# Patient Record
Sex: Female | Born: 1945 | Race: White | Hispanic: No | Marital: Married | Smoking: Never smoker
Health system: Southern US, Community
[De-identification: ages and names within clinical notes are randomized; demographics above are authoritative.]

## PROBLEM LIST (undated history)

## (undated) DIAGNOSIS — F32A Depression, unspecified: Secondary | ICD-10-CM

## (undated) DIAGNOSIS — Z5189 Encounter for other specified aftercare: Secondary | ICD-10-CM

## (undated) DIAGNOSIS — G4733 Obstructive sleep apnea (adult) (pediatric): Secondary | ICD-10-CM

## (undated) DIAGNOSIS — T7840XA Allergy, unspecified, initial encounter: Secondary | ICD-10-CM

## (undated) DIAGNOSIS — K219 Gastro-esophageal reflux disease without esophagitis: Secondary | ICD-10-CM

## (undated) DIAGNOSIS — L910 Hypertrophic scar: Secondary | ICD-10-CM

## (undated) DIAGNOSIS — I480 Paroxysmal atrial fibrillation: Secondary | ICD-10-CM

## (undated) DIAGNOSIS — E669 Obesity, unspecified: Secondary | ICD-10-CM

## (undated) DIAGNOSIS — I2510 Atherosclerotic heart disease of native coronary artery without angina pectoris: Secondary | ICD-10-CM

## (undated) DIAGNOSIS — I10 Essential (primary) hypertension: Secondary | ICD-10-CM

## (undated) DIAGNOSIS — G4730 Sleep apnea, unspecified: Secondary | ICD-10-CM

## (undated) DIAGNOSIS — E785 Hyperlipidemia, unspecified: Secondary | ICD-10-CM

## (undated) DIAGNOSIS — K5010 Crohn's disease of large intestine without complications: Secondary | ICD-10-CM

## (undated) DIAGNOSIS — K5190 Ulcerative colitis, unspecified, without complications: Secondary | ICD-10-CM

## (undated) DIAGNOSIS — D3000 Benign neoplasm of unspecified kidney: Secondary | ICD-10-CM

## (undated) DIAGNOSIS — K859 Acute pancreatitis without necrosis or infection, unspecified: Secondary | ICD-10-CM

## (undated) DIAGNOSIS — F419 Anxiety disorder, unspecified: Secondary | ICD-10-CM

## (undated) DIAGNOSIS — M1990 Unspecified osteoarthritis, unspecified site: Secondary | ICD-10-CM

## (undated) DIAGNOSIS — K5790 Diverticulosis of intestine, part unspecified, without perforation or abscess without bleeding: Secondary | ICD-10-CM

## (undated) DIAGNOSIS — F329 Major depressive disorder, single episode, unspecified: Secondary | ICD-10-CM

## (undated) DIAGNOSIS — K589 Irritable bowel syndrome without diarrhea: Secondary | ICD-10-CM

## (undated) DIAGNOSIS — I724 Aneurysm of artery of lower extremity: Secondary | ICD-10-CM

## (undated) DIAGNOSIS — M069 Rheumatoid arthritis, unspecified: Secondary | ICD-10-CM

## (undated) HISTORY — DX: Crohn's disease of large intestine without complications: K50.10

## (undated) HISTORY — PX: CORONARY ANGIOPLASTY: SHX604

## (undated) HISTORY — DX: Essential (primary) hypertension: I10

## (undated) HISTORY — DX: Ulcerative colitis, unspecified, without complications: K51.90

## (undated) HISTORY — PX: ESOPHAGOGASTRODUODENOSCOPY: SHX1529

## (undated) HISTORY — DX: Irritable bowel syndrome, unspecified: K58.9

## (undated) HISTORY — DX: Diverticulosis of intestine, part unspecified, without perforation or abscess without bleeding: K57.90

## (undated) HISTORY — DX: Obesity, unspecified: E66.9

## (undated) HISTORY — DX: Hypertrophic scar: L91.0

## (undated) HISTORY — DX: Depression, unspecified: F32.A

## (undated) HISTORY — DX: Rheumatoid arthritis, unspecified: M06.9

## (undated) HISTORY — DX: Paroxysmal atrial fibrillation: I48.0

## (undated) HISTORY — DX: Benign neoplasm of unspecified kidney: D30.00

## (undated) HISTORY — DX: Gastro-esophageal reflux disease without esophagitis: K21.9

## (undated) HISTORY — PX: ANGIOPLASTY: SHX39

## (undated) HISTORY — DX: Unspecified osteoarthritis, unspecified site: M19.90

## (undated) HISTORY — DX: Hyperlipidemia, unspecified: E78.5

## (undated) HISTORY — PX: TUBAL LIGATION: SHX77

## (undated) HISTORY — DX: Allergy, unspecified, initial encounter: T78.40XA

## (undated) HISTORY — DX: Acute pancreatitis without necrosis or infection, unspecified: K85.90

## (undated) HISTORY — DX: Sleep apnea, unspecified: G47.30

## (undated) HISTORY — DX: Atherosclerotic heart disease of native coronary artery without angina pectoris: I25.10

## (undated) HISTORY — DX: Encounter for other specified aftercare: Z51.89

## (undated) HISTORY — PX: COLONOSCOPY W/ BIOPSIES: SHX1374

## (undated) HISTORY — DX: Anxiety disorder, unspecified: F41.9

## (undated) HISTORY — DX: Obstructive sleep apnea (adult) (pediatric): G47.33

## (undated) HISTORY — DX: Major depressive disorder, single episode, unspecified: F32.9

## (undated) HISTORY — DX: Aneurysm of artery of lower extremity: I72.4

---

## 1974-10-24 HISTORY — PX: OTHER SURGICAL HISTORY: SHX169

## 1994-10-24 HISTORY — PX: BREAST BIOPSY: SHX20

## 2006-10-24 DIAGNOSIS — I4891 Unspecified atrial fibrillation: Secondary | ICD-10-CM

## 2006-10-24 HISTORY — DX: Unspecified atrial fibrillation: I48.91

## 2006-10-24 HISTORY — PX: NEPHRECTOMY: SHX65

## 2006-10-24 HISTORY — PX: CHOLECYSTECTOMY: SHX55

## 2007-01-04 ENCOUNTER — Encounter: Payer: Self-pay | Admitting: Pulmonary Disease

## 2007-01-29 ENCOUNTER — Inpatient Hospital Stay (HOSPITAL_COMMUNITY): Admission: EM | Admit: 2007-01-29 | Discharge: 2007-01-31 | Payer: Self-pay | Admitting: Emergency Medicine

## 2007-01-29 ENCOUNTER — Ambulatory Visit: Payer: Self-pay | Admitting: Cardiology

## 2007-02-06 ENCOUNTER — Ambulatory Visit: Payer: Self-pay | Admitting: Cardiology

## 2007-02-06 ENCOUNTER — Observation Stay (HOSPITAL_COMMUNITY): Admission: EM | Admit: 2007-02-06 | Discharge: 2007-02-07 | Payer: Self-pay | Admitting: Emergency Medicine

## 2007-02-07 ENCOUNTER — Encounter: Payer: Self-pay | Admitting: Internal Medicine

## 2007-02-21 ENCOUNTER — Ambulatory Visit: Payer: Self-pay | Admitting: Cardiovascular Disease

## 2007-02-22 HISTORY — PX: OTHER SURGICAL HISTORY: SHX169

## 2007-02-27 ENCOUNTER — Ambulatory Visit: Payer: Self-pay | Admitting: Internal Medicine

## 2007-03-01 ENCOUNTER — Ambulatory Visit: Payer: Self-pay | Admitting: Internal Medicine

## 2007-03-06 ENCOUNTER — Encounter: Payer: Self-pay | Admitting: Cardiology

## 2007-03-06 ENCOUNTER — Ambulatory Visit: Payer: Self-pay | Admitting: Cardiology

## 2007-03-06 ENCOUNTER — Inpatient Hospital Stay (HOSPITAL_COMMUNITY): Admission: EM | Admit: 2007-03-06 | Discharge: 2007-03-26 | Payer: Self-pay | Admitting: Emergency Medicine

## 2007-03-10 ENCOUNTER — Encounter: Payer: Self-pay | Admitting: Cardiology

## 2007-03-12 ENCOUNTER — Ambulatory Visit: Payer: Self-pay | Admitting: Vascular Surgery

## 2007-03-13 ENCOUNTER — Encounter: Payer: Self-pay | Admitting: Vascular Surgery

## 2007-03-30 ENCOUNTER — Ambulatory Visit: Payer: Self-pay | Admitting: Vascular Surgery

## 2007-04-05 ENCOUNTER — Ambulatory Visit: Payer: Self-pay | Admitting: Cardiovascular Disease

## 2007-04-05 ENCOUNTER — Ambulatory Visit: Payer: Self-pay | Admitting: Internal Medicine

## 2007-04-06 ENCOUNTER — Ambulatory Visit: Payer: Self-pay | Admitting: Vascular Surgery

## 2007-04-13 ENCOUNTER — Ambulatory Visit: Payer: Self-pay | Admitting: Vascular Surgery

## 2007-04-20 ENCOUNTER — Ambulatory Visit: Payer: Self-pay | Admitting: Vascular Surgery

## 2007-05-04 ENCOUNTER — Ambulatory Visit: Payer: Self-pay | Admitting: Vascular Surgery

## 2007-06-06 ENCOUNTER — Ambulatory Visit: Payer: Self-pay | Admitting: Vascular Surgery

## 2007-06-07 ENCOUNTER — Ambulatory Visit: Payer: Self-pay | Admitting: Internal Medicine

## 2007-06-07 LAB — CONVERTED CEMR LAB
Anti Nuclear Antibody(ANA): NEGATIVE
Rhuematoid fact SerPl-aCnc: <20 intl units/mL — ABNORMAL LOW (ref 0.0–20.0)
Uric Acid, Serum: 6.1 mg/dL (ref 2.4–7.0)

## 2007-06-21 ENCOUNTER — Ambulatory Visit: Payer: Self-pay | Admitting: Cardiovascular Disease

## 2007-06-28 ENCOUNTER — Ambulatory Visit: Payer: Self-pay | Admitting: Internal Medicine

## 2007-06-28 LAB — CONVERTED CEMR LAB
Basophils Relative: 0.2 % (ref 0.0–1.0)
Monocytes Relative: 11.6 % — ABNORMAL HIGH (ref 3.0–11.0)
Platelets: 272 10*3/uL (ref 150–400)
RDW: 12.8 % (ref 11.5–14.6)
WBC: 7 10*3/uL (ref 4.5–10.5)

## 2007-07-02 ENCOUNTER — Ambulatory Visit: Payer: Self-pay | Admitting: Cardiovascular Disease

## 2007-07-02 LAB — CONVERTED CEMR LAB
ALT: 15 units/L (ref 0–35)
Bilirubin, Direct: 0.1 mg/dL (ref 0.0–0.3)
Cholesterol: 153 mg/dL (ref 0–200)
HDL: 33.9 mg/dL — ABNORMAL LOW (ref >39.0)
LDL Cholesterol: 105 mg/dL — ABNORMAL HIGH (ref 0–99)
Total CHOL/HDL Ratio: 4.5
Total Protein: 6.8 g/dL (ref 6.0–8.3)
Triglycerides: 70 mg/dL (ref 0–149)

## 2007-07-04 ENCOUNTER — Encounter: Payer: Self-pay | Admitting: Internal Medicine

## 2007-07-04 ENCOUNTER — Ambulatory Visit (HOSPITAL_COMMUNITY): Admission: RE | Admit: 2007-07-04 | Discharge: 2007-07-04 | Payer: Self-pay | Admitting: Internal Medicine

## 2007-07-04 DIAGNOSIS — Z860100 Personal history of colon polyps, unspecified: Secondary | ICD-10-CM | POA: Insufficient documentation

## 2007-07-09 ENCOUNTER — Ambulatory Visit: Payer: Self-pay | Admitting: Internal Medicine

## 2007-08-01 ENCOUNTER — Ambulatory Visit: Payer: Self-pay | Admitting: Internal Medicine

## 2007-09-03 ENCOUNTER — Telehealth: Payer: Self-pay | Admitting: Internal Medicine

## 2007-09-05 ENCOUNTER — Telehealth (INDEPENDENT_AMBULATORY_CARE_PROVIDER_SITE_OTHER): Payer: Self-pay | Admitting: *Deleted

## 2007-09-06 ENCOUNTER — Ambulatory Visit: Payer: Self-pay | Admitting: Internal Medicine

## 2007-09-06 DIAGNOSIS — K219 Gastro-esophageal reflux disease without esophagitis: Secondary | ICD-10-CM

## 2007-09-06 DIAGNOSIS — I2510 Atherosclerotic heart disease of native coronary artery without angina pectoris: Secondary | ICD-10-CM | POA: Insufficient documentation

## 2007-09-06 DIAGNOSIS — F329 Major depressive disorder, single episode, unspecified: Secondary | ICD-10-CM

## 2007-09-06 DIAGNOSIS — J309 Allergic rhinitis, unspecified: Secondary | ICD-10-CM | POA: Insufficient documentation

## 2007-09-06 DIAGNOSIS — F341 Dysthymic disorder: Secondary | ICD-10-CM | POA: Insufficient documentation

## 2007-09-06 DIAGNOSIS — I252 Old myocardial infarction: Secondary | ICD-10-CM

## 2007-09-06 DIAGNOSIS — E785 Hyperlipidemia, unspecified: Secondary | ICD-10-CM | POA: Insufficient documentation

## 2007-09-06 DIAGNOSIS — I10 Essential (primary) hypertension: Secondary | ICD-10-CM | POA: Insufficient documentation

## 2007-09-06 DIAGNOSIS — H9190 Unspecified hearing loss, unspecified ear: Secondary | ICD-10-CM

## 2007-09-06 DIAGNOSIS — J45909 Unspecified asthma, uncomplicated: Secondary | ICD-10-CM | POA: Insufficient documentation

## 2007-09-24 ENCOUNTER — Ambulatory Visit: Payer: Self-pay | Admitting: Internal Medicine

## 2007-09-27 ENCOUNTER — Encounter: Payer: Self-pay | Admitting: Internal Medicine

## 2007-10-08 ENCOUNTER — Encounter: Payer: Self-pay | Admitting: Internal Medicine

## 2007-11-05 ENCOUNTER — Ambulatory Visit: Payer: Self-pay | Admitting: Internal Medicine

## 2007-11-05 DIAGNOSIS — M810 Age-related osteoporosis without current pathological fracture: Secondary | ICD-10-CM | POA: Insufficient documentation

## 2007-12-04 ENCOUNTER — Encounter: Payer: Self-pay | Admitting: Internal Medicine

## 2008-01-03 DIAGNOSIS — G4733 Obstructive sleep apnea (adult) (pediatric): Secondary | ICD-10-CM

## 2008-01-03 DIAGNOSIS — E669 Obesity, unspecified: Secondary | ICD-10-CM

## 2008-01-03 DIAGNOSIS — E66813 Obesity, class 3: Secondary | ICD-10-CM | POA: Insufficient documentation

## 2008-02-11 ENCOUNTER — Ambulatory Visit: Payer: Self-pay | Admitting: Internal Medicine

## 2008-02-27 ENCOUNTER — Ambulatory Visit: Payer: Self-pay | Admitting: Internal Medicine

## 2008-02-27 LAB — CONVERTED CEMR LAB
CO2: 31 meq/L (ref 19–32)
Calcium: 9.4 mg/dL (ref 8.4–10.5)
Cholesterol: 177 mg/dL (ref 0–200)
Glucose, Bld: 102 mg/dL — ABNORMAL HIGH (ref 70–99)
HDL: 33.8 mg/dL — ABNORMAL LOW (ref >39.0)
Sodium: 143 meq/L (ref 135–145)
Triglycerides: 72 mg/dL (ref 0–149)
Vit D, 1,25-Dihydroxy: 23 — ABNORMAL LOW (ref 30–89)

## 2008-03-03 ENCOUNTER — Ambulatory Visit: Payer: Self-pay | Admitting: Internal Medicine

## 2008-07-16 ENCOUNTER — Encounter: Payer: Self-pay | Admitting: Internal Medicine

## 2008-11-06 ENCOUNTER — Ambulatory Visit: Payer: Self-pay | Admitting: Internal Medicine

## 2008-11-06 LAB — CONVERTED CEMR LAB
AST: 20 units/L (ref 0–37)
HDL: 35.2 mg/dL — ABNORMAL LOW (ref >39.0)
VLDL: 19 mg/dL (ref 0–40)
Vit D, 1,25-Dihydroxy: 35 (ref 30–89)

## 2008-11-12 ENCOUNTER — Ambulatory Visit: Payer: Self-pay | Admitting: Internal Medicine

## 2008-11-12 DIAGNOSIS — R635 Abnormal weight gain: Secondary | ICD-10-CM | POA: Insufficient documentation

## 2008-12-12 ENCOUNTER — Ambulatory Visit: Payer: Self-pay | Admitting: *Deleted

## 2008-12-12 DIAGNOSIS — IMO0002 Reserved for concepts with insufficient information to code with codable children: Secondary | ICD-10-CM

## 2008-12-15 ENCOUNTER — Telehealth: Payer: Self-pay | Admitting: Internal Medicine

## 2008-12-16 ENCOUNTER — Ambulatory Visit: Payer: Self-pay | Admitting: Internal Medicine

## 2008-12-16 LAB — CONVERTED CEMR LAB
Basophils Absolute: 0 10*3/uL (ref 0.0–0.1)
Basophils Relative: 0.3 % (ref 0.0–3.0)
Calcium: 9.7 mg/dL (ref 8.4–10.5)
Creatinine, Ser: 1.2 mg/dL (ref 0.4–1.2)
Eosinophils Absolute: 0 10*3/uL (ref 0.0–0.7)
GFR calc Af Amer: 58 mL/min
GFR calc non Af Amer: 48 mL/min
Hemoglobin: 13.7 g/dL (ref 12.0–15.0)
MCHC: 33.7 g/dL (ref 30.0–36.0)
MCV: 94.1 fL (ref 78.0–100.0)
Monocytes Absolute: 0.2 10*3/uL (ref 0.1–1.0)
Neutro Abs: 9.5 10*3/uL — ABNORMAL HIGH (ref 1.4–7.7)
Neutrophils Relative %: 85.4 % — ABNORMAL HIGH (ref 43.0–77.0)
RBC: 4.31 M/uL (ref 3.87–5.11)
Sed Rate: 53 mm/hr — ABNORMAL HIGH (ref 0–22)

## 2008-12-17 ENCOUNTER — Encounter: Payer: Self-pay | Admitting: Physician Assistant

## 2008-12-23 ENCOUNTER — Telehealth: Payer: Self-pay | Admitting: Internal Medicine

## 2009-01-07 ENCOUNTER — Ambulatory Visit: Payer: Self-pay | Admitting: Internal Medicine

## 2009-01-07 DIAGNOSIS — M545 Low back pain, unspecified: Secondary | ICD-10-CM | POA: Insufficient documentation

## 2009-01-07 DIAGNOSIS — H209 Unspecified iridocyclitis: Secondary | ICD-10-CM

## 2009-01-07 LAB — CONVERTED CEMR LAB
Bilirubin, Direct: 0.2 mg/dL (ref 0.0–0.3)
Calcium: 8.9 mg/dL (ref 8.4–10.5)
Cholesterol: 183 mg/dL (ref 0–200)
Creatinine, Ser: 1 mg/dL (ref 0.4–1.2)
HDL: 39.1 mg/dL (ref >39.00)
LDL Cholesterol: 121 mg/dL — ABNORMAL HIGH (ref 0–99)
Total Bilirubin: 1 mg/dL (ref 0.3–1.2)
Total CHOL/HDL Ratio: 5
Triglycerides: 113 mg/dL (ref 0.0–149.0)

## 2009-01-12 ENCOUNTER — Telehealth: Payer: Self-pay | Admitting: Internal Medicine

## 2009-01-15 ENCOUNTER — Observation Stay (HOSPITAL_COMMUNITY): Admission: AD | Admit: 2009-01-15 | Discharge: 2009-01-16 | Payer: Self-pay | Admitting: Internal Medicine

## 2009-01-15 ENCOUNTER — Ambulatory Visit: Payer: Self-pay | Admitting: Internal Medicine

## 2009-01-15 ENCOUNTER — Ambulatory Visit: Payer: Self-pay | Admitting: Cardiology

## 2009-01-15 DIAGNOSIS — I4891 Unspecified atrial fibrillation: Secondary | ICD-10-CM | POA: Insufficient documentation

## 2009-01-19 ENCOUNTER — Ambulatory Visit: Payer: Self-pay | Admitting: Cardiology

## 2009-01-19 ENCOUNTER — Ambulatory Visit: Payer: Self-pay

## 2009-01-19 ENCOUNTER — Encounter: Payer: Self-pay | Admitting: Cardiology

## 2009-01-28 ENCOUNTER — Encounter: Payer: Self-pay | Admitting: Cardiology

## 2009-01-28 ENCOUNTER — Ambulatory Visit: Payer: Self-pay | Admitting: Cardiology

## 2009-02-02 ENCOUNTER — Telehealth: Payer: Self-pay | Admitting: Cardiology

## 2009-02-06 ENCOUNTER — Encounter: Payer: Self-pay | Admitting: Cardiology

## 2009-02-09 ENCOUNTER — Ambulatory Visit: Payer: Self-pay | Admitting: Internal Medicine

## 2009-02-09 DIAGNOSIS — R740 Nonspecific elevation of levels of transaminase and lactic acid dehydrogenase [LDH]: Secondary | ICD-10-CM

## 2009-02-10 ENCOUNTER — Telehealth: Payer: Self-pay | Admitting: Internal Medicine

## 2009-02-13 ENCOUNTER — Ambulatory Visit: Payer: Self-pay | Admitting: Internal Medicine

## 2009-02-13 LAB — CONVERTED CEMR LAB: Prothrombin Time: 86.6 s (ref 10.9–13.3)

## 2009-02-14 ENCOUNTER — Telehealth: Payer: Self-pay | Admitting: Family Medicine

## 2009-02-14 ENCOUNTER — Encounter: Payer: Self-pay | Admitting: Internal Medicine

## 2009-02-14 ENCOUNTER — Ambulatory Visit: Payer: Self-pay | Admitting: Family Medicine

## 2009-02-14 LAB — CONVERTED CEMR LAB
INR: 5.5 (ref 0.0–1.5)
Prothrombin Time: 55.4 s — ABNORMAL HIGH (ref 11.6–15.2)

## 2009-02-16 ENCOUNTER — Telehealth: Payer: Self-pay | Admitting: Internal Medicine

## 2009-02-16 ENCOUNTER — Ambulatory Visit: Payer: Self-pay | Admitting: Internal Medicine

## 2009-02-16 LAB — CONVERTED CEMR LAB: Prothrombin Time: 46.5 s — ABNORMAL HIGH (ref 10.9–13.3)

## 2009-02-20 ENCOUNTER — Telehealth: Payer: Self-pay | Admitting: Internal Medicine

## 2009-02-20 ENCOUNTER — Ambulatory Visit: Payer: Self-pay | Admitting: Internal Medicine

## 2009-02-20 ENCOUNTER — Ambulatory Visit: Payer: Self-pay | Admitting: Cardiology

## 2009-02-20 LAB — CONVERTED CEMR LAB: INR: 1.7 — ABNORMAL HIGH (ref 0.8–1.0)

## 2009-02-23 ENCOUNTER — Ambulatory Visit: Payer: Self-pay | Admitting: Internal Medicine

## 2009-02-23 ENCOUNTER — Telehealth: Payer: Self-pay | Admitting: Internal Medicine

## 2009-02-23 LAB — CONVERTED CEMR LAB
Basophils Relative: 0.9 % (ref 0.0–3.0)
Eosinophils Absolute: 0.1 10*3/uL (ref 0.0–0.7)
Eosinophils Relative: 0.6 % (ref 0.0–5.0)
HCT: 42.7 % (ref 36.0–46.0)
Lymphs Abs: 2 10*3/uL (ref 0.7–4.0)
MCHC: 33.2 g/dL (ref 30.0–36.0)
MCV: 95.8 fL (ref 78.0–100.0)
Monocytes Absolute: 0.6 10*3/uL (ref 0.1–1.0)
Neutro Abs: 5.8 10*3/uL (ref 1.4–7.7)
Neutrophils Relative %: 68.1 % (ref 43.0–77.0)
RBC: 4.46 M/uL (ref 3.87–5.11)

## 2009-02-24 ENCOUNTER — Ambulatory Visit: Payer: Self-pay | Admitting: Diagnostic Radiology

## 2009-02-24 ENCOUNTER — Ambulatory Visit (HOSPITAL_BASED_OUTPATIENT_CLINIC_OR_DEPARTMENT_OTHER): Admission: RE | Admit: 2009-02-24 | Discharge: 2009-02-24 | Payer: Self-pay | Admitting: Internal Medicine

## 2009-02-24 ENCOUNTER — Telehealth: Payer: Self-pay | Admitting: Internal Medicine

## 2009-02-24 DIAGNOSIS — R51 Headache: Secondary | ICD-10-CM | POA: Insufficient documentation

## 2009-02-24 DIAGNOSIS — R519 Headache, unspecified: Secondary | ICD-10-CM | POA: Insufficient documentation

## 2009-02-25 ENCOUNTER — Ambulatory Visit: Payer: Self-pay | Admitting: Cardiovascular Disease

## 2009-02-26 ENCOUNTER — Ambulatory Visit: Payer: Self-pay | Admitting: Cardiology

## 2009-02-26 ENCOUNTER — Inpatient Hospital Stay (HOSPITAL_COMMUNITY): Admission: EM | Admit: 2009-02-26 | Discharge: 2009-02-28 | Payer: Self-pay | Admitting: Emergency Medicine

## 2009-02-26 ENCOUNTER — Ambulatory Visit: Payer: Self-pay | Admitting: Internal Medicine

## 2009-02-27 ENCOUNTER — Encounter: Payer: Self-pay | Admitting: Internal Medicine

## 2009-03-04 ENCOUNTER — Encounter: Payer: Self-pay | Admitting: Cardiology

## 2009-03-04 ENCOUNTER — Ambulatory Visit: Payer: Self-pay | Admitting: Cardiology

## 2009-03-11 ENCOUNTER — Ambulatory Visit: Payer: Self-pay | Admitting: Internal Medicine

## 2009-03-11 ENCOUNTER — Ambulatory Visit: Payer: Self-pay | Admitting: Cardiology

## 2009-03-18 ENCOUNTER — Ambulatory Visit: Payer: Self-pay | Admitting: Internal Medicine

## 2009-03-25 ENCOUNTER — Encounter: Payer: Self-pay | Admitting: *Deleted

## 2009-03-26 ENCOUNTER — Ambulatory Visit: Payer: Self-pay | Admitting: Cardiology

## 2009-03-26 LAB — CONVERTED CEMR LAB: POC INR: 2.3

## 2009-03-30 ENCOUNTER — Telehealth (INDEPENDENT_AMBULATORY_CARE_PROVIDER_SITE_OTHER): Payer: Self-pay | Admitting: *Deleted

## 2009-04-13 ENCOUNTER — Ambulatory Visit: Payer: Self-pay | Admitting: Internal Medicine

## 2009-04-14 LAB — CONVERTED CEMR LAB
ALT: 18 units/L (ref 0–35)
AST: 22 units/L (ref 0–37)
Albumin: 3.2 g/dL — ABNORMAL LOW (ref 3.5–5.2)
Alkaline Phosphatase: 85 units/L (ref 39–117)
Total Protein: 6.9 g/dL (ref 6.0–8.3)

## 2009-04-16 ENCOUNTER — Ambulatory Visit: Payer: Self-pay | Admitting: Cardiology

## 2009-04-16 ENCOUNTER — Encounter: Payer: Self-pay | Admitting: *Deleted

## 2009-04-23 ENCOUNTER — Ambulatory Visit: Payer: Self-pay | Admitting: Internal Medicine

## 2009-04-23 DIAGNOSIS — J019 Acute sinusitis, unspecified: Secondary | ICD-10-CM

## 2009-04-24 ENCOUNTER — Telehealth: Payer: Self-pay | Admitting: Internal Medicine

## 2009-04-28 ENCOUNTER — Telehealth: Payer: Self-pay | Admitting: Internal Medicine

## 2009-04-28 DIAGNOSIS — T17308A Unspecified foreign body in larynx causing other injury, initial encounter: Secondary | ICD-10-CM | POA: Insufficient documentation

## 2009-04-29 ENCOUNTER — Emergency Department (HOSPITAL_COMMUNITY): Admission: EM | Admit: 2009-04-29 | Discharge: 2009-04-29 | Payer: Self-pay | Admitting: Emergency Medicine

## 2009-04-29 ENCOUNTER — Encounter: Payer: Self-pay | Admitting: *Deleted

## 2009-05-01 ENCOUNTER — Ambulatory Visit: Payer: Self-pay | Admitting: Internal Medicine

## 2009-05-06 ENCOUNTER — Telehealth (INDEPENDENT_AMBULATORY_CARE_PROVIDER_SITE_OTHER): Payer: Self-pay | Admitting: *Deleted

## 2009-05-20 ENCOUNTER — Ambulatory Visit: Payer: Self-pay | Admitting: Pulmonary Disease

## 2009-05-20 DIAGNOSIS — R05 Cough: Secondary | ICD-10-CM

## 2009-05-25 ENCOUNTER — Telehealth: Payer: Self-pay | Admitting: Cardiology

## 2009-06-01 ENCOUNTER — Encounter (INDEPENDENT_AMBULATORY_CARE_PROVIDER_SITE_OTHER): Payer: Self-pay | Admitting: *Deleted

## 2009-06-03 ENCOUNTER — Encounter (INDEPENDENT_AMBULATORY_CARE_PROVIDER_SITE_OTHER): Payer: Self-pay | Admitting: *Deleted

## 2009-06-03 ENCOUNTER — Ambulatory Visit: Payer: Self-pay | Admitting: Pulmonary Disease

## 2009-06-08 ENCOUNTER — Encounter (INDEPENDENT_AMBULATORY_CARE_PROVIDER_SITE_OTHER): Payer: Self-pay | Admitting: *Deleted

## 2009-06-17 ENCOUNTER — Encounter: Payer: Self-pay | Admitting: Internal Medicine

## 2009-07-01 ENCOUNTER — Encounter: Payer: Self-pay | Admitting: *Deleted

## 2009-07-02 ENCOUNTER — Ambulatory Visit: Payer: Self-pay | Admitting: Internal Medicine

## 2009-07-02 DIAGNOSIS — R739 Hyperglycemia, unspecified: Secondary | ICD-10-CM | POA: Insufficient documentation

## 2009-07-02 LAB — CONVERTED CEMR LAB
Basophils Absolute: 0 10*3/uL (ref 0.0–0.1)
Basophils Relative: 0 % (ref 0–1)
Calcium: 9.6 mg/dL (ref 8.4–10.5)
Glucose, Bld: 94 mg/dL (ref 70–99)
Hgb A1c MFr Bld: 5.9 % (ref 4.6–6.1)
Lymphocytes Relative: 29 % (ref 12–46)
MCHC: 33.1 g/dL (ref 30.0–36.0)
Monocytes Absolute: 0.7 10*3/uL (ref 0.1–1.0)
Neutro Abs: 5 10*3/uL (ref 1.7–7.7)
Neutrophils Relative %: 58 % (ref 43–77)
Platelets: 223 10*3/uL (ref 150–400)
Potassium: 4.3 meq/L (ref 3.5–5.3)
RDW: 13.3 % (ref 11.5–15.5)
Sodium: 143 meq/L (ref 135–145)

## 2009-07-03 ENCOUNTER — Encounter: Payer: Self-pay | Admitting: Internal Medicine

## 2009-07-13 ENCOUNTER — Telehealth: Payer: Self-pay | Admitting: Pulmonary Disease

## 2009-07-13 ENCOUNTER — Telehealth: Payer: Self-pay | Admitting: Internal Medicine

## 2009-07-15 ENCOUNTER — Ambulatory Visit: Payer: Self-pay | Admitting: Internal Medicine

## 2009-08-24 ENCOUNTER — Ambulatory Visit: Payer: Self-pay | Admitting: Internal Medicine

## 2009-08-28 ENCOUNTER — Ambulatory Visit: Payer: Self-pay | Admitting: Cardiology

## 2009-09-29 ENCOUNTER — Ambulatory Visit: Payer: Self-pay | Admitting: Internal Medicine

## 2009-09-29 LAB — CONVERTED CEMR LAB
CO2: 24 meq/L (ref 19–32)
Calcium: 9.1 mg/dL (ref 8.4–10.5)
Chloride: 104 meq/L (ref 96–112)
Creatinine, Ser: 1.19 mg/dL (ref 0.40–1.20)
Sodium: 141 meq/L (ref 135–145)

## 2009-10-05 ENCOUNTER — Ambulatory Visit: Payer: Self-pay | Admitting: Internal Medicine

## 2009-10-05 ENCOUNTER — Encounter: Payer: Self-pay | Admitting: Pulmonary Disease

## 2009-10-06 ENCOUNTER — Telehealth: Payer: Self-pay | Admitting: Internal Medicine

## 2009-10-07 ENCOUNTER — Telehealth: Payer: Self-pay | Admitting: Cardiology

## 2009-11-04 ENCOUNTER — Encounter (INDEPENDENT_AMBULATORY_CARE_PROVIDER_SITE_OTHER): Payer: Self-pay | Admitting: *Deleted

## 2009-11-12 ENCOUNTER — Encounter (INDEPENDENT_AMBULATORY_CARE_PROVIDER_SITE_OTHER): Payer: Self-pay | Admitting: *Deleted

## 2009-11-16 ENCOUNTER — Ambulatory Visit: Payer: Self-pay | Admitting: Internal Medicine

## 2009-11-30 ENCOUNTER — Ambulatory Visit: Payer: Self-pay | Admitting: Internal Medicine

## 2009-11-30 LAB — HM COLONOSCOPY

## 2009-12-01 ENCOUNTER — Encounter: Payer: Self-pay | Admitting: Internal Medicine

## 2009-12-09 ENCOUNTER — Telehealth: Payer: Self-pay | Admitting: Cardiology

## 2009-12-09 ENCOUNTER — Telehealth: Payer: Self-pay | Admitting: Internal Medicine

## 2009-12-10 ENCOUNTER — Ambulatory Visit: Payer: Self-pay | Admitting: Cardiology

## 2009-12-10 DIAGNOSIS — R0989 Other specified symptoms and signs involving the circulatory and respiratory systems: Secondary | ICD-10-CM | POA: Insufficient documentation

## 2009-12-14 ENCOUNTER — Ambulatory Visit: Payer: Self-pay | Admitting: Internal Medicine

## 2009-12-14 DIAGNOSIS — M79609 Pain in unspecified limb: Secondary | ICD-10-CM

## 2009-12-17 ENCOUNTER — Ambulatory Visit: Payer: Self-pay

## 2009-12-17 ENCOUNTER — Encounter: Payer: Self-pay | Admitting: Cardiology

## 2009-12-21 ENCOUNTER — Ambulatory Visit: Payer: Self-pay | Admitting: Pulmonary Disease

## 2010-01-04 ENCOUNTER — Telehealth: Payer: Self-pay | Admitting: Internal Medicine

## 2010-01-20 ENCOUNTER — Encounter: Payer: Self-pay | Admitting: Internal Medicine

## 2010-01-28 ENCOUNTER — Encounter (INDEPENDENT_AMBULATORY_CARE_PROVIDER_SITE_OTHER): Payer: Self-pay | Admitting: *Deleted

## 2010-02-02 ENCOUNTER — Ambulatory Visit: Payer: Self-pay | Admitting: Cardiology

## 2010-02-02 LAB — CONVERTED CEMR LAB
BUN: 20 mg/dL (ref 6–23)
Chloride: 105 meq/L (ref 96–112)
GFR calc non Af Amer: 40.18 mL/min (ref >60)
Glucose, Bld: 94 mg/dL (ref 70–99)
Potassium: 4.4 meq/L (ref 3.5–5.1)
Sodium: 144 meq/L (ref 135–145)

## 2010-03-08 ENCOUNTER — Ambulatory Visit: Payer: Self-pay | Admitting: Internal Medicine

## 2010-03-08 DIAGNOSIS — N259 Disorder resulting from impaired renal tubular function, unspecified: Secondary | ICD-10-CM

## 2010-03-08 LAB — CONVERTED CEMR LAB
CO2: 26 meq/L (ref 19–32)
Chloride: 102 meq/L (ref 96–112)
Creatinine, Ser: 1.36 mg/dL — ABNORMAL HIGH (ref 0.40–1.20)
Sodium: 138 meq/L (ref 135–145)

## 2010-03-09 ENCOUNTER — Telehealth: Payer: Self-pay | Admitting: Internal Medicine

## 2010-04-14 ENCOUNTER — Encounter: Payer: Self-pay | Admitting: Internal Medicine

## 2010-04-15 ENCOUNTER — Encounter: Payer: Self-pay | Admitting: Internal Medicine

## 2010-05-14 ENCOUNTER — Telehealth: Payer: Self-pay | Admitting: Internal Medicine

## 2010-06-04 ENCOUNTER — Ambulatory Visit: Payer: Self-pay | Admitting: Cardiology

## 2010-06-07 ENCOUNTER — Encounter: Payer: Self-pay | Admitting: Internal Medicine

## 2010-07-06 ENCOUNTER — Ambulatory Visit: Payer: Self-pay | Admitting: Internal Medicine

## 2010-07-06 DIAGNOSIS — H9209 Otalgia, unspecified ear: Secondary | ICD-10-CM | POA: Insufficient documentation

## 2010-08-27 ENCOUNTER — Telehealth: Payer: Self-pay | Admitting: Cardiology

## 2010-08-27 ENCOUNTER — Telehealth: Payer: Self-pay | Admitting: Internal Medicine

## 2010-09-06 ENCOUNTER — Telehealth: Payer: Self-pay | Admitting: Cardiology

## 2010-09-06 ENCOUNTER — Encounter: Payer: Self-pay | Admitting: Internal Medicine

## 2010-09-06 LAB — CONVERTED CEMR LAB
BUN: 21 mg/dL (ref 6–23)
CO2: 27 meq/L (ref 19–32)
Chloride: 103 meq/L (ref 96–112)
Potassium: 4.6 meq/L (ref 3.5–5.3)

## 2010-09-09 ENCOUNTER — Ambulatory Visit: Payer: Self-pay | Admitting: Cardiology

## 2010-09-09 ENCOUNTER — Encounter: Payer: Self-pay | Admitting: Cardiology

## 2010-09-13 ENCOUNTER — Ambulatory Visit: Payer: Self-pay | Admitting: Internal Medicine

## 2010-10-12 ENCOUNTER — Telehealth: Payer: Self-pay | Admitting: Cardiology

## 2010-11-08 ENCOUNTER — Ambulatory Visit
Admission: RE | Admit: 2010-11-08 | Discharge: 2010-11-08 | Payer: Self-pay | Source: Home / Self Care | Attending: Internal Medicine | Admitting: Internal Medicine

## 2010-11-23 NOTE — Progress Notes (Signed)
Summary: lab results  Phone Note Outgoing Call   Summary of Call: call pt - kidney function is stable.  I advise pt take 1/2 of hctz daily Initial call taken by: D. Thomos Lemons DO,  Mar 09, 2010 7:08 AM  Follow-up for Phone Call        Left message on machine to return my call.  Mervin Kung CMA  Mar 09, 2010 8:23 AM   Advised pt. of Dr. Olegario Messier instructions.  Pt states she is not sure if she will be able to cut HCTZ in half. Advised pt. if she is unable to cut  the pills to call us back as we may need to call in a lower dose.  Mervin Kung CMA  Mar 10, 2010 9:33 AM     New/Updated Medications: HYDROCHLOROTHIAZIDE 12.5 MG TABS (HYDROCHLOROTHIAZIDE) Take one half  tablet by mouth daily.

## 2010-11-23 NOTE — Letter (Signed)
Summary: Previsit letter  [facility]  [address] [place], [place] [address]   Phone: [phone]  Fax: [phone]       11/04/2009 MRN: [id number]  [facility] [address] [place], [place]  [address]  Dear Ms. [facility],  Welcome to the Gastroenterology Division at [facility].    You are scheduled to see a nurse for your pre-procedure visit on 11/16/2009 at 11:00am on the 3rd floor at [facility], [address]  We ask that you try to arrive at our office 15 minutes prior to your appointment time to allow for check-in.  Your nurse visit will consist of discussing your medical and surgical history, your immediate family medical history, and your medications.    Please bring a complete list of all your medications or, if you prefer, bring the medication bottles and we will list them.  We will need to be aware of both prescribed and over the counter drugs.  We will need to know exact dosage information as well.  If you are on blood thinners (Coumadin, Plavix, Aggrenox, Ticlid, etc.) please call our office today/prior to your appointment, as we need to consult with your physician about holding your medication.   Please be prepared to read and sign documents such as consent forms, a financial agreement, and acknowledgement forms.  If necessary, and with your consent, a friend or relative is welcome to sit-in on the nurse visit with you.  Please bring your insurance card so that we may make a copy of it.  If your insurance requires a referral to see a specialist, please bring your referral form from your primary care physician.  No co-pay is required for this nurse visit.     If you cannot keep your appointment, please call [phone] to cancel or reschedule prior to your appointment date.  This allows [place] the opportunity to schedule an appointment for another patient in need of care.    Thank you for choosing [facility] for your medical  needs.  We appreciate the opportunity to care for you.  Please visit [place] at our website  to learn more about our practice.                     Sincerely.                                                                                                                   The Gastroenterology Division

## 2010-11-23 NOTE — Assessment & Plan Note (Signed)
Summary: 3 month follow up/mhf   Vital Signs:  Patient profile:   65 year old female Height:      64 inches Weight:      212 pounds BMI:     36.52 O2 Sat:      99 % on Room air Temp:     98.4 degrees F oral Pulse rate:   46 / minute Pulse rhythm:   regular Resp:     16 per minute BP sitting:   110 / 70  (right arm) Cuff size:   large  Vitals Entered By: Glendell Docker CMA (Mar 08, 2010 9:24 AM)  O2 Flow:  Room air CC: Rm @- 3 Month Follow up disease management Is Patient Diabetic? No Comments no concerns   Primary Care Provider:  DThomos Lemons DO  CC:  Rm @- 3 Month Follow up disease management.  History of Present Illness:  Hypertension Follow-Up      This is a 65 year old woman who presents for Hypertension follow-up.  The patient denies lightheadedness, headaches, and fatigue.  The patient denies the following associated symptoms: chest pain.  Compliance with medications (by patient report) has been near 100%.  The patient reports that dietary compliance has been fair.  The patient reports exercising 3-4X per week.  she feels more energy.  she has been working in her garden and taking walks in the afternoon.  Preventive Screening-Counseling & Management  Alcohol-Tobacco     Smoking Status: never  Allergies: 1)  ! Codeine 2)  ! Mucinex Maximum Strength (Guaifenesin) 3)  ! * Latex 4)  ! 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) Breast Biopsy;benign  lesion (1996)  Cholecystectomy (10/2006)  Tubal ligation       Rt Renal Artery Aneurysm Repair (1976)    Rt Femoral Pseudoaneurysm & Rt groin hematoma evacuation (02/2007)     Family History: Father deceased - history of colitis Mother - hypertension, CAD, and CVA Brother - history of CVA Family History of Colon Cancer:Paternal great aunt     allergies: mother asthma: daughter, maternal uncle  heart disease: mother father  clotting disorders: mother (stroke), brother (stroke), sister (stroke)            Social History: Married with 3 grown children  Never Smoked  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   Neck:  supple and no masses.  no carotid bruits.   Lungs:  normal respiratory effort, normal breath sounds, no crackles, and no wheezes.   Heart:  normal rate, regular rhythm, and no gallop.   Extremities:  trace left pedal edema and trace right pedal edema.     Impression & Recommendations:  Problem # 1:  HYPERTENSION (ICD-401.9) Assessment Improved well controlled.  Maintain current medication regimen.  Her updated medication list for  this problem includes:    Metoprolol Tartrate 50 Mg Tabs (Metoprolol tartrate) .Marland Kitchen... Take one tablet by mouth twice a day    Diltiazem Hcl Er Beads 120 Mg Xr24h-cap (Diltiazem hcl er beads) .Marland Kitchen... Take 1 tablet by mouth two times a day    Losartan Potassium 50 Mg Tabs (Losartan potassium) ..... One by mouth two times a day    Hydrochlorothiazide 12.5 Mg Tabs (Hydrochlorothiazide) .Marland Kitchen... Take one half  tablet by mouth daily.  BP today: 110/70 Prior BP: 144/74 (12/21/2009)  Labs Reviewed: K+: 4.4 (02/02/2010) Creat: : 1.4 (02/02/2010)   Chol: 183 (01/07/2009)   HDL: 39.10 (01/07/2009)   LDL: 121 (01/07/2009)   TG: 113.0 (01/07/2009)  Future Orders: T-Basic Metabolic Panel [id number]) ... 09/06/2010 T- Hemoglobin A1C ([id number]) ... 09/06/2010  Problem #  2:  RENAL INSUFFICIENCY (ICD-588.9) mild increase in Cr.  pt cautioned against use of NSAIDs.  Orders: T-Basic Metabolic Panel [phone])  Complete Medication List: 1)  Lexapro 10 Mg Tabs (Escitalopram oxalate) .... One by mouth once daily 2)  Nexium 40 Mg Cpdr (Esomeprazole magnesium) .... Take 1 tablet by mouth two times a day 3)  Phillips Colon Health Caps (Probiotic product) .... Take 1 capsule by mouth once a day 4)  Metoprolol Tartrate 50 Mg Tabs (Metoprolol tartrate) .... Take one tablet by mouth twice a day 5)  Glucosamine Chondroitin Complx Caps (Glucosamine-chondroit-biofl-mn) .... Take 1 tablet by mouth once a day 6)  Multivitamins Tabs (Multiple vitamin) .Marland Kitchen.. 1 tab by mouth once daily 7)  Diltiazem Hcl Er Beads 120 Mg Xr24h-cap (Diltiazem hcl er beads) .... Take 1 tablet by mouth two times a day 8)  Imodium A-d 2 Mg Tabs (Loperamide hcl) .... As needed 9)  Aspirin Ec Low Dose 81 Mg Tbec (Aspirin) .... Take 1 tablet by mouth once a day 10)  Losartan Potassium 50 Mg Tabs (Losartan potassium) .... One by mouth two times a day 11)  Eq Chlortabs 4 Mg Tabs (Chlorpheniramine maleate) .Marland Kitchen.. 1 by mouth daily as needed 12)  Hydrochlorothiazide 12.5 Mg Tabs (Hydrochlorothiazide) .... Take one half  tablet by mouth daily.  Patient Instructions: 1)  Please schedule a follow-up appointment in 6 months. 2)  Avoid over the counter NSAIDs 3)  Avoid dehydration 4)  BMP prior to visit, ICD-9: 401.9  5)  HbgA1C prior to visit, ICD-9: 790.29 6)  Please return for lab work one (1) week before your next appointment.   Current Allergies (reviewed today): ! CODEINE ! MUCINEX MAXIMUM STRENGTH (GUAIFENESIN) ! * LATEX ! * SULFASALZINE ASACOL (MESALAMINE)

## 2010-11-23 NOTE — Miscellaneous (Signed)
Summary: [facility] PREVISIT/PREP  Clinical Lists Changes  Medications: Added new medication of MOVIPREP 100 GM  SOLR (PEG-KCL-NACL-NASULF-NA ASC-C) As per prep instructions. - Signed Rx of MOVIPREP 100 GM  SOLR (PEG-KCL-NACL-NASULF-NA ASC-C) As per prep instructions.;  #1 x 0;  Signed;  Entered by: Wyona Almas RN;  Authorized by: Iva Boop MD, [facility];  Method used: Electronically to [facility] [address], [place], [place], [place]  [address], Ph: [phone], Fax: [phone] Observations: Added new observation of ALLERGY REV: Done (11/16/2009 10:29)    Prescriptions: MOVIPREP 100 GM  SOLR (PEG-KCL-NACL-NASULF-NA ASC-C) As per prep instructions.  #1 x 0   Entered by:   Wyona Almas RN   Authorized by:   Iva Boop MD, [facility]   Signed by:   Wyona Almas RN on 11/16/2009   Method used:   Electronically to        [facility] (retail)       [address]       [place], [place]  [address]       Ph: [phone]       Fax: [phone]   RxID:   [id number]

## 2010-11-23 NOTE — Assessment & Plan Note (Signed)
Summary: sinus headaches drainage/dt   Vital Signs:  Patient profile:   65 year old female Height:      64 inches Weight:      219 pounds BMI:     37.73 O2 Sat:      95 % on Room air Temp:     98.1 degrees F oral Pulse rate:   55 / minute Pulse rhythm:   regular Resp:     16 per minute BP sitting:   122 / 80  (left arm) Cuff size:   large  Vitals Entered By: Glendell Docker CMA (July 06, 2010 3:17 PM)  O2 Flow:  Room air CC: left ear sore Is Patient Diabetic? No Pain Assessment Patient in pain? no        Primary Care Provider:  [name]  CC:  left ear sore.  History of Present Illness: c/o left ear soreness for the past month, she states she was seen by ENT in August and was evaluated, then evaluated by audiology and was informed that her ear was okay.  Patient states she has a lot of issues with left side of head, from severe scalp  irritation, drainage into her throat  left ear ing hole - weeping fluid  Preventive Screening-Counseling & Management  Alcohol-Tobacco     Smoking Status: never  Allergies: 1)  ! Codeine 2)  ! Mucinex Maximum Strength (Guaifenesin) 3)  ! * Latex 4)  ! 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) Breast Biopsy;benign lesion (1996)  Cholecystectomy (10/2006)  Tubal ligation        Rt Renal Artery Aneurysm Repair (1976)    Rt Femoral Pseudoaneurysm & Rt groin hematoma evacuation (02/2007)     Family  History: Father deceased - history of colitis Mother - hypertension, CAD, and CVA Brother - history of CVA Family History of Colon Cancer:Paternal great aunt     allergies: mother asthma: daughter, maternal uncle  heart disease: mother father  clotting disorders: mother (stroke), brother (stroke), sister (stroke)             Social History: Married with 3 grown children  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   Ears:  R ear normal.  L TM retracted Lungs:  normal respiratory effort, normal breath sounds, no crackles, and no wheezes.   Heart:  normal rate, regular rhythm, and no gallop.     Impression & Recommendations:  Problem # 1:  EAR PAIN, LEFT (ICD-388.70) left ear pain likely secondary to serous otitis media.  tx with ceftin and flonase. Patient advised to call office if symptoms persist or worsen.  Her updated medication list for this problem includes:    Cefuroxime Axetil 500 Mg Tabs (Cefuroxime axetil) ..... One by mouth two times a  day  Complete Medication List: 1)  Lexapro 10 Mg Tabs (Escitalopram oxalate) .... One by mouth once daily 2)  Nexium 40 Mg Cpdr (Esomeprazole magnesium) .... Take 1 tablet by mouth two times a day 3)  Phillips Colon Health Caps (Probiotic product) .... Take 1 capsule by mouth once a day 4)  Metoprolol Tartrate 25 Mg Tabs (Metoprolol tartrate) .... 1/2 tablet by mouth two times a day 5)  Glucosamine Chondroitin Complx Caps (Glucosamine-chondroit-biofl-mn) .... Take 1 tablet by mouth once a day 6)  Multivitamins Tabs (Multiple vitamin) .Marland Kitchen.. 1 tab by mouth once daily 7)  Diltiazem Hcl Er Beads 120 Mg Xr24h-cap (Diltiazem hcl er beads) .... Take 1 tablet by mouth two times a day 8)  Imodium A-d 2 Mg Tabs (Loperamide hcl) .... As needed 9)  Aspirin Ec Low Dose 81 Mg Tbec (Aspirin) .... Take 1 tablet by mouth once a day 10)  Losartan Potassium 50 Mg  Tabs (Losartan potassium) .... One by mouth two times a day 11)  Eq Chlortabs 4 Mg Tabs (Chlorpheniramine maleate) .Marland Kitchen.. 1 by mouth daily as needed 12)  Hydrochlorothiazide 12.5 Mg Tabs (Hydrochlorothiazide) .... Take one half  tablet by mouth daily. 13)  Fluticasone Propionate 50 Mcg/act Susp (Fluticasone propionate) .... 2 sprays each nostril once daily 14)  Cefuroxime Axetil 500 Mg Tabs (Cefuroxime axetil) .... One by mouth two times a day  Patient Instructions: 1)  Please schedule a follow-up appointment in 3 months.  Prescriptions: CEFUROXIME AXETIL 500 MG TABS (CEFUROXIME AXETIL) one by mouth two times a day  #20 x 0   Entered and Authorized by:   D. Thomos Lemons DO   Signed by:   D. Thomos Lemons DO on 07/06/2010   Method used:   Print then Give to Patient   RxID:   [id number] FLUTICASONE PROPIONATE 50 MCG/ACT SUSP (FLUTICASONE PROPIONATE) 2 sprays each nostril once daily  #1 x 2   Entered and Authorized by:   D. Thomos Lemons DO   Signed by:   D. Thomos Lemons DO on 07/06/2010   Method used:   Electronically to        [facility] (retail)       [address]       [place], [place]  [address]       Ph: [phone]       Fax: [phone]   RxID:   [id number]   Current Allergies (reviewed today): ! CODEINE ! MUCINEX MAXIMUM STRENGTH (GUAIFENESIN) ! * LATEX ! * SULFASALZINE ASACOL (MESALAMINE)

## 2010-11-23 NOTE — Progress Notes (Signed)
Summary: refills   Phone Note Refill Request Message from:  Patient on August 27, 2010 3:32 PM  Refills Requested: Medication #1:  DILTIAZEM HCL ER BEADS 120 MG XR24H-CAP Take 1 tablet by mouth two times a day  Medication #2:  HYDROCHLOROTHIAZIDE 12.5 MG TABS Take one half  tablet by mouth daily.  Medication #3:  METOPROLOL TARTRATE 25 MG TABS 1/2 tablet by mouth two times a day [facility] [phone] pt want 90 day supply  Initial call taken by: Judie Grieve,  August 27, 2010 3:38 PM    Prescriptions: HYDROCHLOROTHIAZIDE 12.5 MG TABS (HYDROCHLOROTHIAZIDE) Take one half  tablet by mouth daily.  #90 x 3   Entered by:   Kem Parkinson   Authorized by:   Ferman Hamming, MD, [facility]   Signed by:   Kem Parkinson on 08/27/2010   Method used:   Electronically to        [facility] (retail)       [address]       [place], [place]  [address]       Ph: [phone]       Fax: [phone]   RxID:   [id number] DILTIAZEM HCL ER BEADS 120 MG XR24H-CAP (DILTIAZEM HCL ER BEADS) Take 1 tablet by mouth two times a day  #180 x 3   Entered by:   Kem Parkinson   Authorized by:   Ferman Hamming, MD, [facility]   Signed by:   Kem Parkinson on 08/27/2010   Method used:   Electronically to        [facility].* (retail)       [address]       [place], [place]  [address]       Ph: [phone]       Fax: [phone]   RxID:   [id number] METOPROLOL TARTRATE 25 MG TABS (METOPROLOL TARTRATE) 1/2 tablet by mouth two times a day  #90 x 3   Entered by:   Kem Parkinson   Authorized by:   Ferman Hamming, MD, [facility]   Signed by:   Kem Parkinson on 08/27/2010   Method used:   Electronically to        [facility].* (retail)       [address]       [place], [place]  [address]       Ph: [phone]       Fax: [phone]   RxID:    [id number]

## 2010-11-23 NOTE — Progress Notes (Signed)
Summary: refills--lexapro, nexium  Phone Note Refill Request Message from:  Fax from [facility] on May 14, 2010 4:46 PM  Refills Requested: Medication #1:  LEXAPRO 10 MG  TABS one by mouth once daily   Dosage confirmed as above?Dosage Confirmed   Supply Requested: 3 months  Medication #2:  NEXIUM 40 MG  CPDR Take 1 tablet by mouth two times a day   Dosage confirmed as above?Dosage Confirmed   Supply Requested: 3 months Next Appointment Scheduled: 09/13/10--Dr Artist Pais Initial call taken by: Mervin Kung CMA Duncan Dull),  May 14, 2010 4:47 PM    Prescriptions: NEXIUM 40 MG  CPDR (ESOMEPRAZOLE MAGNESIUM) Take 1 tablet by mouth two times a day  #180 x 1   Entered by:   Mervin Kung CMA (AAMA)   Authorized by:   D. Thomos Lemons DO   Signed by:   Mervin Kung CMA (AAMA) on 05/14/2010   Method used:   Electronically to        [facility]* (retail)             ,          Ph: [phone]       Fax: [phone]   RxID:   [id number] LEXAPRO 10 MG  TABS (ESCITALOPRAM OXALATE) one by mouth once daily  #90 x 1   Entered by:   Mervin Kung CMA (AAMA)   Authorized by:   D. Thomos Lemons DO   Signed by:   Mervin Kung CMA (AAMA) on 05/14/2010   Method used:   Electronically to        [facility]* (retail)             ,          Ph: [phone]       Fax: [phone]   RxID:   [id number]

## 2010-11-23 NOTE — Miscellaneous (Signed)
Summary: mammogram update  Clinical Lists Changes  Observations: Added new observation of MAMMOGRAM: normal (04/14/2010 14:01)      Preventive Care Screening  Mammogram:    Date:  04/14/2010    Results:  normal

## 2010-11-23 NOTE — Procedures (Signed)
Summary: Colonoscopy  Patient: [name] Note: All result statuses are Final unless otherwise noted.  Tests: (1) Colonoscopy (COL)   COL Colonoscopy           DONE     [facility]     [address]     [place], [place]  [address]           COLONOSCOPY PROCEDURE REPORT           PATIENT:  [name], [name]  MR#:  [id number]     BIRTHDATE:  23-Feb-1946, 63 yrs. old  GENDER:  female           ENDOSCOPIST:  Iva Boop, MD, [facility]           PROCEDURE DATE:  11/30/2009     PROCEDURE:  Colonoscopy with biopsy     ASA CLASS:  Class III     INDICATIONS:  surveillance, Crohn's disease - colitis. No symptoms     now on Lialda 2.4 g daily.Last colonoscopy 9/08 with patchy     inflammatory disease and 2 diminutive  tubular adenomas,     diverticulosis and hemorrhoids.           MEDICATIONS:   Fentanyl 75 mcg IV, Versed 7 mg IV           DESCRIPTION OF PROCEDURE:   After the risks benefits and     alternatives of the procedure were thoroughly explained, informed     consent was obtained.  Digital rectal exam was performed and     revealed no abnormalities.   The LB [id number] endoscope     was introduced through the anus and advanced to the terminal ileum     which was intubated for a short distance, without limitations.     The quality of the prep was excellent, using MoviPrep.  The     instrument was then slowly withdrawn as the colon was fully     examined. Insertion: 2:00 minutes Withdrawal: 17:12 minutes     <<PROCEDUREIMAGES>>           FINDINGS:  Nodular mucosa was found terminal ileum Multiple     biopsies were obtained and sent to pathology.  Severe     diverticulosis was found in the left colon. Mild in descending but     severe in sigmoid.  This was otherwise a normal examination of the     colon. The colonic mucosa appeared normal throughout and there     were no inflammatory changes seen. Surveillance biopsies taken.     Multiple biopsies were obtained  and sent to pathology.     Retroflexed views in the rectum revealed no abnormalities.    The     scope was then withdrawn from the patient and the procedure     completed.           COMPLICATIONS:  None           ENDOSCOPIC IMPRESSION:     1) Nodular mucosa at the terminal ileum (biopsied)     2) Severe diverticulosis in the left colon     3) Otherwise normal examination, no endoscopic mucosal changes     to suggest active inflammatory bowel disase (Crohn's).     Surveillance biopsies taken.     RECOMMENDATIONS:     1) Await biopsy results           REPEAT EXAM:  In for Colonoscopy, pending biopsy results.  Iva Boop, MD, [facility]           CC:  The Patient           n.     eSIGNED:   Iva Boop at 11/30/2009 01:02 PM           Haynes Dage, [id number]  Note: An exclamation mark (!) indicates a result that was not dispersed into the flowsheet. Document Creation Date: 11/30/2009 1:03 PM _______________________________________________________________________  (1) Order result status: Final Collection or observation date-time: 11/30/2009 12:50 Requested date-time:  Receipt date-time:  Reported date-time:  Referring Physician:   Ordering Physician: Stan Head [phone]) Specimen Source:  Source: Launa Grill Order Number: [id number] Lab site:   Appended Document: Colonoscopy     Procedures Next Due Date:    Colonoscopy: 11/2010

## 2010-11-23 NOTE — Letter (Signed)
Summary: [facility] Instructions  [facility]  [address] [place], [place] [address]   Phone: [phone]  Fax: [phone]       [name]    12-Apr-1946    MRN: [id number]        Procedure Day /Date: 11/30/09  Monday     Arrival Time:  10:30am     Procedure Time: 11:30am     Location of Procedure:                    _x _   [facility] (4th Floor)                        PREPARATION FOR COLONOSCOPY WITH MOVIPREP   Starting 5 days prior to your procedure _ 2/2/11_ do not eat nuts, seeds, popcorn, corn, beans, peas,  salads, or any raw vegetables.  Do not take any fiber supplements (e.g. Metamucil, Citrucel, and Benefiber).  THE DAY BEFORE YOUR PROCEDURE         DATE:  11/29/09  DAY:  Sunday  1.  Drink clear liquids the entire day-NO SOLID FOOD  2.  Do not drink anything colored red or purple.  Avoid juices with pulp.  No orange juice.  3.  Drink at least 64 oz. (8 glasses) of fluid/clear liquids during the day to prevent dehydration and help the prep work efficiently.  CLEAR LIQUIDS INCLUDE: Water Jello Ice Popsicles Tea (sugar ok, no milk/cream) Powdered fruit flavored drinks Coffee (sugar ok, no milk/cream) Gatorade Juice: apple, white grape, white cranberry  Lemonade Clear bullion, consomm, broth Carbonated beverages (any kind) Strained chicken noodle soup Hard Candy                             4.  In the morning, mix first dose of MoviPrep solution:    Empty 1 Pouch A and 1 Pouch B into the disposable container    Add lukewarm drinking water to the top line of the container. Mix to dissolve    Refrigerate (mixed solution should be used within 24 hrs)  5.  Begin drinking the prep at 5:00 p.m. The MoviPrep container is divided by 4 marks.   Every 15 minutes drink the solution down to the next mark (approximately 8 oz) until the full liter is complete.   6.  Follow completed prep with 16 oz of clear liquid of your choice  (Nothing red or purple).  Continue to drink clear liquids until bedtime.  7.  Before going to bed, mix second dose of MoviPrep solution:    Empty 1 Pouch A and 1 Pouch B into the disposable container    Add lukewarm drinking water to the top line of the container. Mix to dissolve    Refrigerate  THE DAY OF YOUR PROCEDURE      DATE:  11/30/09  DAY:   Monday  Beginning at 6:30 a.m. (5 hours before procedure):         1. Every 15 minutes, drink the solution down to the next mark (approx 8 oz) until the full liter is complete.  2. Follow completed prep with 16 oz. of clear liquid of your choice.    3. You may drink clear liquids until 9:30am   (2 HOURS BEFORE PROCEDURE).   MEDICATION INSTRUCTIONS  Unless otherwise instructed, you should take regular prescription medications with a small sip of water  as early as possible the morning of your procedure.         OTHER INSTRUCTIONS  You will need a responsible adult at least 66 years of age to accompany you and drive you home.   This person must remain in the waiting room during your procedure.  Wear loose fitting clothing that is easily removed.  Leave jewelry and other valuables at home.  However, you may wish to bring a book to read or  an iPod/MP3 player to listen to music as you wait for your procedure to start.  Remove all body piercing jewelry and leave at home.  Total time from sign-in until discharge is approximately 2-3 hours.  You should go home directly after your procedure and rest.  You can resume normal activities the  day after your procedure.  The day of your procedure you should not:   Drive   Make legal decisions   Operate machinery   Drink alcohol   Return to work  You will receive specific instructions about eating, activities and medications before you leave.    The above instructions have been reviewed and explained to me by   Wyona Almas RN  November 16, 2009 11:32 AM     I fully  understand and can verbalize these instructions _____________________________ Date _________

## 2010-11-23 NOTE — Progress Notes (Signed)
Summary: Lexapro Refill  Phone Note Call from Patient   Caller: Patient Details for Reason: needs rx Summary of Call: Pt get her medication from [facility]  , but she is out of her Lexapro  and want 30 days supply called  to [facility]  in [place] their phone # is    [phone]  Initial call taken by: Darral Dash,  January 04, 2010 3:20 PM  Follow-up for Phone Call        ok to call in lexapro Follow-up by: D. Thomos Lemons DO,  January 04, 2010 5:07 PM  Additional Follow-up for Phone Call Additional follow up Details #1::        Rx sent to pharmacy  Additional Follow-up by: Glendell Docker CMA,  January 04, 2010 5:21 PM    Prescriptions: LEXAPRO 10 MG  TABS (ESCITALOPRAM OXALATE) one by mouth once daily  #30 x 0   Entered by:   Glendell Docker CMA   Authorized by:   D. Thomos Lemons DO   Signed by:   Glendell Docker CMA on 01/04/2010   Method used:   Electronically to        [facility] (retail)       [address]       [place], [place]  [address]       Ph: [phone]       Fax: [phone]   RxID:   [id number]

## 2010-11-23 NOTE — Progress Notes (Signed)
Summary: Heart Problems  Phone Note Call from Patient Call back at Home Phone [phone]   Caller: Patient Summary of Call: patient called stating her heart was doing the same thing this morning that  it did last year , and she is concerned. She states she called Dr Jens Som office, but did not hear anything back form them. She was informed there were 2 attemptes to contact her without success. She states she will call there office back and have them address her concerns Initial call taken by: Glendell Docker CMA,  December 09, 2009 11:42 AM

## 2010-11-23 NOTE — Assessment & Plan Note (Signed)
Summary: back in atrial fib/jr  Medications Added MULTIVITAMINS   TABS (MULTIPLE VITAMIN) 1 tab by mouth once daily HYDROCHLOROTHIAZIDE 12.5 MG TABS (HYDROCHLOROTHIAZIDE) Take one tablet by mouth daily.        Referring Provider:  Self Referral Primary Provider:  [name]  CC:  pt complains of rythm changes.  History of Present Illness: Pleasant female with past medical history of coronary artery disease and atrial fibrillation returns for followup. Last cardiac catheterization on Mar 06, 2007 showed an ejection fraction of 55%. There is nonobstructive plaque in the LAD, and circumflex and the right coronary artery was normal. 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 Echocardiogram in March of 2010 showed normal LV function, moderate biatrial enlargement and moderate tricuspid regurgitation. She has had a previous TEE guided cardioversion on Feb 27, 2009. Also note the patient is extremely sensitive to Coumadin.  Previous TSH is normal. Given that her atria have been enlarged, it has been felt that she will be high risk for recurrent atrial fibrillation. I last saw her in November of 2010. Since then she had an episode yesterday of her heart skipping. This began at approximately 7 AM and lasted till 10 AM. It was similar to her previous episodes of atrial fibrillation. This is the first episode since May of 2010. She otherwise has not had dyspnea on exertion, orthopnea, PND, pedal edema or syncope. She does not have exertional chest pain.  Current Medications (verified): 1)  Lexapro 10 Mg  Tabs (Escitalopram Oxalate) .... One By Mouth Once Daily 2)  Nexium 40 Mg  Cpdr (Esomeprazole Magnesium) .... Take 1 Tablet By Mouth Two Times A Day 3)  Align   Caps (Misc Intestinal Flora Regulat) .... One By Mouth Once Daily 4)  Metoprolol Tartrate 50 Mg Tabs (Metoprolol Tartrate) .... Take One Tablet By Mouth Twice A Day 5)  Glucosamine  Chondroitin Complx   Caps (Glucosamine-Chondroit-Biofl-Mn) .... Take 1 Tablet By Mouth Once A Day 6)  Multivitamins   Tabs (Multiple Vitamin) .Marland Kitchen.. 1 Tab By Mouth Once Daily 7)  Lialda 1.2 Gm  Tbec (Mesalamine) .... Take 2 Tablet Every Morning 8)  Diltiazem Hcl Er Beads 120 Mg Xr24h-Cap (Diltiazem Hcl Er Beads) .... Take 1 Tablet By Mouth Two Times A Day 9)  Imodium A-D 2 Mg Tabs (Loperamide Hcl) .... As Needed 10)  Aspirin Ec Low Dose 81 Mg Tbec (Aspirin) .... Take 1 Tablet By Mouth Once A Day 11)  Losartan Potassium 50 Mg Tabs (Losartan Potassium) .... One By Mouth Two Times A Day 12)  Lamisil At 1 % Crea (Terbinafine Hcl) .... Two Times A Day X 2 Weeks 13)  Eq Chlortabs 4 Mg Tabs (Chlorpheniramine Maleate) .Marland Kitchen.. 1 By Mouth Daily As Needed 14)  Moviprep 100 Gm  Solr (Peg-Kcl-Nacl-Nasulf-Na Asc-C) .... As Per Prep Instructions.  Allergies: 1)  ! Codeine 2)  ! Mucinex Maximum Strength (Guaifenesin) 3)  ! * Latex 4)  ! * Sulfasalzine 5)  Asacol (Mesalamine)  Past History:  Past Medical History: Reviewed history from 10/05/2009 and no changes required. 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  Balloon angioplasty was attempted; however, the        team was unable to cross with the wire.)  Depression GERD    Hyperlipidemia   Hypertension Crohn's Colitis  Right femoral pseudoaneurysm  OSA  Paroxysmal Afib Pancreatitis 2007 (biliary) Hx of chronic sinusitis   Past Surgical History: Reviewed history from 10/05/2009 and no changes required. 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 Remaining systems are negative.   Vital Signs:  Patient profile:   65 year old female Height:      64 inches Weight:      210 pounds BMI:     36.18 Pulse rate:   52 / minute Pulse rhythm:   regular Resp:     14 per minute BP sitting:   151 / 76  (left arm)  Vitals Entered By: Kem Parkinson (December 10, 2009 9:01 AM)  Physical Exam  General:  Well-developed well-nourished in no acute distress.  Skin is warm and dry.  HEENT is normal.  Neck is supple. No thyromegaly.  Chest is clear to auscultation with normal expansion.  Cardiovascular exam is regular rate and rhythm.  Abdominal exam nontender or distended. No masses palpated. Extremities show no edema. neuro grossly intact    EKG  Procedure date:  12/10/2009  Findings:      Sinus bradycardia at a rate of 52. Axis normal.  nonspecific ST changes.  Impression & Recommendations:  Problem # 1:  Hx of ATRIAL FIBRILLATION (ICD-427.31) The patient is in sinus rhythm today. She had an episode of atrial fibrillation yesterday morning but this is the first episode in quite some time and was short lived. We will continue with beta blockade and calcium blockers for rate control as well as aspirin. She is not on Coumadin given her history of lower GI bleeding. If she has more frequent episodes in the future then we will consider an antiarrhythmic. Her updated medication list for this problem includes:    Metoprolol Tartrate 50 Mg Tabs (Metoprolol tartrate) .Marland Kitchen... Take one tablet by mouth twice a day    Aspirin Ec Low Dose 81 Mg Tbec (Aspirin) .Marland Kitchen... Take 1 tablet by mouth once a day  Problem # 2:  CAROTID BRUIT (ICD-785.9) Check carotid Dopplers. Orders: Carotid Duplex (Carotid  Duplex)  Problem # 3:  CAD, UNSPECIFIED SITE (ICD-414.00) CAD continue aspirin and beta blocker. Intolerant to statins. Her updated medication list for this problem includes:    Metoprolol Tartrate 50 Mg Tabs (Metoprolol tartrate) .Marland Kitchen... Take one tablet by mouth twice a day    Diltiazem Hcl Er Beads 120 Mg Xr24h-cap (Diltiazem hcl er beads) .Marland Kitchen... Take 1 tablet by mouth two times a day    Aspirin Ec Low Dose 81 Mg Tbec (Aspirin) .Marland Kitchen... Take 1 tablet by mouth once a day  Problem # 4:  CROHN'S DISEASE-LARGE INTESTINE (VS. ULCERATIVE COLITIS) (ICD-555.1)  Problem # 5:  HYPERLIPIDEMIA (ICD-272.4) Intolerant to statins. Continue diet.  Problem # 6:  HYPERTENSION (ICD-401.9) Blood pressure elevated. Add HCTZ 12.5 mg p.o. daily. Check bmet in one week. Her updated medication list for this problem includes:    Metoprolol Tartrate 50 Mg Tabs (  Metoprolol tartrate) .Marland Kitchen... Take one tablet by mouth twice a day    Diltiazem Hcl Er Beads 120 Mg Xr24h-cap (Diltiazem hcl er beads) .Marland Kitchen... Take 1 tablet by mouth two times a day    Aspirin Ec Low Dose 81 Mg Tbec (Aspirin) .Marland Kitchen... Take 1 tablet by mouth once a day    Losartan Potassium 50 Mg Tabs (Losartan potassium) ..... One by mouth two times a day    Hydrochlorothiazide 12.5 Mg Tabs (Hydrochlorothiazide) .Marland Kitchen... Take one tablet by mouth daily.  Problem # 7:  GERD (ICD-530.81)  Her updated medication list for this problem includes:    Nexium 40 Mg Cpdr (Esomeprazole magnesium) .Marland Kitchen... Take 1 tablet by mouth two times a day  Problem # 8:  ASTHMA (ICD-493.90)  Patient Instructions: 1)  Your physician recommends that you schedule a follow-up appointment in: 6 MONTHS 2)  Your physician has recommended you make the following change in your medication: START HYDROCHLORITHIAZIDE 12.5MG  ONE TABLET ONCE DAILY 3)  Your physician has requested that you have a carotid duplex. This test is an ultrasound of the carotid arteries in your neck. It looks at blood flow through  these arteries that supply the brain with blood. Allow one hour for this exam. There are no restrictions or special instructions. 4)  Your physician recommends that you return for lab work in:ONE WEEK Prescriptions: HYDROCHLOROTHIAZIDE 12.5 MG TABS (HYDROCHLOROTHIAZIDE) Take one tablet by mouth daily.  #30 x 12   Entered by:   [name], RN   Authorized by:   Ferman Hamming, MD, [facility]   Signed by:   [name], RN on 12/10/2009   Method used:   Electronically to        [facility].* (retail)       [address]       [place], [place]  [address]       Ph: [phone]       Fax: [phone]   RxID:   [id number]

## 2010-11-23 NOTE — Letter (Signed)
Summary: Lab Test Reminder  [facility], Main Office  [address]   [place], [place] [address]   Phone: [phone]  Fax: [phone]     January 28, 2010 MRN: [id number]   [facility] [address] [place], [place]  [address]   Dear Ms. [facility],  During your last appointment, Dr.  Jens Som  requested you have some follow-up blood work.  Our records indicate you have not had this done.  Please have this blood work done as soon as possible.  It is important that patients and their doctor work together in the management/treatment of their health care.   If you have already had your blood work drawn, please disregard this letter.  Thanks,  [facility]

## 2010-11-23 NOTE — Assessment & Plan Note (Signed)
Summary: rov/f/u atrial fib/dm  Medications Added METOPROLOL TARTRATE 50 MG TABS (METOPROLOL TARTRATE) 1/2 tab by mouth two times a day        Referring Provider:  Self Referral Primary Provider:  [name]   History of Present Illness: Pleasant female with past medical history of coronary artery disease and atrial fibrillation returns for followup. Last cardiac catheterization on Mar 06, 2007 showed an ejection fraction of 55%. There is nonobstructive plaque in the LAD, and circumflex and the right coronary artery was normal. 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 Echocardiogram in March of 2010 showed normal LV function, moderate biatrial enlargement and moderate tricuspid regurgitation. She has had a previous TEE guided cardioversion on Feb 27, 2009. Also note the patient is extremely sensitive to Coumadin.  Previous TSH is normal. Given that her atria have been enlarged, it has been felt that she will be high risk for recurrent atrial fibrillation. Carotid Dopplers in February of 2011 showed 0-39% stenosis bilaterally. I last saw her in August of 2011. Since then earlier this week she had a bout of atrial fibrillation. She feels her heart skipping and fatigue but there is no dyspnea or chest pain. She converted after 24 hours. There is been no dizziness. She otherwise has not had dyspnea, chest pain.  Current Medications (verified): 1)  Lexapro 10 Mg  Tabs (Escitalopram Oxalate) .... One By Mouth Once Daily 2)  Nexium 40 Mg  Cpdr (Esomeprazole Magnesium) .... Take 1 Tablet By Mouth Two Times A Day 3)  Phillips Colon Health  Caps (Probiotic Product) .... Take 1 Capsule By Mouth Once A Day 4)  Metoprolol Tartrate 50 Mg Tabs (Metoprolol Tartrate) .... 1/2 Tab By Mouth Two Times A Day 5)  Glucosamine Chondroitin Complx   Caps (Glucosamine-Chondroit-Biofl-Mn) .... Take 1 Tablet By Mouth Once A Day 6)  Multivitamins   Tabs (Multiple  Vitamin) .Marland Kitchen.. 1 Tab By Mouth Once Daily 7)  Diltiazem Hcl Er Beads 120 Mg Xr24h-Cap (Diltiazem Hcl Er Beads) .... Take 1 Tablet By Mouth Two Times A Day 8)  Imodium A-D 2 Mg Tabs (Loperamide Hcl) .... As Needed 9)  Aspirin Ec Low Dose 81 Mg Tbec (Aspirin) .... Take 1 Tablet By Mouth Once A Day 10)  Cozaar 25 Mg Tabs (Losartan Potassium) .... Take 1 Tablet By Mouth Two Times A Day 11)  Eq Chlortabs 4 Mg Tabs (Chlorpheniramine Maleate) .Marland Kitchen.. 1 By Mouth Daily As Needed 12)  Hydrochlorothiazide 12.5 Mg Tabs (Hydrochlorothiazide) .... Take One Half  Tablet By Mouth Daily. 13)  Fluticasone Propionate 50 Mcg/act Susp (Fluticasone Propionate) .... 2 Sprays Each Nostril Once Daily  Allergies: 1)  ! Codeine 2)  ! Mucinex Maximum Strength (Guaifenesin) 3)  ! * Latex 4)  ! * Sulfasalzine 5)  Asacol (Mesalamine)  Past History:  Past Medical History: Reviewed history from 07/06/2010 and no changes required. 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  Balloon angioplasty was attempted; however, the        team was unable to cross with the wire.)  Depression GERD      Hyperlipidemia    Hypertension Crohn's Colitis  Right femoral pseudoaneurysm   OSA  Paroxysmal Afib Pancreatitis 2007 (biliary)  Hx of chronic sinusitis  Solitary left kidney  Past Surgical History: Reviewed history from 07/06/2010 and no changes required. 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 Remaining systems are negative.   Vital Signs:  Patient profile:   65 year old female Height:      64 inches Weight:      222 pounds BMI:     38.24 Pulse rate:   55 / minute Resp:     16 per minute BP sitting:   130 / 80  (left arm)  Vitals Entered By: Kem Parkinson (September 09, 2010 8:52 AM)  Physical Exam  General:  Well-developed well-nourished in no acute distress.  Skin is warm and dry.  HEENT is normal.  Neck is supple. No thyromegaly.  Chest is clear to auscultation with normal expansion.  Cardiovascular exam is regular rate and rhythm.  Abdominal exam nontender or distended. No masses palpated. Extremities show no edema. neuro grossly intact    EKG  Procedure date:  09/09/2010  Findings:      Sinus bradycardia with no ST changes.  Impression & Recommendations:  Problem # 1:  Hx of ATRIAL FIBRILLATION (ICD-427.31) Patient had another episode of atrial fibrillation. She is now in sinus rhythm. We had a discussion today about initiating antiarrhythmic therapy to decrease the frequency of events. She would most likely need either amiodarone or tikosyn. She does not wish to pursue that at this point. She does have embolic risk factors of female sex, age 17, hypertension and coronary disease. I recommended Coumadin today. She does have a history of GI bleed but I think she may tolerate this. She will consider this and let us know if she is agreeable. She understands the risk of CVA. Her updated medication list for this problem includes:    Metoprolol Tartrate 50 Mg Tabs (Metoprolol tartrate) .Marland Kitchen... 1/2 tab by mouth two times a day    Aspirin Ec Low Dose 81 Mg Tbec (Aspirin) .Marland Kitchen... Take 1 tablet by mouth once a day  Problem # 2:  CAD, UNSPECIFIED SITE (ICD-414.00) Continue aspirin and beta blocker. Intolerant to statins. Her updated medication list  for this problem includes:    Metoprolol Tartrate 50 Mg Tabs (Metoprolol tartrate) .Marland Kitchen... 1/2 tab by mouth two times a day    Diltiazem Hcl Er Beads 120 Mg Xr24h-cap (Diltiazem hcl er beads) .Marland Kitchen... Take 1 tablet by mouth two times a day    Aspirin Ec Low Dose 81 Mg Tbec (Aspirin) .Marland Kitchen... Take 1 tablet by mouth once a day  Problem # 3:  SLEEP APNEA, OBSTRUCTIVE (ICD-327.23)  Problem # 4:  HYPERTENSION (ICD-401.9) Blood pressure controlled on present medications. Will continue. Her updated medication list for this problem includes:    Metoprolol Tartrate 50 Mg Tabs (Metoprolol tartrate) .Marland Kitchen... 1/2 tab by mouth two times a day    Diltiazem Hcl Er Beads 120 Mg Xr24h-cap (Diltiazem hcl er beads) .Marland Kitchen... Take 1 tablet by mouth two times a day    Aspirin Ec Low Dose  81 Mg Tbec (Aspirin) .Marland Kitchen... Take 1 tablet by mouth once a day    Cozaar 25 Mg Tabs (Losartan potassium) .Marland Kitchen... Take 1 tablet by mouth two times a day    Hydrochlorothiazide 12.5 Mg Tabs (Hydrochlorothiazide) .Marland Kitchen... Take one half  tablet by mouth daily.  Problem # 5:  HYPERLIPIDEMIA (ICD-272.4) Continue diet. Intolerant to statins.  Problem # 6:  CROHN'S DISEASE-LARGE INTESTINE (VS. ULCERATIVE COLITIS) (ICD-555.1)  Patient Instructions: 1)  Your physician recommends that you schedule a follow-up appointment in: 3 MONTHS

## 2010-11-23 NOTE — Progress Notes (Signed)
Summary: Lab order   & Medication refill  Phone Note Call from Patient   Caller: Patient  [phone]  Details for Reason: Order for Labs   Need meds  Summary of Call: Pt called  needs all medication sent to [facility] she has changed insurance and no long uses   [facility]    she also said  she is schedule for lab on  November  14th   Initial call taken by: Darral Dash,  August 27, 2010 10:50 AM  Follow-up for Phone Call        call returned to patient [phone], she was informed the names of the medications that she needed refilled have to be provided. She state she need refills on all of her medications. Patient was informed medications that have been prescribed by Dr Artist Pais are the medication that would be able to be filled. Patient provded Cozaar, Lexapro and Nexium. She requested a 90 day supply to [facility] and states she is no longer able to use mail order due her medicare status and turning 64 Follow-up by: Glendell Docker CMA,  August 27, 2010 3:37 PM    New/Updated Medications: COZAAR 25 MG TABS (LOSARTAN POTASSIUM) Take 1 tablet by mouth two times a day Prescriptions: COZAAR 25 MG TABS (LOSARTAN POTASSIUM) Take 1 tablet by mouth two times a day  #180 x 1   Entered by:   Glendell Docker CMA   Authorized by:   D. Thomos Lemons DO   Signed by:   Glendell Docker CMA on 08/27/2010   Method used:   Electronically to        [facility] (retail)       [address]       [place], [place]  [address]       Ph: [phone]       Fax: [phone]   RxID:   [id number] NEXIUM 40 MG  CPDR (ESOMEPRAZOLE MAGNESIUM) Take 1 tablet by mouth two times a day  #180 x 1   Entered by:   Glendell Docker CMA   Authorized by:   D. Thomos Lemons DO   Signed by:   Glendell Docker CMA on 08/27/2010   Method used:   Electronically to        [facility].* (retail)       [address]       [place], [place]  [address]       Ph:  [phone]       Fax: [phone]   RxID:   [id number] LEXAPRO 10 MG  TABS (ESCITALOPRAM OXALATE) one by mouth once daily  #90 x 1   Entered by:   Glendell Docker CMA   Authorized by:   D. Thomos Lemons DO   Signed by:   Glendell Docker CMA on 08/27/2010   Method used:   Electronically to        [facility].* (retail)       [address]       [place], [place]  [address]       Ph: [phone]       Fax: [phone]   RxID:   [id number]

## 2010-11-23 NOTE — Assessment & Plan Note (Signed)
Summary: rov for osa   Primary Provider/Referring Provider:  [name]   History of Present Illness: The pt comes in today for f/u of her osa.  She has recently had a recurrence of her afib, and also a few more am headaches, and the question has been raised whether her sleep apnea is adequately controlled.  The pt has known severe osa, but has been compliant with cpap optimized to a pressure of 12cm.  She reports no machine or mask issues, and feels that she has been sleeping well with adequate daytime alertness.  She has had no major weight shifts.  Medications Prior to Update: 1)  Lexapro 10 Mg  Tabs (Escitalopram Oxalate) .... One By Mouth Once Daily 2)  Nexium 40 Mg  Cpdr (Esomeprazole Magnesium) .... Take 1 Tablet By Mouth Two Times A Day 3)  Align   Caps (Misc Intestinal Flora Regulat) .... One By Mouth Once Daily 4)  Metoprolol Tartrate 50 Mg Tabs (Metoprolol Tartrate) .... Take One Tablet By Mouth Twice A Day 5)  Glucosamine Chondroitin Complx   Caps (Glucosamine-Chondroit-Biofl-Mn) .... Take 1 Tablet By Mouth Once A Day 6)  Multivitamins   Tabs (Multiple Vitamin) .Marland Kitchen.. 1 Tab By Mouth Once Daily 7)  Lialda 1.2 Gm  Tbec (Mesalamine) .... Take 2 Tablet Every Morning 8)  Diltiazem Hcl Er Beads 120 Mg Xr24h-Cap (Diltiazem Hcl Er Beads) .... Take 1 Tablet By Mouth Two Times A Day 9)  Imodium A-D 2 Mg Tabs (Loperamide Hcl) .... As Needed 10)  Aspirin Ec Low Dose 81 Mg Tbec (Aspirin) .... Take 1 Tablet By Mouth Once A Day 11)  Losartan Potassium 50 Mg Tabs (Losartan Potassium) .... One By Mouth Two Times A Day 12)  Eq Chlortabs 4 Mg Tabs (Chlorpheniramine Maleate) .Marland Kitchen.. 1 By Mouth Daily As Needed 13)  Hydrochlorothiazide 12.5 Mg Tabs (Hydrochlorothiazide) .... Take One Tablet By Mouth Daily.  Allergies (verified): 1)  ! Codeine 2)  ! Mucinex Maximum Strength (Guaifenesin) 3)  ! * Latex 4)  ! * Sulfasalzine 5)  Asacol (Mesalamine)  Past History:  Past Medical History: Reviewed  history from 12/14/2009 and no changes required. 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  Balloon angioplasty was attempted; however, the        team was unable to cross with the wire.)  Depression GERD    Hyperlipidemia    Hypertension Crohn's Colitis  Right femoral pseudoaneurysm   OSA  Paroxysmal Afib Pancreatitis 2007 (biliary)  Hx of chronic sinusitis   Past Surgical History: Reviewed history from 12/14/2009 and no changes required. 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) Breast Biopsy;benign lesion (1996)  Cholecystectomy (10/2006) Tubal ligation       Rt Renal Artery Aneurysm Repair (1976)    Rt Femoral Pseudoaneurysm & Rt groin hematoma evacuation (02/2007)     Family History: Father deceased - history of colitis Mother - hypertension, CAD, and CVA Brother - history of CVA Family History of Colon Cancer:Paternal great aunt     allergies: mother asthma: daughter, maternal uncle  heart disease: mother father  clotting disorders: mother (stroke), brother (stroke), sister (stroke)          Review of Systems       The patient complains of irregular heartbeats, acid heartburn, tooth/dental problems, nasal congestion/difficulty breathing through nose, ear ache, and change in color of mucus.  The  patient denies shortness of breath with activity, shortness of breath at rest, productive cough, non-productive cough, coughing up blood, chest pain, indigestion, loss of appetite, weight change, abdominal pain, difficulty swallowing, sore throat, headaches, sneezing, itching, anxiety, depression, hand/feet swelling, joint stiffness or pain, rash, and fever.    Vital Signs:  Patient profile:   65 year old female Height:      64 inches Weight:      214.25 pounds BMI:     36.91 O2 Sat:      95 % on Room air Temp:     97.7 degrees F oral Pulse rate:   53 / minute BP sitting:   144 / 74  (left  arm) Cuff size:   large  Vitals Entered By: Arman Filter LPN (December 21, 2009 2:21 PM)  O2 Flow:  Room air Comments Medications reviewed with patient Arman Filter LPN  December 21, 2009 2:28 PM    Physical Exam  General:  ow female in nad Nose:  no skin breakdown or pressure necrosis from cpap mask Extremities:  minimal edema, no cyanosis Neurologic:  alert, not sleepy, moves all 4.   Impression & Recommendations:  Problem # 1:  SLEEP APNEA, OBSTRUCTIVE (ICD-327.23) the pt has been doing well with cpap, and feels that she sleeps well with adequate daytime alertness.  She reports no leak issues, and has been compliant with the device.  I suspect that her recent episode of afib was not triggered by osa, but it is nearly impossible to eliminate 100% of obstructive events in any sleep apnea patient due to so many night to night variables.  We can easily recheck her pressure with an autodevice at home, and will go ahead and schedule to make sure her pressure needs have not gone up or having increased mask leaks.  Other Orders: Est. Patient Level III ([address]) DME Referral (DME)  Patient Instructions: 1)  will recheck pressure again with autotitration device for 2 weeks with download 2)  continue to work on weight loss 3)  cancel apptm with me for the summer, and reschedule for one year followup.   4)  please call if having issues with cpap.  Appended Document: rov for osa megan, please let pt know that her pressure is more than adequate, and is not the cause of afib episodes by her download.  thanks.  Appended Document: rov for osa called and spoke with pt.  pt aware of KC's response.  pt verbalized understanding and denied any questions.

## 2010-11-23 NOTE — Letter (Signed)
Summary: [facility]  [facility]   Imported By: Lanelle Bal 06/16/2010 09:59:38  _____________________________________________________________________  External Attachment:    Type:   Image     Comment:   External Document

## 2010-11-23 NOTE — Progress Notes (Signed)
Summary: pt had a-fib about 1p today what to do?   Phone Note Call from Patient   Caller: [phone] Reason for Call: Talk to Nurse Summary of Call: pt in a-fib today about 1p, what to do? Initial call taken by: Glynda Jaeger,  September 06, 2010 2:24 PM  Follow-up for Phone Call        spoke with pt, she went into atrial fib around 1pm today. she cont in fib at present. she is unable to check her pulse but states it is going fast. she will take an extra 1/2 of metoprolol now to see if that will help with her heart rate. she is aware not to take another dose of metoprolol tonight before checking her pulse. if her pulse is below 60 she is not going to take amother metoprolol today. she denies chest pain, SOB or dizziness at present. she will call back later today if she cont with atrial fib. will foward for dr Jens Som review [name], RN  September 06, 2010 2:45 PM   Additional Follow-up for Phone Call Additional follow up Details #1::        If afib persists, can be seen in er or schedule f/u ov Ferman Hamming, MD, [facility]  September 07, 2010 9:17 AM  pt took extra pill for a-fib, continues to be in a-fib today-pls advise [phone]  Glynda Jaeger  September 07, 2010 9:56 AM    Additional Follow-up for Phone Call Additional follow up Details #2::    spoke with pt, she cont in atrial fib this am. her rate is in the 60-70's. she feels okay with no chest pain,SOB, or dizziness. follow up appt made. she will go to the er if her symptoms change [name], RN  September 07, 2010 10:05 AM

## 2010-11-23 NOTE — Assessment & Plan Note (Signed)
Summary: 2 MONTH FOLLOW UP/MHF   Vital Signs:  Patient profile:   65 year old female Weight:      208 pounds BMI:     35.83 Temp:     98.0 degrees F oral Pulse rate:   56 / minute Pulse rhythm:   regular Resp:     24 per minute BP sitting:   130 / 70  (right arm) Cuff size:   large  Vitals Entered By: Pearletha Furl CMA (December 14, 2009 8:58 AM) CC: room 3 2 month follow up Is Patient Diabetic? No  Does patient need assistance? Functional Status Social activities Comments Sore place under left great toe for 1 month. Pt restarted HCTZ by Dr. Jens Som and continues to have nightmares from medication.  Needs refill on Lexapro.   Primary Care Provider:  DThomos Lemons DO  CC:  room 3 2 month follow up.  History of Present Illness: 65 y/o white female for follow up Pt c/o sweling underneath left great toe.  no redness.  area is uncomfortable  Int hx: Had another bout of afib.  Consultant notes reviewed.  anti arrhythmic will be considered if she has recurrence  OSA- she reports using CPAP.    depression - stable on lexapro.  vivid dreams when she takes hctz  Preventive Screening-Counseling & Management  Alcohol-Tobacco     Smoking Status: never  Allergies: 1)  ! Codeine 2)  ! Mucinex Maximum Strength (Guaifenesin) 3)  ! * Latex 4)  ! 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) Breast Biopsy;benign lesion (1996)  Cholecystectomy (10/2006) Tubal ligation       Rt Renal  Artery Aneurysm Repair (1976)    Rt Femoral Pseudoaneurysm & Rt groin hematoma evacuation (02/2007)     Family History: Father deceased - history of colitis Mother - hypertension, CAD, and CVA Brother - history of CVA Family History of Colon Cancer:Paternal great aunt     allergies: mother asthma: daughter, maternal uncle  heart disease: mother clotting disorders: mother (stroke), brother (stroke), sister (stroke)          Social History: Married with 3 grown children  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  she is not sure if they are related to sinuses.  no purulent drainage  Physical Exam  General:  alert, well-developed, and well-nourished.   Neck:  supple and no masses.   Lungs:  normal respiratory effort, normal breath sounds, no crackles, and no wheezes.   Heart:  normal rate, regular rhythm, and no gallop.   Extremities:  trace left pedal edema and trace right pedal edema.   Neurologic:  cranial nerves II-XII intact and gait normal.   Psych:  normally interactive, good eye contact, not anxious appearing, and not depressed appearing.     Impression & Recommendations:  Problem # 1:  PAIN IN SOFT TISSUES OF LIMB (ICD-729.5)  Pt with swelling bottom of left great toe.  possible cyst.  I doubt abscess.   refer to podiatrist for further eval and tx  Orders: Podiatry Referral (Podiatry)  Problem # 2:  SLEEP APNEA, OBSTRUCTIVE (ICD-327.23) Assessment: Deteriorated  Pt getting more headaches.  she has recurrence of afib.  undertreated OSA may be trigger.  I suggest f/u with Dr. Shelle Iron  Orders: Sleep Disorder Referral (Sleep Disorder)  Problem # 3:  HYPERTENSION (ICD-401.9) Assessment: Improved BP improved with addition of hctz.  Maintain current medication regimen.  Her updated medication list for this problem includes:    Metoprolol Tartrate 50 Mg Tabs (Metoprolol tartrate) .Marland Kitchen... Take one  tablet by mouth twice a day    Diltiazem Hcl Er Beads 120 Mg Xr24h-cap (Diltiazem hcl er beads) .Marland Kitchen... Take 1 tablet by mouth two times a day    Losartan Potassium 50 Mg Tabs (Losartan potassium) ..... One by mouth two times a day    Hydrochlorothiazide 12.5 Mg Tabs (Hydrochlorothiazide) .Marland Kitchen... Take one tablet by mouth daily.  BP today: 130/70 Prior BP: 151/76 (12/10/2009)  Labs Reviewed: K+: 4.8 (09/29/2009) Creat: : 1.19 (09/29/2009)   Chol: 183 (01/07/2009)   HDL: 39.10 (01/07/2009)   LDL: 121 (01/07/2009)   TG: 113.0 (01/07/2009)  Complete Medication List: 1)  Lexapro 10 Mg Tabs (Escitalopram oxalate) .... One by mouth once daily 2)  Nexium 40 Mg Cpdr (Esomeprazole magnesium) .... Take 1 tablet by mouth two times a day 3)  Align Caps (Misc intestinal flora regulat) .... One by mouth once daily 4)  Metoprolol Tartrate 50 Mg Tabs (Metoprolol tartrate) .... Take one tablet by mouth twice a day 5)  Glucosamine Chondroitin Complx Caps (Glucosamine-chondroit-biofl-mn) .... Take 1 tablet by mouth once a day 6)  Multivitamins Tabs (Multiple vitamin) .Marland Kitchen.. 1 tab by mouth once daily 7)  Lialda 1.2 Gm Tbec (Mesalamine) .... Take 2 tablet every morning 8)  Diltiazem Hcl Er Beads 120 Mg Xr24h-cap (Diltiazem hcl er beads) .... Take 1 tablet by mouth two times a day 9)  Imodium A-d 2 Mg Tabs (Loperamide hcl) .... As needed 10)  Aspirin Ec Low Dose 81 Mg Tbec (Aspirin) .... Take 1 tablet by mouth once a day 11)  Losartan Potassium 50 Mg Tabs (Losartan potassium) .... One by mouth two times a day 12)  Eq Chlortabs 4 Mg Tabs (Chlorpheniramine maleate) .Marland Kitchen.. 1 by mouth daily as needed 13)  Hydrochlorothiazide 12.5 Mg Tabs (Hydrochlorothiazide) .... Take one tablet by mouth daily.  Patient Instructions: 1)  Please schedule a follow-up appointment in 3 months.  Current Allergies (reviewed today): ! CODEINE ! MUCINEX MAXIMUM STRENGTH (GUAIFENESIN) ! * LATEX ! * SULFASALZINE ASACOL (MESALAMINE)

## 2010-11-23 NOTE — Assessment & Plan Note (Signed)
Summary: per check out/sf  Medications Added METOPROLOL TARTRATE 25 MG TABS (METOPROLOL TARTRATE) Take one tablet by mouth twice a day        Referring Jermisha Hoffart:  Self Referral Primary Elaijah Munoz:  [name]   History of Present Illness: Pleasant female with past medical history of coronary artery disease and atrial fibrillation returns for followup. Last cardiac catheterization on Mar 06, 2007 showed an ejection fraction of 55%. There is nonobstructive plaque in the LAD, and circumflex and the right coronary artery was normal. 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 Echocardiogram in March of 2010 showed normal LV function, moderate biatrial enlargement and moderate tricuspid regurgitation. She has had a previous TEE guided cardioversion on Feb 27, 2009. Also note the patient is extremely sensitive to Coumadin.  Previous TSH is normal. Given that her atria have been enlarged, it has been felt that she will be high risk for recurrent atrial fibrillation. Carotid Dopplers in February of 2011 showed 0-39% stenosis bilaterally. I last saw her in Feb 2011. Since then she denies dyspnea or chest pain. She has had 2 episodes of atrial fibrillation. One lasted 3 hours and another approximately one day. Note when she converts she notices transient presyncope/near syncope. These symptoms resolve quickly.  Current Medications (verified): 1)  Lexapro 10 Mg  Tabs (Escitalopram Oxalate) .... One By Mouth Once Daily 2)  Nexium 40 Mg  Cpdr (Esomeprazole Magnesium) .... Take 1 Tablet By Mouth Two Times A Day 3)  Phillips Colon Health  Caps (Probiotic Product) .... Take 1 Capsule By Mouth Once A Day 4)  Metoprolol Tartrate 50 Mg Tabs (Metoprolol Tartrate) .... Take One Tablet By Mouth Twice A Day 5)  Glucosamine Chondroitin Complx   Caps (Glucosamine-Chondroit-Biofl-Mn) .... Take 1 Tablet By Mouth Once A Day 6)  Multivitamins   Tabs (Multiple Vitamin)  .Marland Kitchen.. 1 Tab By Mouth Once Daily 7)  Diltiazem Hcl Er Beads 120 Mg Xr24h-Cap (Diltiazem Hcl Er Beads) .... Take 1 Tablet By Mouth Two Times A Day 8)  Imodium A-D 2 Mg Tabs (Loperamide Hcl) .... As Needed 9)  Aspirin Ec Low Dose 81 Mg Tbec (Aspirin) .... Take 1 Tablet By Mouth Once A Day 10)  Losartan Potassium 50 Mg Tabs (Losartan Potassium) .... One By Mouth Two Times A Day 11)  Eq Chlortabs 4 Mg Tabs (Chlorpheniramine Maleate) .Marland Kitchen.. 1 By Mouth Daily As Needed 12)  Hydrochlorothiazide 12.5 Mg Tabs (Hydrochlorothiazide) .... Take One Half  Tablet By Mouth Daily.  Allergies: 1)  ! Codeine 2)  ! Mucinex Maximum Strength (Guaifenesin) 3)  ! * Latex 4)  ! * Sulfasalzine 5)  Asacol (Mesalamine)  Past History:  Past Medical History: Reviewed history from 03/08/2010 and no changes required. 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  Balloon angioplasty was attempted; however, the        team was unable to cross with the wire.)  Depression GERD     Hyperlipidemia    Hypertension Crohn's Colitis  Right femoral pseudoaneurysm   OSA  Paroxysmal Afib Pancreatitis 2007 (biliary)  Hx of chronic sinusitis  Solitary left kidney  Past Surgical History: Reviewed history from 03/08/2010 and no changes required. 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 Remaining systems are negative.   Vital Signs:  Patient profile:   65 year old female Height:      64 inches Weight:      212 pounds BMI:     36.52 Pulse rate:   46 / minute Resp:     14 per minute BP sitting:   140 / 90  (left arm)  Vitals Entered By: Kem Parkinson (June 04, 2010 8:32 AM)  Physical Exam  General:  Well-developed well-nourished in no acute distress.  Skin is warm and dry.  HEENT is normal.  Neck is supple. No thyromegaly.  Chest is clear to auscultation with normal expansion.  Cardiovascular exam is regular rate and rhythm.  Abdominal exam nontender or distended. No masses palpated. Extremities show no edema. neuro grossly intact    EKG  Procedure date:  06/04/2010  Findings:      Marked sinus bradycardia at a rate of 46. Axis normal. No ST changes.  Impression & Recommendations:  Problem # 1:  Hx of ATRIAL FIBRILLATION (ICD-427.31) Patient has had 2 separate episodes of atrial fibrillation. If these increase in frequency in the future we will consider an antiarrhythmic. Continue aspirin. Not on Coumadin given history of recurrent GI bleeding/ulcerative colitis. She is having episodes of dizziness when she breaks from atrial fibrillation. She is most likely having posttermination pauses. Her heart rate is 46 today. Decrease Toprol to 25 mg p.o. b.i.d. She may require a monitor and pacemaker in the future. Her updated medication list for this problem includes:    Metoprolol Tartrate 25 Mg Tabs (Metoprolol tartrate) .Marland Kitchen... Take one tablet by mouth twice a day    Aspirin Ec Low Dose 81 Mg Tbec (Aspirin) .Marland Kitchen... Take 1 tablet by mouth once a day  Problem # 2:  CAD, UNSPECIFIED SITE (ICD-414.00) Continue aspirin. Intolerant to statins. Her updated medication list for this problem includes:    Metoprolol Tartrate 25 Mg Tabs (Metoprolol tartrate) .Marland Kitchen... Take one tablet by mouth twice a day    Diltiazem Hcl Er Beads 120 Mg Xr24h-cap  (Diltiazem hcl er beads) .Marland Kitchen... Take 1 tablet by mouth two times a day    Aspirin Ec Low Dose 81 Mg Tbec (Aspirin) .Marland Kitchen... Take 1 tablet by mouth once a day  Problem # 3:  CROHN'S DISEASE-LARGE INTESTINE (VS. ULCERATIVE COLITIS) (ICD-555.1)  Problem # 4:  SLEEP APNEA, OBSTRUCTIVE (ICD-327.23)  Problem # 5:  HYPERLIPIDEMIA (ICD-272.4) Intolerant to statins.  Problem # 6:  HYPERTENSION (ICD-401.9) Blood pressure is mildly elevated but she states it typically runs 128/78. She will follow this and we will increase medications as needed. Her updated medication list for this problem includes:    Metoprolol Tartrate 25 Mg Tabs (Metoprolol tartrate) .Marland Kitchen... Take one tablet by mouth twice a day    Diltiazem Hcl Er Beads 120 Mg Xr24h-cap (Diltiazem hcl er beads) .Marland Kitchen... Take 1 tablet by mouth two times a day    Aspirin Ec Low Dose 81 Mg Tbec (Aspirin) .Marland KitchenMarland KitchenMarland KitchenMarland Kitchen  Take 1 tablet by mouth once a day    Losartan Potassium 50 Mg Tabs (Losartan potassium) ..... One by mouth two times a day    Hydrochlorothiazide 12.5 Mg Tabs (Hydrochlorothiazide) .Marland Kitchen... Take one half  tablet by mouth daily.  Patient Instructions: 1)  Your physician recommends that you schedule a follow-up appointment in: 6 MONTHS 2)  Your physician has recommended you make the following change in your medication: DECREASE METOPROLOL TO 25MG  ONE TABLET TWICE DAILY Prescriptions: METOPROLOL TARTRATE 25 MG TABS (METOPROLOL TARTRATE) Take one tablet by mouth twice a day  #60 x 12   Entered by:   [name], RN   Authorized by:   Ferman Hamming, MD, [facility]   Signed by:   [name], RN on 06/04/2010   Method used:   Electronically to        [facility].* (retail)       [address]       [place], [place]  [address]       Ph: [phone]       Fax: [phone]   RxID:   [id number]

## 2010-11-23 NOTE — Assessment & Plan Note (Signed)
Summary: consult for chronic cough   Copy to:  Dr. Annalee Genta Primary Provider/Referring Provider:  [name]  CC:  Pulmonary Consult.  History of Present Illness: the patient is a 65 year old female who I've been asked to see for persistent cough. The patient states that she has had a cough since June of this year, and it initially started as a sore throat. This led to a dry hacking cough that has been persistent in nature, and has not improved with rounds of antibiotics. She has had a chest x-ray 3 weeks ago which is unremarkable. She has seen otolaryngology, with no structural abnormality of the upper airway, but felt to have possible reflux disease that may be aggravating her cough. The patient has noted the cough worsens with prolonged conversation or laughter, and has intentionally limited her voice use and intensity of her conversation. She feels her cough is now down to just a few episodes a day, and clearly has improved. They start as a tickle in her throat and then lead to a cyclical cough mechanism to the point of dyspnea and almost vomiting. The patient does have some postnasal drip, and has a history of reflux disease. She was on Nexium once a day, and Dr. Annalee Genta has increased to b.i.d. dosing. She admits to having a globus sensation with throat clearing, as well as a sore throat with hoarseness.  Current Medications (verified): 1)  Lexapro 10 Mg  Tabs (Escitalopram Oxalate) .... One By Mouth Once Daily 2)  Nexium 40 Mg  Cpdr (Esomeprazole Magnesium) .... Take 1 Tablet By Mouth Two Times A Day 3)  Align   Caps (Misc Intestinal Flora Regulat) .... One By Mouth Once Daily 4)  Metoprolol Tartrate 50 Mg Tabs (Metoprolol Tartrate) .... Take One Tablet By Mouth Twice A Day 5)  Glucosamine Chondroitin Complx   Caps (Glucosamine-Chondroit-Biofl-Mn) .... Take 1 Tablet By Mouth Once A Day 6)  Multivitamins   Tabs (Multiple Vitamin) 7)  Lialda 1.2 Gm  Tbec (Mesalamine) .... Take 2 Tablet  Every Morning 8)  Diltiazem Hcl Er Beads 120 Mg Xr24h-Cap (Diltiazem Hcl Er Beads) .... Take 1 Tablet By Mouth Two Times A Day 9)  Imodium A-D 2 Mg Tabs (Loperamide Hcl) .... As Needed 10)  Aspirin Ec 325 Mg Tbec (Aspirin) .... Take One Tablet By Mouth Daily  Allergies (verified): 1)  ! Codeine 2)  ! Mucinex Maximum Strength (Guaifenesin) 3)  ! * Latex 4)  ! * Sulfasalzine 5)  Asacol (Mesalamine)  Past History:  Past Medical History: Reviewed history from 05/01/2009 and no changes required. 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  Balloon angioplasty was attempted; however, the        team was unable to cross with the wire.)  Depression GERD  Hyperlipidemia   Hypertension Crohn's Colitis  Right femoral pseudoaneurysm  OSA - can't tolerate CPAP mask Paroxysmal Afib Pancreatitis 2007 (biliary) Hx of chronic sinusitis  Past Surgical History: Reviewed history from 04/23/2009 and no changes required. 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 Father deceased - history of colitis Mother - hypertension, CAD, and CVA Brother - history of CVA Family History  of Colon Cancer:Paternal great aunt     allergies: mother asthma: daughter, maternal uncle heart disease: mother Strokes: paternal grandfather, maternal grandmother      Social History: Reviewed history from 05/01/2009 and no changes required. Married with 3 grown children Never Smoked retired. Alcohol use-no    Occupation: retired      Review of Systems       The patient complains of shortness of breath with activity, non-productive cough, irregular heartbeats, indigestion, sore  throat, nasal congestion/difficulty breathing through nose, and sneezing.  The patient denies shortness of breath at rest, productive cough, coughing up blood, chest pain, acid heartburn, loss of appetite, weight change, abdominal pain, difficulty swallowing, tooth/dental problems, headaches, itching, ear ache, anxiety, depression, hand/feet swelling, joint stiffness or pain, rash, change in color of mucus, and fever.    Vital Signs:  Patient profile:   65 year old female Height:      64.4 inches Weight:      210 pounds O2 Sat:      97 % on Room air Temp:     98.0 degrees F oral Pulse rate:   46 / minute BP sitting:   128 / 70  (left arm) Cuff size:   large  Vitals Entered By: Arman Filter LPN (May 20, 2009 1:53 PM)  O2 Flow:  Room air CC: Pulmonary Consult Comments Medications reviewed with patient Arman Filter LPN  May 20, 2009 1:53 PM    Physical Exam  General:  obese female in no acute distress Eyes:  PERRLA and EOMI.   Nose:  patent without discharge Mouth:  elongation of the soft palate and uvula with sidewall narrowing Neck:  no JVD, thyromegaly, or lymphadenopathy. Lungs:  clear to auscultation Heart:  regular rate and rhythm, 2/6 systolic murmur Abdomen:  soft and nontender, bowel sounds present Extremities:  mild right lower extremity edema, pulses intact distally Neurologic:  alert and oriented, moves all 4 extremities.   Pulmonary Function Test Date: 05/20/2009 Gender: Female  Pre-Spirometry FVC    Value: 2.30 L/min   Pred: 3.00 L/min     % Pred: 76 % FEV1    Value: 1.75 L     Pred: 2.19 L     % Pred: 79 % FEV1/FVC  Value: 76 %     Evaluation: normal  Impression & Recommendations:  Problem # 1:  COUGH, CHRONIC (ICD-786.2)  the patient's cough is clearly upper airway in origin, and definitely has a cyclical mechanism to it. It has improved significantly with behavioral changes the patient has learned on her own, and there may be a component of her  cough that is attributable to postnasal drip and reflux. I agree with continuing b.i.d. Nexium for at least another 4 weeks, but can be decreased back to her once daily dose when her cough resolves. I would also like her to try chlorpheniramine at bedtime. I discussed with her the possibility of trying the cyclical cough protocol, but she feels her cough is improving enough to continue with conservative treatment.  Medications Added to Medication List This Visit: 1)  Diltiazem Hcl Er Beads 120 Mg Xr24h-cap (Diltiazem hcl er beads) .... Take 1 tablet by mouth two times a day  Other Orders: Consultation Level IV ([address]) Spirometry w/Graph ([address])  Patient Instructions: 1)  continue with voice rest as much as possible. No singing, yelling, or extended conversations. 2)  Hard candy to bathe the back of your throat during the day.  Do not use peppermint  or mentholated drops 3)  chlorpheniramine 8mg  at bedtime for next few weeks. 4)  stay on nexium am and pm for next 4 weeks.  If cough resolves, can decrease back to once a day 5)  If cough doesn't resolve, let me know, and we can treat with cyclical cough protocol.

## 2010-11-23 NOTE — Progress Notes (Signed)
Summary: HEART OUT RHYTHM / NA at 10:45 am/JR   Phone Note Call from Patient Call back at Home Phone [phone]   Caller: Patient Summary of Call: PT SAID HER HEART IS OUT OF RHYTHM Initial call taken by: Judie Grieve,  December 09, 2009 10:00 AM  Follow-up for Phone Call        Attempted to call patient 2 times without success...no answer and no answering machine. Will try again later. Follow-up by: Suzan Garibaldi RN  Additional Follow-up for Phone Call Additional follow up Details #1::        Patient called back...she states that she has gone back into atrial fib. Heart rate is controlled at 80 with a BP of 153/92 per patient. Scheduled her to see Dr.Crenshaw tomorrow at 9 am. She also is complaining of urination times every 15 minutes without burning. Advised her to call her PCP with this problem. Additional Follow-up by: Suzan Garibaldi RN

## 2010-11-23 NOTE — Letter (Signed)
Summary: Patient Notice- Colon Biospy Results  [facility]  [address] [place], [place] [address]   Phone: [phone]  Fax: [phone]        December 01, 2009 MRN: [id number]    [facility] [address] [place], [place]  [address]    Dear Ms. Lanphier,  I am pleased to inform you that the biopsies taken during your recent colonoscopy did not show any evidence of active colitis upon pathologic examination. Your disease is currently in remission.  Continue with the treatment plan as outlined on the day of your      exam. stay on Lialda at current dose of 2.4 grams daily.  You should have a repeat colonoscopy examination for this problem           in 2 years.  Follow-up with me in the office in 1 year.  Please call us if you are having persistent problems or have questions about your condition that have not been fully answered at this time.  Sincerely,  Iva Boop MD, [facility]   This letter has been electronically signed by your physician.  Appended Document: Patient Notice- Colon Biospy Results letter mailed 2.11.11

## 2010-11-23 NOTE — Assessment & Plan Note (Signed)
Summary: 6 MONTH FU/DT   Vital Signs:  Patient profile:   65 year old female Height:      64 inches Weight:      221 pounds BMI:     38.07 O2 Sat:      96 % on Room air Temp:     98.4 degrees F oral Pulse rate:   51 / minute BP sitting:   124 / 70  (left arm) Cuff size:   large  Vitals Entered By: Glendell Docker CMA (September 13, 2010 9:15 AM)  O2 Flow:  Room air CC: 6 Month Follow up Is Patient Diabetic? No Pain Assessment Patient in pain? no        Primary Care Provider:  [name]  CC:  6 Month Follow up.  History of Present Illness:  Hypertension Follow-Up      This is a 65 year old woman who presents for Hypertension follow-up.  The patient denies lightheadedness.  mild lower ext edema.  The patient denies the following associated symptoms: chest pain.  Compliance with medications (by patient report) has been near 100%.  The patient reports that dietary compliance has been fair.    A fib - pt considering starting coumadin  Preventive Screening-Counseling & Management  Alcohol-Tobacco     Smoking Status: never  Allergies: 1)  ! Codeine 2)  ! Mucinex Maximum Strength (Guaifenesin) 3)  ! * Latex 4)  ! 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   Lungs:  normal respiratory effort, normal breath sounds, no crackles, and no wheezes.   Heart:  normal rate, regular rhythm, and no gallop.   Extremities:  trace left pedal edema and trace right pedal edema.   Neurologic:  cranial nerves II-XII intact and gait normal.   Psych:  normally interactive, good eye contact, not anxious appearing, and not depressed appearing.     Impression & Recommendations:  Problem # 1:  Hx of ATRIAL FIBRILLATION (ICD-427.31) I agree pt has multiple risk factor for embolism.  pt advised to fu with Dr. Jens Som for start of coumadin therapy pt was very sensitive to coumadin the past.  I suggest pt start at .5 mg dose  Her updated medication list for this problem includes:    Metoprolol Tartrate 50 Mg Tabs (Metoprolol tartrate) .Marland Kitchen... 1/2 tab by mouth two times a day    Diltiazem Hcl Er Beads 120 Mg Xr24h-cap (Diltiazem hcl er beads) .Marland Kitchen... Take 1 tablet by mouth two times a day    Aspirin Ec Low Dose 81 Mg Tbec (Aspirin) .Marland Kitchen... Take 1 tablet by mouth once a  day  Reviewed the following: PT: 35.9 (02/23/2009)   INR: 3.6 ratio (02/23/2009) Coumadin Dose (weekly): 14 mg (07/01/2009) Prior Coumadin Dose (weekly): 14 mg (07/01/2009) Next Protime: 04/23/2009 (dated on 03/26/2009)  Problem # 2:  HYPERTENSION (ICD-401.9) Assessment: Unchanged  Her updated medication list for this problem includes:    Metoprolol Tartrate 50 Mg Tabs (Metoprolol tartrate) .Marland Kitchen... 1/2 tab by mouth two times a day    Diltiazem Hcl Er Beads 120 Mg Xr24h-cap (Diltiazem hcl er beads) .Marland Kitchen... Take 1 tablet by mouth two times a day    Cozaar 25 Mg Tabs (Losartan potassium) .Marland Kitchen... Take 1 tablet by mouth two times a day    Hydrochlorothiazide 12.5 Mg Tabs (Hydrochlorothiazide) .Marland Kitchen... Take one half  tablet by mouth daily.  BP today: 124/70 Prior BP: 130/80 (09/09/2010)  Labs Reviewed: K+: 4.6 (09/06/2010) Creat: : 1.17  (09/06/2010)   Chol: 183 (01/07/2009)   HDL: 39.10 (01/07/2009)   LDL: 121 (01/07/2009)   TG: 113.0 (01/07/2009)  Complete Medication List: 1)  Lexapro 10 Mg Tabs (Escitalopram oxalate) .... One by mouth once daily 2)  Nexium 40 Mg Cpdr (Esomeprazole magnesium) .... Take 1 tablet by mouth two times a day 3)  Phillips Colon Health Caps (Probiotic product) .... Take 1 capsule by mouth once a day 4)  Metoprolol Tartrate 50 Mg Tabs (Metoprolol tartrate) .... 1/2 tab by mouth two times a day 5)  Glucosamine Chondroitin Complx Caps (Glucosamine-chondroit-biofl-mn) .... Take 1 tablet by mouth once a day 6)  Multivitamins Tabs (Multiple vitamin) .Marland Kitchen.. 1 tab by mouth once daily 7)  Diltiazem Hcl Er Beads 120 Mg Xr24h-cap (Diltiazem hcl er beads) .... Take 1 tablet by mouth two times a day 8)  Imodium A-d 2 Mg Tabs (Loperamide hcl) .... As needed 9)  Aspirin Ec Low Dose 81 Mg Tbec (Aspirin) .... Take 1 tablet by mouth once a day 10)  Cozaar 25 Mg Tabs (Losartan potassium) .... Take 1 tablet by mouth two times a day 11)  Eq Chlortabs 4 Mg Tabs (Chlorpheniramine maleate) .Marland Kitchen.. 1 by mouth daily as needed 12)  Hydrochlorothiazide 12.5 Mg Tabs (Hydrochlorothiazide) .... Take one half  tablet by mouth daily. 13)  Fluticasone Propionate 50 Mcg/act Susp (Fluticasone propionate) .... 2 sprays each nostril once daily  Other Orders: Pneumococcal Vaccine ([address]) Admin 1st Vaccine ([address])  Patient Instructions: 1)  Please schedule a follow-up appointment in 6 months.   Orders Added: 1)  Pneumococcal Vaccine [90732] 2)  Admin 1st Vaccine [90471] 3)  Est. Patient Level III [[address]]   Immunization History:  Influenza Immunization History:    Influenza:  historical (08/03/2010)  Immunizations Administered:  Pneumonia Vaccine:    Vaccine Type: Pneumovax (Medicare)    Site: left deltoid    Mfr: Merck    Dose: 0.5 ml    Route: IM    Given by: Glendell Docker CMA    Exp. Date: 02/18/2012    Lot #:  [id number]    VIS given: 09/28/09 version given September 13, 2010.   Immunization History:  Influenza Immunization History:    Influenza:  Historical (08/03/2010)  Immunizations Administered:  Pneumonia Vaccine:    Vaccine Type: Pneumovax (Medicare)    Site: left deltoid    Mfr: Merck    Dose: 0.5 ml    Route: IM    Given by: Glendell Docker CMA    Exp. Date: 02/18/2012    Lot #: [id number]    VIS given: 09/28/09 version given September 13, 2010.   Current  Allergies (reviewed today): ! CODEINE ! MUCINEX MAXIMUM STRENGTH (GUAIFENESIN) ! * LATEX ! * SULFASALZINE ASACOL (MESALAMINE)

## 2010-11-25 NOTE — Progress Notes (Signed)
Summary: change of meds and re fill  Medications Added METOPROLOL TARTRATE 25 MG TABS (METOPROLOL TARTRATE) two times a day       Phone Note Refill Request Message from:  Patient on October 12, 2010 3:22 PM  Refills Requested: Medication #1:  METOPROLOL TARTRATE 50 MG TABS 1/2 tab by mouth two times a day pt wants to know if the dr can call in 25mg  instead of 50mg  so she does not have to break them in half.  Pharmacy # [phone]   Method Requested: Telephone to Pharmacy Initial call taken by: Roe Coombs,  October 12, 2010 3:22 PM  Follow-up for Phone Call        adv pt that prescription sent for 25mg  tabs. Follow-up by: Claris Gladden RN,  October 12, 2010 4:01 PM    New/Updated Medications: METOPROLOL TARTRATE 25 MG TABS (METOPROLOL TARTRATE) two times a day Prescriptions: METOPROLOL TARTRATE 25 MG TABS (METOPROLOL TARTRATE) two times a day  #180 x 3   Entered by:   Claris Gladden RN   Authorized by:   Ferman Hamming, MD, [facility]   Signed by:   Claris Gladden RN on 10/12/2010   Method used:   Electronically to        [facility] (retail)       [address]       [place], [place]  [address]       Ph: [phone]       Fax: [phone]   RxID:   [id number]

## 2010-11-29 ENCOUNTER — Ambulatory Visit (INDEPENDENT_AMBULATORY_CARE_PROVIDER_SITE_OTHER): Payer: Medicare Other | Admitting: Cardiology

## 2010-11-29 ENCOUNTER — Encounter: Payer: Self-pay | Admitting: Cardiology

## 2010-11-29 DIAGNOSIS — I4891 Unspecified atrial fibrillation: Secondary | ICD-10-CM

## 2010-11-29 DIAGNOSIS — I2510 Atherosclerotic heart disease of native coronary artery without angina pectoris: Secondary | ICD-10-CM

## 2010-11-30 ENCOUNTER — Telehealth: Payer: Self-pay | Admitting: Internal Medicine

## 2010-12-01 NOTE — Assessment & Plan Note (Signed)
Summary: SINUS INFECTION/HEA   Vital Signs:  Patient profile:   65 year old female Height:      64 inches Weight:      224.75 pounds BMI:     38.72 O2 Sat:      96 % on Room air Temp:     98.5 degrees F oral Pulse rate:   59 / minute Resp:     18 per minute BP sitting:   130 / 80  (left arm) Cuff size:   large  Vitals Entered By: Glendell Docker CMA (November 08, 2010 11:34 AM)  O2 Flow:  Room air CC: Cough   Primary Care Provider:  [name]  CC:  Cough.  History of Present Illness: seen at urgent care 10/24/2010 for URI tx ed with cefdinir 300 two times a day.  she took abx x 7 days  yest symptoms came back started with nasal drainage throat felt raw yellow nasal discharge  no sob no fever    Allergies: 1)  ! Codeine 2)  ! Mucinex Maximum Strength (Guaifenesin) 3)  ! * Latex 4)  ! 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) Breast Biopsy;benign lesion (1996)  Cholecystectomy (10/2006)  Tubal ligation         Rt Renal Artery Aneurysm Repair (1976)     Rt Femoral Pseudoaneurysm & Rt groin hematoma evacuation (02/2007)     Family History: Father deceased - history of colitis Mother - hypertension, CAD, and CVA Brother - history of CVA Family History of Colon Cancer:Paternal great aunt     allergies: mother asthma: daughter, maternal uncle  heart disease: mother father  clotting disorders: mother (stroke), brother (stroke), sister  (stroke)              Social History: Married with 3 grown children  Never Smoked  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   Lungs:  normal respiratory effort and normal breath sounds.   Heart:  normal rate, regular rhythm, and no gallop.     Impression & Recommendations:  Problem # 1:  ACUTE SINUSITIS, UNSPECIFIED (ICD-461.9) Assessment Deteriorated Pt with symptoms of chronic sinusitis.   extend course of abx Patient advised to call office if symptoms persist or worsen.  Her updated medication list for this problem includes:    Fluticasone Propionate 50 Mcg/act Susp (Fluticasone propionate) .Marland Kitchen... 2 sprays each nostril once daily    Cefuroxime Axetil 500 Mg Tabs (Cefuroxime axetil) ..... One by mouth two times a day  Complete Medication List: 1)  Lexapro 10 Mg Tabs (Escitalopram oxalate) .... One by mouth once daily 2)  Nexium 40 Mg Cpdr (Esomeprazole magnesium) .... Take 1 tablet by mouth two times a day 3)  Phillips Colon Health Caps (Probiotic product) .... Take 1  capsule by mouth once a day 4)  Metoprolol Tartrate 25 Mg Tabs (Metoprolol tartrate) .... Two times a day 5)  Glucosamine Chondroitin Complx Caps (Glucosamine-chondroit-biofl-mn) .... Take 1 tablet by mouth once a day 6)  Multivitamins Tabs (Multiple vitamin) .Marland Kitchen.. 1 tab by mouth once daily 7)  Diltiazem Hcl Er Beads 120 Mg Xr24h-cap (Diltiazem hcl er beads) .... Take 1 tablet by mouth two times a day 8)  Imodium A-d 2 Mg Tabs (Loperamide hcl) .... As needed 9)  Aspirin Ec Low Dose 81 Mg Tbec (Aspirin) .... Take 1 tablet by mouth once a day 10)  Cozaar 25 Mg Tabs (Losartan potassium) .... Take 1 tablet by mouth two times a day 11)  Eq Chlortabs 4 Mg Tabs (Chlorpheniramine maleate) .Marland Kitchen.. 1 by mouth daily as needed 12)  Hydrochlorothiazide 12.5 Mg Tabs (Hydrochlorothiazide) .... Take one half  tablet by mouth daily. 13)  Fluticasone  Propionate 50 Mcg/act Susp (Fluticasone propionate) .... 2 sprays each nostril once daily 14)  Cefuroxime Axetil 500 Mg Tabs (Cefuroxime axetil) .... One by mouth two times a day  Patient Instructions: 1)  Call our office if your symptoms do not  improve or gets worse. Prescriptions: CEFUROXIME AXETIL 500 MG TABS (CEFUROXIME AXETIL) one by mouth two times a day  #28 x 0   Entered and Authorized by:   D. Thomos Lemons DO   Signed by:   D. Thomos Lemons DO on 11/08/2010   Method used:   Print then Give to Patient   RxID:   [id number]    Orders Added: 1)  Est. Patient Level III [86578]    Current Allergies (reviewed today): ! CODEINE ! MUCINEX MAXIMUM STRENGTH (GUAIFENESIN) ! * LATEX ! * SULFASALZINE ASACOL (MESALAMINE)

## 2010-12-06 ENCOUNTER — Encounter: Payer: Self-pay | Admitting: Internal Medicine

## 2010-12-09 NOTE — Progress Notes (Signed)
Summary: RX transfer: nexium, lexapro  Phone Note Call from Patient Call back at Home Phone [phone] P PH     Caller: Patient Call For: D. Thomos Lemons DO Summary of Call: Received voice message from pt requesting Rxs for Nexium and Lexapro be transferred to [facility] in [place].  Initial call taken by: Mervin Kung CMA Duncan Dull),  November 30, 2010 4:51 PM    Prescriptions: NEXIUM 40 MG  CPDR (ESOMEPRAZOLE MAGNESIUM) Take 1 tablet by mouth two times a day  #180 x 1   Entered by:   Mervin Kung CMA (AAMA)   Authorized by:   D. Thomos Lemons DO   Signed by:   Mervin Kung CMA (AAMA) on 11/30/2010   Method used:   Electronically to        [facility] [phone]* (retail)       [address]       [place], [place]  [address]       Ph: [phone] or [phone]       Fax: [phone]   RxID:   [id number] LEXAPRO 10 MG  TABS (ESCITALOPRAM OXALATE) one by mouth once daily  #90 x 1   Entered by:   Mervin Kung CMA (AAMA)   Authorized by:   D. Thomos Lemons DO   Signed by:   Mervin Kung CMA (AAMA) on 11/30/2010   Method used:   Electronically to        [facility]. [phone]* (retail)       [address]       [place], [place]  [address]       Ph: [phone] or [phone]       Fax: [phone]   RxID:   [id number]

## 2010-12-09 NOTE — Assessment & Plan Note (Signed)
Summary: f13m/dm      Allergies Added:   Visit Type:  Follow-up Referring Provider:  Self Referral Primary Provider:  [name]  CC:  no complaints.  History of Present Illness: Pleasant female with past medical history of coronary artery disease and atrial fibrillation returns for followup. Last cardiac catheterization on Mar 06, 2007 showed an ejection fraction of 55%. There is nonobstructive plaque in the LAD, and circumflex and the right coronary artery was normal. 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 Echocardiogram in March of 2010 showed normal LV function, moderate biatrial enlargement and moderate tricuspid regurgitation. She has had a previous TEE guided cardioversion on Feb 27, 2009. Also note the patient is extremely sensitive to Coumadin.  Previous TSH is normal. Given that her atria have been enlarged, it has been felt that she will be high risk for recurrent atrial fibrillation. Carotid Dopplers in February of 2011 showed 0-39% stenosis bilaterally. I last saw her in November of 2011. Since then she had one brief episode of atrial fibrillation several weeks ago. She had palpitations but no chest pain or shortness of breath. Her episode lasted approximately 8 hours and resolve spontaneously. She otherwise denies dyspnea on exertion, chest pain or syncope.  Current Medications (verified): 1)  Lexapro 10 Mg  Tabs (Escitalopram Oxalate) .... One By Mouth Once Daily 2)  Nexium 40 Mg  Cpdr (Esomeprazole Magnesium) .... Take 1 Tablet By Mouth Two Times A Day 3)  Phillips Colon Health  Caps (Probiotic Product) .... Take 1 Capsule By Mouth Once A Day 4)  Metoprolol Tartrate 25 Mg Tabs (Metoprolol Tartrate) .... Two Times A Day 5)  Glucosamine Chondroitin Complx   Caps (Glucosamine-Chondroit-Biofl-Mn) .... Take 1 Tablet By Mouth Once A Day 6)  Multivitamins   Tabs (Multiple Vitamin) .Marland Kitchen.. 1 Tab By Mouth Once Daily 7)  Diltiazem  Hcl Er Beads 120 Mg Xr24h-Cap (Diltiazem Hcl Er Beads) .... Take 1 Tablet By Mouth Two Times A Day 8)  Imodium A-D 2 Mg Tabs (Loperamide Hcl) .... As Needed 9)  Aspirin Ec Low Dose 81 Mg Tbec (Aspirin) .... Take 1 Tablet By Mouth Once A Day 10)  Cozaar 25 Mg Tabs (Losartan Potassium) .... Take 1 Tablet By Mouth Two Times A Day 11)  Eq Chlortabs 4 Mg Tabs (Chlorpheniramine Maleate) .Marland Kitchen.. 1 By Mouth Daily As Needed 12)  Hydrochlorothiazide 12.5 Mg Tabs (Hydrochlorothiazide) .... Take One Half  Tablet By Mouth Daily. 13)  Fluticasone Propionate 50 Mcg/act Susp (Fluticasone Propionate) .... 2 Sprays Each Nostril Once Daily  Allergies (verified): 1)  ! Codeine 2)  ! Mucinex Maximum Strength (Guaifenesin) 3)  ! * Latex 4)  ! * Sulfasalzine 5)  Asacol (Mesalamine)  Past History:  Past Medical History: Reviewed history from 11/08/2010 and no changes required. 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  Balloon angioplasty was attempted; however, the        team was unable to cross with the wire.)  Depression GERD      Hyperlipidemia     Hypertension  Crohn's Colitis  Right femoral pseudoaneurysm   OSA  Paroxysmal Afib Pancreatitis 2007 (biliary)  Hx of chronic sinusitis  Solitary left kidney  Past Surgical History: Reviewed history from 11/08/2010 and no changes required. 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 Remaining systems are negative.   Vital Signs:  Patient profile:   65 year old female Height:      64 inches Weight:      226.50 pounds BMI:     39.02 Pulse rate:   58 / minute BP sitting:   142 / 86  (left arm) Cuff size:   large  Vitals Entered By: Caralee Ates CMA (November 29, 2010 9:05 AM)  Physical Exam  General:  Well-developed well-nourished in no acute distress.  Skin is warm and dry.  HEENT is normal.  Neck is supple. No thyromegaly.  Chest is clear to auscultation with normal expansion.  Cardiovascular exam is regular rate and rhythm.  Abdominal exam nontender or distended. No masses palpated. Extremities show no edema. neuro grossly intact    Impression & Recommendations:  Problem # 1:  Hx of ATRIAL FIBRILLATION (ICD-427.31) Patient had one brief episode of atrial fibrillation since been seen previously. We will continue with medical therapy. I will add an antiarrhythmic such as amiodarone or tikosyn in the future if her episodes become more frequent. Continue beta blocker and calcium blocker. Continue aspirin. She declines Coumadin and would also be at higher risk given her history of colitis. She understands the risk of CVA. Her updated medication list for this problem includes:    Metoprolol Tartrate 25 Mg Tabs (Metoprolol tartrate) .Marland Kitchen..Marland Kitchen Two times a day    Aspirin Ec Low Dose 81 Mg Tbec (Aspirin) .Marland Kitchen... Take 1 tablet by mouth once a day  Problem # 2:  CAD, UNSPECIFIED SITE (ICD-414.00) Continue aspirin and beta blocker. Intolerant to statins. Her updated medication list for this problem includes:    Metoprolol Tartrate 25 Mg Tabs (Metoprolol tartrate) .Marland Kitchen..Marland Kitchen Two times a day    Diltiazem Hcl Er Beads 120 Mg Xr24h-cap (Diltiazem hcl er beads) .Marland Kitchen... Take 1 tablet by mouth two times a day    Aspirin Ec Low Dose 81 Mg Tbec (Aspirin) .Marland Kitchen... Take 1 tablet by mouth once a  day  Problem # 3:  CROHN'S DISEASE-LARGE INTESTINE (VS. ULCERATIVE COLITIS) (ICD-555.1)  Problem # 4:  HYPERLIPIDEMIA (ICD-272.4) Plan diet. Intolerant to statins.  Problem # 5:  HYPERTENSION (ICD-401.9) Blood pressure controlled. Continue present medications. Her updated medication list for this problem includes:    Metoprolol Tartrate 25 Mg Tabs (Metoprolol tartrate) .Marland Kitchen..Marland Kitchen Two times a day    Diltiazem Hcl Er Beads 120 Mg Xr24h-cap (Diltiazem hcl er beads) .Marland Kitchen... Take 1 tablet by mouth two times a day    Aspirin Ec Low Dose 81 Mg Tbec (Aspirin) .Marland Kitchen... Take 1 tablet by mouth once a day    Cozaar 25 Mg Tabs (Losartan potassium) .Marland Kitchen... Take 1 tablet by mouth two times a day    Hydrochlorothiazide 12.5 Mg Tabs (Hydrochlorothiazide) .Marland Kitchen... Take one half  tablet by mouth daily.  Problem # 6:  SLEEP APNEA, OBSTRUCTIVE (ICD-327.23)  Patient Instructions: 1)  Your physician wants you to follow-up in: 6 MONTHS  You will receive a reminder letter in the mail two months in advance. If you don't receive a letter, please call our office to schedule the follow-up appointment.

## 2010-12-15 NOTE — Letter (Signed)
Summary: Colonoscopy-Changed to Office Visit Letter  [facility]  [address]   [place], [place] [address]   Phone: [phone]  Fax: [phone]      December 06, 2010 MRN: [id number]   [facility] [address] [place], [place]  [address]   Dear Ms. [facility],   According to our records, it is time for you to schedule a Colonoscopy. However, after reviewing your medical record, I feel that an office visit would be most appropriate to more completely evaluate you and determine your need for a repeat procedure.  Please call [phone] (option #2) at your convenience to schedule an office visit. If you have any questions, concerns, or feel that this letter is in error, we would appreciate your call.   Sincerely,   Stan Head, M.D.  [facility] [phone]

## 2010-12-16 ENCOUNTER — Telehealth: Payer: Self-pay | Admitting: Cardiology

## 2010-12-21 ENCOUNTER — Encounter: Payer: Self-pay | Admitting: Pulmonary Disease

## 2010-12-21 ENCOUNTER — Ambulatory Visit (INDEPENDENT_AMBULATORY_CARE_PROVIDER_SITE_OTHER): Payer: Medicare Other | Admitting: Pulmonary Disease

## 2010-12-21 DIAGNOSIS — G4733 Obstructive sleep apnea (adult) (pediatric): Secondary | ICD-10-CM

## 2010-12-21 NOTE — Progress Notes (Signed)
Summary: pt had chest pain today  Medications Added DILTIAZEM HCL ER BEADS 120 MG XR24H-CAP (DILTIAZEM HCL ER BEADS) take 1 capsule twice a day NITROSTAT 0.4 MG SUBL (NITROGLYCERIN) take 1 tab undre tongue  every 5 minutes for CP--not to exceed 3 tabs in 15 minutes       Phone Note Call from Patient   Caller: Patient Reason for Call: Talk to Nurse, Talk to Doctor Complaint: Breathing Problems Summary of Call: pt recently has changed Pharm and they are using a diffrent diltiazem and since she has been using it she has experienced chest pain, not severe and it is relieved by taking ASA.  Initial call taken by: Omer Jack,  December 16, 2010 4:25 PM  Follow-up for Phone Call        12/16/10--1700pm--pt calling stating since starting Diltiazem from [facility](she used to use [facility]) she has been constipated and having episodes of CP--symptoms are CP at a level 3 out of 10,pain in back of neck, is relieved by short acting ASA and a tylenol--on and off during day--spoke with dera m. and decided to start NTG and order new rx for diltiazem from different pharmacy with only i month supply--pt agrees--rx sent via computer Follow-up by: Ledon Snare, RN,  December 16, 2010 5:08 PM    New/Updated Medications: DILTIAZEM HCL ER BEADS 120 MG XR24H-CAP (DILTIAZEM HCL ER BEADS) take 1 capsule twice a day NITROSTAT 0.4 MG SUBL (NITROGLYCERIN) take 1 tab undre tongue  every 5 minutes for CP--not to exceed 3 tabs in 15 minutes Prescriptions: NITROSTAT 0.4 MG SUBL (NITROGLYCERIN) take 1 tab undre tongue  every 5 minutes for CP--not to exceed 3 tabs in 15 minutes  #25 x 2   Entered by:   Ledon Snare, RN   Authorized by:   Ferman Hamming, MD, [facility]   Signed by:   Ledon Snare, RN on 12/16/2010   Method used:   Electronically to        [facility]  [address] [phone]* (retail)       [address]       [place], [place]  [address]       Ph: [phone] or [phone]       Fax:  [phone]   RxID:   [id number] DILTIAZEM HCL ER BEADS 120 MG XR24H-CAP (DILTIAZEM HCL ER BEADS) take 1 capsule twice a day  #60 x 0   Entered by:   Ledon Snare, RN   Authorized by:   Ferman Hamming, MD, [facility]   Signed by:   Ledon Snare, RN on 12/16/2010   Method used:   Electronically to        [facility]  [address] [phone]* (retail)       [address]       [place], [place]  [address]       Ph: [phone] or [phone]       Fax: [phone]   RxID:   [id number]

## 2011-01-04 NOTE — Assessment & Plan Note (Signed)
Summary: rov for osa    Copy to:  Self Referral Primary Provider/Referring Provider:  [name]  CC:  Pt here for sleep follow-up.  Pt is wearing cpap 9 hours every night. Pt denies any problems with mask or machine.  Pt doe smentionthat she has been having some chest pain that comes and goes. She has addressed with cardiology. Marland Kitchen  History of Present Illness: The pt comes in today for f/u of her known osa.  She is wearing cpap compliantly, and reports no issues with her cpap machine or mask fit.  She feels she is sleeping well, and reports satisfactory daytime alertness.    Current Medications (verified): 1)  Lexapro 10 Mg  Tabs (Escitalopram Oxalate) .... One By Mouth Once Daily 2)  Nexium 40 Mg  Cpdr (Esomeprazole Magnesium) .... Take 1 Tablet By Mouth Two Times A Day 3)  Phillips Colon Health  Caps (Probiotic Product) .... Take 1 Capsule By Mouth Once A Day 4)  Metoprolol Tartrate 25 Mg Tabs (Metoprolol Tartrate) .... Two Times A Day 5)  Glucosamine Chondroitin Complx   Caps (Glucosamine-Chondroit-Biofl-Mn) .... Take 1 Tablet By Mouth Once A Day 6)  Multivitamins   Tabs (Multiple Vitamin) .Marland Kitchen.. 1 Tab By Mouth Once Daily 7)  Diltiazem Hcl Er Beads 120 Mg Xr24h-Cap (Diltiazem Hcl Er Beads) .... Take 1 Tablet By Mouth Two Times A Day 8)  Imodium A-D 2 Mg Tabs (Loperamide Hcl) .... As Needed 9)  Aspirin Ec Low Dose 81 Mg Tbec (Aspirin) .... Take 1 Tablet By Mouth Once A Day 10)  Cozaar 25 Mg Tabs (Losartan Potassium) .... Take 1 Tablet By Mouth Two Times A Day 11)  Eq Chlortabs 4 Mg Tabs (Chlorpheniramine Maleate) .Marland Kitchen.. 1 By Mouth Daily As Needed 12)  Hydrochlorothiazide 12.5 Mg Tabs (Hydrochlorothiazide) .... Take One Half  Tablet By Mouth Daily. 13)  Nitrostat 0.4 Mg Subl (Nitroglycerin) .... Take 1 Tab Undre Tongue  Every 5 Minutes For Cp--Not To Exceed 3 Tabs in 15 Minutes  Allergies (verified): 1)  ! Codeine 2)  ! Mucinex Maximum Strength (Guaifenesin) 3)  ! * Latex 4)  ! *  Sulfasalzine 5)  Asacol (Mesalamine)  Review of Systems       The patient complains of chest pain.  The patient denies shortness of breath with activity, shortness of breath at rest, productive cough, non-productive cough, coughing up blood, irregular heartbeats, acid heartburn, indigestion, loss of appetite, weight change, abdominal pain, difficulty swallowing, sore throat, tooth/dental problems, headaches, nasal congestion/difficulty breathing through nose, sneezing, itching, ear ache, anxiety, depression, hand/feet swelling, joint stiffness or pain, rash, change in color of mucus, and fever.    Vital Signs:  Patient profile:   65 year old female Height:      64 inches Weight:      228.25 pounds O2 Sat:      97 % on Room air Temp:     98.0 degrees F oral Pulse rate:   58 / minute BP sitting:   134 / 80  (right arm) Cuff size:   large  Vitals Entered By: Arman Filter LPN (December 21, 2010 11:11 AM)  O2 Flow:  Room air CC: Pt here for sleep follow-up.  Pt is wearing cpap 9 hours every night. Pt denies any problems with mask or machine.  Pt doe smentionthat she has been having some chest pain that comes and goes. She has addressed with cardiology.  Comments Medications reviewed with patient  Carron Curie, CMA  December 21, 2010 11:15 AM     Physical Exam  General:  obese female in nad Nose:  no skin breakdown or pressure necrosis from cpap mask Extremities:  minimal edema, no cyanosis  Neurologic:  alert, does not appear sleepy, moves all 4    Impression & Recommendations:  Problem # 1:  SLEEP APNEA, OBSTRUCTIVE (ICD-327.23) the pt is doing well with cpap, and feels it is controlling her symptoms.  I have asked her to keep up with mask changes, supplies, and maintenance on her machine.  I have also encouraged her to work aggressively on weight loss.    Other Orders: Est. Patient Level III (81191)  Patient Instructions: 1)  continue with cpap 2)  work on weight  loss 3)  followup with me in 12mos.

## 2011-01-11 ENCOUNTER — Ambulatory Visit (INDEPENDENT_AMBULATORY_CARE_PROVIDER_SITE_OTHER): Payer: Medicare Other | Admitting: Internal Medicine

## 2011-01-11 ENCOUNTER — Encounter: Payer: Self-pay | Admitting: Internal Medicine

## 2011-01-11 DIAGNOSIS — Z9119 Patient's noncompliance with other medical treatment and regimen: Secondary | ICD-10-CM

## 2011-01-11 DIAGNOSIS — Z91148 Patient's other noncompliance with medication regimen for other reason: Secondary | ICD-10-CM | POA: Insufficient documentation

## 2011-01-11 DIAGNOSIS — K219 Gastro-esophageal reflux disease without esophagitis: Secondary | ICD-10-CM

## 2011-01-11 DIAGNOSIS — Z9114 Patient's other noncompliance with medication regimen: Secondary | ICD-10-CM

## 2011-01-11 DIAGNOSIS — K5010 Crohn's disease of large intestine without complications: Secondary | ICD-10-CM | POA: Insufficient documentation

## 2011-01-11 MED ORDER — MESALAMINE 1.2 G PO TBEC: 1200.0000 mg | DELAYED_RELEASE_TABLET | Freq: Two times a day (BID) | ORAL | Status: DC

## 2011-01-11 NOTE — Progress Notes (Signed)
Subjective:    Patient ID: [name], female    DOB: 07-21-1946, 65 y.o.   MRN: [id number]  HPI Comments: Yo woman with Crohn's colitis. At colonoscopy 1 year ago she had no evidence of disease. Since then she stopped taking Lialda due to an occurrence of diarrhea that she attributed to Lialda. Has not taken in several months. She does not have abdominal cramps or pain except with bending way over or straighteing up quickly. Daily bowel movements, no constipation or diarhea now and no bleeding.  No heartburn on Nexium 40 mg bid. Occasional cough helped by chlorpheniramine. Overall has helped cough greatly.    Past Medical History  Diagnosis Date  . CAD (coronary artery disease)   . Depression   . GERD (gastroesophageal reflux disease)   . Hyperlipidemia   . Hypertension   . Crohn's colitis     she reports ulcerative colitis beginning in her 37's, biopsies 2008 suggest Crohn's not UC  . Pseudoaneurysm of right femoral artery   . Pancreatitis   . Chronic sinusitis   . Renal oncocytoma   . Myocardial infarction   . Uveitis   . Anxiety   . Obesity   . Chronic cough    Past Surgical History  Procedure Date  . Nephrectomy     right  . Breast biopsy   . Cholecystectomy   . Tubal ligation     reports that she has never smoked. She has never used smokeless tobacco. She reports that she does not drink alcohol or use illicit drugs. family history includes Asthma in her daughter; Clotting disorder in her brother, mother, and sister; Colitis in her father; Colon cancer in her paternal aunt; Coronary artery disease in her mother; Heart disease in her father and mother; Hypertension in her mother; and Stroke in her brother and mother. Allergies  Allergen Reactions  . Codeine   . Latex          Review of Systems Itchy bumps left neck area, in region of pior shingles Chest pain with constipation when she ran out of diltiazem x 2-3 days gone when back on diltiazem      Objective:   Physical Exam  Constitutional: She appears well-developed and well-nourished.  Eyes: No scleral icterus.  Cardiovascular: Normal rate, regular rhythm and normal heart sounds.   Pulmonary/Chest: Breath sounds normal.  Abdominal: Soft. She exhibits no mass. There is no tenderness.  Skin:       Scaly pigmented lesions in neck and shoulder area - chronic sun-related lesions notihg that looks suspicious- recommended she see dermatology          Assessment & Plan:   Problem List as of 01/11/2011          Gastroenterology Problems   Crohn's colitis   Last Assessment & Plan Note   01/11/2011 Office Visit Signed 01/11/2011 11:42 AM by Iva Boop, MD     She is currently asymptomatic. Space Note that she was reportedly diagnosed with ulcerative colitis in her 30s. Space She had many years of intermittent problems. Space I met her around 2007. Space colonoscopy in 2008 suggested that she actually had Crohn's colitis.  Lialda  Was started and she was doing well.  She stopped this after transient diarrhea.  Denies that cost is an issue.  Noncompliance has been an issue off and on over time.    I explained the need for chronic therapy 2 reduce disease flares. Space She acknowledged this and said  she would take her medication.  Lialda restarted/refilled for one year at 2.4 grams daily.  Plan for repeat visit in one year. I removed allergy to mesalamine - has hx of nausea/vomiting with Asacol in past but has not had problems with Lialda like that.     GERD   Last Assessment & Plan Note   01/11/2011 Office Visit Signed 01/11/2011 11:42 AM by Iva Boop, MD    Doing well on bid Nexium 40 mg. She will continue - this helps her cough, especially.      Other   HYPERLIPIDEMIA   OBESITY   DEPRESSION/ANXIETY   SLEEP APNEA, OBSTRUCTIVE   UVEITIS   HEARING LOSS   HYPERTENSION   MYOCARDIAL INFARCTION, HX OF   Cor Athrscl-Uns Vessel   ATRIAL FIBRILLATION   ACUTE SINUSITIS,  UNSPECIFIED   ALLERGIC RHINITIS   ASTHMA   RENAL INSUFFICIENCY   LOW BACK PAIN, MILD   OSTEOPOROSIS   HEADACHE   CAROTID BRUIT   COUGH, CHRONIC   HYPERGLYCEMIA   ALANINE AMINOTRANSFERASE, SERUM, ELEVATED   MUSCLE STRAIN, ABDOMINAL WALL   COLONIC POLYPS, ADENOMATOUS, HX OF   Non compliance w medication regimen   Last Assessment & Plan Note   01/11/2011 Office Visit Addendum 01/11/2011 11:44 AM by Iva Boop, MD    She stopped Lialda due to transient diarrhea after long period of doing well. She was instructed to contact me if she had questions about her medications and possible side effects.

## 2011-01-11 NOTE — Assessment & Plan Note (Signed)
She is currently asymptomatic. Space Note that she was reportedly diagnosed with ulcerative colitis in her 30s. Space She had many years of intermittent problems. Space I met her around 2007. Space colonoscopy in 2008 suggested that she actually had Crohn's colitis.  Lialda  Was started and she was doing well.  She stopped this after transient diarrhea.  Denies that cost is an issue.  Noncompliance has been an issue off and on over time.    I explained the need for chronic therapy 2 reduce disease flares. Space She acknowledged this and said she would take her medication.  Lialda restarted/refilled for one year at 2.4 grams daily.  Plan for repeat visit in one year. I removed allergy to mesalamine - has hx of nausea/vomiting with Asacol in past but has not had problems with Lialda like that.

## 2011-01-11 NOTE — Assessment & Plan Note (Addendum)
She stopped Lialda due to transient diarrhea after long period of doing well. She was instructed to contact me if she had questions about her medications and possible side effects.

## 2011-01-11 NOTE — Patient Instructions (Signed)
Please contact the office for a follow up appointment in 1 year. We have given you a prescription to take to your pharmacy.

## 2011-01-11 NOTE — Assessment & Plan Note (Signed)
Doing well on bid Nexium 40 mg. She will continue - this helps her cough, especially.

## 2011-01-13 ENCOUNTER — Other Ambulatory Visit: Payer: Self-pay | Admitting: Cardiology

## 2011-01-13 DIAGNOSIS — I10 Essential (primary) hypertension: Secondary | ICD-10-CM

## 2011-01-13 NOTE — Telephone Encounter (Signed)
Pt is calling for DILTIAZEM [facility] into [phone]

## 2011-02-01 LAB — LIPID PANEL
HDL: 25 mg/dL — ABNORMAL LOW (ref >39)
LDL Cholesterol: 136 mg/dL — ABNORMAL HIGH (ref 0–99)
Total CHOL/HDL Ratio: 7.5 RATIO
Triglycerides: 135 mg/dL (ref <150)
VLDL: 27 mg/dL (ref 0–40)

## 2011-02-01 LAB — BASIC METABOLIC PANEL
BUN: 15 mg/dL (ref 6–23)
BUN: 15 mg/dL (ref 6–23)
CO2: 19 mEq/L (ref 19–32)
CO2: 19 mEq/L (ref 19–32)
Calcium: 8.8 mg/dL (ref 8.4–10.5)
Chloride: 112 mEq/L (ref 96–112)
Creatinine, Ser: 1.12 mg/dL (ref 0.4–1.2)
GFR calc non Af Amer: 45 mL/min — ABNORMAL LOW (ref >60)
Glucose, Bld: 106 mg/dL — ABNORMAL HIGH (ref 70–99)
Glucose, Bld: 108 mg/dL — ABNORMAL HIGH (ref 70–99)

## 2011-02-01 LAB — CBC
HCT: 43 % (ref 36.0–46.0)
Hemoglobin: 14.4 g/dL (ref 12.0–15.0)
Platelets: 313 10*3/uL (ref 150–400)
WBC: 10.6 10*3/uL — ABNORMAL HIGH (ref 4.0–10.5)

## 2011-02-01 LAB — TSH: TSH: 2.044 u[IU]/mL (ref 0.350–4.500)

## 2011-02-01 LAB — DIFFERENTIAL
Eosinophils Relative: 1 % (ref 0–5)
Lymphocytes Relative: 28 % (ref 12–46)
Lymphs Abs: 3 10*3/uL (ref 0.7–4.0)
Neutro Abs: 7 10*3/uL (ref 1.7–7.7)

## 2011-02-01 LAB — APTT: aPTT: 34 seconds (ref 24–37)

## 2011-02-01 LAB — PROTIME-INR
Prothrombin Time: 24.6 seconds — ABNORMAL HIGH (ref 11.6–15.2)
Prothrombin Time: 26.5 seconds — ABNORMAL HIGH (ref 11.6–15.2)

## 2011-02-01 LAB — POCT CARDIAC MARKERS: Myoglobin, poc: 74.6 ng/mL (ref 12–200)

## 2011-02-03 LAB — DIFFERENTIAL
Lymphocytes Relative: 23 % (ref 12–46)
Lymphs Abs: 1.5 10*3/uL (ref 0.7–4.0)
Monocytes Relative: 7 % (ref 3–12)
Neutro Abs: 4.4 10*3/uL (ref 1.7–7.7)
Neutrophils Relative %: 69 % (ref 43–77)

## 2011-02-03 LAB — TROPONIN I
Troponin I: 0.01 ng/mL (ref 0.00–0.06)
Troponin I: <0.01 ng/mL (ref 0.00–0.06)

## 2011-02-03 LAB — CK TOTAL AND CKMB (NOT AT ARMC)
CK, MB: 2.3 ng/mL (ref 0.3–4.0)
CK, MB: 2.4 ng/mL (ref 0.3–4.0)
CK, MB: 2.7 ng/mL (ref 0.3–4.0)
Relative Index: INVALID (ref 0.0–2.5)
Total CK: 18 U/L (ref 7–177)

## 2011-02-03 LAB — BASIC METABOLIC PANEL
BUN: 19 mg/dL (ref 6–23)
CO2: 28 mEq/L (ref 19–32)
Calcium: 8.6 mg/dL (ref 8.4–10.5)
Chloride: 100 mEq/L (ref 96–112)
Creatinine, Ser: 0.96 mg/dL (ref 0.4–1.2)
Creatinine, Ser: 1.16 mg/dL (ref 0.4–1.2)
GFR calc Af Amer: 57 mL/min — ABNORMAL LOW (ref >60)
GFR calc non Af Amer: 59 mL/min — ABNORMAL LOW (ref >60)
Glucose, Bld: 92 mg/dL (ref 70–99)
Potassium: 4.6 mEq/L (ref 3.5–5.1)
Sodium: 139 mEq/L (ref 135–145)

## 2011-02-03 LAB — HEPATIC FUNCTION PANEL
ALT: 49 U/L — ABNORMAL HIGH (ref 0–35)
Bilirubin, Direct: 0.1 mg/dL (ref 0.0–0.3)
Indirect Bilirubin: 0.9 mg/dL (ref 0.3–0.9)
Total Protein: 5.3 g/dL — ABNORMAL LOW (ref 6.0–8.3)

## 2011-02-03 LAB — URINALYSIS, ROUTINE W REFLEX MICROSCOPIC
Glucose, UA: NEGATIVE mg/dL
Hgb urine dipstick: NEGATIVE
Protein, ur: NEGATIVE mg/dL
pH: 7 (ref 5.0–8.0)

## 2011-02-03 LAB — PROTIME-INR: INR: 1.1 (ref 0.00–1.49)

## 2011-02-03 LAB — CBC
HCT: 39.8 % (ref 36.0–46.0)
Hemoglobin: 13.4 g/dL (ref 12.0–15.0)
MCV: 96.6 fL (ref 78.0–100.0)
Platelets: 201 10*3/uL (ref 150–400)
RDW: 16.4 % — ABNORMAL HIGH (ref 11.5–15.5)
WBC: 6.4 10*3/uL (ref 4.0–10.5)
WBC: 6.8 10*3/uL (ref 4.0–10.5)

## 2011-02-03 LAB — TSH: TSH: 3.12 u[IU]/mL (ref 0.350–4.500)

## 2011-02-03 LAB — BRAIN NATRIURETIC PEPTIDE: Pro B Natriuretic peptide (BNP): 511 pg/mL — ABNORMAL HIGH (ref 0.0–100.0)

## 2011-02-03 LAB — MAGNESIUM: Magnesium: 2.1 mg/dL (ref 1.5–2.5)

## 2011-02-13 ENCOUNTER — Other Ambulatory Visit: Payer: Self-pay | Admitting: Cardiology

## 2011-02-24 ENCOUNTER — Encounter: Payer: Self-pay | Admitting: Internal Medicine

## 2011-02-28 ENCOUNTER — Encounter: Payer: Self-pay | Admitting: Internal Medicine

## 2011-02-28 ENCOUNTER — Ambulatory Visit (INDEPENDENT_AMBULATORY_CARE_PROVIDER_SITE_OTHER): Payer: Medicare Other | Admitting: Internal Medicine

## 2011-02-28 DIAGNOSIS — R0989 Other specified symptoms and signs involving the circulatory and respiratory systems: Secondary | ICD-10-CM

## 2011-02-28 DIAGNOSIS — I10 Essential (primary) hypertension: Secondary | ICD-10-CM

## 2011-02-28 DIAGNOSIS — Z789 Other specified health status: Secondary | ICD-10-CM | POA: Insufficient documentation

## 2011-02-28 DIAGNOSIS — R7309 Other abnormal glucose: Secondary | ICD-10-CM

## 2011-02-28 DIAGNOSIS — N259 Disorder resulting from impaired renal tubular function, unspecified: Secondary | ICD-10-CM

## 2011-02-28 LAB — BASIC METABOLIC PANEL WITH GFR
BUN: 20 mg/dL (ref 6–23)
CO2: 25 mEq/L (ref 19–32)
Calcium: 9.2 mg/dL (ref 8.4–10.5)
Chloride: 105 mEq/L (ref 96–112)
Creat: 1.09 mg/dL (ref 0.40–1.20)

## 2011-02-28 MED ORDER — HYDROCHLOROTHIAZIDE 12.5 MG PO TABS: ORAL_TABLET | ORAL | Status: DC

## 2011-02-28 MED ORDER — ESCITALOPRAM OXALATE 10 MG PO TABS: 10.0000 mg | ORAL_TABLET | Freq: Every day | ORAL | Status: DC

## 2011-02-28 MED ORDER — METOPROLOL TARTRATE 25 MG PO TABS: 25.0000 mg | ORAL_TABLET | Freq: Two times a day (BID) | ORAL | Status: DC

## 2011-02-28 MED ORDER — LOSARTAN POTASSIUM 25 MG PO TABS: 25.0000 mg | ORAL_TABLET | Freq: Every day | ORAL | Status: DC

## 2011-02-28 NOTE — Assessment & Plan Note (Signed)
I still hear right carotid bruit.   However, carotid doppler completed 11/2009 was negative for carotid artery stenosis. Consider repeat doppler

## 2011-02-28 NOTE — Progress Notes (Signed)
Subjective:    Patient ID: [name], female    DOB: Oct 07, 1946, 65 y.o.   MRN: [id number]  HPI    Review of Systems     Past Medical History  Diagnosis Date  . CAD (coronary artery disease)   . Depression   . GERD (gastroesophageal reflux disease)   . Hyperlipidemia   . Hypertension   . Crohn's colitis     she reports ulcerative colitis beginning in her 1's, biopsies 2008 suggest Crohn's not UC  . Pseudoaneurysm of right femoral artery   . Pancreatitis   . Chronic sinusitis   . Renal oncocytoma   . Myocardial infarction   . Uveitis   . Anxiety   . Obesity   . Chronic cough     History   Social History  . Marital Status: Married    Spouse Name: N/A    Number of Children: 3  . Years of Education: N/A   Occupational History  .      retired   Social History Main Topics  . Smoking status: Never Smoker   . Smokeless tobacco: Never Used  . Alcohol Use: No  . Drug Use: No  . Sexually Active: Not Currently   Other Topics Concern  . 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 Nephrectomy 10/2006    right secondary to oncocytoma   . Breast biopsy 1996    benign lesion   . Cholecystectomy 10/2006  . Tubal ligation   . Right renal artery repair 1976  . Right femoral  pseudoaneurysm & right groin hematoma evacuation 02/2007    Family History  Problem Relation Age of Onset  . Colitis Father   . Hypertension Mother   . Coronary artery disease Mother   . Stroke Mother   . Stroke Brother   . Colon cancer Paternal Aunt   . Clotting disorder Mother   . Clotting disorder Brother   . Clotting disorder Sister   . Heart disease Mother   . Heart disease Father   . Asthma Daughter     Allergies  Allergen Reactions  . Codeine   . Latex     Current Outpatient Prescriptions on File Prior  to Visit  Medication Sig Dispense Refill  . aspirin 81 MG EC tablet Take 81 mg by mouth daily. 1 per day       . chlorpheniramine (CHLOR-TRIMETON) 4 MG tablet Take 4 mg by mouth 2 (two) times daily as needed. 1 daily as needed        . diltiazem (CARDIZEM CD) 120 MG 24 hr capsule TAKE ONE CAPSULE BY MOUTH TWICE A DAY  60 capsule  0  . escitalopram (LEXAPRO) 10 MG tablet Take 10 mg by mouth daily. 1 daily       . esomeprazole (NEXIUM) 40 MG capsule Take 40 mg by mouth daily before breakfast. 1 two times daily        . Glucosamine-Chondroitin (GLUCOSAMINE CHONDR COMPLEX PO) Take by mouth. 1 daily        . hydrochlorothiazide (,MICROZIDE/HYDRODIURIL,) 12.5 MG capsule Take 12.5 mg by mouth daily. 1/2 tab daily       . loperamide (IMODIUM A-D) 2 MG tablet Take 2 mg by mouth 4 (four) times daily as needed. Prn       . losartan (  COZAAR) 25 MG tablet Take 25 mg by mouth daily. 1 by mouth 2 times daily        . mesalamine (LIALDA) 1.2 G EC tablet Take 1 tablet (1.2 g total) by mouth 2 (two) times daily.  60 tablet  30  . metoprolol tartrate (LOPRESSOR) 25 MG tablet Take 25 mg by mouth 2 (two) times daily.       . Multiple Vitamin (MULTIVITAMIN) capsule Take 1 capsule by mouth daily. 1 daily        . nitroGLYCERIN (NITROSTAT) 0.4 MG SL tablet Place 0.4 mg under the tongue every 5 (five) minutes as needed. 1 tab under tongue every 5 min for chest pain not to exceed 3 tabs in 15 min       . Probiotic Product (PHILLIPS COLON HEALTH) CAPS Take by mouth. 1 per day       . diltiazem (TIAZAC) 120 MG 24 hr capsule Take 120 mg by mouth daily. 1 by mouth 2 times daily         BP 100/66  Pulse 53  Temp(Src) 98.3 F (36.8 C) (Oral)  Resp 20  Ht 5\' 4"  (1.626 m)  Wt 228 lb (103.42 kg)  BMI 39.14 kg/m2  SpO2 95%    Objective:   Physical Exam  Constitutional: She appears well-developed and well-nourished.  Neck:       Right carotid bruit  Cardiovascular: Normal rate and normal heart sounds.  Exam  reveals no gallop.   Pulmonary/Chest: Effort normal and breath sounds normal. She has no wheezes. She has no rales.  Skin: Skin is warm and dry.  Psychiatric: She has a normal mood and affect. Her behavior is normal.          Assessment & Plan:

## 2011-02-28 NOTE — Patient Instructions (Signed)
Try to taper off lexapro as directed Follow 1200-1300 cal diet You can use www.my-calorie-counter.com to help track calorie intake

## 2011-03-08 NOTE — Assessment & Plan Note (Signed)
[facility]                         GASTROENTEROLOGY OFFICE NOTE   NAME:[name], [name]                     MRN:          [id number]  DATE:08/01/2007                            DOB:          Aug 17, 1946    PROBLEM:  See previous problem list.   ACTIVE PROBLEMS:  Ulcerative colitis.  Recent colonoscopy showed patchy  mucosal changes of erythema and hypervascularity in the sigmoid,  diverticulosis and a 2-mm tubular adenoma as well as small internal  hemorrhoids.  The ileum looked normal.  An EGD showed a small hiatal  hernia, otherwise negative.  She had colon biopsies which demonstrated  focal active colitis and mild chronic changes.   The pathologist thought that the features suggested possible Crohn's  colitis.  Over the years, she has had some type of colitis problem.  I  have given her a diagnosis of ulcerative colitis, that seems reasonable  thought his could be indeterminate.  At this time she is not having much  diarrhea.  She is taking Librarian, academic.  Her other medications are listed.  She  occasionally uses a Lomotil or an Imodium.  She has had wild swings of  severe exacerbations and problems throughout the years.  I explained to  her the nature of inflammatory bowel disease and the chronicity of it,  and the need for chronic medication.  She says she has had vomiting  problems when she was on Asacol in the past, and I think she has been on  sulfasalazine and a whole host of other medications such as that.   PHYSICAL EXAMINATION:  Weight 176 pounds, pulse 52, blood pressure  118/68.   ASSESSMENT:  1. Panulcerative colitis (could be Crohn's colitis, very mild).  This      is currently relatively asymptomatic.  2. Adenomatous colon polyps, diminutive.  3. Diverticulosis, hemorrhoids.  4. Small hiatal hernia.   At the current time she is doing well.   PLAN:  Initiate Lialda 1.2 g b.i.d.  Possible side effects discussed.  She is to call if she  feels any different on this medication.  She  should continue Align as much as possible as I think that has been  somewhat beneficial for her.  She should return to see me in 6 to 12  months.  At some point, if she has not had bone densitometry study, we  should consider that.  Dr. Artist Pais can do so.  We will address that with her  when she returns.    Iva Boop, MD,FACG  Electronically Signed   CEG/MedQ  DD: 08/01/2007  DT: 08/01/2007  Job #: [id number]   cc:   Barbette Hair. Artist Pais, DO

## 2011-03-08 NOTE — Op Note (Signed)
NAMETAKITA, RIECKE              ACCOUNT NO.:  [id number]   MEDICAL RECORD NO.:  [id number]          PATIENT TYPE:  INP   LOCATION:  2605                         FACILITY:  [facility]   PHYSICIAN:  Bevelyn Buckles. Bensimhon, MDDATE OF BIRTH:  Mar 22, 1946   DATE OF PROCEDURE:  02/27/2009  DATE OF DISCHARGE:                               OPERATIVE REPORT   CARDIOVERSION   PATIENT IDENTIFICATION:  [name] is 65 year old woman with a  history of coronary artery disease and hypertension.  She developed  atrial fibrillation with rapid ventricular response which was fairly  refractory to rate control.  She was scheduled for a TEE-guided  cardioversion.  INR on the morning of the procedure was 2.1.   She underwent TEE which showed an EF of 60% with left ventricular  hypertrophy.  The left atrium was markedly dilated with a significant  amount of spontaneous echo contrast.  However, the left atrial appendage  was free of any thrombus.  It was quite small.  Once this was confirmed  she then underwent electrical cardioversion with the assistance of  anesthesia for sedation.  She received a single synchronized 200 joule  biphasic shock with prompt conversion to sinus rhythm.  There were no  apparent complications.   Of note, I suspect Ms. Townsel will be at high risk for recurrent  atrial fibrillation given the size of her left atrium.  If this does  recur she may be a candidate for antiarrhythmic therapy.      Bevelyn Buckles. Bensimhon, MD  Electronically Signed     DRB/MEDQ  D:  02/27/2009  T:  02/27/2009  Job:  [id number]

## 2011-03-08 NOTE — Cardiovascular Report (Signed)
NAMEBEAUTIFUL, PENSYL              ACCOUNT NO.:  [id number]   MEDICAL RECORD NO.:  [id number]          PATIENT TYPE:  INP   LOCATION:  2807                         FACILITY:  [facility]   PHYSICIAN:  Veverly Fells. Excell Seltzer, MD  DATE OF BIRTH:  1946-10-07   DATE OF PROCEDURE:  03/06/2007  DATE OF DISCHARGE:                            CARDIAC CATHETERIZATION   PROCEDURE:  Left heart catheterization, selective coronary angiography  and left ventricular angiography.   INDICATIONS:  [name] is a 65 year old woman who presented with  substernal chest pain radiating to her arms.  She had a recent lateral  wall myocardial infarction, presumably secondary to an occluded diagonal  branch.  She had done well after her MI, until yesterday when she  developed severe chest pain.  Her enzymes have been negative, but she  had dynamic EKG changes with a normal EKG when she arrived, and this  morning's EKG demonstrated marked T-wave inversions across all the  precordial leads.  She was subsequently referred for cardiac  catheterization.   Risks and indications of procedure were explained to the patient in  detail.  Informed consent was obtained.  The right groin was prepped,  draped and anesthetized with 1% lidocaine.  Using the modified Seldinger  technique, a 6-French sheath was placed in the right femoral artery,  without difficulty.  Selective coronary angiography was performed using  standard pre-formed Judkins' catheters.  Following selective coronary  angiography, an angled pigtail catheter was inserted into the left  ventricle, and a 30-degree RAO left ventriculogram was performed.  All  catheter exchanges were performed over a guidewire.  There were no  immediate complications.   FINDINGS:  Aortic pressure is 183/83 with a mean of 123.  Left  ventricular pressure is 184/7, with an end-diastolic pressure of 17.   The left mainstem is angiographically normal.  It bifurcates into the  LAD and  left circumflex.   The LAD is a large-caliber vessel that courses down and wraps around the  LV apex.  It has minor luminal irregularities throughout its proximal  portion.  There is an outpouching in the mid-LAD, where I suspect there  was a diagonal branch present before, that is stable from her last  catheterization.  There are no hypodensities or areas suspicious for  plaque rupture in the LAD.  There is TIMI-III flow with non-obstructive  plaque present.   The left circumflex is a large-caliber vessel that courses down and  provides two obtuse marginal branches and two posterolateral branches.  There is non-obstructive plaque in the proximal portion of circumflex,  no worse than 20% stenosis.  Otherwise, there is no significant  angiographic disease present.  The right coronary artery is dominant.  It gives off a large PDA branch, as well as posterior AV segment that  supplies a right posterolateral branch.  There is no significant  angiographic disease seen throughout the right coronary artery system.   Left ventriculography performed in the 30-degree RAO projection  demonstrates a normal left ventricular systolic function with an  estimated LVEF of 55%.   ASSESSMENT:  1. Non-obstructive plaque  in the LAD.  2. Non-obstructive plaque in the left circumflex.  3. Normal right coronary artery.  4. Normal left ventricular function.   PLAN:  I suspect [name]'s chest pain syndrome yesterday  represented a noncardiac problem.  However, I cannot explain the EKG  changes.  Her ventricular function has actually recovered nicely, since  her lateral wall MI.  I would recommend continuation of her current  medical therapy without any changes.      Veverly Fells. Excell Seltzer, MD     MDC/MEDQ  D:  03/06/2007  T:  03/06/2007  Job:  [id number]

## 2011-03-08 NOTE — Assessment & Plan Note (Signed)
[facility]                            CARDIOLOGY OFFICE NOTE   NAME:[name], [name]                     MRN:          [id number]  DATE:06/21/2007                            DOB:          21-Feb-1946    Sahra Converse returns for followup at the [facility] cardiology office on  June 21, 2007.  Ms. Tiggs is a delightful 65 year old woman with  coronary artery disease.  She presented with a myocardial infarction  that was thought to be due to an occluded diagonal branch to the LAD.  She has been treated medically and has been doing well.  Her left  ventricular function has returned to normal, and her most recent  catheterization from May 13 demonstrated stable coronary anatomy with  nonobstructive disease in her LAD, left circumflex, and right coronary  artery.  She had a normal left ventricular function with an EF of 55%.  Unfortunately, she developed a large thigh hematoma and pseudoaneurysm  ultimately requiring surgical repair.  It has been a long recovery for  Ms. Doolin who had a prolonged hospitalization and course of wound  care.  She has improved greatly since I have seen her last, and her  wound has healed well.  She is walking without much limitation and is  well on the way to recovery.  She denies any chest pain, dyspnea,  orthopnea, PND, palpitations, or light-headedness, or syncope.  She has  no cardiac complaints at present.   CURRENT MEDICATIONS:  1. Lexapro 10 mg daily.  2. Nexium 40 mg daily.  3. Aspirin 81 mg daily.  4. Norvasc 10 mg daily.  5. Colace daily.  6. Cozaar 100 mg daily.  7. Metoprolol 75 mg twice daily.  8. Multivitamin daily.  9. Glucosamine daily.   EXAM:  She is alert and oriented, in no acute distress.  Weight is 181 pounds, blood pressure is 142/86, heart rate is 53,  respiratory rate is 16.  HEENT:  Normal.  NECK:  Normal carotid upstrokes without bruits.  Jugular venous pressure  is normal.  LUNGS:   Clear to auscultation bilaterally.  HEART:  Bradycardic and regular without murmurs or gallops.  ABDOMEN:  Soft, obese, and nontender.  No organomegaly.  EXTREMITIES:  There is no cyanosis or clubbing.  There is 1+ diffuse  edema of the right lower extremity.  The right groin wound is well  healed.  Peripheral pulses are 2+ and equal.   ELECTROCARDIOGRAM:  Shows sinus bradycardia and is otherwise within  normal limits.   ASSESSMENT:  Ms. Shippee is stable from a cardiac standpoint.  Cardiac  problems as follows.  1. Coronary artery disease.  Continue current medical regimen.      Encouraged increased activity now that her leg has healed.  She      would benefit from a daily walking program, and we have reviewed      this in detail.  With an absence of angina, there is no indication      for any ischemic evaluation at this time.  2. Right groin pseudoaneurysm and hematoma requiring surgical repair.  She has recently been seen by Dr. Darrick Penna and he has released her,      as her wound has completely healed.  3. Hypertension.  Continue current therapy with metoprolol, Norvasc,      and Cozaar.  4. Lipids.  She has been on Lipitor, but this was discontinued due to      myalgias.  I am going to recheck her lipids and LFTs, and likely      will restart Statin therapy.  I would favor, since she did not      tolerate Lipitor 40 mg, a low dose of an alternative agent, such as      Crestor 10 mg.  I will review her lipid panel before starting any      therapy.   FOLLOWUP:  I would like to see Ms. Valentine back in 6 months or sooner  if any new problems arise.     Veverly Fells. Excell Seltzer, MD  Electronically Signed    MDC/MedQ  DD: 06/21/2007  DT: 06/22/2007  Job #: [id number]   cc:   Barbette Hair. Artist Pais, DO

## 2011-03-08 NOTE — Assessment & Plan Note (Signed)
OFFICE VISIT   [name], [name]  DOB:  [birth year]                                       04/20/2007  [id number]#:[id number]   [name] returns for further follow up today and checking of her  right groin wound after repair of a pseudoaneurysm.  The wound is  approximately 50% smaller.  The two areas that were edematous in the  distal thigh are still slightly open, but are not draining any purulent  material.  All the erythema has completely resolved.  All the wounds  overall are improved.  She will follow up with me in two weeks time to  recheck the wound.  Today we have changed this to a once a day Hydrogel  dressing.   [name], MD  Electronically Signed   CEF/MEDQ  D:  04/22/2007  T:  04/23/2007  Job:  [id number]

## 2011-03-08 NOTE — Assessment & Plan Note (Signed)
OFFICE VISIT   [name], [name]  DOB:  01/20/1946                                       05/04/2007  [id number]#:[id number]   The patient returns for followup for a wound check of her right groin.  The wound continues to contract down and is epithelializing.  It is now  approximately 3x3 cm.  She will continue her hydrogel dressings.  She  has some trace edema in the right leg but this is not overall  remarkable.  She will follow up in 1 month's time.   [name], MD  Electronically Signed   CEF/MEDQ  D:  05/04/2007  T:  05/07/2007  Job:  [id number]

## 2011-03-08 NOTE — Assessment & Plan Note (Signed)
[name]                         GASTROENTEROLOGY OFFICE NOTE   ALAISA, [name]                       MRN:          [id number]  DATE:02/27/2007                            DOB:          1946-01-27    CHIEF COMPLAINT:  Followup of ulcerative colitis.   See my consultation of February 07, 2007 for this lady's recent past  medical history as well as problem list and remote past medical history.   She has a history of ulcerative colitis diagnosed years ago. Her last  endoscopic evaluation was by Dr. Karena [name] on August 11, 2006. A  flexible sigmoidoscopy showed changes of colitis and the biopsy showed  severe chronic active colitis. She mainly had rectosigmoid erythema. The  patient has declined a colonoscopy for a number of years but is ready to  have one now. She originally was to see Dr. Lina [name] upon personal  recommendations from a friend, but since I saw her in consultation in  the hospital she is now going to follow with me. I have reviewed the  records from [facility] as well. She was started on Asacol after I  reviewed the records but she thought that caused more diarrhea. The  primary care physician had to put her on Avelox for an upper respiratory  infection and suggested Align and she thinks that is actually helping  her diarrhea. She still has some intermittent nausea and vomiting since  January as well. That is since her kidney surgery though it apparently  started before that because she kept being told she had a UTI prior to  that surgery for an infected left kidney and was associating that with  her nausea and vomiting. At any rate, she has persistent episodic nausea  and vomiting. The rash she described in the hospital is better. She did  not use the hydrocortisone cream as prescribed. She thinks her legs and  hands are a little puffy and swollen since a trip to [facility] over  the weekend.   CURRENT MEDICATIONS:  1.  Metoprolol 25 mg b.i.d.  2. Lexapro 10 mg daily.  3. Nexium 40 mg daily.  4. Plavix 75 mg daily.  5. Align daily.  6. Aspirin 81 mg daily.  7. Nitroglycerin p.r.n.   MEDICATION ALLERGIES:  CODEINE, LATEX and MUCINEX.   PROBLEMS AND PAST MEDICAL HISTORY:  1. Previous acute pancreatitis, etiology not clear.  2. Asthma.  3. Atrial fibrillation in the past.  4. Hypertension.  5. Renal oncocytoma with right radical nephrectomy at [facility] November 02, 2006.  6. Hearing loss.  7. Ulcerative colitis.  8. Nausea and vomiting.  9. Gastroesophageal reflux.  10.Depression.  11.Benign breast biopsy.  12.Cholecystectomy.  13.Prior tubal ligation.  14.Recent admission for chest pain, ruled out MI quickly. On Plavix,      but she did have a myocardial infarction April 2007. She has been      anemic as well. She is on Plavix but does not have a stent.  15.Negative celiac sprue serology a number of years ago in [facility]  and drawn on recent admission in April. This was explained to the      patient that she does not have sprue as she had questioned.  16.Obstructive sleep apnea.  17.Obesity.   PHYSICAL EXAMINATION:  Limited to vitals today. Weight 186 pounds, pulse  67, blood pressure 130/70. I see no pedal edema or perhaps trace pedal  edema.   ASSESSMENT:  Long history of ulcerative colitis, has never had a  colonoscopy. She needs that for surveillance to rule out dysplasia or  cancer. We need to ask Dr. Excell [name] if it is okay she holds her Plavix for  5-7 days prior to the procedure but can continue her aspirin.   She also has chronic intermittent nausea and vomiting, so since she has  to come off Plavix I think it would be appropriate to perform an EGD to  rule out any structural problem causing those symptoms as well. The  risks, benefits and indications are explained to the patient, she  understands and agrees to proceed. Pending these studies, we will try to  sort out an  appropriate therapy for this lady. She has been on multiple  ASA compounds and come off of them for a variety of reasons it sounds  like, she tells me she was told to avoid prednisone by the surgeon that  removed her kidney, I am not sure why that is the case though obviously  we would like to avoid that, may need an immunomodulator. She does not  seem that symptomatic from her ulcerative colitis at this time.     [name] Boop, MD,FACG  Electronically Signed    CEG/MedQ  DD: 02/27/2007  DT: 02/28/2007  Job #: [id number]   cc:   [name] Fells. Excell Seltzer, MD  [name] Hair. Artist Pais, DO

## 2011-03-08 NOTE — Assessment & Plan Note (Signed)
[facility]                           PRIMARY CARE OFFICE NOTE   [name], [name] CADENCE MINTON                     MRN:          [id number]  DATE:03/01/2007                            DOB:          01-Feb-1946    CHIEF COMPLAINT:  New patient to practice, referred by Marcelino Duster from  cardiology.   HISTORY OF PRESENT ILLNESS:  Patient is a complex 65 year old white  female here to establish primary care.  She was previously followed by  Dr. Marlou Starks in [place], [place].  She was recently  hospitalized at [facility] in April 2007 and was  noted to have an acute myocardial infarction that involved diagonal  occlusion.  PTCA was attempted but unsuccessful.  Patient was treated  medically.   Her hospital course was complicated by GI issues.  She has a history of  ulcerative colitis, was seen by Dr. Leone Payor.  She had issues of nausea  and vomiting and also has chronic diarrhea.  Patient states that from a  GI standpoint, she has improved significantly since starting new  medication called Align.   Shortly after her last hospitalization her blood pressure was relatively  low and because of GI issues, Dr. Excell Seltzer elected to hold Benicar and  Lipitor at that time.  She is monitoring her blood pressure at home and  slowly her blood pressure has gotten worse with current blood pressures  systolic 158/70.  Complicating matters further is that in January 2008,  she had a right nephrectomy for renal oncocytoma as per gastroenterology  notes, this was performed at [facility].  She also is noted to  have been admitted to the hospital in January 2008, for acute  pancreatitis.  She was told it may have been secondary to gallstones.  This apparently had resolved.   PAST MEDICAL HISTORY:  1. Coronary artery disease with acute myocardial infarction January 29, 2007.  2. Chronic ulcerative colitis.  3. Nausea and vomiting,  resolved.  4. Hypertension.  5. Hyperlipidemia.  6. Depression/anxiety.  7. Status post cholecystectomy January 2008.  8. 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  9. Status post breast biopsy in 1996.  10.Paroxysmal atrial fibrillation.  11.Gastroesophageal reflux disease.  12.Prior tubal ligation.  13.Obesity.   CURRENT MEDICATIONS:  1. Metoprolol 25 mg twice a day.  2. Lexapro 10 mg once a day.  3. Nexium 40 mg once a day.  4. Plavix 75 mg once a day.  5. Align once a day.  6. Aspirin 81 mg once a day.   ALLERGIES:  CODEINE, LATEX and MUCINEX.   SOCIAL HISTORY:  Patient is married, has three grown children.  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   HABITS:  No alcohol, no tobacco.   FAMILY HISTORY:  Father deceased, known to have some form of colitis.  Mother is alive at age 30, lives in a nursing home, has a history of  hypertension, coronary artery disease  and stroke.  Patient also has a  brother with history of stroke and an aunt with colon cancer.   REVIEW OF SYSTEMS:  No fevers, chills, no HEENT symptoms.  Patient  denies any chest pain or shortness of breath.  Less issues with chronic  diarrhea.  All other systems negative.   PHYSICAL EXAMINATION:  VITAL SIGNS:  Weight is 180 pounds, temperature  97, pulse 55, blood pressure 158/78 in the left arm in the seated  position.  GENERAL APPEARANCE:  The patient is a pleasant, overweight 66 year old  white female in no apparent distress.  HEENT:  Normocephalic and atraumatic.  Pupils are equal, round and  reactive to light bilaterally.  Extraocular motility was intact.  Patient was anicteric.  Conjunctivae were within normal limits.  External auditory canal and tympanic membranes were clear.  She wears  bilateral hearing aids.  Oropharyngeal exam was unremarkable.  NECK:  Supple without any evidence of carotid bruit, thyromegaly, or   thyroid nodules.  CHEST:  Normal respiratory effort.  Chest was clear to auscultation  bilaterally.  No wheezing, rhonchi or rales.  CARDIOVASCULAR:  Regular rate and rhythm with no significant murmurs,  rubs, or gallops appreciated.  ABDOMEN:  Soft, nontender.  Unable to appreciate organomegaly.  MUSCULOSKELETAL:  No clubbing, cyanosis, or edema.  Patient had palpable  dorsalis pedis pulses.  SKIN:  Warm and dry.  NEUROLOGIC:  Cranial nerves II-XII grossly intact.  She was nonfocal.   IMPRESSION/RECOMMENDATIONS:  1. Hypertension, uncontrolled.  2. Coronary artery disease.  3. Hyperlipidemia.  4. History of ulcerative colitis.  5. Status post right nephrectomy with a renal oncocytoma.  6. History of pancreatitis.  7. History of paroxysmal atrial fibrillation.  8. Health maintenance.   RECOMMENDATIONS:  1. Patient's GI status has improved.  2. Her blood pressure is uncontrolled.  She will be started on new      ARB, Losartan 25 mg once a day.  We will titrate over the next 2-3      months with goal blood pressures in the 120s considering her      chronic renal insufficiency.  3. We discussed at length the need to avoid nephrotoxic agents.  She      is to avoid any over-the-counter NSAIDs for aches and pains.  She      is to take Tylenol only.  4. Will arrange follow-up labs in approximately four weeks and when      she follows up with Dr. Excell Seltzer of [facility], I will defer      decision about restarting Statins at that time.  5. She is to continue to follow with Dr. Leone Payor.  He would like to      perform a colonoscopy, however, Plavix will need to be      discontinued.  We are awaiting input from her cardiologist.  6. I advised the patient to forward records from her previous      hospitalization in [place], [place], with episode of      pancreatitis before our follow-up visit.  We will also arrange      follow-up BMET in approximately 2-4 weeks.      Barbette Hair. Artist Pais, DO     RDY/MedQ  DD: 03/01/2007  DT: 03/01/2007  Job #: [id number]   cc:   Veverly Fells. Excell Seltzer, MD  Iva Boop, MD,FACG

## 2011-03-08 NOTE — Assessment & Plan Note (Signed)
[facility]                            CARDIOLOGY OFFICE NOTE   NAME:[name], [name]                     MRN:          [id number]  DATE:04/05/2007                            DOB:          1946-07-11    [name] was seen in followup at the [facility] Cardiology office on  April 05, 2007.  She is a 65 year old woman with coronary artery disease,  who underwent diagnostic cardiac catheterization on May 13, which showed  nonobstructive disease involving the LAD, but no acute findings.  Unfortunately, after her catheterization, she developed a large groin  hematoma.  She was watched conservatively, but continued to bleed over  several days and developed a pseudoaneurysm.  Dr. Darrick Penna performed vein  patch angioplasty of her right superficial femoral artery and hematoma  evacuation of the right groin on May 20.  She required several blood  transfusions and had a prolonged hospitalization.  She was seen by Dr.  Darrick Penna back in followup on June 6 and was doing well at that time.  Since she has seen him, her wound has gotten worse.  She has developed  more drainage and pain in the area.  She saw Dr. Artist Pais yesterday, who  started her on Augmentin and Cipro and has recommended her to be seen at  the wound care clinic.  She is doing three-times-daily dressing changes.  From a cardiac standpoint, she is having no problems and denies any  chest pain, dyspnea or other complaints.   CURRENT MEDICATIONS INCLUDE:  1. Lexapro 10 mg daily.  2. Nexium 40 mg daily.  3. Aspirin 81 mg daily.  4. Norvasc 10 mg daily.  5. Colace daily.  6. Cozaar 100 mg daily.  7. Metoprolol 75 mg twice daily.   ALLERGIES:  CODEINE, LATEX and MUCINEX.   PHYSICAL EXAMINATION:  Patient is alert and oriented.  She is in no  acute distress.  Weight is 182 pounds, blood pressure is 100/64, heart  rate 64, respiratory rate 16.  HEENT:  Normal.  NECK:  Normal carotid upstrokes without bruits.   Jugular venous pressure  is normal.  LUNGS:  Clear bilaterally.  HEART:  Regular rate and rhythm without murmurs or gallops.  ABDOMEN:  Soft, nontender.  There was an area of ecchymosis with large  hematoma around the right flank, which has resolved and softened up.  The pedal pulses are 2+ and equal.  There is trace edema on the right  lower leg and no edema on the left lower leg.  The right groin wound was  examined and there was a good deal of serosanguineous drainage.  The  inferior aspect of the incision in the mid-thigh is indurated and  tender.   EKG:  Shows normal sinus rhythm and is within normal limits.   ASSESSMENT:  [name] has the following cardiovascular issues:   1. Status post vascular surgery with vein patch repair and pseudo-      aneurysm repair with concern for wound infection.  I spoke with Dr.      Darrick Penna and he kindly agreed to see [name]  in clinic tomorrow.      I think it would be reasonable to continue her antibiotics for now      and have him assess her groin to see if she needs any debridement      or other measures.  She may benefit from going to the wound care      clinic, as well.  She currently has good help at home with a      friend, who is an ex surgical nurse, helping her on a daily basis,      as well as home health care coming out three times a week, but      regular visits to the wound care clinic may also be helpful.  She      does not have any signs of systemic illness at present and      hopefully she will continue to be able to be managed as an      outpatient.  2. Coronary artery disease.  She had stable disease and is having no      symptoms.  She should continue on her current medical therapy.  3. Hypertension.  Would continue her current medical regimen.   For followup, I would like to see Ms. Rickels back closely within the  next two weeks.     Veverly Fells. Excell Seltzer, MD  Electronically Signed    MDC/MedQ  DD:  04/05/2007  DT: 04/06/2007  Job #: [id number]   cc:   Barbette Hair. Artist Pais, DO  Charles E. Darrick Penna, MD

## 2011-03-08 NOTE — Consult Note (Signed)
[name], [name]              ACCOUNT NO.:  [id number]   MEDICAL RECORD NO.:  [id number]          PATIENT TYPE:  INP   LOCATION:  3733                         FACILITY:  [facility]   PHYSICIAN:  Rollene Rotunda, MD, FACCDATE OF BIRTH:  [birth year]   DATE OF CONSULTATION:  01/15/2009  DATE OF DISCHARGE:                                 CONSULTATION   PRIMARY CARDIOLOGIST:  Madolyn Frieze. Jens Som, MD, [facility]   PRIMARY CARE Cleda Imel:  Barbette Hair. Artist Pais, DO   GI DOCTOR:  Iva Boop, MD, [facility]   PATIENT PROFILE:  A 65 year old Caucasian female with prior history of  CAD status post MI related to diagonal occlusion as well as paroxysmal  atrial fibrillation who presents with recurrent AFib.   PROBLEMS:  1. Paroxysmal atrial fibrillation.      a.     2-D echocardiogram in May 2008 revealing an EF of 65%.  2. Coronary artery disease.      a.     Status post MI in April 2007 revealing occlusion of the       first diagonal.  Balloon angioplasty was attempted; however, the       team was unable to cross with the wire.      b.     Mar 06, 2007, cardiac catheterization revealing       nonobstructive coronary artery disease with an occluded diagonal.  3. History of right coronary pseudoaneurysm status post surgical      repair following last catheterization.  4. Hypertension.  5. Hyperlipidemia.  6. Ulcerative colitis status post flare in January 2010 currently      being treated with prednisone and Lialda.  7. Status post nephrectomy.  8. Status post cholecystectomy.   HISTORY OF PRESENT ILLNESS:  A 65 year old Caucasian female with the  above problem list.  She recently has been feeling estranged over the  past few weeks with occasional tachy palpitations.  She has also noticed  more dyspnea on exertion than usual.  At this point, she went to see Dr.  Artist Pais and noted a mild discomfort in the center of her chest that was  nonradiating and it resolved spontaneously.  She on Dr. Olegario Messier examining  noted increased heart rate.  An ECG was performed showing AFib with RVR  without acute ST-T changes.  Of note, she has had increasing lower  extremity edema, which she attributes to prednisone therapy stating that  she always gets edema when she requires prednisone.   ALLERGIES:  CODEINE causes tachycardia, LATEX causes breathing problems.  LIPITOR.   HOME MEDICATIONS:  1. Nexium 40 mg daily.  2. Align caps 1 daily.  3. Aspirin 81 mg daily.  4. AZOR 10/20 daily.  5. Lexapro 10 mg daily.  6. Prednisone 15 mg daily.  7. Multivitamin daily.  8. Lialda 1-2 mg 2 tablets in the a.m.  9. Metoprolol XL 50 mg b.i.d.   FAMILY HISTORY:  Mother is alive at 60, CVA, and hypertension.  Father  died at 34 from cardiac complications of hyponatremia, has a brother who  has a history of CVA and coronary  artery disease, has 2 sisters with  CVA.   SOCIAL HISTORY:  She lives in [place] with her husband.  She is to be  a Lawyer in [place].  She has 3 grandchildren.  She  denies tobacco, alcohol, or drug use, not routinely exercising.   REVIEW OF SYSTEMS:  Positive for intermittent diaphoresis, recent  history of uveitis.  She has had ulcerative colitis flare with  associated nausea, vomiting, diarrhea, and abdominal discomfort.  She  has had significant lower extremity edema while on prednisone.  She does  have chest pain as outlined in the HPI.  She had increasing dyspnea on  exertion above usual.  Otherwise, all systems reviewed negative.  She is  a full code.   PHYSICAL EXAMINATION:  VITAL SIGNS:  Temperature 97.7, heart rate 145,  respirations 16, blood pressure 113/72, pulse ox 95% on room air, weight  is 98.8 kg.  GENERAL:  Pleasant white female in no acute distress.  Awake, alert, and  oriented x3.  HEENT:  Normal.  Nares grossly intact.  Nonfocal.  SKIN:  Warm and dry without lesions or masses.  MUSCULOSKELETAL:  Grossly normal without deformity or effusions.   PSYCHIATRY:  Normal affect.  NECK:  No bruits, JVD.  LUNGS:  Respirations are regular and unlabored.  Clear to auscultation.  CARDIAC:  Irregularly irregular S1 and S2.  No S3, S4, or murmur.  She  is tachycardic.  ABDOMEN:  Obese, soft, nontender, and nondistended.  Bowel sounds  present.  EXTREMITIES:  Lower extremities warm, dry, and pink with 2+ bilateral  lower extremity edema.  Dorsalis pedis and posterior tibial pulses 2+  bilaterally.   Chest x-ray is pending.  EKG shows AFib with rapid response.  No acute  ST-T changes.  Hemoglobin 13.1, hematocrit 38.9, WBC 6.8, platelets 201.  Sodium 138, potassium 3.7, chloride 103, CO2 of 26, BUN 19, creatinine  0.96, glucose 92, total bilirubin 1.0, alkaline phosphatase 55, AST 22,  ALT 49, total protein 5.3, albumin 3.0, INR 1.1, CK 25, MB 2.4, troponin-  I less than 0.01.   ASSESSMENT/PLAN:  1. Atrial fibrillation with rapid ventricular response.  The patient      is mildly symptomatic with complaints of tachy palpitations.  Her      rates are elevated currently.  See that she will be initiated on      beta-blocker, and we will also add IV diltiazem.  She has      previously normal LV function.  Ideally, we hope to be able to rate      control her and perhaps she will convert on her own.  Her CHADS      score is 1 and she is currently on aspirin only.  In the setting of      ulcerative colitis with prior GI bleeding, we would prefer to avoid      anticoagulation at this time; however, we will check with Dr.      Leone Payor whether or not she could be anticoagulated.  If we cannot      adequately rate control her, we would likely have to consider PE      and cardioversion with short-term Coumadin.  If, however, we can      rate control her or if she converts on her own, we would likely      plan for discharge in the a.m.  We can arrange for an outpatient      followup echo as well  as monitoring to see our success with rate      control  and then may consider with regards to Coumadin.  Agree with      checking TSH and mag.  We will supplement her potassium, which is      3.7.  We will also be happy to take this patient on our service.  2. Lower extremity edema.  The patient attributes this to prednisone      therapy.  We would certainly have to question little diastolic      failure in the setting of rapid AFib.  As she does have significant      lower extremity edema, we will plan to add Lasix tonight.  3. History of coronary artery disease.  Despite rapid rate, she does      not actively have any chest discomfort.  She did have a fleeting      episode of chest discomfort earlier today.  We will cycle cardiac      markers.  She remains on beta-blocker and aspirin.  She is not      currently on statin, although previously was intolerant to Lipitor.  4. Hypertension, currently stable.      Nicolasa Ducking, ANP      Rollene Rotunda, MD, [facility]  Electronically Signed    CB/MEDQ  D:  01/15/2009  T:  01/16/2009  Job:  [id number]

## 2011-03-08 NOTE — Assessment & Plan Note (Signed)
[facility]                            CARDIOLOGY OFFICE NOTE   CHRISTOPHER, HINK                     MRN:          [id number]  DATE:02/04/2009                            DOB:          Aug 24, 1946    [name] is a pleasant female who I have seen recently for atrial  fibrillation that is symptomatic.  We would like to proceed with  cardioversion, but Coumadin was an issue given her history of uveitis  and ulcerative colitis.  I spoke with Dr. Leone Payor today and also to Dr.  Rozelle Logan of Ophthalmology.  They both felt that we could proceed with  Coumadin.  Dr. Leone Payor felt that if she developed GI bleeding, we could  deal with it at that point.  We therefore begin Coumadin with a goal of  cardioverting her in the near future.     Madolyn Frieze Jens Som, MD, [facility]  Electronically Signed    BSC/MedQ  DD: 02/04/2009  DT: 02/05/2009  Job #: [id number]

## 2011-03-08 NOTE — Discharge Summary (Signed)
NAMESARYAH, LOPER              ACCOUNT NO.:  [id number]   MEDICAL RECORD NO.:  [id number]          PATIENT TYPE:  INP   LOCATION:  2008                         FACILITY:  [facility]   PHYSICIAN:  Veverly Fells. Excell Seltzer, MD  DATE OF BIRTH:  April 06, 1946   DATE OF ADMISSION:  03/06/2007  DATE OF DISCHARGE:  03/26/2007                         DISCHARGE SUMMARY - REFERRING   DISCHARGE DIAGNOSES:  1. Chest discomfort of uncertain etiology.  Cardiac catheterization      showed nonobstructive LAD disease with possible occluded second      diagonal which was felt to be old, normal LV function.  2. Extensive right groin hematoma post cardiac catheterization.  3. Right femoral pseudoaneurysm.  4. Normocytic anemia secondary to #2 and #3 requiring 14 units of      packed rbcs.  5. Hypertension.  6. Status post right femoral pseudoaneurysm repair.  7. Hypokalemia.  8. Hypoglycemia.  History as noted below.   PROCEDURES PERFORMED:  Cardiac catheterization by Dr. Excell Seltzer on Mar 06, 2007 status post right drainage of large hematoma, VPA of the right SFA  by Dr. Darrick Penna on Mar 13, 2007.   DISCHARGE PHYSICIAN:  Dr. Excell Seltzer.   PRIMARY CARE PHYSICIAN:  Dr. Thomos Lemons.   GASTROENTEROLOGY PHYSICIAN:  Dr. Leone Payor.   SUMMARY OF HISTORY:  Ms. Culton is a 65 year old female who presented  to the emergency room with substernal chest discomfort radiating to both  arms associated with nausea and vomiting.  She felt it was similar to  her myocardial infarction discomfort back in April 2008 when she had  unsuccessful PTCA of the diagonal lesion that has been medically  managed.   PAST MEDICAL HISTORY:  Notable for ulcerative colitis, hypertension,  hyperlipidemia, PAF.   LABORATORY DATA:  Admission H&H was 11.9 and 35, normal indices,  platelets 202, WBC 6.6.  By the 14th her H&H had dropped to a low of 7.1  and 21.2, normal indices, platelets 159, WBC 4.2.  On the 15th H&H was  7.5 and 22.3.  On the 15th  9.8 and 29.4; on the 17th 7.6 and 22.6; on  the 18th 10 and 29.9; on the 19th 7.7 and 22.5; on the 20th 11.8 and  34.7.  At the time of discharge labs CBC was on the 31st.  This showed  H&H 10.1 and 30.5, normal indices, platelets 430, WBC 6.9.  PT on the  13th was 15.5, PTT on the 20th was greater than 200, PT 20.  I-STAT  showed sodium 141, potassium 3.6, BUN 13, creatinine 1.3, glucose 105.  Chemistry on the 14th showed a potassium 3.4, BUN 11, creatinine 1.07,  glucose 109.  Subsequent chemistry showed some slight variabilities in  potassium.  At the time of discharge on the 2nd, sodium was 140,  potassium 3.6, BUN 14, creatinine 1.01, glucose 90.  GFR 56.  Two point  of care markers were unremarkable.  Admission CK-MB was 28 and 1.5,  troponin 0.03, lipase on admission was 25.  Clostridium difficile on the  27th was negative.   Chest x-ray on the 13th showed mild increased interstitial opacities,  suspected mild interstitial edema with atypical infection would also be  a consideration. On the 14th this was felt to be stable compared to  prior exam.  Abdominal CT without contrast revealed moderate to large  hemorrhage/hematoma with the anterior lateral right upper thigh inguinal  region extending superiorly to the level of her lower abdomen.  No  evidence of intrapelvic hematoma.  Pelvis CT showed moderate to large  hemorrhage within the right subcutaneous tissues of the right thigh  extending into the lower right abdominal subcutaneous tissues, small to  moderate bilateral pleural effusions with bibasilar atelectasis, status  post right nephrectomy, mildly enlarged right retroperitoneal lymph node  nonspecific but suspect reactive.  The patient had a history of primary  neoplasm; metastatic disease is not entirely excluded and recommended  further followup.  Abdominal CT on the 17th showed large right thigh  hematoma in the proximal thigh felt to be stable.  Status post   cholecystectomy, nephrectomy, stable moderate bilateral pleural  effusions, and compressive atelectasis.  A CT of the extremities on the  17th again showed a hematoma.  Echocardiogram on May 13 revealed EF of  55-65%, inadequate for wall motion abnormalities, mild focal basal  septal hypertrophy, left atrial enlargement.  EKG on admission showed  normal sinus rhythm, normal axis, T-wave inversion in 1 and aVL.  No  acute changes.   HOSPITAL COURSE:  Ms. Gathers was admitted to the hospital and placed  on IV heparin.  Dr. Daleen Squibb felt given her ST segment changes that she  should undergo cardiac catheterization.  This was performed on the 13th  with Dr. Excell Seltzer.  This revealed a normal RCA, nonobstructive proximal  circumflex plaque, nonobstructive plaque in the diagonal and 100%  diagonal 2. Normal LV function.  Dr. Excell Seltzer felt this was noncardiac,  just discomfort.  An echocardiogram was performed as previously  described.  Post sheath removal and bedrest the patient was ambulating.  The patient developed some pain at the catheterization site.  Nursing  noted an increased size of  hematoma with good pulses.  Residents were  called for evaluation given her hypotension and mental status changes.  Findings were concerning for expanding hematoma with retroperitoneal  bleed.  She was typed and crossed, and CTs were performed as previously  described.  She was transfused with 2 units packed rbcs as well as IV  fluids with improvement in her blood pressure.  The patient stayed with  good  mental status.  Her bed rest was continued as well as her IV  fluids.  Plavix was held.  Antihypertensives were also held.  Despite  being transfused her H&H initially increased and dropped.  She was  transfused another 2 units of blood.  By the 16th Dr. Eden Emms felt that  she could be transferred to the floor.  After moving to bedside commode  on the 16th the patient had increasing leg pain.  Measurements  were started on her upper thigh for a period of 24 hours, and this was  increased by 2 cm. Despite her previous 4 units of packed rbcs, her  blood counts continued after the initial increase would drop.  She was  transfused another 2 units of blood.  Dopplers were performed that did  show a pseudoaneurysm in the right groin with a very small neck.  There  is also the possibility of common femoral cerebral vein thrombosis, but  it was difficult to evaluate given her hematoma and swelling.  Vascular  surgery  with Dr. Madilyn Fireman was consulted.  CTs were performed.  Vascular  surgery is considering pseudo injection; however, on Mar 13, 2007 they  performed a right femoral pseudo aneurysm drainage of large hematoma of  the right groin and VPA of the right SFA.  Post procedure, red blood  cell counts again had dropped.  She received another 2 units packed rbcs  for a total of 8.  She remained in the CCU.  By May 20 PT assisted with  ambulation and education.  Dr. Darrick Penna continued to follow and assisted  with wound management.  She continued to do well with improved gradual  improvement of her mobility.  Medications were adjusted for her  hypertension.  On the 22nd it was felt that the patient could be  transferred to the unit 2000 by the 23rd.  Lenard Simmer had noted that  she had received a total of 14 units packed rbcs throughout this  admission.  On the 21st she was noted to have some volume overload.  She  was treated with Lasix and potassium.  Case management arranged for  [facility] to assist with needs at home for pending  discharge.  Dr. Myrtis Ser noted on the 25th she had 25 beats of accelerated  idioventricular rhythm at 98 beats per minute.  Continue to follow.  Her  potassium was noted to be 3.8 at the time.  Norvasc was added for her  hypertension.  She complained of diarrhea.  Clostridium difficile was  negative.  She gradually improved.  Progression nurses in PT continued   to help with education and ambulation.  By the 27th the patient was  feeling much better, and was ambulating the hallways.  Dr. Excell Seltzer  decided not to resume her Plavix.  Her JP drain from the incision site  continued to have slow drainage.  The drain was discontinued on Mar 21, 2007.  Foley catheter was discontinued.  Some staples were removed on  the 30th.  The hospital felt that she should be able to return home soon  with home health.  She continued to do well, and by June 2 with Dr.  Excell Seltzer and Dr. Darrick Penna felt that she could be discharged home.   DISPOSITION:  Ms. Bonneau was discharged home on March 26, 2007.  Her  activity will be guided per PT.  Wound care will be guided per Dr.  Darrick Penna.  [facility].  She was asked to  call Dr. Evelina Dun office to arrange follow-up appointment for wound check  on this coming Friday.  She will see Dr. Excell Seltzer in the office on April 05, 2007 at 2:30.  Case managers have arranged for [facility] for wound care and PT and assistance at home.   NEW MEDICATIONS:  Her new medications include Norvasc 10 mg daily and  losartan 100 mg daily.  Her metoprolol was increased to 75 mg b.i.d.,  and she was asked to continue on her aspirin 81 mg daily, Nexium 40 mg  daily, Lexapro 10 mg daily.  She was advised to bring all medications to  all appointments and not to continue her Plavix.   Discharge time 45 minutes.      Joellyn Rued, PA-C      Veverly Fells. Excell Seltzer, MD  Electronically Signed    EW/MEDQ  D:  03/26/2007  T:  03/26/2007  Job:  [id number]   cc:   Janetta Hora. Darrick Penna, MD

## 2011-03-08 NOTE — Discharge Summary (Signed)
NAMESIRI, BUEGE              ACCOUNT NO.:  [id number]   MEDICAL RECORD NO.:  [id number]          PATIENT TYPE:  INP   LOCATION:  3733                         FACILITY:  [facility]   PHYSICIAN:  Verne Carrow, MDDATE OF BIRTH:  09/30/1946   DATE OF ADMISSION:  01/15/2009  DATE OF DISCHARGE:  01/16/2009                               DISCHARGE SUMMARY   PRIMARY CARDIOLOGIST:  Madolyn Frieze. Jens Som, MD, [facility].   PRIMARY CARE PHYSICIAN:  Barbette Hair. Artist Pais, DO.   GASTROENTEROLOGIST:  Iva Boop, MD,FACG   DISCHARGE DIAGNOSIS:  Atrial fibrillation with rapid ventricular  response.   SECONDARY DIAGNOSES:  1. Coronary artery disease status post previous myocardial infarction      secondary to occlusion of the diagonal which was medically managed.  2. History of right groin pseudoaneurysm following cardiac      catheterization with surgical repair in 2008.  3. Hypertension.  4. Hyperlipidemia.  5. Ulcerative colitis with recent flare on prednisone taper.  6. Status post cholecystectomy.  7. 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   ALLERGIES:  CODEINE and LATEX.   PROCEDURES:  None.   HISTORY OF PRESENT ILLNESS:  A 65 year old Caucasian female with the  above problem list.  She recently has been feeling strange over the past  few weeks and noted occasional tachy palpitations.  She also noted an  increase in dyspnea on exertion and mild decrease in exercise tolerance.  Because of this she saw Dr. Artist Pais on March 25 and she was noted to be  tachycardic.  ECG was performed showing atrial fibrillation with rapid  ventricular response.  There were no acute ST-T changes.  She also  reported increasing lower extremity edema since being placed on  prednisone therapy and she was admitted for further evaluation.  We were  asked to consult on the patient and then subsequently assumed care.  With her history of ulcerative colitis we preferred to avoid  anticoagulation especially in  light of her CHADS score of 1.  We did  place her on intravenous Diltiazem infusion with modest rate control  with rates in the 80s to low 100s.  We subsequently switched her to oral  Diltiazem and have also added beta-blocker therapy.  At this point her  rates are still in the 80s to 90s and the patient would like to be  discharged home this evening.  As she is only minimally symptomatic we  felt that this was okay.  We have added full strength aspirin at 325 mg  daily and I have arranged for her to undergo Holter monitoring as well  as 2-D echocardiogram to determine how well rate controlled she is or  whether or not she converts.  The plan at this point is that if she does  not convert we would have to further discuss the possibility of Coumadin  anticoagulation with Dr. Stan Head who is her gastroenterologist as  she may require TEE and cardioversion as an outpatient.   Overall, [name] continues to feel reasonably well.  She, again, is  eager to be discharged this evening and she  will be discharged home  today in satisfactory condition.   DISCHARGE LABS:  Hemoglobin 13.4, hematocrit 39.8, WBC 6.4, platelets  223, sodium 139, potassium 4.6, chloride 100, CO2 28, BUN 23, creatinine  1.1, glucose 135, total bilirubin 1.0, alkaline phosphatase 55, AST 22,  ALT 49, total protein 5.3, albumin 3.0, calcium 8.8, magnesium 2.1, CK  18, MB 2.3, troponin I less than 0.01.  TSH 3.120.  Free T4 1.27.  Urinalysis negative.   DISPOSITION:  The patient will be discharged home today in good  condition.   FOLLOWUP PLANS AND APPOINTMENTS:  She will pick up a Holter monitor and  also undergo 2-D echocardiogram on Monday, March 29 at 10:30 and 12:15  respectively.  She will follow up with Dr. Olga Millers in our [facility] office on April 7 at 10:30 a.m. or sooner if necessary.   DISCHARGE MEDICATIONS:  1. Aspirin 325 mg daily.  2. Diltiazem CD 360 mg daily.  3. Align one tab daily.   4. Nexium 40 mg daily.  5. Lexapro 10 mg daily.  6. Prednisone 50 mg daily and then taper as directed.  7. Multivitamin one daily.  8. Lialda 1.2 mg two tabs daily.  9. Benicar 20 mg daily.  10.Toprol-XL 50 mg daily.  11.NOTE:  In light of initiating Diltiazem therapy we have      discontinued her Azor which contains Norvasc.  This being the case      we gave her a prescription for Benicar, the other drug included in      Azor.   OUTSTANDING LAB STUDIES:  Holter monitoring and 2-D echocardiogram March  29.   DURATION DISCHARGE ENCOUNTER:  Sixty minutes including physician time.      Nicolasa Ducking, ANP      Verne Carrow, MD  Electronically Signed    CB/MEDQ  D:  01/16/2009  T:  01/16/2009  Job:  [id number]   cc:   Barbette Hair. Artist Pais, DO

## 2011-03-08 NOTE — Op Note (Signed)
NAMESHUNTE, [name]              ACCOUNT NO.:  [id number]   MEDICAL RECORD NO.:  [id number]          PATIENT TYPE:  INP   LOCATION:  2905                         FACILITY:  [facility]   PHYSICIAN:  [name], MD  DATE OF BIRTH:  10-04-1946   DATE OF PROCEDURE:  DATE OF DISCHARGE:                               OPERATIVE REPORT   PROCEDURE:  1. Evacuation of hematoma right groin.  2. Vein patch angioplasty of right superficial femoral artery.   PREOPERATIVE DIAGNOSIS:  Pseudoaneurysm right femoral artery.   POSTOPERATIVE DIAGNOSIS:  Pseudoaneurysm right femoral artery.   ANESTHESIA:  General.   ASSISTANT:  Rowe Clack, P.A.-C.   INDICATIONS:  The patient is a 65 year old female who is status post  cardiac catheterization via right femoral approach.  She has a known  pseudoaneurysm and has had recurrent episodes of bleeding.  She now has  a large hematoma in the right groin which requires evacuation due to  skin compromise.  She has been hemodynamically stable but has required  multiple transfusions over the last few days.   OPERATIVE FINDINGS:  1. Laceration of right superficial femoral artery.  2. Greater saphenous vein patch of right superficial femoral artery.   OPERATIVE DETAILS:  After obtaining informed consent, or after obtaining  implied consent urgently from the patient who had received some  sedation, the patient was taken to the operating room.  Attempts were  made to contact the family and the procedure was discussed with him  prior to beginning the procedure in the operating room.  The patient was  taken to the operating room and placed in supine position on the  operating table.  After induction of general anesthesia and endotracheal  intubation, the patient's right abdomen, groin and upper thigh were  prepped and draped in the usual sterile fashion.  Longitudinal incision  was made based on anatomic land marks over the right common femoral  artery.   Incision was carried down through the subcutaneous tissues.  There was edema and hematoma throughout.  This was all evacuated.  Dissection was carried down to the level of the superficial femoral  artery where bleeding was encountered.  There was a hole in the right  superficial femoral artery approximately 1.5 cm from its origin.  The  hole was transverse in nature.  There was active bleeding.  An attempt  at primary repair of this was made with interrupted 5-0 Prolene sutures.  However, due to the friable character of the artery and the depth of her  groin, the repair was not successful.  Therefore the right common  femoral artery was dissected free circumferentially.  A vessel loop was  placed around this.  The patient was given 5,000 units of intravenous  Heparin.  Vessel loop was also placed around the distal superficial  femoral artery below the area of the tear and around the profunda  femoris artery.  After vascular control had been obtained, the artery  was inspected.  There was a small amount of posterior plaque in the  superficial femoral artery.  The hole in the artery was  opened slightly  more longitudinally, proximally and distally to obtain good end points  and the artery ws gently debrided back to more healthy tissue.  A piece  of saphenous vein was then harvested from the more medial portion of the  incision.  This was approximately 2 cm in length. It was taken from just  below the saphenofemoral junction into the thigh.  This was ligated  proximally and distally with 2-0 Silk ties. The vein was then excised.  There was a small amount of thrombus within this.  The vein was then  opened longitudinally and irrigation with heparinized saline.  The vein  was then sewn on as a patch angioplasty using a running 6-0 Prolene  suture. Just prior to completion anastomosis, it was full blood back  bleed and thoroughly flushed.  Anastomosis was secured, clamps were  released.  There  was a palpable pulse below the repair of the  superficial femoral artery immediately.  There were good Doppler signals  in the SFA, profunda and common femoral artery.  There were also good  dorsalis pedis and posterior tibial Doppler signals in the right foot  and the color of the foot turned pink immediately.  Next, all of the  hematoma cavity was evacuated.  This was a large very cavity extending  all the way down into the medial thigh and laterally up along the right  hip.  Two 10 flat Jackson-Pratt drains were placed into the hematoma  cavity and into the right groin over the common femoral artery and  superficial femoral artery repair.  These were brought out through  separate stab incisions and sutured to the skin with Nylon sutures.  The  groin was then closed with multiple layers of 2-0 and 3-0 Vicryl  sutures.  The skin was closed with staples.  The patient tolerated the  procedure well and there were no complications.  Instrument, sponge and  needle count was correct at the end of the case.  The patient was taken  to the recovery room in stable condition.      [name], MD  Electronically Signed     CEF/MEDQ  D:  03/13/2007  T:  03/13/2007  Job:  [id number]

## 2011-03-08 NOTE — Assessment & Plan Note (Signed)
OFFICE VISIT   LERA, GAINES  DOB:  11/03/1945                                       03/30/2007  ZOXWR#:[id number]   SUBJECTIVE:  Ms. Bumpass returns for followup today and examination of  her right groin incision.  She underwent vein patch angioplasty of her  right superficial femoral artery and evacuation of hematoma in the right  groin on 03/13/2007.   EXAMINATION:  Blood pressure is 130/71, heart rate is 71.  She is  afebrile.  The right groin wound still has some liquefied hematoma  draining from it.  There is a 4 x 3 cm separation of the wound with good  granulation tissue at the base.  She still has some edema in the thigh  and right abdominal wall.   ASSESSMENT:  Overall Ms. Brach is doing well.  This wound should heal  up within about 4-6 weeks.   PLAN:  I have told her that she can resume her normal activities.  She  is currently doing normal saline wet to dry dressings and a friend is  helping her do these.  The remainder of her staples were removed today.   [name], MD  Electronically Signed   CEF/MEDQ  D:  03/30/2007  T:  04/02/2007  Job:  [id number]   cc:   Veverly Fells. Excell Seltzer, MD

## 2011-03-08 NOTE — Assessment & Plan Note (Signed)
[facility]                         GASTROENTEROLOGY OFFICE NOTE   NAME:[name], [name] LABARBERA                     MRN:          [id number]  DATE:06/28/2007                            DOB:          09/11/1946    CHIEF COMPLAINT:  Epigastric pain and diarrhea.   INTERVAL HISTORY:  [name] was seen back in May and we were  planning on an EGD and colonoscopy because of nausea, and vomiting, and  diarrhea, and ulcerative colitis. However she developed chest pain, went  to the hospital, had a cardiac catheterization, had a pseudo aneurysm,  and 14 unit bleed, and surgical evacuation of her hematoma in the groin,  and repair of the pseudo aneurysm. She is now recovered from that. No  more vomiting or nausea but having some intermittent epigastric pain, is  having 8 to 10 small volume lose stools a day as she has had previously  with her reported ulcerative colitis problems. She is not in any  significant distress at this time.   PAST MEDICAL HISTORY:  Problems, see my list from Feb 27, 2007 plus the  problems as mentioned above.   MEDICATIONS:  Listed and reviewed in the chart.   PHYSICAL EXAMINATION:  Weight 177 pounds, pulse 60, blood pressure  122/72.  LUNGS: Clear.  HEART: S1, S2. No murmurs, rubs, or gallops.  ABDOMEN: Soft. She is mildly tender in the epigastrium without  organomegaly or mass.  There is a scar from her pseudo aneurysm repair on the right groin. It  is closed. There are still some residual ecchymoses in the right thigh  and distal groin. She is alert and oriented x3.   ASSESSMENT:  1. Epigastric pain, unclear etiology.  2. Diarrhea, history of ulcerative colitis, really needs a full      colonoscopy, has not really had one in years.  3. Recent problems with post hemorrhagic anemia and her pseudo      aneurysm repair.   PLAN:  1. Check CBC.  2. Go ahead and schedule the EGD and colonoscopy as previously      planned. Further  plans pending that. She will use loperamide and/or      Lomotil in the interim.     Iva Boop, MD,FACG  Electronically Signed    CEG/MedQ  DD: 06/28/2007  DT: 06/28/2007  Job #: [id number]   cc:   Veverly Fells. Excell Seltzer, MD  Barbette Hair. Artist Pais, DO  Charles E. Darrick Penna, MD

## 2011-03-08 NOTE — H&P (Signed)
[name], [name]              ACCOUNT NO.:  [id number]   MEDICAL RECORD NO.:  [id number]          PATIENT TYPE:  INP   LOCATION:  2914                         FACILITY:  [facility]   PHYSICIAN:  Vernice Jefferson, MD          DATE OF BIRTH:  1946/02/24   DATE OF ADMISSION:  DATE OF DISCHARGE:                              HISTORY & PHYSICAL   PRIMARY CARDIOLOGIST:  Rollene Rotunda, MD   CHIEF COMPLAINT:  Chest pain and nausea.   HISTORY OF PRESENT ILLNESS:  The patient is a 65 year old white female  with a history of myocardial infarction back in April of 2008 (stenting  status post unsuccessful PTCA of D-1 lesion that was medically managed)  who comes in with substernal chest pain that began at approximately 7  p.m. today.  The patient states that it is pressure like, radiates to  both arms and occurred after vomiting today.  The patient states it is  similar to her previous MI pain.  She has had no significant chest pain  since her last admission until today.  The chest pain has slowly  resolved here in the ED.  The nausea was similar on presentation to her  prior MI.   PAST MEDICAL HISTORY:  1. Coronary artery disease, status post MI with D-1 occlusion that was      unable to cross with a balloon in April 2008.  2. Ulcerative colitis.  3. Hypertension.  4. Hyperlipidemia.  5. Paroxysmal atrial fibrillation.   SOCIAL HISTORY:  She is married with 3 children and lives with  [place].  No tobacco, no drugs, no alcohol use.   FAMILY HISTORY:  Reviewed and noncontributory to the patient's current  medical.   ALLERGIES:  CODEINE AND LASIX.   MEDICATIONS:  1. Aspirin 81 mg daily.  2. Plavix 75 mg daily.  3. Metoprolol 25 mg b.i.d.  4. Lexapro 10 mg nightly at bedtime.  5. Nexium 40 mg daily.  6. Losartan has not been initiated, as the patient has a nutrition      form and could not tolerate the Statin.  This was, therefore,      discontinued.   REVIEW OF SYSTEMS:  Negative.  An  11-point review of systems was done.  Please see the above HPI.  The patient does report worse PND as well as  lower extremity edema worse over the past 1 to 2 weeks.   PHYSICAL EXAMINATION:  VITAL SIGNS:  Blood pressure 132/67, heart rate  70, afebrile, respiratory rate 20.  GENERAL:  Well-developed, well-nourished white female in no acute  distress.  HEENT:  Noncontributory.  No scleral icterus.  No conjunctival pallor.  NECK:  Supple, full range of motion.  Jugular venous distention to  approximately 10 cm H20.  CARDIOVASCULAR:  Regular rate and rhythm.  Without rubs, murmurs or  gallops.  CHEST:  Clear to auscultation bilaterally.  No wheezes, rales or  rhonchi.  ABDOMEN:  Bowel sounds present, nondistended, normoactive bowel sounds.  EXTREMITIES:  Pulses are 2+ bilaterally.  There is now 1+ pretibial  edema.  NEURO:  Exam nonfocal.   Chest x-ray was not performed.   EKG demonstrates normal sinus rhythm, normal axis.   LABORATORY DATA:  Hemoglobin 10.5.  BUN and creatinine 13 and 1.3.  Cardiac enzymes are negative x2 sets.   IMPRESSION:  1. Acute coronary syndrome, unstable angina.  2. Hypertension.  3. Hyperlipidemia.  4. History of coronary artery disease.  5. History of _______.  6. History of ulcerative colitis.  7. History of depression.   PLAN:  Will admit the patient to the Step-Down unit.  She is currently  on a nitroglycerin drip and will titrate this down for her chest pain as  well as give morphine.  Initial biomarkers are negative.  Her EKG is  unchanged from her discharge EKG.  I do not feel she needs to be  urgently moved to the cardiac catheterization lab and will medically  manage this for now.  Continue with beta blocker.  Follow with enzymes.  Will continue left heart catheterization.  If she remains negative, may  consider noninvasive stress testing.  Remain NPO for now.  Also will get  her ultrasound and evaluate her while she has been having  symptoms  consistent with left ventricular systolic dysfunction.  Also will  evaluate her PA pressures given her history of obstructive sleep apnea.  Additionally, will continue her on her beta blocker for now.  Will  continue her anti-platelet and start her on heparin drip. CBC monitoring  in the morning.      Vernice Jefferson, MD  Electronically Signed     JT/MEDQ  D:  03/06/2007  T:  03/06/2007  Job:  [id number]

## 2011-03-08 NOTE — Assessment & Plan Note (Signed)
OFFICE VISIT   INAAYA, VELLUCCI  DOB:  [birth year]                                       04/13/2007  EAVWU#:[id number]   The patient returns today for further followup and evaluation of her  right groin wound after repair of pseudo aneurysm.   EXAMINATION:  Blood pressure is 136/72, heart rate is 65 and regular.  The would is granulating well.  There is still some serous drainage from  this occasionally.  There was a small satellite ulceration in the distal  thigh which had some serous drainage from it.  I opened this slightly  with a Q-tip today.  I showed her friend who was doing her wound care  how to keep this open to allow drainage.  She will continue with saline  wet-to-dry dressings in the groin.  She will follow up in one week's  time.   [name], MD  Electronically Signed   CEF/MEDQ  D:  04/13/2007  T:  04/16/2007  Job:  [id number]

## 2011-03-08 NOTE — Assessment & Plan Note (Signed)
OFFICE VISIT   GRACIANNA, VINK  DOB:  [birth year]                                       04/06/2007  [id number]#:[id number]   Ms. Crumbley returns today for further followup and evaluation of her  right groin wound.  She is doing normal saline wet-to-dry dressings.  She apparently had several nodules along the anterior portion of her  thigh which was concerning to her.  She has also still had some dark  bloody drainage from the groin wound itself.  She has a friend who is a  former operating room nurse who has been doing her wound care.   On exam today, the wounds in the groin are granulating well.  There was  some fibrinous exudate at a small opening in the incision at the lower  aspect.  This was debrided in the office today and there was good  bleeding tissue and this should begin to granulate as well.  She does  have several nodules in the distal portion of her thigh.  I believe this  is all related to resolving hematoma.  I do not believe this represents  an abscess, nor does it require incision and drainage.  She has been  afebrile and overall feels well.  The drainage she has been having from  her wound is very similar to 1 week ago which appeared to be old blood.  Most of the firm hematoma in her right hip region has completely  resolved.  She apparently was placed on Augmentin and Cipro by Dr. Artist Pais  recently.  I told her to finish this course of antibiotics since  stopping soon tends to lead to resistant antibiotics.   She will follow up with me in 1 week's time for an additional wound  check.   [name], MD  Electronically Signed   CEF/MEDQ  D:  04/08/2007  T:  04/09/2007  Job:  [id number]   cc:   Veverly Fells. Excell Seltzer, MD  Barbette Hair. Artist Pais, DO

## 2011-03-08 NOTE — Assessment & Plan Note (Signed)
[facility]                            CARDIOLOGY OFFICE NOTE   RONIQUA, KINTZ                       MRN:          [id number]  DATE:02/21/2007                            DOB:          1946-06-05    [name] returns for outpatient followup after recent  hospitalization at [facility] on April 15th.  She initially presented on  April 7th with an acute myocardial infarction that involved an acute  diagonal occlusion.  She had a nonobstructive disease elsewhere.  The  diagonal was unable to be opened, and Ms. Havlik was treated  medically.  She did very well with medical therapy but presented again  to the hospital on April 15th with atypical chest pain.  She was ruled  out for myocardial infarction.  She underwent an initial GI evaluation  during her hospital stay because she was complaining of severe diarrhea,  which is a longstanding problem for her.   Currently, Ms. Maker denies any chest pain or dyspnea.  She has some  pain in her mid back after a long episode of vomiting yesterday.  The  vomiting is also a chronic problem that occurs on a monthly basis over  the past year.  She thinks her back pain is related to straining a  muscle during the vomiting episode.  She has not had any exertional  symptoms, palpitations, orthopnea, PND, edema, or other complaints.   CURRENT MEDICATIONS:  1. Aspirin 325 mg daily.  2. Metoprolol 25 mg twice daily.  3. Lexapro 10 mg daily.  4. Nexium 40 mg daily.  5. Plavix 45 mg daily.   ALLERGIES:  NKDA.   PHYSICAL EXAMINATION:  GENERAL:  Patient is alert and oriented.  She is  in no acute distress.  VITAL SIGNS:  Weight is 181 pounds.  Blood pressure 138/74, heart rate  63, respiratory rate 16.  HEENT:  Normal.  NECK:  Normal carotid upstrokes with a soft left carotid bruit.  LUNGS:  Clear to auscultation bilaterally.  HEART:  Regular rate and rhythm with a grade 2/6 systolic ejection  murmur  at the left upper sternal border.  There are no gallops or  diastolic murmurs.  ABDOMEN:  Soft and nontender.  No organomegaly.  EXTREMITIES:  No clubbing, cyanosis or edema.  Peripheral pulses are 2+  and equal throughout.   ASSESSMENT:  Ms. Lamos is currently stable from a cardiovascular  standpoint.  Her current cardiac problems are as follows:  1. Coronary artery disease, status post recent myocardial infarction:      Will continue her on aspirin and Plavix for one year.  She should      also stay on her metoprolol.  She is currently holding her Benicar      and Lipitor because both of these medicines have bothered her from      a gastrointestinal standpoint.  I would like her to continue her      current therapy, and if she needs further GI workup, including      colonoscopy with biopsies, it would be acceptable to hold her  Plavix around that time, as she has not had a stent placed and      would not be at a great deal of risk from a cardiac standpoint to      undergo that type of procedure.  2. Dyslipidemia:  Will recheck her lipids in eight weeks when she      returns for followup.  I did not push the issue with statins today      because she is having so much GI trouble, and this has exacerbated      that problem.  The vomiting and diarrhea are her main complaint at      present.  3. Left carotid bruit:  Will ask for a carotid ultrasound.  4. Followup:  I would like to see her back in eight weeks after her      carotid ultrasound and lipids are checked.  Also would like to      check an echocardiogram to assess her left ventricular function.      Her initial ventriculogram demonstrated an area of lateral wall      akinesis, and I would like to get a better assessment of her      overall left ventricular function with an echocardiogram at the      time of her return visit.     Veverly Fells. Excell Seltzer, MD  Electronically Signed    MDC/MedQ  DD: 02/21/2007  DT: 02/22/2007   Job #: [id number]

## 2011-03-08 NOTE — Assessment & Plan Note (Signed)
OFFICE VISIT   DELANY, STEURY  DOB:  1946/06/16                                       06/06/2007  ZOXWR#:[id number]   REASON FOR VISIT:  Ms. Arrasmith returns for followup today.  She had  repair of a femoral pseudoaneurysm and evacuation of hematoma in her  right groin on May 20.  We have been following her for a groin wound  from this operation.   On exam today, the wound is essentially completely healed.  Foot is warm  and well-perfused.  She will follow up on an as needed basis.   [name], MD  Electronically Signed   CEF/MEDQ  D:  06/06/2007  T:  06/08/2007  Job:  [id number]   cc:   Veverly Fells. Excell Seltzer, MD  Barbette Hair. Artist Pais, DO

## 2011-03-08 NOTE — H&P (Signed)
[name], [name]              ACCOUNT NO.:  [id number]   MEDICAL RECORD NO.:  [id number]          PATIENT TYPE:  INP   LOCATION:  2605                         FACILITY:  [facility]   PHYSICIAN:  Madolyn Frieze. Jens Som, MD, FACCDATE OF BIRTH:  Feb 23, 1946   DATE OF ADMISSION:  02/26/2009  DATE OF DISCHARGE:                              HISTORY & PHYSICAL   PRIMARY CARDIOLOGIST:  Madolyn Frieze. Jens Som, MD, [facility]   PRIMARY CARE PHYSICIAN:  Barbette Hair. Artist Pais, DO   HISTORY OF PRESENT ILLNESS:  This 65 year old Caucasian female well  known to Dr. Olga Millers with history of paroxysmal atrial  fibrillation, CAD, hyperlipidemia, ulcerative colitis, and hypertension  who presented to Dr. Olegario Messier office secondary to complaints of fatigue,  weakness, headaches, nausea, and diarrhea.  In the office, Dr. Artist Pais  assessed her and the patient was found to be in atrial fib with RVR.  She was sent over to [facility] where she has been seen  and examined by myself and Dr. Jens Som.  In the interim, the patient  has been seen by the ER physician and started on Cardizem drip at 5 mg  an hour.  The patient is denying any chest pain or shortness of breath.  The patient has had medications adjusted prior to being admitted as an  outpatient and Benicar has recently been discontinued.  The patient has  also been very sensitive to Coumadin and her Coumadin dose has been very  closely watched by Shelby Dubin, Pharm.D., and her dose now is 1 mg every  other day with 0.5 mg.  Her PT/INR is therapeutic at present, at 26.5  and 2.3 respectively.   REVIEW OF SYSTEMS:  Positive for headache, nausea, diarrhea, and  generalized fatigue.  All other systems are reviewed and found to be  negative.   PAST MEDICAL HISTORY:  1. CAD.      a.     Status post myocardial infarction secondary to diagonal       occlusion, is being treated medically.  2. Right groin pseudoaneurysm, status post catheterization above.  3.  Hypertension x30 years.  4. Hyperlipidemia.  5. Ulcerative colitis.  6. Crohn disease.  7. Obstructive sleep apnea.  8. Biliary pancreatitis.  9. History of depression.   PAST SURGICAL HISTORY:  1. Cholecystectomy.  2. Right nephrectomy.  3. Right renal artery aneurysm repair.   SOCIAL HISTORY:  She lives in [place] with her husband.  She does not  smoke.  Does not drink alcohol.  No illicit drug use.   FAMILY HISTORY:  Mother with CVA and hypertension.  Father deceased from  cardiac complications of hyponatremia.   CURRENT MEDICATIONS AT HOME:  1. Coumadin 1 mg alternating every other day with 0.5 mg.  2. Nexium 40 mg daily.  3. Lexapro 10 mg daily.  4. Align caps 1 by mouth daily.  5. Metoprolol 50 mg q.a.m.  6. Glucosamine and chondroitin complex 1 tablet once a day.  7. Multivitamins daily.  8. __________ as needed.  9. Lialda 1.2 g 2 tablets every morning.  10.Diltiazem 240 mg daily  ALLERGIES:  CODEINE, MUCINEX MAXIMUM STRENGTH, LATEX, SULFA, and ANUSOL.   CURRENT LABORATORIES:  Sodium 139, potassium 4.5, chloride 113, CO2 of  19, glucose 106, BUN 15, and creatinine 1.20.  Hemoglobin 14.4,  hematocrit 43.0, white blood cells 10.6, and platelets 313.  PT 26.5,  INR 2.3, and troponin less than 0.05.  Chest x-ray revealing stable  cardiomegaly without any acute or active disease.  EKG revealing AFib  with RVR, ventricular rate of 121 beats per minute.   PHYSICAL EXAMINATION:  VITAL SIGNS:  Blood pressure 106/67, pulse 100-  120, respirations 17, temperature 98, and O2 sat 100% on 2 L.  GENERAL:  She is awake, alert, and oriented.  Affect is normal.  HEENT:  Head is normocephalic and atraumatic.  The patient wears  glasses.  EYES, PERRLA.  Mucous membranes of mouth are pink and moist.  NECK:  Supple.  There is no JVD.  No carotid bruits appreciated.  CARDIOVASCULAR:  Irregular rhythm, tachycardic without murmurs, rubs, or  gallops.  Pulses are 2+ and equal radial  and distal.  LUNGS:  Clear to auscultation without wheezes, rales, or rhonchi.  SKIN:  Warm and dry.  ABDOMEN:  Soft, nontender, obese with 2+ bowel sounds.  It is nontender.  EXTREMITIES:  Without clubbing, cyanosis, or edema.  NEUROLOGIC:  Cranial nerves II through XII are grossly intact.   IMPRESSION:  1. Atrial fibrillation with rapid ventricular response.  2. Hypertension.  3. Crohn disease.  4. Hyperlipidemia.  5. Coronary artery disease, status post myocardial infarction with      diagonal occlusion in the past, treated medically.   PLAN:  This is a 65 year old Caucasian female with past medical history  of CAD, status post MI, AFib, ulcerative colitis, hypertension, and AFib  with RVR.  The patient was recently seen in the office for AFib.  Coumadin indicated, but ultimately planned for [facility] cardioversion.  Over  the past week, the patient has had increased complaints of fatigue,  headache, and nausea.  She attributes this to her ulcerative colitis  medications.  The patient had a CT, which showed no bleed.  She was seen  by Dr. Artist Pais today with above complaints and heart rate was found to be  120-140.  She was sent to the emergency room for admission.  The patient  denies chest pain or shortness of breath or she did state that she has  had occasional palpitations, INR was 2.3, which the patient is very  sensitive to Coumadin and is on a very low dose.  ECG reveals atrial fib  with RVR.   We will admit to telemetry and proceed with TEE and DCCV in the a.m.  INR is therapeutic.  If AFib returns after cardioversion is completed,  we will add antiarrhythmic.  Continue Cardizem, beta-blocker for rate  control, and titrate as needed.  We will discontinue Coumadin 4 weeks  after DCCV with a CHADS score of 1 for hypertension with an increased  risk for bleeding and history of ulcerative colitis.  We will check a  TSH.  We will discontinue Lomotil as it has atropine in it and will   contribute to elevated heart rate.  We will discontinue Benicar with  blood pressure borderline.       Bettey Mare. Lyman Bishop, NP      Madolyn Frieze. Jens Som, MD, [facility]  Electronically Signed    KML/MEDQ  D:  02/26/2009  T:  02/27/2009  Job:  [id number]   cc:  Barbette Hair. Artist Pais, DO

## 2011-03-08 NOTE — Discharge Summary (Signed)
[name], [name]              ACCOUNT NO.:  [id number]   MEDICAL RECORD NO.:  [id number]          PATIENT TYPE:  INP   LOCATION:  2605                         FACILITY:  [facility]   PHYSICIAN:  Bevelyn Buckles. Bensimhon, MDDATE OF BIRTH:  July 18, 1946   DATE OF ADMISSION:  02/26/2009  DATE OF DISCHARGE:  02/28/2009                               DISCHARGE SUMMARY   PROCEDURES:  1. Direct current cardioversion.  2. Transesophageal echocardiogram.   PRIMARY FINAL DISCHARGE DIAGNOSIS:  Atrial fibrillation with rapid  ventricular response.   SECONDARY DIAGNOSES:  1. Obesity.  2. Coronary artery disease, status post cardiac catheterization in May      2008 with nonobstructive disease and medical therapy recommended.  3. Hyperlipidemia.  4. Ulcerative colitis.  5. Hypertension.  6. History of myocardial infarction secondary to diagonal occlusion      treated medically.  7. History of right groin pseudoaneurysm after catheterization.  8. History of Crohn disease.  9. Obstructive sleep apnea.  10.Biliary pancreatitis.  11.History of depression.  12.Status post cholecystectomy, right nephrectomy, and right renal      artery aneurysm repair.  13.Chronic anticoagulation with Coumadin.  14.Allergy or intolerance to CODEINE, MUCINEX, LATEX, SULFA, and      ANUSOL.   TIME AT DISCHARGE:  38 minutes.   HOSPITAL COURSE:  [name] is a 65 year old female with a history of  coronary artery disease.  She saw Dr. Artist Pais for weakness and was found to  be in AFib with rapid ventricular response.  She was admitted to the  hospital for further evaluation and treatment.   She had been on Coumadin because of a history of PAF.  Her rate was not  controlled on diltiazem IV and metoprolol, so a TEE cardioversion was  performed on Feb 27, 2009.  Her EF was 60% with LVH.  Her left atrium was  markedly dilated with smoke seen.  Her right atrium was mildly to  moderately dilated, and her left atrial appendage  had no clot.  The left  atrium was markedly dilated.  She was cardioverted with 200 biphasic  synchronized joules x1 to sinus rhythm.   On Feb 27, 2009, she was seen by Dr. Gala Romney.  He felt that given the  size of her left atrium, she was at very high risk for recurrent AFib.  The situation was discussed with Dr. Ladona Ridgel and with Dr. Jens Som.  They  both felt that she would benefit from Tikosyn, but the patient wished to  be discharged home for Mother's Day.  She was ambulating without chest  pain or shortness of breath and maintaining sinus rhythm.  Dr. Gala Romney  considered her stable for discharge on Feb 28, 2009, with close  outpatient followup.   DISCHARGE INSTRUCTIONS:  Her activity level is to be increased  gradually.  She is to stick to a low-fat diet.  She is to follow up with  Dr. Jens Som and with [facility].  She is to follow up with Dr. Artist Pais  as needed.   DISCHARGE MEDICATIONS:  1. Coumadin 0.5 and 1 mg alternating.  2. Nexium 40  mg a day.  3. Lexapro 10 mg a day.  4. Metoprolol 50 mg b.i.d.  5. Lomotil p.r.n.  6. Align daily.  7. Lialda 1.2 g daily.  8. Cardizem CD 240 mg daily.  9. Vitamins as prior to admission.      Theodore Demark, PA-C      Bevelyn Buckles. Bensimhon, MD  Electronically Signed    RB/MEDQ  D:  02/28/2009  T:  03/01/2009  Job:  [id number]   cc:   Barbette Hair. Artist Pais, DO

## 2011-03-09 ENCOUNTER — Telehealth (INDEPENDENT_AMBULATORY_CARE_PROVIDER_SITE_OTHER): Payer: Medicare Other | Admitting: Internal Medicine

## 2011-03-09 DIAGNOSIS — K219 Gastro-esophageal reflux disease without esophagitis: Secondary | ICD-10-CM

## 2011-03-09 MED ORDER — ESOMEPRAZOLE MAGNESIUM 40 MG PO CPDR: 40.0000 mg | DELAYED_RELEASE_CAPSULE | Freq: Two times a day (BID) | ORAL | Status: DC

## 2011-03-09 NOTE — Telephone Encounter (Signed)
Refill-nexium 40mg  cap. Take one capsule by mouth twice daily. Qty 180. Last fill 4.7.12

## 2011-03-11 NOTE — Consult Note (Signed)
[name], [name]              ACCOUNT NO.:  [id number]   MEDICAL RECORD NO.:  [id number]          PATIENT TYPE:  OBV   LOCATION:  6532                         FACILITY:  [facility]   PHYSICIAN:  Iva Boop, MD,FACGDATE OF BIRTH:  1945/11/24   DATE OF CONSULTATION:  DATE OF DISCHARGE:  02/07/2007                                 CONSULTATION   CHIEF COMPLAINT:  Diarrhea, nausea and vomiting, history of ulcerative  colitis.   HISTORY:  This is a 65 year old white woman with coronary artery disease  who had an acute MI 04/07.  She was treated medically.  She was  readmitted with chest pain and ruled out for MI this admission.  It was  felt that her chest pain was atypical.  She has a diagnosis of  ulcerative colitis since her 30s and has intermittent quiescent or  remission periods.  More recently she has had trouble, in the fall of  2007, she had a flare and was placed on Colazal and prednisone and what  must be Rowasa or hydrocortisone enemas.  She was seeing Dr. Karena Addison in [facility].  She was also having some intermittent nausea and  vomiting.  Some time after that in about January she was sick and she  was diagnosed with pancreatitis and she had a cholecystectomy.  She also  had a nephrectomy on the right in January 2008 at the same time because  she had some sort of infection in the kidney or mass apparently.  At  some point, around that time, she was also on sulfasalazine and then she  became fatigued and nauseous and anorectic, and so she stopped that.  She has not been on therapy for ulcerative colitis since February at  least.  She has lost 23 pounds since 10/2006.  Imodium does help  somewhat at this point.  She says she was told not to take prednisone  again by the physicians that removed her kidney, or at least she was  told to try to avoid it.  Lately, she has developed a rash on her back  with ulcers that are itching and papular lesions.  She wonders if she  has celiac disease and dermatitis herpetiformis.  She says Dr. Lanae Boast  and the PA there, as well as her primary care physician in [facility],  looked at it, but did not take a biopsy and told her they did not know  what it was.  It is very pruritic.  She has been reading on the  internet, wondering if she has celiac disease.  Apparently, at some  point the past, she had an EGD that was perhaps suggestive of celiac  disease, I think in 2007, according to the patient.  Of note, she was  anticipating seeing Dr. Lina Sar in 02/2007, as she tells me Dr.  Juanda Chance came highly recommended by a friend.  So, 55 pages of records  have been faxed to our office, but I have not been able to review them.   Her main complaints now are really diarrhea, some nausea, not much  abdominal pain is  described, and I do not get a history for bleeding.   ALLERGIES:  LATEX CAUSES DYSPNEIC, CODEINE CAUSES TACHYCARDIA.   MEDICATIONS:  1. Aspirin 325 mg daily.  2. Lipitor daily.  3. Plavix.  4. Lexapro.  5. Avapro.  6. Lopressor.  7. Protonix 40 mg daily.   PAST MEDICAL HISTORY:  1. Ulcerative colitis diagnosed in the 1970s as above.  2. Paroxysmal atrial fibrillation.  3. Coronary disease with recent MI 01/2006 on Plavix.  4. Nonobstructive disease and other anatomy not amendable to      percutaneous coronary intervention.  5. Obstructive sleep apnea diagnosed 2 months ago.  6. Obesity.  7. Pancreatitis in the fall of 2007.  8. 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  9. Hypertension.  10.Depression.  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   SOCIAL HISTORY:  She lives in [place] with her spouse.  She is a  housewife.  No alcohol, tobacco.   FAMILY HISTORY:  Her father had some sort of colitis, she thinks.   REVIEW OF SYSTEMS:  Fatigue is present, the rash and the pruritus,  prolapsing hemorrhoids.  She has not been able to  wear her CPAP mask due  to allergic reaction to the mass.  Atypical chest pain as noted.  All  other systems negative.   PHYSICAL EXAMINATION:  GENERAL:  A middle-aged white woman in no acute  distress.  VITAL SIGNS:  Temperature is 75, pulse 72, respirations 19, blood  pressure 123/46, 8.6 kg.  HEENT:  Eyes anicteric.  Mouth free of lesions.  NECK:  Supple.  No mass or thyromegaly.  CHEST:  Clear.  HEART:  S1-S2, no murmurs or gallops.  ABDOMEN:  Soft, nontender without organomegaly or masses.  Surgical  scars are well-healed.  EXTREMITIES:  No cyanosis, clubbing or edema.  NEURO:  She is alert and oriented x3.  PSYCHIATRIC:  Appropriate affect.  LYMPH NODES:  No neck or supraclavicular or groin adenopathy palpated.  SKIN:  There is excoriated papular rash on the upper back, left greater  than right.  She has some crusting lesions, really from excoriation.  There is no weeping or blisters or anything like that.   LABORATORY DATA:  She ruled out for MI, though she had a mildly positive  CK MB.  Her CBC on her labs shows hemoglobin 11.6, hematocrit 35, RDW  14.7, MCV 88, normal white count, normal platelets, they were clumped,  unable to estimate.  A TSH of 01/29/2007 was normal.  CBC 01/30/2007:  Platelets were 163.  She had hemoglobin of 9.9, that was after a cath.  She does have an ecchymotic area in the right groin.  Creatinine is 1.38  which is slightly elevated with a GFR estimated to be 39.  Sodium 134.  Other electrolytes normal (she has only one kidney), AST 41, albumin  2.9, total protein 5.9.   X-RAYS:  Portable chest 01/29/2007 showed no acute process.   ASSESSMENT:  Apparently, this lady has a history of ulcerative colitis.  She has actually never had a full colonoscopy.  She is somewhat  symptomatic, mainly with diarrhea.  It is a complicated history with  other comorbidities as outlined above.  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   PLAN AND RECOMMENDATIONS:  1. I think she can go home, and we will review the records and  get a      hold of her in the next day or two and make a therapy      recommendation.  2. She is going to need a colonoscopy at some point.  She has inquired      about the possibility of hydrotherapy.  I told her that my mind is      open to that as far as cleansing, but if she has ulcerative      colitis, insertion of a tube blindly into her colon could be a      problem, and her electrolyte issues with using water to cleanse      could be a problem given her kidneys, and I think overall using an      FDA approved prep makes the most sense at this point for her.  3. Regarding the possibility of celiac disease which she has raised      the question of, I told her I do not think she has that, though it      is possible, so tissue transglutaminase ICA antibody will be      ordered as well as serum ICA level.  4. Regarding her rash, she should probably have this biopsied.  Need      to see dermatology or primary care.  Perhaps she has dermatitis      herpetiformis, but I have been under the understanding that most of      that is on the extensor surfaces of the extremities. Though I could      be wrong about that.  She does not seem to have any rash on the      extremities.   I appreciate the opportunity to care for this patient.   Note, the patient understands that since I have seen her in  consultation, that I would more than likely end up being her  gastroenterologist at this point, we will arrange follow-up.  I will  discuss it with Dr. Juanda Chance whom the patient has a pending appointment  with in the middle of May.      Iva Boop, MD,FACG  Electronically Signed     CEG/MEDQ  D:  02/07/2007  T:  02/08/2007  Job:  [id number]   cc:   Veverly Fells. Excell Seltzer, MD  Barbette Hair. Artist Pais, DO

## 2011-03-11 NOTE — Cardiovascular Report (Signed)
[name], [name]              ACCOUNT NO.:  [id number]   MEDICAL RECORD NO.:  [id number]          PATIENT TYPE:  INP   LOCATION:  2807                         FACILITY:  [facility]   PHYSICIAN:  Veverly Fells. Excell Seltzer, MD  DATE OF BIRTH:  [birth year]   DATE OF PROCEDURE:  01/29/2007  DATE OF DISCHARGE:                            CARDIAC CATHETERIZATION   PROCEDURES:  1. Left heart catheterization.  2. Selective coronary angiography.  3. Left ventricular angiography.  4. Attempted PTCA of the first diagonal.  5. Star close the right femoral artery.   INDICATIONS:  [name] is a 65 year old woman who was in her normal  state of health this morning when she went to be fitted for a CPAP  device for obstructive sleep apnea.  She developed left-sided chest pain  and nausea and vomiting during the fitting for this device.  The EKG in  the field demonstrated anterior ST elevation.  She was referred urgently  for cardiac catheterization in the setting of ongoing chest pain and an  anterior ST elevation MI.   Risks and indications of the procedure were explained to the patient.  Informed consent was obtained.  The right groin was prepped, draped, and  anesthetized with 1% lidocaine.  A 6-French sheath was placed in the  right femoral artery using the modified Seldinger technique.  Multiple  views of the left and right coronary arteries were taken using standard  6-French Judkins catheters.  An angled pigtail catheter was then  inserted and the left ventricular pressure was recorded.  A 30-degree  RAO left ventriculogram was performed.  Pullback across the aortic valve  was done.   Following diagnostic angiography, it was clear that there was an  occluded ostial diagonal branch based on the appearance of the LAD, and  anterolateral dyskinesis on the ventriculogram.  I planned on attempting  PCI of this vessel.  Angiomax was used for anticoagulation.  An XB 3.5-  mm LAD guide catheter was  inserted.  Once therapeutic ACT was achieved,  a long Prowater wire was used to attempt crossing into the diagonal.  This was used alongside a 1.5-mm over the wire balloon.  I was unable to  pass this wire beyond the area of occlusion.  I then attempted the same  technique using an over-the-wire balloon for support with a whisper  wire.  I was still unable to cross the lesion and at that point I  elected to discontinue the procedure.   Postprocedure angiograms demonstrated no problem in the LAD proper, and  there was TIMI III flow throughout that vessel.   At the conclusion of the procedure a Star close device was used to seal  femoral arteriotomy.   FINDINGS:  Aortic pressure 119/59 with mean of 85, left ventricular  pressure 120/6, with an end-diastolic pressure of 16.   Left mainstem is angiographically normal and bifurcates into the LAD and  left circumflex.   LAD is a large-caliber vessel that courses down and wraps around the  left ventricular apex.  There is a large first septal perforator branch.  There is some  mild nonobstructive disease in the proximal portion of the  LAD.  There is an area of dye-staining along the lateral side of the mid  LAD that is suspicious for an occluded side branch.  The remainder of  the mid and distal segments of the LAD are angiographically normal.   Left circumflex is large caliber.  It courses down and gives off a small  first OM branch, and a large second OM branch.  There is also a large  third OM branch that bifurcates into twin vessels. The AV groove  circumflex is small.    The left circumflex system in its entirety has no significant  angiographic disease.   Right coronary artery is a large dominant vessel.  Distally, it  terminates in a PDA branch, which is a twin PDA system.  There are also  two posterolateral branches.  There is no significant angiographic  disease throughout the right coronary artery.   LEFT VENTRICULOGRAPHY:   Performed in the 30-degree right anterior  oblique projection demonstrates an anterolateral area of akinesis.  It  is a focal area in the mid portion of the ventricle.  The apex and  remaining portions of the left ventricle are normal.  The estimated LVEF  is 45%.   ASSESSMENT:  1. Acute myocardial infarction secondary to a first diagonal branch      occlusion.  2. Nonobstructive left anterior descending artery disease with a      normal right coronary artery and left circumflex.  3. Mild segmental left ventricular systolic dysfunction.   PLAN:  As described above, I attempted PCI of the first diagonal but was  unable to cross the lesion with a guidewire.  The patient should be  treated medically.  She is chest pain free and I would expect overall  that she does well from a cardiac standpoint.  She will remain on  aspirin with initiation of beta blocker and Statin therapy.  She will be  transferred to the post cath recovery area.      Veverly Fells. Excell Seltzer, MD  Electronically Signed     MDC/MEDQ  D:  01/29/2007  T:  01/29/2007  Job:  [id number]

## 2011-03-11 NOTE — Discharge Summary (Signed)
NAMEUZMA, HELLMER              ACCOUNT NO.:  [id number]   MEDICAL RECORD NO.:  [id number]          PATIENT TYPE:  OBV   LOCATION:  6532                         FACILITY:  [facility]   PHYSICIAN:  Veverly Fells. Excell Seltzer, MD  DATE OF BIRTH:  08-Oct-1946   DATE OF ADMISSION:  02/06/2007  DATE OF DISCHARGE:  02/07/2007                               DISCHARGE SUMMARY   PRINCIPAL DIAGNOSIS:  Atypical chest pain.   SECONDARY DIAGNOSES:  1. Coronary artery disease.      a.     Status post acute intraseptal ST-elevation myocardial       infarction January 29, 2007, with catheterization revealing total       occlusion of the proximal first diagonal with attempted PCI, which       was unsuccessful.  The lesion was medically managed.  2. Paroxysmal atrial fibrillation.  3. Obstructive sleep apnea.  4. 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  5. Status post cholecystectomy January 2008.  6. History of pancreatitis.  7. Ulcerative colitis with frequent diarrhea.   ALLERGIES:  LATEX AND CODEINE.   PROCEDURE:  None.   HISTORY OF PRESENT ILLNESS:  A 65 year old married Caucasian female with  recent history of intraseptal ST elevation MI in April 2008, with  unsuccessful attempt at crossing the lesion within the first diagonal.  She was subsequently medically managed.  Since the discharge, she has  had some fatigue, but otherwise has done well without chest pain or  shortness of breath until February 06, 2007, while sitting on her couch she  developed 1-2/10 midsternal spasm and tinge of pain without associated  symptoms lasting just a few seconds, resolving spontaneously.  Symptoms  recurred greater than 10 times during the day, prompting her to present  to the [facility] Emergency Department.  Notably symptoms did not occur  with exertion.   HOSPITAL COURSE:  The patient ruled out for MI and it was felt that her  chest pain was atypical and not cardiac in nature.  She also reported  frequent  diarrhea over the past year, as many as 12 times a day.  She  does have followup with Dr. Lina Sar in May; however, we felt that  she might benefit from sooner evaluation.  Therefore, we had the GI team  see her while in house.  They checked tissue transglutaminase antibody  as well as an IgA level to assess for celiac sprue and they will contact  the patient following discharge.  They felt that she was stable for  discharge today.   DISCHARGE LABORATORY DATA:  Hemoglobin 11.6, hematocrit 33.5, WBC 5.3,  MCV 88.8.  Sodium 134, potassium 4.8, chloride 108, CO2 22, BUN 15,  creatinine 1.38, glucose 99, total bilirubin 0.9, alkaline phosphatase  72, AST 41, ALT 19, albumin 2.9, troponin was negative x3, calcium 8.5.   DISPOSITION:  The patient is being discharged home today in good  condition.   FOLLOWUP/APPOINTMENT:  She will followup as previously scheduled with  Dr. Artist Pais, Dr. Excell Seltzer, and she will be contacted by [facility] for  followup.   DISCHARGE MEDICATIONS:  1. Benicar 20 mg daily.  2. Lexapro 10 mg daily.  3. Nexium 40 mg b.i.d.  4. Aspirin 81 mg daily.  5. Lopressor 25 mg b.i.d.  6. Plavix 75 mg daily.  7. Multivitamin 1 daily.  8. Glucosamine chondroitin 1 tab daily.  9. Nitroglycerin 0.4 mg sublingual p.r.n. chest pain.   OUTSTANDING LABS AND STUDIES:  None.   DURATION OF DISCHARGE:  40 minutes including physician time.      Nicolasa Ducking, ANP      Veverly Fells. Excell Seltzer, MD  Electronically Signed    CB/MEDQ  D:  02/07/2007  T:  02/08/2007  Job:  [id number]   cc:   Barbette Hair. Artist Pais, DO  Hedwig Morton. Juanda Chance, MD

## 2011-03-11 NOTE — H&P (Signed)
[name], [name]              ACCOUNT NO.:  [id number]   MEDICAL RECORD NO.:  [id number]          PATIENT TYPE:  INP   LOCATION:  6532                         FACILITY:  [facility]   PHYSICIAN:  Rollene Rotunda, MD, FACCDATE OF BIRTH:  Dec 24, 1945   DATE OF ADMISSION:  02/06/2007  DATE OF DISCHARGE:                              HISTORY & PHYSICAL   PRIMARY CARDIOLOGIST:  Dr. Tonny Bollman.   PRIMARY CARE PHYSICIAN:  Dr. Thomos Lemons.   PRIMARY GI:  Patient has pending visit with Dr. Lina Sar.   PATIENT PROFILE:  A 65 year old Caucasian female with a recent history  of anterior ST-segment-elevation MI and occluded first diagonal who  presents today with recurrent chest pain.   PROBLEM LIST:  1. Chest pain/coronary artery disease.      a.     Status post acute anterior ST-elevation-myocardial       infarction, January 29, 2007.      b.     Cardiac catheterization, January 29, 2007.  Ejection fraction       45%.  Left main normal.  Left anterior descending nonobstructive       degrees proximally.  V1 100%.  The left circumflex normal.  Right       coronary artery was dominant and normal.  Percutaneous coronary       intervention was attempted at the occluded V1; however, the       intervention team was unable to cross the lesion with the wire and       it was managed medically.  2. History of paroxysmal atrial fibrillation.  3. Obstructive sleep apnea.  4. Status post right nephrectomy in January of 2008.      a.     Patient says that in followup pathology was negative and she       is not sure of why it was required that the kidney come out, but       was told that she had an infection.  5. Status post cholecystectomy, January of 2008.  6. History of pancreatitis.  7. Ulcerative colitis since age 65.   HISTORY OF PRESENT ILLNESS:  [age]-old Caucasian female with a  recent history of anteroseptal ST elevation October 30, 2006 with  catheterization revealing occluded V1,  which was unable to  crossed/intervened upon and, therefore, was medically managed.  At  discharge, she had some fatigue, but otherwise has done well and has  been walking twice daily.  She is now up to 8 minutes twice a day, just  doing laps around her house.  She has not had any chest pain or  shortness of breath until today when sitting on the couch, she developed  1-2/10 midsternal spasm and a twinge of pain without associated  symptoms and lasted just a few seconds and resolved spontaneously.  Symptoms have occurred more than 10 times today, although she did her 8  minute walk this morning without any chest pain.  She appeared to feel  slightly worse with dyspnea when walking today thought.  For these  reasons, she presented to the [facility] ED  for evaluation.  Her ECG  shows no acute changes and cardiac markers are pending.  She is  currently pain free.   Patient also reports a long history of ulcerative colitis with, at  least, a 1 year history of daily diarrhea, up to 12 stools a day.  She  also has been experiencing, she feels like is worsening, anorexia over  the past couple of months.  She had previously been treated with  sulfasalazine; however, due to concerns that the medication may be  causing some of her anorexia, she self discontinued the medication.  On  her last admission, we had set her up for outpatient GI follow up with  Dr. Lina Sar and that is not scheduled until May unfortunately.   ALLERGIES:  1. LATEX.  2. CODEINE.   HOME MEDICATIONS:  1. Aspirin 325 mg daily.  2. Metoprolol 25 mg b.i.d.  3. Benicar 20 mg daily.  4. Lexapro 10 mg daily.  5. Lipitor 40 mg q.h.s.  6. Nexium 40 mg daily.  7. Plavix 75 mg daily.  8. Nitroglycerin 0.4 mg sublingual p.r.n. chest pain.   FAMILY HISTORY:  Mother is alive at age 75 with a history of MI and CVA.  Father died at age 55, there was a question of CHF.  She has 1 brother  and 2 sisters.  Her brother and 1  sister each had a stroke.   SOCIAL HISTORY:  She lives in [place] with her husband.  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  She  has 3 grown children.  She denies any tobacco, alcohol or drug use  currently or previously.  She is walking 8 minutes twice a day, plans to  increase this over time status post MI.   REVIEW OF SYSTEMS:  Positive for fatigue since her discharge, chronic  hearing loss, chest pain per HPI, nausea and diarrhea as outlined in the  HPI and anorexia as outlined in the HPI.  Her nausea, diarrhea and  anorexia are chronic and somewhat stable.  All other systems reviewed  and negative.   PHYSICAL EXAMINATION:  VITAL SIGNS:  Temperature 97.8.  Heart rate 73.  Respirations 20.  Blood pressure 114/67.  Pulse oximetry 97% on room  air.  GENERAL:  Pleasant white female in no acute distress.  Awake, alert and  oriented x3.  NECK:  No bruits or JVD.  LUNGS:  Respirations regular and unlabored.  Clear to auscultation.  CARDIAC:  Regular S1, S2.  No S3, S4 or murmurs.  ABDOMEN:  Round, soft, nontender, nondistended.  Bowel sounds x4.  EXTREMITIES:  Warm, dry and pink.  No clubbing, cyanosis or edema.  Dorsalis pedis and posterior tibial pulses 2+ and equal bilaterally.  The right groin site was clear of bleeding, bruises or hematoma.   Chest x-ray is pending.  EKG showed sinus rhythm with a normal axis.  No  acute ST/T changes at a rate of 65 beats per minute.  Lab work was  pending.   ASSESSMENT/PLAN:  1. Chest pain/coronary artery disease.  Atypical, brief/bleeding.  She      has had recurrent symptoms without associated symptoms.  There was      no recurrent symptoms with exertion, although she did feel more      short of breath while walking today.  ECG is without acute changes.      We will plan to admit/observe her over night and  cycle cardiac     enzymes.  Continue aspirin, statin, beta-blocker, Plavix and ARB.      If  enzymes are negative, we will discharge in the a.m.  2. Irritable bowel syndrome/diarrhea.  This was diagnosed in her late      45s.  She now has multiple loose stools daily for the past year      with decreasing appetite over, at least, the past month, if not      longer.  She will has outpatient GI follow up with Dr. Lina Sar      in May.  She would likely benefit from either an inpatient      evaluation, at least for some medical management diarrhea and      ulcerative colitis or sooner outpatient GI followup.  Notably, she      had previously been on sulfasalazine, which she self discontinued      secondary to concerns regarding anorexia.  3. Hyperlipidemia.  LDL was 133 on April 8.  She was started on      Lipitor therapy at that time.  We will followup LFTs.  4. Paroxysmal atrial fibrillation.  Not currently an issue.  She is      treated with beta-blocker therapy.  She is not on Coumadin.      Apparently paroxysmal atrial fibrillation occurred preoperatively      during a cholecystectomy in January of 2008 in [place].  5. Obstructive sleep apnea.  She has been fitted for CPAP, although      has not received her machine yet.      Nicolasa Ducking, ANP      Rollene Rotunda, MD, [facility]  Electronically Signed    CB/MEDQ  D:  02/06/2007  T:  02/06/2007  Job:  [id number]

## 2011-03-11 NOTE — H&P (Signed)
NAMESHANDORA, KOOGLER              ACCOUNT NO.:  [id number]   MEDICAL RECORD NO.:  [id number]          PATIENT TYPE:  INP   LOCATION:  2807                         FACILITY:  [facility]   PHYSICIAN:  Gerrit Friends. Dietrich Pates, MD, FACCDATE OF BIRTH:  1945/10/27   DATE OF ADMISSION:  01/29/2007  DATE OF DISCHARGE:                              HISTORY & PHYSICAL   REFERRING PHYSICIAN:  Dr. Lynelle Doctor   PRIMARY CARE PHYSICIAN:  Dr. Quintella Reichert in [facility].   PRIMARY CARDIOLOGIST:  [facility]   HISTORY OF PRESENT ILLNESS:  This 65 year old woman presented to the  emergency department with the sudden onset of nausea and emesis, mild  left upper chest pressure, questionable dyspnea, and marked EKG  abnormalities.  Ms. Servantes only cardiac history involves an episode  of paroxysmal atrial fibrillation, managed by the cardiology group in  [place].  She has had no prior chest discomfort.  Cardiovascular risk  factors include primarily hypertension.  She has no known  hyperlipidemia.  She has not had diabetes.  She recently was diagnosed  with sleep apnea; and was being fitted with a positive pressure device  when she developed sudden onset of nausea with emesis.  There was some  associated left upper chest discomfort.  She is somewhat diffuse in her  history, and details are lacking.   She was transported to [facility] where sublingual  nitroglycerin was given with questionable effect.  She has some residual  chest discomfort.  EKG showed evidence for acute anteroseptal myocardial  infarction.  A prior EKG from January2008 was obtained from her primary  care physician and was essentially normal.  Accordingly, urgent cardiac  catheterization was recommended.  The patient was concerned due to the  fact that her mother suffered a CVA subsequent to catheterization.  After a full discussion of the risks and potential benefits, she agreed  to proceed.   The remainder of the history was  abbreviated due to the urgency of her  condition.  In January she underwent a nephrectomy and cholecystectomy,  apparently as a single procedure.  She has a prior history of  pancreatitis and a long history of ulcerative colitis.  She was recently  found to have sleep apnea as noted above.   ALLERGIES:  She reports allergies to LATEX and CODEINE.   CURRENT MEDICATIONS:  Verapamil, Benicar, Nexium, and Lexapro.   PHYSICAL EXAMINATION:  GENERAL:  On exam, a healthy-appearing woman in  mild distress.  VITAL SIGNS:  The blood pressure is 145/80, heart rate 94 and regular,  respirations 24, O2 saturation 100%.  H EENT:  Anicteric sclerae; normal oral mucosa.  NECK:  No jugular venous distension; no carotid bruits.  SKIN:  No significant lesions.  NEUROMUSCULAR:  Symmetric strength and tone.  LUNGS:  Minimal basilar rales.  CARDIAC:  Normal first and second heart sounds; minimal systolic  ejection murmur.  ABDOMEN:  Soft and nontender; reduced bowel sounds; no masses; no  organomegaly; aortic pulsation not palpable.  EXTREMITIES:  No edema; normal distal pulses; no arterial bruits.   LABORATORY AND CHEST X-RAY:  Pending.  IMPRESSION:  Ms. Spano presents with not terribly impressive  symptoms, but a very impressive EKG.  With a normal EKG 2 months ago,  the concern for acute anteroseptal infarction is high and justifies  urgent coronary angiography.  The case was discussed with Dr. Excell Seltzer,  who will perform the procedure.   The patient took 162 mg of aspirin this morning.  Additional aspirin was  administered in the emergency department as well as 5 mg of intravenous  metoprolol and 4 mg of ondansetron.      Gerrit Friends. Dietrich Pates, MD, [facility]  Electronically Signed     RMR/MEDQ  D:  01/29/2007  T:  01/29/2007  Job:  [id number]

## 2011-03-11 NOTE — Discharge Summary (Signed)
[name], [name]              ACCOUNT NO.:  [id number]   MEDICAL RECORD NO.:  [id number]          PATIENT TYPE:  INP   LOCATION:  2013                         FACILITY:  [facility]   PHYSICIAN:  Veverly Fells. Excell Seltzer, MD  DATE OF BIRTH:  02/05/1946   DATE OF ADMISSION:  01/29/2007  DATE OF DISCHARGE:  01/31/2007                               DISCHARGE SUMMARY   CARDIOLOGIST:  Dr. Tonny Bollman.   PRIMARY CARE PHYSICIAN:  Patient will be establishing care with Dr.  Thomos Lemons, with [facility].  Previous primary care  physician was Dr. Quintella Reichert at [facility].  Previous cardiologist was [facility].   DISCHARGE DIAGNOSES:  1. Status post acute myocardial infarction/coronary artery disease.  2. Status post catheterization this admission, secondary to first      diagonal branch occlusion.  Patient with non-obstructive LAD      disease with a normal right coronary artery and left circumflex,      and mild segmental left ventricular systolic dysfunction, status      post attempted PCI of the first diagonal, but not able to cross the      lesion with a guidewire.  Patient to be treated medically.   PAST MEDICAL HISTORY:  1. Paroxysmal atrial fibrillation previously managed by cardiology      group in [place].  2. Recent diagnoses of sleep apnea, in the process of being fitted      with a CPAP device, status post nephrectomy, status post      cholecystectomy, prior history of pancreatitis, long history of      ulcerative colitis.   HOSPITAL COURSE:  [name] is a 65 year old Caucasian female who  presented to the emergency room with sudden onset of nausea and  vomiting, chest discomfort with marked ST  abnormality.  No prior  history of coronary artery disease known.  She also has a history of  hypertension, recent diagnosis of sleep apnea.  In the ER, patient was  treated with sublingual nitroglycerin.  EKG showed evidence for acute  anterior septal  myocardial infarction.  Urgent cardiac catheterization  was recommended.  Patient agreed to proceed with evaluation.  She was  treated with aspirin, metoprolol and Zofran.  In the cath lab, Dr.  Excell Seltzer attempted a PCI of the first diagonal but was unable to cross the  lesion with a guide wire, with plans to be treated medically.  Patient  tolerated the procedure without complications, left ventricular ejection  fraction estimated at 45%.  Post cath, patient stable, cardiac rehab  education initiated.  Patient transferred to telemetry unit the  following day.  Fasting lipid panel showed a total cholesterol of 170,  triglyceride 76, HDL 22 and an LDL of 133.  Patient was started on  Lipitor 80 mg daily.  She remained normal sinus rhythm.  Dr. Excell Seltzer in  to see patient on day of discharge.  Cath site stable.  Afebrile, heart  rate 67, blood pressure 123/64.  No further complaints of discomfort.  EKG showed normal sinus rhythm with mild QT prolongation.  Patient being  discharged  home, wishes to establish care with our internal medicine  group.  I have scheduled her initial evaluation with Dr. Artist Pais for May 8  at 8:30, schedule her followup appointment with Dr. Excell Seltzer April 30 at  3:00 p.m.  She has been given the post-cardiac catheterization discharge  instructions, along with the post-myocardial infarction contract  information.   MEDICATIONS:  At discharge include:  1. Aspirin 325 mg daily.  2. Metoprolol 25 b.i.d.  3. Benicar 20 mg daily.  4. Lexapro 10 mg daily.  5. Atorvastatin 40 mg daily.  6. Nexium 40 mg daily.  7. Plavix 75 mg daily.  8. She can continue her Glucosamine as previously taken.  Discontinue      her verapamil.  She understands to call our office, if she has any      problems from her cath site.  I have also given her a prescription      for nitroglycerin to use as needed.   Duration of discharge encounter is 45 minutes.      Dorian Pod, ACNP       Veverly Fells. Excell Seltzer, MD  Electronically Signed    MB/MEDQ  D:  01/31/2007  T:  01/31/2007  Job:  [id number]   cc:   Barbette Hair. Artist Pais, DO

## 2011-03-14 ENCOUNTER — Other Ambulatory Visit: Payer: Self-pay | Admitting: Cardiology

## 2011-04-16 ENCOUNTER — Other Ambulatory Visit: Payer: Self-pay | Admitting: Cardiology

## 2011-05-30 ENCOUNTER — Ambulatory Visit: Payer: Medicare Other | Admitting: Family

## 2011-05-30 ENCOUNTER — Encounter: Payer: Self-pay | Admitting: Cardiology

## 2011-06-22 ENCOUNTER — Telehealth: Payer: Self-pay | Admitting: Cardiology

## 2011-06-22 NOTE — Telephone Encounter (Signed)
Pt calling to see if she can come in to see Dr. Jens Som sooner than her Sept 5th, 2012 appt. Pt said she went into a-fib last night around 4am and normally she goes back into normal rhythm but she is still a-fib. Please return pt call to discuss.

## 2011-06-22 NOTE — Telephone Encounter (Signed)
Schedule fuov Brian Crenshaw  

## 2011-06-22 NOTE — Telephone Encounter (Signed)
Spoke with pt, she woke at 4am this morning in a sweat and she was back in atrial fib. She cont to be out of rhythm now. She denies cp or sob. She has an eye infection and is currently taking antibiotics. She also is having pain in the back of her head and down the back of her neck that she attributes to the infection. She does nor feel her heart racing but has not checked her pulse. Her meds were confirmed. Will discuss with dr Jens Som [name]

## 2011-06-23 ENCOUNTER — Inpatient Hospital Stay (HOSPITAL_COMMUNITY)
Admission: EM | Admit: 2011-06-23 | Discharge: 2011-06-27 | DRG: 309 | Disposition: A | Discharge status: Home / Self Care | Payer: Medicare Other | Source: Ambulatory Visit | Attending: Cardiology | Admitting: Cardiology

## 2011-06-23 DIAGNOSIS — F329 Major depressive disorder, single episode, unspecified: Secondary | ICD-10-CM | POA: Diagnosis present

## 2011-06-23 DIAGNOSIS — I2510 Atherosclerotic heart disease of native coronary artery without angina pectoris: Secondary | ICD-10-CM | POA: Diagnosis present

## 2011-06-23 DIAGNOSIS — G4733 Obstructive sleep apnea (adult) (pediatric): Secondary | ICD-10-CM | POA: Diagnosis present

## 2011-06-23 DIAGNOSIS — Z7982 Long term (current) use of aspirin: Secondary | ICD-10-CM

## 2011-06-23 DIAGNOSIS — I10 Essential (primary) hypertension: Secondary | ICD-10-CM | POA: Diagnosis present

## 2011-06-23 DIAGNOSIS — Z79899 Other long term (current) drug therapy: Secondary | ICD-10-CM

## 2011-06-23 DIAGNOSIS — F3289 Other specified depressive episodes: Secondary | ICD-10-CM | POA: Diagnosis present

## 2011-06-23 DIAGNOSIS — K5190 Ulcerative colitis, unspecified, without complications: Secondary | ICD-10-CM | POA: Diagnosis present

## 2011-06-23 DIAGNOSIS — I4891 Unspecified atrial fibrillation: Principal | ICD-10-CM | POA: Diagnosis present

## 2011-06-23 LAB — CBC
HCT: 44.6 % (ref 36.0–46.0)
Hemoglobin: 14.9 g/dL (ref 12.0–15.0)
MCH: 31 pg (ref 26.0–34.0)
MCHC: 33.4 g/dL (ref 30.0–36.0)
MCV: 92.7 fL (ref 78.0–100.0)

## 2011-06-23 LAB — COMPREHENSIVE METABOLIC PANEL
AST: 21 U/L (ref 0–37)
BUN: 21 mg/dL (ref 6–23)
CO2: 28 mEq/L (ref 19–32)
Chloride: 103 mEq/L (ref 96–112)
Creatinine, Ser: 1.17 mg/dL — ABNORMAL HIGH (ref 0.50–1.10)
GFR calc Af Amer: 56 mL/min — ABNORMAL LOW (ref >60)
GFR calc non Af Amer: 46 mL/min — ABNORMAL LOW (ref >60)
Glucose, Bld: 98 mg/dL (ref 70–99)
Total Bilirubin: 0.4 mg/dL (ref 0.3–1.2)

## 2011-06-23 LAB — PROTIME-INR
INR: 0.97 (ref 0.00–1.49)
Prothrombin Time: 13.1 seconds (ref 11.6–15.2)

## 2011-06-23 LAB — CARDIAC PANEL(CRET KIN+CKTOT+MB+TROPI): Relative Index: INVALID (ref 0.0–2.5)

## 2011-06-23 LAB — DIFFERENTIAL
Eosinophils Relative: 2 % (ref 0–5)
Lymphocytes Relative: 28 % (ref 12–46)
Monocytes Absolute: 0.7 10*3/uL (ref 0.1–1.0)
Monocytes Relative: 7 % (ref 3–12)
Neutro Abs: 6.5 10*3/uL (ref 1.7–7.7)

## 2011-06-23 NOTE — Telephone Encounter (Signed)
Pt calling back to see what crenshaw has said to do, wanted to let us know she still in a-fib, sweating, no sob or chest pain

## 2011-06-24 ENCOUNTER — Inpatient Hospital Stay (HOSPITAL_COMMUNITY): Payer: Medicare Other

## 2011-06-24 ENCOUNTER — Telehealth: Payer: Self-pay | Admitting: Cardiology

## 2011-06-24 LAB — CARDIAC PANEL(CRET KIN+CKTOT+MB+TROPI)
CK, MB: 2.4 ng/mL (ref 0.3–4.0)
Total CK: 35 U/L (ref 7–177)

## 2011-06-24 LAB — HEPARIN LEVEL (UNFRACTIONATED): Heparin Unfractionated: 0.35 IU/mL (ref 0.30–0.70)

## 2011-06-24 LAB — CBC
MCV: 92.8 fL (ref 78.0–100.0)
Platelets: 242 10*3/uL (ref 150–400)
RBC: 4.58 MIL/uL (ref 3.87–5.11)
WBC: 9.6 10*3/uL (ref 4.0–10.5)

## 2011-06-24 LAB — BASIC METABOLIC PANEL
CO2: 26 mEq/L (ref 19–32)
Calcium: 9.7 mg/dL (ref 8.4–10.5)
GFR calc non Af Amer: 45 mL/min — ABNORMAL LOW (ref >60)
Sodium: 139 mEq/L (ref 135–145)

## 2011-06-24 LAB — CK TOTAL AND CKMB (NOT AT ARMC): Total CK: 37 U/L (ref 7–177)

## 2011-06-24 LAB — PROTIME-INR: INR: 1.14 (ref 0.00–1.49)

## 2011-06-24 LAB — TROPONIN I: Troponin I: <0.3 ng/mL (ref <0.30)

## 2011-06-24 NOTE — Telephone Encounter (Signed)
Attempted to call patient phone sound busy.

## 2011-06-24 NOTE — Telephone Encounter (Signed)
Pt in [place] has questions re medication

## 2011-06-25 LAB — BASIC METABOLIC PANEL
BUN: 27 mg/dL — ABNORMAL HIGH (ref 6–23)
CO2: 27 mEq/L (ref 19–32)
Chloride: 105 mEq/L (ref 96–112)
GFR calc Af Amer: 48 mL/min — ABNORMAL LOW (ref >60)
Potassium: 4.1 mEq/L (ref 3.5–5.1)

## 2011-06-25 LAB — CBC
HCT: 40 % (ref 36.0–46.0)
Platelets: 199 10*3/uL (ref 150–400)
RBC: 4.29 MIL/uL (ref 3.87–5.11)
RDW: 13.7 % (ref 11.5–15.5)
WBC: 7.9 10*3/uL (ref 4.0–10.5)

## 2011-06-25 LAB — HEPARIN LEVEL (UNFRACTIONATED): Heparin Unfractionated: 0.43 IU/mL (ref 0.30–0.70)

## 2011-06-25 LAB — PROTIME-INR: INR: 1.3 (ref 0.00–1.49)

## 2011-06-25 LAB — MAGNESIUM: Magnesium: 2 mg/dL (ref 1.5–2.5)

## 2011-06-26 LAB — CBC
MCHC: 32.2 g/dL (ref 30.0–36.0)
Platelets: 216 10*3/uL (ref 150–400)
RDW: 13.6 % (ref 11.5–15.5)

## 2011-06-26 LAB — BASIC METABOLIC PANEL
BUN: 28 mg/dL — ABNORMAL HIGH (ref 6–23)
Calcium: 9.5 mg/dL (ref 8.4–10.5)
Creatinine, Ser: 1.2 mg/dL — ABNORMAL HIGH (ref 0.50–1.10)
GFR calc Af Amer: 55 mL/min — ABNORMAL LOW (ref >60)
GFR calc non Af Amer: 45 mL/min — ABNORMAL LOW (ref >60)

## 2011-06-26 LAB — HEPARIN LEVEL (UNFRACTIONATED): Heparin Unfractionated: <0.1 IU/mL — ABNORMAL LOW (ref 0.30–0.70)

## 2011-06-26 LAB — PROTIME-INR: Prothrombin Time: 15.8 seconds — ABNORMAL HIGH (ref 11.6–15.2)

## 2011-06-27 LAB — CBC
HCT: 37.7 % (ref 36.0–46.0)
MCHC: 32.9 g/dL (ref 30.0–36.0)
Platelets: 195 10*3/uL (ref 150–400)
RDW: 13.4 % (ref 11.5–15.5)

## 2011-06-27 LAB — BASIC METABOLIC PANEL
CO2: 25 mEq/L (ref 19–32)
Calcium: 9.3 mg/dL (ref 8.4–10.5)
Chloride: 106 mEq/L (ref 96–112)
Creatinine, Ser: 1.01 mg/dL (ref 0.50–1.10)
Glucose, Bld: 93 mg/dL (ref 70–99)

## 2011-06-28 ENCOUNTER — Telehealth: Payer: Self-pay | Admitting: Cardiology

## 2011-06-28 NOTE — Telephone Encounter (Signed)
See other phone note Song Myre  

## 2011-06-28 NOTE — Telephone Encounter (Signed)
Spoke with pt, she is unable to afford the tikosyn and requested xarelto samples. Information given to pt about connection to care and she is going on-line to start the application process. 7 day supply of tikosyn given as well as xarelto samples. Savings cards also given to pt [name]

## 2011-06-28 NOTE — Telephone Encounter (Signed)
Pt calling having trouble getting refills of tykosin and/or medications pt was prescribed at d/c from hospital. Please return pt call to clarify. Pt said she might be confused, [facility] and [facility] told [facility] they could not [facility] them, one needed a prior authorization and she doesn't know why they couldn't fill the other one.

## 2011-06-29 ENCOUNTER — Ambulatory Visit: Payer: Medicare Other | Admitting: Cardiology

## 2011-07-04 ENCOUNTER — Telehealth: Payer: Self-pay | Admitting: Cardiology

## 2011-07-04 NOTE — Telephone Encounter (Signed)
Patient calling try to filled out form- for free/ reduce medication. Patient was stop due to invalid amount. Any suggests.

## 2011-07-04 NOTE — Telephone Encounter (Signed)
SPOKE WITH PT  PT ATTEMPTED TO GET ASSISTANCE WITH TIKOSYN WAS TOLD MADE TO [facility]   UNABLE TO AFFORD IS IN DOUGHNUT HOLE  TIKOSYN WILL RUN $770.00 PER MONTH IS THERE AN ALTERNATE MED SHE CAN TAKE . PT AWARE WILL DISCUSS WITH  DR Jens Som Zack Seal

## 2011-07-04 NOTE — Telephone Encounter (Signed)
Tikosyn best option [facility]

## 2011-07-04 NOTE — Discharge Summary (Signed)
[name], [name]              ACCOUNT NO.:  [id number]  MEDICAL RECORD NO.:  [id number]  LOCATION:  2010                         FACILITY:  [facility]  PHYSICIAN:  Pricilla Riffle, MD, FACCDATE OF BIRTH:  12/07/1945  DATE OF ADMISSION:  06/23/2011 DATE OF DISCHARGE:  06/27/2011                              DISCHARGE SUMMARY   PRIMARY CARDIOLOGIST:  Madolyn Frieze. Jens Som, MD, [facility]  PRIMARY CARE PROVIDER:  Barbette Hair. Artist Pais, DO  DISCHARGE DIAGNOSES: 1. Paroxysmal atrial fibrillation, Tikosyn initiated this admission.     a.     The patient in normal sinus rhythm at discharge.     b.     Xarelto started this admission. 2. Coronary artery disease, status post cardiac catheterization in May     2008, nonobstructive disease on medical therapy recommendations. 3. Crohn disease. 4. Ulcerative colitis. 5. Hypertension.  SECONDARY DIAGNOSES: 1. Obstructive sleep apnea. 2. Depression. 3. Pancreatitis.  ALLERGIES AND INTOLERANCES:  MUCINEX, LATEX, SULFA, CODEINE and ASACOL.  HOSPITAL COURSE:  This is a 65 year old Caucasian female with the above-stated problem list who has had paroxysmal atrial fibrillation that has been self limiting.  The patient has been intolerance to Amiodarone in the past.  The patient was awoken from sleep secondary to her atrial fibrillation.  She felt palpitations and diaphoresis.  She denies presyncope or syncope.  She also had chest heaviness and mild arm discomfort.  EKG showed atrial fibrillation at a rate of 111 beats per minute.  The patient was placed on heparin as it was felt the patient's atrial fibrillation being greater than 48 hours.  She was also initiated on Coumadin.  It was decided the patient would proceed with TEE cardioversion with addition of a Tikosyn after discussion with Dr. Jens Som.  On August 31st, the patient converted to spontaneous sinus rhythm prior to cardioversion.  It was noted that the patient has had supratherapeutic INR on a very  low dose Coumadin, therefore, this has been discontinued and changed to Tikosyn.  The patient has been in normal sinus rhythm.  It was felt she should be initiated on Tikosyn.  The patient's magnesium level was 2.0 with a potassium of 4.0.  The patient's initial dose of Tikosyn was 250 mcg, QTc post initiation were 5 of 5 milliseconds.  Dr. Graciela Husbands increase the patient's Tikosyn dose to 375 mcg, she tolerated this well with followup QTcs of 480.  The patient remained in sinus rhythm during this time. Again, the patient was stopped off the Coumadin and Xarelto will started 48 hours post Coumadin discontinuation.  The patient ruled out for myocardial infarction during admission.  On day of discharge, Dr. Tenny Craw saw and evaluated the patient noted her stable for home.  She will be discharged after her last Tikosyn dose this afternoon with repeat EKG.  The patient has also been told that she can go back to our office and pick up pamphlets of the  Xarelto prior to picking up her prescription to ensure that she tolerates this well.  DISCHARGE LABS:  Sodium 140, potassium 4, BUN 26, creatinine 1.01.  WBC 7.4, hemoglobin 12.4, hematocrit 37.7, platelet 195.  DISCHARGE MEDICATIONS: 1. Dofetilide 125 mcg 3 tablets  every 12 hours. 2. Rivaroxaban  20 mg 1 tablet by mouth daily. 3. Glucosamine HCL 1 tablet every evening. 4. Hydrocodone/chlorpheniramine oral suspension 10 mg/8 mg 1 teaspoon     every 12 hours as needed. 5. Lexapro 10 mg daily. 6. Metoprolol tartrate 25 mg 1 tablet twice daily. 7. Multivitamin daily. 8. Nexium 40 mg 1 capsule twice daily. 9. Please stop taking diltiazem and aspirin.  FOLLOWUP PLANS AND INSTRUCTIONS: 1. The patient will follow up with Dr. Jens Som in 2-3 weeks, the     office will call with scheduled appointment. 2. The patient should increase activity as tolerated. 3. The patient to call the office in the interim for any problems or     concerns.  DURATION OF  DISCHARGE:  Greater than 30 minutes with physician and physician's extender time.     Leonette Monarch, PA-C   ______________________________ Pricilla Riffle, MD, [facility]    NB/MEDQ  D:  06/27/2011  T:  06/27/2011  Job:  [id number]  cc:   Barbette Hair. Artist Pais, DO Madolyn Frieze. Jens Som, MD, [facility]  Electronically Signed by Alen Blew P.A. on 06/29/2011 11:36:39 AM Electronically Signed by Dietrich Pates MD FACC on 07/04/2011 06:10:08 AM

## 2011-07-05 NOTE — Telephone Encounter (Signed)
PT AWARE WILL TRY TO TAKE TIKOSYN./CY

## 2011-07-07 ENCOUNTER — Telehealth: Payer: Self-pay | Admitting: Internal Medicine

## 2011-07-07 NOTE — Telephone Encounter (Signed)
Pt calling to state that she recently learned that the standard dosage of xarelto in 10 mg and pt is taking double that, 20 mg. Pt has had  Difficulty bleeding in the past being dx with ulcer colitis. Pt wants to know if her records are available to all the MD. Pt just wanted to make sure everyone was aware of pt past medical history.

## 2011-07-07 NOTE — Telephone Encounter (Signed)
This is a patient of Dr. Ludwig Clarks. I reviewed Xarelto with Kennon Rounds- Pharm D. Per Kennon Rounds, the 10mg  dose of Xarelto is used for DVT, but a-fib as an indication requires the 20mg  dose. I have left a message for the patient to call and ask for [name] today, or triage if calling back tomorrow.

## 2011-07-08 ENCOUNTER — Other Ambulatory Visit (INDEPENDENT_AMBULATORY_CARE_PROVIDER_SITE_OTHER): Payer: Medicare Other

## 2011-07-08 ENCOUNTER — Telehealth: Payer: Self-pay | Admitting: Cardiology

## 2011-07-08 ENCOUNTER — Ambulatory Visit (INDEPENDENT_AMBULATORY_CARE_PROVIDER_SITE_OTHER): Payer: Medicare Other | Admitting: Physician Assistant

## 2011-07-08 ENCOUNTER — Encounter: Payer: Self-pay | Admitting: Physician Assistant

## 2011-07-08 ENCOUNTER — Telehealth: Payer: Self-pay | Admitting: Internal Medicine

## 2011-07-08 VITALS — BP 150/80 | HR 52 | Ht 63.0 in | Wt 228.0 lb

## 2011-07-08 DIAGNOSIS — K5010 Crohn's disease of large intestine without complications: Secondary | ICD-10-CM

## 2011-07-08 DIAGNOSIS — K5790 Diverticulosis of intestine, part unspecified, without perforation or abscess without bleeding: Secondary | ICD-10-CM

## 2011-07-08 DIAGNOSIS — R1032 Left lower quadrant pain: Secondary | ICD-10-CM

## 2011-07-08 DIAGNOSIS — K5730 Diverticulosis of large intestine without perforation or abscess without bleeding: Secondary | ICD-10-CM

## 2011-07-08 LAB — CBC WITH DIFFERENTIAL/PLATELET
Basophils Relative: 0.4 % (ref 0.0–3.0)
Eosinophils Absolute: 0.2 10*3/uL (ref 0.0–0.7)
Eosinophils Relative: 2.1 % (ref 0.0–5.0)
HCT: 38.9 % (ref 36.0–46.0)
Lymphs Abs: 2.1 10*3/uL (ref 0.7–4.0)
MCHC: 33.2 g/dL (ref 30.0–36.0)
MCV: 93.9 fl (ref 78.0–100.0)
Monocytes Absolute: 0.5 10*3/uL (ref 0.1–1.0)
Neutrophils Relative %: 73.3 % (ref 43.0–77.0)
Platelets: 232 10*3/uL (ref 150.0–400.0)
RBC: 4.15 Mil/uL (ref 3.87–5.11)
WBC: 10.8 10*3/uL — ABNORMAL HIGH (ref 4.5–10.5)

## 2011-07-08 MED ORDER — DOXYCYCLINE HYCLATE 100 MG PO TABS: 100.0000 mg | ORAL_TABLET | Freq: Two times a day (BID) | ORAL | Status: AC

## 2011-07-08 NOTE — Telephone Encounter (Signed)
Pt calling to state that she just made appt w/ GI today at 2:30pm per suggestion from our office. Pt c/o pain in stomach following medication changes, from the very beginning. Pt would like this noted in her records/notes that she has pain in her stomach.

## 2011-07-08 NOTE — Telephone Encounter (Signed)
Check CBC [name]

## 2011-07-08 NOTE — Progress Notes (Signed)
Subjective:    Patient ID: [name], female    DOB: 1946-02-23, 65 y.o.   MRN: [id number]  HPI [name] is a 65 year old white female known to Dr. Leone Payor who has a diagnosis of Crohn's colitis. She also has diverticular disease. She has in the past been noncompliant with her medications. When last seen on the evening of February 2012 she had been off of early all the bowel is asymptomatic and was asked to restart it at 2.4 g daily. She says that she did take the Lialda for a few months after that but stopped it as she was not having any problems.  More recently she has had difficulty with atrial fibrillation and was hospitalized at the end of August and started Tikosyn and Xarelto.. She says she started having left lower quadrant pain around that same time about 2 weeks ago and has had ongoing daily pain since. She describes it as a  burning sensation. She is also having 4-5 bowel movements per day generally aggravated postprandially and has noted a very small amount of bright red blood on the tissue. She does note that her stool seemed to be a different color,somewhat  greenish. She says normal for her is to have 3 bowel movements per day. She's not had any fever or chillsno nausea or vomiting, but says generally she just does not feel well. She did restart her Lialda and is taking 2.4 g daily.    Review of Systems  Constitutional: Positive for fatigue.  HENT: Negative.   Eyes: Negative.   Respiratory: Negative.   Cardiovascular: Negative.   Gastrointestinal: Positive for abdominal pain, diarrhea and anal bleeding.  Genitourinary: Negative.   Musculoskeletal: Negative.   Skin: Negative.   Neurological: Negative.   Hematological: Negative.   Psychiatric/Behavioral: Negative.    Outpatient Prescriptions Prior to Visit  Medication Sig Dispense Refill  . chlorpheniramine (CHLOR-TRIMETON) 4 MG tablet Take 4 mg by mouth 2 (two) times daily as needed. 1 daily as needed        .  escitalopram (LEXAPRO) 10 MG tablet Take 1 tablet (10 mg total) by mouth daily. 1 daily   30 tablet  5  . esomeprazole (NEXIUM) 40 MG capsule Take 1 capsule (40 mg total) by mouth 2 (two) times daily.  180 capsule  2  . Glucosamine-Chondroitin (GLUCOSAMINE CHONDR COMPLEX PO) Take by mouth. 1 daily        . loperamide (IMODIUM A-D) 2 MG tablet Take 2 mg by mouth 4 (four) times daily as needed. Prn       . losartan (COZAAR) 25 MG tablet Take 1 tablet (25 mg total) by mouth daily. 1 by mouth 2 times daily   180 tablet  1  . mesalamine (LIALDA) 1.2 G EC tablet Take 1 tablet (1.2 g total) by mouth 2 (two) times daily.  60 tablet  30  . metoprolol tartrate (LOPRESSOR) 25 MG tablet Take 1 tablet (25 mg total) by mouth 2 (two) times daily.  180 tablet  1  . Multiple Vitamin (MULTIVITAMIN) capsule Take 1 capsule by mouth daily. 1 daily        . nitroGLYCERIN (NITROSTAT) 0.4 MG SL tablet Place 0.4 mg under the tongue every 5 (five) minutes as needed. 1 tab under tongue every 5 min for chest pain not to exceed 3 tabs in 15 min       . Probiotic Product (PHILLIPS COLON HEALTH) CAPS Take by mouth. 1 per day       .  aspirin 81 MG EC tablet Take 81 mg by mouth daily. 1 per day       . diltiazem (CARDIZEM CD) 120 MG 24 hr capsule TAKE ONE CAPSULE BY MOUTH TWICE A DAY  60 capsule  8  . diltiazem (TIAZAC) 120 MG 24 hr capsule Take 120 mg by mouth daily. 1 by mouth 2 times daily       . hydrochlorothiazide (HYDRODIURIL) 12.5 MG tablet 1/2 tab once daily  45 tablet  1   Allergies  Allergen Reactions  . Codeine   . Latex   . Mucilgen (Psyllium)   . Mucinex Nausea And Vomiting and Other (See Comments)    headaches       Objective:   Physical Exam Well-developed overweight white female in no acute distress, alert and oriented x3 Blood pressure 150/80 pulse 52, HEENT ;nonicteric normocephalic EOMI PERRLA sclera Anicteric, Neck; Supple no JVD, Cardiovascular; regular rate and rhythm with S1-S2 no murmur rub or  gallop Pulmonary; clear bilaterally, Abdomen; is soft, bowel sounds active, she is tender in the left lower quadrant left mid quadran,t there is no guarding, no rebound, no palpable mass or hepatosplenomegaly, she does have a prominent venous pattern across her lower abdomen, Rectal; not done extremities no clubbing cyanosis or edema skin warm and dry, Psych; mood and affect normal and appropriate.        Assessment & Plan:  # 65 year old female with 2 week history of left lower quadrant burning pain and looser stools. This is in the setting of known Crohn's colitis which has been quescent, and starting  new cardiac meds. Her exam is more consistent with diverticulitis, and am not convinced that she's having a flare of her colitis.  Plan; CBC andCMETtoday Start doxycycline 100 mg by mouth twice daily x10 days. Antibiotic choices are limited because of Tikosyn. Tylenol as needed for pain Low-residue diet over the weekend Patient to call on Monday [phone] with a progress report if she is not improved she will need CT scan of the abdomen and pelvis. Continue Lialda 2.4 g daily.  #2 History of Crohn's colitis  #3 atrial fibrillation  #4 coronary artery disease and  #5 status post right nephrectomy.

## 2011-07-08 NOTE — Telephone Encounter (Signed)
Patient states that she has LLQ pain and all over abdominal pain that started around the first of the month after starting a new medication tikosyn and xarelto.  She has a history of crohn's colitis.  She denies diarrhea or constipation, nausea or vomiting, rectal bleeding or fever.  She reports she stopped taking her lialda about 6 months ago because she has no symptoms.  I did stress to her that this is a life long disease and she needs to take her meds even if she is asymptomatic.  Patient will come in today at 2:30 to see [facility] PA.

## 2011-07-08 NOTE — Patient Instructions (Signed)
Please go to the basement level to have your labs drawn.  We sent a prescription for Doxycycline 100 mg. Take as directed. [facility] [place] [place]. Continue Lialda.  Stay on a low residue diet for now.  We have given you a brochure. Stop the popcorn.  Call us Monday with  a progress report. You can ask for [facility]. Call [phone].

## 2011-07-08 NOTE — Telephone Encounter (Signed)
I spoke with the patient and made her aware that Xarelto 20mg  daily is indicated for patients with a-fib. She is verbalizes understanding.

## 2011-07-08 NOTE — Telephone Encounter (Signed)
Will forward to [facility] and Dr. Jens Som.

## 2011-07-09 ENCOUNTER — Telehealth: Payer: Self-pay | Admitting: Physician Assistant

## 2011-07-09 NOTE — Telephone Encounter (Signed)
Pt called because she had been put on Doxycycline for diverticulitis, wanted to make sure it's OK to take w/ Tikosyn. Spoke w/ pharmacy, OK to take Doxy w/ Tikosyn. Called pt back and advised her OK to take Rx.

## 2011-07-11 ENCOUNTER — Telehealth: Payer: Self-pay | Admitting: *Deleted

## 2011-07-11 LAB — COMPREHENSIVE METABOLIC PANEL
AST: 23 U/L (ref 0–37)
Alkaline Phosphatase: 85 U/L (ref 39–117)
BUN: 19 mg/dL (ref 6–23)
Glucose, Bld: 92 mg/dL (ref 70–99)
Total Bilirubin: 0.6 mg/dL (ref 0.3–1.2)

## 2011-07-11 NOTE — Telephone Encounter (Signed)
Patient notified of results.

## 2011-07-11 NOTE — Telephone Encounter (Signed)
Spoke with [name], she was seen by GI on Friday and the blood work was normal. She was told by GI she has diverticulitis and chron's disease. She has a follow up with dr Jens Som this week [name]

## 2011-07-11 NOTE — Progress Notes (Signed)
Agree with initial assessment and plans. Amy to f/u with patient today

## 2011-07-11 NOTE — Telephone Encounter (Signed)
Message copied by Daphine Deutscher on Mon Jul 11, 2011  3:44 PM ------      Message from: [place], [place] S      Created: Mon Jul 11, 2011  3:35 PM       Please let Maryem know her labs are normal

## 2011-07-14 ENCOUNTER — Ambulatory Visit (INDEPENDENT_AMBULATORY_CARE_PROVIDER_SITE_OTHER): Payer: Medicare Other | Admitting: Cardiology

## 2011-07-14 ENCOUNTER — Encounter: Payer: Self-pay | Admitting: Cardiology

## 2011-07-14 DIAGNOSIS — I10 Essential (primary) hypertension: Secondary | ICD-10-CM

## 2011-07-14 DIAGNOSIS — E785 Hyperlipidemia, unspecified: Secondary | ICD-10-CM

## 2011-07-14 DIAGNOSIS — I4891 Unspecified atrial fibrillation: Secondary | ICD-10-CM

## 2011-07-14 DIAGNOSIS — I2510 Atherosclerotic heart disease of native coronary artery without angina pectoris: Secondary | ICD-10-CM

## 2011-07-14 NOTE — Assessment & Plan Note (Signed)
Continue present medications. No aspirin as she is on xeralto and has history of colitis. Intolerant to statins.

## 2011-07-14 NOTE — Progress Notes (Signed)
HPI: Pleasant female with past medical history of coronary artery disease and atrial fibrillation returns for followup. Last cardiac catheterization on Mar 06, 2007 showed an ejection fraction of 55%. There is nonobstructive plaque in the LAD, and circumflex and the right coronary artery was normal. 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 Echocardiogram in March of 2010 showed normal LV function, moderate biatrial enlargement and moderate tricuspid regurgitation. She has had a previous TEE guided cardioversion on Feb 27, 2009. Also note the patient is extremely sensitive to Coumadin.  Previous TSH is normal. Carotid Dopplers in February of 2011 showed 0-39% stenosis bilaterally. Patient admitted in August of 2012 with recurrent atrial fibrillation and converted to sinus. Tikosyn initiated. Gibson Ramp also initiated. Since then, she denies dyspnea on exertion, orthopnea, PND, pedal edema, bleeding, syncope or exertional chest pain. She has been diagnosed with diverticulitis and she was having abdominal pain.  Current Outpatient Prescriptions  Medication Sig Dispense Refill  . chlorpheniramine (CHLOR-TRIMETON) 4 MG tablet Take 4 mg by mouth 2 (two) times daily as needed. 1 daily as needed        . dofetilide (TIKOSYN) 125 MCG capsule Take 375 mcg by mouth 2 (two) times daily.        Marland Kitchen doxycycline (VIBRA-TABS) 100 MG tablet Take 1 tablet (100 mg total) by mouth 2 (two) times daily.  20 tablet  0  . escitalopram (LEXAPRO) 10 MG tablet Take 1 tablet (10 mg total) by mouth daily. 1 daily   30 tablet  5  . esomeprazole (NEXIUM) 40 MG capsule Take 1 capsule (40 mg total) by mouth 2 (two) times daily.  180 capsule  2  . Glucosamine-Chondroitin (GLUCOSAMINE CHONDR COMPLEX PO) Take by mouth. 1 daily        . loperamide (IMODIUM A-D) 2 MG tablet Take 2 mg by mouth 4 (four) times daily as needed. Prn       . losartan (COZAAR) 25 MG tablet Take 1 tablet (25 mg  total) by mouth daily. 1 by mouth 2 times daily   180 tablet  1  . mesalamine (LIALDA) 1.2 G EC tablet Take 1 tablet (1.2 g total) by mouth 2 (two) times daily.  60 tablet  30  . metoprolol tartrate (LOPRESSOR) 25 MG tablet Take 1 tablet (25 mg total) by mouth 2 (two) times daily.  180 tablet  1  . Multiple Vitamin (MULTIVITAMIN) capsule Take 1 capsule by mouth daily. 1 daily        . nitroGLYCERIN (NITROSTAT) 0.4 MG SL tablet Place 0.4 mg under the tongue every 5 (five) minutes as needed. 1 tab under tongue every 5 min for chest pain not to exceed 3 tabs in 15 min       . Probiotic Product (PHILLIPS COLON HEALTH) CAPS Take by mouth. 1 per day       . Rivaroxaban (XARELTO) 20 MG TABS Take 1 tablet by mouth daily.           Past Medical History  Diagnosis Date  . CAD (coronary artery disease)   . Depression   . GERD (gastroesophageal reflux disease)   . Hyperlipidemia   . Hypertension   . Crohn's colitis     she reports ulcerative colitis beginning in her 34's, biopsies 2008 suggest Crohn's not UC  . Pseudoaneurysm of right femoral artery   . Pancreatitis   . Chronic sinusitis   . Renal oncocytoma   . Myocardial infarction   .  Uveitis   . Anxiety   . Obesity   . Chronic cough   . OSA (obstructive sleep apnea)   . Paroxysmal atrial fibrillation   . History of colonoscopy     Past Surgical History  Procedure Date  . Nephrectomy 10/2006    right secondary to oncocytoma   . Breast biopsy 1996    benign lesion   . Cholecystectomy 10/2006  . Tubal ligation   . Right renal artery repair 1976  . Right femoral  pseudoaneurysm & right groin hematoma evacuation 02/2007    History   Social History  . Marital Status: Married    Spouse Name: N/A    Number of Children: 3  . Years of Education: N/A   Occupational History  .      retired   Social History Main Topics  . Smoking status: Never Smoker   . Smokeless tobacco: Never Used  . Alcohol Use: No  . Drug Use: No  .  Sexually Active: Not Currently   Other Topics Concern  . 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 Remaining systems are negative.  Physical Exam: Well-developed well-nourished in no acute distress.  Skin is warm and dry.  HEENT is normal.  Neck is supple. No thyromegaly.  Chest is clear to auscultation with normal expansion.  Cardiovascular exam is bradycardic Abdominal exam nontender or distended. No masses palpated. Extremities show no edema. neuro grossly intact  ECG marked sinus bradycardia at a rate of 48. No ST changes. Normal QTC.

## 2011-07-14 NOTE — Assessment & Plan Note (Signed)
Intolerant to statins. 

## 2011-07-14 NOTE — Patient Instructions (Signed)
Your physician recommends that you schedule a follow-up appointment in: 3 MONTHS  DECREASE METOPROLOL TO 50 MG TAKE 1/2 TABLET TWICE DAILY

## 2011-07-14 NOTE — Assessment & Plan Note (Signed)
Patient remains in sinus rhythm. Her heart rate is mildly reduced. Change Lopressor to 12.5 mg p.o. B.i.d. Continue tikosyn and xeralto. Watch closely for any evidence of bleeding given history of colitis. I am not convinced that tikosyn is causing abdominal pain. We will follow her on her present medications.

## 2011-07-14 NOTE — Assessment & Plan Note (Signed)
Blood pressure controlled. Continue present medications. 

## 2011-07-17 ENCOUNTER — Telehealth: Payer: Self-pay | Admitting: Physician Assistant

## 2011-07-17 NOTE — Telephone Encounter (Signed)
Pt called because her BP is 206/88. She feels funny in her throat but no dizziness/HA/blurred vision. Advised her to increase losartan 25mg  BID to - 2 tabs BID. Call 911 and go to ER if Sx worsen. O/W we will call for f/u.

## 2011-07-18 ENCOUNTER — Encounter (HOSPITAL_BASED_OUTPATIENT_CLINIC_OR_DEPARTMENT_OTHER): Payer: Self-pay | Admitting: Emergency Medicine

## 2011-07-18 ENCOUNTER — Emergency Department (HOSPITAL_BASED_OUTPATIENT_CLINIC_OR_DEPARTMENT_OTHER)
Admission: EM | Admit: 2011-07-18 | Discharge: 2011-07-18 | Disposition: A | Discharge status: Home / Self Care | Payer: Medicare Other | Attending: Emergency Medicine | Admitting: Emergency Medicine

## 2011-07-18 ENCOUNTER — Telehealth: Payer: Self-pay | Admitting: *Deleted

## 2011-07-18 ENCOUNTER — Emergency Department (INDEPENDENT_AMBULATORY_CARE_PROVIDER_SITE_OTHER): Payer: Medicare Other

## 2011-07-18 DIAGNOSIS — I10 Essential (primary) hypertension: Secondary | ICD-10-CM | POA: Insufficient documentation

## 2011-07-18 DIAGNOSIS — F3289 Other specified depressive episodes: Secondary | ICD-10-CM | POA: Insufficient documentation

## 2011-07-18 DIAGNOSIS — Z79899 Other long term (current) drug therapy: Secondary | ICD-10-CM | POA: Insufficient documentation

## 2011-07-18 DIAGNOSIS — R51 Headache: Secondary | ICD-10-CM

## 2011-07-18 DIAGNOSIS — I2510 Atherosclerotic heart disease of native coronary artery without angina pectoris: Secondary | ICD-10-CM | POA: Insufficient documentation

## 2011-07-18 DIAGNOSIS — K219 Gastro-esophageal reflux disease without esophagitis: Secondary | ICD-10-CM | POA: Insufficient documentation

## 2011-07-18 DIAGNOSIS — J3489 Other specified disorders of nose and nasal sinuses: Secondary | ICD-10-CM

## 2011-07-18 DIAGNOSIS — F329 Major depressive disorder, single episode, unspecified: Secondary | ICD-10-CM | POA: Insufficient documentation

## 2011-07-18 DIAGNOSIS — I252 Old myocardial infarction: Secondary | ICD-10-CM | POA: Insufficient documentation

## 2011-07-18 DIAGNOSIS — E785 Hyperlipidemia, unspecified: Secondary | ICD-10-CM | POA: Insufficient documentation

## 2011-07-18 LAB — URINE MICROSCOPIC-ADD ON

## 2011-07-18 LAB — URINALYSIS, ROUTINE W REFLEX MICROSCOPIC
Nitrite: NEGATIVE
Specific Gravity, Urine: 1.008 (ref 1.005–1.030)
pH: 6 (ref 5.0–8.0)

## 2011-07-18 LAB — BASIC METABOLIC PANEL
BUN: 20 mg/dL (ref 6–23)
Chloride: 105 mEq/L (ref 96–112)
GFR calc Af Amer: >60 mL/min (ref >60)
Potassium: 3.9 mEq/L (ref 3.5–5.1)
Sodium: 141 mEq/L (ref 135–145)

## 2011-07-18 LAB — CBC
HCT: 38.6 % (ref 36.0–46.0)
Platelets: 196 10*3/uL (ref 150–400)
RDW: 13.3 % (ref 11.5–15.5)
WBC: 8.3 10*3/uL (ref 4.0–10.5)

## 2011-07-18 MED ORDER — ACETAMINOPHEN 500 MG PO TABS
ORAL_TABLET | ORAL | Status: AC
  Administered 2011-07-18: 1000 mg
  Filled 2011-07-18: qty 2

## 2011-07-18 MED ORDER — AMLODIPINE BESYLATE 5 MG PO TABS
ORAL_TABLET | ORAL | Status: AC
  Administered 2011-07-18: 5 mg
  Filled 2011-07-18: qty 1

## 2011-07-18 MED ORDER — AMLODIPINE BESYLATE 2.5 MG PO TABS: 5.0000 mg | ORAL_TABLET | Freq: Every day | ORAL | Status: DC

## 2011-07-18 NOTE — ED Notes (Signed)
Pt CO Headache and Hypertension after decreasing Metoprolol as ordered

## 2011-07-18 NOTE — ED Notes (Signed)
Pt reports headache with hypertension 12hrs

## 2011-07-18 NOTE — Telephone Encounter (Signed)
Per r barrett--pt needs f/u appoint for elevated BP --[name] spoke to pt over weekend --pt c/o of elevated BP--I  Have called our sched. Department and asked them to call pt and make her an appoint with dr Sheran Lawless

## 2011-07-18 NOTE — ED Provider Notes (Signed)
History     CSN: [id number] Arrival date & time: 07/18/2011 12:37 AM  Chief Complaint  Patient presents with  . Hypertension    Pt reports headache and neck pain RT elevated BP    HPI  (Consider location/radiation/quality/duration/timing/severity/associated sxs/prior treatment)  Patient is a 65 y.o. female presenting with hypertension. The history is provided by the patient. No language interpreter was used.  Hypertension This is a new problem. The current episode started more than 2 days ago. The problem occurs constantly. The problem has been gradually worsening. Associated symptoms include headaches. Pertinent negatives include no chest pain, no abdominal pain and no shortness of breath. The symptoms are aggravated by nothing (not sudden onset, not the worst HA of her life, gets better when pressure gets better). The symptoms are relieved by medications. The treatment provided moderate relief.  No CP, DOE, SOB, no n/v/d.  No neck pain or stiffness. No vertigo, no weakness, no numbness no difficulty making or understanding speech.  Referred in by Theodore Demark for check d/t elevated BP  Past Medical History  Diagnosis Date  . CAD (coronary artery disease)   . Depression   . GERD (gastroesophageal reflux disease)   . Hyperlipidemia   . Hypertension   . Crohn's colitis     she reports ulcerative colitis beginning in her 56's, biopsies 2008 suggest Crohn's not UC  . Pseudoaneurysm of right femoral artery   . Pancreatitis   . Chronic sinusitis   . Renal oncocytoma   . Myocardial infarction   . Uveitis   . Anxiety   . Obesity   . Chronic cough   . OSA (obstructive sleep apnea)   . Paroxysmal atrial fibrillation   . History of colonoscopy     Past Surgical History  Procedure Date  . Nephrectomy 10/2006    right secondary to oncocytoma   . Breast biopsy 1996    benign lesion   . Cholecystectomy 10/2006  . Tubal ligation   . Right renal artery repair 1976  . Right femoral   pseudoaneurysm & right groin hematoma evacuation 02/2007    Family History  Problem Relation Age of Onset  . Colitis Father   . Hypertension Mother   . Coronary artery disease Mother   . Stroke Mother   . Stroke Brother   . Colon cancer Paternal Aunt   . Clotting disorder Mother   . Clotting disorder Brother   . Clotting disorder Sister   . Heart disease Mother   . Heart disease Father   . Asthma Daughter   . Allergies Mother   . Asthma Maternal Uncle     History  Substance Use Topics  . Smoking status: Never Smoker   . Smokeless tobacco: Never Used  . Alcohol Use: No    OB History    Grav Para Term Preterm Abortions TAB SAB Ect Mult Living                  Review of Systems  Review of Systems  Constitutional: Negative for activity change.  HENT: Negative for neck pain.   Eyes: Negative for discharge.  Respiratory: Negative for apnea and shortness of breath.   Cardiovascular: Negative for chest pain, palpitations and leg swelling.  Gastrointestinal: Negative for abdominal pain and abdominal distention.  Genitourinary: Negative for dysuria and difficulty urinating.  Musculoskeletal: Negative for back pain and arthralgias.  Neurological: Positive for headaches.  Hematological: Negative.   Psychiatric/Behavioral: Negative.     Allergies  Codeine;  Latex; Mucilgen; and Mucinex  Home Medications   Current Outpatient Rx  Name Route Sig Dispense Refill  . CHLORPHENIRAMINE MALEATE 4 MG PO TABS Oral Take 4 mg by mouth 2 (two) times daily as needed. 1 daily as needed      . DOFETILIDE 125 MCG PO CAPS Oral Take 375 mcg by mouth 2 (two) times daily.      Marland Kitchen DOXYCYCLINE HYCLATE 100 MG PO TABS Oral Take 1 tablet (100 mg total) by mouth 2 (two) times daily. 20 tablet 0  . ESCITALOPRAM OXALATE 10 MG PO TABS Oral Take 1 tablet (10 mg total) by mouth daily. 1 daily  30 tablet 5  . ESOMEPRAZOLE MAGNESIUM 40 MG PO CPDR Oral Take 1 capsule (40 mg total) by mouth 2 (two) times  daily. 180 capsule 2  . GLUCOSAMINE CHONDR COMPLEX PO Oral Take by mouth. 1 daily      . LOPERAMIDE HCL 2 MG PO TABS Oral Take 2 mg by mouth 4 (four) times daily as needed. Prn     . LOSARTAN POTASSIUM 25 MG PO TABS Oral Take 1 tablet (25 mg total) by mouth daily. 1 by mouth 2 times daily  180 tablet 1  . MESALAMINE 1.2 G PO TBEC Oral Take 1 tablet (1.2 g total) by mouth 2 (two) times daily. 60 tablet 30    Dispense as written.    Take 2 pills together daily  . METOPROLOL TARTRATE 25 MG PO TABS  TAKE 1/2 TABLET TWICE DAILY 180 tablet 1  . MULTIVITAMINS PO CAPS Oral Take 1 capsule by mouth daily. 1 daily      . NITROGLYCERIN 0.4 MG SL SUBL Sublingual Place 0.4 mg under the tongue every 5 (five) minutes as needed. 1 tab under tongue every 5 min for chest pain not to exceed 3 tabs in 15 min     . PHILLIPS COLON HEALTH PO CAPS Oral Take by mouth. 1 per day     . RIVAROXABAN 20 MG PO TABS Oral Take 1 tablet by mouth daily.        Physical Exam    BP 172/87  Pulse 51  Temp(Src) 97.7 F (36.5 C) (Oral)  Resp 18  SpO2 96%  Physical Exam  Constitutional: She is oriented to person, place, and time. She appears well-developed and well-nourished. No distress.  HENT:  Head: Normocephalic and atraumatic.  Right Ear: External ear normal.  Left Ear: External ear normal.  Mouth/Throat: Oropharynx is clear and moist.  Eyes: EOM are normal. Pupils are equal, round, and reactive to light. Right eye exhibits no discharge. Left eye exhibits no discharge.  Neck: Normal range of motion. Neck supple. No JVD present. No tracheal deviation present.  Cardiovascular: Normal rate and regular rhythm.   Pulmonary/Chest: Effort normal and breath sounds normal. No stridor. No respiratory distress.  Abdominal: Soft. Bowel sounds are normal. She exhibits no distension.  Musculoskeletal: Normal range of motion. She exhibits no edema.       5/5 x 4 extremities  Neurological: She is alert and oriented to person,  place, and time. She has normal reflexes. She displays normal reflexes. No cranial nerve deficit. She exhibits normal muscle tone. Coordination normal.  Skin: Skin is warm and dry. She is not diaphoretic.  Psychiatric: She has a normal mood and affect.    ED Course  Procedures (including critical care time)  Labs Reviewed  BASIC METABOLIC PANEL - Abnormal; Notable for the following:    Glucose, Bld 103 (*)  GFR calc non Af Amer 50 (*)    All other components within normal limits  URINALYSIS, ROUTINE W REFLEX MICROSCOPIC - Abnormal; Notable for the following:    Appearance CLOUDY (*)    Hgb urine dipstick TRACE (*)    Leukocytes, UA TRACE (*)    All other components within normal limits  URINE MICROSCOPIC-ADD ON - Abnormal; Notable for the following:    Squamous Epithelial / LPF MANY (*)    Bacteria, UA FEW (*)    All other components within normal limits  CBC   Dg Chest 2 View  06/24/2011  *RADIOLOGY REPORT*  Clinical Data: Atrial fibrillation.  CHEST - 2 VIEW  Comparison: 04/29/2009  Findings: Stable heart size.  No edema or infiltrate.  No pleural fluid.  Osteopenia of the thoracic spine.  IMPRESSION: No active disease.  Original Report Authenticated By: Reola Calkins, M.D.   Ct Head Wo Contrast  07/18/2011  *RADIOLOGY REPORT*  Clinical Data: Headache; hypertension.  CT HEAD WITHOUT CONTRAST  Technique:  Contiguous axial images were obtained from the base of the skull through the vertex without contrast.  Comparison: CT of the head performed 02/24/2009  Findings: There is no evidence of acute infarction, mass lesion, or intra- or extra-axial hemorrhage on CT.  The posterior fossa, including the cerebellum, brainstem and fourth ventricle, is within normal limits.  The third and lateral ventricles, and basal ganglia are unremarkable in appearance.  The cerebral hemispheres are symmetric in appearance, with normal gray- white differentiation.  No mass effect or midline shift is seen.   There is no evidence of fracture; visualized osseous structures are unremarkable in appearance.  The orbits are within normal limits. A small mucus retention cyst or polyp is noted within the right maxillary sinus; the remaining paranasal sinuses and mastoid air cells are well-aerated.  Apparent mild focal prominence at the adenoids within the nasopharynx may be slightly more prominent than in 2010, but likely remains within normal limits.  IMPRESSION:  1.  No acute intracranial pathology seen on CT. 2.  Small mucus retention cyst or polyp within the right maxillary sinus. 3.  Apparent mild focal prominence of the adenoids within the nasopharynx is slightly more prominent than in 2010, but likely remains within normal limits.  Original Report Authenticated By: Tonia Ghent, M.D.     No diagnosis found.  Pain is markedly improved.   MDM 305 case d/w Dr. Teressa Lower of cardiology, add 5 mg of Norvasc and office will call for follow up in the am.          Shianne Zeiser Smitty Cords, MD 07/18/11 [phone]

## 2011-07-18 NOTE — ED Notes (Signed)
Care plan and meds reviewed with pt and follow up

## 2011-07-18 NOTE — ED Notes (Signed)
I placed a call to [facility] for a consult call per Dr. Nicanor Alcon.

## 2011-07-18 NOTE — ED Notes (Signed)
MD at bedside. 

## 2011-07-19 ENCOUNTER — Telehealth: Payer: Self-pay | Admitting: Internal Medicine

## 2011-07-19 ENCOUNTER — Ambulatory Visit (INDEPENDENT_AMBULATORY_CARE_PROVIDER_SITE_OTHER): Payer: Medicare Other | Admitting: Physician Assistant

## 2011-07-19 ENCOUNTER — Encounter: Payer: Self-pay | Admitting: Physician Assistant

## 2011-07-19 VITALS — BP 180/110 | HR 66 | Resp 18 | Ht 63.0 in | Wt 227.1 lb

## 2011-07-19 DIAGNOSIS — I4891 Unspecified atrial fibrillation: Secondary | ICD-10-CM

## 2011-07-19 DIAGNOSIS — I10 Essential (primary) hypertension: Secondary | ICD-10-CM

## 2011-07-19 MED ORDER — LOSARTAN POTASSIUM 100 MG PO TABS: 100.0000 mg | ORAL_TABLET | Freq: Every day | ORAL | Status: DC

## 2011-07-19 MED ORDER — AMLODIPINE BESYLATE 10 MG PO TABS: 10.0000 mg | ORAL_TABLET | Freq: Every day | ORAL | Status: DC

## 2011-07-19 NOTE — Telephone Encounter (Signed)
Pt wants to know if ok to take norvasc when she went to [place] in [place] when her BP was high please call

## 2011-07-19 NOTE — Telephone Encounter (Signed)
Patient was seen today with [name].

## 2011-07-19 NOTE — Progress Notes (Addendum)
History of Present Illness: Primary Cardiologist: Dr. Olga Millers   [name] is a 65 y.o. female with past medical history of coronary artery disease and atrial fibrillation. Last cardiac catheterization on Mar 06, 2007 showed an ejection fraction of 55%. There was nonobstructive plaque in the LAD, and circumflex had 20% and the right coronary artery was normal.  Note previous myocardial infarction was felt secondary to a first diagonal branch occlusion.  Echocardiogram in March of 2010 showed normal LV function, moderate biatrial enlargement and moderate tricuspid regurgitation.  She has had a previous TEE guided cardioversion on Feb 27, 2009.  She has been extremely sensitive to Coumadin in the past.  Previous TSH was normal.  Carotid Dopplers in February of 2011 showed 0-39% stenosis bilaterally.  She was admitted in August of 2012 with recurrent atrial fibrillation and converted to sinus.  Tikosyn was initiated and Gibson Ramp also initiated.  She was seen in the [facility] ED recently with elevated blood pressure.  She complained of a headache and Head CT 07/18/11: no acute intracranial pathology, small mucus retention cyst or polyp in right maxillary sinus, apparent mild focal prominence of adenoids within the nasopharynx, slightly more prominent than 2010, but likely remains WNL.  Labs: UA with trace blood and LE.  WBC 10.8, Hgb 12.9, Plt 232, K 4.0, BUN 20, creatinine 1.10.  She was placed on norvasc 5 mg QD and asked to follow up today.    BPs still running up at home.  She has had readings as high as 206/88.  Has occasional headaches.  Has occasional atypical chest pains.  No exertional heaviness or tightness.  No syncope.  No orthopnea, PND or edema.  No difficulty with speech, facial droop or unilateral weakness.  She sleeps with CPAP.  No OTC cold meds.  Took doxycycline recently for diverticulitis.  She is finished with this.  Does not eat much salt.  Of note, diltiazem was d/c when she started  Tikosyn.  She was previously on 120 mg BID with well controlled BP.  Past Medical History  Diagnosis Date  . CAD (coronary artery disease)   . Depression   . GERD (gastroesophageal reflux disease)   . Hyperlipidemia   . Hypertension   . Crohn's colitis     she reports ulcerative colitis beginning in her 90's, biopsies 2008 suggest Crohn's not UC  . Pseudoaneurysm of right femoral artery   . Pancreatitis   . Chronic sinusitis   . Renal oncocytoma     s/p right nephrectomy  . Myocardial infarction   . Uveitis   . Anxiety   . Obesity   . Chronic cough   . OSA (obstructive sleep apnea)   . Paroxysmal atrial fibrillation   . History of colonoscopy     Current Outpatient Prescriptions  Medication Sig Dispense Refill  . chlorpheniramine (CHLOR-TRIMETON) 4 MG tablet Take 4 mg by mouth 2 (two) times daily as needed. 1 daily as needed        . dofetilide (TIKOSYN) 125 MCG capsule Take 375 mcg by mouth 2 (two) times daily.        Marland Kitchen doxycycline (VIBRA-TABS) 100 MG tablet Take 1 tablet (100 mg total) by mouth 2 (two) times daily.  20 tablet  0  . escitalopram (LEXAPRO) 10 MG tablet Take 1 tablet (10 mg total) by mouth daily. 1 daily   30 tablet  5  . esomeprazole (NEXIUM) 40 MG capsule Take 1 capsule (40 mg total) by mouth 2 (  two) times daily.  180 capsule  2  . Glucosamine-Chondroitin (GLUCOSAMINE CHONDR COMPLEX PO) Take by mouth. 1 daily        . loperamide (IMODIUM A-D) 2 MG tablet Take 2 mg by mouth 4 (four) times daily as needed. Prn       . metoprolol tartrate (LOPRESSOR) 25 MG tablet TAKE 1/2 TABLET TWICE DAILY  180 tablet  1  . Multiple Vitamin (MULTIVITAMIN) capsule Take 1 capsule by mouth daily. 1 daily        . nitroGLYCERIN (NITROSTAT) 0.4 MG SL tablet Place 0.4 mg under the tongue every 5 (five) minutes as needed. 1 tab under tongue every 5 min for chest pain not to exceed 3 tabs in 15 min       . Probiotic Product (PHILLIPS COLON HEALTH) CAPS Take by mouth. 1 per day         . Rivaroxaban (XARELTO) 20 MG TABS Take 1 tablet by mouth daily.        Marland Kitchen DISCONTD: amLODipine (NORVASC) 2.5 MG tablet Take 2 tablets (5 mg total) by mouth daily.  15 tablet  0  . DISCONTD: losartan (COZAAR) 25 MG tablet Take 1 tablet (25 mg total) by mouth daily. 1 by mouth 2 times daily   180 tablet  1  . DISCONTD: losartan (COZAAR) 25 MG tablet 2 tablets  by mouth 2 times daily        . amLODipine (NORVASC) 10 MG tablet Take 1 tablet (10 mg total) by mouth daily.  30 tablet  11  . losartan (COZAAR) 100 MG tablet Take 1 tablet (100 mg total) by mouth daily.  30 tablet  11    Allergies: Allergies  Allergen Reactions  . Codeine   . Latex   . Mucilgen (Psyllium)   . Mucinex Nausea And Vomiting and Other (See Comments)    headaches    Vital Signs: BP 180/110  Pulse 66  Resp 18  Ht 5\' 3"  (1.6 m)  Wt 227 lb 1.9 oz (103.021 kg)  BMI 40.23 kg/m2 Repeat BP by me:  Left 178/90  PHYSICAL EXAM: Well nourished, well developed, in no acute distress HEENT: normal Neck: no JVD Vascular: no carotid bruits Cardiac:  normal S1, S2; RRR; no murmur Lungs:  clear to auscultation bilaterally, no wheezing, rhonchi or rales Abd: soft, nontender, no hepatomegaly,  I cannot appreciate any abdominal bruits Ext: trace bilateral edema Skin: warm and dry Neuro:  CNs 2-12 intact, no focal abnormalities noted Psych: normal affect  EKG:  NSR, HR 66, normal axis, no ischemic changes, QTc 463  ASSESSMENT AND PLAN:

## 2011-07-19 NOTE — Patient Instructions (Signed)
Your physician recommends that you schedule a follow-up appointment in: 07/25/11 @ 10 AM WITH SCOTT WEAVER, PA-C  Your physician has requested that you have a renal artery duplex DX 401.1 HTN AND TO R/O RAS (RENAL ARTERIAL STENOSIS). During this test, an ultrasound is used to evaluate blood flow to the kidneys. Allow one hour for this exam. Do not eat after midnight the day before and avoid carbonated beverages. Take your medications as you usually do.  Your physician recommends that you return for lab work in: 24 HOUR URINE FOR VMA, METANEPHRINE'S, CATACHOLAMINES  Your physician has recommended you make the following change in your medication: TAKE NORVASC 10 MG ONCE DAILY; TAKE LOSARTAN 100 MG ONCE DAILY

## 2011-07-19 NOTE — Assessment & Plan Note (Signed)
Maintaining NSR on Tikosyn.  Continue Xarelto.

## 2011-07-19 NOTE — Assessment & Plan Note (Signed)
I suspect this was all driven by discontinuation of her diltiazem.  However, she has a h/o right renal artery aneurysm repair at age 65.  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  She follows with a urologist in [facility].  For completeness, I am going to have her get a renal artery ultrasound to rule out renal artery stenosis.  I question if she is describing FMD with what happened at age 9.  I will also get a 24 hour urine for VMA, metanephrines, catecholamines.  She is splitting her norvasc and losartan bid.  I have asked her take medicines all at once in the AM.  She will therefore be on Losartan 100 mg QD.  Increase Norvasc to 10 mg QD.  Follow up with me within the next week.  We discussed symptoms to prompt her to go to the ED.  If pressure still up, consider changing metoprolol to coreg.  Also, consider adding hydralazine or clonidine.

## 2011-07-21 ENCOUNTER — Telehealth: Payer: Self-pay | Admitting: Cardiology

## 2011-07-21 ENCOUNTER — Other Ambulatory Visit: Payer: Self-pay | Admitting: Physician Assistant

## 2011-07-21 ENCOUNTER — Other Ambulatory Visit: Payer: Medicare Other | Admitting: *Deleted
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
## 2011-07-25 ENCOUNTER — Encounter: Payer: Self-pay | Admitting: Physician Assistant

## 2011-07-25 ENCOUNTER — Ambulatory Visit (INDEPENDENT_AMBULATORY_CARE_PROVIDER_SITE_OTHER): Payer: Medicare Other | Admitting: Physician Assistant

## 2011-07-25 VITALS — BP 142/74 | HR 60 | Ht 63.0 in | Wt 225.0 lb

## 2011-07-25 DIAGNOSIS — K5010 Crohn's disease of large intestine without complications: Secondary | ICD-10-CM

## 2011-07-25 DIAGNOSIS — B3731 Acute candidiasis of vulva and vagina: Secondary | ICD-10-CM | POA: Insufficient documentation

## 2011-07-25 DIAGNOSIS — I10 Essential (primary) hypertension: Secondary | ICD-10-CM

## 2011-07-25 DIAGNOSIS — B373 Candidiasis of vulva and vagina: Secondary | ICD-10-CM

## 2011-07-25 DIAGNOSIS — I4891 Unspecified atrial fibrillation: Secondary | ICD-10-CM

## 2011-07-25 NOTE — Assessment & Plan Note (Signed)
OTC Monistat ok to use.  Diflucan cannot be used due to possible prolonged QT with Tiksosyn.  Discussed this with patient.

## 2011-07-25 NOTE — Patient Instructions (Signed)
Your physician recommends that you schedule a follow-up appointment in: 2 WEEKS FOR A NURSE VISIT FOR BLOOD PRESSURE CHECK AND EKG  IT IS OK TO USE LIALDO TWICE DAILY, ALSO IT IS OK TO USE MONISTAT OTC TOPICAL AS DIRECTED

## 2011-07-25 NOTE — Progress Notes (Signed)
History of Present Illness: Primary Cardiologist: Dr. Olga Millers   [name] is a 65 y.o. female with past medical history of coronary artery disease and atrial fibrillation. Last cardiac catheterization on Mar 06, 2007 showed an EF of 55%. There was nonobstructive plaque in the LAD, and CFX had 20% and the RCA was normal.  Note previous MI was felt secondary to a D1 branch occlusion.  Echo 3/10 showed normal LVF, moderate BAE and moderate TR.  She has had a previous TEE guided DCCV on 02/27/09.  She has been extremely sensitive to Coumadin in the past.  Previous TSH was normal.  Carotid Dopplers in 11/2009 showed 0-39% stenosis bilaterally.  She was admitted in 05/2011 with recurrent atrial fibrillation and converted to NSR.  Tikosyn was initiated and Gibson Ramp also initiated.  I saw her last week after a visit to the [facility] ED with elevated blood pressure.  She complained of a headache and head CT 07/18/11: no acute intracranial pathology, small mucus retention cyst or polyp in right maxillary sinus, apparent mild focal prominence of adenoids within the nasopharynx, slightly more prominent than 2010, but likely remains WNL.  Of note, diltiazem was discontinued with the initiation of Tikosyn.  Elevations in her blood pressure seemed to coincide with this.  I had her adjust her medications last time by increasing her losartan 200 mg all at once and Norvasc 10 mg all at once per day.  Renal artery ultrasound, 24-hour urine are both pending.  She returns for followup on blood pressure.  BP today looks much better.  Still having occasional headaches.  Feels better on once daily meds.  BPs at home close to what we got here today.  She has had some 170 systolic readings.  Still has occasional atypical chest pains.  No dyspnea.  No syncope.  Notes one brief episode of palpitations.  Has a yeast infection now after taking recent round of antibiotics.    Past Medical History  Diagnosis Date  . CAD (coronary artery  disease)   . Depression   . GERD (gastroesophageal reflux disease)   . Hyperlipidemia   . Hypertension   . Crohn's colitis     she reports ulcerative colitis beginning in her 5's, biopsies 2008 suggest Crohn's not UC  . Pseudoaneurysm of right femoral artery   . Pancreatitis   . Chronic sinusitis   . Renal oncocytoma     s/p right nephrectomy  . Myocardial infarction   . Uveitis   . Anxiety   . Obesity   . Chronic cough   . OSA (obstructive sleep apnea)   . Paroxysmal atrial fibrillation   . History of colonoscopy     Current Outpatient Prescriptions  Medication Sig Dispense Refill  . amLODipine (NORVASC) 10 MG tablet Take 1 tablet (10 mg total) by mouth daily.  30 tablet  11  . chlorpheniramine (CHLOR-TRIMETON) 4 MG tablet Take 4 mg by mouth 2 (two) times daily as needed. 1 daily as needed        . dofetilide (TIKOSYN) 125 MCG capsule Take 375 mcg by mouth 2 (two) times daily.        Marland Kitchen escitalopram (LEXAPRO) 10 MG tablet Take 1 tablet (10 mg total) by mouth daily. 1 daily   30 tablet  5  . esomeprazole (NEXIUM) 40 MG capsule Take 1 capsule (40 mg total) by mouth 2 (two) times daily.  180 capsule  2  . Glucosamine-Chondroitin (GLUCOSAMINE CHONDR COMPLEX PO) Take by mouth.  1 daily        . loperamide (IMODIUM A-D) 2 MG tablet Take 2 mg by mouth as needed. Prn half tablet      . losartan (COZAAR) 100 MG tablet Take 1 tablet (100 mg total) by mouth daily.  30 tablet  11  . metoprolol tartrate (LOPRESSOR) 25 MG tablet TAKE 1/2 TABLET TWICE DAILY  180 tablet  1  . Multiple Vitamin (MULTIVITAMIN) capsule Take 1 capsule by mouth daily. 1 daily        . nitroGLYCERIN (NITROSTAT) 0.4 MG SL tablet Place 0.4 mg under the tongue every 5 (five) minutes as needed. 1 tab under tongue every 5 min for chest pain not to exceed 3 tabs in 15 min       . Probiotic Product (PHILLIPS COLON HEALTH) CAPS Take by mouth. 1 per day       . Rivaroxaban (XARELTO) 20 MG TABS Take 1 tablet by mouth daily.           Allergies: Allergies  Allergen Reactions  . Codeine   . Latex   . Mucilgen (Psyllium)   . Mucinex Nausea And Vomiting and Other (See Comments)    headaches    Vital Signs: BP 142/74  Pulse 60  Ht 5\' 3"  (1.6 m)  Wt 225 lb (102.059 kg)  BMI 39.86 kg/m2  PHYSICAL EXAM: Well nourished, well developed, in no acute distress HEENT: normal Neck: no JVD Cardiac:  normal S1, S2; RRR; no murmur Lungs:  clear to auscultation bilaterally, no wheezing, rhonchi or rales Abd: soft, nontender, no hepatomegaly Ext: trace bilateral edema Skin: warm and dry Neuro:  CNs 2-12 intact, no focal abnormalities noted Psych: normal affect  ASSESSMENT AND PLAN:

## 2011-07-25 NOTE — Assessment & Plan Note (Signed)
Maintaining sinus rhythm by exam.  Continues Xarelto and Tikosyn.  Followup with Dr. Jens Som in December as scheduled.
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
## 2011-07-25 NOTE — Assessment & Plan Note (Signed)
No contraindication from cardiac standpoint to resume Lialda.  She can restart this.

## 2011-07-25 NOTE — Assessment & Plan Note (Addendum)
Much better controlled.  Continue current medications.  Return in 2 weeks for a blood pressure check and ECG with the nurse.  Consider clonidine versus hydralazine if her blood pressure remains uncontrolled.  Renal dopplers and 24 hr urine pending.

## 2011-07-28 ENCOUNTER — Other Ambulatory Visit: Payer: Self-pay | Admitting: Internal Medicine

## 2011-07-28 DIAGNOSIS — K5010 Crohn's disease of large intestine without complications: Secondary | ICD-10-CM

## 2011-07-28 MED ORDER — MESALAMINE ER 0.375 G PO CP24: 1500.0000 mg | ORAL_CAPSULE | Freq: Every day | ORAL | Status: DC

## 2011-07-29 ENCOUNTER — Telehealth: Payer: Self-pay | Admitting: Gastroenterology

## 2011-07-29 NOTE — Telephone Encounter (Signed)
Message copied by Bernita Buffy on Fri Jul 29, 2011  9:37 AM ------      Message from: Stan Head E      Created: Thu Jul 28, 2011  4:28 PM      Regarding: change in med       Please let her know I prescribed Apriso to replace Lialda - she needs to pick it up

## 2011-07-29 NOTE — Telephone Encounter (Signed)
Patient was informed per Dr' Gessner's orders that he changed Lialda to [facility] and she could pick that up.

## 2011-08-02 ENCOUNTER — Telehealth: Payer: Self-pay | Admitting: Cardiology

## 2011-08-02 NOTE — Telephone Encounter (Signed)
Pt recently put on xarelto. This morning sitting at pt computer pt nose started to bleed and pt is not the type of person to have nose bleeds and wants to know if this is a concern. Please return pt call to discuss further.

## 2011-08-02 NOTE — Telephone Encounter (Signed)
Spoke with pt, she has problems with allergies and takes occ allergy medicine. The nose bleed was light and did not last long. She will use saline nasal spray. She also reports seeing bright red blood when wiping today for the second time. She does have hemorrhoids and feels it maybe related. She will cont to watch and let us know of cont problems [name]

## 2011-08-04 ENCOUNTER — Encounter (INDEPENDENT_AMBULATORY_CARE_PROVIDER_SITE_OTHER): Payer: Medicare Other | Admitting: Cardiology

## 2011-08-04 DIAGNOSIS — I701 Atherosclerosis of renal artery: Secondary | ICD-10-CM

## 2011-08-04 DIAGNOSIS — I10 Essential (primary) hypertension: Secondary | ICD-10-CM

## 2011-08-04 NOTE — H&P (Signed)
[name], [name] NO.:  [id number]  MEDICAL RECORD NO.:  [id number]  LOCATION:  2010                         FACILITY:  [facility]  PHYSICIAN:  Rollene Rotunda, MD, FACCDATE OF BIRTH:  Mar 30, 1946  DATE OF ADMISSION:  06/23/2011 DATE OF DISCHARGE:                             HISTORY & PHYSICAL   PRIMARY CARE PROVIDER:  Barbette Hair. Artist Pais, DO.  CARDIOLOGISTMadolyn Frieze. Jens Som, MD, [facility].  REASON FOR PRESENTATION:  The patient with atrial fibrillation.  HISTORY OF PRESENT ILLNESS:  The patient is a 65 year old white female with a history of paroxysmal atrial fibrillation.  She has occasional paroxysms that are self-limited.  She has not wanted Coumadin in the past.  She has been intolerant of amiodarone.  She developed atrial fibrillation suddenly around 4 a.m. Tuesday morning.  It woke her from her sleep.  She feels palpitations.  She felt quite diaphoretic.  She did not have any presyncope or syncope.  She had a little chest heaviness under the left breast.  She had some mild left arm discomfort. She did not have any new shortness of breath, PND, or orthopnea.  The symptoms persisted and today, she presented to urgent care.  She was thus found to be in AFib and referred to the ER.  Her rate has been actually reasonably well controlled.  Set up was well controlled at home with the AFib.  Her blood pressure also was normal by her report.  She has otherwise been feeling at her baseline.  PAST MEDICAL HISTORY: 1. Atrial fibrillation. 2. Coronary artery disease (diagonal occlusion). 3. Hypertension. 4. Ulcerative colitis. 5. Crohn disease. 6. Pseudoaneurysm following catheterization. 7. Sleep apnea. 8. Depression. 9. Pancreatitis.  PAST SURGICAL HISTORY: 1. Cholecystectomy. 2. Right renal artery aneurysm repair. 3. Nephrectomy of the right kidney with subsequent diagnosis of the     renal cyst. 4. Tubal ligation.  ALLERGIES AND INTOLERANCES:  MUCINEX,  LATEX, SULFA, CODEINE, and ASACOL.  MEDICATIONS: 1. Lexapro 10 mg daily. 2. Nexium 40 mg daily. 3. Metoprolol 25 mg b.i.d. 4. Cozaar 25 mg daily. 5. Aspirin 81 mg daily. 6. Hydrochlorothiazide 6.25 mg b.i.d. 7. Fluticasone 2 sprays each nostril. 8. Heparin. 9. Cardizem 30 mg q.6 h. 10.CPAP.  SOCIAL HISTORY:  The patient has never smoked cigarettes.  She is married.  She has 3 children.  FAMILY HISTORY:  Noncontributory for early coronary artery disease.  She does have first-degree relatives with CVAs.  Her mother had heart disease at age 73.  REVIEW OF SYSTEMS:  As stated in the HPI, negative for all other systems.  PHYSICAL EXAMINATION:  GENERAL:  The patient is in no distress. VITAL SIGNS:  Blood pressure 124/55, heart rate 72 and irregular, afebrile. HEENT:  Eyelids are unremarkable.  Pupils are equal, round, and reactive to light.  Fundi not visualized.  Oral mucosa unremarkable. NECK:  No jugular venous distention at 45 degrees, carotid upstroke brisk and symmetrical.  No bruits, no thyromegaly. LYMPHATICS:  No cervical, axillary, inguinal adenopathy. LUNGS:  Bilateral left greater than right basilar crackles. BACK:  No costovertebral angle tenderness. CHEST:  Unremarkable. HEART:  PMI not displaced or sustained.  S1 and S2 within normal.  No S3, no clicks, no rubs, 2/6 left sternal border holosystolic murmur, no diastolic murmurs. ABDOMEN:  Obese, positive bowel sounds normal in frequency and pitch. No bruits, no rebound, no guarding or midline pulsatile mass.  No hepatomegaly, no splenomegaly. SKIN:  No rashes, no nodules. EXTREMITIES:  2+ pulses throughout, no edema, no cyanosis, no clubbing. NEUROLOGIC:  Oriented to person, place, and time.  Cranial nerves II through XII grossly intact, motor grossly intact.  EKG; atrial fibrillation, rate 111, axis within normal limits, intervals within normal limits, early transition, no acute ST-T wave changes.  LABORATORY  DATA:  Sodium 139, potassium 4.5, BUN 21, creatinine 1.17. WBC 10.4, hemoglobin 14.9, platelets 261.  ASSESSMENT AND PLAN: 1. Atrial fibrillation.  The patient has now atrial fibrillation which     is greater than 48 hours.  I am going to start her on heparin.  I     will begin Coumadin tonight as we may need this at least for the     short term.  I will leave her n.p.o. to consider TEE-guided     cardioversion tomorrow.  Ultimately, Dr. Jens Som will need to make     a decision about an antiarrhythmic as she has had now recurrence of     this.  Of note, he had mentioned amiodarone in his last clinic     note, but the patient tells me she is intolerant of this.  I will     check a TSH. 2. Coronary artery disease.  She has some mild chest heaviness and a     previous history of coronary artery disease.  I will cycle cardiac     enzymes and repeat an EKG in the morning. 3. Hypertension.  I will continue the other meds as listed.  I will     include p.o. Cardizem, though I will give it in divided doses     today. 4. Sleep apnea.  She will continue with CPAP.     Rollene Rotunda, MD, [facility]     JH/MEDQ  D:  06/23/2011  T:  06/23/2011  Job:  [id number]  Electronically Signed by Rollene Rotunda MD [facility] on 08/04/2011 01:34:06 PM

## 2011-08-09 ENCOUNTER — Other Ambulatory Visit: Payer: Self-pay | Admitting: Physician Assistant

## 2011-08-09 ENCOUNTER — Ambulatory Visit (INDEPENDENT_AMBULATORY_CARE_PROVIDER_SITE_OTHER): Payer: Medicare Other

## 2011-08-09 DIAGNOSIS — I4891 Unspecified atrial fibrillation: Secondary | ICD-10-CM

## 2011-08-09 DIAGNOSIS — I10 Essential (primary) hypertension: Secondary | ICD-10-CM

## 2011-08-09 NOTE — Progress Notes (Signed)
Ok. ECG reviewed. Continue current medications.   Tereso Newcomer, PA-C

## 2011-08-09 NOTE — Progress Notes (Signed)
Pt here for blood pressure check. She has no complaints. Taking all meds as prescribed. EKG done. Blood pressure 146/79.

## 2011-08-10 ENCOUNTER — Other Ambulatory Visit: Payer: Self-pay | Admitting: Physician Assistant

## 2011-08-10 ENCOUNTER — Other Ambulatory Visit: Payer: Self-pay | Admitting: *Deleted

## 2011-08-10 DIAGNOSIS — I10 Essential (primary) hypertension: Secondary | ICD-10-CM

## 2011-08-11 ENCOUNTER — Telehealth: Payer: Self-pay | Admitting: Cardiology

## 2011-08-11 ENCOUNTER — Other Ambulatory Visit: Payer: Self-pay | Admitting: Physician Assistant

## 2011-08-11 DIAGNOSIS — N289 Disorder of kidney and ureter, unspecified: Secondary | ICD-10-CM

## 2011-08-11 LAB — BASIC METABOLIC PANEL
BUN: 14
CO2: 30
Chloride: 105
Creatinine, Ser: 1.01
GFR calc Af Amer: >60
Potassium: 3.6

## 2011-08-11 NOTE — Telephone Encounter (Signed)
Pt turned in 24 hr urine and wants to make sure paperwork is in place because this is the second time she had to do this

## 2011-08-11 NOTE — Telephone Encounter (Signed)
Spoke with pt, PAPERWORK OK FOR 24 HOUR URINE [name]

## 2011-08-11 NOTE — Telephone Encounter (Signed)
MRA OF THE RENAL ARTERIES TO R/O RENAL ARTERY STENOSIS DX RENAL INSUFFICIENCY. THIS WILL BE DONE @ [facility], PT AWARE THAT WE WILL CALL HER WITH A DATE AND TIME OF TEST. Danielle Rankin

## 2011-08-13 LAB — CATECHOLAMINES, FRACTIONATED, URINE, 24 HOUR
Calculated Total (E+NE): 30 mcg/24 h (ref 26–121)
Dopamine, 24 hr Urine: 178 mcg/24 h (ref 52–480)
Epinephrine, 24 hr Urine: 4 mcg/24 h (ref 2–24)
Total Volume - CF 24Hr U: 1775 mL

## 2011-08-15 ENCOUNTER — Ambulatory Visit (HOSPITAL_COMMUNITY)
Admission: RE | Admit: 2011-08-15 | Discharge: 2011-08-15 | Disposition: A | Discharge status: Home / Self Care | Payer: Medicare Other | Source: Ambulatory Visit | Attending: Cardiology | Admitting: Cardiology

## 2011-08-15 DIAGNOSIS — N289 Disorder of kidney and ureter, unspecified: Secondary | ICD-10-CM

## 2011-08-15 DIAGNOSIS — I10 Essential (primary) hypertension: Secondary | ICD-10-CM | POA: Insufficient documentation

## 2011-08-15 LAB — METANEPHRINES, URINE, 24 HOUR: Metaneph Total, Ur: 424 mcg/24 h (ref 224–832)

## 2011-08-15 MED ORDER — GADOBENATE DIMEGLUMINE 529 MG/ML IV SOLN
20.0000 mL | Freq: Once | INTRAVENOUS | Status: AC
  Administered 2011-08-15: 20 mL via INTRAVENOUS

## 2011-08-17 ENCOUNTER — Telehealth: Payer: Self-pay | Admitting: Cardiology

## 2011-08-17 DIAGNOSIS — T148XXA Other injury of unspecified body region, initial encounter: Secondary | ICD-10-CM

## 2011-08-17 NOTE — Telephone Encounter (Signed)
Pt has been taking xartelto and now has noticed bruises on her arms please call

## 2011-08-17 NOTE — Telephone Encounter (Signed)
Spoke with pt, she has noticed bruising on her arm this week. She is taking xarelto, she does not take an aspirin but is on apiso for her chron's disease. According to sally putt, pharm md, should not cause increased bruising. Reassurance given to pt that labs were good prior to starting and is probably related to the xarelto. She would like to make dr Jens Som aware [name]

## 2011-08-18 NOTE — Telephone Encounter (Signed)
Check CBC Brian Crenshaw  

## 2011-08-19 NOTE — Telephone Encounter (Signed)
Spoke with pt, she will come Monday for labs [facility]

## 2011-08-22 ENCOUNTER — Other Ambulatory Visit (INDEPENDENT_AMBULATORY_CARE_PROVIDER_SITE_OTHER): Payer: Medicare Other | Admitting: *Deleted

## 2011-08-22 DIAGNOSIS — T148XXA Other injury of unspecified body region, initial encounter: Secondary | ICD-10-CM

## 2011-08-22 LAB — CBC WITH DIFFERENTIAL/PLATELET
Basophils Relative: 0.3 % (ref 0.0–3.0)
Eosinophils Absolute: 0.1 10*3/uL (ref 0.0–0.7)
Eosinophils Relative: 1.3 % (ref 0.0–5.0)
HCT: 39.6 % (ref 36.0–46.0)
Lymphs Abs: 1.9 10*3/uL (ref 0.7–4.0)
MCHC: 33.4 g/dL (ref 30.0–36.0)
MCV: 94.6 fl (ref 78.0–100.0)
Monocytes Absolute: 0.5 10*3/uL (ref 0.1–1.0)
Neutrophils Relative %: 70.8 % (ref 43.0–77.0)
RBC: 4.19 Mil/uL (ref 3.87–5.11)

## 2011-08-23 ENCOUNTER — Telehealth: Payer: Self-pay | Admitting: Cardiology

## 2011-08-23 NOTE — Telephone Encounter (Signed)
Spoke with pt, okay given for pt to have a flu shot [facility]

## 2011-08-23 NOTE — Telephone Encounter (Signed)
Pt calling wanting to know if it is ok for her to get a flu shot considering pt is taking tikosyn. Please return pt call to discuss further.

## 2011-09-01 ENCOUNTER — Telehealth: Payer: Self-pay | Admitting: Cardiology

## 2011-09-01 DIAGNOSIS — K219 Gastro-esophageal reflux disease without esophagitis: Secondary | ICD-10-CM

## 2011-09-01 DIAGNOSIS — I10 Essential (primary) hypertension: Secondary | ICD-10-CM

## 2011-09-01 DIAGNOSIS — I4891 Unspecified atrial fibrillation: Secondary | ICD-10-CM

## 2011-09-01 MED ORDER — ESOMEPRAZOLE MAGNESIUM 40 MG PO CPDR: 40.0000 mg | DELAYED_RELEASE_CAPSULE | Freq: Two times a day (BID) | ORAL | Status: DC

## 2011-09-01 MED ORDER — METOPROLOL TARTRATE 25 MG PO TABS: ORAL_TABLET | ORAL | Status: DC

## 2011-09-01 NOTE — Telephone Encounter (Signed)
Per D Mathis Dr. Genice Rouge does not lexapro

## 2011-09-01 NOTE — Telephone Encounter (Signed)
Pt nexium, generic lexapro, metoprolol, 30 day supply [facility] in [place]

## 2011-09-12 ENCOUNTER — Telehealth: Payer: Self-pay | Admitting: Internal Medicine

## 2011-09-12 ENCOUNTER — Other Ambulatory Visit: Payer: Self-pay | Admitting: *Deleted

## 2011-09-12 MED ORDER — ESCITALOPRAM OXALATE 10 MG PO TABS: 10.0000 mg | ORAL_TABLET | Freq: Every day | ORAL | Status: DC

## 2011-09-12 NOTE — Telephone Encounter (Signed)
rx sent in electronically 

## 2011-09-12 NOTE — Telephone Encounter (Signed)
Per [facility] on [address], refill lexapro

## 2011-10-10 ENCOUNTER — Ambulatory Visit (INDEPENDENT_AMBULATORY_CARE_PROVIDER_SITE_OTHER): Payer: Medicare Other | Admitting: Cardiology

## 2011-10-10 ENCOUNTER — Encounter: Payer: Self-pay | Admitting: Cardiology

## 2011-10-10 VITALS — BP 158/71 | HR 55 | Ht 63.0 in | Wt 225.0 lb

## 2011-10-10 DIAGNOSIS — E785 Hyperlipidemia, unspecified: Secondary | ICD-10-CM

## 2011-10-10 DIAGNOSIS — I4891 Unspecified atrial fibrillation: Secondary | ICD-10-CM

## 2011-10-10 LAB — CBC WITH DIFFERENTIAL/PLATELET
Basophils Absolute: 0 10*3/uL (ref 0.0–0.1)
Basophils Relative: 0.1 % (ref 0.0–3.0)
Eosinophils Absolute: 0.2 10*3/uL (ref 0.0–0.7)
Lymphocytes Relative: 24 % (ref 12.0–46.0)
MCHC: 33.8 g/dL (ref 30.0–36.0)
MCV: 95 fl (ref 78.0–100.0)
Monocytes Absolute: 0.5 10*3/uL (ref 0.1–1.0)
Neutrophils Relative %: 68.7 % (ref 43.0–77.0)
Platelets: 263 10*3/uL (ref 150.0–400.0)
RBC: 4.05 Mil/uL (ref 3.87–5.11)
RDW: 13.4 % (ref 11.5–14.6)

## 2011-10-10 LAB — BASIC METABOLIC PANEL
Calcium: 9.4 mg/dL (ref 8.4–10.5)
GFR: 44.73 mL/min — ABNORMAL LOW (ref >60.00)
Glucose, Bld: 99 mg/dL (ref 70–99)
Potassium: 4.1 mEq/L (ref 3.5–5.1)
Sodium: 140 mEq/L (ref 135–145)

## 2011-10-10 LAB — MAGNESIUM: Magnesium: 2.2 mg/dL (ref 1.5–2.5)

## 2011-10-10 MED ORDER — HYDRALAZINE HCL 25 MG PO TABS: 25.0000 mg | ORAL_TABLET | Freq: Three times a day (TID) | ORAL | Status: DC

## 2011-10-10 NOTE — Progress Notes (Signed)
[id number] female with past medical history of coronary artery disease and atrial fibrillation returns for followup. Last cardiac catheterization on Mar 06, 2007 showed an ejection fraction of 55%. There is nonobstructive plaque in the LAD, and circumflex and the right coronary artery was normal. 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 Echocardiogram in March of 2010 showed normal LV function, moderate biatrial enlargement and moderate tricuspid regurgitation. She has had a previous TEE guided cardioversion on Feb 27, 2009. Also note the patient is extremely sensitive to Coumadin. Previous TSH is normal. Carotid Dopplers in February of 2011 showed 0-39% stenosis bilaterally. Patient admitted in August of 2012 with recurrent atrial fibrillation and converted to sinus. Tikosyn initiated. Gibson Ramp also initiated. Seen in September with hypertension. MRA of the abdomen in September of 2012 showed no left renal artery stenosis and previous right nephrectomy. 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 Since she was last seen, the patient has dyspnea with more extreme activities but not with routine activities. It is relieved with rest. It is not associated with chest pain. There is no orthopnea, PND or pedal edema. There is no syncope or palpitations. There is no exertional chest pain.    Current Outpatient Prescriptions  Medication Sig Dispense Refill  . amLODipine (NORVASC) 10 MG tablet Take 1 tablet (10 mg total) by mouth daily.  30 tablet  11  . ammonium lactate (LAC-HYDRIN) 12 % lotion as needed.      . chlorpheniramine (CHLOR-TRIMETON) 4 MG tablet Take 4 mg by mouth 2 (two) times daily as needed. 1 daily as needed        . dofetilide (TIKOSYN) 125 MCG capsule Take 375 mcg by mouth 2 (two) times daily.        Marland Kitchen escitalopram (LEXAPRO) 10 MG tablet Take 5 mg by mouth daily.        Marland Kitchen esomeprazole (NEXIUM) 40 MG capsule Take 1 capsule (40 mg total) by mouth 2 (two) times  daily.  60 capsule  12  . Glucosamine-Chondroitin (GLUCOSAMINE CHONDR COMPLEX PO) Take by mouth. 1 daily        . loperamide (IMODIUM A-D) 2 MG tablet Take 2 mg by mouth as needed. Prn half tablet      . losartan (COZAAR) 100 MG tablet Take 1 tablet (100 mg total) by mouth daily.  30 tablet  11  . mesalamine (APRISO) 0.375 G 24 hr capsule Take 4 capsules (1.5 g total) by mouth daily.  120 capsule  5  . metoprolol tartrate (LOPRESSOR) 25 MG tablet TAKE 1/2 TABLET TWICE DAILY  30 tablet  12  . Multiple Vitamin (MULTIVITAMIN) capsule Take 1 capsule by mouth daily. 1 daily        . nitroGLYCERIN (NITROSTAT) 0.4 MG SL tablet Place 0.4 mg under the tongue every 5 (five) minutes as needed. 1 tab under tongue every 5 min for chest pain not to exceed 3 tabs in 15 min       . Probiotic Product (PHILLIPS COLON HEALTH) CAPS Take by mouth. 1 per day       . Rivaroxaban (XARELTO) 20 MG TABS Take 1 tablet by mouth daily.           Past Medical History  Diagnosis Date  . CAD (coronary artery disease)   . Depression   . GERD (gastroesophageal reflux disease)   . Hyperlipidemia   . Hypertension   . Crohn's colitis     she reports ulcerative colitis beginning in  her 30's, biopsies 2008 suggest Crohn's not UC  . Pseudoaneurysm of right femoral artery   . Pancreatitis   . Chronic sinusitis   . Renal oncocytoma     s/p right nephrectomy  . Myocardial infarction   . Uveitis   . Anxiety   . Obesity   . Chronic cough   . OSA (obstructive sleep apnea)   . Paroxysmal atrial fibrillation   . History of colonoscopy     Past Surgical History  Procedure Date  . Nephrectomy 10/2006    right secondary to oncocytoma   . Breast biopsy 1996    benign lesion   . Cholecystectomy 10/2006  . Tubal ligation   . Right renal artery repair 1976  . Right femoral  pseudoaneurysm & right groin hematoma evacuation 02/2007    History   Social History  . Marital Status: Married    Spouse Name: N/A    Number of  Children: 3  . Years of Education: N/A   Occupational History  .      retired   Social History Main Topics  . Smoking status: Never Smoker   . Smokeless tobacco: Never Used  . Alcohol Use: No  . Drug Use: No  . Sexually Active: Not Currently   Other Topics Concern  . 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 Remaining systems are negative.  Physical Exam: Well-developed well-nourished in no acute distress.  Skin is warm and dry.  HEENT is normal.  Neck is supple. No thyromegaly.  Chest is clear to auscultation with normal expansion.  Cardiovascular exam is regular rate and rhythm.  Abdominal exam nontender or distended. No masses palpated. Extremities show no edema. neuro grossly intact  ECG sinus bradycardia at a rate of 55. No ST changes.

## 2011-10-10 NOTE — Patient Instructions (Signed)
Your physician wants you to follow-up in: 6 MONTHS You will receive a reminder letter in the mail two months in advance. If you don't receive a letter, please call our office to schedule the follow-up appointment.   Your physician recommends that you return for lab work in: TODAY  START HYDRALAZINE 25 MG ONE TABLET THREE TIMES DAILY

## 2011-10-10 NOTE — Assessment & Plan Note (Signed)
Blood pressure mildly elevated. Add hydralazine 25 mg by mouth 3 times a day and increase as needed. 

## 2011-10-10 NOTE — Assessment & Plan Note (Signed)
Patient remains in sinus rhythm. Continue tikosyn and Air traffic controller. Check CBC, BMET and MG.

## 2011-10-10 NOTE — Assessment & Plan Note (Signed)
Continue diet. Intolerant to statins. 

## 2011-10-10 NOTE — Assessment & Plan Note (Signed)
Continue present medications. No aspirin as she is on xeralto. Intolerant to statins.

## 2011-10-12 ENCOUNTER — Telehealth: Payer: Self-pay | Admitting: Cardiology

## 2011-10-12 NOTE — Telephone Encounter (Signed)
Fu call  °Pt returning your call about test results °

## 2011-10-12 NOTE — Telephone Encounter (Signed)
Patient aware of lab results.

## 2011-11-14 ENCOUNTER — Telehealth: Payer: Self-pay | Admitting: Cardiology

## 2011-11-14 NOTE — Telephone Encounter (Signed)
Hold xeralto 3 days prior to procedure and resume one day after [name]

## 2011-11-14 NOTE — Telephone Encounter (Signed)
No date set for tooth extraction, consult to discuss it is tomorrow.  One tooth being extracted.

## 2011-11-14 NOTE — Telephone Encounter (Signed)
Pt to have a consult tomorrow for tooth extraction pt on xarelto and tykosyn, and wants to know if any specail instructions prior to actual procedure

## 2011-11-15 ENCOUNTER — Telehealth: Payer: Self-pay | Admitting: Cardiology

## 2011-11-15 NOTE — Telephone Encounter (Signed)
Spoke with pt, aware of dr crenshaw's recommendations. 

## 2011-11-15 NOTE — Telephone Encounter (Signed)
Previous telephone note faxed to number provided regarding xeralto prior to tooth extraction

## 2011-11-15 NOTE — Telephone Encounter (Signed)
New problem:  Per Baird Lyons,. Pt having upcoming dental work please  advise on xaerlto.

## 2011-11-25 ENCOUNTER — Encounter: Payer: Self-pay | Admitting: Internal Medicine

## 2011-11-25 ENCOUNTER — Ambulatory Visit (INDEPENDENT_AMBULATORY_CARE_PROVIDER_SITE_OTHER): Payer: Medicare Other | Admitting: Internal Medicine

## 2011-11-25 DIAGNOSIS — F341 Dysthymic disorder: Secondary | ICD-10-CM

## 2011-11-25 DIAGNOSIS — I10 Essential (primary) hypertension: Secondary | ICD-10-CM

## 2011-11-25 DIAGNOSIS — M810 Age-related osteoporosis without current pathological fracture: Secondary | ICD-10-CM

## 2011-11-25 MED ORDER — ESCITALOPRAM OXALATE 5 MG PO TABS: 5.0000 mg | ORAL_TABLET | Freq: Every day | ORAL | Status: DC

## 2011-11-25 NOTE — Patient Instructions (Signed)
We will help schedule you for your bone density

## 2011-12-04 NOTE — Assessment & Plan Note (Signed)
Stop wean attempt. Take lexapro 5mg  po qd. Followup if no improvement or worsening.

## 2011-12-04 NOTE — Assessment & Plan Note (Signed)
Schedule bmd 

## 2011-12-04 NOTE — Progress Notes (Signed)
  Subjective:    Patient ID: [name], female    DOB: 01/31/1946, 66 y.o.   MRN: [id number]  HPI Pt presents to clinic for followup of multiple medical problems. Attempted to wean lexapro off but at 1/4 tab daily felt anxious. H/o right nephrectomy with last cr reviewed 1.3. Using tikosyn and xarelto under the direction of coumadin. No gross active bleeding. H/o htn and bp reviewed nl. H/o osteoporosis with no recent fx.   Past Medical History  Diagnosis Date  . CAD (coronary artery disease)   . Depression   . GERD (gastroesophageal reflux disease)   . Hyperlipidemia   . Hypertension   . Crohn's colitis     she reports ulcerative colitis beginning in her 43's, biopsies 2008 suggest Crohn's not UC  . Pseudoaneurysm of right femoral artery   . Pancreatitis   . Chronic sinusitis   . Renal oncocytoma     s/p right nephrectomy  . Myocardial infarction   . Uveitis   . Anxiety   . Obesity   . Chronic cough   . OSA (obstructive sleep apnea)   . Paroxysmal atrial fibrillation   . History of colonoscopy    Past Surgical History  Procedure Date  . Nephrectomy 10/2006    right secondary to oncocytoma   . Breast biopsy 1996    benign lesion   . Cholecystectomy 10/2006  . Tubal ligation   . Right renal artery repair 1976  . Right femoral  pseudoaneurysm & right groin hematoma evacuation 02/2007    reports that she has never smoked. She has never used smokeless tobacco. She reports that she does not drink alcohol or use illicit drugs. family history includes Allergies in her mother; Asthma in her daughter and maternal uncle; Clotting disorder in her brother, mother, and sister; Colitis in her father; Colon cancer in her paternal aunt; Coronary artery disease in her mother; Heart disease in her father and mother; Hypertension in her mother; and Stroke in her brother and mother. Allergies  Allergen Reactions  . Codeine   . Latex   . Mucilgen (Psyllium)   . Mucinex Nausea And  Vomiting and Other (See Comments)    headaches      Review of Systems see hpi     Objective:   Physical Exam  Nursing note and vitals reviewed. Constitutional: She appears well-developed and well-nourished. No distress.  HENT:  Head: Normocephalic and atraumatic.  Right Ear: External ear normal.  Left Ear: External ear normal.  Mouth/Throat: Oropharynx is clear and moist.  Eyes: Conjunctivae are normal. No scleral icterus.  Neck: Neck supple.  Cardiovascular: Normal rate, regular rhythm and normal heart sounds.  Exam reveals no gallop and no friction rub.   No murmur heard. Pulmonary/Chest: Effort normal and breath sounds normal. No respiratory distress. She has no wheezes. She has no rales.  Lymphadenopathy:    She has no cervical adenopathy.  Neurological: She is alert.  Skin: Skin is warm and dry. She is not diaphoretic.  Psychiatric: She has a normal mood and affect.          Assessment & Plan:

## 2011-12-04 NOTE — Assessment & Plan Note (Signed)
Normotensive and stable. Continue current regimen. Monitor bp as outpt and followup in clinic as scheduled.  

## 2011-12-05 ENCOUNTER — Other Ambulatory Visit: Payer: Medicare Other

## 2011-12-12 ENCOUNTER — Ambulatory Visit (INDEPENDENT_AMBULATORY_CARE_PROVIDER_SITE_OTHER)
Admission: RE | Admit: 2011-12-12 | Discharge: 2011-12-12 | Disposition: A | Discharge status: Home / Self Care | Payer: Medicare Other | Source: Ambulatory Visit

## 2011-12-12 DIAGNOSIS — M810 Age-related osteoporosis without current pathological fracture: Secondary | ICD-10-CM | POA: Diagnosis not present

## 2011-12-20 ENCOUNTER — Encounter: Payer: Self-pay | Admitting: Internal Medicine

## 2011-12-20 DIAGNOSIS — D485 Neoplasm of uncertain behavior of skin: Secondary | ICD-10-CM | POA: Diagnosis not present

## 2011-12-20 DIAGNOSIS — C44319 Basal cell carcinoma of skin of other parts of face: Secondary | ICD-10-CM | POA: Diagnosis not present

## 2011-12-22 ENCOUNTER — Ambulatory Visit: Payer: Medicare Other | Admitting: Pulmonary Disease

## 2012-01-25 ENCOUNTER — Telehealth: Payer: Self-pay | Admitting: Internal Medicine

## 2012-01-25 NOTE — Telephone Encounter (Signed)
Call placed to patient at [phone], no answer. A voice message was left for patient to return call with more detailed information regarding the issues with her nose.

## 2012-01-25 NOTE — Telephone Encounter (Signed)
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 She is having sx on 02/28/12 to remove this. She states that she has "issues" inside her nose and would like to know if she should see Dr. Rodena Medin before or after her sx.   Patient would like a call back after 10am on cell phone.

## 2012-01-25 NOTE — Telephone Encounter (Signed)
Can you check what the issues are?

## 2012-01-26 NOTE — Telephone Encounter (Signed)
Call returned to patient [phone] states she has a sore placed in her nose that has scabbed over with intermittent bleeding for the past month.She states she was seen by  Dr Nicanor Alcon who advised her  that she had a Polyp in her nose and she should have it looked into. She stated that she was evaluated by  Dr Annalee Genta her ENT, and was informed there was nothing seen to worry about. She is requesting a second opinion and evaluation by Dr Rodena Medin before she has surgery to remover layers of skin from her nose. She has scheduled for April 16 th, 2013 @ 10:15 am with Dr Rodena Medin.

## 2012-01-26 NOTE — Telephone Encounter (Signed)
Patient returned phone call and left voice message for a return phone call to [phone] regarding her nose issues.

## 2012-01-31 ENCOUNTER — Other Ambulatory Visit: Payer: Self-pay

## 2012-01-31 ENCOUNTER — Ambulatory Visit: Payer: Medicare Other | Admitting: Internal Medicine

## 2012-02-01 ENCOUNTER — Other Ambulatory Visit: Payer: Self-pay | Admitting: Internal Medicine

## 2012-02-01 MED ORDER — DOFETILIDE 125 MCG PO CAPS: 375.0000 ug | ORAL_CAPSULE | Freq: Two times a day (BID) | ORAL | Status: DC

## 2012-02-07 ENCOUNTER — Ambulatory Visit (INDEPENDENT_AMBULATORY_CARE_PROVIDER_SITE_OTHER): Payer: Medicare Other | Admitting: Internal Medicine

## 2012-02-07 ENCOUNTER — Encounter: Payer: Self-pay | Admitting: Internal Medicine

## 2012-02-07 VITALS — BP 108/68 | HR 56 | Temp 97.9°F | Resp 18 | Ht 64.0 in | Wt 233.0 lb

## 2012-02-07 DIAGNOSIS — C4491 Basal cell carcinoma of skin, unspecified: Secondary | ICD-10-CM | POA: Diagnosis not present

## 2012-02-07 DIAGNOSIS — J3489 Other specified disorders of nose and nasal sinuses: Secondary | ICD-10-CM

## 2012-02-07 DIAGNOSIS — J341 Cyst and mucocele of nose and nasal sinus: Secondary | ICD-10-CM

## 2012-02-07 NOTE — Progress Notes (Signed)
  Subjective:    Patient ID: [name], female    DOB: 01/23/1946, 66 y.o.   MRN: [id number]  HPI Pt presents to clinic for discussion of skin cancer. Followed by dermatology and has nose skin lesion. Derm attempted cryotherapy but after poor healing proceeded with bx and was found to be BCC. Is scheduled for Moh's surgery 02/27/12. Has questions about possible polyp in nose and if it is related to skin cancer. Recalls 9/12 head ct performed at ED- was told had polyp and believes it to be in the nasal passages. Report reviewed with pt to indicate presence of a small possible mucous retention cyst vs polyp located right maxillary sinus. Recent seen by ENT who inspect NP without evidence of lesion. Total time of visit ~28 minutes of which >50% spent in counseling.   Past Medical History  Diagnosis Date  . CAD (coronary artery disease)   . Depression   . GERD (gastroesophageal reflux disease)   . Hyperlipidemia   . Hypertension   . Crohn's colitis     she reports ulcerative colitis beginning in her 74's, biopsies 2008 suggest Crohn's not UC  . Pseudoaneurysm of right femoral artery   . Pancreatitis   . Chronic sinusitis   . Renal oncocytoma     s/p right nephrectomy  . Myocardial infarction   . Uveitis   . Anxiety   . Obesity   . Chronic cough   . OSA (obstructive sleep apnea)   . Paroxysmal atrial fibrillation   . History of colonoscopy    Past Surgical History  Procedure Date  . Nephrectomy 10/2006    right secondary to oncocytoma   . Breast biopsy 1996    benign lesion   . Cholecystectomy 10/2006  . Tubal ligation   . Right renal artery repair 1976  . Right femoral  pseudoaneurysm & right groin hematoma evacuation 02/2007    reports that she has never smoked. She has never used smokeless tobacco. She reports that she does not drink alcohol or use illicit drugs. family history includes Allergies in her mother; Asthma in her daughter and maternal uncle; Clotting disorder in  her brother, mother, and sister; Colitis in her father; Colon cancer in her paternal aunt; Coronary artery disease in her mother; Heart disease in her father and mother; Hypertension in her mother; and Stroke in her brother and mother. Allergies  Allergen Reactions  . Codeine   . Latex   . Mucilgen (Psyllium)   . Mucinex Nausea And Vomiting and Other (See Comments)    headaches     Review of Systems see hpi     Objective:   Physical Exam  Nursing note and vitals reviewed. Constitutional: She appears well-developed and well-nourished. No distress.  Skin: She is not diaphoretic.          Assessment & Plan:

## 2012-02-07 NOTE — Assessment & Plan Note (Signed)
Proceeding with surgical excision by dermatology. Discussed no relationship to sinus cyst/polyp

## 2012-02-23 ENCOUNTER — Other Ambulatory Visit: Payer: Self-pay

## 2012-02-23 MED ORDER — RIVAROXABAN 20 MG PO TABS: 1.0000 | ORAL_TABLET | Freq: Every day | ORAL | Status: DC

## 2012-02-23 NOTE — Telephone Encounter (Signed)
..   Requested Prescriptions   Signed Prescriptions Disp Refills  . Rivaroxaban (XARELTO) 20 MG TABS 30 tablet 4    Sig: Take 1 tablet by mouth daily.    Authorizing Provider: Lewayne Bunting    Ordering User: Christella Hartigan, Kerigan Narvaez Judie Petit

## 2012-02-28 DIAGNOSIS — C44319 Basal cell carcinoma of skin of other parts of face: Secondary | ICD-10-CM | POA: Diagnosis not present

## 2012-03-14 ENCOUNTER — Other Ambulatory Visit: Payer: Self-pay | Admitting: Cardiology

## 2012-03-14 DIAGNOSIS — I701 Atherosclerosis of renal artery: Secondary | ICD-10-CM

## 2012-03-21 ENCOUNTER — Encounter (INDEPENDENT_AMBULATORY_CARE_PROVIDER_SITE_OTHER): Payer: Medicare Other

## 2012-03-21 DIAGNOSIS — I701 Atherosclerosis of renal artery: Secondary | ICD-10-CM

## 2012-03-21 DIAGNOSIS — I700 Atherosclerosis of aorta: Secondary | ICD-10-CM

## 2012-03-26 ENCOUNTER — Telehealth: Payer: Self-pay | Admitting: Cardiology

## 2012-03-26 ENCOUNTER — Ambulatory Visit: Payer: Medicare Other | Admitting: Internal Medicine

## 2012-03-26 DIAGNOSIS — Z0289 Encounter for other administrative examinations: Secondary | ICD-10-CM

## 2012-03-26 NOTE — Telephone Encounter (Signed)
New problem:  Patient calling regarding test results.  

## 2012-03-26 NOTE — Telephone Encounter (Signed)
Pt notified of renal US results.

## 2012-05-11 ENCOUNTER — Ambulatory Visit (INDEPENDENT_AMBULATORY_CARE_PROVIDER_SITE_OTHER): Payer: Medicare Other | Admitting: Internal Medicine

## 2012-05-11 ENCOUNTER — Encounter: Payer: Self-pay | Admitting: Internal Medicine

## 2012-05-11 VITALS — BP 128/84 | HR 68 | Temp 98.4°F | Resp 16 | Wt 234.2 lb

## 2012-05-11 DIAGNOSIS — J329 Chronic sinusitis, unspecified: Secondary | ICD-10-CM

## 2012-05-11 MED ORDER — AMOXICILLIN-POT CLAVULANATE 875-125 MG PO TABS: 1.0000 | ORAL_TABLET | Freq: Two times a day (BID) | ORAL | Status: DC

## 2012-05-11 NOTE — Progress Notes (Signed)
  Subjective:    Patient ID: [name], female    DOB: 12-30-1945, 66 y.o.   MRN: [id number]  HPI Pt presents to clinic for evaluation of URI sx's. Notes 1.5wk h/o frontal sinus pressure, green nasal discharge and cough. No f/c. No alleviating or exacerbating factors. Taking no medication for the problem.   Past Medical History  Diagnosis Date  . CAD (coronary artery disease)   . Depression   . GERD (gastroesophageal reflux disease)   . Hyperlipidemia   . Hypertension   . Crohn's colitis     she reports ulcerative colitis beginning in her 66's, biopsies 2008 suggest Crohn's not UC  . Pseudoaneurysm of right femoral artery   . Pancreatitis   . Chronic sinusitis   . Renal oncocytoma     s/p right nephrectomy  . Myocardial infarction   . Uveitis   . Anxiety   . Obesity   . Chronic cough   . OSA (obstructive sleep apnea)   . Paroxysmal atrial fibrillation   . History of colonoscopy    Past Surgical History  Procedure Date  . Nephrectomy 10/2006    right secondary to oncocytoma   . Breast biopsy 1996    benign lesion   . Cholecystectomy 10/2006  . Tubal ligation   . Right renal artery repair 1976  . Right femoral  pseudoaneurysm & right groin hematoma evacuation 02/2007    reports that she has never smoked. She has never used smokeless tobacco. She reports that she does not drink alcohol or use illicit drugs. family history includes Allergies in her mother; Asthma in her daughter and maternal uncle; Clotting disorder in her brother, mother, and sister; Colitis in her father; Colon cancer in her paternal aunt; Coronary artery disease in her mother; Heart disease in her father and mother; Hypertension in her mother; and Stroke in her brother and mother. Allergies  Allergen Reactions  . Codeine   . Guaifenesin Er Nausea And Vomiting and Other (See Comments)    headaches  . Latex   . Mucilgen (Psyllium)      Review of Systems see hpi     Objective:   Physical Exam    Nursing note and vitals reviewed. Constitutional: She appears well-developed and well-nourished. No distress.  HENT:  Head: Normocephalic and atraumatic.  Right Ear: External ear normal.  Left Ear: External ear normal.  Nose: Right sinus exhibits no maxillary sinus tenderness and no frontal sinus tenderness. Left sinus exhibits frontal sinus tenderness. Left sinus exhibits no maxillary sinus tenderness.  Mouth/Throat: Oropharynx is clear and moist. No oropharyngeal exudate.  Eyes: Conjunctivae and EOM are normal. No scleral icterus.  Neck: Neck supple.  Cardiovascular: Normal rate, regular rhythm and normal heart sounds.   Pulmonary/Chest: Effort normal and breath sounds normal. No respiratory distress. She has no wheezes. She has no rales.  Neurological: She is alert.  Skin: Skin is warm and dry. She is not diaphoretic.  Psychiatric: She has a normal mood and affect.          Assessment & Plan:

## 2012-05-12 DIAGNOSIS — J329 Chronic sinusitis, unspecified: Secondary | ICD-10-CM | POA: Insufficient documentation

## 2012-05-12 NOTE — Assessment & Plan Note (Signed)
Begin abx course. Followup if no improvement or worsening. 

## 2012-05-22 ENCOUNTER — Ambulatory Visit (INDEPENDENT_AMBULATORY_CARE_PROVIDER_SITE_OTHER): Payer: Medicare Other | Admitting: Family

## 2012-05-22 ENCOUNTER — Encounter: Payer: Self-pay | Admitting: Family

## 2012-05-22 VITALS — BP 120/62 | HR 70 | Temp 97.4°F | Resp 18 | Wt 235.1 lb

## 2012-05-22 DIAGNOSIS — B373 Candidiasis of vulva and vagina: Secondary | ICD-10-CM

## 2012-05-22 MED ORDER — FLUCONAZOLE 150 MG PO TABS: ORAL_TABLET | ORAL | Status: DC

## 2012-05-22 NOTE — Patient Instructions (Addendum)
Monilial Vaginitis Vaginitis in a soreness, swelling and redness (inflammation) of the vagina and vulva. Monilial vaginitis is not a sexually transmitted infection. CAUSES  Yeast vaginitis is caused by yeast (candida) that is normally found in your vagina. With a yeast infection, the candida has overgrown in number to a point that upsets the chemical balance. SYMPTOMS   White, thick vaginal discharge.   Swelling, itching, redness and irritation of the vagina and possibly the lips of the vagina (vulva).   Burning or painful urination.   Painful intercourse.  DIAGNOSIS  Things that may contribute to monilial vaginitis are:  Postmenopausal and virginal states.   Pregnancy.   Infections.   Being tired, sick or stressed, especially if you had monilial vaginitis in the past.   Diabetes. Good control will help lower the chance.   Birth control pills.   Tight fitting garments.   Using bubble bath, feminine sprays, douches or deodorant tampons.   Taking certain medications that kill germs (antibiotics).   Sporadic recurrence can occur if you become ill.  TREATMENT  Your caregiver will give you medication.  There are several kinds of anti monilial vaginal creams and suppositories specific for monilial vaginitis. For recurrent yeast infections, use a suppository or cream in the vagina 2 times a week, or as directed.   Anti-monilial or steroid cream for the itching or irritation of the vulva may also be used. Get your caregiver's permission.   Painting the vagina with methylene blue solution may help if the monilial cream does not work.   Eating yogurt may help prevent monilial vaginitis.  HOME CARE INSTRUCTIONS   Finish all medication as prescribed.   Do not have sex until treatment is completed or after your caregiver tells you it is okay.   Take warm sitz baths.   Do not douche.   Do not use tampons, especially scented ones.   Wear cotton underwear.   Avoid tight  pants and panty hose.   Tell your sexual partner that you have a yeast infection. They should go to their caregiver if they have symptoms such as mild rash or itching.   Your sexual partner should be treated as well if your infection is difficult to eliminate.   Practice safer sex. Use condoms.   Some vaginal medications cause latex condoms to fail. Vaginal medications that harm condoms are:   Cleocin cream.   Butoconazole (Femstat).   Terconazole (Terazol) vaginal suppository.   Miconazole (Monistat) (may be purchased over the counter).  SEEK MEDICAL CARE IF:   You have a temperature by mouth above 102 F (38.9 C).   The infection is getting worse after 2 days of treatment.   The infection is not getting better after 3 days of treatment.   You develop blisters in or around your vagina.   You develop vaginal bleeding, and it is not your menstrual period.   You have pain when you urinate.   You develop intestinal problems.   You have pain with sexual intercourse.  Document Released: 07/20/2005 Document Revised: 09/29/2011 Document Reviewed: 04/03/2009 [employer] Patient Information 2012 [employer]. 

## 2012-05-22 NOTE — Progress Notes (Signed)
Subjective:    Patient ID: [name], female    DOB: 22-Jan-1946, 66 y.o.   MRN: [id number]  HPI  Pt presents with complaint of vaginal itching since Saturday.  Recently treated with abx for sinus infection. She completed abx 3 days ago.  She reports few spots of "bright red blood" on her underwear which she felt was due to skin being "raw."  Does not equate this bleeding to vaginal or menstrual bleeding.    Review of Systems See HPI  Past Medical History  Diagnosis Date  . CAD (coronary artery disease)   . Depression   . GERD (gastroesophageal reflux disease)   . Hyperlipidemia   . Hypertension   . Crohn's colitis     she reports ulcerative colitis beginning in her 72's, biopsies 2008 suggest Crohn's not UC  . Pseudoaneurysm of right femoral artery   . Pancreatitis   . Chronic sinusitis   . Renal oncocytoma     s/p right nephrectomy  . Myocardial infarction   . Uveitis   . Anxiety   . Obesity   . Chronic cough   . OSA (obstructive sleep apnea)   . Paroxysmal atrial fibrillation   . History of colonoscopy     History   Social History  . Marital Status: Married    Spouse Name: N/A    Number of Children: 3  . Years of Education: N/A   Occupational History  .      retired   Social History Main Topics  . Smoking status: Never Smoker   . Smokeless tobacco: Never Used  . Alcohol Use: No  . Drug Use: No  . Sexually Active: Not Currently   Other Topics Concern  . 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 Nephrectomy 10/2006    right secondary to oncocytoma   . Breast biopsy 1996    benign lesion   . Cholecystectomy 10/2006  . Tubal ligation   . Right renal artery repair 1976  . Right femoral  pseudoaneurysm & right groin hematoma evacuation 02/2007    Family History  Problem Relation  Age of Onset  . Colitis Father   . Hypertension Mother   . Coronary artery disease Mother   . Stroke Mother   . Stroke Brother   . Colon cancer Paternal Aunt   . Clotting disorder Mother   . Clotting disorder Brother   . Clotting disorder Sister   . Heart disease Mother   . Heart disease Father   . Asthma Daughter   . Allergies Mother   . Asthma Maternal Uncle     Allergies  Allergen Reactions  . Codeine   . Guaifenesin Er Nausea And Vomiting and Other (See Comments)    headaches  . Latex   . Mucilgen (Psyllium)     Current Outpatient Prescriptions on File Prior to Visit  Medication Sig Dispense Refill  . amLODipine (NORVASC) 10 MG tablet Take 1 tablet (10 mg total) by mouth daily.  30 tablet  11  . ammonium lactate (LAC-HYDRIN) 12 % lotion as needed.      . dofetilide (TIKOSYN) 125 MCG capsule Take 3 capsules (375 mcg total) by mouth 2 (two) times daily.  180 capsule  5  . escitalopram (LEXAPRO) 5 MG tablet Take 1 tablet (5 mg total) by  mouth daily.  30 tablet  11  . esomeprazole (NEXIUM) 40 MG capsule Take 1 capsule (40 mg total) by mouth 2 (two) times daily.  60 capsule  12  . Glucosamine-Chondroitin (GLUCOSAMINE CHONDR COMPLEX PO) Take by mouth. 1 daily        . loperamide (IMODIUM A-D) 2 MG tablet Take 2 mg by mouth as needed. Prn half tablet      . losartan (COZAAR) 100 MG tablet Take 1 tablet (100 mg total) by mouth daily.  30 tablet  11  . mesalamine (APRISO) 0.375 G 24 hr capsule Take 4 capsules (1.5 g total) by mouth daily.  120 capsule  5  . metoprolol tartrate (LOPRESSOR) 25 MG tablet TAKE 1/2 TABLET TWICE DAILY  30 tablet  12  . Multiple Vitamin (MULTIVITAMIN) capsule Take 1 capsule by mouth daily. 1 daily        . nitroGLYCERIN (NITROSTAT) 0.4 MG SL tablet Place 0.4 mg under the tongue every 5 (five) minutes as needed. 1 tab under tongue every 5 min for chest pain not to exceed 3 tabs in 15 min       . Probiotic Product (PHILLIPS COLON HEALTH) CAPS Take by  mouth. 1 per day       . Rivaroxaban (XARELTO) 20 MG TABS Take 1 tablet by mouth daily.  30 tablet  4    BP 120/62  Pulse 70  Temp 97.4 F (36.3 C) (Oral)  Resp 18  Wt 235 lb 1.9 oz (106.65 kg)  SpO2 95%       Objective:   Physical Exam  Constitutional: She appears well-developed and well-nourished. No distress.  Cardiovascular: Normal rate and regular rhythm.   No murmur heard. Psychiatric: She has a normal mood and affect. Her behavior is normal. Judgment and thought content normal.          Assessment & Plan:

## 2012-05-22 NOTE — Assessment & Plan Note (Signed)
Will rx with diflucan.  If no improvement, will need wet prep.  She reports no pap in 8 yrs, recommended that she schedule a routine GYN examination.

## 2012-05-25 ENCOUNTER — Encounter (HOSPITAL_BASED_OUTPATIENT_CLINIC_OR_DEPARTMENT_OTHER): Payer: Self-pay | Admitting: *Deleted

## 2012-05-25 ENCOUNTER — Emergency Department (HOSPITAL_BASED_OUTPATIENT_CLINIC_OR_DEPARTMENT_OTHER): Payer: Medicare Other

## 2012-05-25 ENCOUNTER — Emergency Department (HOSPITAL_BASED_OUTPATIENT_CLINIC_OR_DEPARTMENT_OTHER)
Admission: EM | Admit: 2012-05-25 | Discharge: 2012-05-25 | Disposition: A | Discharge status: Home / Self Care | Payer: Medicare Other | Attending: Emergency Medicine | Admitting: Emergency Medicine

## 2012-05-25 DIAGNOSIS — R110 Nausea: Secondary | ICD-10-CM | POA: Insufficient documentation

## 2012-05-25 DIAGNOSIS — F3289 Other specified depressive episodes: Secondary | ICD-10-CM | POA: Insufficient documentation

## 2012-05-25 DIAGNOSIS — F329 Major depressive disorder, single episode, unspecified: Secondary | ICD-10-CM | POA: Diagnosis not present

## 2012-05-25 DIAGNOSIS — K219 Gastro-esophageal reflux disease without esophagitis: Secondary | ICD-10-CM | POA: Insufficient documentation

## 2012-05-25 DIAGNOSIS — G4733 Obstructive sleep apnea (adult) (pediatric): Secondary | ICD-10-CM | POA: Diagnosis not present

## 2012-05-25 DIAGNOSIS — J189 Pneumonia, unspecified organism: Secondary | ICD-10-CM | POA: Insufficient documentation

## 2012-05-25 DIAGNOSIS — I10 Essential (primary) hypertension: Secondary | ICD-10-CM | POA: Diagnosis not present

## 2012-05-25 DIAGNOSIS — I2510 Atherosclerotic heart disease of native coronary artery without angina pectoris: Secondary | ICD-10-CM | POA: Diagnosis not present

## 2012-05-25 DIAGNOSIS — F411 Generalized anxiety disorder: Secondary | ICD-10-CM | POA: Diagnosis not present

## 2012-05-25 DIAGNOSIS — I252 Old myocardial infarction: Secondary | ICD-10-CM | POA: Diagnosis not present

## 2012-05-25 DIAGNOSIS — R51 Headache: Secondary | ICD-10-CM | POA: Diagnosis not present

## 2012-05-25 DIAGNOSIS — I4891 Unspecified atrial fibrillation: Secondary | ICD-10-CM | POA: Insufficient documentation

## 2012-05-25 DIAGNOSIS — R918 Other nonspecific abnormal finding of lung field: Secondary | ICD-10-CM | POA: Diagnosis not present

## 2012-05-25 LAB — URINALYSIS, ROUTINE W REFLEX MICROSCOPIC
Glucose, UA: NEGATIVE mg/dL
Leukocytes, UA: NEGATIVE
Protein, ur: NEGATIVE mg/dL
pH: 5.5 (ref 5.0–8.0)

## 2012-05-25 LAB — CBC WITH DIFFERENTIAL/PLATELET
Basophils Absolute: 0 10*3/uL (ref 0.0–0.1)
Basophils Relative: 0 % (ref 0–1)
Eosinophils Relative: 1 % (ref 0–5)
HCT: 38.5 % (ref 36.0–46.0)
MCHC: 33 g/dL (ref 30.0–36.0)
MCV: 91.9 fL (ref 78.0–100.0)
Monocytes Absolute: 0.7 10*3/uL (ref 0.1–1.0)
RDW: 13.2 % (ref 11.5–15.5)

## 2012-05-25 LAB — COMPREHENSIVE METABOLIC PANEL
AST: 19 U/L (ref 0–37)
Albumin: 3.4 g/dL — ABNORMAL LOW (ref 3.5–5.2)
Calcium: 9.5 mg/dL (ref 8.4–10.5)
Creatinine, Ser: 1 mg/dL (ref 0.50–1.10)
GFR calc non Af Amer: 57 mL/min — ABNORMAL LOW (ref >90)

## 2012-05-25 LAB — LACTIC ACID, PLASMA: Lactic Acid, Venous: 1 mmol/L (ref 0.5–2.2)

## 2012-05-25 MED ORDER — AZITHROMYCIN 250 MG PO TABS
500.0000 mg | ORAL_TABLET | Freq: Once | ORAL | Status: AC
  Administered 2012-05-25: 500 mg via ORAL
  Filled 2012-05-25: qty 2

## 2012-05-25 MED ORDER — ONDANSETRON HCL 4 MG/2ML IJ SOLN
4.0000 mg | Freq: Once | INTRAMUSCULAR | Status: AC
  Administered 2012-05-25: 4 mg via INTRAVENOUS
  Filled 2012-05-25: qty 2

## 2012-05-25 MED ORDER — SODIUM CHLORIDE 0.9 % IV BOLUS (SEPSIS)
1000.0000 mL | Freq: Once | INTRAVENOUS | Status: AC
  Administered 2012-05-25: 1000 mL via INTRAVENOUS

## 2012-05-25 MED ORDER — ONDANSETRON 8 MG PO TBDP: 8.0000 mg | ORAL_TABLET | Freq: Three times a day (TID) | ORAL | Status: AC | PRN

## 2012-05-25 MED ORDER — ACETAMINOPHEN 325 MG PO TABS
650.0000 mg | ORAL_TABLET | Freq: Once | ORAL | Status: AC
  Administered 2012-05-25: 650 mg via ORAL
  Filled 2012-05-25: qty 2

## 2012-05-25 MED ORDER — AZITHROMYCIN 250 MG PO TABS: 250.0000 mg | ORAL_TABLET | Freq: Every day | ORAL | Status: AC

## 2012-05-25 MED ORDER — CEFDINIR 300 MG PO CAPS: 300.0000 mg | ORAL_CAPSULE | Freq: Two times a day (BID) | ORAL | Status: AC

## 2012-05-25 NOTE — ED Notes (Signed)
Pt requesting tylenol for headache. EDP aware and orders received.

## 2012-05-25 NOTE — ED Notes (Signed)
Dr. Hunt at bedside.

## 2012-05-25 NOTE — ED Notes (Signed)
Levaquin cancelled and new order for Augmentin 875-125mg   PO BID x 7 days per Henrene Dodge PA-C at PharmD's request.  Pt is on Tikosyn and cannot take Levaquin.  This Augmenting rx was called into [facility] in [place] at [phone]

## 2012-05-25 NOTE — ED Provider Notes (Signed)
History     CSN: [id number]  Arrival date & time 05/25/12  0520   First MD Initiated Contact with Patient 05/25/12 0534      Chief Complaint  Patient presents with  . Abdominal Pain    (Consider location/radiation/quality/duration/timing/severity/associated sxs/prior treatment) HPI Patient is a 66 year old female who presents today complaining of bilateral upper quadrant abdominal pain that began around 8 PM after eating a yogurt. Patient has had similar pain in the past that was associated with pancreatitis. At that time patient was noted to have gallbladder disease and underwent cholecystectomy. She rates her pain as a sharp 2/10 but endorses significant amounts of nausea with this. She denies any radiation of her pain. She denies vomiting, constipation, blood in her stools, urinary symptoms, chest pain, shortness of breath, cough. Patient reports low-grade fever which she reports is in the 99s. Patient has history of heart disease but reports that her symptoms feel nothing like that. She denies any sick contacts. Patient did endorse several episodes of diarrhea. Of note she has recently been treated with Augmentin for bronchitis. She also developed symptoms of a yeast infection and was treated for this after taking Augmentin. There are no other associated or modifying factors. Past Medical History  Diagnosis Date  . CAD (coronary artery disease)   . Depression   . GERD (gastroesophageal reflux disease)   . Hyperlipidemia   . Hypertension   . Crohn's colitis     she reports ulcerative colitis beginning in her 22's, biopsies 2008 suggest Crohn's not UC  . Pseudoaneurysm of right femoral artery   . Pancreatitis   . Chronic sinusitis   . Renal oncocytoma     s/p right nephrectomy  . Myocardial infarction   . Uveitis   . Anxiety   . Obesity   . Chronic cough   . OSA (obstructive sleep apnea)   . Paroxysmal atrial fibrillation   . History of colonoscopy     Past Surgical History    Procedure Date  . Nephrectomy 10/2006    right secondary to oncocytoma   . Breast biopsy 1996    benign lesion   . Cholecystectomy 10/2006  . Tubal ligation   . Right renal artery repair 1976  . Right femoral  pseudoaneurysm & right groin hematoma evacuation 02/2007    Family History  Problem Relation Age of Onset  . Colitis Father   . Hypertension Mother   . Coronary artery disease Mother   . Stroke Mother   . Stroke Brother   . Colon cancer Paternal Aunt   . Clotting disorder Mother   . Clotting disorder Brother   . Clotting disorder Sister   . Heart disease Mother   . Heart disease Father   . Asthma Daughter   . Allergies Mother   . Asthma Maternal Uncle     History  Substance Use Topics  . Smoking status: Never Smoker   . Smokeless tobacco: Never Used  . Alcohol Use: No    OB History    Grav Para Term Preterm Abortions TAB SAB Ect Mult Living                  Review of Systems  Constitutional: Positive for fever.  HENT: Negative.   Eyes: Negative.   Respiratory: Negative.   Cardiovascular: Negative.   Gastrointestinal: Positive for nausea, abdominal pain and diarrhea. Negative for vomiting, constipation and blood in stool.  Genitourinary: Negative.   Musculoskeletal: Negative.   Skin: Negative.  Neurological: Negative.   Hematological: Negative.   Psychiatric/Behavioral: Negative.   All other systems reviewed and are negative.    Allergies  Codeine; Guaifenesin er; Latex; and Mucilgen  Home Medications   Current Outpatient Rx  Name Route Sig Dispense Refill  . AMLODIPINE BESYLATE 10 MG PO TABS Oral Take 1 tablet (10 mg total) by mouth daily. 30 tablet 11  . AMMONIUM LACTATE 12 % EX LOTN  as needed.    . DOFETILIDE 125 MCG PO CAPS Oral Take 3 capsules (375 mcg total) by mouth 2 (two) times daily. 180 capsule 5  . ESCITALOPRAM OXALATE 5 MG PO TABS Oral Take 1 tablet (5 mg total) by mouth daily. 30 tablet 11  . ESOMEPRAZOLE MAGNESIUM 40 MG PO  CPDR Oral Take 1 capsule (40 mg total) by mouth 2 (two) times daily. 60 capsule 12  . FLUCONAZOLE 150 MG PO TABS  Take one tab today, may repeat in 3 days as need for vaginal itching 2 tablet 0  . GLUCOSAMINE CHONDR COMPLEX PO Oral Take by mouth. 1 daily      . LOPERAMIDE HCL 2 MG PO TABS Oral Take 2 mg by mouth as needed. Prn half tablet    . LOSARTAN POTASSIUM 100 MG PO TABS Oral Take 1 tablet (100 mg total) by mouth daily. 30 tablet 11  . MESALAMINE ER 0.375 G PO CP24 Oral Take 4 capsules (1.5 g total) by mouth daily. 120 capsule 5  . METOPROLOL TARTRATE 25 MG PO TABS  TAKE 1/2 TABLET TWICE DAILY 30 tablet 12  . MULTIVITAMINS PO CAPS Oral Take 1 capsule by mouth daily. 1 daily      . NITROGLYCERIN 0.4 MG SL SUBL Sublingual Place 0.4 mg under the tongue every 5 (five) minutes as needed. 1 tab under tongue every 5 min for chest pain not to exceed 3 tabs in 15 min     . PHILLIPS COLON HEALTH PO CAPS Oral Take by mouth. 1 per day     . RIVAROXABAN 20 MG PO TABS Oral Take 1 tablet by mouth daily. 30 tablet 4    BP 180/70  Pulse 72  Temp 98.3 F (36.8 C) (Oral)  Resp 18  Ht 5\' 5"  (1.651 m)  Wt 235 lb (106.595 kg)  BMI 39.11 kg/m2  SpO2 99%  Physical Exam  Nursing note and vitals reviewed. GEN: Well-developed, well-nourished female in no distress HEENT: Atraumatic, normocephalic. Oropharynx clear without erythema EYES: PERRLA BL, no scleral icterus. NECK: Trachea midline, no meningismus CV: regular rate and rhythm. No murmurs, rubs, or gallops PULM: No respiratory distress.  No crackles, wheezes, or rales. GI: soft, mild bilateral upper quadrant tenderness to palpation. No guarding, rebound. + bowel sounds  GU: deferred Neuro: cranial nerves grossly 2-12 intact, no abnormalities of strength or sensation, A and O x 3 MSK: Patient moves all 4 extremities symmetrically, no deformity, edema, or injury noted Skin: No rashes petechiae, purpura, or jaundice Psych: no abnormality of  mood   ED Course  Procedures (including critical care time)  Indication: epigastric pain w/ h/o CAD Please note this EKG was reviewed extemporaneously by myself.   Date: 05/25/2012  Rate: 67  Rhythm: normal sinus rhythm  QRS Axis: normal  Intervals: normal  ST/T Wave abnormalities: normal  Conduction Disutrbances:none  Narrative Interpretation:   Old EKG Reviewed: unchanged       Labs Reviewed  URINALYSIS, ROUTINE W REFLEX MICROSCOPIC  CBC WITH DIFFERENTIAL  COMPREHENSIVE METABOLIC PANEL  LIPASE, BLOOD  No results found.   No diagnosis found.    MDM  Patient was evaluated by myself. Given her presentation I was concerned about possible recurrence of her pancreatitis that she had had in the past. Laboratory workup for her abdominal pain was performed. Urinalysis returned within normal limits. Given patient's history of heart disease an EKG was also performed. This was negative for any significant ischemic changes. Patient denied this being an anginal equivalent for her and she denied any chest pain. No further workup was thought to be necessary for possible ACS based on presentation. Patient declined any pain medication but was given nausea medication and IV fluids initially. She then requested Tylenol for headache which was provided. Patient's workup is pending at this time. Patient denied any cough but given recent treatment for bronchitis chest x-ray was performed to evaluate for possible occult pneumonia. Patient with CAP on CXR.  Lactate within normal limits.  Patient with significant improvement.  Antibiotic choice d/w pharmacy given xarelto and dofetilide.  Patient given azithro and cefdinir per recommendations.  Patient to follow-up with PCP and return for other emergent concerns.  Also given Rx for zofran as needed.        Cyndra Numbers, MD 05/25/12 [phone]

## 2012-05-25 NOTE — ED Notes (Signed)
Pt c/o upper abdominal pains that began at 8pm last night. +nausea but denies vomiting. Pt has had 6-8 episodes of diarrhea. C/o chills x15 days with low grade fever. Denies CP or SOB.

## 2012-05-25 NOTE — ED Notes (Signed)
Pharmacy called and requested change of Rx: Omnicef. Stated last time patient had it, she went into A-fib. Rx changed to Levaquin 750 mg #7. 1 q D until gone. Rx changed by Gray Bernhardt MD. Rx called to pharmacy by Jaci Lazier PFM.

## 2012-05-31 ENCOUNTER — Telehealth: Payer: Self-pay | Admitting: Internal Medicine

## 2012-05-31 NOTE — Telephone Encounter (Signed)
Per Vo TWH, patient informed to take Tylenol for H/A [safest w/blood thinner Rx] and OTC antihistamine for sinus blockage [cetirizine, allegra, loratadine] until she comes in 08.12.13 for A&E OV; pt understood and agreed/SLS

## 2012-05-31 NOTE — Telephone Encounter (Signed)
Patient states that she was seen in the ED for pneumonia and has been taking zithromax and augmentin since. She states that she is not feeling any better. She still has headaches and stuffiness? She has a ED follow up on Monday but wants to know if there is something else that she can take or do?

## 2012-06-04 ENCOUNTER — Ambulatory Visit (INDEPENDENT_AMBULATORY_CARE_PROVIDER_SITE_OTHER): Payer: Medicare Other | Admitting: Internal Medicine

## 2012-06-04 ENCOUNTER — Encounter: Payer: Self-pay | Admitting: Internal Medicine

## 2012-06-04 VITALS — BP 134/86 | HR 60 | Temp 97.7°F | Resp 14 | Ht 63.25 in | Wt 233.5 lb

## 2012-06-04 DIAGNOSIS — J309 Allergic rhinitis, unspecified: Secondary | ICD-10-CM | POA: Diagnosis not present

## 2012-06-04 DIAGNOSIS — D72829 Elevated white blood cell count, unspecified: Secondary | ICD-10-CM

## 2012-06-04 NOTE — Assessment & Plan Note (Signed)
Attempt otc AH daily. Followup if no improvement or worsening.

## 2012-06-04 NOTE — Progress Notes (Signed)
  Subjective:    Patient ID: [name], female    DOB: 1946-10-15, 66 y.o.   MRN: [id number]  HPI Pt presents to clinic for follow up of possible pneumonia. S/p ED visit with c/o bilateral upper abdominal pain and diarrhea. Labs significant for nl lipase and leukocytosis of 17k. CXR suggested retrocardiac density ?atelectasis vs pneumonia. Has not noted cough, dyspnea or fever. Does c/o clear nasal drainage down throat. Taking otc AH prn.   Past Medical History  Diagnosis Date  . CAD (coronary artery disease)   . Depression   . GERD (gastroesophageal reflux disease)   . Hyperlipidemia   . Hypertension   . Crohn's colitis     she reports ulcerative colitis beginning in her 41's, biopsies 2008 suggest Crohn's not UC  . Pseudoaneurysm of right femoral artery   . Pancreatitis   . Chronic sinusitis   . Renal oncocytoma     s/p right nephrectomy  . Myocardial infarction   . Uveitis   . Anxiety   . Obesity   . Chronic cough   . OSA (obstructive sleep apnea)   . Paroxysmal atrial fibrillation   . History of colonoscopy    Past Surgical History  Procedure Date  . Nephrectomy 10/2006    right secondary to oncocytoma   . Breast biopsy 1996    benign lesion   . Cholecystectomy 10/2006  . Tubal ligation   . Right renal artery repair 1976  . Right femoral  pseudoaneurysm & right groin hematoma evacuation 02/2007    reports that she has never smoked. She has never used smokeless tobacco. She reports that she does not drink alcohol or use illicit drugs. family history includes Allergies in her mother; Asthma in her daughter and maternal uncle; Clotting disorder in her brother, mother, and sister; Colitis in her father; Colon cancer in her paternal aunt; Coronary artery disease in her mother; Heart disease in her father and mother; Hypertension in her mother; and Stroke in her brother and mother. Allergies  Allergen Reactions  . Cefdinir     Pt cannot take; may interfere with AFib.  .  Codeine   . Guaifenesin Er Nausea And Vomiting and Other (See Comments)    Headaches; can tolerate liquid.  . Latex   . Mucilgen (Psyllium)      Review of Systems see hpi     Objective:   Physical Exam  Nursing note and vitals reviewed. Constitutional: She appears well-developed and well-nourished. No distress.  HENT:  Head: Normocephalic and atraumatic.  Right Ear: External ear normal.  Left Ear: External ear normal.  Mouth/Throat: Oropharynx is clear and moist. No oropharyngeal exudate.  Eyes: Conjunctivae are normal. No scleral icterus.  Neck: Neck supple.  Cardiovascular: Normal rate, regular rhythm and normal heart sounds.   Pulmonary/Chest: Effort normal and breath sounds normal. No respiratory distress. She has no wheezes. She has no rales.  Neurological: She is alert.  Skin: Skin is warm and dry. She is not diaphoretic.  Psychiatric: She has a normal mood and affect.          Assessment & Plan:

## 2012-06-04 NOTE — Assessment & Plan Note (Signed)
Repeat CBC. Abdominal pain and diarrhea resolved.

## 2012-06-05 LAB — CBC WITH DIFFERENTIAL/PLATELET
Basophils Absolute: 0 10*3/uL (ref 0.0–0.1)
Basophils Relative: 0 % (ref 0–1)
Hemoglobin: 13.1 g/dL (ref 12.0–15.0)
MCHC: 32.3 g/dL (ref 30.0–36.0)
Monocytes Relative: 7 % (ref 3–12)
Neutro Abs: 6.3 10*3/uL (ref 1.7–7.7)
Neutrophils Relative %: 67 % (ref 43–77)
Platelets: 284 10*3/uL (ref 150–400)

## 2012-06-20 ENCOUNTER — Ambulatory Visit (INDEPENDENT_AMBULATORY_CARE_PROVIDER_SITE_OTHER): Payer: Medicare Other | Admitting: Cardiology

## 2012-06-20 ENCOUNTER — Encounter: Payer: Self-pay | Admitting: Cardiology

## 2012-06-20 VITALS — BP 169/87 | HR 66 | Ht 63.0 in | Wt 235.0 lb

## 2012-06-20 DIAGNOSIS — I2510 Atherosclerotic heart disease of native coronary artery without angina pectoris: Secondary | ICD-10-CM

## 2012-06-20 DIAGNOSIS — I10 Essential (primary) hypertension: Secondary | ICD-10-CM | POA: Diagnosis not present

## 2012-06-20 DIAGNOSIS — I4891 Unspecified atrial fibrillation: Secondary | ICD-10-CM

## 2012-06-20 DIAGNOSIS — E785 Hyperlipidemia, unspecified: Secondary | ICD-10-CM

## 2012-06-20 DIAGNOSIS — R079 Chest pain, unspecified: Secondary | ICD-10-CM

## 2012-06-20 LAB — MAGNESIUM: Magnesium: 2.1 mg/dL (ref 1.5–2.5)

## 2012-06-20 MED ORDER — HYDRALAZINE HCL 25 MG PO TABS: 25.0000 mg | ORAL_TABLET | Freq: Three times a day (TID) | ORAL | Status: DC

## 2012-06-20 NOTE — Patient Instructions (Addendum)
Your physician wants you to follow-up in: 9 MONTHS WITH DR Jens Som You will receive a reminder letter in the mail two months in advance. If you don't receive a letter, please call our office to schedule the follow-up appointment.   Your physician recommends that you HAVE LAB WORK TODAY  START HYDRALAZINE 25 MG THREE TIMES DAILY

## 2012-06-20 NOTE — Assessment & Plan Note (Signed)
Patient remains in sinus rhythm. Continue tikoysn and [employer]. Recent laboratories showed normal potassium, renal function and hemoglobin. Check magnesium.

## 2012-06-20 NOTE — Assessment & Plan Note (Signed)
Blood pressure elevated. Add hydralazine 25 mg by mouth 3 times a day. 

## 2012-06-20 NOTE — Assessment & Plan Note (Signed)
Continue diet. Intolerant to statins. 

## 2012-06-20 NOTE — Assessment & Plan Note (Signed)
Symptoms are continuous and not consistent with cardiac pain. EKG normal. No further evaluation.

## 2012-06-20 NOTE — Assessment & Plan Note (Signed)
No aspirin given need for anticoagulation. Intolerant to statins. 

## 2012-06-20 NOTE — Progress Notes (Signed)
HPI: Pleasant female with past medical history of coronary artery disease and atrial fibrillation returns for followup. Last cardiac catheterization on Mar 06, 2007 showed an ejection fraction of 55%. There is nonobstructive plaque in the LAD, and circumflex and the right coronary artery was normal. 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 Echocardiogram in March of 2010 showed normal LV function, moderate biatrial enlargement and moderate tricuspid regurgitation. She has had a previous TEE guided cardioversion on Feb 27, 2009. Also note the patient is extremely sensitive to Coumadin. Previous TSH is normal. Carotid Dopplers in February of 2011 showed 0-39% stenosis bilaterally. Patient admitted in August of 2012 with recurrent atrial fibrillation and converted to sinus. Tikosyn initiated. [place] also initiated. Seen in September with hypertension. MRA of the abdomen in September of 2012 showed no left renal artery stenosis and previous right nephrectomy. 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 Hgb August 2013 was 12.7. BUN and creatinine 20 and 1.0 and potassium 4.3. Patient last seen in Dec 2012. Since then, she has some dyspnea on exertion but no orthopnea, PND, pedal edema or palpitations. No bleeding. She does have chest tightness that she states is continuous. It never completely resolves.  Current Outpatient Prescriptions  Medication Sig Dispense Refill  . amLODipine (NORVASC) 10 MG tablet Take 1 tablet (10 mg total) by mouth daily.  30 tablet  11  . ammonium lactate (LAC-HYDRIN) 12 % lotion as needed.      . cefdinir (OMNICEF) 300 MG capsule Take 300 mg by mouth 2 (two) times daily.      Marland Kitchen dofetilide (TIKOSYN) 125 MCG capsule Take 3 capsules (375 mcg total) by mouth 2 (two) times daily.  180 capsule  5  . escitalopram (LEXAPRO) 5 MG tablet Take 1 tablet (5 mg total) by mouth  daily.  30 tablet  11  . esomeprazole (NEXIUM) 40 MG capsule Take 1 capsule (40 mg total) by mouth 2 (two) times daily.  60 capsule  12  . fluconazole (DIFLUCAN) 150 MG tablet Take one tab today, may repeat in 3 days as need for vaginal itching  2 tablet  0  . Glucosamine-Chondroitin (GLUCOSAMINE CHONDR COMPLEX PO) Take by mouth. 1 daily        . loperamide (IMODIUM A-D) 2 MG tablet Take 2 mg by mouth as needed. Prn half tablet      . losartan (COZAAR) 100 MG tablet Take 1 tablet (100 mg total) by mouth daily.  30 tablet  11  . mesalamine (APRISO) 0.375 G 24 hr capsule Take 4 capsules (1.5 g total) by mouth daily.  120 capsule  5  . metoprolol tartrate (LOPRESSOR) 25 MG tablet TAKE 1/2 TABLET TWICE DAILY  30 tablet  12  . Multiple Vitamin (MULTIVITAMIN) capsule Take 1 capsule by mouth daily. 1 daily        . nitroGLYCERIN (NITROSTAT) 0.4 MG SL tablet Place 0.4 mg under the tongue every 5 (five) minutes as needed. 1 tab under tongue every 5 min for chest pain not to exceed 3 tabs in 15 min       . Probiotic Product (PHILLIPS COLON HEALTH) CAPS Take by mouth. 1 per day       . Rivaroxaban (XARELTO) 20 MG TABS Take 1 tablet by mouth daily.  30 tablet  4     Past Medical History  Diagnosis Date  .  CAD (coronary artery disease)   . Depression   . GERD (gastroesophageal reflux disease)   . Hyperlipidemia   . Hypertension   . Crohn's colitis     she reports ulcerative colitis beginning in her 59's, biopsies 2008 suggest Crohn's not UC  . Pseudoaneurysm of right femoral artery   . Pancreatitis   . Chronic sinusitis   . Renal oncocytoma     s/p right nephrectomy  . Myocardial infarction   . Uveitis   . Anxiety   . Obesity   . Chronic cough   . OSA (obstructive sleep apnea)   . Paroxysmal atrial fibrillation   . History of colonoscopy     Past Surgical History  Procedure Date  . Nephrectomy 10/2006    right secondary to oncocytoma   . Breast biopsy 1996    benign lesion   .  Cholecystectomy 10/2006  . Tubal ligation   . Right renal artery repair 1976  . Right femoral  pseudoaneurysm & right groin hematoma evacuation 02/2007    History   Social History  . Marital Status: Married    Spouse Name: N/A    Number of Children: 3  . Years of Education: N/A   Occupational History  .      retired   Social History Main Topics  . Smoking status: Never Smoker   . Smokeless tobacco: Never Used  . Alcohol Use: No  . Drug Use: No  . Sexually Active: Not Currently   Other Topics Concern  . 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 Remaining systems are negative.  Physical Exam: Well-developed obese in no acute distress.  Skin is warm and dry.  HEENT is normal.  Neck is supple.  Chest is clear to auscultation with normal expansion.  Cardiovascular exam is regular rate and rhythm.  Abdominal exam nontender or distended. No masses palpated. Extremities show no edema. neuro grossly intact  ECG 05/25/12 NSR with no ST changes; normal QT

## 2012-06-21 ENCOUNTER — Encounter (HOSPITAL_BASED_OUTPATIENT_CLINIC_OR_DEPARTMENT_OTHER): Payer: Self-pay | Admitting: *Deleted

## 2012-06-21 ENCOUNTER — Telehealth: Payer: Self-pay | Admitting: *Deleted

## 2012-06-21 ENCOUNTER — Emergency Department (HOSPITAL_BASED_OUTPATIENT_CLINIC_OR_DEPARTMENT_OTHER): Payer: Medicare Other

## 2012-06-21 ENCOUNTER — Emergency Department (HOSPITAL_BASED_OUTPATIENT_CLINIC_OR_DEPARTMENT_OTHER)
Admission: EM | Admit: 2012-06-21 | Discharge: 2012-06-21 | Disposition: A | Discharge status: Home / Self Care | Payer: Medicare Other | Attending: Emergency Medicine | Admitting: Emergency Medicine

## 2012-06-21 DIAGNOSIS — Z8489 Family history of other specified conditions: Secondary | ICD-10-CM | POA: Diagnosis not present

## 2012-06-21 DIAGNOSIS — G4733 Obstructive sleep apnea (adult) (pediatric): Secondary | ICD-10-CM | POA: Diagnosis not present

## 2012-06-21 DIAGNOSIS — Z885 Allergy status to narcotic agent status: Secondary | ICD-10-CM | POA: Insufficient documentation

## 2012-06-21 DIAGNOSIS — F411 Generalized anxiety disorder: Secondary | ICD-10-CM | POA: Diagnosis not present

## 2012-06-21 DIAGNOSIS — Z8249 Family history of ischemic heart disease and other diseases of the circulatory system: Secondary | ICD-10-CM | POA: Diagnosis not present

## 2012-06-21 DIAGNOSIS — K219 Gastro-esophageal reflux disease without esophagitis: Secondary | ICD-10-CM | POA: Insufficient documentation

## 2012-06-21 DIAGNOSIS — F329 Major depressive disorder, single episode, unspecified: Secondary | ICD-10-CM | POA: Diagnosis not present

## 2012-06-21 DIAGNOSIS — Z91040 Latex allergy status: Secondary | ICD-10-CM | POA: Insufficient documentation

## 2012-06-21 DIAGNOSIS — Z825 Family history of asthma and other chronic lower respiratory diseases: Secondary | ICD-10-CM | POA: Insufficient documentation

## 2012-06-21 DIAGNOSIS — Z823 Family history of stroke: Secondary | ICD-10-CM | POA: Diagnosis not present

## 2012-06-21 DIAGNOSIS — M79609 Pain in unspecified limb: Secondary | ICD-10-CM | POA: Diagnosis not present

## 2012-06-21 DIAGNOSIS — I2510 Atherosclerotic heart disease of native coronary artery without angina pectoris: Secondary | ICD-10-CM | POA: Diagnosis not present

## 2012-06-21 DIAGNOSIS — E669 Obesity, unspecified: Secondary | ICD-10-CM | POA: Diagnosis not present

## 2012-06-21 DIAGNOSIS — S335XXA Sprain of ligaments of lumbar spine, initial encounter: Secondary | ICD-10-CM | POA: Insufficient documentation

## 2012-06-21 DIAGNOSIS — F3289 Other specified depressive episodes: Secondary | ICD-10-CM | POA: Insufficient documentation

## 2012-06-21 DIAGNOSIS — Z888 Allergy status to other drugs, medicaments and biological substances status: Secondary | ICD-10-CM | POA: Diagnosis not present

## 2012-06-21 DIAGNOSIS — Z800 Family history of malignant neoplasm of digestive organs: Secondary | ICD-10-CM | POA: Insufficient documentation

## 2012-06-21 DIAGNOSIS — X500XXA Overexertion from strenuous movement or load, initial encounter: Secondary | ICD-10-CM | POA: Insufficient documentation

## 2012-06-21 DIAGNOSIS — Z043 Encounter for examination and observation following other accident: Secondary | ICD-10-CM | POA: Diagnosis not present

## 2012-06-21 DIAGNOSIS — I10 Essential (primary) hypertension: Secondary | ICD-10-CM | POA: Diagnosis not present

## 2012-06-21 DIAGNOSIS — S39012A Strain of muscle, fascia and tendon of lower back, initial encounter: Secondary | ICD-10-CM

## 2012-06-21 MED ORDER — OXYCODONE-ACETAMINOPHEN 5-325 MG PO TABS: 2.0000 | ORAL_TABLET | ORAL | Status: AC | PRN

## 2012-06-21 MED ORDER — OXYCODONE-ACETAMINOPHEN 5-325 MG PO TABS
2.0000 | ORAL_TABLET | Freq: Once | ORAL | Status: AC
  Administered 2012-06-21: 2 via ORAL
  Filled 2012-06-21 (×2): qty 2

## 2012-06-21 NOTE — Telephone Encounter (Signed)
Would avoid nsaids due to xarelto use. Tylenol safe. Offer appt

## 2012-06-21 NOTE — ED Provider Notes (Signed)
History     CSN: [id number]  Arrival date & time 06/21/12  1626   First MD Initiated Contact with Patient 06/21/12 1656      Chief Complaint  Patient presents with  . Back Pain    (Consider location/radiation/quality/duration/timing/severity/associated sxs/prior treatment) Patient is a 66 y.o. female presenting with back pain. The history is provided by the patient. No language interpreter was used.  Back Pain  This is a new problem. The current episode started 6 to 12 hours ago. The problem occurs constantly. The problem has been gradually worsening. The pain is associated with twisting. The pain is present in the lumbar spine. The quality of the pain is described as shooting. The pain radiates to the right thigh. The pain is severe. The symptoms are aggravated by bending. Associated symptoms include abdominal pain and tingling. Pertinent negatives include no chest pain and no weakness. She has tried nothing for the symptoms. The treatment provided no relief. Risk factors: age.  Pt complains that she had pain when bending   Past Medical History  Diagnosis Date  . CAD (coronary artery disease)   . Depression   . GERD (gastroesophageal reflux disease)   . Hyperlipidemia   . Hypertension   . Crohn's colitis     she reports ulcerative colitis beginning in her 63's, biopsies 2008 suggest Crohn's not UC  . Pseudoaneurysm of right femoral artery   . Pancreatitis   . Chronic sinusitis   . Renal oncocytoma     s/p right nephrectomy  . Myocardial infarction   . Uveitis   . Anxiety   . Obesity   . Chronic cough   . OSA (obstructive sleep apnea)   . Paroxysmal atrial fibrillation   . History of colonoscopy     Past Surgical History  Procedure Date  . Nephrectomy 10/2006    right secondary to oncocytoma   . Breast biopsy 1996    benign lesion   . Cholecystectomy 10/2006  . Tubal ligation   . Right renal artery repair 1976  . Right femoral  pseudoaneurysm & right groin hematoma  evacuation 02/2007    Family History  Problem Relation Age of Onset  . Colitis Father   . Hypertension Mother   . Coronary artery disease Mother   . Stroke Mother   . Stroke Brother   . Colon cancer Paternal Aunt   . Clotting disorder Mother   . Clotting disorder Brother   . Clotting disorder Sister   . Heart disease Mother   . Heart disease Father   . Asthma Daughter   . Allergies Mother   . Asthma Maternal Uncle     History  Substance Use Topics  . Smoking status: Never Smoker   . Smokeless tobacco: Never Used  . Alcohol Use: No    OB History    Grav Para Term Preterm Abortions TAB SAB Ect Mult Living                  Review of Systems  Cardiovascular: Negative for chest pain.  Gastrointestinal: Positive for abdominal pain.  Musculoskeletal: Positive for back pain.  Neurological: Positive for tingling. Negative for weakness.  All other systems reviewed and are negative.    Allergies  Cefdinir; Codeine; Guaifenesin er; Latex; and Mucilgen  Home Medications   Current Outpatient Rx  Name Route Sig Dispense Refill  . AMLODIPINE BESYLATE 10 MG PO TABS Oral Take 1 tablet (10 mg total) by mouth daily. 30 tablet 11  .  AMMONIUM LACTATE 12 % EX LOTN Topical Apply 1 application topically daily as needed. For dry skin    . DOFETILIDE 125 MCG PO CAPS Oral Take 3 capsules (375 mcg total) by mouth 2 (two) times daily. 180 capsule 5  . ESCITALOPRAM OXALATE 5 MG PO TABS Oral Take 1 tablet (5 mg total) by mouth daily. 30 tablet 11  . ESOMEPRAZOLE MAGNESIUM 40 MG PO CPDR Oral Take 1 capsule (40 mg total) by mouth 2 (two) times daily. 60 capsule 12  . FLUCONAZOLE 150 MG PO TABS  Take one tab today, may repeat in 3 days as need for vaginal itching 2 tablet 0  . GLUCOSAMINE CHONDR COMPLEX PO Oral Take 1 tablet by mouth at bedtime. Hold while in hospital    . HYDRALAZINE HCL 25 MG PO TABS Oral Take 1 tablet (25 mg total) by mouth 3 (three) times daily. 90 tablet 11  . LOPERAMIDE  HCL 2 MG PO TABS Oral Take 1 mg by mouth daily as needed. Dihrrhea    . LOSARTAN POTASSIUM 100 MG PO TABS Oral Take 1 tablet (100 mg total) by mouth daily. 30 tablet 11  . MESALAMINE ER 0.375 G PO CP24 Oral Take 4 capsules (1.5 g total) by mouth daily. 120 capsule 5  . METOPROLOL TARTRATE 25 MG PO TABS Oral Take 12.5 mg by mouth 2 (two) times daily. TAKE 1/2 TABLET TWICE DAILY    . MULTIVITAMINS PO CAPS Oral Take 1 capsule by mouth daily. 1 daily      . NITROGLYCERIN 0.4 MG SL SUBL Sublingual Place 0.4 mg under the tongue every 5 (five) minutes as needed. 1 tab under tongue every 5 min for chest pain not to exceed 3 tabs in 15 min     . PHILLIPS COLON HEALTH PO CAPS Oral Take 1 capsule by mouth daily. 1 per day    . RIVAROXABAN 20 MG PO TABS Oral Take 1 tablet by mouth daily. 30 tablet 4    BP 172/65  Pulse 70  Temp 98.2 F (36.8 C) (Oral)  Resp 22  SpO2 98%  Physical Exam  Nursing note and vitals reviewed. Constitutional: She is oriented to person, place, and time. She appears well-nourished.  HENT:  Head: Normocephalic.  Eyes: Pupils are equal, round, and reactive to light.  Cardiovascular: Normal rate.   Pulmonary/Chest: Effort normal.  Abdominal: Soft.  Musculoskeletal: She exhibits tenderness.       Tender ls spine right paravertebrals,  Pain with movement of right leg  Neurological: She is alert and oriented to person, place, and time. She has normal reflexes.  Skin: Skin is warm.  Psychiatric: She has a normal mood and affect.    ED Course  Procedures (including critical care time)  Labs Reviewed - No data to display Dg Lumbar Spine Complete  06/21/2012  *RADIOLOGY REPORT*  Clinical Data: Acute onset right leg pain after bending injury.  LUMBAR SPINE - COMPLETE 4+ VIEW  Comparison: CT abdomen and pelvis 03/10/2007.  Findings: Vertebral body height and alignment are maintained. There is some loss of disc space height at L4-5 and L5-S1.  Lower lumbar facet degenerative  change noted.  IMPRESSION: No acute finding.  Lower lumbar degenerative change.   Original Report Authenticated By: Bernadene Bell. D'ALESSIO, M.D.      1. Lumbar strain       MDM  Pt reports some pain relief with percocet.   I advised see Dr. Rodena Medin for recheck on Monday  Lonia Skinner [place], [place] 06/21/12 1849  Lonia Skinner [place], [place] 06/21/12 [phone]

## 2012-06-21 NOTE — Telephone Encounter (Signed)
Attempted to reach pt and left message on home # to return my call. 

## 2012-06-21 NOTE — ED Provider Notes (Signed)
Medical screening examination/treatment/procedure(s) were performed by non-physician practitioner and as supervising physician I was immediately available for consultation/collaboration.    Sydni Elizarraraz, MD 06/21/12 2219 

## 2012-06-21 NOTE — Telephone Encounter (Signed)
Received message from pt stating she bent over to retrieve item from her dishwasher this morning and experienced a sharp pain in her lower back; now has pain radiating down her right leg.  Pt wants to know what she can take / do to relieve her pain?  Please advise.

## 2012-06-21 NOTE — ED Notes (Signed)
Bent over this am to put something in the dish washer and has had lower back pain with radiation into her right leg.

## 2012-06-22 MED ORDER — METHYLPREDNISOLONE 4 MG PO KIT: PACK | ORAL | Status: AC

## 2012-06-22 NOTE — Telephone Encounter (Signed)
Spoke with pt and she reports that she had swelling under her jaw and itching after taking Percocet. Wants to know what else she can try besides Tylenol as that has not been helping. Also reports that she has appt scheduled with Dr Rodena Medin on 07/03/12 as " this was the first available appt she could get".  Please advise.

## 2012-06-22 NOTE — Telephone Encounter (Signed)
Attempted to reach pt , line busy

## 2012-06-22 NOTE — Telephone Encounter (Signed)
I would recommend that she stop percocet.  Try medrol Dose pak + PRN tylenol, follow up with Dr. Rodena Medin next week.

## 2012-06-22 NOTE — Telephone Encounter (Signed)
Notified pt of instructions below and scheduled pt f/u for 06/28/12 at 1:45pm. Pt voices understanding.

## 2012-06-22 NOTE — Telephone Encounter (Signed)
Patient went to ED yesterday for "severe back pain radiating down left leg" where she was given Percocet and had "a reaction" and request alternative Rx; [name] has spoken with patient/SLS

## 2012-06-28 ENCOUNTER — Ambulatory Visit (INDEPENDENT_AMBULATORY_CARE_PROVIDER_SITE_OTHER): Payer: Medicare Other | Admitting: Internal Medicine

## 2012-06-28 ENCOUNTER — Encounter: Payer: Self-pay | Admitting: Internal Medicine

## 2012-06-28 VITALS — BP 142/86 | HR 63 | Temp 97.8°F | Resp 16 | Wt 233.5 lb

## 2012-06-28 DIAGNOSIS — IMO0002 Reserved for concepts with insufficient information to code with codable children: Secondary | ICD-10-CM | POA: Diagnosis not present

## 2012-06-28 DIAGNOSIS — M549 Dorsalgia, unspecified: Secondary | ICD-10-CM | POA: Diagnosis not present

## 2012-06-28 DIAGNOSIS — M5410 Radiculopathy, site unspecified: Secondary | ICD-10-CM | POA: Insufficient documentation

## 2012-06-28 MED ORDER — CYCLOBENZAPRINE HCL 10 MG PO TABS: 10.0000 mg | ORAL_TABLET | Freq: Three times a day (TID) | ORAL | Status: AC | PRN

## 2012-06-28 MED ORDER — METHYLPREDNISOLONE ACETATE 80 MG/ML IJ SUSP
40.0000 mg | Freq: Once | INTRAMUSCULAR | Status: AC
  Administered 2012-06-28: 40 mg via INTRAMUSCULAR

## 2012-06-28 NOTE — Assessment & Plan Note (Signed)
Defers further po steroids. Given depomedrol IM injxn. Attempt flexeril prn-cautioned re possible sedating effect. Schedule close f/u.

## 2012-06-28 NOTE — Progress Notes (Signed)
  Subjective:    Patient ID: [name], female    DOB: 11-Dec-1945, 66 y.o.   MRN: [id number]  HPI Pt presents to clinic for evaluation of leg pain. Notes sudden onset of right lbp with radiation down right leg after reaching to the dishwasher ~8/24. Avoided nsaids due to xarelto use. Attempted tylenol. Seen in ED with worsening pain and given percocet but developed itching and medication was stopped. Then attempted medrol dosepak just completed. Pain better but states prednisone made her "mean." Radicular pain located anterior and denies paresthesia or weakness. Plain radiograph of ls demonstrated degenerative changes. No other alleviating or exacerbating factors.   Past Medical History  Diagnosis Date  . CAD (coronary artery disease)   . Depression   . GERD (gastroesophageal reflux disease)   . Hyperlipidemia   . Hypertension   . Crohn's colitis     she reports ulcerative colitis beginning in her 62's, biopsies 2008 suggest Crohn's not UC  . Pseudoaneurysm of right femoral artery   . Pancreatitis   . Chronic sinusitis   . Renal oncocytoma     s/p right nephrectomy  . Myocardial infarction   . Uveitis   . Anxiety   . Obesity   . Chronic cough   . OSA (obstructive sleep apnea)   . Paroxysmal atrial fibrillation   . History of colonoscopy    Past Surgical History  Procedure Date  . Nephrectomy 10/2006    right secondary to oncocytoma   . Breast biopsy 1996    benign lesion   . Cholecystectomy 10/2006  . Tubal ligation   . Right renal artery repair 1976  . Right femoral  pseudoaneurysm & right groin hematoma evacuation 02/2007    reports that she has never smoked. She has never used smokeless tobacco. She reports that she does not drink alcohol or use illicit drugs. family history includes Allergies in her mother; Asthma in her daughter and maternal uncle; Clotting disorder in her brother, mother, and sister; Colitis in her father; Colon cancer in her paternal aunt; Coronary  artery disease in her mother; Heart disease in her father and mother; Hypertension in her mother; and Stroke in her brother and mother. Allergies  Allergen Reactions  . Cefdinir     Pt cannot take; may interfere with AFib.  . Codeine     Heart beats fast   . Guaifenesin Er Nausea And Vomiting and Other (See Comments)    Headaches; can tolerate liquid.  . Latex     Breathing problems  . Mucilgen (Psyllium)   . Percocet (Oxycodone-Acetaminophen)     Itching & swelling in jaw    Review of Systems see hpi     Objective:   Physical Exam  Nursing note and vitals reviewed. Constitutional: She appears well-developed and well-nourished. No distress.  HENT:  Head: Normocephalic and atraumatic.  Eyes: Conjunctivae are normal.  Musculoskeletal:       No midline ls tenderness or bony abn. +right paraspinal muscle tenderness and spasm. Bilateral le strength 5/5. Gait nl.  Neurological: She is alert.  Skin: Skin is warm and dry. She is not diaphoretic.  Psychiatric: She has a normal mood and affect.          Assessment & Plan:

## 2012-06-30 ENCOUNTER — Other Ambulatory Visit: Payer: Self-pay | Admitting: Physician Assistant

## 2012-07-03 ENCOUNTER — Ambulatory Visit: Payer: Medicare Other | Admitting: Internal Medicine

## 2012-07-06 ENCOUNTER — Encounter: Payer: Self-pay | Admitting: Internal Medicine

## 2012-07-06 ENCOUNTER — Ambulatory Visit (INDEPENDENT_AMBULATORY_CARE_PROVIDER_SITE_OTHER): Payer: Medicare Other | Admitting: Internal Medicine

## 2012-07-06 VITALS — BP 132/78 | HR 75 | Temp 98.3°F | Resp 16 | Wt 229.5 lb

## 2012-07-06 DIAGNOSIS — M549 Dorsalgia, unspecified: Secondary | ICD-10-CM | POA: Diagnosis not present

## 2012-07-06 DIAGNOSIS — IMO0002 Reserved for concepts with insufficient information to code with codable children: Secondary | ICD-10-CM | POA: Diagnosis not present

## 2012-07-06 DIAGNOSIS — M5410 Radiculopathy, site unspecified: Secondary | ICD-10-CM

## 2012-07-06 NOTE — Assessment & Plan Note (Signed)
Improving. Proceed with physical therapy. Patient to reconsider oral steroids if no further improvement

## 2012-07-06 NOTE — Progress Notes (Signed)
  Subjective:    Patient ID: [name], female    DOB: 08/11/1946, 66 y.o.   MRN: [id number]  HPI patient presents to clinic for followup of radicular back pain. Attempted Depo-Medrol injection and Flexeril last visit. Symptoms 50% better. No current back pain. Does have intermittent right leg radicular pain without weakness. Avoiding anti-inflammatories due to anticoagulation. Plain films were reviewed demonstrating degenerative changes. Patient attempting to avoid oral steroids. No other alleviating or exacerbating factors.  Past Medical History  Diagnosis Date  . CAD (coronary artery disease)   . Depression   . GERD (gastroesophageal reflux disease)   . Hyperlipidemia   . Hypertension   . Crohn's colitis     she reports ulcerative colitis beginning in her 20's, biopsies 2008 suggest Crohn's not UC  . Pseudoaneurysm of right femoral artery   . Pancreatitis   . Chronic sinusitis   . Renal oncocytoma     s/p right nephrectomy  . Myocardial infarction   . Uveitis   . Anxiety   . Obesity   . Chronic cough   . OSA (obstructive sleep apnea)   . Paroxysmal atrial fibrillation   . History of colonoscopy    Past Surgical History  Procedure Date  . Nephrectomy 10/2006    right secondary to oncocytoma   . Breast biopsy 1996    benign lesion   . Cholecystectomy 10/2006  . Tubal ligation   . Right renal artery repair 1976  . Right femoral  pseudoaneurysm & right groin hematoma evacuation 02/2007    reports that she has never smoked. She has never used smokeless tobacco. She reports that she does not drink alcohol or use illicit drugs. family history includes Allergies in her mother; Asthma in her daughter and maternal uncle; Clotting disorder in her brother, mother, and sister; Colitis in her father; Colon cancer in her paternal aunt; Coronary artery disease in her mother; Heart disease in her father and mother; Hypertension in her mother; and Stroke in her brother and  mother. Allergies  Allergen Reactions  . Cefdinir     Pt cannot take; may interfere with AFib.  . Codeine     Heart beats fast   . Guaifenesin Er Nausea And Vomiting and Other (See Comments)    Headaches; can tolerate liquid.  . Latex     Breathing problems  . Mucilgen (Psyllium)   . Percocet (Oxycodone-Acetaminophen)     Itching & swelling in jaw     Review of Systems see history of present illness     Objective:   Physical Exam  Nursing note and vitals reviewed. Constitutional: She appears well-developed and well-nourished. No distress.  Skin: She is not diaphoretic.          Assessment & Plan:

## 2012-07-11 ENCOUNTER — Ambulatory Visit: Payer: Medicare Other | Attending: Internal Medicine | Admitting: Rehabilitation

## 2012-07-11 DIAGNOSIS — M545 Low back pain, unspecified: Secondary | ICD-10-CM | POA: Diagnosis not present

## 2012-07-11 DIAGNOSIS — IMO0001 Reserved for inherently not codable concepts without codable children: Secondary | ICD-10-CM | POA: Insufficient documentation

## 2012-07-11 DIAGNOSIS — M25569 Pain in unspecified knee: Secondary | ICD-10-CM | POA: Insufficient documentation

## 2012-07-12 ENCOUNTER — Encounter: Payer: Self-pay | Admitting: Internal Medicine

## 2012-07-13 ENCOUNTER — Ambulatory Visit: Payer: Medicare Other | Admitting: Rehabilitation

## 2012-07-16 ENCOUNTER — Ambulatory Visit: Payer: Medicare Other | Admitting: Rehabilitation

## 2012-07-18 ENCOUNTER — Ambulatory Visit: Payer: Medicare Other | Admitting: Rehabilitation

## 2012-07-18 ENCOUNTER — Encounter: Payer: Self-pay | Admitting: Internal Medicine

## 2012-07-23 ENCOUNTER — Ambulatory Visit (INDEPENDENT_AMBULATORY_CARE_PROVIDER_SITE_OTHER): Payer: Medicare Other | Admitting: *Deleted

## 2012-07-23 ENCOUNTER — Ambulatory Visit: Payer: Medicare Other | Admitting: Rehabilitation

## 2012-07-23 DIAGNOSIS — IMO0001 Reserved for inherently not codable concepts without codable children: Secondary | ICD-10-CM | POA: Diagnosis not present

## 2012-07-23 DIAGNOSIS — Z23 Encounter for immunization: Secondary | ICD-10-CM

## 2012-07-23 DIAGNOSIS — M25569 Pain in unspecified knee: Secondary | ICD-10-CM | POA: Diagnosis not present

## 2012-07-23 DIAGNOSIS — M545 Low back pain, unspecified: Secondary | ICD-10-CM | POA: Diagnosis not present

## 2012-07-25 ENCOUNTER — Ambulatory Visit: Payer: Medicare Other | Attending: Internal Medicine | Admitting: Rehabilitation

## 2012-07-25 DIAGNOSIS — M545 Low back pain, unspecified: Secondary | ICD-10-CM | POA: Insufficient documentation

## 2012-07-25 DIAGNOSIS — IMO0001 Reserved for inherently not codable concepts without codable children: Secondary | ICD-10-CM | POA: Insufficient documentation

## 2012-07-25 DIAGNOSIS — M25569 Pain in unspecified knee: Secondary | ICD-10-CM | POA: Diagnosis not present

## 2012-07-28 ENCOUNTER — Other Ambulatory Visit: Payer: Self-pay | Admitting: Cardiology

## 2012-07-30 ENCOUNTER — Other Ambulatory Visit: Payer: Self-pay | Admitting: *Deleted

## 2012-07-30 ENCOUNTER — Ambulatory Visit: Payer: Medicare Other | Admitting: Rehabilitation

## 2012-07-30 DIAGNOSIS — M25569 Pain in unspecified knee: Secondary | ICD-10-CM | POA: Diagnosis not present

## 2012-07-30 DIAGNOSIS — M545 Low back pain: Secondary | ICD-10-CM | POA: Diagnosis not present

## 2012-07-30 DIAGNOSIS — IMO0001 Reserved for inherently not codable concepts without codable children: Secondary | ICD-10-CM | POA: Diagnosis not present

## 2012-07-30 MED ORDER — RIVAROXABAN 20 MG PO TABS: 20.0000 mg | ORAL_TABLET | Freq: Every day | ORAL | Status: DC

## 2012-07-30 NOTE — Telephone Encounter (Signed)
Refilled Xarelto. 

## 2012-07-31 ENCOUNTER — Other Ambulatory Visit: Payer: Self-pay

## 2012-07-31 MED ORDER — DOFETILIDE 125 MCG PO CAPS: 375.0000 ug | ORAL_CAPSULE | Freq: Two times a day (BID) | ORAL | Status: DC

## 2012-08-01 ENCOUNTER — Ambulatory Visit: Payer: Medicare Other | Admitting: Rehabilitation

## 2012-08-01 DIAGNOSIS — M545 Low back pain: Secondary | ICD-10-CM | POA: Diagnosis not present

## 2012-08-01 DIAGNOSIS — IMO0001 Reserved for inherently not codable concepts without codable children: Secondary | ICD-10-CM | POA: Diagnosis not present

## 2012-08-01 DIAGNOSIS — M25569 Pain in unspecified knee: Secondary | ICD-10-CM | POA: Diagnosis not present

## 2012-08-07 ENCOUNTER — Other Ambulatory Visit: Payer: Self-pay | Admitting: Internal Medicine

## 2012-08-09 ENCOUNTER — Other Ambulatory Visit: Payer: Self-pay | Admitting: Internal Medicine

## 2012-08-09 DIAGNOSIS — Z1231 Encounter for screening mammogram for malignant neoplasm of breast: Secondary | ICD-10-CM

## 2012-08-15 ENCOUNTER — Ambulatory Visit (HOSPITAL_BASED_OUTPATIENT_CLINIC_OR_DEPARTMENT_OTHER)
Admission: RE | Admit: 2012-08-15 | Discharge: 2012-08-15 | Disposition: A | Discharge status: Home / Self Care | Payer: Medicare Other | Source: Ambulatory Visit | Attending: Internal Medicine | Admitting: Internal Medicine

## 2012-08-15 DIAGNOSIS — Z1231 Encounter for screening mammogram for malignant neoplasm of breast: Secondary | ICD-10-CM | POA: Diagnosis not present

## 2012-08-17 ENCOUNTER — Ambulatory Visit (INDEPENDENT_AMBULATORY_CARE_PROVIDER_SITE_OTHER): Payer: Medicare Other | Admitting: Family

## 2012-08-17 ENCOUNTER — Other Ambulatory Visit (HOSPITAL_COMMUNITY)
Admission: RE | Admit: 2012-08-17 | Discharge: 2012-08-17 | Disposition: A | Discharge status: Home / Self Care | Payer: Medicare Other | Source: Ambulatory Visit | Attending: Family | Admitting: Family

## 2012-08-17 ENCOUNTER — Encounter: Payer: Self-pay | Admitting: Family

## 2012-08-17 VITALS — BP 126/82 | HR 62 | Temp 98.6°F | Resp 16 | Ht 63.25 in | Wt 238.0 lb

## 2012-08-17 DIAGNOSIS — Z0000 Encounter for general adult medical examination without abnormal findings: Secondary | ICD-10-CM | POA: Insufficient documentation

## 2012-08-17 DIAGNOSIS — Z124 Encounter for screening for malignant neoplasm of cervix: Secondary | ICD-10-CM | POA: Diagnosis not present

## 2012-08-17 DIAGNOSIS — Z01419 Encounter for gynecological examination (general) (routine) without abnormal findings: Secondary | ICD-10-CM | POA: Diagnosis not present

## 2012-08-17 NOTE — Progress Notes (Signed)
Subjective:    Patient ID: [name], female    DOB: [birth year], 66 y.o.   MRN: [id number]  HPI  [name] is a 66 yr old female who presents today for pap smear.  [name] denies hx of abnormal paps and reports that [name] has kept up with regular pap smears through the years.  [name] reports that her periods stopped between age 57 and 57.  Last mammogram was Wednesday. Denies vaginal discharge or itching.  Does report urinary leakage.   Review of Systems See HPI  Past Medical History  Diagnosis Date  . CAD (coronary artery disease)   . Depression   . GERD (gastroesophageal reflux disease)   . Hyperlipidemia   . Hypertension   . Crohn's colitis     [name] reports ulcerative colitis beginning in her 19's, biopsies 2008 suggest Crohn's not UC  . Pseudoaneurysm of right femoral artery   . Pancreatitis   . Chronic sinusitis   . Renal oncocytoma     s/p right nephrectomy  . Myocardial infarction   . Uveitis   . Anxiety   . Obesity   . Chronic cough   . OSA (obstructive sleep apnea)   . Paroxysmal atrial fibrillation   . History of colonoscopy     History   Social History  . Marital Status: Married    Spouse Name: N/A    Number of Children: 3  . Years of Education: N/A   Occupational History  .      retired   Social History Main Topics  . Smoking status: Never Smoker   . Smokeless tobacco: Never Used  . Alcohol Use: No  . Drug Use: No  . Sexually Active: Not Currently   Other Topics Concern  . 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 Nephrectomy 10/2006    right secondary to oncocytoma   . Breast biopsy 1996    benign lesion   . Cholecystectomy 10/2006  . Tubal ligation   . Right renal artery repair 1976  . Right femoral  pseudoaneurysm & right groin hematoma evacuation 02/2007    Family  History  Problem Relation Age of Onset  . Colitis Father   . Hypertension Mother   . Coronary artery disease Mother   . Stroke Mother   . Stroke Brother   . Colon cancer Paternal Aunt   . Clotting disorder Mother   . Clotting disorder Brother   . Clotting disorder Sister   . Heart disease Mother   . Heart disease Father   . Asthma Daughter   . Allergies Mother   . Asthma Maternal Uncle     Allergies  Allergen Reactions  . Cefdinir     Pt cannot take; may interfere with AFib.  . Codeine     Heart beats fast   . Guaifenesin Er Nausea And Vomiting and Other (See Comments)    Headaches; can tolerate liquid.  . Latex     Breathing problems  . Mucilgen (Psyllium)   . Percocet (Oxycodone-Acetaminophen)     Itching & swelling in jaw    Current Outpatient Prescriptions on File Prior to Visit  Medication Sig Dispense Refill  . amLODipine (NORVASC) 10 MG tablet TAKE 1 TABLET (10 MG TOTAL) BY MOUTH DAILY.  30 tablet  11  . ammonium lactate (  LAC-HYDRIN) 12 % lotion Apply 1 application topically daily as needed. For dry skin      . APRISO 0.375 G 24 hr capsule TAKE 4 CAPSULES (1.5 G TOTAL) BY MOUTH DAILY.  120 capsule  0  . dofetilide (TIKOSYN) 125 MCG capsule Take 3 capsules (375 mcg total) by mouth 2 (two) times daily.  180 capsule  5  . escitalopram (LEXAPRO) 5 MG tablet Take 1 tablet (5 mg total) by mouth daily.  30 tablet  11  . esomeprazole (NEXIUM) 40 MG capsule Take 1 capsule (40 mg total) by mouth 2 (two) times daily.  60 capsule  12  . Glucosamine-Chondroitin (GLUCOSAMINE CHONDR COMPLEX PO) Take 1 tablet by mouth at bedtime. Hold while in hospital      . hydrALAZINE (APRESOLINE) 25 MG tablet Take 1 tablet (25 mg total) by mouth 3 (three) times daily.  90 tablet  11  . loperamide (IMODIUM A-D) 2 MG tablet Take 1 mg by mouth daily as needed. Dihrrhea      . losartan (COZAAR) 100 MG tablet TAKE 1 TABLET (100 MG TOTAL) BY MOUTH DAILY.  30 tablet  11  . metoprolol tartrate  (LOPRESSOR) 25 MG tablet Take 12.5 mg by mouth 2 (two) times daily. TAKE 1/2 TABLET TWICE DAILY      . Multiple Vitamin (MULTIVITAMIN) capsule Take 1 capsule by mouth daily. 1 daily        . nitroGLYCERIN (NITROSTAT) 0.4 MG SL tablet Place 0.4 mg under the tongue every 5 (five) minutes as needed. 1 tab under tongue every 5 min for chest pain not to exceed 3 tabs in 15 min       . Probiotic Product (PHILLIPS COLON HEALTH) CAPS Take 1 capsule by mouth daily. 1 per day      . Rivaroxaban (XARELTO) 20 MG TABS Take 1 tablet (20 mg total) by mouth daily.  30 tablet  4    BP 126/82  Pulse 62  Temp 98.6 F (37 C) (Oral)  Resp 16  Ht 5' 3.25" (1.607 m)  Wt 238 lb (107.956 kg)  BMI 41.83 kg/m2  SpO2 95%       Objective:   Physical Exam  Constitutional: [name] appears well-developed and well-nourished. No distress.  Genitourinary:       BUS/Urethra/Skene's glands: Normal  Bladder: Normal  Vagina: atrophic Cervix: Normal  Uterus: normal in size, shape and contour. Midline and mobile  Adnexa/parametria:  Rt: Without masses or tenderness.  Lt: Without masses or tenderness.  Anus and perineum: Normal            Assessment & Plan:

## 2012-08-17 NOTE — Patient Instructions (Addendum)
We will mail you the results of your pap smear.  

## 2012-08-17 NOTE — Assessment & Plan Note (Signed)
Pap performed today with chaperone present.  Mammogram up to date, dexa up to date.

## 2012-08-21 ENCOUNTER — Telehealth: Payer: Self-pay

## 2012-08-21 ENCOUNTER — Encounter: Payer: Self-pay | Admitting: Internal Medicine

## 2012-08-21 ENCOUNTER — Ambulatory Visit (INDEPENDENT_AMBULATORY_CARE_PROVIDER_SITE_OTHER): Payer: Medicare Other | Admitting: Internal Medicine

## 2012-08-21 VITALS — BP 126/68 | HR 80 | Ht 62.5 in | Wt 238.2 lb

## 2012-08-21 DIAGNOSIS — K5010 Crohn's disease of large intestine without complications: Secondary | ICD-10-CM

## 2012-08-21 DIAGNOSIS — K589 Irritable bowel syndrome without diarrhea: Secondary | ICD-10-CM | POA: Diagnosis not present

## 2012-08-21 MED ORDER — MESALAMINE ER 0.375 G PO CP24: 1500.0000 mg | ORAL_CAPSULE | Freq: Every day | ORAL | Status: DC

## 2012-08-21 MED ORDER — SUPREP BOWEL PREP KIT 17.5-3.13-1.6 GM/177ML PO SOLN: ORAL | Status: DC

## 2012-08-21 NOTE — Telephone Encounter (Signed)
DC xeralto 2 days prior to procedure and resume 2 days later [facility]

## 2012-08-21 NOTE — Telephone Encounter (Signed)
[facility] [address] [place] [address] 08/21/2012    RE: [name] DOB: July 08, 1946 MRN: [id number]   Dear Dr. Olga Millers,    We have scheduled the above patient for an endoscopic procedure (colonoscopy). Our records show that she is on anticoagulation therapy.   Please advise as to how long the patient may come off her therapy of Xarelto prior to the procedure, which is scheduled for 09/10/12 at 2pm.  Please fax back/ or route the completed form to Elvis Coil at [phone].   Sincerely,  [place], Clayborne Dana

## 2012-08-21 NOTE — Patient Instructions (Addendum)
You have been scheduled for a colonoscopy with propofol. Please follow written instructions given to you at your visit today.  Please pick up your prep kit at the pharmacy within the next 1-3 days. If you use inhalers (even only as needed) or a CPAP machine, please bring them with you on the day of your procedure.  You will be contaced by our office prior to your procedure for directions on holding your Xarelto.  If you do not hear from our office 1 week prior to your scheduled procedure, please call [phone] to discuss.  We have sent the following medications to your pharmacy for you to pick up at your convenience: Apriso  Thank you for choosing me and [facility].  Iva Boop, M.D., [facility]

## 2012-08-21 NOTE — Progress Notes (Signed)
  Subjective:    Patient ID: [name], female    DOB: 01/11/1946, 66 y.o.   MRN: [id number]  HPI This elderly white woman is followed here for Crohn's colitis. The last time she was seen she might have had diverticulitis and was treated with antibiotics. She previously had a colonoscopy about 2 years ago. Crohn's colitis is in remission. She is now on Apriso, dosed at 1.5 g daily but generally only taking two out of four prescribed tablets a day because of the cost of medications. She continues with her chronic alternating bowel habits and IBS pattern which do not disturb her quality of life significantly she tells me. Otherwise overall things are stable. GERD is controlled with PPI. She using CPAP at night for sleep apnea without problems. No recent cardiac problems. She has had some low back pain issues and is in physical therapy. She was changed from warfarin to Xarelto for anticoagulation to prevent stroke in the setting of A. fib.  Medications, allergies, past medical history, past surgical history, family history and social history are reviewed and updated in the EMR.  Review of Systems As above    Objective:   Physical Exam General:  NAD Eyes:   anicteric Lungs:  clear Heart:  S1S2 no rubs, murmurs or gallops, regular rhythm with occasional premature beat Abdomen:  soft and nontender, BS+, right upper quadrant scar from cholecystectomy with some keloid formation Ext:   no edema    Data Reviewed:  2011 colonoscopy and pathology reports Emergency room reports Chest x-ray August 2013 CBC and chemistries from August 2013 which are normal except for slightly decreased GFR     Assessment & Plan:   1. Crohn's colitis   2. IBS (irritable bowel syndrome)    Routine colonoscopy is appropriate in this lady with chronic Crohn's colitis. Need to assess disease status. She takes Xarelto for atrial fibrillation and this should be held 24 hours prior to the procedure unless her  cardiologist thinks that it is unwise. We will check with Dr. Jens Som on  this.The risks and benefits as well as alternatives of endoscopic procedure(s) have been discussed and reviewed. All questions answered. The patient agrees to proceed. She understands the rare but possible risk of having a stroke off of anticoagulation.  I have refilled her Apriso for the year.  Note she has a remote history of adenomatous colon polyps, diminutive in 2008.  If this colonoscopy is normal, could consider trying to come off therapy as I think she has more IBS and Crohn's disease at this time though it can be difficult to tell and she could have recurrence. If her Crohn's is in remission  I would likely advance his to 3 years followup.

## 2012-08-22 ENCOUNTER — Encounter: Payer: Self-pay | Admitting: Family

## 2012-08-22 NOTE — Telephone Encounter (Signed)
Patient given instructions on holding her xarelto 2 days prior to procedure per Dr. Jens Som.

## 2012-08-22 NOTE — Telephone Encounter (Signed)
Note forwarded to GI

## 2012-09-03 ENCOUNTER — Ambulatory Visit: Payer: Medicare Other | Attending: Internal Medicine | Admitting: Rehabilitation

## 2012-09-03 ENCOUNTER — Telehealth: Payer: Self-pay | Admitting: Cardiology

## 2012-09-03 DIAGNOSIS — M25569 Pain in unspecified knee: Secondary | ICD-10-CM | POA: Diagnosis not present

## 2012-09-03 DIAGNOSIS — IMO0001 Reserved for inherently not codable concepts without codable children: Secondary | ICD-10-CM | POA: Diagnosis not present

## 2012-09-03 DIAGNOSIS — M545 Low back pain, unspecified: Secondary | ICD-10-CM | POA: Insufficient documentation

## 2012-09-03 NOTE — Telephone Encounter (Signed)
New Problem:    Patient called in wanting to speak with you about the Propafol that they are going to use for her upcoming colonoscopy.  Please call back.

## 2012-09-03 NOTE — Telephone Encounter (Signed)
Left message of dr crenshaw's recommendations. 

## 2012-09-03 NOTE — Telephone Encounter (Signed)
Ok to use anesthesia as indicated for colonoscopy. [name]

## 2012-09-03 NOTE — Telephone Encounter (Signed)
Spoke with pt, she has looked up propafol and according to what she has read it can effect arrhythmia's. This is the first time they have used this and she wants to make sure dr Jens Som does not think something else needs to be used instead. Will forward for dr Jens Som review

## 2012-09-04 DIAGNOSIS — L570 Actinic keratosis: Secondary | ICD-10-CM | POA: Diagnosis not present

## 2012-09-04 DIAGNOSIS — L259 Unspecified contact dermatitis, unspecified cause: Secondary | ICD-10-CM | POA: Diagnosis not present

## 2012-09-05 ENCOUNTER — Telehealth: Payer: Self-pay | Admitting: Internal Medicine

## 2012-09-05 ENCOUNTER — Ambulatory Visit (INDEPENDENT_AMBULATORY_CARE_PROVIDER_SITE_OTHER): Payer: Medicare Other | Admitting: Family

## 2012-09-05 ENCOUNTER — Ambulatory Visit (HOSPITAL_BASED_OUTPATIENT_CLINIC_OR_DEPARTMENT_OTHER)
Admission: RE | Admit: 2012-09-05 | Discharge: 2012-09-05 | Disposition: A | Discharge status: Home / Self Care | Payer: Medicare Other | Source: Ambulatory Visit | Attending: Family | Admitting: Family

## 2012-09-05 ENCOUNTER — Encounter: Payer: Self-pay | Admitting: Family

## 2012-09-05 VITALS — BP 130/70 | HR 75 | Temp 98.5°F | Resp 16 | Wt 242.0 lb

## 2012-09-05 DIAGNOSIS — M7989 Other specified soft tissue disorders: Secondary | ICD-10-CM | POA: Diagnosis not present

## 2012-09-05 DIAGNOSIS — R609 Edema, unspecified: Secondary | ICD-10-CM | POA: Diagnosis not present

## 2012-09-05 DIAGNOSIS — R600 Localized edema: Secondary | ICD-10-CM

## 2012-09-05 MED ORDER — DOXYCYCLINE HYCLATE 100 MG PO TABS: 100.0000 mg | ORAL_TABLET | Freq: Two times a day (BID) | ORAL | Status: DC

## 2012-09-05 MED ORDER — FUROSEMIDE 20 MG PO TABS: ORAL_TABLET | ORAL | Status: DC

## 2012-09-05 NOTE — Patient Instructions (Signed)
Please complete your ultrasound on the first floor this afternoon.  Start antibiotics. Star furosemide once daily until swelling is back to "normal" and then as needed. Follow up with Dr. Rodena Medin in 1 week.

## 2012-09-05 NOTE — Telephone Encounter (Signed)
Please call pt and ask her to try to come today at 1:45.

## 2012-09-05 NOTE — Telephone Encounter (Signed)
Caller: Arlena/Patient; Patient Name: [name]; PCP: Marguarite Arbour (Adults only); Best Callback Phone Number: [phone] Swelling to feet and ankles - swelling with redness worse to left ankle. Onset: 08/24/12.  Caller states she traveled to [place] on 08/23/12 and started with swelling to feet on approx. 08/24/12.  States she normally has swelling to feet when she has traveled to [place] in past . Typically , swelling will resolve once she returns from traveling and is able to elevate feet. Patient returned from trip on 09/02/12 and swelling remains. All emergent symptoms ruled out per Edema, Atraumatic guideline with exception " EDEMA NEWLY WORSE THAN USUAL PATTERN  OR NEWLY DEVELOPED IN LAST 12 HOURS". DISPOSITION IS FOR PATIENT TO BE SEEN WITHIN 24 HOURS. THERE ARE NO AVAILABLE APPOINTMENTS. WILL SEND NOTE TO [place] [facility] STAFF TO SEE IF PATIENT CAN BE WORKED INTO CLINIC TO BE SEEN TODAY.

## 2012-09-05 NOTE — Telephone Encounter (Signed)
Reviewed lower extremity ultrasound results  Neg for DVT. Pt notified. Plan furosemide and doxy as we discussed today at visit.

## 2012-09-05 NOTE — Telephone Encounter (Signed)
We only have a 1:45 (new pt) slot open this afternoon. If you would like [place] to bring pt in at that time please let front office know so they can call pt to arrange appt.  Please advise.

## 2012-09-05 NOTE — Progress Notes (Signed)
Subjective:    Patient ID: [name], female    DOB: Jul 10, 1946, 66 y.o.   MRN: [id number]  HPI  [name] is a 66 yr old female who presents today with chief complaint of lower extremity swelling and redness.  She reports that she drove to [place] on 11/2. Started to develop swelling on 11/2.  Reports that her feet remained swollen while on vacation. Since returning home she notes that her feet have remained swollen.  Denies CP, SOB, no calf pain. Denies fever.     Review of Systems See HPI  Past Medical History  Diagnosis Date  . CAD (coronary artery disease)   . Depression   . GERD (gastroesophageal reflux disease)   . Hyperlipidemia   . Hypertension   . Crohn's colitis     she reports ulcerative colitis beginning in her 27's, biopsies 2008 suggest Crohn's not UC  . Pseudoaneurysm of right femoral artery   . Pancreatitis   . Chronic sinusitis   . Renal oncocytoma     s/p right nephrectomy  . Myocardial infarction   . Uveitis   . Anxiety   . Obesity   . Chronic cough   . OSA (obstructive sleep apnea)   . Paroxysmal atrial fibrillation   . Arthritis   . IBS (irritable bowel syndrome)   . Blood transfusion without reported diagnosis 1976    History   Social History  . Marital Status: Married    Spouse Name: N/A    Number of Children: 3  . Years of Education: N/A   Occupational History  .      retired   Social History Main Topics  . Smoking status: Never Smoker   . Smokeless tobacco: Never Used  . Alcohol Use: No  . Drug Use: No  . Sexually Active: Not Currently   Other Topics Concern  . 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 Nephrectomy 10/2006    right secondary to oncocytoma   . Breast biopsy 1996    benign lesion   . Cholecystectomy 10/2006  . Tubal ligation   . Right  renal artery repair 1976  . Right femoral  pseudoaneurysm & right groin hematoma evacuation 02/2007  . Colonoscopy w/ biopsies   . Esophagogastroduodenoscopy   . Coronary angioplasty     Family History  Problem Relation Age of Onset  . Colitis Father   . Hypertension Mother   . Coronary artery disease Mother   . Stroke Mother   . Stroke Brother   . Colon cancer Paternal Aunt   . Clotting disorder Mother   . Clotting disorder Brother   . Clotting disorder Sister   . Heart disease Mother   . Heart disease Father   . Asthma Daughter   . Allergies Mother   . Asthma Maternal Uncle     Allergies  Allergen Reactions  . Cefdinir     Pt cannot take; may interfere with AFib.  . Codeine     Heart beats fast   . Guaifenesin Er Nausea And Vomiting and Other (See Comments)    Headaches; can tolerate liquid.  . Latex     Breathing problems  . Mucilgen (Psyllium)   . Percocet (Oxycodone-Acetaminophen)     Itching & swelling in jaw    Current Outpatient Prescriptions on  File Prior to Visit  Medication Sig Dispense Refill  . amLODipine (NORVASC) 10 MG tablet TAKE 1 TABLET (10 MG TOTAL) BY MOUTH DAILY.  30 tablet  11  . ammonium lactate (LAC-HYDRIN) 12 % lotion Apply 1 application topically daily as needed. For dry skin      . dofetilide (TIKOSYN) 125 MCG capsule Take 3 capsules (375 mcg total) by mouth 2 (two) times daily.  180 capsule  5  . escitalopram (LEXAPRO) 5 MG tablet Take 1 tablet (5 mg total) by mouth daily.  30 tablet  11  . Glucosamine-Chondroitin (GLUCOSAMINE CHONDR COMPLEX PO) Take 1 tablet by mouth at bedtime. Hold while in hospital      . loperamide (IMODIUM A-D) 2 MG tablet Take 1 mg by mouth daily as needed. Dihrrhea      . losartan (COZAAR) 100 MG tablet TAKE 1 TABLET (100 MG TOTAL) BY MOUTH DAILY.  30 tablet  11  . mesalamine (APRISO) 0.375 G 24 hr capsule Take 4 capsules (1.5 g total) by mouth daily.  120 capsule  11  . Multiple Vitamin (MULTIVITAMIN) capsule Take  1 capsule by mouth daily. 1 daily        . nitroGLYCERIN (NITROSTAT) 0.4 MG SL tablet Place 0.4 mg under the tongue every 5 (five) minutes as needed. 1 tab under tongue every 5 min for chest pain not to exceed 3 tabs in 15 min       . Probiotic Product (PHILLIPS COLON HEALTH) CAPS Take 1 capsule by mouth daily. 1 per day      . Rivaroxaban (XARELTO) 20 MG TABS Take 1 tablet (20 mg total) by mouth daily.  30 tablet  4  . esomeprazole (NEXIUM) 40 MG capsule Take 1 capsule (40 mg total) by mouth 2 (two) times daily.  60 capsule  12  . furosemide (LASIX) 20 MG tablet One tablet by mouth once daily as needed for swelling.  30 tablet  0  . metoprolol tartrate (LOPRESSOR) 25 MG tablet TAKE 1/2 TABLET TWICE DAILY  30 tablet  12    BP 130/70  Pulse 75  Temp 98.5 F (36.9 C) (Oral)  Resp 16  Wt 242 lb (109.77 kg)  SpO2 96%       Objective:   Physical Exam  Constitutional: She appears well-developed and well-nourished. No distress.  Cardiovascular: Normal rate and regular rhythm.   No murmur heard. Pulmonary/Chest: Effort normal and breath sounds normal. No respiratory distress. She has no wheezes. She has no rales. She exhibits no tenderness.  Skin:       No significant erythema is noted of legs.           Assessment & Plan:

## 2012-09-10 ENCOUNTER — Encounter: Payer: Self-pay | Admitting: Internal Medicine

## 2012-09-10 ENCOUNTER — Ambulatory Visit (AMBULATORY_SURGERY_CENTER): Payer: Medicare Other | Admitting: Internal Medicine

## 2012-09-10 ENCOUNTER — Encounter: Payer: Medicare Other | Admitting: Rehabilitation

## 2012-09-10 VITALS — BP 139/78 | HR 66 | Temp 97.7°F | Resp 29 | Ht 62.5 in | Wt 238.0 lb

## 2012-09-10 DIAGNOSIS — D126 Benign neoplasm of colon, unspecified: Secondary | ICD-10-CM

## 2012-09-10 DIAGNOSIS — K5010 Crohn's disease of large intestine without complications: Secondary | ICD-10-CM

## 2012-09-10 DIAGNOSIS — K5730 Diverticulosis of large intestine without perforation or abscess without bleeding: Secondary | ICD-10-CM | POA: Diagnosis not present

## 2012-09-10 DIAGNOSIS — K5289 Other specified noninfective gastroenteritis and colitis: Secondary | ICD-10-CM | POA: Diagnosis not present

## 2012-09-10 DIAGNOSIS — I2510 Atherosclerotic heart disease of native coronary artery without angina pectoris: Secondary | ICD-10-CM | POA: Diagnosis not present

## 2012-09-10 DIAGNOSIS — Z1211 Encounter for screening for malignant neoplasm of colon: Secondary | ICD-10-CM | POA: Diagnosis not present

## 2012-09-10 DIAGNOSIS — K5790 Diverticulosis of intestine, part unspecified, without perforation or abscess without bleeding: Secondary | ICD-10-CM

## 2012-09-10 DIAGNOSIS — G4733 Obstructive sleep apnea (adult) (pediatric): Secondary | ICD-10-CM | POA: Diagnosis not present

## 2012-09-10 MED ORDER — SODIUM CHLORIDE 0.9 % IV SOLN: 500.0000 mL | INTRAVENOUS | Status: DC

## 2012-09-10 NOTE — Progress Notes (Signed)
Patient did not experience any of the following events: a burn prior to discharge; a fall within the facility; wrong site/side/patient/procedure/implant event; or a hospital transfer or hospital admission upon discharge from the facility. (G8907) Patient did not have preoperative order for IV antibiotic SSI prophylaxis. (G8918)  

## 2012-09-10 NOTE — Patient Instructions (Addendum)
There were 3 small polyps removed. You still have diverticulosis and hemorrhoids. I did not see any active Crohn's colitis. I will let you know about the results and plans.  Please restart Xarelto.  Thank you for choosing me and [facility].  Iva Boop, MD, [facility]   See handouts given. Resume current medications. Call us with any questions or concerns. Thank you!!  YOU HAD AN ENDOSCOPIC PROCEDURE TODAY AT THE [facility]: Refer to the procedure report that was given to you for any specific questions about what was found during the examination.  If the procedure report does not answer your questions, please call your gastroenterologist to clarify.  If you requested that your care partner not be given the details of your procedure findings, then the procedure report has been included in a sealed envelope for you to review at your convenience later.  YOU SHOULD EXPECT: Some feelings of bloating in the abdomen. Passage of more gas than usual.  Walking can help get rid of the air that was put into your GI tract during the procedure and reduce the bloating. If you had a lower endoscopy (such as a colonoscopy or flexible sigmoidoscopy) you may notice spotting of blood in your stool or on the toilet paper. If you underwent a bowel prep for your procedure, then you may not have a normal bowel movement for a few days.  DIET: Your first meal following the procedure should be a light meal and then it is ok to progress to your normal diet.  A half-sandwich or bowl of soup is an example of a good first meal.  Heavy or fried foods are harder to digest and may make you feel nauseous or bloated.  Likewise meals heavy in dairy and vegetables can cause extra gas to form and this can also increase the bloating.  Drink plenty of fluids but you should avoid alcoholic beverages for 24 hours.  ACTIVITY: Your care partner should take you home directly after the procedure.  You should plan to  take it easy, moving slowly for the rest of the day.  You can resume normal activity the day after the procedure however you should NOT DRIVE or use heavy machinery for 24 hours (because of the sedation medicines used during the test).    SYMPTOMS TO REPORT IMMEDIATELY: A gastroenterologist can be reached at any hour.  During normal business hours, 8:30 AM to 5:00 PM Monday through Friday, call [phone].  After hours and on weekends, please call the GI answering service at [phone] who will take a message and have the physician on call contact you.   Following lower endoscopy (colonoscopy or flexible sigmoidoscopy):  Excessive amounts of blood in the stool  Significant tenderness or worsening of abdominal pains  Swelling of the abdomen that is new, acute  Fever of 100F or higher  FOLLOW UP: If any biopsies were taken you will be contacted by phone or by letter within the next 1-3 weeks.  Call your gastroenterologist if you have not heard about the biopsies in 3 weeks.  Our staff will call the home number listed on your records the next business day following your procedure to check on you and address any questions or concerns that you may have at that time regarding the information given to you following your procedure. This is a courtesy call and so if there is no answer at the home number and we have not heard from you through the emergency  physician on call, we will assume that you have returned to your regular daily activities without incident.  SIGNATURES/CONFIDENTIALITY: You and/or your care partner have signed paperwork which will be entered into your electronic medical record.  These signatures attest to the fact that that the information above on your After Visit Summary has been reviewed and is understood.  Full responsibility of the confidentiality of this discharge information lies with you and/or your care-partner.

## 2012-09-10 NOTE — Op Note (Signed)
[facility] [address] [place], [address]   COLONOSCOPY PROCEDURE REPORT  PATIENT: [name], [name]  MR#: [id number] BIRTHDATE: 10/01/1946 , 66  yrs. old GENDER: Female ENDOSCOPIST: Iva Boop, MD, [facility] PROCEDURE DATE:  09/10/2012 PROCEDURE:   Colonoscopy with biopsy and Colonoscopy with snare polypectomy ASA CLASS:   Class III INDICATIONS:High risk patient with previously diagnosed Crohn's disease: large intestine. MEDICATIONS: Propofol (Diprivan) 270 mg IV, MAC sedation, administered by CRNA, and These medications were titrated to patient response per physician's verbal order  DESCRIPTION OF PROCEDURE:   After the risks benefits and alternatives of the procedure were thoroughly explained, informed consent was obtained.  A digital rectal exam revealed no abnormalities of the rectum.   The LB [id number]  endoscope was introduced through the anus and advanced to the cecum, which was identified by both the appendix and ileocecal valve. No adverse events experienced.   The quality of the prep was Suprep excellent The instrument was then slowly withdrawn as the colon was fully examined.      COLON FINDINGS: Three diminutive polypoid shaped sessile polyps were found in the transverse colon and descending colon.  A polypectomy was performed with cold forceps and with a cold snare.  The resection was complete and the polyp tissue was completely retrieved.   Severe diverticulosis was noted The finding was in the left colon.   The colon mucosa was otherwise normal and random surveillance biopsies were taken.  Retroflexed views revealed internal hemorrhoids. The time to cecum=3 minutes 44 seconds. Withdrawal time=15 minutes 39 seconds.  The scope was withdrawn and the procedure completed. COMPLICATIONS: There were no complications.  ENDOSCOPIC IMPRESSION: 1.   Three diminutive sessile polyps were found in the transverse colon and descending  colon; polypectomy was performed with cold forceps and with a cold snare 2.   Severe diverticulosis was noted in the left colon 3.   Small-medium internal hemorrhoids 4.   The colon mucosa was otherwise normal with excellent prep - Crohn's appears in remission  RECOMMENDATIONS: Timing of repeat colonoscopy will be determined by pathology findings.   eSigned:  Iva Boop, MD, [facility] 09/10/2012 3:02 PM   cc: The Patient

## 2012-09-10 NOTE — Progress Notes (Signed)
Pt stable  HOB elevated Pt awake Report to [employer], Charity fundraiser

## 2012-09-11 ENCOUNTER — Other Ambulatory Visit: Payer: Self-pay | Admitting: *Deleted

## 2012-09-11 ENCOUNTER — Telehealth: Payer: Self-pay | Admitting: *Deleted

## 2012-09-11 DIAGNOSIS — K219 Gastro-esophageal reflux disease without esophagitis: Secondary | ICD-10-CM

## 2012-09-11 MED ORDER — METOPROLOL TARTRATE 25 MG PO TABS: ORAL_TABLET | ORAL | Status: DC

## 2012-09-11 MED ORDER — ESOMEPRAZOLE MAGNESIUM 40 MG PO CPDR: 40.0000 mg | DELAYED_RELEASE_CAPSULE | Freq: Two times a day (BID) | ORAL | Status: DC

## 2012-09-11 NOTE — Telephone Encounter (Signed)
  Follow up Call-  Call back number 09/10/2012  Post procedure Call Back phone  # [phone]  Permission to leave phone message Yes     Patient questions:  Message left to call me if necessary.

## 2012-09-12 ENCOUNTER — Encounter: Payer: Self-pay | Admitting: Internal Medicine

## 2012-09-12 ENCOUNTER — Ambulatory Visit: Payer: Medicare Other | Admitting: Rehabilitation

## 2012-09-12 ENCOUNTER — Ambulatory Visit (INDEPENDENT_AMBULATORY_CARE_PROVIDER_SITE_OTHER): Payer: Medicare Other | Admitting: Internal Medicine

## 2012-09-12 VITALS — BP 134/84 | HR 59 | Temp 97.8°F | Resp 16 | Wt 240.0 lb

## 2012-09-12 DIAGNOSIS — IMO0001 Reserved for inherently not codable concepts without codable children: Secondary | ICD-10-CM | POA: Diagnosis not present

## 2012-09-12 DIAGNOSIS — M25569 Pain in unspecified knee: Secondary | ICD-10-CM | POA: Diagnosis not present

## 2012-09-12 DIAGNOSIS — R609 Edema, unspecified: Secondary | ICD-10-CM | POA: Diagnosis not present

## 2012-09-12 DIAGNOSIS — M545 Low back pain: Secondary | ICD-10-CM | POA: Diagnosis not present

## 2012-09-12 DIAGNOSIS — R600 Localized edema: Secondary | ICD-10-CM

## 2012-09-12 NOTE — Progress Notes (Signed)
  Subjective:    Patient ID: [name], female    DOB: [birth year], 66 y.o.   MRN: [id number]  HPI patient presents to clinic for followup blood sugar swelling. Bilateral extremity swelling recently developed during and after out-of-state trip. Ultrasound negative for DVT. Did not take Lasix. Symptoms resolved entirely.  Past Medical History  Diagnosis Date  . CAD (coronary artery disease)   . Depression   . GERD (gastroesophageal reflux disease)   . Hyperlipidemia   . Hypertension   . Crohn's colitis     she reports ulcerative colitis beginning in her 35's, biopsies 2008 suggest Crohn's not UC  . Pseudoaneurysm of right femoral artery   . Pancreatitis   . Chronic sinusitis   . Renal oncocytoma     s/p right nephrectomy  . Myocardial infarction   . Uveitis   . Anxiety   . Obesity   . Chronic cough   . OSA (obstructive sleep apnea)   . Paroxysmal atrial fibrillation   . Arthritis   . IBS (irritable bowel syndrome)   . Blood transfusion without reported diagnosis 1976   Past Surgical History  Procedure Date  . Nephrectomy 10/2006    right secondary to oncocytoma   . Breast biopsy 1996    benign lesion   . Cholecystectomy 10/2006  . Tubal ligation   . Right renal artery repair 1976  . Right femoral  pseudoaneurysm & right groin hematoma evacuation 02/2007  . Colonoscopy w/ biopsies   . Esophagogastroduodenoscopy   . Coronary angioplasty     reports that she has never smoked. She has never used smokeless tobacco. She reports that she does not drink alcohol or use illicit drugs. family history includes Allergies in her mother; Asthma in her daughter and maternal uncle; Clotting disorder in her brother, mother, and sister; Colitis in her father; Colon cancer in her paternal aunt; Coronary artery disease in her mother; Heart disease in her father and mother; Hypertension in her mother; and Stroke in her brother and mother. Allergies  Allergen Reactions  . Cefdinir     Pt  cannot take; may interfere with AFib.  . Codeine     Heart beats fast   . Guaifenesin Er Nausea And Vomiting and Other (See Comments)    Headaches; can tolerate liquid.  . Latex     Breathing problems  . Mucilgen (Psyllium)   . Percocet (Oxycodone-Acetaminophen)     Itching & swelling in jaw    Review of Systems see hpi     Objective:   Physical Exam  Nursing note and vitals reviewed. Constitutional: She appears well-developed and well-nourished. No distress.  HENT:  Head: Normocephalic and atraumatic.  Neurological: She is alert.  Skin: Skin is warm and dry. She is not diaphoretic.       Bilateral left greater right mild hyperpigmentation consistent with venous insufficiency.  Psychiatric: She has a normal mood and affect.          Assessment & Plan:

## 2012-09-12 NOTE — Assessment & Plan Note (Signed)
Resolved. Ultrasound negative for DVT. Suspect venous insufficiency. Elevate legs when possible. Use compression hose.

## 2012-09-13 ENCOUNTER — Ambulatory Visit: Payer: Medicare Other | Admitting: Internal Medicine

## 2012-09-14 NOTE — Assessment & Plan Note (Addendum)
Likely exacerbated by recent car ride.  Bilateral LE doppler obtained is negative for DVT.  Add prn furosemide.

## 2012-09-16 ENCOUNTER — Encounter: Payer: Self-pay | Admitting: Internal Medicine

## 2012-09-16 NOTE — Progress Notes (Signed)
Quick Note:  3 adenomas (diminutive) No active colitis and no dysplasia Repeat colonoscopy 2 years 08/2014 ______

## 2012-09-17 ENCOUNTER — Ambulatory Visit: Payer: Medicare Other | Admitting: Rehabilitation

## 2012-09-17 DIAGNOSIS — IMO0001 Reserved for inherently not codable concepts without codable children: Secondary | ICD-10-CM | POA: Diagnosis not present

## 2012-09-17 DIAGNOSIS — M25569 Pain in unspecified knee: Secondary | ICD-10-CM | POA: Diagnosis not present

## 2012-09-17 DIAGNOSIS — M545 Low back pain: Secondary | ICD-10-CM | POA: Diagnosis not present

## 2012-09-24 ENCOUNTER — Ambulatory Visit: Payer: Medicare Other | Attending: Internal Medicine | Admitting: Rehabilitation

## 2012-09-24 DIAGNOSIS — M25569 Pain in unspecified knee: Secondary | ICD-10-CM | POA: Diagnosis not present

## 2012-09-24 DIAGNOSIS — IMO0001 Reserved for inherently not codable concepts without codable children: Secondary | ICD-10-CM | POA: Diagnosis not present

## 2012-09-24 DIAGNOSIS — M545 Low back pain, unspecified: Secondary | ICD-10-CM | POA: Diagnosis not present

## 2012-10-01 ENCOUNTER — Ambulatory Visit: Payer: Medicare Other | Admitting: Rehabilitation

## 2012-10-05 ENCOUNTER — Ambulatory Visit: Payer: Medicare Other | Admitting: Rehabilitation

## 2012-10-22 ENCOUNTER — Ambulatory Visit: Payer: Medicare Other | Admitting: Rehabilitation

## 2012-10-26 ENCOUNTER — Other Ambulatory Visit: Payer: Self-pay | Admitting: Cardiology

## 2012-10-29 ENCOUNTER — Ambulatory Visit: Payer: Medicare Other | Admitting: Rehabilitation

## 2012-11-02 ENCOUNTER — Encounter: Payer: Self-pay | Admitting: Family

## 2012-11-02 ENCOUNTER — Ambulatory Visit (INDEPENDENT_AMBULATORY_CARE_PROVIDER_SITE_OTHER): Payer: Medicare Other | Admitting: Family

## 2012-11-02 VITALS — BP 116/78 | HR 60 | Temp 98.1°F | Resp 16 | Ht 63.25 in | Wt 237.1 lb

## 2012-11-02 DIAGNOSIS — J40 Bronchitis, not specified as acute or chronic: Secondary | ICD-10-CM | POA: Diagnosis not present

## 2012-11-02 MED ORDER — BENZONATATE 100 MG PO CAPS: 100.0000 mg | ORAL_CAPSULE | Freq: Three times a day (TID) | ORAL | Status: AC | PRN

## 2012-11-02 MED ORDER — AMOXICILLIN-POT CLAVULANATE 875-125 MG PO TABS: 1.0000 | ORAL_TABLET | Freq: Two times a day (BID) | ORAL | Status: DC

## 2012-11-02 NOTE — Assessment & Plan Note (Signed)
Will rx with augmentin and tessalon.

## 2012-11-02 NOTE — Progress Notes (Signed)
Subjective:    Patient ID: [name], female    DOB: 16-Dec-1945, 67 y.o.   MRN: [id number]  HPI  Ms.  [name] is a 67 yr old female who presents today with chief complaint of cough.  Cough started 1 week ago.  Started with sore throat and sneezing.  She reports associated nasal drainage which really bad when she lays down.   Has to sleep propped up. Denies associated fever.  Cough is not improved by zicam or saline spray.    Review of Systems See HPI  Past Medical History  Diagnosis Date  . CAD (coronary artery disease)   . Depression   . GERD (gastroesophageal reflux disease)   . Hyperlipidemia   . Hypertension   . Crohn's colitis     she reports ulcerative colitis beginning in her 55's, biopsies 2008 suggest Crohn's not UC  . Pseudoaneurysm of right femoral artery   . Pancreatitis   . Chronic sinusitis   . Renal oncocytoma     s/p right nephrectomy  . Myocardial infarction   . Uveitis   . Anxiety   . Obesity   . Chronic cough   . OSA (obstructive sleep apnea)   . Paroxysmal atrial fibrillation   . Arthritis   . IBS (irritable bowel syndrome)   . Blood transfusion without reported diagnosis 1976    History   Social History  . Marital Status: Married    Spouse Name: N/A    Number of Children: 3  . Years of Education: N/A   Occupational History  .      retired   Social History Main Topics  . Smoking status: Never Smoker   . Smokeless tobacco: Never Used  . Alcohol Use: No  . Drug Use: No  . Sexually Active: Not Currently   Other Topics Concern  . 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 Nephrectomy 10/2006    right secondary to oncocytoma   . Breast biopsy 1996    benign lesion   . Cholecystectomy 10/2006  . Tubal ligation   . Right renal artery repair 1976  . Right femoral   pseudoaneurysm & right groin hematoma evacuation 02/2007  . Colonoscopy w/ biopsies   . Esophagogastroduodenoscopy   . Coronary angioplasty     Family History  Problem Relation Age of Onset  . Colitis Father   . Hypertension Mother   . Coronary artery disease Mother   . Stroke Mother   . Stroke Brother   . Colon cancer Paternal Aunt   . Clotting disorder Mother   . Clotting disorder Brother   . Clotting disorder Sister   . Heart disease Mother   . Heart disease Father   . Asthma Daughter   . Allergies Mother   . Asthma Maternal Uncle     Allergies  Allergen Reactions  . Cefdinir     Pt cannot take; may interfere with AFib.  . Codeine     Heart beats fast   . Guaifenesin Er Nausea And Vomiting and Other (See Comments)    Headaches; can tolerate liquid.  . Latex     Breathing problems  . Mucilgen (Psyllium)   . Percocet (Oxycodone-Acetaminophen)     Itching & swelling in jaw    Current Outpatient Prescriptions on File Prior to Visit  Medication Sig Dispense Refill  . amLODipine (NORVASC) 10 MG tablet TAKE 1 TABLET (10 MG TOTAL) BY MOUTH DAILY.  30 tablet  11  . ammonium lactate (LAC-HYDRIN) 12 % lotion Apply 1 application topically daily as needed. For dry skin      . dofetilide (TIKOSYN) 125 MCG capsule Take 3 capsules (375 mcg total) by mouth 2 (two) times daily.  180 capsule  5  . escitalopram (LEXAPRO) 5 MG tablet Take 1 tablet (5 mg total) by mouth daily.  30 tablet  11  . esomeprazole (NEXIUM) 40 MG capsule Take 1 capsule (40 mg total) by mouth 2 (two) times daily.  60 capsule  12  . Glucosamine-Chondroitin (GLUCOSAMINE CHONDR COMPLEX PO) Take 1 tablet by mouth at bedtime. Hold while in hospital      . loperamide (IMODIUM A-D) 2 MG tablet Take 1 mg by mouth daily as needed. Dihrrhea      . losartan (COZAAR) 100 MG tablet TAKE 1 TABLET (100 MG TOTAL) BY MOUTH DAILY.  30 tablet  11  . mesalamine (APRISO) 0.375 G 24 hr capsule Take 4 capsules (1.5 g total) by mouth  daily.  120 capsule  11  . metoprolol tartrate (LOPRESSOR) 25 MG tablet TAKE 1/2 TABLET TWICE DAILY  30 tablet  12  . Multiple Vitamin (MULTIVITAMIN) capsule Take 1 capsule by mouth daily. 1 daily        . NEXIUM 40 MG capsule TAKE ONE CAPSULE BY MOUTH TWICE A DAY  60 capsule  12  . nitroGLYCERIN (NITROSTAT) 0.4 MG SL tablet Place 0.4 mg under the tongue every 5 (five) minutes as needed. 1 tab under tongue every 5 min for chest pain not to exceed 3 tabs in 15 min       . Probiotic Product (PHILLIPS COLON HEALTH) CAPS Take 1 capsule by mouth daily. 1 per day      . Rivaroxaban (XARELTO) 20 MG TABS Take 1 tablet (20 mg total) by mouth daily.  30 tablet  4    BP 116/78  Pulse 60  Temp 98.1 F (36.7 C) (Oral)  Resp 16  Ht 5' 3.25" (1.607 m)  Wt 237 lb 1.3 oz (107.539 kg)  BMI 41.67 kg/m2  SpO2 98%       Objective:   Physical Exam  Constitutional: She is oriented to person, place, and time. She appears well-developed and well-nourished.       Morbidly obese white female  HENT:  Head: Normocephalic and atraumatic.  Right Ear: Tympanic membrane and ear canal normal.  Left Ear: Tympanic membrane and ear canal normal.  Mouth/Throat: No oropharyngeal exudate or posterior oropharyngeal edema.  Cardiovascular: Normal rate and regular rhythm.   No murmur heard. Pulmonary/Chest: Effort normal and breath sounds normal. No respiratory distress. She has no wheezes. She has no rales. She exhibits no tenderness.  Lymphadenopathy:    She has no cervical adenopathy.  Neurological: She is alert and oriented to person, place, and time.  Skin: Skin is warm and dry.  Psychiatric: She has a normal mood and affect. Her behavior is normal. Judgment and thought content normal.          Assessment & Plan:

## 2012-11-02 NOTE — Patient Instructions (Addendum)
Please call if symptoms worsen or if no improvement in 2-3 days.  

## 2012-11-05 ENCOUNTER — Other Ambulatory Visit: Payer: Self-pay | Admitting: Internal Medicine

## 2012-11-05 ENCOUNTER — Ambulatory Visit: Payer: Medicare Other | Attending: Internal Medicine | Admitting: Rehabilitation

## 2012-11-05 DIAGNOSIS — M25569 Pain in unspecified knee: Secondary | ICD-10-CM | POA: Diagnosis not present

## 2012-11-05 DIAGNOSIS — M545 Low back pain, unspecified: Secondary | ICD-10-CM | POA: Insufficient documentation

## 2012-11-05 DIAGNOSIS — IMO0001 Reserved for inherently not codable concepts without codable children: Secondary | ICD-10-CM | POA: Diagnosis not present

## 2012-11-05 NOTE — Telephone Encounter (Signed)
Lexapro request [Last Rx #[id number] 02.01.13]/SLS Please advise.

## 2012-11-12 ENCOUNTER — Ambulatory Visit: Payer: Medicare Other | Admitting: Rehabilitation

## 2012-11-19 ENCOUNTER — Ambulatory Visit: Payer: Medicare Other | Admitting: Rehabilitation

## 2012-11-21 ENCOUNTER — Ambulatory Visit: Payer: Medicare Other | Admitting: Rehabilitation

## 2012-11-26 ENCOUNTER — Ambulatory Visit: Payer: Medicare Other | Attending: Internal Medicine | Admitting: Rehabilitation

## 2012-11-26 DIAGNOSIS — M545 Low back pain, unspecified: Secondary | ICD-10-CM | POA: Insufficient documentation

## 2012-11-26 DIAGNOSIS — M25569 Pain in unspecified knee: Secondary | ICD-10-CM | POA: Diagnosis not present

## 2012-11-26 DIAGNOSIS — IMO0001 Reserved for inherently not codable concepts without codable children: Secondary | ICD-10-CM | POA: Diagnosis not present

## 2012-12-03 ENCOUNTER — Ambulatory Visit: Payer: Medicare Other | Admitting: Rehabilitation

## 2012-12-10 ENCOUNTER — Ambulatory Visit: Payer: Medicare Other | Admitting: Rehabilitation

## 2012-12-10 ENCOUNTER — Ambulatory Visit (INDEPENDENT_AMBULATORY_CARE_PROVIDER_SITE_OTHER): Payer: Medicare Other | Admitting: Family Medicine

## 2012-12-10 ENCOUNTER — Encounter: Payer: Self-pay | Admitting: Family Medicine

## 2012-12-10 VITALS — BP 126/82 | HR 69 | Temp 97.4°F | Ht 63.25 in | Wt 235.0 lb

## 2012-12-10 DIAGNOSIS — E785 Hyperlipidemia, unspecified: Secondary | ICD-10-CM

## 2012-12-10 DIAGNOSIS — R32 Unspecified urinary incontinence: Secondary | ICD-10-CM

## 2012-12-10 DIAGNOSIS — J309 Allergic rhinitis, unspecified: Secondary | ICD-10-CM

## 2012-12-10 DIAGNOSIS — G4733 Obstructive sleep apnea (adult) (pediatric): Secondary | ICD-10-CM

## 2012-12-10 DIAGNOSIS — L910 Hypertrophic scar: Secondary | ICD-10-CM | POA: Insufficient documentation

## 2012-12-10 DIAGNOSIS — F329 Major depressive disorder, single episode, unspecified: Secondary | ICD-10-CM

## 2012-12-10 DIAGNOSIS — Z905 Acquired absence of kidney: Secondary | ICD-10-CM

## 2012-12-10 DIAGNOSIS — N259 Disorder resulting from impaired renal tubular function, unspecified: Secondary | ICD-10-CM | POA: Diagnosis not present

## 2012-12-10 DIAGNOSIS — G4730 Sleep apnea, unspecified: Secondary | ICD-10-CM | POA: Insufficient documentation

## 2012-12-10 DIAGNOSIS — I10 Essential (primary) hypertension: Secondary | ICD-10-CM

## 2012-12-10 MED ORDER — ESCITALOPRAM OXALATE 10 MG PO TABS: 10.0000 mg | ORAL_TABLET | Freq: Every day | ORAL | Status: DC

## 2012-12-10 NOTE — Patient Instructions (Addendum)
Claritin daily Call if still cannot sleep for further medications  Labs prior to visit, lipid, renal, cbc, tsh, hepatic  Allergies, Generic Allergies may happen from anything your body is sensitive to. This may be food, medicines, pollens, chemicals, and nearly anything around you in everyday life that produces allergens. An allergen is anything that causes an allergy producing substance. Heredity is often a factor in causing these problems. This means you may have some of the same allergies as your parents. Food allergies happen in all age groups. Food allergies are some of the most severe and life threatening. Some common food allergies are cow's milk, seafood, eggs, nuts, wheat, and soybeans. SYMPTOMS   Swelling around the mouth.  An itchy red rash or hives.  Vomiting or diarrhea.  Difficulty breathing. SEVERE ALLERGIC REACTIONS ARE LIFE-THREATENING. This reaction is called anaphylaxis. It can cause the mouth and throat to swell and cause difficulty with breathing and swallowing. In severe reactions only a trace amount of food (for example, peanut oil in a salad) may cause death within seconds. Seasonal allergies occur in all age groups. These are seasonal because they usually occur during the same season every year. They may be a reaction to molds, grass pollens, or tree pollens. Other causes of problems are house dust mite allergens, pet dander, and mold spores. The symptoms often consist of nasal congestion, a runny itchy nose associated with sneezing, and tearing itchy eyes. There is often an associated itching of the mouth and ears. The problems happen when you come in contact with pollens and other allergens. Allergens are the particles in the air that the body reacts to with an allergic reaction. This causes you to release allergic antibodies. Through a chain of events, these eventually cause you to release histamine into the blood stream. Although it is meant to be protective to the  body, it is this release that causes your discomfort. This is why you were given anti-histamines to feel better. If you are unable to pinpoint the offending allergen, it may be determined by skin or blood testing. Allergies cannot be cured but can be controlled with medicine. Hay fever is a collection of all or some of the seasonal allergy problems. It may often be treated with simple over-the-counter medicine such as diphenhydramine. Take medicine as directed. Do not drink alcohol or drive while taking this medicine. Check with your caregiver or package insert for child dosages. If these medicines are not effective, there are many new medicines your caregiver can prescribe. Stronger medicine such as nasal spray, eye drops, and corticosteroids may be used if the first things you try do not work well. Other treatments such as immunotherapy or desensitizing injections can be used if all else fails. Follow up with your caregiver if problems continue. These seasonal allergies are usually not life threatening. They are generally more of a nuisance that can often be handled using medicine. HOME CARE INSTRUCTIONS   If unsure what causes a reaction, keep a diary of foods eaten and symptoms that follow. Avoid foods that cause reactions.  If hives or rash are present:  Take medicine as directed.  You may use an over-the-counter antihistamine (diphenhydramine) for hives and itching as needed.  Apply cold compresses (cloths) to the skin or take baths in cool water. Avoid hot baths or showers. Heat will make a rash and itching worse.  If you are severely allergic:  Following a treatment for a severe reaction, hospitalization is often required for closer follow-up.  Wear a medic-alert bracelet or necklace stating the allergy.  You and your family must learn how to give adrenaline or use an anaphylaxis kit.  If you have had a severe reaction, always carry your anaphylaxis kit or EpiPen with you. Use this  medicine as directed by your caregiver if a severe reaction is occurring. Failure to do so could have a fatal outcome. SEEK MEDICAL CARE IF:  You suspect a food allergy. Symptoms generally happen within 30 minutes of eating a food.  Your symptoms have not gone away within 2 days or are getting worse.  You develop new symptoms.  You want to retest yourself or your child with a food or drink you think causes an allergic reaction. Never do this if an anaphylactic reaction to that food or drink has happened before. Only do this under the care of a caregiver. SEEK IMMEDIATE MEDICAL CARE IF:   You have difficulty breathing, are wheezing, or have a tight feeling in your chest or throat.  You have a swollen mouth, or you have hives, swelling, or itching all over your body.  You have had a severe reaction that has responded to your anaphylaxis kit or an EpiPen. These reactions may return when the medicine has worn off. These reactions should be considered life threatening. MAKE SURE YOU:   Understand these instructions.  Will watch your condition.  Will get help right away if you are not doing well or get worse. Document Released: 01/03/2003 Document Revised: 01/02/2012 Document Reviewed: 06/09/2008 [facility] Patient Information 2013 [employer], [place].

## 2012-12-10 NOTE — Assessment & Plan Note (Signed)
Compliant with treatment 

## 2012-12-10 NOTE — Assessment & Plan Note (Signed)
Is requesting referral to new urologist, will arrange consultation with [facility]

## 2012-12-16 NOTE — Assessment & Plan Note (Signed)
Encouraged claritin and nasal saline daily

## 2012-12-16 NOTE — Progress Notes (Signed)
Patient ID: [name], female   DOB: 02/12/1946, 67 y.o.   MRN: [id number] [name] [id number] [birth year] 12/16/2012      Progress Note-Follow Up  Subjective  Chief Complaint  Chief Complaint  Patient presents with  . Follow-up    3 month    HPI  Patient is a 67 year old female who is in today for followup he is struggles with some on the low back pain has no acute new complaints today. Allergies are persistent and not taking any meds. Some congestion is noted. No fevers or chills. No GI or GU complaints. No chest pain, palpitations, shortness of breath. Taking medications as prescribed.  Past Medical History  Diagnosis Date  . CAD (coronary artery disease)   . Depression   . GERD (gastroesophageal reflux disease)   . Hyperlipidemia   . Hypertension   . Crohn's colitis     she reports ulcerative colitis beginning in her 38's, biopsies 2008 suggest Crohn's not UC  . Pseudoaneurysm of right femoral artery   . Pancreatitis   . Chronic sinusitis   . Renal oncocytoma     s/p right nephrectomy  . Myocardial infarction   . Uveitis   . Anxiety   . Obesity   . Chronic cough   . OSA (obstructive sleep apnea)   . Paroxysmal atrial fibrillation   . Arthritis   . IBS (irritable bowel syndrome)   . Blood transfusion without reported diagnosis 1976  . Sleep apnea   . Keloid of skin     Past Surgical History  Procedure Laterality Date  . Nephrectomy  10/2006    right secondary to oncocytoma   . Breast biopsy  1996    benign lesion   . Cholecystectomy  10/2006  . Tubal ligation    . Right renal artery repair  1976  . Right femoral  pseudoaneurysm & right groin hematoma evacuation  02/2007  . Colonoscopy w/ biopsies    . Esophagogastroduodenoscopy    . Coronary angioplasty      Family History  Problem Relation Age of Onset  . Colitis Father   . Hypertension Mother   . Coronary artery disease Mother   . Stroke Mother   . Stroke Brother   . Colon cancer  Paternal Aunt   . Clotting disorder Mother   . Clotting disorder Brother   . Clotting disorder Sister   . Heart disease Mother   . Heart disease Father   . Asthma Daughter   . Allergies Mother   . Asthma Maternal Uncle     History   Social History  . Marital Status: Married    Spouse Name: N/A    Number of Children: 3  . Years of Education: N/A   Occupational History  .      retired   Social History Main Topics  . Smoking status: Never Smoker   . Smokeless tobacco: Never Used  . Alcohol Use: No  . Drug Use: No  . Sexually Active: Not Currently   Other Topics Concern  . 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 amLODipine (NORVASC) 10 MG tablet TAKE 1 TABLET (10 MG TOTAL) BY MOUTH DAILY.  30 tablet  11  .  ammonium lactate (LAC-HYDRIN) 12 % lotion Apply 1 application topically daily as needed. For dry skin      . dofetilide (TIKOSYN) 125 MCG capsule Take 3 capsules (375 mcg total) by mouth 2 (two) times daily.  180 capsule  5  . esomeprazole (NEXIUM) 40 MG capsule Take 1 capsule (40 mg total) by mouth 2 (two) times daily.  60 capsule  12  . Glucosamine-Chondroitin (GLUCOSAMINE CHONDR COMPLEX PO) Take 1 tablet by mouth at bedtime. Hold while in hospital      . loperamide (IMODIUM A-D) 2 MG tablet Take 1 mg by mouth daily as needed. Dihrrhea      . losartan (COZAAR) 100 MG tablet TAKE 1 TABLET (100 MG TOTAL) BY MOUTH DAILY.  30 tablet  11  . mesalamine (APRISO) 0.375 G 24 hr capsule Take 4 capsules (1.5 g total) by mouth daily.  120 capsule  11  . metoprolol tartrate (LOPRESSOR) 25 MG tablet TAKE 1/2 TABLET TWICE DAILY  30 tablet  12  . Multiple Vitamin (MULTIVITAMIN) capsule Take 1 capsule by mouth daily. 1 daily        . Probiotic Product  (PHILLIPS COLON HEALTH) CAPS Take 1 capsule by mouth daily. 1 per day      . Rivaroxaban (XARELTO) 20 MG TABS Take 1 tablet (20 mg total) by mouth daily.  30 tablet  4  . nitroGLYCERIN (NITROSTAT) 0.4 MG SL tablet Place 0.4 mg under the tongue every 5 (five) minutes as needed. 1 tab under tongue every 5 min for chest pain not to exceed 3 tabs in 15 min        No current facility-administered medications on file prior to visit.    Allergies  Allergen Reactions  . Cefdinir     Pt cannot take; may interfere with AFib.  . Codeine     Heart beats fast   . Guaifenesin Er Nausea And Vomiting and Other (See Comments)    Headaches; can tolerate liquid.  . Latex     Breathing problems  . Mucilgen (Psyllium)   . Percocet (Oxycodone-Acetaminophen)     Itching & swelling in jaw    Review of Systems  Review of Systems  Constitutional: Negative for fever and malaise/fatigue.  HENT: Negative for congestion.   Eyes: Negative for discharge.  Respiratory: Negative for shortness of breath.   Cardiovascular: Negative for chest pain, palpitations and leg swelling.  Gastrointestinal: Negative for nausea, abdominal pain and diarrhea.  Genitourinary: Positive for frequency. Negative for dysuria.  Musculoskeletal: Positive for back pain. Negative for falls.  Skin: Negative for rash.  Neurological: Negative for loss of consciousness and headaches.  Endo/Heme/Allergies: Negative for polydipsia.  Psychiatric/Behavioral: Negative for depression and suicidal ideas. The patient is not nervous/anxious and does not have insomnia.     Objective  BP 126/82  Pulse 69  Temp(Src) 97.4 F (36.3 C) (Oral)  Ht 5' 3.25" (1.607 m)  Wt 235 lb (106.595 kg)  BMI 41.28 kg/m2  SpO2 95%  Physical Exam  Physical Exam  Constitutional: She is oriented to person, place, and time and well-developed, well-nourished, and in no distress. No distress.  HENT:  Head: Normocephalic and atraumatic.  Eyes: Conjunctivae are  normal.  Neck: Neck supple. No thyromegaly present.  Cardiovascular: Normal rate, regular rhythm and normal heart sounds.   No murmur heard. Pulmonary/Chest: Effort normal and breath sounds normal. She has no wheezes.  Abdominal: She exhibits no distension and no mass.  Musculoskeletal: She exhibits no edema.  Lymphadenopathy:    She has no cervical adenopathy.  Neurological: She is alert and oriented to person, place, and time.  Skin: Skin is warm and dry. No rash noted. She is not diaphoretic.  Psychiatric: Memory, affect and judgment normal.    Lab Results  Component Value Date   TSH 3.766 06/24/2011   Lab Results  Component Value Date   WBC 9.4 06/04/2012   HGB 13.1 06/04/2012   HCT 40.5 06/04/2012   MCV 91.6 06/04/2012   PLT 284 06/04/2012   Lab Results  Component Value Date   CREATININE 1.00 05/25/2012   BUN 20 05/25/2012   NA 137 05/25/2012   K 4.3 05/25/2012   CL 100 05/25/2012   CO2 25 05/25/2012   Lab Results  Component Value Date   ALT 12 05/25/2012   AST 19 05/25/2012   ALKPHOS 101 05/25/2012   BILITOT 0.3 05/25/2012   Lab Results  Component Value Date   CHOL  Value: 188        ATP III CLASSIFICATION:  <200     mg/dL   Desirable  161-096  mg/dL   Borderline High  >=045    mg/dL   High        4/0/9811   Lab Results  Component Value Date   HDL 25* 02/27/2009   Lab Results  Component Value Date   LDLCALC  Value: 136        Total Cholesterol/HDL:CHD Risk Coronary Heart Disease Risk Table                     Men   Women  1/2 Average Risk   3.4   3.3  Average Risk       5.0   4.4  2 X Average Risk   9.6   7.1  3 X Average Risk  23.4   11.0        Use the calculated Patient Ratio above and the CHD Risk Table to determine the patient's CHD Risk.        ATP III CLASSIFICATION (LDL):  <100     mg/dL   Optimal  914-782  mg/dL   Near or Above                    Optimal  130-159  mg/dL   Borderline  956-213  mg/dL   High  >086     mg/dL   Very High* 02/28/8468   Lab Results  Component Value  Date   TRIG 135 02/27/2009   Lab Results  Component Value Date   CHOLHDL 7.5 02/27/2009     Assessment & Plan  RENAL INSUFFICIENCY Is requesting referral to new urologist, will arrange consultation with [facility]  SLEEP APNEA, OBSTRUCTIVE Compliant with treatment  ALLERGIC RHINITIS Encouraged claritin and nasal saline daily  HYPERTENSION Well controlled no changes to meds today  HYPERLIPIDEMIA Mild intolertant to statins, avoid trans fats, start krill oil caps

## 2012-12-16 NOTE — Assessment & Plan Note (Signed)
Mild intolertant to statins, avoid trans fats, start krill oil caps

## 2012-12-16 NOTE — Assessment & Plan Note (Signed)
Well controlled no changes to meds today 

## 2012-12-17 ENCOUNTER — Ambulatory Visit: Payer: Medicare Other | Admitting: Rehabilitation

## 2012-12-18 ENCOUNTER — Other Ambulatory Visit: Payer: Self-pay | Admitting: Cardiology

## 2012-12-24 ENCOUNTER — Encounter: Payer: Medicare Other | Admitting: Rehabilitation

## 2012-12-25 ENCOUNTER — Encounter: Payer: Medicare Other | Admitting: Rehabilitation

## 2012-12-27 DIAGNOSIS — N1330 Unspecified hydronephrosis: Secondary | ICD-10-CM | POA: Diagnosis not present

## 2012-12-27 DIAGNOSIS — R350 Frequency of micturition: Secondary | ICD-10-CM | POA: Diagnosis not present

## 2012-12-31 ENCOUNTER — Ambulatory Visit: Payer: Medicare Other | Attending: Internal Medicine | Admitting: Rehabilitation

## 2012-12-31 DIAGNOSIS — M25569 Pain in unspecified knee: Secondary | ICD-10-CM | POA: Insufficient documentation

## 2012-12-31 DIAGNOSIS — M545 Low back pain, unspecified: Secondary | ICD-10-CM | POA: Insufficient documentation

## 2012-12-31 DIAGNOSIS — IMO0001 Reserved for inherently not codable concepts without codable children: Secondary | ICD-10-CM | POA: Insufficient documentation

## 2013-01-07 ENCOUNTER — Other Ambulatory Visit: Payer: Self-pay | Admitting: Cardiology

## 2013-01-07 ENCOUNTER — Ambulatory Visit: Payer: Medicare Other | Admitting: Rehabilitation

## 2013-01-08 ENCOUNTER — Other Ambulatory Visit: Payer: Self-pay | Admitting: *Deleted

## 2013-01-08 MED ORDER — DOFETILIDE 125 MCG PO CAPS: ORAL_CAPSULE | ORAL | Status: DC

## 2013-01-11 DIAGNOSIS — N3946 Mixed incontinence: Secondary | ICD-10-CM | POA: Diagnosis not present

## 2013-01-11 DIAGNOSIS — N1330 Unspecified hydronephrosis: Secondary | ICD-10-CM | POA: Diagnosis not present

## 2013-01-14 ENCOUNTER — Ambulatory Visit: Payer: Medicare Other | Admitting: Rehabilitation

## 2013-01-14 DIAGNOSIS — M25569 Pain in unspecified knee: Secondary | ICD-10-CM | POA: Diagnosis not present

## 2013-01-14 DIAGNOSIS — M545 Low back pain: Secondary | ICD-10-CM | POA: Diagnosis not present

## 2013-01-14 DIAGNOSIS — IMO0001 Reserved for inherently not codable concepts without codable children: Secondary | ICD-10-CM | POA: Diagnosis not present

## 2013-01-14 IMAGING — CR DG LUMBAR SPINE COMPLETE 4+V
5 series · 5 of 5 positions shown · non-contrast
Comparison: CT abdomen and pelvis 03/10/2007.

CLINICAL DATA: Acute onset right leg pain after bending injury.

LUMBAR SPINE - COMPLETE 4+ VIEW

[t l-spine a.p. *]
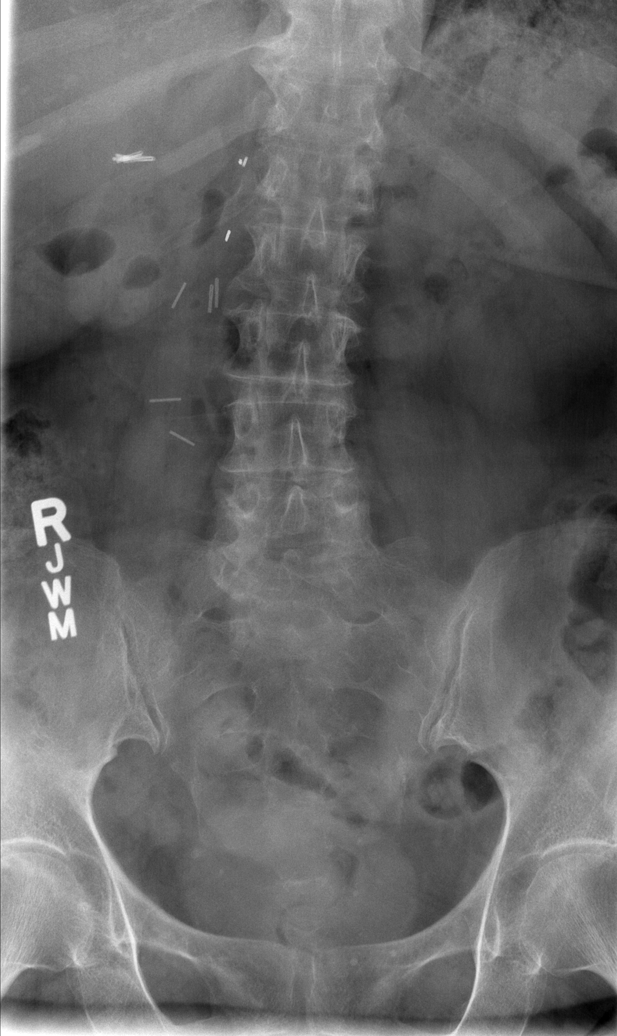

[t l-spine oblique exposure * (1 of 2)]
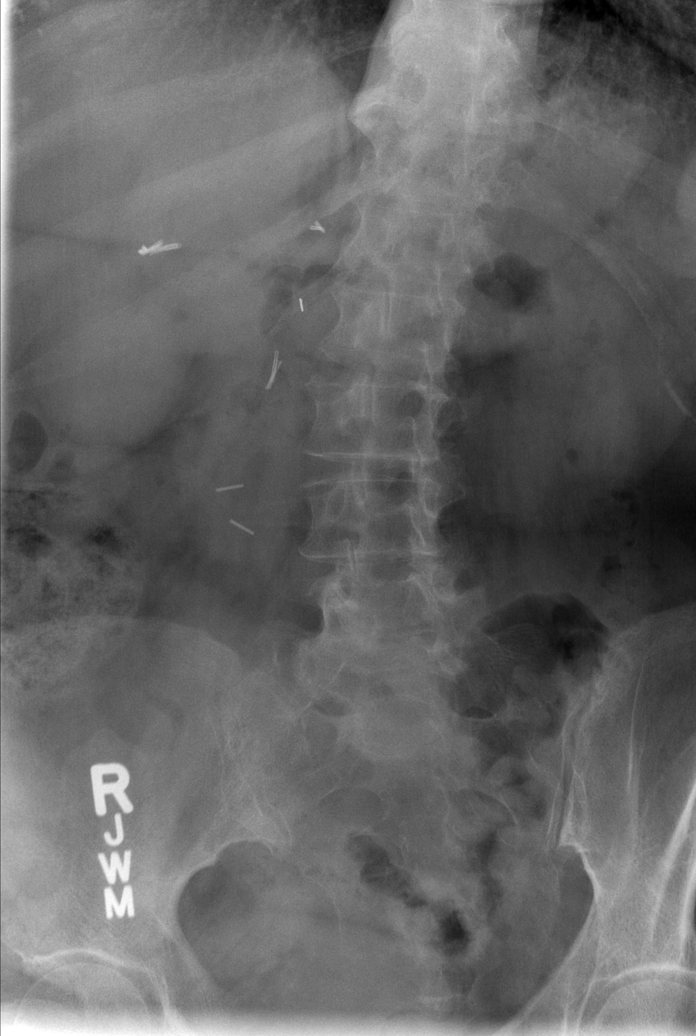

[t l-spine oblique exposure * (2 of 2)]
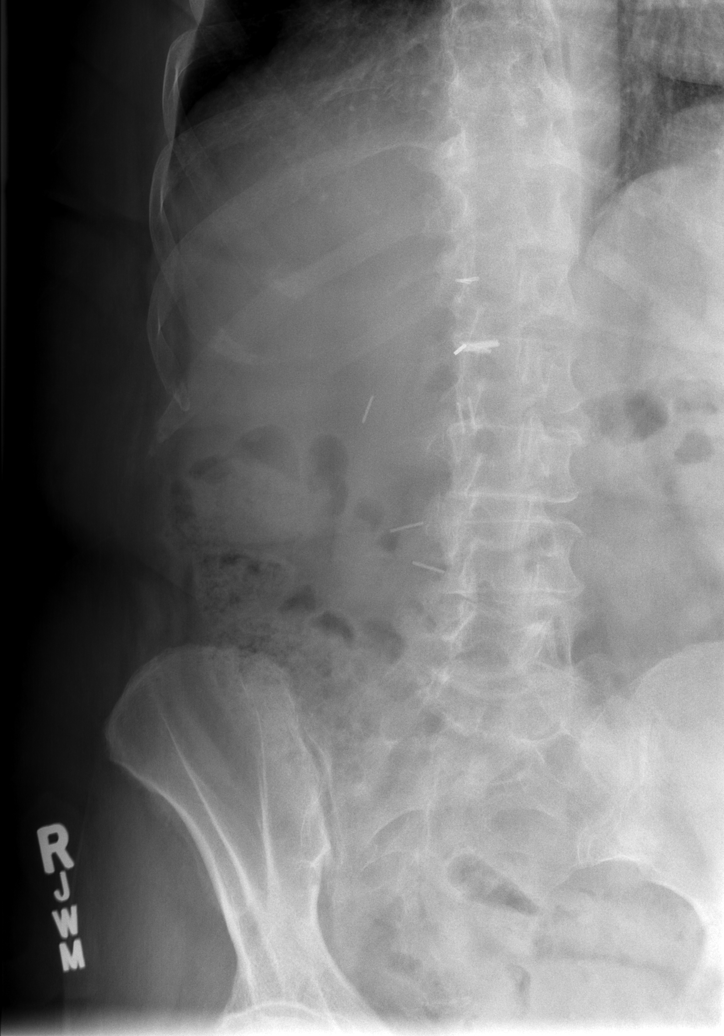

[t l-spine lat]
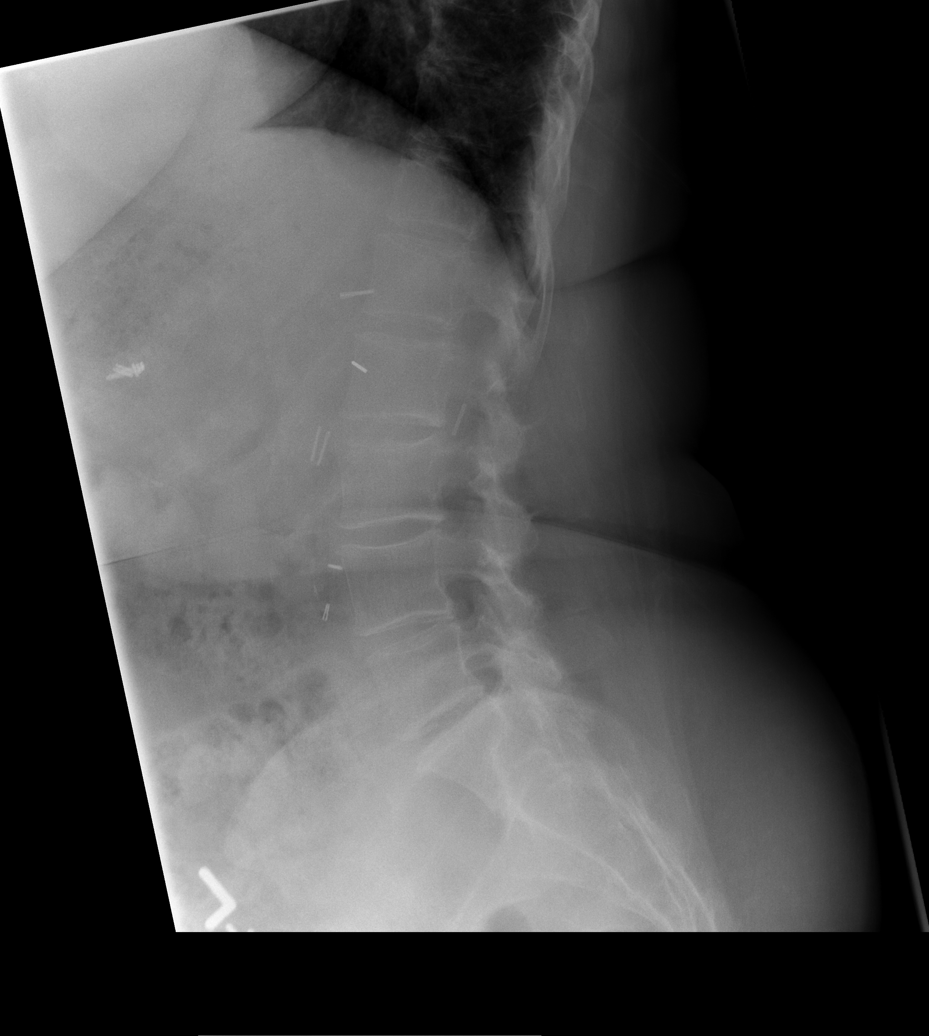

[t l-spine l5-s1 spot]
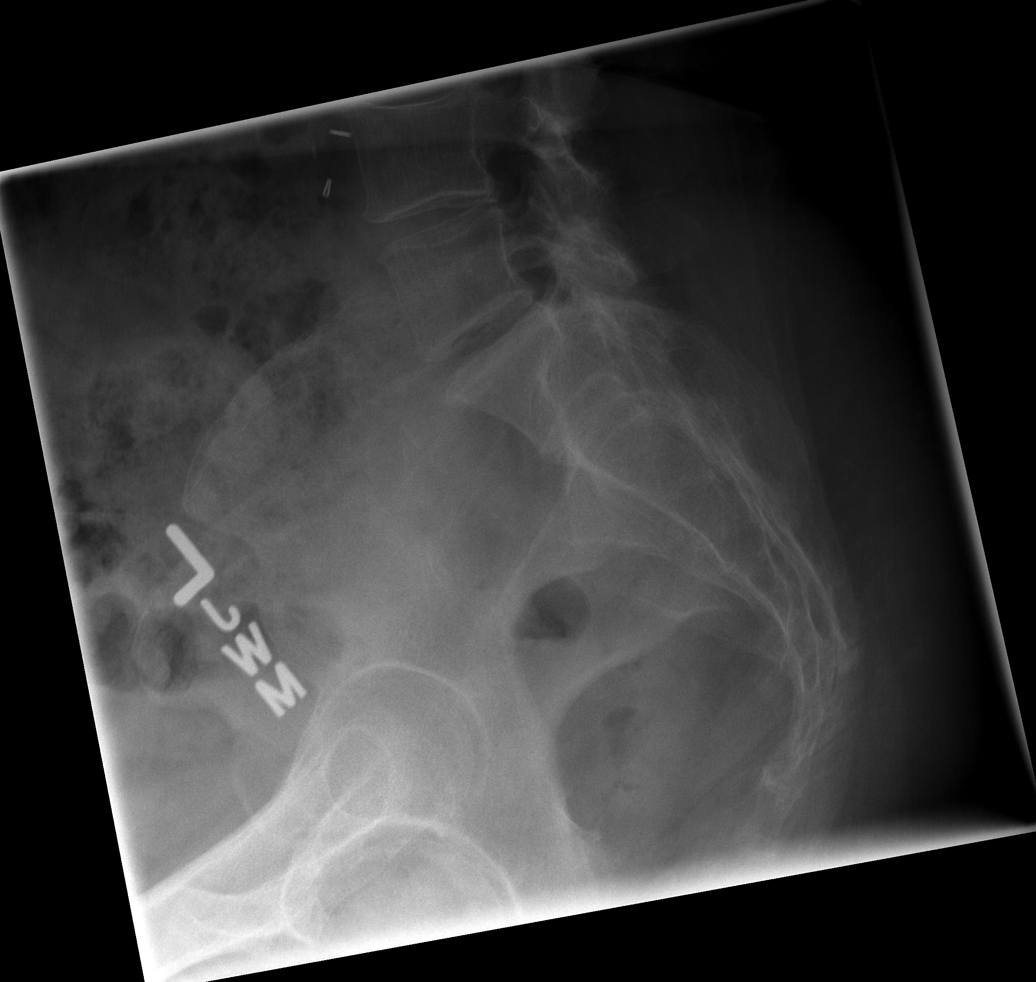

[5 of 5 positions shown; findings below may reference images not displayed]

FINDINGS: Vertebral body height and alignment are maintained.
There is some loss of disc space height at L4-5 and L5-S1.  Lower
lumbar facet degenerative change noted.
IMPRESSION: No acute finding.  Lower lumbar degenerative change.

## 2013-01-15 ENCOUNTER — Other Ambulatory Visit: Payer: Self-pay | Admitting: Cardiology

## 2013-01-21 ENCOUNTER — Ambulatory Visit: Payer: Medicare Other | Admitting: Rehabilitation

## 2013-01-21 DIAGNOSIS — M545 Low back pain: Secondary | ICD-10-CM | POA: Diagnosis not present

## 2013-01-21 DIAGNOSIS — M25569 Pain in unspecified knee: Secondary | ICD-10-CM | POA: Diagnosis not present

## 2013-01-21 DIAGNOSIS — IMO0001 Reserved for inherently not codable concepts without codable children: Secondary | ICD-10-CM | POA: Diagnosis not present

## 2013-02-11 ENCOUNTER — Ambulatory Visit: Payer: Medicare Other | Admitting: Family Medicine

## 2013-02-15 ENCOUNTER — Telehealth: Payer: Self-pay | Admitting: Cardiology

## 2013-02-15 NOTE — Telephone Encounter (Signed)
Received request from Nurse, documents faxed for surgical clearance. rmf 02/14/13 To: Dr. Wyline Copas & Perlie Gold Fax number: [phone]

## 2013-02-28 ENCOUNTER — Ambulatory Visit (INDEPENDENT_AMBULATORY_CARE_PROVIDER_SITE_OTHER): Payer: Medicare Other | Admitting: Family Medicine

## 2013-02-28 ENCOUNTER — Encounter: Payer: Self-pay | Admitting: Family Medicine

## 2013-02-28 VITALS — BP 132/82 | HR 67 | Temp 98.4°F | Ht 62.5 in | Wt 241.0 lb

## 2013-02-28 DIAGNOSIS — N259 Disorder resulting from impaired renal tubular function, unspecified: Secondary | ICD-10-CM

## 2013-02-28 DIAGNOSIS — E785 Hyperlipidemia, unspecified: Secondary | ICD-10-CM | POA: Diagnosis not present

## 2013-02-28 DIAGNOSIS — I10 Essential (primary) hypertension: Secondary | ICD-10-CM | POA: Diagnosis not present

## 2013-02-28 DIAGNOSIS — I4891 Unspecified atrial fibrillation: Secondary | ICD-10-CM

## 2013-02-28 LAB — RENAL FUNCTION PANEL
BUN: 16 mg/dL (ref 6–23)
CO2: 26 mEq/L (ref 19–32)
Chloride: 104 mEq/L (ref 96–112)
Creat: 1.12 mg/dL — ABNORMAL HIGH (ref 0.50–1.10)
Glucose, Bld: 87 mg/dL (ref 70–99)

## 2013-02-28 LAB — CBC
HCT: 40.2 % (ref 36.0–46.0)
MCV: 89.9 fL (ref 78.0–100.0)
RBC: 4.47 MIL/uL (ref 3.87–5.11)
WBC: 9.3 10*3/uL (ref 4.0–10.5)

## 2013-02-28 LAB — HEPATIC FUNCTION PANEL
ALT: 15 U/L (ref 0–35)
Alkaline Phosphatase: 87 U/L (ref 39–117)
Indirect Bilirubin: 0.3 mg/dL (ref 0.0–0.9)
Total Protein: 7.3 g/dL (ref 6.0–8.3)

## 2013-02-28 LAB — LIPID PANEL
HDL: 38 mg/dL — ABNORMAL LOW (ref >39)
LDL Cholesterol: 113 mg/dL — ABNORMAL HIGH (ref 0–99)

## 2013-02-28 NOTE — Patient Instructions (Addendum)
Consider a krill oil cap daily such as MegaRed   Cholesterol Cholesterol is a white, waxy, fat-like protein needed by your body in small amounts. The liver makes all the cholesterol you need. It is carried from the liver by the blood through the blood vessels. Deposits (plaque) may build up on blood vessel walls. This makes the arteries narrower and stiffer. Plaque increases the risk for heart attack and stroke. You cannot feel your cholesterol level even if it is very high. The only way to know is by a blood test to check your lipid (fats) levels. Once you know your cholesterol levels, you should keep a record of the test results. Work with your caregiver to to keep your levels in the desired range. WHAT THE RESULTS MEAN:  Total cholesterol is a rough measure of all the cholesterol in your blood.  LDL is the so-called bad cholesterol. This is the type that deposits cholesterol in the walls of the arteries. You want this level to be low.  HDL is the good cholesterol because it cleans the arteries and carries the LDL away. You want this level to be high.  Triglycerides are fat that the body can either burn for energy or store. High levels are closely linked to heart disease. DESIRED LEVELS:  Total cholesterol below 200.  LDL below 100 for people at risk, below 70 for very high risk.  HDL above 50 is good, above 60 is best.  Triglycerides below 150. HOW TO LOWER YOUR CHOLESTEROL:  Diet.  Choose fish or white meat chicken and [place], roasted or baked. Limit fatty cuts of red meat, fried foods, and processed meats, such as sausage and lunch meat.  Eat lots of fresh fruits and vegetables. Choose whole grains, beans, pasta, potatoes and cereals.  Use only small amounts of olive, corn or canola oils. Avoid butter, mayonnaise, shortening or palm kernel oils. Avoid foods with trans-fats.  Use skim/nonfat milk and low-fat/nonfat yogurt and cheeses. Avoid whole milk, cream, ice cream, egg  yolks and cheeses. Healthy desserts include angel food cake, ginger snaps, animal crackers, hard candy, popsicles, and low-fat/nonfat frozen yogurt. Avoid pastries, cakes, pies and cookies.  Exercise.  A regular program helps decrease LDL and raises HDL.  Helps with weight control.  Do things that increase your activity level like gardening, walking, or taking the stairs.  Medication.  May be prescribed by your caregiver to help lowering cholesterol and the risk for heart disease.  You may need medicine even if your levels are normal if you have several risk factors. HOME CARE INSTRUCTIONS   Follow your diet and exercise programs as suggested by your caregiver.  Take medications as directed.  Have blood work done when your caregiver feels it is necessary. MAKE SURE YOU:   Understand these instructions.  Will watch your condition.  Will get help right away if you are not doing well or get worse. Document Released: 07/05/2001 Document Revised: 01/02/2012 Document Reviewed: 12/26/2007 [facility] Patient Information 2013 [place], [place].

## 2013-03-03 ENCOUNTER — Encounter: Payer: Self-pay | Admitting: Family Medicine

## 2013-03-03 NOTE — Assessment & Plan Note (Signed)
Well controlled, no changes 

## 2013-03-03 NOTE — Assessment & Plan Note (Signed)
Encouraged avoid trans fats, minimize simple carbs and saturated fats. Start krill oil caps

## 2013-03-03 NOTE — Progress Notes (Signed)
Patient ID: [name], female   DOB: [birth year], 67 y.o.   MRN: [id number] [name] [id number] 1946/07/13 03/03/2013      Progress Note-Follow Up  Subjective  Chief Complaint  Chief Complaint  Patient presents with  . Follow-up    2 month    HPI  Patient is a 67 year old female in today for followup. Feeling well. She's had no recent Crohn's flares. Bowels are generally moving normally. Is recently been seen by urology and no changes were made. No recent fevers or chills. No chest pain or palpitations. For shortness of breath or GU complaints he is taking her medications as prescribed.  Past Medical History  Diagnosis Date  . CAD (coronary artery disease)   . Depression   . GERD (gastroesophageal reflux disease)   . Hyperlipidemia   . Hypertension   . Crohn's colitis     she reports ulcerative colitis beginning in her 55's, biopsies 2008 suggest Crohn's not UC  . Pseudoaneurysm of right femoral artery   . Pancreatitis   . Chronic sinusitis   . Renal oncocytoma     s/p right nephrectomy  . Myocardial infarction   . Uveitis   . Anxiety   . Obesity   . Chronic cough   . OSA (obstructive sleep apnea)   . Paroxysmal atrial fibrillation   . Arthritis   . IBS (irritable bowel syndrome)   . Blood transfusion without reported diagnosis 1976  . Sleep apnea   . Keloid of skin     Past Surgical History  Procedure Laterality Date  . Nephrectomy  10/2006    right secondary to oncocytoma   . Breast biopsy  1996    benign lesion   . Cholecystectomy  10/2006  . Tubal ligation    . Right renal artery repair  1976  . Right femoral  pseudoaneurysm & right groin hematoma evacuation  02/2007  . Colonoscopy w/ biopsies    . Esophagogastroduodenoscopy    . Coronary angioplasty      Family History  Problem Relation Age of Onset  . Colitis Father   . Hypertension Mother   . Coronary artery disease Mother   . Stroke Mother   . Stroke Brother   . Colon cancer  Paternal Aunt   . Clotting disorder Mother   . Clotting disorder Brother   . Clotting disorder Sister   . Heart disease Mother   . Heart disease Father   . Asthma Daughter   . Allergies Mother   . Asthma Maternal Uncle     History   Social History  . Marital Status: Married    Spouse Name: N/A    Number of Children: 3  . Years of Education: N/A   Occupational History  .      retired   Social History Main Topics  . Smoking status: Never Smoker   . Smokeless tobacco: Never Used  . Alcohol Use: No  . Drug Use: No  . Sexually Active: Not Currently   Other Topics Concern  . 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 amLODipine (NORVASC) 10 MG tablet TAKE 1 TABLET (10 MG TOTAL) BY MOUTH DAILY.  30 tablet  11  . ammonium lactate (  LAC-HYDRIN) 12 % lotion Apply 1 application topically daily as needed. For dry skin      . escitalopram (LEXAPRO) 10 MG tablet Take 1 tablet (10 mg total) by mouth daily.  30 tablet  5  . esomeprazole (NEXIUM) 40 MG capsule Take 1 capsule (40 mg total) by mouth 2 (two) times daily.  60 capsule  12  . Glucosamine-Chondroitin (GLUCOSAMINE CHONDR COMPLEX PO) Take 1 tablet by mouth at bedtime. Hold while in hospital      . loperamide (IMODIUM A-D) 2 MG tablet Take 1 mg by mouth daily as needed. Dihrrhea      . losartan (COZAAR) 100 MG tablet TAKE 1 TABLET (100 MG TOTAL) BY MOUTH DAILY.  30 tablet  11  . mesalamine (APRISO) 0.375 G 24 hr capsule Take 4 capsules (1.5 g total) by mouth daily.  120 capsule  11  . metoprolol tartrate (LOPRESSOR) 25 MG tablet TAKE 1/2 TABLET TWICE DAILY  30 tablet  12  . Multiple Vitamin (MULTIVITAMIN) capsule Take 1 capsule by mouth daily. 1 daily        . nitroGLYCERIN (NITROSTAT) 0.4 MG SL tablet  Place 0.4 mg under the tongue every 5 (five) minutes as needed. 1 tab under tongue every 5 min for chest pain not to exceed 3 tabs in 15 min       . Probiotic Product (PHILLIPS COLON HEALTH) CAPS Take 1 capsule by mouth daily. 1 per day      . TIKOSYN 125 MCG capsule TAKE 3 CAPSULES (375 MCG TOTAL) BY MOUTH 2 (TWO) TIMES DAILY.  180 capsule  5  . XARELTO 20 MG TABS TAKE 1 TABLET BY MOUTH DAILY  30 tablet  4   No current facility-administered medications on file prior to visit.    Allergies  Allergen Reactions  . Cefdinir     Pt cannot take; may interfere with AFib.  . Codeine     Heart beats fast   . Guaifenesin Er Nausea And Vomiting and Other (See Comments)    Headaches; can tolerate liquid.  . Latex     Breathing problems  . Mucilgen (Psyllium)   . Percocet (Oxycodone-Acetaminophen)     Itching & swelling in jaw    Review of Systems  Review of Systems  Constitutional: Negative for fever and malaise/fatigue.  HENT: Negative for congestion.   Eyes: Negative for discharge.  Respiratory: Negative for shortness of breath.   Cardiovascular: Negative for chest pain, palpitations and leg swelling.  Gastrointestinal: Negative for nausea, abdominal pain and diarrhea.  Genitourinary: Negative for dysuria.  Musculoskeletal: Negative for falls.  Skin: Negative for rash.  Neurological: Negative for loss of consciousness and headaches.  Endo/Heme/Allergies: Negative for polydipsia.  Psychiatric/Behavioral: Negative for depression and suicidal ideas. The patient is not nervous/anxious and does not have insomnia.     Objective  BP 132/82  Pulse 67  Temp(Src) 98.4 F (36.9 C) (Oral)  Ht 5' 2.5" (1.588 m)  Wt 241 lb (109.317 kg)  BMI 43.35 kg/m2  SpO2 91%  Physical Exam  Physical Exam  Constitutional: She is oriented to person, place, and time and well-developed, well-nourished, and in no distress. No distress.  HENT:  Head: Normocephalic and atraumatic.  Eyes: Conjunctivae  are normal.  Neck: Neck supple. No thyromegaly present.  Cardiovascular: Normal rate and normal heart sounds.   Pulmonary/Chest: Effort normal and breath sounds normal. She has no wheezes.  Abdominal: She exhibits no distension and no mass.  Musculoskeletal: She exhibits no  edema.  Lymphadenopathy:    She has no cervical adenopathy.  Neurological: She is alert and oriented to person, place, and time.  Skin: Skin is warm and dry. No rash noted. She is not diaphoretic.  Psychiatric: Memory, affect and judgment normal.    Lab Results  Component Value Date   TSH 3.075 02/28/2013   Lab Results  Component Value Date   WBC 9.3 02/28/2013   HGB 13.3 02/28/2013   HCT 40.2 02/28/2013   MCV 89.9 02/28/2013   PLT 271 02/28/2013   Lab Results  Component Value Date   CREATININE 1.12* 02/28/2013   BUN 16 02/28/2013   NA 140 02/28/2013   K 4.1 02/28/2013   CL 104 02/28/2013   CO2 26 02/28/2013   Lab Results  Component Value Date   ALT 15 02/28/2013   AST 16 02/28/2013   ALKPHOS 87 02/28/2013   BILITOT 0.4 02/28/2013   Lab Results  Component Value Date   CHOL 182 02/28/2013   Lab Results  Component Value Date   HDL 38* 02/28/2013   Lab Results  Component Value Date   LDLCALC 113* 02/28/2013   Lab Results  Component Value Date   TRIG 154* 02/28/2013   Lab Results  Component Value Date   CHOLHDL 4.8 02/28/2013     Assessment & Plan  HYPERTENSION Well controlled, no changes  ATRIAL FIBRILLATION Tolerating Xarelto, rate controlled  RENAL INSUFFICIENCY Mild, will continue to monitor. May need referral to nephrology if worsens  HYPERLIPIDEMIA Encouraged avoid trans fats, minimize simple carbs and saturated fats. Start krill oil caps

## 2013-03-03 NOTE — Assessment & Plan Note (Signed)
Mild, will continue to monitor. May need referral to nephrology if worsens

## 2013-03-03 NOTE — Assessment & Plan Note (Signed)
Tolerating Xarelto, rate controlled 

## 2013-03-05 DIAGNOSIS — L905 Scar conditions and fibrosis of skin: Secondary | ICD-10-CM | POA: Diagnosis not present

## 2013-03-05 DIAGNOSIS — D485 Neoplasm of uncertain behavior of skin: Secondary | ICD-10-CM | POA: Diagnosis not present

## 2013-03-05 DIAGNOSIS — Z85828 Personal history of other malignant neoplasm of skin: Secondary | ICD-10-CM | POA: Diagnosis not present

## 2013-03-19 ENCOUNTER — Encounter: Payer: Self-pay | Admitting: Cardiology

## 2013-03-19 ENCOUNTER — Ambulatory Visit (INDEPENDENT_AMBULATORY_CARE_PROVIDER_SITE_OTHER): Payer: Medicare Other | Admitting: Cardiology

## 2013-03-19 VITALS — BP 128/80 | HR 99 | Wt 238.0 lb

## 2013-03-19 DIAGNOSIS — I2510 Atherosclerotic heart disease of native coronary artery without angina pectoris: Secondary | ICD-10-CM | POA: Diagnosis not present

## 2013-03-19 DIAGNOSIS — E785 Hyperlipidemia, unspecified: Secondary | ICD-10-CM | POA: Diagnosis not present

## 2013-03-19 DIAGNOSIS — I4891 Unspecified atrial fibrillation: Secondary | ICD-10-CM | POA: Diagnosis not present

## 2013-03-19 DIAGNOSIS — I10 Essential (primary) hypertension: Secondary | ICD-10-CM | POA: Diagnosis not present

## 2013-03-19 LAB — MAGNESIUM: Magnesium: 2.1 mg/dL (ref 1.5–2.5)

## 2013-03-19 MED ORDER — METOPROLOL TARTRATE 25 MG PO TABS: 25.0000 mg | ORAL_TABLET | Freq: Two times a day (BID) | ORAL | Status: DC

## 2013-03-19 NOTE — Assessment & Plan Note (Signed)
Continue diet. Intolerant to statins. 

## 2013-03-19 NOTE — Progress Notes (Signed)
HPI: Pleasant female with past medical history of coronary artery disease and atrial fibrillation returns for followup. Last cardiac catheterization on Mar 06, 2007 showed an ejection fraction of 55%. There is nonobstructive plaque in the LAD, and circumflex and the right coronary artery was normal. 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 Echocardiogram in March of 2010 showed normal LV function, moderate biatrial enlargement and moderate tricuspid regurgitation. She has had a previous TEE guided cardioversion on Feb 27, 2009. Also note the patient is extremely sensitive to Coumadin. Previous TSH is normal. Carotid Dopplers in February of 2011 showed 0-39% stenosis bilaterally. MRA of the abdomen in September of 2012 showed no left renal artery stenosis and previous right nephrectomy. 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 I last saw her in August of 2013. Since then, she has dyspnea with more extreme activities. No orthopnea, PND, pedal edema, chest pain or syncope. No bleeding. When she awoke this morning she noted that she was in recurrent atrial fibrillation for the first time in quite a while. She feels mild palpitations but is otherwise asymptomatic.   Current Outpatient Prescriptions  Medication Sig Dispense Refill  . amLODipine (NORVASC) 10 MG tablet TAKE 1 TABLET (10 MG TOTAL) BY MOUTH DAILY.  30 tablet  11  . ammonium lactate (LAC-HYDRIN) 12 % lotion Apply 1 application topically daily as needed. For dry skin      . escitalopram (LEXAPRO) 10 MG tablet Take 1 tablet (10 mg total) by mouth daily.  30 tablet  5  . esomeprazole (NEXIUM) 40 MG capsule Take 1 capsule (40 mg total) by mouth 2 (two) times daily.  60 capsule  12  . Glucosamine-Chondroitin (GLUCOSAMINE CHONDR COMPLEX PO) Take 1 tablet by mouth at bedtime. Hold while in hospital      . loperamide (IMODIUM A-D) 2 MG  tablet Take 1 mg by mouth daily as needed. Dihrrhea      . losartan (COZAAR) 100 MG tablet TAKE 1 TABLET (100 MG TOTAL) BY MOUTH DAILY.  30 tablet  11  . mesalamine (APRISO) 0.375 G 24 hr capsule Take 4 capsules (1.5 g total) by mouth daily.  120 capsule  11  . metoprolol tartrate (LOPRESSOR) 25 MG tablet TAKE 1/2 TABLET TWICE DAILY  30 tablet  12  . Multiple Vitamin (MULTIVITAMIN) capsule Take 1 capsule by mouth daily. 1 daily        . nitroGLYCERIN (NITROSTAT) 0.4 MG SL tablet Place 0.4 mg under the tongue every 5 (five) minutes as needed. 1 tab under tongue every 5 min for chest pain not to exceed 3 tabs in 15 min       . Probiotic Product (PHILLIPS COLON HEALTH) CAPS Take 1 capsule by mouth daily. 1 per day      . TIKOSYN 125 MCG capsule TAKE 3 CAPSULES (375 MCG TOTAL) BY MOUTH 2 (TWO) TIMES DAILY.  180 capsule  5  . XARELTO 20 MG TABS TAKE 1 TABLET BY MOUTH DAILY  30 tablet  4   No current facility-administered medications for this visit.     Past Medical History  Diagnosis Date  . CAD (coronary artery disease)   . Depression   . GERD (gastroesophageal reflux disease)   . Hyperlipidemia   . Hypertension   . Crohn's colitis     she reports ulcerative colitis beginning in her 72's, biopsies 2008 suggest  Crohn's not UC  . Pseudoaneurysm of right femoral artery   . Pancreatitis   . Chronic sinusitis   . Renal oncocytoma     s/p right nephrectomy  . Myocardial infarction   . Uveitis   . Anxiety   . Obesity   . Chronic cough   . OSA (obstructive sleep apnea)   . Paroxysmal atrial fibrillation   . Arthritis   . IBS (irritable bowel syndrome)   . Blood transfusion without reported diagnosis 1976  . Sleep apnea   . Keloid of skin     Past Surgical History  Procedure Laterality Date  . Nephrectomy  10/2006    right secondary to oncocytoma   . Breast biopsy  1996    benign lesion   . Cholecystectomy  10/2006  . Tubal ligation    . Right renal artery repair  1976  . Right  femoral  pseudoaneurysm & right groin hematoma evacuation  02/2007  . Colonoscopy w/ biopsies    . Esophagogastroduodenoscopy    . Coronary angioplasty      History   Social History  . Marital Status: Married    Spouse Name: N/A    Number of Children: 3  . Years of Education: N/A   Occupational History  .      retired   Social History Main Topics  . Smoking status: Never Smoker   . Smokeless tobacco: Never Used  . Alcohol Use: No  . Drug Use: No  . Sexually Active: Not Currently   Other Topics Concern  . 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 Remaining systems are negative.  Physical Exam: Well-developed well-nourished in no acute distress.  Skin is warm and dry.  HEENT is normal.  Neck is supple.  Chest is clear to auscultation with normal expansion.  Cardiovascular exam is irregular Abdominal exam nontender or distended. No masses palpated. Extremities show no edema. neuro grossly intact  ECG Atrial fibrillation

## 2013-03-19 NOTE — Patient Instructions (Addendum)
Your physician recommends that you schedule a follow-up appointment in: 6 WEEKS WITH DR CRENSHAW  INCREASE METOPROLOL TO 25 MG TWICE DAILY  Your physician has requested that you have an echocardiogram. Echocardiography is a painless test that uses sound waves to create images of your heart. It provides your doctor with information about the size and shape of your heart and how well your heart's chambers and valves are working. This procedure takes approximately one hour. There are no restrictions for this procedure.   Your physician recommends that you HAVE LAB WORK TODAY

## 2013-03-19 NOTE — Assessment & Plan Note (Signed)
Continue present blood pressure medications. 

## 2013-03-19 NOTE — Assessment & Plan Note (Addendum)
Intolerant to statins. Not on aspirin given need for anticoagulation.

## 2013-03-19 NOTE — Assessment & Plan Note (Addendum)
Patient has developed recurrent atrial fibrillation. Her symptoms began this morning. She has been on xeralto. I will continue Toprol and increase to 25 mg by mouth twice a day. Continue tikosyn. Recent potassium normal. Check magnesium. Repeat echocardiogram. She will contact us tomorrow. Hopefully she will convert to sinus rhythm on her own. If not we will proceed with cardioversion on Thursday or Friday. Hopefully her episodes of atrial fibrillation will continue to be in frequent. Otherwise we could consider rate control and anticoagulation assuming she remains asymptomatic. Renal function and Hgb recently checked by primary care.

## 2013-03-20 ENCOUNTER — Telehealth: Payer: Self-pay | Admitting: Cardiology

## 2013-03-20 ENCOUNTER — Other Ambulatory Visit: Payer: Self-pay | Admitting: Cardiology

## 2013-03-20 DIAGNOSIS — I4891 Unspecified atrial fibrillation: Secondary | ICD-10-CM

## 2013-03-20 NOTE — Telephone Encounter (Signed)
New problem   Pt came in office yesterday and was told to call back today if her Afib haven't gone back into rhythm. Please call pt she is still having problems

## 2013-03-20 NOTE — Telephone Encounter (Signed)
Spoke with pt, she will have DCCV 03-21-13 @ 2pm with dr Myrtis Ser. She will have clear liquids but nothing after 7am. She will take her meds but will hold her metoprolol tomorrow morning.

## 2013-03-21 ENCOUNTER — Encounter (HOSPITAL_COMMUNITY): Payer: Self-pay

## 2013-03-21 ENCOUNTER — Ambulatory Visit (HOSPITAL_COMMUNITY): Admit: 2013-03-21 | Payer: Medicare Other | Admitting: Cardiology

## 2013-03-21 SURGERY — CARDIOVERSION
Anesthesia: Monitor Anesthesia Care

## 2013-03-25 ENCOUNTER — Telehealth: Payer: Self-pay | Admitting: Cardiology

## 2013-03-25 NOTE — Telephone Encounter (Signed)
Spoke with pt, she was cardioverted last week. She questions if cont on the increased dose of metoprolol or should she go back to the lower dosage. She reports her pulse in the 60's. Will discuss with dr Jens Som

## 2013-03-25 NOTE — Telephone Encounter (Signed)
New Problem:    Patient called in wanting to speak with you because her mother's funeral is on 03/27/13 and she would like to reschedule her ECHO on that same day and she had a question.  Please call back.

## 2013-03-25 NOTE — Telephone Encounter (Signed)
Discussed with dr Jens Som, pt aware to cont on metoprolol 25 mg twice daily.

## 2013-03-27 ENCOUNTER — Other Ambulatory Visit (HOSPITAL_COMMUNITY): Payer: Medicare Other

## 2013-04-02 ENCOUNTER — Ambulatory Visit (HOSPITAL_COMMUNITY): Payer: Medicare Other | Attending: Cardiovascular Disease

## 2013-04-02 ENCOUNTER — Encounter: Payer: Self-pay | Admitting: Pulmonary Disease

## 2013-04-02 ENCOUNTER — Ambulatory Visit (INDEPENDENT_AMBULATORY_CARE_PROVIDER_SITE_OTHER): Payer: Medicare Other | Admitting: Pulmonary Disease

## 2013-04-02 VITALS — BP 132/80 | HR 51 | Temp 98.5°F | Ht 63.0 in | Wt 243.6 lb

## 2013-04-02 DIAGNOSIS — I10 Essential (primary) hypertension: Secondary | ICD-10-CM | POA: Diagnosis not present

## 2013-04-02 DIAGNOSIS — I4891 Unspecified atrial fibrillation: Secondary | ICD-10-CM | POA: Insufficient documentation

## 2013-04-02 DIAGNOSIS — I252 Old myocardial infarction: Secondary | ICD-10-CM | POA: Diagnosis not present

## 2013-04-02 DIAGNOSIS — I2510 Atherosclerotic heart disease of native coronary artery without angina pectoris: Secondary | ICD-10-CM | POA: Insufficient documentation

## 2013-04-02 DIAGNOSIS — G4733 Obstructive sleep apnea (adult) (pediatric): Secondary | ICD-10-CM

## 2013-04-02 DIAGNOSIS — E785 Hyperlipidemia, unspecified: Secondary | ICD-10-CM | POA: Diagnosis not present

## 2013-04-02 NOTE — Progress Notes (Signed)
Echocardiogram performed.  

## 2013-04-02 NOTE — Patient Instructions (Addendum)
Will get you a new machine, and set on your current pressure.  Please let us know if you are not sleeping as well, and we can make adjustments to your pressure if needed.  Keep up with mask changes and supplies. Work on weight loss followup with me in one year if doing well.

## 2013-04-02 NOTE — Assessment & Plan Note (Signed)
The patient has a history of severe objective sleep apnea, and has been very compliant with CPAP despite not being seen in over 2 years.  She has been keeping up with the mask changes and supplies, but her machine recently stopped working.  I will order a new machine for her, and have also encouraged her to work aggressively on weight loss.  She is to call me if she is not sleeping as well, and we can optimize her pressure again on the automatic setting.

## 2013-04-02 NOTE — Progress Notes (Signed)
  Subjective:    Patient ID: [name], female    DOB: [birth year], 67 y.o.   MRN: [id number]  HPI The patient comes in today for followup of her obstructive sleep apnea.  I have not seen her in over 2 years, but she has been wearing CPAP compliantly.  She feels that she has been sleeping well with the device, and has had no mask issues.  Her machine recently began making noises, and then quit working.  She is needing a new CPAP machine.  The patient has been keeping up with her mask changes and supplies, and denies any significant sleepiness during the day.  Her weight is up about 30 pounds from the last visit here in 2012.   Review of Systems  Constitutional: Negative for fever and unexpected weight change.  HENT: Positive for sneezing and dental problem. Negative for ear pain, nosebleeds, congestion, sore throat, rhinorrhea, trouble swallowing, postnasal drip and sinus pressure.   Eyes: Negative for redness and itching.  Respiratory: Positive for cough. Negative for chest tightness, shortness of breath and wheezing.   Cardiovascular: Positive for palpitations. Negative for leg swelling.  Gastrointestinal: Negative for nausea and vomiting.       Indigestion  Genitourinary: Negative for dysuria.  Musculoskeletal: Negative for joint swelling.  Skin: Negative for rash.  Neurological: Negative for headaches.  Hematological: Does not bruise/bleed easily.  Psychiatric/Behavioral: Negative for dysphoric mood. The patient is not nervous/anxious.        Objective:   Physical Exam Obese female in no acute distress Nose without purulence or discharge noted Oropharynx clear No skin breakdown or pressure necrosis from the CPAP mask Chest totally clear to auscultation, no wheezing Cardiac exam with regular rate and rhythm, 2/6 systolic murmur Lower extremities with mild edema, no cyanosis Alert, does not appear to be sleepy, moves all 4 extremities.       Assessment & Plan:

## 2013-05-01 ENCOUNTER — Encounter: Payer: Self-pay | Admitting: Cardiology

## 2013-05-10 ENCOUNTER — Other Ambulatory Visit: Payer: Self-pay | Admitting: Cardiology

## 2013-05-16 ENCOUNTER — Ambulatory Visit: Payer: Medicare Other | Admitting: Cardiology

## 2013-05-29 ENCOUNTER — Ambulatory Visit (INDEPENDENT_AMBULATORY_CARE_PROVIDER_SITE_OTHER): Payer: Medicare Other | Admitting: Physician Assistant

## 2013-05-29 ENCOUNTER — Encounter: Payer: Self-pay | Admitting: Physician Assistant

## 2013-05-29 VITALS — BP 112/78 | HR 54 | Temp 97.9°F | Resp 16 | Ht 63.0 in | Wt 244.8 lb

## 2013-05-29 DIAGNOSIS — IMO0002 Reserved for concepts with insufficient information to code with codable children: Secondary | ICD-10-CM

## 2013-05-29 DIAGNOSIS — M5410 Radiculopathy, site unspecified: Secondary | ICD-10-CM

## 2013-05-29 MED ORDER — METHYLPREDNISOLONE ACETATE 40 MG/ML IJ SUSP
40.0000 mg | Freq: Once | INTRAMUSCULAR | Status: AC
  Administered 2013-05-29: 40 mg via INTRAMUSCULAR

## 2013-05-29 MED ORDER — TRAMADOL HCL 50 MG PO TABS: 50.0000 mg | ORAL_TABLET | Freq: Three times a day (TID) | ORAL | Status: DC | PRN

## 2013-05-29 MED ORDER — METHYLPREDNISOLONE ACETATE 40 MG/ML IJ SUSP: 40.0000 mg | Freq: Once | INTRAMUSCULAR | Status: DC

## 2013-05-29 NOTE — Assessment & Plan Note (Deleted)
BP good at visit.  HR was slightly bradycardic, in the lower 50s on second pulse check.  Patient to consult with her cardiologist about dose change with her BB.

## 2013-05-29 NOTE — Assessment & Plan Note (Signed)
Recurrent.  DepoMedrol 40mg  IM injection given.  Rx for Tamadol given for pain relief.  MRI of lumbar spine.  Will call patient with results and treat patient accordingly.  Patient encouraged to restart physical therapy exercises.

## 2013-05-29 NOTE — Progress Notes (Signed)
Patient ID: [name], female   DOB: December 21, 1945, 67 y.o.   MRN: [id number]  Patient is a 67 year-old female who presents to clinic today c/o bilateral hip pain that radiates to buttocks and down leg that has been occurring over the past two weeks. Patient states that pain in hips/buttocks is constant 4/10 and she occasionally experiences the pain traveling down her L leg.  Patient has a history of radicular back pain that was diagnosed 1 year ago.  Lumbar Xray at that time showed some loss of disc space height at L4-5 and L5-S1.  Was treated with a steroid injection and physical therapy. Patient states this pain feels similar to her pain 1 year ago.  Patient has not been doing her exercises as prescribed by PT.  Pain is alleviated with sitting or lying down.  Patient denies saddle anesthesia, urinary retention or incontinence.  Has hc of Crohn's disease that is managed by GI.  Denies fever, dizziness, weight loss, excessive fatigue.  Denies N/V/C/D.  Denies recent trauma.   Past Medical History  Diagnosis Date  . CAD (coronary artery disease)   . Depression   . GERD (gastroesophageal reflux disease)   . Hyperlipidemia   . Hypertension   . Crohn's colitis     she reports ulcerative colitis beginning in her 17's, biopsies 2008 suggest Crohn's not UC  . Pseudoaneurysm of right femoral artery   . Pancreatitis   . Chronic sinusitis   . Renal oncocytoma     s/p right nephrectomy  . Myocardial infarction   . Uveitis   . Anxiety   . Obesity   . Chronic cough   . OSA (obstructive sleep apnea)   . Paroxysmal atrial fibrillation   . Arthritis   . IBS (irritable bowel syndrome)   . Blood transfusion without reported diagnosis 1976  . Sleep apnea   . Keloid of skin    Current Outpatient Prescriptions on File Prior to Visit  Medication Sig Dispense Refill  . amLODipine (NORVASC) 10 MG tablet TAKE 1 TABLET (10 MG TOTAL) BY MOUTH DAILY.  30 tablet  11  . ammonium lactate (LAC-HYDRIN) 12 %  lotion Apply 1 application topically daily as needed. For dry skin      . escitalopram (LEXAPRO) 10 MG tablet Take 1 tablet (10 mg total) by mouth daily.  30 tablet  5  . esomeprazole (NEXIUM) 40 MG capsule Take 1 capsule (40 mg total) by mouth 2 (two) times daily.  60 capsule  12  . Glucosamine-Chondroitin (GLUCOSAMINE CHONDR COMPLEX PO) Take 1 tablet by mouth at bedtime. Hold while in hospital      . loperamide (IMODIUM A-D) 2 MG tablet Take 1 mg by mouth daily as needed. Dihrrhea      . losartan (COZAAR) 100 MG tablet TAKE 1 TABLET (100 MG TOTAL) BY MOUTH DAILY.  30 tablet  11  . metoprolol tartrate (LOPRESSOR) 25 MG tablet Take 1 tablet (25 mg total) by mouth 2 (two) times daily.  60 tablet  12  . Multiple Vitamin (MULTIVITAMIN) capsule Take 1 capsule by mouth daily. 1 daily        . nitroGLYCERIN (NITROSTAT) 0.4 MG SL tablet Place 0.4 mg under the tongue every 5 (five) minutes as needed. 1 tab under tongue every 5 min for chest pain not to exceed 3 tabs in 15 min       . Probiotic Product (PHILLIPS COLON HEALTH) CAPS Take 1 capsule by mouth daily. 1 per day      .  TIKOSYN 125 MCG capsule TAKE 3 CAPSULES (375 MCG TOTAL) BY MOUTH 2 (TWO) TIMES DAILY.  180 capsule  5  . XARELTO 20 MG TABS TAKE 1 TABLET BY MOUTH DAILY  30 tablet  4   No current facility-administered medications on file prior to visit.   Allergies  Allergen Reactions  . Cefdinir     Pt cannot take; may interfere with AFib.  . Codeine     Heart beats fast   . Guaifenesin Er Nausea And Vomiting and Other (See Comments)    Headaches; can tolerate liquid.  . Latex     Breathing problems  . Mucilgen (Psyllium)   . Percocet (Oxycodone-Acetaminophen)     Itching & swelling in jaw   Family History  Problem Relation Age of Onset  . Colitis Father   . Hypertension Mother   . Coronary artery disease Mother   . Stroke Mother   . Stroke Brother   . Colon cancer Paternal Aunt   . Clotting disorder Mother   . Clotting  disorder Brother   . Clotting disorder Sister   . Heart disease Mother   . Heart disease Father   . Asthma Daughter   . Allergies Mother   . Asthma Maternal Uncle    History   Social History  . Marital Status: Married    Spouse Name: N/A    Number of Children: 3  . Years of Education: N/A   Occupational History  .      retired   Social History Main Topics  . Smoking status: Never Smoker   . Smokeless tobacco: Never Used  . Alcohol Use: No  . Drug Use: No  . Sexually Active: Not Currently   Other Topics Concern  . 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  HENT: Negative for neck pain.   Cardiovascular: Negative for chest pain and palpitations.  Gastrointestinal: Negative for nausea, vomiting, abdominal pain, diarrhea, constipation, blood in stool and melena.  Genitourinary: Negative for dysuria, urgency, frequency, hematuria and flank pain.  Musculoskeletal: Positive for back pain. Negative for myalgias and falls.  Neurological: Negative for dizziness, tingling, sensory change and loss of consciousness.   Filed Vitals:   05/29/13 1101  Height: 5\' 3"  (1.6 m)  Weight: 244 lb 12 oz (111.018 kg)    Physical Exam  Nursing note and vitals reviewed. Constitutional: She is oriented to person, place, and time.  Well developed, overweight female sitting comfortably on examination table  HENT:  Head: Normocephalic and atraumatic.  Eyes: Conjunctivae are normal. Pupils are equal, round, and reactive to light.  Neck: Normal range of motion. Neck supple.  Cardiovascular: Normal rate, regular rhythm and normal heart sounds.   Pulmonary/Chest: Effort normal and breath sounds normal. No respiratory distress. She has no wheezes. She has no rales. She  exhibits no tenderness.  Abdominal: Soft. Bowel sounds are normal. She exhibits no distension and no mass. There is no tenderness. There is no rebound and no guarding.  Musculoskeletal:  Mild tenderness with palpation over peri-sacral region.  No bony point tenderness on examination.  ROM is normal but associated with mild pain.  Neurological: She is alert and oriented to person, place, and time. No cranial nerve deficit.  Skin: Skin is  warm and dry.    Assessment/Plan: Radicular low back pain Recurrent.  DepoMedrol 40mg  IM injection given.  Rx for Tamadol given for pain relief.  MRI of lumbar spine.  Will call patient with results and treat patient accordingly.  Patient encouraged to restart physical therapy exercises.

## 2013-05-29 NOTE — Patient Instructions (Signed)
Will call you with appointment date/time for MRI of lumbar spine.  Please take tramadol as needed for pain.  If you experience rapid heart beat, please stop taking the medication and give [place] a call.  I will call you with MRI results.   Radicular Pain Radicular pain in either the arm or leg is usually from a bulging or herniated disk in the spine. A piece of the herniated disk may press against the nerves as the nerves exit the spine. This causes pain which is felt at the tips of the nerves down the arm or leg. Other causes of radicular pain may include:  Fractures.  Heart disease.  Cancer.  An abnormal and usually degenerative state of the nervous system or nerves (neuropathy). Diagnosis may require CT or MRI scanning to determine the primary cause.  Nerves that start at the neck (nerve roots) may cause radicular pain in the outer shoulder and arm. It can spread down to the thumb and fingers. The symptoms vary depending on which nerve root has been affected. In most cases radicular pain improves with conservative treatment. Neck problems may require physical therapy, a neck collar, or cervical traction. Treatment may take many weeks, and surgery may be considered if the symptoms do not improve.  Conservative treatment is also recommended for sciatica. Sciatica causes pain to radiate from the lower back or buttock area down the leg into the foot. Often there is a history of back problems. Most patients with sciatica are better after 2 to 4 weeks of rest and other supportive care. Short term bed rest can reduce the disk pressure considerably. Sitting, however, is not a good position since this increases the pressure on the disk. You should avoid bending, lifting, and all other activities which make the problem worse. Traction can be used in severe cases. Surgery is usually reserved for patients who do not improve within the first months of treatment. Only take over-the-counter or prescription medicines  for pain, discomfort, or fever as directed by your caregiver. Narcotics and muscle relaxants may help by relieving more severe pain and spasm and by providing mild sedation. Cold or massage can give significant relief. Spinal manipulation is not recommended. It can increase the degree of disc protrusion. Epidural steroid injections are often effective treatment for radicular pain. These injections deliver medicine to the spinal nerve in the space between the protective covering of the spinal cord and back bones (vertebrae). Your caregiver can give you more information about steroid injections. These injections are most effective when given within two weeks of the onset of pain.  You should see your caregiver for follow up care as recommended. A program for neck and back injury rehabilitation with stretching and strengthening exercises is an important part of management.  SEEK IMMEDIATE MEDICAL CARE IF:  You develop increased pain, weakness, or numbness in your arm or leg.  You develop difficulty with bladder or bowel control.  You develop abdominal pain. Document Released: 11/17/2004 Document Revised: 01/02/2012 Document Reviewed: 02/02/2009 [facility] Patient Information 2014 [place], [place].

## 2013-05-30 NOTE — Addendum Note (Signed)
Addended by: Regis Bill on: 05/30/2013 10:04 AM   Modules accepted: Orders

## 2013-06-01 ENCOUNTER — Ambulatory Visit (HOSPITAL_BASED_OUTPATIENT_CLINIC_OR_DEPARTMENT_OTHER)
Admission: RE | Admit: 2013-06-01 | Discharge: 2013-06-01 | Disposition: A | Discharge status: Home / Self Care | Payer: Medicare Other | Source: Ambulatory Visit | Attending: Physician Assistant | Admitting: Physician Assistant

## 2013-06-01 DIAGNOSIS — M47817 Spondylosis without myelopathy or radiculopathy, lumbosacral region: Secondary | ICD-10-CM | POA: Diagnosis not present

## 2013-06-01 DIAGNOSIS — IMO0002 Reserved for concepts with insufficient information to code with codable children: Secondary | ICD-10-CM | POA: Diagnosis not present

## 2013-06-01 DIAGNOSIS — M48061 Spinal stenosis, lumbar region without neurogenic claudication: Secondary | ICD-10-CM | POA: Diagnosis not present

## 2013-06-01 DIAGNOSIS — M51379 Other intervertebral disc degeneration, lumbosacral region without mention of lumbar back pain or lower extremity pain: Secondary | ICD-10-CM | POA: Insufficient documentation

## 2013-06-01 DIAGNOSIS — M5137 Other intervertebral disc degeneration, lumbosacral region: Secondary | ICD-10-CM | POA: Diagnosis not present

## 2013-06-01 DIAGNOSIS — M5126 Other intervertebral disc displacement, lumbar region: Secondary | ICD-10-CM | POA: Diagnosis not present

## 2013-06-01 DIAGNOSIS — M5410 Radiculopathy, site unspecified: Secondary | ICD-10-CM

## 2013-06-04 ENCOUNTER — Ambulatory Visit (INDEPENDENT_AMBULATORY_CARE_PROVIDER_SITE_OTHER): Payer: Medicare Other | Admitting: Family Medicine

## 2013-06-04 ENCOUNTER — Encounter: Payer: Self-pay | Admitting: Family Medicine

## 2013-06-04 VITALS — BP 130/84 | HR 56 | Temp 98.4°F | Ht 63.0 in | Wt 242.1 lb

## 2013-06-04 DIAGNOSIS — K50119 Crohn's disease of large intestine with unspecified complications: Secondary | ICD-10-CM

## 2013-06-04 DIAGNOSIS — G4733 Obstructive sleep apnea (adult) (pediatric): Secondary | ICD-10-CM

## 2013-06-04 DIAGNOSIS — IMO0002 Reserved for concepts with insufficient information to code with codable children: Secondary | ICD-10-CM

## 2013-06-04 DIAGNOSIS — I10 Essential (primary) hypertension: Secondary | ICD-10-CM

## 2013-06-04 DIAGNOSIS — N259 Disorder resulting from impaired renal tubular function, unspecified: Secondary | ICD-10-CM | POA: Diagnosis not present

## 2013-06-04 DIAGNOSIS — R1032 Left lower quadrant pain: Secondary | ICD-10-CM

## 2013-06-04 DIAGNOSIS — K5010 Crohn's disease of large intestine without complications: Secondary | ICD-10-CM

## 2013-06-04 DIAGNOSIS — M5410 Radiculopathy, site unspecified: Secondary | ICD-10-CM

## 2013-06-04 DIAGNOSIS — E785 Hyperlipidemia, unspecified: Secondary | ICD-10-CM

## 2013-06-04 LAB — CBC
HCT: 41.7 % (ref 36.0–46.0)
MCH: 29.7 pg (ref 26.0–34.0)
MCHC: 33.1 g/dL (ref 30.0–36.0)
MCV: 89.7 fL (ref 78.0–100.0)
Platelets: 278 10*3/uL (ref 150–400)
RDW: 13.9 % (ref 11.5–15.5)

## 2013-06-04 LAB — HEPATIC FUNCTION PANEL
Albumin: 4.1 g/dL (ref 3.5–5.2)
Alkaline Phosphatase: 92 U/L (ref 39–117)
Total Protein: 7.6 g/dL (ref 6.0–8.3)

## 2013-06-04 LAB — URINALYSIS
Leukocytes, UA: NEGATIVE
Nitrite: NEGATIVE
Specific Gravity, Urine: 1.023 (ref 1.005–1.030)
Urobilinogen, UA: 0.2 mg/dL (ref 0.0–1.0)
pH: 6 (ref 5.0–8.0)

## 2013-06-04 LAB — RENAL FUNCTION PANEL
Albumin: 4.1 g/dL (ref 3.5–5.2)
BUN: 23 mg/dL (ref 6–23)
Chloride: 102 mEq/L (ref 96–112)
Creat: 1.18 mg/dL — ABNORMAL HIGH (ref 0.50–1.10)
Phosphorus: 4.1 mg/dL (ref 2.3–4.6)

## 2013-06-04 NOTE — Progress Notes (Signed)
Patient ID: [name], female   DOB: 1945/12/31, 67 y.o.   MRN: [id number] [name] [id number] 25-Nov-1945 06/04/2013      Progress Note-Follow Up  Subjective  Chief Complaint  Chief Complaint  Patient presents with  . Follow-up    3 month    HPI  Patient is a 67 year old Caucasian female who is in today for followup. She was seen last week with low back pain and left lower quadrant pain. She reports with her steroid shot and treatment she is 80% better. She is still having some mild left lower quadrant pain and pelvic pain but denies any urinary complaints. Her bowels are moving well. No fevers or chills. No chest pain no palpitations, shortness of breath are noted. Is taking medications as prescribed metoprolol has been cut down to half a tablet twice daily due to some bradycardia and she is tolerating this well  Past Medical History  Diagnosis Date  . CAD (coronary artery disease)   . Depression   . GERD (gastroesophageal reflux disease)   . Hyperlipidemia   . Hypertension   . Crohn's colitis     she reports ulcerative colitis beginning in her 15's, biopsies 2008 suggest Crohn's not UC  . Pseudoaneurysm of right femoral artery   . Pancreatitis   . Chronic sinusitis   . Renal oncocytoma     s/p right nephrectomy  . Myocardial infarction   . Uveitis   . Anxiety   . Obesity   . Chronic cough   . OSA (obstructive sleep apnea)   . Paroxysmal atrial fibrillation   . Arthritis   . IBS (irritable bowel syndrome)   . Blood transfusion without reported diagnosis 1976  . Sleep apnea   . Keloid of skin     Past Surgical History  Procedure Laterality Date  . Nephrectomy  10/2006    right secondary to oncocytoma   . Breast biopsy  1996    benign lesion   . Cholecystectomy  10/2006  . Tubal ligation    . Right renal artery repair  1976  . Right femoral  pseudoaneurysm & right groin hematoma evacuation  02/2007  . Colonoscopy w/ biopsies    .  Esophagogastroduodenoscopy    . Coronary angioplasty      Family History  Problem Relation Age of Onset  . Colitis Father   . Hypertension Mother   . Coronary artery disease Mother   . Stroke Mother   . Stroke Brother   . Colon cancer Paternal Aunt   . Clotting disorder Mother   . Clotting disorder Brother   . Clotting disorder Sister   . Heart disease Mother   . Heart disease Father   . Asthma Daughter   . Allergies Mother   . Asthma Maternal Uncle     History   Social History  . Marital Status: Married    Spouse Name: N/A    Number of Children: 3  . Years of Education: N/A   Occupational History  .      retired   Social History Main Topics  . Smoking status: Never Smoker   . Smokeless tobacco: Never Used  . Alcohol Use: No  . Drug Use: No  . Sexually Active: Not Currently   Other Topics Concern  . 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 amLODipine (NORVASC) 10 MG tablet TAKE 1 TABLET (10 MG TOTAL) BY MOUTH DAILY.  30 tablet  11  . ammonium lactate (LAC-HYDRIN) 12 % lotion Apply 1 application topically daily as needed. For dry skin      . escitalopram (LEXAPRO) 10 MG tablet Take 1 tablet (10 mg total) by mouth daily.  30 tablet  5  . esomeprazole (NEXIUM) 40 MG capsule Take 1 capsule (40 mg total) by mouth 2 (two) times daily.  60 capsule  12  . Glucosamine-Chondroitin (GLUCOSAMINE CHONDR COMPLEX PO) Take 1 tablet by mouth at bedtime. Hold while in hospital      . loperamide (IMODIUM A-D) 2 MG tablet Take 1 mg by mouth daily as needed. Dihrrhea      . losartan (COZAAR) 100 MG tablet TAKE 1 TABLET (100 MG TOTAL) BY MOUTH DAILY.  30 tablet  11  . mesalamine (APRISO) 0.375 G 24 hr capsule Take 1,500 mg by mouth daily. TAKE 2-4 DAILY PER  PROVIDER.      . metoprolol tartrate (LOPRESSOR) 25 MG tablet Take 1 tablet (25 mg total) by mouth 2 (two) times daily.  60 tablet  12  . Multiple Vitamin (MULTIVITAMIN) capsule Take 1 capsule by mouth daily. 1 daily        . nitroGLYCERIN (NITROSTAT) 0.4 MG SL tablet Place 0.4 mg under the tongue every 5 (five) minutes as needed. 1 tab under tongue every 5 min for chest pain not to exceed 3 tabs in 15 min       . Probiotic Product (PHILLIPS COLON HEALTH) CAPS Take 1 capsule by mouth daily. 1 per day      . TIKOSYN 125 MCG capsule TAKE 3 CAPSULES (375 MCG TOTAL) BY MOUTH 2 (TWO) TIMES DAILY.  180 capsule  5  . traMADol (ULTRAM) 50 MG tablet Take 1 tablet (50 mg total) by mouth every 8 (eight) hours as needed for pain.  30 tablet  0  . XARELTO 20 MG TABS TAKE 1 TABLET BY MOUTH DAILY  30 tablet  4   No current facility-administered medications on file prior to visit.    Allergies  Allergen Reactions  . Cefdinir     Pt cannot take; may interfere with AFib.  . Codeine     Heart beats fast   . Guaifenesin Er Nausea And Vomiting and Other (See Comments)    Headaches; can tolerate liquid.  . Latex     Breathing problems  . Mucilgen (Psyllium)   . Percocet (Oxycodone-Acetaminophen)     Itching & swelling in jaw    Review of Systems  Review of Systems  Constitutional: Negative for fever and malaise/fatigue.  HENT: Negative for congestion.   Eyes: Negative for pain and discharge.  Respiratory: Negative for shortness of breath.   Cardiovascular: Negative for chest pain, palpitations and leg swelling.  Gastrointestinal: Positive for abdominal pain. Negative for nausea and diarrhea.  Genitourinary: Negative for dysuria.  Musculoskeletal: Positive for back pain. Negative for falls.  Skin: Negative for rash.  Neurological: Negative for loss of consciousness and headaches.  Endo/Heme/Allergies: Negative for polydipsia.  Psychiatric/Behavioral: Negative for depression and suicidal ideas.  The patient is not nervous/anxious and does not have insomnia.     Objective  BP 130/84  Pulse 56  Temp(Src) 98.4 F (36.9 C) (Oral)  Ht 5\' 3"  (1.6 m)  Wt 242 lb 1.9  oz (109.825 kg)  BMI 42.9 kg/m2  SpO2 91%  Physical Exam  Physical Exam  Constitutional: She is oriented to person, place, and time and well-developed, well-nourished, and in no distress. No distress.  HENT:  Head: Normocephalic and atraumatic.  Eyes: Conjunctivae are normal.  Neck: Neck supple. No thyromegaly present.  Cardiovascular: Normal rate, regular rhythm and normal heart sounds.   Pulmonary/Chest: Effort normal and breath sounds normal. She has no wheezes.  Abdominal: She exhibits no distension and no mass.  Musculoskeletal: She exhibits no edema.  Lymphadenopathy:    She has no cervical adenopathy.  Neurological: She is alert and oriented to person, place, and time.  Skin: Skin is warm and dry. No rash noted. She is not diaphoretic.  Psychiatric: Memory, affect and judgment normal.    Lab Results  Component Value Date   TSH 3.075 02/28/2013   Lab Results  Component Value Date   WBC 9.3 02/28/2013   HGB 13.3 02/28/2013   HCT 40.2 02/28/2013   MCV 89.9 02/28/2013   PLT 271 02/28/2013   Lab Results  Component Value Date   CREATININE 1.12* 02/28/2013   BUN 16 02/28/2013   NA 140 02/28/2013   K 4.1 02/28/2013   CL 104 02/28/2013   CO2 26 02/28/2013   Lab Results  Component Value Date   ALT 15 02/28/2013   AST 16 02/28/2013   ALKPHOS 87 02/28/2013   BILITOT 0.4 02/28/2013   Lab Results  Component Value Date   CHOL 182 02/28/2013   Lab Results  Component Value Date   HDL 38* 02/28/2013   Lab Results  Component Value Date   LDLCALC 113* 02/28/2013   Lab Results  Component Value Date   TRIG 154* 02/28/2013   Lab Results  Component Value Date   CHOLHDL 4.8 02/28/2013     Assessment & Plan  Radicular low back pain Improved 80% from last week, felt the steroid injection really helped. Does still have some pelvic  flood dysfunction will check a UA today.  RENAL INSUFFICIENCY Stable, mild encouraged good hydration. Be careful with OTC meds  OSA (obstructive sleep apnea) Uses her CPAP nightly  HYPERTENSION Well controlled, no changes to meds.  HYPERLIPIDEMIA Encouraged Krill oil and Niacin  Crohn's colitis Mild LLQ pain improving, no fevers/chills, check cbc and sed rate today

## 2013-06-04 NOTE — Assessment & Plan Note (Signed)
Stable, mild encouraged good hydration. Be careful with OTC meds

## 2013-06-04 NOTE — Assessment & Plan Note (Signed)
Encouraged Krill oil and Niacin

## 2013-06-04 NOTE — Progress Notes (Signed)
Quick Note:  Patient Informed during visit and voiced understanding. ______

## 2013-06-04 NOTE — Assessment & Plan Note (Addendum)
Improved 80% from last week, felt the steroid injection really helped. Does still have some pelvic flood dysfunction will check a UA today.

## 2013-06-04 NOTE — Patient Instructions (Addendum)
Add a bedtime snack with protein If still having hot flushes add a krill oil daily such as MegaRed   Chronic Kidney Disease Chronic kidney disease occurs when the kidneys are damaged over a long period. The kidneys are two organs that lie on either side of the spine between the middle of the back and the front of the abdomen. The kidneys:   Remove wastes and extra water from the blood.   Produce important hormones. These help keep bones strong, regulate blood pressure, and help create red blood cells.   Balance the fluids and chemicals in the blood and tissues. A small amount of kidney damage may not cause problems, but a large amount of damage may make it difficult or impossible for the kidneys to work the way they should. If steps are not taken to slow down the kidney damage or stop it from getting worse, the kidneys may stop working permanently. Most of the time, chronic kidney disease does not go away. However, it can often be controlled, and those with the disease can usually live normal lives. CAUSES  The most common causes of chronic kidney disease are diabetes and high blood pressure (hypertension). Chronic kidney disease may also be caused by:   Diseases that cause kidneys' filters to become inflamed.   Diseases that affect the immune system.   Genetic diseases.   Medicines that damage the kidneys, such as anti-inflammatory medicines.  Poisoning or exposure to toxic substances.   A reoccurring kidney or urinary infection.   A problem with urine flow. This may be caused by:   Cancer.   Kidney stones.   An enlarged prostate in males. SYMPTOMS  Because the kidney damage in chronic kidney disease occurs slowly, symptoms develop slowly and may not be obvious until the kidney damage becomes severe. A person may have a kidney disease for years without showing any symptoms. Symptoms can include:   Swelling (edema) of the legs, ankles, or feet.   Tiredness  (lethargy).   Nausea or vomiting.   Confusion.   Problems with urination, such as:   Decreased urine production.   Frequent urination, especially at night.   Frequent accidents in children who are potty trained.   Muscle twitches and cramps.   Shortness of breath.  Weakness.   Persistent itchiness.   Loss of appetite.  Metallic taste in the mouth.  Trouble sleeping.  Slowed development in children.  Short stature in children. DIAGNOSIS  Chronic kidney disease may be detected and diagnosed by tests, including blood, urine, imaging, or kidney biopsy tests.  TREATMENT  Most chronic kidney diseases cannot be cured. Treatment usually involves relieving symptoms and preventing or slowing the progression of the disease. Treatment may include:   A special diet. You may need to avoid alcohol and foods thatare salty and high in potassium.   Medicines. These may:   Lower blood pressure.   Relieve anemia.   Relieve swelling.   Protect the bones. HOME CARE INSTRUCTIONS   Follow your prescribed diet.   Only take over-the-counter or prescription medicines as directed by your caregiver.  Do not take any new medicines (prescription, over-the-counter, or nutritional supplements) unless approved by your caregiver. Many medicines can worsen your kidney damage or need to have the dose adjusted.   Quit smoking if you are a smoker. Talk to your caregiver about a smoking cessation program.   Keep all follow-up appointments as directed by your caregiver. SEEK IMMEDIATE MEDICAL CARE IF:  Your symptoms get  worse or you develop new symptoms.   You develop symptoms of end-stage kidney disease. These include:   Headaches.   Abnormally dark or light skin.   Numbness in the hands or feet.   Easy bruising.   Frequent hiccups.   Menstruation stops.   You have a fever.   You have decreased urine production.   You havepain or bleeding when  urinating. MAKE SURE YOU:  Understand these instructions.  Will watch your condition.  Will get help right away if you are not doing well or get worse. FOR MORE INFORMATION  American Association of Kidney Patients: ResidentialShow.is National Kidney Foundation: www.kidney.org American Kidney Fund: FightingMatch.com.ee Life Options [facility]: www.lifeoptions.org and www.kidneyschool.org Document Released: 07/19/2008 Document Revised: 09/26/2012 Document Reviewed: 06/08/2012 [facility] Patient Information 2014 [place], [place].

## 2013-06-04 NOTE — Assessment & Plan Note (Signed)
Uses her CPAP nightly

## 2013-06-04 NOTE — Assessment & Plan Note (Signed)
Well controlled, no changes to meds 

## 2013-06-04 NOTE — Assessment & Plan Note (Signed)
Mild LLQ pain improving, no fevers/chills, check cbc and sed rate today

## 2013-06-05 LAB — URINE CULTURE: Colony Count: 40000

## 2013-06-05 MED ORDER — CIPROFLOXACIN HCL 250 MG PO TABS: 250.0000 mg | ORAL_TABLET | Freq: Two times a day (BID) | ORAL | Status: DC

## 2013-06-05 NOTE — Progress Notes (Signed)
Quick Note:  Patient Informed and voiced understanding.  RX sent ______ 

## 2013-06-05 NOTE — Progress Notes (Signed)
Pt stated she was still having a little pain

## 2013-06-05 NOTE — Addendum Note (Signed)
Addended by: Court Joy on: 06/05/2013 04:30 PM   Modules accepted: Orders

## 2013-06-06 ENCOUNTER — Other Ambulatory Visit: Payer: Self-pay | Admitting: *Deleted

## 2013-06-06 ENCOUNTER — Telehealth: Payer: Self-pay

## 2013-06-06 ENCOUNTER — Telehealth: Payer: Self-pay | Admitting: Cardiology

## 2013-06-06 DIAGNOSIS — F329 Major depressive disorder, single episode, unspecified: Secondary | ICD-10-CM

## 2013-06-06 MED ORDER — ESCITALOPRAM OXALATE 10 MG PO TABS: 10.0000 mg | ORAL_TABLET | Freq: Every day | ORAL | Status: DC

## 2013-06-06 NOTE — Telephone Encounter (Signed)
Patient left a message stating that she takes Tikosyn and was just prescribed Cipro for her UTI? Pt would like MD to contact cardiologist to see what the correct antibiotic is she should take?  Please advise?

## 2013-06-06 NOTE — Telephone Encounter (Signed)
Reviewed med list and ran a new interaction check, can take Amoxicillin 500 mg po tid x 7 days, no interactions with Tikosyn

## 2013-06-06 NOTE — Telephone Encounter (Signed)
Faxed refill request received from pharmacy for Lexapro Last filled by MD on 02.17.14, #30x5 Last AEX - 08.12.14 Next AEX - 3 Months Refill sent per [facility] refill protocol/SLS

## 2013-06-06 NOTE — Telephone Encounter (Signed)
New Prob  Pt states she has a UTI and was prescribed Cipro by her PCP.  She wants to know what she can take since she is on Tikosyn.

## 2013-06-06 NOTE — Telephone Encounter (Signed)
Spoke with pt.  Cipro may increase QTc.  Unfortunately she does not qualify for other abx- bactrim also contraindicated with Tikosyn and CrCl too low for Nitrofurantoin.  Pt will come into clinic tomorrow to check EKG.  She will have had 3 doses of Cipro at that time.

## 2013-06-07 ENCOUNTER — Ambulatory Visit (INDEPENDENT_AMBULATORY_CARE_PROVIDER_SITE_OTHER): Payer: Medicare Other | Admitting: Pharmacist

## 2013-06-07 ENCOUNTER — Encounter (HOSPITAL_BASED_OUTPATIENT_CLINIC_OR_DEPARTMENT_OTHER): Payer: Self-pay | Admitting: Family Medicine

## 2013-06-07 ENCOUNTER — Emergency Department (HOSPITAL_BASED_OUTPATIENT_CLINIC_OR_DEPARTMENT_OTHER)
Admission: EM | Admit: 2013-06-07 | Discharge: 2013-06-07 | Disposition: A | Discharge status: Home / Self Care | Payer: Medicare Other | Attending: Emergency Medicine | Admitting: Emergency Medicine

## 2013-06-07 ENCOUNTER — Emergency Department (HOSPITAL_BASED_OUTPATIENT_CLINIC_OR_DEPARTMENT_OTHER): Payer: Medicare Other

## 2013-06-07 DIAGNOSIS — Z79899 Other long term (current) drug therapy: Secondary | ICD-10-CM | POA: Diagnosis not present

## 2013-06-07 DIAGNOSIS — Z91040 Latex allergy status: Secondary | ICD-10-CM | POA: Insufficient documentation

## 2013-06-07 DIAGNOSIS — K5190 Ulcerative colitis, unspecified, without complications: Secondary | ICD-10-CM | POA: Diagnosis not present

## 2013-06-07 DIAGNOSIS — G8929 Other chronic pain: Secondary | ICD-10-CM | POA: Insufficient documentation

## 2013-06-07 DIAGNOSIS — I2510 Atherosclerotic heart disease of native coronary artery without angina pectoris: Secondary | ICD-10-CM | POA: Diagnosis not present

## 2013-06-07 DIAGNOSIS — S6990XA Unspecified injury of unspecified wrist, hand and finger(s), initial encounter: Secondary | ICD-10-CM | POA: Diagnosis not present

## 2013-06-07 DIAGNOSIS — S99919A Unspecified injury of unspecified ankle, initial encounter: Secondary | ICD-10-CM | POA: Diagnosis not present

## 2013-06-07 DIAGNOSIS — M25569 Pain in unspecified knee: Secondary | ICD-10-CM | POA: Diagnosis not present

## 2013-06-07 DIAGNOSIS — Z8669 Personal history of other diseases of the nervous system and sense organs: Secondary | ICD-10-CM | POA: Insufficient documentation

## 2013-06-07 DIAGNOSIS — Z8679 Personal history of other diseases of the circulatory system: Secondary | ICD-10-CM | POA: Insufficient documentation

## 2013-06-07 DIAGNOSIS — K219 Gastro-esophageal reflux disease without esophagitis: Secondary | ICD-10-CM | POA: Diagnosis not present

## 2013-06-07 DIAGNOSIS — Z872 Personal history of diseases of the skin and subcutaneous tissue: Secondary | ICD-10-CM | POA: Diagnosis not present

## 2013-06-07 DIAGNOSIS — S8990XA Unspecified injury of unspecified lower leg, initial encounter: Secondary | ICD-10-CM | POA: Insufficient documentation

## 2013-06-07 DIAGNOSIS — Y9389 Activity, other specified: Secondary | ICD-10-CM | POA: Insufficient documentation

## 2013-06-07 DIAGNOSIS — I4891 Unspecified atrial fibrillation: Secondary | ICD-10-CM

## 2013-06-07 DIAGNOSIS — Z8639 Personal history of other endocrine, nutritional and metabolic disease: Secondary | ICD-10-CM | POA: Insufficient documentation

## 2013-06-07 DIAGNOSIS — F3289 Other specified depressive episodes: Secondary | ICD-10-CM | POA: Insufficient documentation

## 2013-06-07 DIAGNOSIS — E669 Obesity, unspecified: Secondary | ICD-10-CM | POA: Insufficient documentation

## 2013-06-07 DIAGNOSIS — Z9861 Coronary angioplasty status: Secondary | ICD-10-CM | POA: Insufficient documentation

## 2013-06-07 DIAGNOSIS — Z862 Personal history of diseases of the blood and blood-forming organs and certain disorders involving the immune mechanism: Secondary | ICD-10-CM | POA: Insufficient documentation

## 2013-06-07 DIAGNOSIS — F411 Generalized anxiety disorder: Secondary | ICD-10-CM | POA: Insufficient documentation

## 2013-06-07 DIAGNOSIS — R52 Pain, unspecified: Secondary | ICD-10-CM | POA: Diagnosis not present

## 2013-06-07 DIAGNOSIS — W010XXA Fall on same level from slipping, tripping and stumbling without subsequent striking against object, initial encounter: Secondary | ICD-10-CM | POA: Insufficient documentation

## 2013-06-07 DIAGNOSIS — F329 Major depressive disorder, single episode, unspecified: Secondary | ICD-10-CM | POA: Diagnosis not present

## 2013-06-07 DIAGNOSIS — M79609 Pain in unspecified limb: Secondary | ICD-10-CM | POA: Diagnosis not present

## 2013-06-07 DIAGNOSIS — I252 Old myocardial infarction: Secondary | ICD-10-CM | POA: Diagnosis not present

## 2013-06-07 DIAGNOSIS — Z8709 Personal history of other diseases of the respiratory system: Secondary | ICD-10-CM | POA: Diagnosis not present

## 2013-06-07 DIAGNOSIS — Y9210 Unspecified residential institution as the place of occurrence of the external cause: Secondary | ICD-10-CM | POA: Insufficient documentation

## 2013-06-07 DIAGNOSIS — Z7901 Long term (current) use of anticoagulants: Secondary | ICD-10-CM | POA: Diagnosis not present

## 2013-06-07 DIAGNOSIS — M129 Arthropathy, unspecified: Secondary | ICD-10-CM | POA: Insufficient documentation

## 2013-06-07 DIAGNOSIS — I10 Essential (primary) hypertension: Secondary | ICD-10-CM | POA: Diagnosis not present

## 2013-06-07 DIAGNOSIS — M2548 Effusion, other site: Secondary | ICD-10-CM | POA: Diagnosis not present

## 2013-06-07 DIAGNOSIS — Z8719 Personal history of other diseases of the digestive system: Secondary | ICD-10-CM | POA: Diagnosis not present

## 2013-06-07 DIAGNOSIS — M25562 Pain in left knee: Secondary | ICD-10-CM

## 2013-06-07 DIAGNOSIS — S99929A Unspecified injury of unspecified foot, initial encounter: Secondary | ICD-10-CM | POA: Insufficient documentation

## 2013-06-07 NOTE — ED Provider Notes (Signed)
CSN: [id number]     Arrival date & time 06/07/13  1413 History     First MD Initiated Contact with Patient 06/07/13 1418     Chief Complaint  Patient presents with  . Fall   (Consider location/radiation/quality/duration/timing/severity/associated sxs/prior Treatment) HPI Comments: Patient is a 67 year old female with history of coronary artery disease, obesity, and arthritis who presents for pain to her right hand and left knee with onset one hour ago. Patient states she was leaving her cardiologist's office after a follow up appointment when she tripped over the sidewalk. Primary impact was to her left knee and right hand, which was outstretched to break her fall. Patient describes the pain in her hand as aching and the pain in her knee as burning in nature. She denies any radiation of the pain. Patient states pain in her knee is worse with weightbearing. Ice has been providing some relief. Patient denies pallor, numbness/tingling, and extremity weakness. Denies hitting her head or LOC. Patient currently taking Xarelto.  Patient is a 67 y.o. female presenting with fall. The history is provided by the patient. No language interpreter was used.  Fall Associated symptoms include arthralgias, joint swelling and myalgias. Pertinent negatives include no fever, numbness, rash or weakness.    Past Medical History  Diagnosis Date  . CAD (coronary artery disease)   . Depression   . GERD (gastroesophageal reflux disease)   . Hyperlipidemia   . Hypertension   . Crohn's colitis     she reports ulcerative colitis beginning in her 29's, biopsies 2008 suggest Crohn's not UC  . Pseudoaneurysm of right femoral artery   . Pancreatitis   . Chronic sinusitis   . Renal oncocytoma     s/p right nephrectomy  . Myocardial infarction   . Uveitis   . Anxiety   . Obesity   . Chronic cough   . OSA (obstructive sleep apnea)   . Paroxysmal atrial fibrillation   . Arthritis   . IBS (irritable bowel  syndrome)   . Blood transfusion without reported diagnosis 1976  . Sleep apnea   . Keloid of skin    Past Surgical History  Procedure Laterality Date  . Nephrectomy  10/2006    right secondary to oncocytoma   . Breast biopsy  1996    benign lesion   . Cholecystectomy  10/2006  . Tubal ligation    . Right renal artery repair  1976  . Right femoral  pseudoaneurysm & right groin hematoma evacuation  02/2007  . Colonoscopy w/ biopsies    . Esophagogastroduodenoscopy    . Coronary angioplasty     Family History  Problem Relation Age of Onset  . Colitis Father   . Hypertension Mother   . Coronary artery disease Mother   . Stroke Mother   . Stroke Brother   . Colon cancer Paternal Aunt   . Clotting disorder Mother   . Clotting disorder Brother   . Clotting disorder Sister   . Heart disease Mother   . Heart disease Father   . Asthma Daughter   . Allergies Mother   . Asthma Maternal Uncle    History  Substance Use Topics  . Smoking status: Never Smoker   . Smokeless tobacco: Never Used  . Alcohol Use: No   OB History   Grav Para Term Preterm Abortions TAB SAB Ect Mult Living                 Review of Systems  Constitutional:  Negative for fever.  Musculoskeletal: Positive for myalgias, joint swelling and arthralgias.  Skin: Negative for pallor and rash.  Neurological: Negative for weakness and numbness.  All other systems reviewed and are negative.   Allergies  Cefdinir; Codeine; Guaifenesin er; Latex; Mucilgen; and Percocet  Home Medications   Current Outpatient Rx  Name  Route  Sig  Dispense  Refill  . amLODipine (NORVASC) 10 MG tablet      TAKE 1 TABLET (10 MG TOTAL) BY MOUTH DAILY.   30 tablet   11   . ammonium lactate (LAC-HYDRIN) 12 % lotion   Topical   Apply 1 application topically daily as needed. For dry skin         . ciprofloxacin (CIPRO) 250 MG tablet   Oral   Take 1 tablet (250 mg total) by mouth 2 (two) times daily. X 7 days   14 tablet    0   . escitalopram (LEXAPRO) 10 MG tablet   Oral   Take 1 tablet (10 mg total) by mouth daily.   30 tablet   2   . esomeprazole (NEXIUM) 40 MG capsule   Oral   Take 1 capsule (40 mg total) by mouth 2 (two) times daily.   60 capsule   12   . Glucosamine-Chondroitin (GLUCOSAMINE CHONDR COMPLEX PO)   Oral   Take 1 tablet by mouth at bedtime. Hold while in hospital         . loperamide (IMODIUM A-D) 2 MG tablet   Oral   Take 1 mg by mouth daily as needed. Dihrrhea         . losartan (COZAAR) 100 MG tablet      TAKE 1 TABLET (100 MG TOTAL) BY MOUTH DAILY.   30 tablet   11   . mesalamine (APRISO) 0.375 G 24 hr capsule   Oral   Take 1,500 mg by mouth daily. TAKE 2-4 DAILY PER PROVIDER.         . metoprolol tartrate (LOPRESSOR) 25 MG tablet   Oral   Take 1 tablet (25 mg total) by mouth 2 (two) times daily.   60 tablet   12   . Multiple Vitamin (MULTIVITAMIN) capsule   Oral   Take 1 capsule by mouth daily. 1 daily           . nitroGLYCERIN (NITROSTAT) 0.4 MG SL tablet   Sublingual   Place 0.4 mg under the tongue every 5 (five) minutes as needed. 1 tab under tongue every 5 min for chest pain not to exceed 3 tabs in 15 min          . Probiotic Product (PHILLIPS COLON HEALTH) CAPS   Oral   Take 1 capsule by mouth daily. 1 per day         . TIKOSYN 125 MCG capsule      TAKE 3 CAPSULES (375 MCG TOTAL) BY MOUTH 2 (TWO) TIMES DAILY.   180 capsule   5   . traMADol (ULTRAM) 50 MG tablet   Oral   Take 1 tablet (50 mg total) by mouth every 8 (eight) hours as needed for pain.   30 tablet   0   . XARELTO 20 MG TABS      TAKE 1 TABLET BY MOUTH DAILY   30 tablet   4    BP 185/64  Pulse 57  Temp(Src) 98.3 F (36.8 C) (Oral)  Resp 18  Ht 5\' 3"  (1.6 m)  Wt 242 lb (  109.77 kg)  BMI 42.88 kg/m2  SpO2 100%  Physical Exam  Nursing note and vitals reviewed. Constitutional: She is oriented to person, place, and time. She appears well-developed and  well-nourished. No distress.  HENT:  Head: Normocephalic and atraumatic.  Eyes: Conjunctivae and EOM are normal. No scleral icterus.  Neck: Normal range of motion.  Cardiovascular: Normal rate, regular rhythm and intact distal pulses.   Distal radial, dorsalis pedis, and posterior tibial pulses 2+ b/l. Capillary refill normal.  Pulmonary/Chest: Effort normal. No respiratory distress.  Musculoskeletal:       Left knee: She exhibits swelling. She exhibits normal range of motion, no effusion, no deformity, no laceration, no erythema, normal alignment, no LCL laxity, no bony tenderness and no MCL laxity. Tenderness found.       Right hand: She exhibits tenderness, bony tenderness and swelling. She exhibits normal range of motion, normal two-point discrimination, normal capillary refill and no deformity. Normal sensation noted. Normal strength noted.       Hands: Neurological: She is alert and oriented to person, place, and time.  No sensory or motor deficits appreciated. DTRs normal and symmetric. Patient was extremities without ataxia.  Skin: Skin is warm and dry. No rash noted. She is not diaphoretic. No erythema. No pallor.  Psychiatric: She has a normal mood and affect. Her behavior is normal.   ED Course   Procedures (including critical care time)  Labs Reviewed - No data to display Dg Knee Complete 4 Views Left  06/07/2013   *RADIOLOGY REPORT*  Clinical Data: Traumatic injury with knee pain  LEFT KNEE - COMPLETE 4+ VIEW  Comparison: None.  Findings: No acute fracture or dislocation is noted.  No joint effusion is seen.  There is a soft tissue calcification noted laterally just beneath the skin surface.  IMPRESSION: No acute bony abnormality is noted.   Original Report Authenticated By: Alcide Clever, M.D.   Dg Hand Complete Right  06/07/2013   *RADIOLOGY REPORT*  Clinical Data: Traumatic injury with right hand pain  RIGHT HAND - COMPLETE 3+ VIEW  Comparison: None.  Findings: No acute  fracture or dislocation is noted.  No gross soft tissue abnormality is seen.  IMPRESSION: No acute abnormality noted.   Original Report Authenticated By: Alcide Clever, M.D.   1. Knee pain, acute, left   2. Pain, hand, right    MDM  Patient presents for R hand and L knee pain after a mechanical fall leaving her cardiologist's office. Patient ambulatory since the incident and neurovascularly intact on arrival.  No sensory or motor deficits appreciated. No pallor, pulselessness, poikilothermia, or paresthesias to suspect complicating injury. X-ray of left knee and right hand without evidence of fracture, dislocation, or other acute bony abnormality. Ace wrap applied to right hand in the ED for added stability. No hematomas, ecchymosis, or evidence of active bleeding or acute hemorrhage. Patient appropriate for discharge with RICE instruction; already with Rx for tramadol for pain control. Indications for ED return discussed and patient agreeable to plan. Orthopedic referral given should symptoms persist despite recommended treatments.     Antony Madura, PA-C 06/07/13 [address], PA-C 06/07/13 [phone]

## 2013-06-07 NOTE — Telephone Encounter (Signed)
FYI: I called to inform pt and she stated that the Cardiologist told pt to take the Cipro and come in today at 1:30 pm for an EKG. Patient states she has had 3 doses

## 2013-06-07 NOTE — ED Notes (Signed)
Pt sts she tripped and fell on sidewalk about an hour ago. Pt c/o left knee pain and swelling and right wrist pain post fall. No loc, did not hit head.

## 2013-06-07 NOTE — ED Provider Notes (Signed)
Medical screening examination/treatment/procedure(s) were performed by non-physician practitioner and as supervising physician I was immediately available for consultation/collaboration.   Alvin Rubano, MD 06/07/13 2343 

## 2013-06-10 ENCOUNTER — Other Ambulatory Visit: Payer: Self-pay | Admitting: Cardiology

## 2013-06-13 NOTE — Progress Notes (Signed)
Pt prescribed Cipro by PCP for UTI.  Cipro may increase QTc with Tikosyn.  QTc on previous EKG- 477.  Pt does not have an ICD placed.  Reviewed other options for treatment for UTI but none acceptable based on pt's allergies, renal function, and/or medication interactions.  Plan to start Cipro and check EKG after first few doses.    Pt started Cipro 250mg  BID on 8/14.  She has had 3 doses until this point.  EKG performed today.  Reviewed by Dr. Clifton James. Sinus bradycardia with  QTc- 446.  Acceptable with Tikosyn.  Okay to continue Cipro.

## 2013-07-04 ENCOUNTER — Ambulatory Visit (INDEPENDENT_AMBULATORY_CARE_PROVIDER_SITE_OTHER): Payer: Medicare Other | Admitting: Cardiology

## 2013-07-04 ENCOUNTER — Encounter: Payer: Self-pay | Admitting: Cardiology

## 2013-07-04 VITALS — BP 150/80 | HR 51 | Wt 242.0 lb

## 2013-07-04 DIAGNOSIS — I4891 Unspecified atrial fibrillation: Secondary | ICD-10-CM | POA: Diagnosis not present

## 2013-07-04 DIAGNOSIS — E785 Hyperlipidemia, unspecified: Secondary | ICD-10-CM

## 2013-07-04 DIAGNOSIS — I10 Essential (primary) hypertension: Secondary | ICD-10-CM | POA: Diagnosis not present

## 2013-07-04 DIAGNOSIS — I2510 Atherosclerotic heart disease of native coronary artery without angina pectoris: Secondary | ICD-10-CM

## 2013-07-04 MED ORDER — DOFETILIDE 125 MCG PO CAPS: 125.0000 mg | ORAL_CAPSULE | Freq: Two times a day (BID) | ORAL | Status: DC

## 2013-07-04 MED ORDER — NITROGLYCERIN 0.4 MG SL SUBL: 0.4000 mg | SUBLINGUAL_TABLET | SUBLINGUAL | Status: DC | PRN

## 2013-07-04 NOTE — Assessment & Plan Note (Signed)
Blood pressure is mildly elevated. However she follows this at home and it is typically controlled. We will make adjustments if her blood pressure remains elevated in the future.

## 2013-07-04 NOTE — Patient Instructions (Addendum)
Your physician recommends that you continue on your current medications as directed. Please refer to the Current Medication list given to you today.  Your physician wants you to follow-up in: 6 months. You will receive a reminder letter in the mail two months in advance. If you don't receive a letter, please call our office to schedule the follow-up appointment.  

## 2013-07-04 NOTE — Assessment & Plan Note (Signed)
Not on aspirin given need for anticoagulations.intolerant to statins.

## 2013-07-04 NOTE — Assessment & Plan Note (Signed)
Patient remains in sinus rhythm. Continue tikosyn and Air traffic controller.

## 2013-07-04 NOTE — Assessment & Plan Note (Signed)
Intolerant to statins. 

## 2013-07-04 NOTE — Progress Notes (Signed)
HPI: Pleasant female with past medical history of coronary artery disease and atrial fibrillation for followup. Last cardiac catheterization on Mar 06, 2007 showed an ejection fraction of 55%. There is nonobstructive plaque in the LAD, and circumflex and the right coronary artery was normal. 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 Also note the patient is extremely sensitive to Coumadin. Carotid Dopplers in February of 2011 showed 0-39% stenosis bilaterally. MRA of the abdomen in September of 2012 showed no left renal artery stenosis and previous right nephrectomy. 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  Echocardiogram in June of 2014 showed normal LV function, grade 1 diastolic dysfunction and mild left atrial enlargement. I last saw her in May 2014. She was in atrial fibrillation and had elective cardioversion. Since that time the patient denies any dyspnea on exertion, orthopnea, PND, pedal edema, palpitations, syncope or chest pain.     Current Outpatient Prescriptions  Medication Sig Dispense Refill  . amLODipine (NORVASC) 10 MG tablet TAKE 1 TABLET (10 MG TOTAL) BY MOUTH DAILY.  30 tablet  11  . ammonium lactate (LAC-HYDRIN) 12 % lotion Apply 1 application topically daily as needed. For dry skin      . escitalopram (LEXAPRO) 10 MG tablet Take 1 tablet (10 mg total) by mouth daily.  30 tablet  2  . esomeprazole (NEXIUM) 40 MG capsule Take 1 capsule (40 mg total) by mouth 2 (two) times daily.  60 capsule  12  . Glucosamine-Chondroitin (GLUCOSAMINE CHONDR COMPLEX PO) Take 1 tablet by mouth at bedtime. Hold while in hospital      . loperamide (IMODIUM A-D) 2 MG tablet Take 1 mg by mouth daily as needed. Dihrrhea      . losartan (COZAAR) 100 MG tablet TAKE 1 TABLET (100 MG TOTAL) BY MOUTH DAILY.  30 tablet  11  . mesalamine (APRISO) 0.375 G 24 hr capsule Take 1,500 mg by  mouth daily. TAKE 2-4 DAILY PER PROVIDER.      . metoprolol tartrate (LOPRESSOR) 25 MG tablet Take 1 tablet (25 mg total) by mouth 2 (two) times daily.  60 tablet  12  . Multiple Vitamin (MULTIVITAMIN) capsule Take 1 capsule by mouth daily. 1 daily        . nitroGLYCERIN (NITROSTAT) 0.4 MG SL tablet Place 0.4 mg under the tongue every 5 (five) minutes as needed. 1 tab under tongue every 5 min for chest pain not to exceed 3 tabs in 15 min       . Probiotic Product (PHILLIPS COLON HEALTH) CAPS Take 1 capsule by mouth daily. 1 per day      . TIKOSYN 125 MCG capsule TAKE 3 CAPSULES (375 MCG TOTAL) BY MOUTH 2 (TWO) TIMES DAILY.  180 capsule  5  . traMADol (ULTRAM) 50 MG tablet Take 1 tablet (50 mg total) by mouth every 8 (eight) hours as needed for pain.  30 tablet  0  . XARELTO 20 MG TABS TAKE 1 TABLET BY MOUTH DAILY  30 tablet  4   No current facility-administered medications for this visit.     Past Medical History  Diagnosis Date  . CAD (coronary artery disease)   . Depression   . GERD (gastroesophageal reflux disease)   . Hyperlipidemia   . Hypertension   . Crohn's colitis     she reports ulcerative colitis beginning in her 57's, biopsies 2008  suggest Crohn's not UC  . Pseudoaneurysm of right femoral artery   . Pancreatitis   . Chronic sinusitis   . Renal oncocytoma     s/p right nephrectomy  . Myocardial infarction   . Uveitis   . Anxiety   . Obesity   . Chronic cough   . OSA (obstructive sleep apnea)   . Paroxysmal atrial fibrillation   . Arthritis   . IBS (irritable bowel syndrome)   . Blood transfusion without reported diagnosis 1976  . Sleep apnea   . Keloid of skin     Past Surgical History  Procedure Laterality Date  . Nephrectomy  10/2006    right secondary to oncocytoma   . Breast biopsy  1996    benign lesion   . Cholecystectomy  10/2006  . Tubal ligation    . Right renal artery repair  1976  . Right femoral  pseudoaneurysm & right groin hematoma evacuation   02/2007  . Colonoscopy w/ biopsies    . Esophagogastroduodenoscopy    . Coronary angioplasty      History   Social History  . Marital Status: Married    Spouse Name: N/A    Number of Children: 3  . Years of Education: N/A   Occupational History  .      retired   Social History Main Topics  . Smoking status: Never Smoker   . Smokeless tobacco: Never Used  . Alcohol Use: No  . Drug Use: No  . Sexual Activity: Not Currently   Other Topics Concern  . 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 Remaining systems are negative.  Physical Exam: Well-developed obese in no acute distress.  Skin is warm and dry.  HEENT is normal.  Neck is supple.  Chest is clear to auscultation with normal expansion.  Cardiovascular exam is regular rate and rhythm.  Abdominal exam nontender or distended. No masses palpated. Extremities show no edema. neuro grossly intact  ECG sinus bradycardia at a rate of 51. No ST changes.

## 2013-07-22 ENCOUNTER — Other Ambulatory Visit: Payer: Self-pay | Admitting: Family Medicine

## 2013-07-22 ENCOUNTER — Telehealth: Payer: Self-pay | Admitting: Family Medicine

## 2013-07-22 DIAGNOSIS — H9193 Unspecified hearing loss, bilateral: Secondary | ICD-10-CM

## 2013-07-22 NOTE — Telephone Encounter (Signed)
Patient states that she has been wearing hearing aids for years but would like to get her hearing checked and get new hearing aids. She [facility] in [place] but they are requesting that patient be referred to them.   [address] F: [phone]

## 2013-07-22 NOTE — Telephone Encounter (Signed)
done

## 2013-07-22 NOTE — Telephone Encounter (Signed)
Please advise 

## 2013-08-01 ENCOUNTER — Other Ambulatory Visit: Payer: Self-pay | Admitting: *Deleted

## 2013-08-01 MED ORDER — DOFETILIDE 125 MCG PO CAPS: ORAL_CAPSULE | ORAL | Status: DC

## 2013-08-02 ENCOUNTER — Ambulatory Visit (INDEPENDENT_AMBULATORY_CARE_PROVIDER_SITE_OTHER): Payer: Medicare Other

## 2013-08-02 DIAGNOSIS — Z23 Encounter for immunization: Secondary | ICD-10-CM | POA: Diagnosis not present

## 2013-08-05 DIAGNOSIS — H905 Unspecified sensorineural hearing loss: Secondary | ICD-10-CM | POA: Diagnosis not present

## 2013-08-08 ENCOUNTER — Other Ambulatory Visit: Payer: Self-pay | Admitting: Family Medicine

## 2013-08-08 DIAGNOSIS — Z1231 Encounter for screening mammogram for malignant neoplasm of breast: Secondary | ICD-10-CM

## 2013-08-19 ENCOUNTER — Ambulatory Visit (HOSPITAL_BASED_OUTPATIENT_CLINIC_OR_DEPARTMENT_OTHER)
Admission: RE | Admit: 2013-08-19 | Discharge: 2013-08-19 | Disposition: A | Discharge status: Home / Self Care | Payer: Medicare Other | Source: Ambulatory Visit | Attending: Family Medicine | Admitting: Family Medicine

## 2013-08-19 DIAGNOSIS — Z1231 Encounter for screening mammogram for malignant neoplasm of breast: Secondary | ICD-10-CM | POA: Diagnosis not present

## 2013-08-26 ENCOUNTER — Encounter: Payer: Self-pay | Admitting: Family Medicine

## 2013-08-26 ENCOUNTER — Ambulatory Visit (INDEPENDENT_AMBULATORY_CARE_PROVIDER_SITE_OTHER): Payer: Medicare Other | Admitting: Family Medicine

## 2013-08-26 VITALS — BP 118/84 | HR 55 | Temp 98.3°F | Ht 63.0 in | Wt 244.1 lb

## 2013-08-26 DIAGNOSIS — I4891 Unspecified atrial fibrillation: Secondary | ICD-10-CM

## 2013-08-26 DIAGNOSIS — R1012 Left upper quadrant pain: Secondary | ICD-10-CM | POA: Diagnosis not present

## 2013-08-26 DIAGNOSIS — I10 Essential (primary) hypertension: Secondary | ICD-10-CM

## 2013-08-26 DIAGNOSIS — E669 Obesity, unspecified: Secondary | ICD-10-CM

## 2013-08-26 DIAGNOSIS — E785 Hyperlipidemia, unspecified: Secondary | ICD-10-CM

## 2013-08-26 DIAGNOSIS — N259 Disorder resulting from impaired renal tubular function, unspecified: Secondary | ICD-10-CM

## 2013-08-26 DIAGNOSIS — R1032 Left lower quadrant pain: Secondary | ICD-10-CM

## 2013-08-26 DIAGNOSIS — R197 Diarrhea, unspecified: Secondary | ICD-10-CM

## 2013-08-26 DIAGNOSIS — K219 Gastro-esophageal reflux disease without esophagitis: Secondary | ICD-10-CM

## 2013-08-26 DIAGNOSIS — M5410 Radiculopathy, site unspecified: Secondary | ICD-10-CM

## 2013-08-26 DIAGNOSIS — IMO0002 Reserved for concepts with insufficient information to code with codable children: Secondary | ICD-10-CM

## 2013-08-26 LAB — CBC
MCH: 30.9 pg (ref 26.0–34.0)
MCHC: 33.7 g/dL (ref 30.0–36.0)
Platelets: 247 10*3/uL (ref 150–400)
RDW: 14.1 % (ref 11.5–15.5)

## 2013-08-26 LAB — RENAL FUNCTION PANEL
Albumin: 3.8 g/dL (ref 3.5–5.2)
BUN: 17 mg/dL (ref 6–23)
CO2: 26 mEq/L (ref 19–32)
Chloride: 102 mEq/L (ref 96–112)
Creat: 1.09 mg/dL (ref 0.50–1.10)
Glucose, Bld: 90 mg/dL (ref 70–99)
Sodium: 139 mEq/L (ref 135–145)

## 2013-08-26 LAB — HEPATIC FUNCTION PANEL
AST: 21 U/L (ref 0–37)
Alkaline Phosphatase: 85 U/L (ref 39–117)
Bilirubin, Direct: 0.1 mg/dL (ref 0.0–0.3)
Total Bilirubin: 0.5 mg/dL (ref 0.3–1.2)

## 2013-08-26 LAB — LIPASE: Lipase: 10 U/L (ref 0–75)

## 2013-08-26 LAB — AMYLASE: Amylase: 34 U/L (ref 0–105)

## 2013-08-26 NOTE — Progress Notes (Signed)
Patient ID: [name], female   DOB: 03/22/1946, 67 y.o.   MRN: [id number] AHJA MARTELLO [id number] 01/23/1946 08/26/2013      Progress Note-Follow Up  Subjective  Chief Complaint  Chief Complaint  Patient presents with  . Follow-up    3 month  . Pain    on left side of lower abd X 2 weeks and pain under left breast X a little over a week    HPI  Patient is a 67 yo female in today with numerous c/o. She is c/o chronic pain, mostly in low back but does note an increase inmid back pain, has hip pain and increased pain in left mid back worse with palpation, better when lying down. No anterior cp/palp/sob/fever/gu c/o. Dysuria is better. Has rare trouble with hard BM causing a tear and a small amount of BRBPR, none recently. Has noted some intermittent skin rash, none now Past Medical History  Diagnosis Date  . CAD (coronary artery disease)   . Depression   . GERD (gastroesophageal reflux disease)   . Hyperlipidemia   . Hypertension   . Crohn's colitis     she reports ulcerative colitis beginning in her 15's, biopsies 2008 suggest Crohn's not UC  . Pseudoaneurysm of right femoral artery   . Pancreatitis   . Chronic sinusitis   . Renal oncocytoma     s/p right nephrectomy  . Myocardial infarction   . Uveitis   . Anxiety   . Obesity   . Chronic cough   . OSA (obstructive sleep apnea)   . Paroxysmal atrial fibrillation   . Arthritis   . IBS (irritable bowel syndrome)   . Blood transfusion without reported diagnosis 1976  . Sleep apnea   . Keloid of skin     Past Surgical History  Procedure Laterality Date  . Nephrectomy  10/2006    right secondary to oncocytoma   . Breast biopsy  1996    benign lesion   . Cholecystectomy  10/2006  . Tubal ligation    . Right renal artery repair  1976  . Right femoral  pseudoaneurysm & right groin hematoma evacuation  02/2007  . Colonoscopy w/ biopsies    . Esophagogastroduodenoscopy    . Coronary angioplasty      Family  History  Problem Relation Age of Onset  . Colitis Father   . Hypertension Mother   . Coronary artery disease Mother   . Stroke Mother   . Stroke Brother   . Colon cancer Paternal Aunt   . Clotting disorder Mother   . Clotting disorder Brother   . Clotting disorder Sister   . Heart disease Mother   . Heart disease Father   . Asthma Daughter   . Allergies Mother   . Asthma Maternal Uncle     History   Social History  . Marital Status: Married    Spouse Name: N/A    Number of Children: 3  . Years of Education: N/A   Occupational History  .      retired   Social History Main Topics  . Smoking status: Never Smoker   . Smokeless tobacco: Never Used  . Alcohol Use: No  . Drug Use: No  . Sexual Activity: Not Currently   Other Topics Concern  . 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 amLODipine (NORVASC) 10 MG tablet TAKE 1 TABLET (10 MG TOTAL) BY MOUTH DAILY.  30 tablet  11  . ammonium lactate (LAC-HYDRIN) 12 % lotion Apply 1 application topically daily as needed. For dry skin      . dofetilide (TIKOSYN) 125 MCG capsule 3 TABLETS BY MOUTH TWICE DAILY  180 capsule  5  . escitalopram (LEXAPRO) 10 MG tablet Take 1 tablet (10 mg total) by mouth daily.  30 tablet  2  . esomeprazole (NEXIUM) 40 MG capsule Take 1 capsule (40 mg total) by mouth 2 (two) times daily.  60 capsule  12  . Glucosamine-Chondroitin (GLUCOSAMINE CHONDR COMPLEX PO) Take 1 tablet by mouth at bedtime. Hold while in hospital      . loperamide (IMODIUM A-D) 2 MG tablet Take 1 mg by mouth daily as needed. Dihrrhea      . losartan (COZAAR) 100 MG tablet TAKE 1 TABLET (100 MG TOTAL) BY MOUTH DAILY.  30 tablet  11  . mesalamine (APRISO) 0.375 G 24 hr capsule Take 1,500 mg by mouth daily.  TAKE 2-4 DAILY PER PROVIDER.      . metoprolol tartrate (LOPRESSOR) 25 MG tablet Take 1 tablet (25 mg total) by mouth 2 (two) times daily.  60 tablet  12  . Multiple Vitamin (MULTIVITAMIN) capsule Take 1 capsule by mouth daily. 1 daily        . nitroGLYCERIN (NITROSTAT) 0.4 MG SL tablet Place 1 tablet (0.4 mg total) under the tongue every 5 (five) minutes as needed. 1 tab under tongue every 5 min for chest pain not to exceed 3 tabs in 15 min  25 tablet  3  . Probiotic Product (PHILLIPS COLON HEALTH) CAPS Take 1 capsule by mouth daily. 1 per day      . traMADol (ULTRAM) 50 MG tablet Take 1 tablet (50 mg total) by mouth every 8 (eight) hours as needed for pain.  30 tablet  0  . XARELTO 20 MG TABS TAKE 1 TABLET BY MOUTH DAILY  30 tablet  4   No current facility-administered medications on file prior to visit.    Allergies  Allergen Reactions  . Cefdinir     Pt cannot take; may interfere with AFib.  . Codeine     Heart beats fast   . Guaifenesin Er Nausea And Vomiting and Other (See Comments)    Headaches; can tolerate liquid.  . Latex     Breathing problems  . Mucilgen [Psyllium]   . Percocet [Oxycodone-Acetaminophen]     Itching & swelling in jaw    Review of Systems  Review of Systems  Constitutional: Negative for fever and malaise/fatigue.  HENT: Negative for congestion.   Eyes: Negative for discharge.  Respiratory: Negative for shortness of breath.   Cardiovascular: Negative for chest pain, palpitations and leg swelling.  Gastrointestinal: Positive for abdominal pain. Negative for heartburn, nausea, vomiting and diarrhea.  Genitourinary: Positive for dysuria.  Musculoskeletal: Negative for falls.  Skin: Negative for rash.  Neurological: Negative for loss of consciousness and headaches.  Endo/Heme/Allergies: Negative for polydipsia.  Psychiatric/Behavioral: Negative for depression and suicidal ideas. The patient is not nervous/anxious and does not have insomnia.      Objective  BP 118/84  Pulse 55  Temp(Src) 98.3 F (36.8 C) (Oral)  Ht 5\' 3"  (1.6 m)  Wt 244 lb 1.9 oz (110.732 kg)  BMI 43.25  kg/m2  SpO2 95%  Physical Exam  Physical Exam  Constitutional: She is oriented to person, place, and time and well-developed, well-nourished, and in no distress. No distress.  HENT:  Head: Normocephalic and atraumatic.  Eyes: Conjunctivae are normal.  Neck: Neck supple. No thyromegaly present.  Cardiovascular: Normal rate, regular rhythm and normal heart sounds.   No murmur heard. Pulmonary/Chest: Effort normal and breath sounds normal. She has no wheezes.  Abdominal: Soft. Bowel sounds are normal. She exhibits no distension and no mass. There is tenderness.  Musculoskeletal: She exhibits no edema.  Lymphadenopathy:    She has no cervical adenopathy.  Neurological: She is alert and oriented to person, place, and time.  Skin: Skin is warm and dry. No rash noted. She is not diaphoretic.  Psychiatric: Memory, affect and judgment normal.    Lab Results  Component Value Date   TSH 3.075 02/28/2013   Lab Results  Component Value Date   WBC 10.0 06/04/2013   HGB 13.8 06/04/2013   HCT 41.7 06/04/2013   MCV 89.7 06/04/2013   PLT 278 06/04/2013   Lab Results  Component Value Date   CREATININE 1.18* 06/04/2013   BUN 23 06/04/2013   NA 139 06/04/2013   K 4.7 06/04/2013   CL 102 06/04/2013   CO2 28 06/04/2013   Lab Results  Component Value Date   ALT 15 06/04/2013   AST 16 06/04/2013   ALKPHOS 92 06/04/2013   BILITOT 0.6 06/04/2013   Lab Results  Component Value Date   CHOL 182 02/28/2013   Lab Results  Component Value Date   HDL 38* 02/28/2013   Lab Results  Component Value Date   LDLCALC 113* 02/28/2013   Lab Results  Component Value Date   TRIG 154* 02/28/2013   Lab Results  Component Value Date   CHOLHDL 4.8 02/28/2013     Assessment & Plan  RENAL INSUFFICIENCY Mild, will monitor  OBESITY Encouraged DASH diet and exercise as  tolerated.  HYPERLIPIDEMIA Avoid trans fats, increase activity, minimize simple carbs and saturated fats. Try krill oil caps  Radicular low back pain Now with some mid back and low back as well as hip pain. encourged Salon Pas, Tramadol, moist heat and report worsening symptoms.  GERD Avoid offending foods. Start a probiotics  HYPERTENSION Well controlled, no changes.   ATRIAL FIBRILLATION Rate controlled, no changes.

## 2013-08-26 NOTE — Patient Instructions (Addendum)
Try Salon Pas patches   Muscle Strain Muscle strain occurs when a muscle is stretched beyond its normal length. A small number of muscle fibers generally are torn. This is especially common in athletes. This happens when a sudden, violent force placed on a muscle stretches it too far. Usually, recovery from muscle strain takes 1 to 2 weeks. Complete healing will take 5 to 6 weeks.  HOME CARE INSTRUCTIONS   While awake, apply ice to the sore muscle for the first 2 days after the injury.  Put ice in a plastic bag.  Place a towel between your skin and the bag.  Leave the ice on for 15-20 minutes each hour.  Do not use the strained muscle for several days, until you no longer have pain.  You may wrap the injured area with an elastic bandage for comfort. Be careful not to wrap it too tightly. This may interfere with blood circulation or increase swelling.  Only take over-the-counter or prescription medicines for pain, discomfort, or fever as directed by your caregiver. SEEK MEDICAL CARE IF:  You have increasing pain or swelling in the injured area. MAKE SURE YOU:   Understand these instructions.  Will watch your condition.  Will get help right away if you are not doing well or get worse. Document Released: 10/10/2005 Document Revised: 01/02/2012 Document Reviewed: 10/22/2011 [facility] Patient Information 2014 [place], [place].

## 2013-08-27 ENCOUNTER — Ambulatory Visit (HOSPITAL_BASED_OUTPATIENT_CLINIC_OR_DEPARTMENT_OTHER)
Admission: RE | Admit: 2013-08-27 | Discharge: 2013-08-27 | Disposition: A | Discharge status: Home / Self Care | Payer: Medicare Other | Source: Ambulatory Visit | Attending: Family Medicine | Admitting: Family Medicine

## 2013-08-27 DIAGNOSIS — K5090 Crohn's disease, unspecified, without complications: Secondary | ICD-10-CM | POA: Insufficient documentation

## 2013-08-27 DIAGNOSIS — R1032 Left lower quadrant pain: Secondary | ICD-10-CM

## 2013-08-27 DIAGNOSIS — Z9089 Acquired absence of other organs: Secondary | ICD-10-CM | POA: Diagnosis not present

## 2013-08-27 DIAGNOSIS — R197 Diarrhea, unspecified: Secondary | ICD-10-CM

## 2013-08-27 DIAGNOSIS — R109 Unspecified abdominal pain: Secondary | ICD-10-CM | POA: Diagnosis not present

## 2013-08-27 DIAGNOSIS — K56609 Unspecified intestinal obstruction, unspecified as to partial versus complete obstruction: Secondary | ICD-10-CM | POA: Diagnosis not present

## 2013-08-27 DIAGNOSIS — R1012 Left upper quadrant pain: Secondary | ICD-10-CM

## 2013-08-27 DIAGNOSIS — Z905 Acquired absence of kidney: Secondary | ICD-10-CM | POA: Diagnosis not present

## 2013-08-27 LAB — SEDIMENTATION RATE: Sed Rate: 36 mm/hr — ABNORMAL HIGH (ref 0–22)

## 2013-08-28 ENCOUNTER — Telehealth: Payer: Self-pay

## 2013-08-28 LAB — URINALYSIS

## 2013-08-28 NOTE — Telephone Encounter (Signed)
Message copied by Eulis Manly on Wed Aug 28, 2013 11:44 AM ------      Message from: Danise Edge A      Created: Tue Aug 27, 2013 12:45 PM       Notify increased bowel gas but no other concerns identified today, let us know if pain worsens ------

## 2013-08-28 NOTE — Telephone Encounter (Signed)
Patient called and informed did not state that pain had became worse.

## 2013-08-29 ENCOUNTER — Telehealth: Payer: Self-pay | Admitting: *Deleted

## 2013-08-29 NOTE — Telephone Encounter (Signed)
Patient would like to know if she should be concerned RE: bloodwork done on Monday that SED Rate increased from [8] in August to [36] from Monday/SLS Please Advise.

## 2013-08-29 NOTE — Telephone Encounter (Signed)
So she just needs to keep in mind the sed rate is very nonspecific and there is no specific treatment for it. She has been 53 and 32 in the past so the more recent number is more consistent with her past than her last number. It is a marker for inflammation, women on average tend to be a little higher than med, also she has numerous inflammatory conditions such as crohns and uveitis that can contribute to this. The trick is just to treat any acute illness and to use this test to monitor. Eating a clean diet, exercising and taking Krill oil daily can help this, she could start Turmeric supplements of an 81 mg aspirin daily as well these would help with her numbers.

## 2013-09-01 NOTE — Assessment & Plan Note (Signed)
Mild, will monitor 

## 2013-09-01 NOTE — Assessment & Plan Note (Signed)
Encouraged DASH diet and exercise as tolerated.

## 2013-09-01 NOTE — Assessment & Plan Note (Signed)
Rate controlled, no changes 

## 2013-09-01 NOTE — Assessment & Plan Note (Signed)
Now with some mid back and low back as well as hip pain. encourged Salon Pas, Tramadol, moist heat and report worsening symptoms.

## 2013-09-01 NOTE — Assessment & Plan Note (Signed)
Avoid offending foods. Start a probiotics

## 2013-09-01 NOTE — Assessment & Plan Note (Signed)
Avoid trans fats, increase activity, minimize simple carbs and saturated fats. Try krill oil caps

## 2013-09-01 NOTE — Assessment & Plan Note (Signed)
Well controlled, no changes 

## 2013-09-02 ENCOUNTER — Other Ambulatory Visit: Payer: Self-pay | Admitting: Family Medicine

## 2013-09-30 ENCOUNTER — Telehealth: Payer: Self-pay | Admitting: *Deleted

## 2013-09-30 ENCOUNTER — Other Ambulatory Visit: Payer: Self-pay | Admitting: Cardiology

## 2013-09-30 NOTE — Telephone Encounter (Signed)
She stated that she needs tikosyn samples but we dont have those, and she said that if she ever had a problem getting them to notify you. Please advise. Thanks, MI

## 2013-09-30 NOTE — Telephone Encounter (Signed)
Have already spoke with pt, she got everything straightened out with the pharm. She does not need anything now

## 2013-09-30 NOTE — Telephone Encounter (Signed)
Patient stated that she is not able to get her tykosin from the pharmacy

## 2013-10-11 ENCOUNTER — Telehealth: Payer: Self-pay | Admitting: Internal Medicine

## 2013-10-11 NOTE — Telephone Encounter (Signed)
Patient reports that she is having bleeding from her hemorrhoids.  She will come in and see Mike Gip PA on 10/14/13 9:00 for eval.

## 2013-10-14 ENCOUNTER — Encounter: Payer: Self-pay | Admitting: Physician Assistant

## 2013-10-14 ENCOUNTER — Ambulatory Visit (INDEPENDENT_AMBULATORY_CARE_PROVIDER_SITE_OTHER): Payer: Medicare Other | Admitting: Physician Assistant

## 2013-10-14 ENCOUNTER — Other Ambulatory Visit (INDEPENDENT_AMBULATORY_CARE_PROVIDER_SITE_OTHER): Payer: Medicare Other

## 2013-10-14 VITALS — BP 128/72 | HR 60 | Ht 63.0 in | Wt 248.1 lb

## 2013-10-14 DIAGNOSIS — K5730 Diverticulosis of large intestine without perforation or abscess without bleeding: Secondary | ICD-10-CM | POA: Diagnosis not present

## 2013-10-14 DIAGNOSIS — K625 Hemorrhage of anus and rectum: Secondary | ICD-10-CM

## 2013-10-14 DIAGNOSIS — R1032 Left lower quadrant pain: Secondary | ICD-10-CM | POA: Diagnosis not present

## 2013-10-14 DIAGNOSIS — K5010 Crohn's disease of large intestine without complications: Secondary | ICD-10-CM | POA: Diagnosis not present

## 2013-10-14 DIAGNOSIS — K648 Other hemorrhoids: Secondary | ICD-10-CM

## 2013-10-14 LAB — COMPREHENSIVE METABOLIC PANEL
AST: 22 U/L (ref 0–37)
Albumin: 3.8 g/dL (ref 3.5–5.2)
Alkaline Phosphatase: 73 U/L (ref 39–117)
BUN: 16 mg/dL (ref 6–23)
CO2: 25 mEq/L (ref 19–32)
Calcium: 9.1 mg/dL (ref 8.4–10.5)
Creatinine, Ser: 1.1 mg/dL (ref 0.4–1.2)
Glucose, Bld: 97 mg/dL (ref 70–99)
Sodium: 139 mEq/L (ref 135–145)
Total Bilirubin: 0.5 mg/dL (ref 0.3–1.2)
Total Protein: 7.7 g/dL (ref 6.0–8.3)

## 2013-10-14 LAB — CBC WITH DIFFERENTIAL/PLATELET
Eosinophils Relative: 2.7 % (ref 0.0–5.0)
HCT: 40.7 % (ref 36.0–46.0)
Lymphocytes Relative: 20.6 % (ref 12.0–46.0)
Lymphs Abs: 1.9 10*3/uL (ref 0.7–4.0)
MCHC: 33.4 g/dL (ref 30.0–36.0)
MCV: 91.8 fl (ref 78.0–100.0)
Monocytes Relative: 5.9 % (ref 3.0–12.0)
Neutro Abs: 6.5 10*3/uL (ref 1.4–7.7)
Neutrophils Relative %: 70.7 % (ref 43.0–77.0)
Platelets: 250 10*3/uL (ref 150.0–400.0)
RBC: 4.44 Mil/uL (ref 3.87–5.11)
WBC: 9.1 10*3/uL (ref 4.5–10.5)

## 2013-10-14 NOTE — Patient Instructions (Signed)
You have been scheduled for a CT scan of the abdomen and pelvis at [facility] ([address]---this is in the same building as [facility]).   You are scheduled on  12/23 at  3pm. You should arrive 15 minutes prior to your appointment time for registration. Please follow the written instructions below on the day of your exam:  WARNING: IF YOU ARE ALLERGIC TO IODINE/X-RAY DYE, PLEASE NOTIFY RADIOLOGY IMMEDIATELY AT [phone]! YOU WILL BE GIVEN A 13 HOUR PREMEDICATION PREP.  1) Do not eat or drink anything after 11am (4 hours prior to your test) 2) You have been given 2 bottles of oral contrast to drink. The solution may taste               better if refrigerated, but do NOT add ice or any other liquid to this solution. Shake             well before drinking.    Drink 1 bottle of contrast @ 1pm (2 hours prior to your exam)  Drink 1 bottle of contrast @ 2pm (1 hour prior to your exam)  You may take any medications as prescribed with a small amount of water except for the following: Metformin, Glucophage, Glucovance, Avandamet, Riomet, Fortamet, Actoplus Met, Janumet, Glumetza or Metaglip. The above medications must be held the day of the exam AND 48 hours after the exam.  The purpose of you drinking the oral contrast is to aid in the visualization of your intestinal tract. The contrast solution may cause some diarrhea. Before your exam is started, you will be given a small amount of fluid to drink. Depending on your individual set of symptoms, you may also receive an intravenous injection of x-ray contrast/dye. Plan on being at [facility] for 30 minutes or long, depending on the type of exam you are having performed.  This test typically takes 30-45 minutes to complete.  If you have any questions regarding your exam or if you need to reschedule, you may call the CT department at [phone] between the hours of 8:00 am and 5:00 pm, Monday-Friday.   Go to the  basement for labs today ________________________________________________________________________

## 2013-10-14 NOTE — Progress Notes (Signed)
Subjective:    Patient ID: [name], female    DOB: 1945-11-19, 67 y.o.   MRN: [id number]  HPI  Altair is a very nice 67 year old female known to Dr. Leone Payor who has history of Crohn's colitis which has been in remission, she also has known severe diverticulosis of the left colon and history of adenomatous colon polyps. She last had colonoscopy in November of 2013 with finding of 3 diminutive polyps which were adenomas small to medium internal hemorrhoids severe diverticulosis but no evidence of active Crohn's. She  Is scheduled for followup colonoscopy in November of 2015.  Patient also has history of coronary artery disease is status post previous MI and has history of atrial fibrillation for which she is on  Xarelto. She comes in today stating that she hasn't been feeling very well over the past couple of months and has had somewhat progressive left-sided abdominal pain. She said she had intermittent pain over the past 3-4 months but over the past couple weeks has noticed more constant discomfort. She describes this as a burning aching pain with some radiation to the center of her abdomen. She's not had any fever or chills. Her appetite has been fair though she says she just doesn't feel good. She's not had any change in her bowel habits and normally has 3-4 loose stools per day. She has been seen very small amounts of bright red blood on the tissue intermittently over the past one year. She has attributed this to hemorrhoids. Last week she had one episode of seeing a small amount of bright red blood in the commode but has not noticed blood mixed with her bowel movements and says that has not recurred since.  No maintenance medication for Crohn's.  Review of Systems  Constitutional: Positive for activity change, appetite change and fatigue.  HENT: Negative.   Eyes: Negative.   Respiratory: Negative.   Cardiovascular: Negative.   Gastrointestinal: Positive for abdominal pain and anal  bleeding.  Endocrine: Negative.   Genitourinary: Negative.   Musculoskeletal: Positive for back pain.  Allergic/Immunologic: Negative.   Neurological: Negative.   Hematological: Negative.   Psychiatric/Behavioral: Negative.    Outpatient Prescriptions Prior to Visit  Medication Sig Dispense Refill  . amLODipine (NORVASC) 10 MG tablet TAKE 1 TABLET (10 MG TOTAL) BY MOUTH DAILY.  30 tablet  11  . ammonium lactate (LAC-HYDRIN) 12 % lotion Apply 1 application topically daily as needed. For dry skin      . dofetilide (TIKOSYN) 125 MCG capsule 3 TABLETS BY MOUTH TWICE DAILY  180 capsule  5  . escitalopram (LEXAPRO) 10 MG tablet TAKE 1 TABLET (10 MG TOTAL) BY MOUTH DAILY.  30 tablet  2  . esomeprazole (NEXIUM) 40 MG capsule Take 1 capsule (40 mg total) by mouth 2 (two) times daily.  60 capsule  12  . Glucosamine-Chondroitin (GLUCOSAMINE CHONDR COMPLEX PO) Take 1 tablet by mouth at bedtime. Hold while in hospital      . loperamide (IMODIUM A-D) 2 MG tablet Take 1 mg by mouth daily as needed. Dihrrhea      . losartan (COZAAR) 100 MG tablet TAKE 1 TABLET (100 MG TOTAL) BY MOUTH DAILY.  30 tablet  11  . mesalamine (APRISO) 0.375 G 24 hr capsule Take 1,500 mg by mouth daily. TAKE 2-4 DAILY PER PROVIDER.      . metoprolol tartrate (LOPRESSOR) 25 MG tablet Take 1 tablet (25 mg total) by mouth 2 (two) times daily.  60 tablet  12  . Multiple Vitamin (MULTIVITAMIN) capsule Take 1 capsule by mouth daily. 1 daily        . nitroGLYCERIN (NITROSTAT) 0.4 MG SL tablet Place 1 tablet (0.4 mg total) under the tongue every 5 (five) minutes as needed. 1 tab under tongue every 5 min for chest pain not to exceed 3 tabs in 15 min  25 tablet  3  . Probiotic Product (PHILLIPS COLON HEALTH) CAPS Take 1 capsule by mouth daily. 1 per day      . XARELTO 20 MG TABS tablet TAKE 1 TABLET BY MOUTH DAILY  30 tablet  8  . traMADol (ULTRAM) 50 MG tablet Take 1 tablet (50 mg total) by mouth every 8 (eight) hours as needed for pain.   30 tablet  0   No facility-administered medications prior to visit.   Allergies  Allergen Reactions  . Cefdinir     Pt cannot take; may interfere with AFib.  . Codeine     Heart beats fast   . Guaifenesin Er Nausea And Vomiting and Other (See Comments)    Headaches; can tolerate liquid.  . Latex     Breathing problems  . Mucilgen [Psyllium]   . Percocet [Oxycodone-Acetaminophen]     Itching & swelling in jaw   Patient Active Problem List   Diagnosis Date Noted  . Keloid of skin   . Bilateral leg edema 09/12/2012  . IBS (irritable bowel syndrome) 08/21/2012  . Routine gynecological examination 08/17/2012  . Radicular low back pain 06/28/2012  . Chest pain 06/20/2012  . Basal cell carcinoma 02/07/2012  . Maxillary sinus cyst 02/07/2012  . Diverticulosis 07/08/2011  . Statin intolerance 02/28/2011  . Crohn's colitis   . RENAL INSUFFICIENCY 03/08/2010  . CAROTID BRUIT 12/10/2009  . HYPERGLYCEMIA 07/02/2009  . HEADACHE 02/24/2009  . ATRIAL FIBRILLATION 01/15/2009  . OBESITY 01/03/2008  . OSA (obstructive sleep apnea) 01/03/2008  . OSTEOPOROSIS 11/05/2007  . HYPERLIPIDEMIA 09/06/2007  . DEPRESSION/ANXIETY 09/06/2007  . HEARING LOSS 09/06/2007  . HYPERTENSION 09/06/2007  . MYOCARDIAL INFARCTION, HX OF 09/06/2007  . Coronary atherosclerosis of unspecified type of vessel, native or graft 09/06/2007  . ALLERGIC RHINITIS 09/06/2007  . GERD 09/06/2007  . COLONIC POLYPS, ADENOMATOUS, HX OF 07/04/2007   History  Substance Use Topics  . Smoking status: Never Smoker   . Smokeless tobacco: Never Used  . Alcohol Use: No   family history includes Allergies in her mother; Asthma in her daughter and maternal uncle; Clotting disorder in her brother, mother, and sister; Colitis in her father; Colon cancer in her paternal aunt; Coronary artery disease in her mother; Heart disease in her father and mother; Hypertension in her mother; Stroke in her brother and mother.     Objective:     Physical Exam  well-developed older white female in no acute distress, pleasant blood pressure 120/72 pulse 60 height 5 foot 3 weight 248. HEENT; nontraumatic normocephalic EOMI PERRLA sclera anicteric, Supple; no JVD, Cardiovascular regular; rate and rhythm with S1-S2 no murmur or gallop, Pulmonary; clear bilaterally, Abdomen; large soft she is tender along the left mid quadrant left lower quadrant no palpable mass or hepatosplenomegaly no guarding or rebound, Rectal; exam patient declined, Extremities; no clubbing cyanosis or edema skin warm and dry, Psych; mood and affect normal and appropriate        Assessment & Plan:  #1  67 yo old female with history of Crohn's colitis and severe diverticular disease who comes in with complaints of 3-4  month history of left-sided abdominal pain progressive over the past few weeks. Patient also with generalized fatigue. I suspect she is having an exacerbation of Crohn's colitis, less likely diverticulitis given the chronicity of symptoms over the past few months.  #2 scant rectal bleeding likely internal hemorrhoids especially in the setting of anticoagulation #3 history of adenomatous colon polyps due for followup colonoscopy November 2015 #4 chronic anticoagulation with Xarelto #5 atrial fibrillation #6 coronary artery disease status post MI #7 hyperlipidemia #8 chronic GERD  Plan; CBC with differential, CMET and sedimentation rate Schedule for CT of the abdomen and pelvis with contrast Start Lialda  1.2 g 2 tablets twice daily Patient was also given samples of Ulceris 9 mg once daily-have asked her to wait until we get the CT report and then we'll decide if this is indicated Patient will need office followup in 2 weeks with myself or Dr. Leone Payor

## 2013-10-15 ENCOUNTER — Inpatient Hospital Stay: Admission: RE | Admit: 2013-10-15 | Payer: Medicare Other | Source: Ambulatory Visit

## 2013-10-15 NOTE — Progress Notes (Signed)
Agree with Ms. Esterwood's assessment and plan. Marvel Mcphillips E. Tanaya Dunigan, MD, FACG   

## 2013-10-18 ENCOUNTER — Ambulatory Visit: Admission: RE | Admit: 2013-10-18 | Payer: Medicare Other | Source: Ambulatory Visit

## 2013-10-21 ENCOUNTER — Telehealth: Payer: Self-pay | Admitting: Physician Assistant

## 2013-10-21 ENCOUNTER — Telehealth: Payer: Self-pay | Admitting: Nurse Practitioner

## 2013-10-21 MED ORDER — BUDESONIDE 9 MG PO TB24: 9.0000 mg | ORAL_TABLET | Freq: Every morning | ORAL | Status: DC

## 2013-10-21 NOTE — Telephone Encounter (Signed)
Office closed Friday. I received a call from Radiology. Amy had scheduled patient for CTscan. Radiology was unable to start IV on patient. I cancelled CTscan and told patient that our office would be in touch with her. Non-contrast study would have been helpful for bowel inflammation but given only limited vascular / organ information. Amy Monica Becton is out of town, will forward to patient's primary GI.

## 2013-10-21 NOTE — Telephone Encounter (Signed)
1 month on Uceris would be good - doubt on formulary - see if we can sample her for as long as possible or can try an Rx  I believe she has f/u w/ me in Jan but please be sure

## 2013-10-21 NOTE — Telephone Encounter (Signed)
See other encounter handled by Darcey Nora, RN

## 2013-10-21 NOTE — Telephone Encounter (Signed)
She is better.  Significant improvement on Uceris.  She was only given samples of uceris. No further bleeding or pain.  Please advise how long you want her on Uceris and the next step

## 2013-10-21 NOTE — Telephone Encounter (Signed)
Please get an update on the patient  As long as not worse or better can hold off on CT I suspect  Let me know

## 2013-10-21 NOTE — Telephone Encounter (Signed)
Samples provided for 22 days.  She has follow up 11/08/13

## 2013-10-31 ENCOUNTER — Other Ambulatory Visit: Payer: Self-pay | Admitting: Cardiology

## 2013-10-31 ENCOUNTER — Telehealth: Payer: Self-pay

## 2013-10-31 ENCOUNTER — Telehealth: Payer: Self-pay | Admitting: Family Medicine

## 2013-10-31 MED ORDER — ESCITALOPRAM OXALATE 10 MG PO TABS: 10.0000 mg | ORAL_TABLET | Freq: Every day | ORAL | Status: DC

## 2013-10-31 NOTE — Telephone Encounter (Signed)
refill-escitalopram 10mg  tablet. Take one tablet by mouth daily. Qty 30 last fill 10.13.14

## 2013-10-31 NOTE — Telephone Encounter (Signed)
Patient called wanting samples of tikosyn, but we did not have any . She said that [facility] had order it and it would be in this afternoon. I told her to call other pharmacy to see if she could find some

## 2013-11-06 ENCOUNTER — Other Ambulatory Visit: Payer: Self-pay | Admitting: Cardiology

## 2013-11-08 ENCOUNTER — Encounter: Payer: Self-pay | Admitting: Internal Medicine

## 2013-11-08 ENCOUNTER — Ambulatory Visit (INDEPENDENT_AMBULATORY_CARE_PROVIDER_SITE_OTHER): Payer: Medicare Other | Admitting: Internal Medicine

## 2013-11-08 VITALS — BP 140/60 | HR 64 | Ht 63.0 in | Wt 248.4 lb

## 2013-11-08 DIAGNOSIS — K5010 Crohn's disease of large intestine without complications: Secondary | ICD-10-CM

## 2013-11-08 DIAGNOSIS — M810 Age-related osteoporosis without current pathological fracture: Secondary | ICD-10-CM

## 2013-11-08 MED ORDER — MESALAMINE ER 0.375 G PO CP24: 1500.0000 mg | ORAL_CAPSULE | Freq: Every day | ORAL | Status: DC

## 2013-11-08 NOTE — Assessment & Plan Note (Signed)
Improved 2 more weeks of Uceris Take full dose Apriso F/u prn or by 08/2014 colonoscopy

## 2013-11-08 NOTE — Progress Notes (Signed)
         Subjective:    Patient ID: [name], female    DOB: [birth year], 68 y.o.   MRN: [id number]  HPI Is here for followup. She was seen in December with diarrhea and abdominal pain. She had been taking her L. at half dose. She was started on desonide 9 mg daily with samples, and her abdominal pain diarrhea and rectal bleeding resolved. She feels back to baseline at this time with occasional twinges of left lower quadrant pain. She was to have a CT scan but couldn't get IV access so since she was improved we canceled that. Medications, allergies, past medical history, past surgical history, family history and social history are reviewed and updated in the EMR.   Review of Systems As above    Objective:   Physical Exam Obese no acute distress Abdomen is soft and nontender       Assessment & Plan:   1. Crohn's colitis   2. OSTEOPOROSIS

## 2013-11-08 NOTE — Patient Instructions (Signed)
Today we are providing you with samples of Uceris, use all these and then stop.   Please discuss your osteoporosis with Dr. Charlett Blake.   I appreciate the opportunity to care for you.

## 2013-11-08 NOTE — Assessment & Plan Note (Signed)
To f/u PCP - I asked her to discuss with Dr. Charlett Blake

## 2013-11-11 ENCOUNTER — Ambulatory Visit (INDEPENDENT_AMBULATORY_CARE_PROVIDER_SITE_OTHER): Payer: Medicare Other | Admitting: Family

## 2013-11-11 ENCOUNTER — Encounter: Payer: Self-pay | Admitting: Family

## 2013-11-11 VITALS — BP 142/78 | HR 60 | Temp 97.9°F | Resp 16 | Ht 63.25 in | Wt 246.1 lb

## 2013-11-11 DIAGNOSIS — L538 Other specified erythematous conditions: Secondary | ICD-10-CM | POA: Diagnosis not present

## 2013-11-11 DIAGNOSIS — L304 Erythema intertrigo: Secondary | ICD-10-CM

## 2013-11-11 MED ORDER — FLUCONAZOLE 150 MG PO TABS: ORAL_TABLET | ORAL | Status: DC

## 2013-11-11 NOTE — Progress Notes (Signed)
Pre visit review using our clinic review tool, if applicable. No additional management support is needed unless otherwise documented below in the visit note. 

## 2013-11-11 NOTE — Progress Notes (Signed)
Subjective:    Patient ID: [name], female    DOB: [birth year], 68 y.o.   MRN: [id number]  HPI  [name] is a 68 yr old female who presents today with chief complaint of infection in the right inguinal area.  Reports previous hx of heart cath.  Develops occasional redness in the area.  Uses antifungal powder. Used antifungal powder without improvement.  Denies fever. Felt cold on Saturday. Reports + odor.     Review of Systems See HPI  Past Medical History  Diagnosis Date  . CAD (coronary artery disease)   . Depression   . GERD (gastroesophageal reflux disease)   . Hyperlipidemia   . Hypertension   . Crohn's colitis     she reports ulcerative colitis beginning in her 34's, biopsies 2008 suggest Crohn's not UC  . Pseudoaneurysm of right femoral artery   . Pancreatitis   . Chronic sinusitis   . Renal oncocytoma     s/p right nephrectomy  . Myocardial infarction   . Uveitis   . Anxiety   . Obesity   . Chronic cough   . OSA (obstructive sleep apnea)   . Paroxysmal atrial fibrillation   . Arthritis   . IBS (irritable bowel syndrome)   . Blood transfusion without reported diagnosis 1976  . Sleep apnea   . Keloid of skin     History   Social History  . Marital Status: Married    Spouse Name: N/A    Number of Children: 3  . Years of Education: N/A   Occupational History  .      retired   Social History Main Topics  . Smoking status: Never Smoker   . Smokeless tobacco: Never Used  . Alcohol Use: No  . Drug Use: No  . Sexual Activity: Not Currently   Other Topics Concern  . 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 Nephrectomy  10/2006    right secondary to oncocytoma   . Breast biopsy  1996    benign lesion   . Cholecystectomy  10/2006  . Tubal ligation      . Right renal artery repair  1976  . Right femoral  pseudoaneurysm & right groin hematoma evacuation  02/2007  . Colonoscopy w/ biopsies    . Esophagogastroduodenoscopy    . Coronary angioplasty      Family History  Problem Relation Age of Onset  . Colitis Father   . Hypertension Mother   . Coronary artery disease Mother   . Stroke Mother   . Stroke Brother   . Colon cancer Paternal Aunt   . Clotting disorder Mother   . Clotting disorder Brother   . Clotting disorder Sister   . Heart disease Mother   . Heart disease Father   . Asthma Daughter   . Allergies Mother   . Asthma Maternal Uncle     Allergies  Allergen Reactions  . Cefdinir     Pt cannot take; may interfere with AFib.  . Codeine     Heart beats fast   . Guaifenesin Er Nausea And Vomiting and Other (See Comments)    Headaches; can tolerate liquid.  . Latex     Breathing problems  . Mucilgen [Psyllium]   .  Percocet [Oxycodone-Acetaminophen]     Itching & swelling in jaw    Current Outpatient Prescriptions on File Prior to Visit  Medication Sig Dispense Refill  . amLODipine (NORVASC) 10 MG tablet TAKE 1 TABLET (10 MG TOTAL) BY MOUTH DAILY.  30 tablet  11  . ammonium lactate (LAC-HYDRIN) 12 % lotion Apply 1 application topically daily as needed. For dry skin      . Budesonide (UCERIS) 9 MG TB24 Take 9 mg by mouth every morning.  22 tablet  0  . dofetilide (TIKOSYN) 125 MCG capsule 3 TABLETS BY MOUTH TWICE DAILY  180 capsule  5  . escitalopram (LEXAPRO) 10 MG tablet Take 1 tablet (10 mg total) by mouth daily.  30 tablet  4  . esomeprazole (NEXIUM) 40 MG capsule Take 1 capsule (40 mg total) by mouth 2 (two) times daily.  60 capsule  12  . Glucosamine-Chondroitin (GLUCOSAMINE CHONDR COMPLEX PO) Take 1 tablet by mouth at bedtime. Hold while in hospital      . loperamide (IMODIUM A-D) 2 MG tablet Take 1 mg by mouth daily as needed. Dihrrhea      . losartan (COZAAR) 100 MG tablet TAKE 1 TABLET (100 MG TOTAL) BY  MOUTH DAILY.  30 tablet  11  . mesalamine (APRISO) 0.375 G 24 hr capsule Take 4 capsules (1.5 g total) by mouth daily.      . metoprolol tartrate (LOPRESSOR) 25 MG tablet Take 1 tablet (25 mg total) by mouth 2 (two) times daily.  60 tablet  12  . Multiple Vitamin (MULTIVITAMIN) capsule Take 1 capsule by mouth daily. 1 daily        . Probiotic Product ([place]) CAPS Take 1 capsule by mouth daily. 1 per day      . XARELTO 20 MG TABS tablet TAKE 1 TABLET BY MOUTH DAILY  30 tablet  8  . nitroGLYCERIN (NITROSTAT) 0.4 MG SL tablet Place 1 tablet (0.4 mg total) under the tongue every 5 (five) minutes as needed. 1 tab under tongue every 5 min for chest pain not to exceed 3 tabs in 15 min  25 tablet  3   No current facility-administered medications on file prior to visit.    BP 142/78  Pulse 60  Temp(Src) 97.9 F (36.6 C) (Oral)  Resp 16  Ht 5' 3.25" (1.607 m)  Wt 246 lb 1.3 oz (111.621 kg)  BMI 43.22 kg/m2  SpO2 99%       Objective:   Physical Exam  Constitutional: She is oriented to person, place, and time. She appears well-developed and well-nourished. No distress.  HENT:  Head: Normocephalic and atraumatic.  Neurological: She is alert and oriented to person, place, and time.  Skin:  + erythema noted beneath right abdominal fold  Psychiatric: She has a normal mood and affect. Her behavior is normal. Judgment and thought content normal.          Assessment & Plan:

## 2013-11-11 NOTE — Patient Instructions (Signed)
Continue antifungal powder. Start diflucan. Call if symptoms worsen, or if not improved in 2-3 days.

## 2013-11-12 DIAGNOSIS — L304 Erythema intertrigo: Secondary | ICD-10-CM | POA: Insufficient documentation

## 2013-11-12 NOTE — Assessment & Plan Note (Signed)
Not responding to topical powder. Will add diflucan.  Call if symptoms worsen or if not improved in 2-3 days.

## 2013-11-26 ENCOUNTER — Other Ambulatory Visit: Payer: Self-pay | Admitting: Family Medicine

## 2013-11-26 DIAGNOSIS — L820 Inflamed seborrheic keratosis: Secondary | ICD-10-CM | POA: Diagnosis not present

## 2013-11-26 DIAGNOSIS — L905 Scar conditions and fibrosis of skin: Secondary | ICD-10-CM | POA: Diagnosis not present

## 2013-11-26 DIAGNOSIS — L538 Other specified erythematous conditions: Secondary | ICD-10-CM | POA: Diagnosis not present

## 2013-12-05 ENCOUNTER — Telehealth: Payer: Self-pay | Admitting: Internal Medicine

## 2013-12-05 MED ORDER — PREDNISONE 10 MG PO TABS: ORAL_TABLET | ORAL | Status: DC

## 2013-12-05 NOTE — Telephone Encounter (Signed)
Patient reports that she is having bloody diarrhea again.  She is having several BMs a day.  She has some lower abdominal pain.  Her last dose of Uceris was 11/22/13.  What is the next step?

## 2013-12-05 NOTE — Telephone Encounter (Signed)
Patient advised REV scheduled for 12/18/13

## 2013-12-05 NOTE — Telephone Encounter (Signed)
Prednisone 10 mg tabs 4 daily for 3 days then 3 daily for 3 days then 2 daily until seen  # 100 no refills  REV me or APP  1-2 weeks

## 2013-12-17 ENCOUNTER — Other Ambulatory Visit: Payer: Self-pay | Admitting: Internal Medicine

## 2013-12-18 ENCOUNTER — Ambulatory Visit: Payer: Medicare Other | Admitting: Internal Medicine

## 2014-01-06 ENCOUNTER — Encounter: Payer: Self-pay | Admitting: Cardiology

## 2014-01-06 ENCOUNTER — Ambulatory Visit (INDEPENDENT_AMBULATORY_CARE_PROVIDER_SITE_OTHER): Payer: Medicare Other | Admitting: Cardiology

## 2014-01-06 VITALS — BP 132/78 | HR 57 | Ht 63.0 in | Wt 253.0 lb

## 2014-01-06 DIAGNOSIS — I4891 Unspecified atrial fibrillation: Secondary | ICD-10-CM | POA: Diagnosis not present

## 2014-01-06 DIAGNOSIS — R0989 Other specified symptoms and signs involving the circulatory and respiratory systems: Secondary | ICD-10-CM | POA: Diagnosis not present

## 2014-01-06 DIAGNOSIS — I10 Essential (primary) hypertension: Secondary | ICD-10-CM

## 2014-01-06 DIAGNOSIS — I2510 Atherosclerotic heart disease of native coronary artery without angina pectoris: Secondary | ICD-10-CM

## 2014-01-06 DIAGNOSIS — E785 Hyperlipidemia, unspecified: Secondary | ICD-10-CM

## 2014-01-06 LAB — BASIC METABOLIC PANEL
BUN: 16 mg/dL (ref 6–23)
CALCIUM: 9.3 mg/dL (ref 8.4–10.5)
CO2: 30 meq/L (ref 19–32)
CREATININE: 1.2 mg/dL (ref 0.4–1.2)
Chloride: 106 mEq/L (ref 96–112)
GFR: 47.43 mL/min — ABNORMAL LOW (ref >60.00)
Glucose, Bld: 109 mg/dL — ABNORMAL HIGH (ref 70–99)
Potassium: 4.4 mEq/L (ref 3.5–5.1)
SODIUM: 142 meq/L (ref 135–145)

## 2014-01-06 LAB — MAGNESIUM: MAGNESIUM: 2.1 mg/dL (ref 1.5–2.5)

## 2014-01-06 NOTE — Progress Notes (Signed)
HPI: Pleasant female with past medical history of coronary artery disease and atrial fibrillation for followup. Last cardiac catheterization on Mar 06, 2007 showed an ejection fraction of 55%. There is nonobstructive plaque in the LAD, and circumflex and the right coronary artery was normal. 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 Also note the patient is extremely sensitive to Coumadin. Carotid Dopplers in February of 2011 showed 0-39% stenosis bilaterally. MRA of the abdomen in September of 2012 showed no left renal artery stenosis and previous right nephrectomy. 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 Echocardiogram in June of 2014 showed normal LV function, grade 1 diastolic dysfunction and mild left atrial enlargement. I last saw her in Sept 2014. Since then, she has some dyspnea on exertion but no orthopnea or PND. No chest pain and no recurrent atrial fibrillation.  Current Outpatient Prescriptions  Medication Sig Dispense Refill  . amLODipine (NORVASC) 10 MG tablet TAKE 1 TABLET (10 MG TOTAL) BY MOUTH DAILY.  30 tablet  11  . ammonium lactate (LAC-HYDRIN) 12 % lotion Apply 1 application topically daily as needed. For dry skin      . APRISO 0.375 G 24 hr capsule TAKE 4 CAPSULES BY MOUTH EVERY DAY  120 capsule  3  . Budesonide (UCERIS) 9 MG TB24 Take 9 mg by mouth every morning.  22 tablet  0  . dofetilide (TIKOSYN) 125 MCG capsule 3 TABLETS BY MOUTH TWICE DAILY  180 capsule  5  . escitalopram (LEXAPRO) 10 MG tablet Take 1 tablet (10 mg total) by mouth daily.  30 tablet  4  . escitalopram (LEXAPRO) 10 MG tablet TAKE 1 TABLET (10 MG TOTAL) BY MOUTH DAILY.  30 tablet  2  . esomeprazole (NEXIUM) 40 MG capsule Take 1 capsule (40 mg total) by mouth 2 (two) times daily.  60 capsule  12  . fluconazole (DIFLUCAN) 150 MG tablet One tablet by mouth today, may repeat in 1 week  as needed.  2 tablet  0  . Glucosamine-Chondroitin (GLUCOSAMINE CHONDR COMPLEX PO) Take 1 tablet by mouth at bedtime. Hold while in hospital      . loperamide (IMODIUM A-D) 2 MG tablet Take 1 mg by mouth daily as needed. Dihrrhea      . losartan (COZAAR) 100 MG tablet TAKE 1 TABLET (100 MG TOTAL) BY MOUTH DAILY.  30 tablet  11  . mesalamine (APRISO) 0.375 G 24 hr capsule Take 4 capsules (1.5 g total) by mouth daily.      . metoprolol tartrate (LOPRESSOR) 25 MG tablet Take 1 tablet (25 mg total) by mouth 2 (two) times daily.  60 tablet  12  . Multiple Vitamin (MULTIVITAMIN) capsule Take 1 capsule by mouth daily. 1 daily        . nitroGLYCERIN (NITROSTAT) 0.4 MG SL tablet Place 1 tablet (0.4 mg total) under the tongue every 5 (five) minutes as needed. 1 tab under tongue every 5 min for chest pain not to exceed 3 tabs in 15 min  25 tablet  3  . nystatin-triamcinolone (MYCOLOG II) cream       . predniSONE (DELTASONE) 10 MG tablet Take 40 mg po for 3 days, 30 mg po for 3 days, then 20 mg daily  100 tablet  0  . Probiotic Product ([place]) CAPS Take 1 capsule by mouth daily. 1 per day      .  XARELTO 20 MG TABS tablet TAKE 1 TABLET BY MOUTH DAILY  30 tablet  8   No current facility-administered medications for this visit.     Past Medical History  Diagnosis Date  . CAD (coronary artery disease)   . Depression   . GERD (gastroesophageal reflux disease)   . Hyperlipidemia   . Hypertension   . Crohn's colitis     she reports ulcerative colitis beginning in her 61's, biopsies 2008 suggest Crohn's not UC  . Pseudoaneurysm of right femoral artery   . Pancreatitis   . Chronic sinusitis   . Renal oncocytoma     s/p right nephrectomy  . Myocardial infarction   . Uveitis   . Anxiety   . Obesity   . Chronic cough   . OSA (obstructive sleep apnea)   . Paroxysmal atrial fibrillation   . Arthritis   . IBS (irritable bowel syndrome)   . Blood transfusion without reported diagnosis  1976  . Sleep apnea   . Keloid of skin     Past Surgical History  Procedure Laterality Date  . Nephrectomy  10/2006    right secondary to oncocytoma   . Breast biopsy  1996    benign lesion   . Cholecystectomy  10/2006  . Tubal ligation    . Right renal artery repair  1976  . Right femoral  pseudoaneurysm & right groin hematoma evacuation  02/2007  . Colonoscopy w/ biopsies    . Esophagogastroduodenoscopy    . Coronary angioplasty      History   Social History  . Marital Status: Married    Spouse Name: N/A    Number of Children: 3  . Years of Education: N/A   Occupational History  .      retired   Social History Main Topics  . Smoking status: Never Smoker   . Smokeless tobacco: Never Used  . Alcohol Use: No  . Drug Use: No  . Sexual Activity: Not Currently   Other Topics Concern  . 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 Remaining systems are negative.  Physical Exam: Well-developed obese in no acute distress.  Skin is warm and dry.  HEENT is normal.  Neck is supple.  Chest is clear to auscultation with normal expansion.  Cardiovascular exam is regular rate and rhythm.  Abdominal exam nontender or distended. No masses palpated. Extremities show trace edema. neuro grossly intact  ECG sinus rhythm at a rate of 57. No ST changes.

## 2014-01-06 NOTE — Patient Instructions (Addendum)
Your physician wants you to follow-up in: 6 MONTHS WITH DR CRENSHAW You will receive a reminder letter in the mail two months in advance. If you don't receive a letter, please call our office to schedule the follow-up appointment.   Your physician recommends that you HAVE LAB WORK TODAY  Your physician has requested that you have a carotid duplex. This test is an ultrasound of the carotid arteries in your neck. It looks at blood flow through these arteries that supply the brain with blood. Allow one hour for this exam. There are no restrictions or special instructions.   

## 2014-01-06 NOTE — Assessment & Plan Note (Signed)
Blood pressure controlled. Continue present medications. 

## 2014-01-06 NOTE — Assessment & Plan Note (Signed)
Right carotid bruit noted. Repeat carotid Dopplers.

## 2014-01-06 NOTE — Assessment & Plan Note (Signed)
Intolerant to statins. 

## 2014-01-06 NOTE — Assessment & Plan Note (Signed)
Not on aspirin given need for anticoagulation. Intolerant to statins. 

## 2014-01-06 NOTE — Assessment & Plan Note (Signed)
Patient remains in sinus. Continue beta blocker. Continue tikosyn and Nutritional therapist. Check magnesium, renal function and potassium.

## 2014-01-07 ENCOUNTER — Other Ambulatory Visit: Payer: Self-pay

## 2014-01-07 DIAGNOSIS — K219 Gastro-esophageal reflux disease without esophagitis: Secondary | ICD-10-CM

## 2014-01-07 MED ORDER — ESOMEPRAZOLE MAGNESIUM 40 MG PO CPDR: 40.0000 mg | DELAYED_RELEASE_CAPSULE | Freq: Two times a day (BID) | ORAL | Status: DC

## 2014-01-09 DIAGNOSIS — H547 Unspecified visual loss: Secondary | ICD-10-CM | POA: Diagnosis not present

## 2014-01-09 DIAGNOSIS — H35349 Macular cyst, hole, or pseudohole, unspecified eye: Secondary | ICD-10-CM | POA: Diagnosis not present

## 2014-01-13 ENCOUNTER — Ambulatory Visit (HOSPITAL_COMMUNITY): Payer: Medicare Other | Attending: Cardiology | Admitting: Cardiology

## 2014-01-13 ENCOUNTER — Encounter: Payer: Self-pay | Admitting: Cardiology

## 2014-01-13 DIAGNOSIS — R0989 Other specified symptoms and signs involving the circulatory and respiratory systems: Secondary | ICD-10-CM

## 2014-01-13 DIAGNOSIS — I6529 Occlusion and stenosis of unspecified carotid artery: Secondary | ICD-10-CM | POA: Diagnosis not present

## 2014-01-13 NOTE — Progress Notes (Signed)
Carotid duplex complete 

## 2014-01-14 DIAGNOSIS — H35379 Puckering of macula, unspecified eye: Secondary | ICD-10-CM | POA: Diagnosis not present

## 2014-01-22 ENCOUNTER — Other Ambulatory Visit: Payer: Self-pay

## 2014-01-22 MED ORDER — DOFETILIDE 125 MCG PO CAPS: ORAL_CAPSULE | ORAL | Status: DC

## 2014-02-04 ENCOUNTER — Ambulatory Visit (INDEPENDENT_AMBULATORY_CARE_PROVIDER_SITE_OTHER): Payer: Medicare Other | Admitting: Internal Medicine

## 2014-02-04 ENCOUNTER — Encounter: Payer: Self-pay | Admitting: Internal Medicine

## 2014-02-04 VITALS — BP 122/68 | HR 68 | Ht 63.0 in | Wt 253.2 lb

## 2014-02-04 DIAGNOSIS — I2510 Atherosclerotic heart disease of native coronary artery without angina pectoris: Secondary | ICD-10-CM | POA: Diagnosis not present

## 2014-02-04 DIAGNOSIS — K5010 Crohn's disease of large intestine without complications: Secondary | ICD-10-CM

## 2014-02-04 NOTE — Patient Instructions (Signed)
Come back and see [place] as needed, otherwise we will see you in November for your colonoscopy.   I appreciate the opportunity to care for you.

## 2014-02-04 NOTE — Assessment & Plan Note (Signed)
Recent flare short-lived and responded to short-term Uceris Would not take prednisone due to fear of triggering A fib. To see me in Nov or so for colonoscopy sooner prn

## 2014-02-04 NOTE — Progress Notes (Signed)
         Subjective:    Patient ID: [name], female    DOB: [birth year], 68 y.o.   MRN: [id number]  HPI The patient is here for f/u. She was seen 2 months ago - was ok, then had diarrhea and rectal bleeding. She was Rxed prednisone but found Uceris samples and took those instead. Infrequent loose stools now but no bleeding and is ok she says.  Medications, allergies, past medical history, past surgical history, family history and social history are reviewed and updated in the EMR.   Review of Systems As above - + dry skin    Objective:   Physical Exam Obese, NAD    Assessment & Plan:  Crohn's colitis Recent flare short-lived and responded to short-term Uceris Would not take prednisone due to fear of triggering A fib. To see me in Nov or so for colonoscopy sooner prn

## 2014-02-17 ENCOUNTER — Other Ambulatory Visit: Payer: Self-pay

## 2014-02-17 MED ORDER — ESCITALOPRAM OXALATE 10 MG PO TABS: 10.0000 mg | ORAL_TABLET | Freq: Every day | ORAL | Status: DC

## 2014-03-03 ENCOUNTER — Telehealth: Payer: Self-pay | Admitting: Internal Medicine

## 2014-03-03 NOTE — Telephone Encounter (Signed)
Apriso could cause but she has been on for some time so i doubt She should see PCP about this

## 2014-03-03 NOTE — Telephone Encounter (Signed)
Left message for patient to call back  

## 2014-03-03 NOTE — Telephone Encounter (Signed)
Patient reports that she is having terrible joint pain.  Her knees are the worse. Symptoms started about 3 weeks ago.  She never started on prednisone.. She has completed all Uceris samples.   She reports that her crohn's symptoms have improved.  Please advise what she can take for joint symptoms or if she needs referral?

## 2014-03-04 NOTE — Telephone Encounter (Signed)
Patient notified of Dr. Celesta Aver recommendations

## 2014-03-04 NOTE — Telephone Encounter (Signed)
Left message for patient to call back  

## 2014-03-06 ENCOUNTER — Ambulatory Visit: Payer: Medicare Other | Admitting: Physician Assistant

## 2014-03-25 ENCOUNTER — Telehealth: Payer: Self-pay | Admitting: Family Medicine

## 2014-03-25 MED ORDER — ESCITALOPRAM OXALATE 10 MG PO TABS: 10.0000 mg | ORAL_TABLET | Freq: Every day | ORAL | Status: DC

## 2014-03-25 NOTE — Telephone Encounter (Signed)
Refill- escitalopram  Requesting a 90 day supply  [facility] [id number] in [place]

## 2014-03-26 ENCOUNTER — Other Ambulatory Visit: Payer: Self-pay

## 2014-03-26 DIAGNOSIS — K219 Gastro-esophageal reflux disease without esophagitis: Secondary | ICD-10-CM

## 2014-03-26 MED ORDER — ESOMEPRAZOLE MAGNESIUM 40 MG PO CPDR: 40.0000 mg | DELAYED_RELEASE_CAPSULE | Freq: Two times a day (BID) | ORAL | Status: DC

## 2014-03-27 ENCOUNTER — Ambulatory Visit: Payer: Medicare Other | Admitting: Family Medicine

## 2014-04-02 ENCOUNTER — Other Ambulatory Visit: Payer: Self-pay

## 2014-04-02 MED ORDER — AMLODIPINE BESYLATE 10 MG PO TABS: ORAL_TABLET | ORAL | Status: DC

## 2014-04-03 ENCOUNTER — Ambulatory Visit (INDEPENDENT_AMBULATORY_CARE_PROVIDER_SITE_OTHER): Payer: Medicare Other | Admitting: Pulmonary Disease

## 2014-04-03 ENCOUNTER — Encounter: Payer: Self-pay | Admitting: Pulmonary Disease

## 2014-04-03 VITALS — BP 122/78 | HR 55 | Temp 97.7°F | Ht 63.0 in | Wt 254.0 lb

## 2014-04-03 DIAGNOSIS — I2510 Atherosclerotic heart disease of native coronary artery without angina pectoris: Secondary | ICD-10-CM

## 2014-04-03 DIAGNOSIS — G4733 Obstructive sleep apnea (adult) (pediatric): Secondary | ICD-10-CM

## 2014-04-03 NOTE — Patient Instructions (Signed)
Continue with cpap, and keep up with mask changes and supplies. Work on weight loss followup with me again in one year.  

## 2014-04-03 NOTE — Assessment & Plan Note (Signed)
The patient is doing well from a sleep apnea standpoint. She has gotten a new CPAP machine, and is having no issues with her mask or pressure. She feels that she is sleeping adequately, and is satisfied with her daytime alertness. Her weight has increased since the last visit, and I have asked her to work on this aggressively.

## 2014-04-03 NOTE — Progress Notes (Signed)
   Subjective:    Patient ID: [name], female    DOB: 05/05/1946, 68 y.o.   MRN: [id number]  HPI The patient comes in today for followup of her obstructive sleep apnea. She is wearing CPAP compliantly, and has no issues with her sleep or daytime alertness. Her mask is fitting well, and there is no pressure tolerance issue. Of note, her weight is up about 11 pounds from the last visit.   Review of Systems  Constitutional: Negative for fever and unexpected weight change.  HENT: Negative for congestion, dental problem, ear pain, nosebleeds, postnasal drip, rhinorrhea, sinus pressure, sneezing, sore throat and trouble swallowing.   Eyes: Negative for redness and itching.  Respiratory: Negative for cough, chest tightness, shortness of breath and wheezing.   Cardiovascular: Negative for palpitations and leg swelling.  Gastrointestinal: Negative for nausea and vomiting.  Genitourinary: Negative for dysuria.  Musculoskeletal: Negative for joint swelling.  Skin: Negative for rash.  Neurological: Negative for headaches.  Hematological: Does not bruise/bleed easily.  Psychiatric/Behavioral: Negative for dysphoric mood. The patient is not nervous/anxious.        Objective:   Physical Exam Obese female in no acute distress Nose without purulence or discharge noted No skin breakdown or pressure necrosis from the CPAP Neck without lymphadenopathy or thyromegaly Lower extremities with edema noted, no cyanosis Alert and oriented, moves all 4 extremities.       Assessment & Plan:

## 2014-04-19 ENCOUNTER — Other Ambulatory Visit: Payer: Self-pay | Admitting: Internal Medicine

## 2014-04-21 ENCOUNTER — Ambulatory Visit (INDEPENDENT_AMBULATORY_CARE_PROVIDER_SITE_OTHER): Payer: Medicare Other | Admitting: Family Medicine

## 2014-04-21 ENCOUNTER — Other Ambulatory Visit: Payer: Self-pay | Admitting: Family Medicine

## 2014-04-21 ENCOUNTER — Encounter: Payer: Self-pay | Admitting: Family Medicine

## 2014-04-21 VITALS — BP 118/82 | HR 83 | Temp 98.3°F | Ht 63.0 in | Wt 251.1 lb

## 2014-04-21 DIAGNOSIS — R768 Other specified abnormal immunological findings in serum: Secondary | ICD-10-CM

## 2014-04-21 DIAGNOSIS — M2550 Pain in unspecified joint: Secondary | ICD-10-CM

## 2014-04-21 DIAGNOSIS — I10 Essential (primary) hypertension: Secondary | ICD-10-CM

## 2014-04-21 DIAGNOSIS — R109 Unspecified abdominal pain: Secondary | ICD-10-CM | POA: Insufficient documentation

## 2014-04-21 DIAGNOSIS — I2510 Atherosclerotic heart disease of native coronary artery without angina pectoris: Secondary | ICD-10-CM

## 2014-04-21 DIAGNOSIS — L708 Other acne: Secondary | ICD-10-CM | POA: Diagnosis not present

## 2014-04-21 DIAGNOSIS — K50119 Crohn's disease of large intestine with unspecified complications: Secondary | ICD-10-CM

## 2014-04-21 DIAGNOSIS — M25559 Pain in unspecified hip: Secondary | ICD-10-CM

## 2014-04-21 DIAGNOSIS — G4733 Obstructive sleep apnea (adult) (pediatric): Secondary | ICD-10-CM

## 2014-04-21 DIAGNOSIS — K5010 Crohn's disease of large intestine without complications: Secondary | ICD-10-CM

## 2014-04-21 DIAGNOSIS — I482 Chronic atrial fibrillation, unspecified: Secondary | ICD-10-CM

## 2014-04-21 DIAGNOSIS — I4891 Unspecified atrial fibrillation: Secondary | ICD-10-CM

## 2014-04-21 DIAGNOSIS — M25511 Pain in right shoulder: Secondary | ICD-10-CM | POA: Insufficient documentation

## 2014-04-21 LAB — RENAL FUNCTION PANEL
Albumin: 3.6 g/dL (ref 3.5–5.2)
BUN: 22 mg/dL (ref 6–23)
CALCIUM: 9.1 mg/dL (ref 8.4–10.5)
CHLORIDE: 105 meq/L (ref 96–112)
CO2: 24 mEq/L (ref 19–32)
Creat: 1 mg/dL (ref 0.50–1.10)
Glucose, Bld: 93 mg/dL (ref 70–99)
PHOSPHORUS: 3.7 mg/dL (ref 2.3–4.6)
Potassium: 4.3 mEq/L (ref 3.5–5.3)
SODIUM: 140 meq/L (ref 135–145)

## 2014-04-21 LAB — SEDIMENTATION RATE: Sed Rate: 28 mm/hr — ABNORMAL HIGH (ref 0–22)

## 2014-04-21 LAB — CBC
HCT: 40.6 % (ref 36.0–46.0)
Hemoglobin: 13.6 g/dL (ref 12.0–15.0)
MCH: 30.2 pg (ref 26.0–34.0)
MCHC: 33.5 g/dL (ref 30.0–36.0)
MCV: 90.2 fL (ref 78.0–100.0)
Platelets: 249 10*3/uL (ref 150–400)
RBC: 4.5 MIL/uL (ref 3.87–5.11)
RDW: 13.8 % (ref 11.5–15.5)
WBC: 9.2 10*3/uL (ref 4.0–10.5)

## 2014-04-21 LAB — URIC ACID: Uric Acid, Serum: 7.8 mg/dL — ABNORMAL HIGH (ref 2.4–7.0)

## 2014-04-21 LAB — RHEUMATOID FACTOR: Rhuematoid fact SerPl-aCnc: 68 IU/mL — ABNORMAL HIGH (ref <=14)

## 2014-04-21 MED ORDER — CLINDAMYCIN PHOS-BENZOYL PEROX 1-5 % EX GEL: Freq: Two times a day (BID) | CUTANEOUS | Status: DC

## 2014-04-21 NOTE — Assessment & Plan Note (Signed)
Pelvic, check UA anc C&S.

## 2014-04-21 NOTE — Progress Notes (Signed)
Pre visit review using our clinic review tool, if applicable. No additional management support is needed unless otherwise documented below in the visit note. 

## 2014-04-21 NOTE — Assessment & Plan Note (Signed)
Try Benzaclin and Witch Hazel alon line of mask for cpap. Also bug bite on neck resolving may use there as well

## 2014-04-21 NOTE — Patient Instructions (Addendum)
Benzoyl Peroxide like Oxy 10 or Clear and Blue   Hypertension Hypertension, commonly called high blood pressure, is when the force of blood pumping through your arteries is too strong. Your arteries are the blood vessels that carry blood from your heart throughout your body. A blood pressure reading consists of a higher number over a lower number, such as 110/72. The higher number (systolic) is the pressure inside your arteries when your heart pumps. The lower number (diastolic) is the pressure inside your arteries when your heart relaxes. Ideally you want your blood pressure below 120/80. Hypertension forces your heart to work harder to pump blood. Your arteries may become narrow or stiff. Having hypertension puts you at risk for heart disease, stroke, and other problems.  RISK FACTORS Some risk factors for high blood pressure are controllable. Others are not.  Risk factors you cannot control include:   Race. You may be at higher risk if you are African American.  Age. Risk increases with age.  Gender. Men are at higher risk than women before age 3 years. After age 88, women are at higher risk than men. Risk factors you can control include:  Not getting enough exercise or physical activity.  Being overweight.  Getting too much fat, sugar, calories, or salt in your diet.  Drinking too much alcohol. SIGNS AND SYMPTOMS Hypertension does not usually cause signs or symptoms. Extremely high blood pressure (hypertensive crisis) may cause headache, anxiety, shortness of breath, and nosebleed. DIAGNOSIS  To check if you have hypertension, your health care provider will measure your blood pressure while you are seated, with your arm held at the level of your heart. It should be measured at least twice using the same arm. Certain conditions can cause a difference in blood pressure between your right and left arms. A blood pressure reading that is higher than normal on one occasion does not mean  that you need treatment. If one blood pressure reading is high, ask your health care provider about having it checked again. TREATMENT  Treating high blood pressure includes making lifestyle changes and possibly taking medication. Living a healthy lifestyle can help lower high blood pressure. You may need to change some of your habits. Lifestyle changes may include:  Following the DASH diet. This diet is high in fruits, vegetables, and whole grains. It is low in salt, red meat, and added sugars.  Getting at least 2 1/2 hours of brisk physical activity every week.  Losing weight if necessary.  Not smoking.  Limiting alcoholic beverages.  Learning ways to reduce stress. If lifestyle changes are not enough to get your blood pressure under control, your health care provider may prescribe medicine. You may need to take more than one. Work closely with your health care provider to understand the risks and benefits. HOME CARE INSTRUCTIONS  Have your blood pressure rechecked as directed by your health care provider.   Only take medicine as directed by your health care provider. Follow the directions carefully. Blood pressure medicines must be taken as prescribed. The medicine does not work as well when you skip doses. Skipping doses also puts you at risk for problems.   Do not smoke.   Monitor your blood pressure at home as directed by your health care provider. SEEK MEDICAL CARE IF:   You think you are having a reaction to medicines taken.  You have recurrent headaches or feel dizzy.  You have swelling in your ankles.  You have trouble with your vision. SEEK  IMMEDIATE MEDICAL CARE IF:  You develop a severe headache or confusion.  You have unusual weakness, numbness, or feel faint.  You have severe chest or abdominal pain.  You vomit repeatedly.  You have trouble breathing. MAKE SURE YOU:   Understand these instructions.  Will watch your condition.  Will get help  right away if you are not doing well or get worse. Document Released: 10/10/2005 Document Revised: 10/15/2013 Document Reviewed: 08/02/2013 [facility] Patient Information 2015 [place], [place]. This information is not intended to replace advice given to you by your health care provider. Make sure you discuss any questions you have with your health care provider.

## 2014-04-21 NOTE — Assessment & Plan Note (Signed)
Diffuse without swelling, redness, now resolved. Call if returns check labs

## 2014-04-21 NOTE — Assessment & Plan Note (Signed)
Recent flare required extra Aprisio dosing. Had arthralgias after that. Crohn's now doing better

## 2014-04-21 NOTE — Assessment & Plan Note (Signed)
Well controlled, no changes to meds. Encouraged heart healthy diet such as the DASH diet and exercise as tolerated.  °

## 2014-04-21 NOTE — Progress Notes (Signed)
Patient ID: [name], female   DOB: February 15, 1946, 68 y.o.   MRN: [id number] [name] [id number] 1946-08-22 04/21/2014      Progress Note-Follow Up  Subjective  Chief Complaint  Chief Complaint  Patient presents with  . Arthritis    HPI  Patient is a 68 year old female in today for routine medical care. She is in today to discuss numerous concerns. She had a 2 week history of diffuse joint pain large and small joints in the first 2 weeks of June. They have resolved now. She denies any redness or swelling when that happened. At present she is complaining just of the PIP joint on her left fourth finger being painful and slightly swollen. She does note for her arthralgias began she had a Crohn's flare. He had increased her dosing of medication temporarily. She's also noting a bug bite on the left side of her neck which is becoming smaller but was painful and itchy. She's also complaining of acne or sores on her face around where her CPAP mask 6. No other acute complaints. Denies CP/palp/SOB/HA/congestion/fevers/GI or GU c/o. Taking meds as prescribed  Past Medical History  Diagnosis Date  . CAD (coronary artery disease)   . Depression   . GERD (gastroesophageal reflux disease)   . Hyperlipidemia   . Hypertension   . Crohn's colitis     she reports ulcerative colitis beginning in her 70's, biopsies 2008 suggest Crohn's not UC  . Pseudoaneurysm of right femoral artery   . Pancreatitis   . Chronic sinusitis   . Renal oncocytoma     s/p right nephrectomy  . Myocardial infarction   . Uveitis   . Anxiety   . Obesity   . Chronic cough   . OSA (obstructive sleep apnea)   . Paroxysmal atrial fibrillation   . Arthritis   . IBS (irritable bowel syndrome)   . Blood transfusion without reported diagnosis 1976  . Sleep apnea   . Keloid of skin     Past Surgical History  Procedure Laterality Date  . Nephrectomy  10/2006    right secondary to oncocytoma   . Breast biopsy   1996    benign lesion   . Cholecystectomy  10/2006  . Tubal ligation    . Right renal artery repair  1976  . Right femoral  pseudoaneurysm & right groin hematoma evacuation  02/2007  . Colonoscopy w/ biopsies    . Esophagogastroduodenoscopy    . Coronary angioplasty      Family History  Problem Relation Age of Onset  . Colitis Father   . Hypertension Mother   . Coronary artery disease Mother   . Stroke Mother   . Stroke Brother   . Colon cancer Paternal Aunt   . Clotting disorder Mother   . Clotting disorder Brother   . Clotting disorder Sister   . Heart disease Mother   . Heart disease Father   . Asthma Daughter   . Allergies Mother   . Asthma Maternal Uncle     History   Social History  . Marital Status: Married    Spouse Name: N/A    Number of Children: 3  . Years of Education: N/A   Occupational History  .      retired   Social History Main Topics  . Smoking status: Never Smoker   . Smokeless tobacco: Never Used  . Alcohol Use: No  . Drug Use: No  . Sexual Activity: Not Currently  Other Topics Concern  . 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 amLODipine (NORVASC) 10 MG tablet TAKE 1 TABLET (10 MG TOTAL) BY MOUTH DAILY.  30 tablet  6  . ammonium lactate (LAC-HYDRIN) 12 % lotion Apply 1 application topically daily as needed. For dry skin      . APRISO 0.375 G 24 hr capsule TAKE 4 CAPSULES BY MOUTH EVERY DAY  120 capsule  3  . dofetilide (TIKOSYN) 125 MCG capsule 3 TABLETS BY MOUTH TWICE DAILY  180 capsule  5  . escitalopram (LEXAPRO) 10 MG tablet Take 1 tablet (10 mg total) by mouth daily.  30 tablet  1  . esomeprazole (NEXIUM) 40 MG capsule Take 1 capsule (40 mg total) by mouth 2 (two) times daily.  180 capsule  2  .  Glucosamine-Chondroitin (GLUCOSAMINE CHONDR COMPLEX PO) Take 1 tablet by mouth at bedtime. Hold while in hospital      . loperamide (IMODIUM A-D) 2 MG tablet Take 1 mg by mouth daily as needed. Dihrrhea      . losartan (COZAAR) 100 MG tablet TAKE 1 TABLET (100 MG TOTAL) BY MOUTH DAILY.  30 tablet  11  . metoprolol tartrate (LOPRESSOR) 25 MG tablet Take 1 tablet (25 mg total) by mouth 2 (two) times daily.  60 tablet  12  . Multiple Vitamin (MULTIVITAMIN) capsule Take 1 capsule by mouth daily. 1 daily        . nitroGLYCERIN (NITROSTAT) 0.4 MG SL tablet Place 1 tablet (0.4 mg total) under the tongue every 5 (five) minutes as needed. 1 tab under tongue every 5 min for chest pain not to exceed 3 tabs in 15 min  25 tablet  3  . Probiotic Product ([place]) CAPS Take 1 capsule by mouth daily. 1 per day      . XARELTO 20 MG TABS tablet TAKE 1 TABLET BY MOUTH DAILY  30 tablet  8   No current facility-administered medications on file prior to visit.    Allergies  Allergen Reactions  . Cefdinir     Pt cannot take; may interfere with AFib.  . Codeine     Heart beats fast   . Guaifenesin Er Nausea And Vomiting and Other (See Comments)    Headaches; can tolerate liquid.  . Latex     Breathing problems  . Mucilgen [Psyllium]   . Percocet [Oxycodone-Acetaminophen]     Itching & swelling in jaw    Review of Systems  Review of Systems  Constitutional: Negative for fever and malaise/fatigue.  HENT: Negative for congestion.   Eyes: Negative for discharge.  Respiratory: Negative for shortness of breath.   Cardiovascular: Negative for chest pain, palpitations and leg swelling.  Gastrointestinal: Positive for abdominal pain and diarrhea. Negative for nausea.  Genitourinary: Positive for frequency. Negative for dysuria.  Musculoskeletal: Positive for joint pain. Negative for falls.  Skin: Negative for rash.  Neurological: Negative for loss of consciousness and headaches.   Endo/Heme/Allergies: Negative for polydipsia.  Psychiatric/Behavioral: Negative for depression and suicidal ideas. The patient is not nervous/anxious and does not have insomnia.     Objective  BP 118/82  Pulse 83  Temp(Src) 98.3 F (36.8 C) (Oral)  Ht 5\' 3"  (  1.6 m)  Wt 251 lb 1.3 oz (113.889 kg)  BMI 44.49 kg/m2  SpO2 90%  Physical Exam  Physical Exam  Constitutional: She is oriented to person, place, and time and well-developed, well-nourished, and in no distress. No distress.  HENT:  Head: Normocephalic and atraumatic.  Eyes: Conjunctivae are normal.  Neck: Neck supple. No thyromegaly present.  Cardiovascular: Normal rate and normal heart sounds.   irregular  Pulmonary/Chest: Effort normal and breath sounds normal. She has no wheezes.  Abdominal: She exhibits no distension and no mass.  Musculoskeletal: She exhibits no edema.  Lymphadenopathy:    She has no cervical adenopathy.  Neurological: She is alert and oriented to person, place, and time.  Skin: Skin is warm and dry. No rash noted. She is not diaphoretic.  Psychiatric: Memory, affect and judgment normal.    Lab Results  Component Value Date   TSH 3.075 02/28/2013   Lab Results  Component Value Date   WBC 9.1 10/14/2013   HGB 13.6 10/14/2013   HCT 40.7 10/14/2013   MCV 91.8 10/14/2013   PLT 250.0 10/14/2013   Lab Results  Component Value Date   CREATININE 1.2 01/06/2014   BUN 16 01/06/2014   NA 142 01/06/2014   K 4.4 01/06/2014   CL 106 01/06/2014   CO2 30 01/06/2014   Lab Results  Component Value Date   ALT 18 10/14/2013   AST 22 10/14/2013   ALKPHOS 73 10/14/2013   BILITOT 0.5 10/14/2013   Lab Results  Component Value Date   CHOL 182 02/28/2013   Lab Results  Component Value Date   HDL 38* 02/28/2013   Lab Results  Component Value Date   LDLCALC 113* 02/28/2013   Lab Results  Component Value Date   TRIG 154* 02/28/2013   Lab Results  Component Value Date   CHOLHDL 4.8 02/28/2013      Assessment & Plan  ATRIAL FIBRILLATION Rate controlled, tolerating Xarelto  HYPERTENSION Well controlled, no changes to meds. Encouraged heart healthy diet such as the DASH diet and exercise as tolerated.   Crohn's colitis Recent flare required extra Aprisio dosing. Had arthralgias after that. Crohn's now doing better  OSA (obstructive sleep apnea) Doing well, uses full face mask having some acne along mask line  Other acne Try Benzaclin and Witch Hazel alon line of mask for cpap. Also bug bite on neck resolving may use there as well  Arthralgia Diffuse without swelling, redness, now resolved. Call if returns check labs  Abdominal pain, unspecified site Pelvic, check UA anc C&S.

## 2014-04-21 NOTE — Assessment & Plan Note (Signed)
Doing well, uses full face mask having some acne along mask line

## 2014-04-21 NOTE — Assessment & Plan Note (Signed)
Rate controlled, tolerating Xarelto

## 2014-04-22 ENCOUNTER — Telehealth: Payer: Self-pay | Admitting: Family Medicine

## 2014-04-22 LAB — URINALYSIS
Bilirubin Urine: NEGATIVE
GLUCOSE, UA: NEGATIVE mg/dL
Hgb urine dipstick: NEGATIVE
Ketones, ur: NEGATIVE mg/dL
LEUKOCYTES UA: NEGATIVE
Nitrite: NEGATIVE
PROTEIN: NEGATIVE mg/dL
SPECIFIC GRAVITY, URINE: 1.02 (ref 1.005–1.030)
UROBILINOGEN UA: 0.2 mg/dL (ref 0.0–1.0)
pH: 5.5 (ref 5.0–8.0)

## 2014-04-22 LAB — ANA: Anti Nuclear Antibody(ANA): NEGATIVE

## 2014-04-22 NOTE — Telephone Encounter (Signed)
Relevant patient education assigned to patient using Emmi. ° °

## 2014-04-23 LAB — URINE CULTURE

## 2014-04-24 ENCOUNTER — Other Ambulatory Visit: Payer: Self-pay | Admitting: Family Medicine

## 2014-04-24 DIAGNOSIS — N390 Urinary tract infection, site not specified: Secondary | ICD-10-CM

## 2014-04-24 MED ORDER — NITROFURANTOIN MONOHYD MACRO 100 MG PO CAPS: 100.0000 mg | ORAL_CAPSULE | Freq: Two times a day (BID) | ORAL | Status: DC

## 2014-04-29 ENCOUNTER — Telehealth: Payer: Self-pay | Admitting: Cardiology

## 2014-04-29 NOTE — Telephone Encounter (Signed)
Spoke with pt, she was given macrobid for bacteria in her urine and wants to make sure okay to take. Will forward for dr Stanford Breed review

## 2014-04-29 NOTE — Telephone Encounter (Signed)
Ok for [employer]

## 2014-04-29 NOTE — Telephone Encounter (Signed)
Please call,wants to talk to you about some medicine her primary doctor prescribed for her.

## 2014-04-29 NOTE — Telephone Encounter (Signed)
Forward to [employer] RN

## 2014-04-30 NOTE — Telephone Encounter (Signed)
Spoke with pt, Aware of dr crenshaw's recommendations.  °

## 2014-05-13 ENCOUNTER — Telehealth: Payer: Self-pay | Admitting: Cardiology

## 2014-05-13 NOTE — Telephone Encounter (Signed)
Closed encounter °

## 2014-05-21 ENCOUNTER — Other Ambulatory Visit: Payer: Self-pay

## 2014-05-21 MED ORDER — LOSARTAN POTASSIUM 100 MG PO TABS: ORAL_TABLET | ORAL | Status: DC

## 2014-05-30 ENCOUNTER — Telehealth: Payer: Self-pay | Admitting: Cardiology

## 2014-05-30 NOTE — Telephone Encounter (Signed)
Patient would like to change from Xarelto to something else.  Please advise.

## 2014-05-30 NOTE — Telephone Encounter (Signed)
Returned call to patient she stated she would like to stop xarelto and start on coumadin.Stated her brother died on xarelto and she prefers not to take.Dr.Crenshaw out of office will send message to him.

## 2014-05-30 NOTE — Telephone Encounter (Signed)
Ok to change from xarelto to coumadin, dc xarelto, coumadin 2.5 mg daily, inr 4 days later in coumadin c

## 2014-06-02 ENCOUNTER — Telehealth: Payer: Self-pay

## 2014-06-02 MED ORDER — WARFARIN SODIUM 2.5 MG PO TABS: ORAL_TABLET | ORAL | Status: DC

## 2014-06-02 NOTE — Telephone Encounter (Signed)
Spoke with [name] at Dr. Jacalyn Lefevre office who stated that pt wanted [place] to assume care of her coumadin. Stefan Church we prefer Cardiology to maintain her coumadin but pt is welcome to use the [facility] in the office for PT check. [name] will notify patient

## 2014-06-02 NOTE — Telephone Encounter (Signed)
[place] for primary care to follow INR Kirk Ruths

## 2014-06-02 NOTE — Telephone Encounter (Signed)
Spoke with [name], Aware of dr Jacalyn Lefevre recommendations. She would like to have her protimes followed in [place] by dr blythe. Left message for dr blyth's office to call me to discuss

## 2014-06-02 NOTE — Telephone Encounter (Signed)
Left message for pt to call.

## 2014-06-02 NOTE — Telephone Encounter (Signed)
[name] with Dr Jacalyn Lefevre office states she forgot to ask [name] a question and would like a call back

## 2014-06-02 NOTE — Telephone Encounter (Signed)
Spoke with pt, aware cardiology can not follow protimes if drawn at the [place] office. Will forward to dr blythe to see if she will agree to follow protimes and warfarin dosing for this patient. Patient aware to continue xarelto until we hear back from dr blythe Pt would also like to make sure okay with dr Stanford Breed for dr blythe to manage. Message forwarded to dr Stanford Breed and dr blythe

## 2014-06-02 NOTE — Telephone Encounter (Signed)
Try constipation

## 2014-06-03 NOTE — Telephone Encounter (Signed)
You are correct the charts must have flipped, I am willing to consider taking over the Coumadinbut for now it is still a blood draw, in the future we will likely have a coumadin clinic with fingersticks

## 2014-06-03 NOTE — Telephone Encounter (Signed)
???   I think this was entered in the wrong note

## 2014-06-05 NOTE — Telephone Encounter (Signed)
See telephone note from 06-02-14

## 2014-06-05 NOTE — Telephone Encounter (Signed)
Spoke with pt, she wants to have her INR checked with a finger prick not a blood draw. She will switch from the xarelto to warfarin 2.5 mg on Friday this week. appt made at the [facility] at [address] Monday 06-09-14. Patient voiced understanding

## 2014-06-09 ENCOUNTER — Ambulatory Visit (INDEPENDENT_AMBULATORY_CARE_PROVIDER_SITE_OTHER): Payer: Medicare Other | Admitting: Pharmacist

## 2014-06-09 DIAGNOSIS — Z5181 Encounter for therapeutic drug level monitoring: Secondary | ICD-10-CM | POA: Diagnosis not present

## 2014-06-09 LAB — POCT INR: INR: 1.1

## 2014-06-16 ENCOUNTER — Ambulatory Visit (INDEPENDENT_AMBULATORY_CARE_PROVIDER_SITE_OTHER): Payer: Medicare Other | Admitting: *Deleted

## 2014-06-16 DIAGNOSIS — Z5181 Encounter for therapeutic drug level monitoring: Secondary | ICD-10-CM | POA: Diagnosis not present

## 2014-06-16 LAB — POCT INR: INR: 2.5

## 2014-06-17 DIAGNOSIS — R5383 Other fatigue: Secondary | ICD-10-CM | POA: Diagnosis not present

## 2014-06-17 DIAGNOSIS — M2550 Pain in unspecified joint: Secondary | ICD-10-CM | POA: Diagnosis not present

## 2014-06-17 DIAGNOSIS — R894 Abnormal immunological findings in specimens from other organs, systems and tissues: Secondary | ICD-10-CM | POA: Diagnosis not present

## 2014-06-17 DIAGNOSIS — K5010 Crohn's disease of large intestine without complications: Secondary | ICD-10-CM | POA: Diagnosis not present

## 2014-06-17 DIAGNOSIS — R5381 Other malaise: Secondary | ICD-10-CM | POA: Diagnosis not present

## 2014-06-17 DIAGNOSIS — H209 Unspecified iridocyclitis: Secondary | ICD-10-CM | POA: Diagnosis not present

## 2014-06-17 LAB — HM HEPATITIS C SCREENING LAB: HM Hepatitis Screen: NEGATIVE

## 2014-06-23 ENCOUNTER — Ambulatory Visit (INDEPENDENT_AMBULATORY_CARE_PROVIDER_SITE_OTHER): Payer: Medicare Other

## 2014-06-23 DIAGNOSIS — I4891 Unspecified atrial fibrillation: Secondary | ICD-10-CM | POA: Diagnosis not present

## 2014-06-23 DIAGNOSIS — Z5181 Encounter for therapeutic drug level monitoring: Secondary | ICD-10-CM

## 2014-06-23 LAB — POCT INR: INR: 4.6

## 2014-06-25 ENCOUNTER — Telehealth: Payer: Self-pay | Admitting: Family Medicine

## 2014-06-25 ENCOUNTER — Telehealth: Payer: Self-pay | Admitting: Cardiology

## 2014-06-25 NOTE — Telephone Encounter (Signed)
°  New problem:   PCP sent pt to a Rumatolagist.   Pt wants to know whether or not she has CHF?  Please give pt a call back.

## 2014-06-25 NOTE — Telephone Encounter (Signed)
Please advise 

## 2014-06-25 NOTE — Telephone Encounter (Signed)
Can we request Dr Melissa Noon records should be able to just ask for them since we referred her.  Let her know we are requesting his records to get me up to speed. See if she will tell you what her questions are while we wait on his records and then we can talk. Thanks

## 2014-06-25 NOTE — Telephone Encounter (Signed)
Caller name: Hasana  Relation to pt: self  Call back number: [phone]   Reason for call:   the referred rheumatologist dx pt with Rheumatoid arthritis and advised remicade infusion therapy and in need of clincal advice

## 2014-06-26 NOTE — Telephone Encounter (Signed)
Yes this med does increase the risk of acute respiratory illness but we just have a low threshold for holding doses and treating with antibiotics but we do not avoid the medication for this reason. Let her know we are waiting on Dr Melissa Noon paperwork

## 2014-06-26 NOTE — Telephone Encounter (Signed)
Notified pt. She states she was diagnosed with chronic bronchitis and sinusitis as a child and has had some issues with these as an adult. Has heard that these can become worse with Remicade. Pt wants to know if you feel the benefits would outweigh the risks.  Please advise.  Left message on voicemail for Lattie Haw in medical records to fax consulation/lab notes. Awaiting records.

## 2014-06-26 NOTE — Telephone Encounter (Signed)
Spoke with pt, aware she does not have CHF.

## 2014-06-27 ENCOUNTER — Ambulatory Visit (INDEPENDENT_AMBULATORY_CARE_PROVIDER_SITE_OTHER): Payer: Medicare Other | Admitting: Family

## 2014-06-27 ENCOUNTER — Encounter: Payer: Self-pay | Admitting: Family

## 2014-06-27 VITALS — BP 126/88 | HR 57 | Temp 97.9°F | Resp 16 | Ht 63.25 in | Wt 250.8 lb

## 2014-06-27 DIAGNOSIS — J019 Acute sinusitis, unspecified: Secondary | ICD-10-CM

## 2014-06-27 DIAGNOSIS — I2510 Atherosclerotic heart disease of native coronary artery without angina pectoris: Secondary | ICD-10-CM | POA: Diagnosis not present

## 2014-06-27 DIAGNOSIS — Z23 Encounter for immunization: Secondary | ICD-10-CM | POA: Diagnosis not present

## 2014-06-27 MED ORDER — AMOXICILLIN 500 MG PO CAPS: 500.0000 mg | ORAL_CAPSULE | Freq: Three times a day (TID) | ORAL | Status: DC

## 2014-06-27 NOTE — Telephone Encounter (Signed)
perfect

## 2014-06-27 NOTE — Patient Instructions (Signed)
Start amoxicillin for sinus infection. Start flonase. Call if symptoms worsen or if not improved in 2-3 days.  Sinusitis Sinusitis is redness, soreness, and inflammation of the paranasal sinuses. Paranasal sinuses are air pockets within the bones of your face (beneath the eyes, the middle of the forehead, or above the eyes). In healthy paranasal sinuses, mucus is able to drain out, and air is able to circulate through them by way of your nose. However, when your paranasal sinuses are inflamed, mucus and air can become trapped. This can allow bacteria and other germs to grow and cause infection. Sinusitis can develop quickly and last only a short time (acute) or continue over a long period (chronic). Sinusitis that lasts for more than 12 weeks is considered chronic.  CAUSES  Causes of sinusitis include:  Allergies.  Structural abnormalities, such as displacement of the cartilage that separates your nostrils (deviated septum), which can decrease the air flow through your nose and sinuses and affect sinus drainage.  Functional abnormalities, such as when the small hairs (cilia) that line your sinuses and help remove mucus do not work properly or are not present. SIGNS AND SYMPTOMS  Symptoms of acute and chronic sinusitis are the same. The primary symptoms are pain and pressure around the affected sinuses. Other symptoms include:  Upper toothache.  Earache.  Headache.  Bad breath.  Decreased sense of smell and taste.  A cough, which worsens when you are lying flat.  Fatigue.  Fever.  Thick drainage from your nose, which often is green and may contain pus (purulent).  Swelling and warmth over the affected sinuses. DIAGNOSIS  Your health care provider will perform a physical exam. During the exam, your health care provider may:  Look in your nose for signs of abnormal growths in your nostrils (nasal polyps).  Tap over the affected sinus to check for signs of infection.  View the  inside of your sinuses (endoscopy) using an imaging device that has a light attached (endoscope). If your health care provider suspects that you have chronic sinusitis, one or more of the following tests may be recommended:  Allergy tests.  Nasal culture. A sample of mucus is taken from your nose, sent to a lab, and screened for bacteria.  Nasal cytology. A sample of mucus is taken from your nose and examined by your health care provider to determine if your sinusitis is related to an allergy. TREATMENT  Most cases of acute sinusitis are related to a viral infection and will resolve on their own within 10 days. Sometimes medicines are prescribed to help relieve symptoms (pain medicine, decongestants, nasal steroid sprays, or saline sprays).  However, for sinusitis related to a bacterial infection, your health care provider will prescribe antibiotic medicines. These are medicines that will help kill the bacteria causing the infection.  Rarely, sinusitis is caused by a fungal infection. In theses cases, your health care provider will prescribe antifungal medicine. For some cases of chronic sinusitis, surgery is needed. Generally, these are cases in which sinusitis recurs more than 3 times per year, despite other treatments. HOME CARE INSTRUCTIONS   Drink plenty of water. Water helps thin the mucus so your sinuses can drain more easily.  Use a humidifier.  Inhale steam 3 to 4 times a day (for example, sit in the bathroom with the shower running).  Apply a warm, moist washcloth to your face 3 to 4 times a day, or as directed by your health care provider.  Use saline nasal sprays  to help moisten and clean your sinuses.  Take medicines only as directed by your health care provider.  If you were prescribed either an antibiotic or antifungal medicine, finish it all even if you start to feel better. SEEK IMMEDIATE MEDICAL CARE IF:  You have increasing pain or severe headaches.  You have  nausea, vomiting, or drowsiness.  You have swelling around your face.  You have vision problems.  You have a stiff neck.  You have difficulty breathing. MAKE SURE YOU:   Understand these instructions.  Will watch your condition.  Will get help right away if you are not doing well or get worse. Document Released: 10/10/2005 Document Revised: 02/24/2014 Document Reviewed: 10/25/2011 [facility] Patient Information 2015 [place], [place]. This information is not intended to replace advice given to you by your health care provider. Make sure you discuss any questions you have with your health care provider.

## 2014-06-27 NOTE — Telephone Encounter (Signed)
Notified pt. She voices understanding and states that the glands in her neck are swollen and requests appt with Dr Charlett Blake.  Appt not available today but offered appt with other Provider. Pt requested Debbrah Alar, NP but was not able to accept available time slots. Pt scheduled appt for 07/02/14 at 10:45am and was advised to call for earlier appt if symptoms worsen or schedule changes.

## 2014-06-27 NOTE — Progress Notes (Signed)
Subjective:    Patient ID: [name], female    DOB: May 19, 1946, 68 y.o.   MRN: [id number]  HPI  [name] is a 68 yr old female who presents today with chief complaint of sinus congestion. Reports that it has been present for several months. Has associated mild discomfort with swallowing, low energy, mild blurring of vision at times (normal today) and mild headaches. She denies associated fever.  She does have hx of a maxillary sinus cyst and tells me that she has seen ENT in the past and her last visit was about 2 years ago.  Review of Systems See HPI  Past Medical History  Diagnosis Date  . CAD (coronary artery disease)   . Depression   . GERD (gastroesophageal reflux disease)   . Hyperlipidemia   . Hypertension   . Crohn's colitis     she reports ulcerative colitis beginning in her 62's, biopsies 2008 suggest Crohn's not UC  . Pseudoaneurysm of right femoral artery   . Pancreatitis   . Chronic sinusitis   . Renal oncocytoma     s/p right nephrectomy  . Myocardial infarction   . Uveitis   . Anxiety   . Obesity   . Chronic cough   . OSA (obstructive sleep apnea)   . Paroxysmal atrial fibrillation   . Arthritis   . IBS (irritable bowel syndrome)   . Blood transfusion without reported diagnosis 1976  . Sleep apnea   . Keloid of skin     History   Social History  . Marital Status: Married    Spouse Name: N/A    Number of Children: 3  . Years of Education: N/A   Occupational History  .      retired   Social History Main Topics  . Smoking status: Never Smoker   . Smokeless tobacco: Never Used  . Alcohol Use: No  . Drug Use: No  . Sexual Activity: Not Currently   Other Topics Concern  . 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 Nephrectomy  10/2006    right  secondary to oncocytoma   . Breast biopsy  1996    benign lesion   . Cholecystectomy  10/2006  . Tubal ligation    . Right renal artery repair  1976  . Right femoral  pseudoaneurysm & right groin hematoma evacuation  02/2007  . Colonoscopy w/ biopsies    . Esophagogastroduodenoscopy    . Coronary angioplasty      Family History  Problem Relation Age of Onset  . Colitis Father   . Hypertension Mother   . Coronary artery disease Mother   . Stroke Mother   . Stroke Brother   . Colon cancer Paternal Aunt   . Clotting disorder Mother   . Clotting disorder Brother   . Clotting disorder Sister   . Heart disease Mother   . Heart disease Father   . Asthma Daughter   . Allergies Mother   . Asthma Maternal Uncle     Allergies  Allergen Reactions  . Cefdinir     Pt cannot take; may interfere with AFib.  . Codeine     Heart beats fast   . Guaifenesin Er Nausea And Vomiting and Other (See Comments)  Headaches; can tolerate liquid.  . Latex     Breathing problems  . Mucilgen [Psyllium]   . Percocet [Oxycodone-Acetaminophen]     Itching & swelling in jaw    Current Outpatient Prescriptions on File Prior to Visit  Medication Sig Dispense Refill  . amLODipine (NORVASC) 10 MG tablet TAKE 1 TABLET (10 MG TOTAL) BY MOUTH DAILY.  30 tablet  6  . ammonium lactate (LAC-HYDRIN) 12 % lotion Apply 1 application topically daily as needed. For dry skin      . APRISO 0.375 G 24 hr capsule TAKE 4 CAPSULES BY MOUTH EVERY DAY  120 capsule  3  . clindamycin-benzoyl peroxide (BENZACLIN) gel Apply topically 2 (two) times daily.  50 g  1  . dofetilide (TIKOSYN) 125 MCG capsule 3 TABLETS BY MOUTH TWICE DAILY  180 capsule  5  . escitalopram (LEXAPRO) 10 MG tablet Take 1 tablet (10 mg total) by mouth daily.  30 tablet  1  . esomeprazole (NEXIUM) 40 MG capsule Take 1 capsule (40 mg total) by mouth 2 (two) times daily.  180 capsule  2  . Glucosamine-Chondroitin (GLUCOSAMINE CHONDR COMPLEX PO) Take 1  tablet by mouth at bedtime. Hold while in hospital      . loperamide (IMODIUM A-D) 2 MG tablet Take 1 mg by mouth daily as needed. Dihrrhea      . losartan (COZAAR) 100 MG tablet TAKE 1 TABLET (100 MG TOTAL) BY MOUTH DAILY.  30 tablet  6  . metoprolol tartrate (LOPRESSOR) 25 MG tablet Take 1 tablet (25 mg total) by mouth 2 (two) times daily.  60 tablet  12  . Multiple Vitamin (MULTIVITAMIN) capsule Take 1 capsule by mouth daily. 1 daily        . nitroGLYCERIN (NITROSTAT) 0.4 MG SL tablet Place 1 tablet (0.4 mg total) under the tongue every 5 (five) minutes as needed. 1 tab under tongue every 5 min for chest pain not to exceed 3 tabs in 15 min  25 tablet  3  . Probiotic Product ([place]) CAPS Take 1 capsule by mouth daily. 1 per day      . warfarin (COUMADIN) 2.5 MG tablet Take as directed  30 tablet  6   No current facility-administered medications on file prior to visit.    BP 126/88  Pulse 57  Temp(Src) 97.9 F (36.6 C) (Oral)  Resp 16  Ht 5' 3.25" (1.607 m)  Wt 250 lb 12.8 oz (113.762 kg)  BMI 44.05 kg/m2  SpO2 97%       Objective:   Physical Exam  Constitutional: She is oriented to person, place, and time. She appears well-developed and well-nourished. No distress.  HENT:  Head: Normocephalic and atraumatic.  Right Ear: Tympanic membrane and ear canal normal.  Left Ear: Tympanic membrane and ear canal normal.  Mouth/Throat: No oropharyngeal exudate, posterior oropharyngeal edema or posterior oropharyngeal erythema.  Mild frontal and maxillary sinus tenderness to palpation bilaterally  Cardiovascular: Normal rate and regular rhythm.   No murmur heard. Pulmonary/Chest: Effort normal and breath sounds normal. No respiratory distress. She has no wheezes. She has no rales. She exhibits no tenderness.  Neurological: She is alert and oriented to person, place, and time.  Psychiatric: She has a normal mood and affect. Her behavior is normal. Judgment and thought  content normal.          Assessment & Plan:

## 2014-06-27 NOTE — Progress Notes (Signed)
Pre visit review using our clinic review tool, if applicable. No additional management support is needed unless otherwise documented below in the visit note. 

## 2014-06-30 DIAGNOSIS — J329 Chronic sinusitis, unspecified: Secondary | ICD-10-CM | POA: Insufficient documentation

## 2014-06-30 NOTE — Assessment & Plan Note (Signed)
Will rx with amoxicillin.  Pt is instructed to call if symptoms worsen or if not improved in 2-3 days.

## 2014-07-01 ENCOUNTER — Ambulatory Visit (INDEPENDENT_AMBULATORY_CARE_PROVIDER_SITE_OTHER): Payer: Medicare Other | Admitting: Pharmacist

## 2014-07-01 DIAGNOSIS — Z5181 Encounter for therapeutic drug level monitoring: Secondary | ICD-10-CM

## 2014-07-01 LAB — POCT INR: INR: 2.4

## 2014-07-02 ENCOUNTER — Ambulatory Visit: Payer: Medicare Other | Admitting: Family

## 2014-07-07 DIAGNOSIS — K5010 Crohn's disease of large intestine without complications: Secondary | ICD-10-CM | POA: Diagnosis not present

## 2014-07-07 DIAGNOSIS — M069 Rheumatoid arthritis, unspecified: Secondary | ICD-10-CM | POA: Diagnosis not present

## 2014-07-07 DIAGNOSIS — M2550 Pain in unspecified joint: Secondary | ICD-10-CM | POA: Diagnosis not present

## 2014-07-07 DIAGNOSIS — R894 Abnormal immunological findings in specimens from other organs, systems and tissues: Secondary | ICD-10-CM | POA: Diagnosis not present

## 2014-07-11 ENCOUNTER — Ambulatory Visit (INDEPENDENT_AMBULATORY_CARE_PROVIDER_SITE_OTHER): Payer: Medicare Other | Admitting: Pharmacist

## 2014-07-11 DIAGNOSIS — I4891 Unspecified atrial fibrillation: Secondary | ICD-10-CM | POA: Diagnosis not present

## 2014-07-11 DIAGNOSIS — Z5181 Encounter for therapeutic drug level monitoring: Secondary | ICD-10-CM | POA: Diagnosis not present

## 2014-07-11 LAB — POCT INR: INR: 2.7

## 2014-07-13 ENCOUNTER — Other Ambulatory Visit: Payer: Self-pay | Admitting: Family Medicine

## 2014-07-14 ENCOUNTER — Ambulatory Visit: Payer: Medicare Other | Admitting: Cardiology

## 2014-07-15 DIAGNOSIS — L821 Other seborrheic keratosis: Secondary | ICD-10-CM | POA: Diagnosis not present

## 2014-07-15 DIAGNOSIS — L259 Unspecified contact dermatitis, unspecified cause: Secondary | ICD-10-CM | POA: Diagnosis not present

## 2014-07-15 DIAGNOSIS — L570 Actinic keratosis: Secondary | ICD-10-CM | POA: Diagnosis not present

## 2014-07-16 ENCOUNTER — Telehealth: Payer: Self-pay | Admitting: Cardiology

## 2014-07-16 NOTE — Telephone Encounter (Signed)
New Message  Pt called will have a Remicade Infusion with [facility]. Pt requests a call back to discuss if she should come off of her coumadin.  Please call

## 2014-07-16 NOTE — Telephone Encounter (Signed)
Left message for patient, there is no reason she will need to hold the coumadin. She will just need to let them know when they take out the IV she will bleed more than usual. If she has questions she is to call back.

## 2014-07-21 ENCOUNTER — Telehealth: Payer: Self-pay | Admitting: Family Medicine

## 2014-07-21 NOTE — Telephone Encounter (Signed)
Pt would like to know if dr. Charlett Blake ever received the results from [facility]. And if so what is her next plan of action.

## 2014-07-22 DIAGNOSIS — M069 Rheumatoid arthritis, unspecified: Secondary | ICD-10-CM | POA: Diagnosis not present

## 2014-07-22 NOTE — Telephone Encounter (Signed)
So the labs are very specific to rheumatology most were normal but they will usually see you again when you have an abnormality. CRP was mildly hi and the treatment is clean food, minimal carbs and non processed, regular exercise, low fat, no trans fats, 8 hours of sleep adequate hydration and an 81 mg aspirin. The CCP is not something I order and would recommend she follow up with rheumatology

## 2014-07-22 NOTE — Telephone Encounter (Signed)
Will you please call and inform patient of Dr Frederik Pear message below  thanks

## 2014-07-22 NOTE — Telephone Encounter (Signed)
Please advise?  There are labs from 06-17-14, I dont see any result notes

## 2014-07-23 ENCOUNTER — Other Ambulatory Visit: Payer: Self-pay

## 2014-07-23 NOTE — Telephone Encounter (Signed)
Pt notified and made aware.  No further questions or concerns.

## 2014-07-24 ENCOUNTER — Other Ambulatory Visit: Payer: Self-pay | Admitting: Family Medicine

## 2014-07-24 DIAGNOSIS — Z139 Encounter for screening, unspecified: Secondary | ICD-10-CM

## 2014-07-24 DIAGNOSIS — Z1239 Encounter for other screening for malignant neoplasm of breast: Secondary | ICD-10-CM

## 2014-07-24 DIAGNOSIS — Z1231 Encounter for screening mammogram for malignant neoplasm of breast: Secondary | ICD-10-CM

## 2014-07-25 ENCOUNTER — Ambulatory Visit (INDEPENDENT_AMBULATORY_CARE_PROVIDER_SITE_OTHER): Payer: Medicare Other | Admitting: *Deleted

## 2014-07-25 ENCOUNTER — Other Ambulatory Visit: Payer: Self-pay | Admitting: *Deleted

## 2014-07-25 DIAGNOSIS — I4891 Unspecified atrial fibrillation: Secondary | ICD-10-CM | POA: Diagnosis not present

## 2014-07-25 DIAGNOSIS — Z5181 Encounter for therapeutic drug level monitoring: Secondary | ICD-10-CM

## 2014-07-25 LAB — POCT INR: INR: 2.3

## 2014-07-25 MED ORDER — DOFETILIDE 125 MCG PO CAPS: ORAL_CAPSULE | ORAL | Status: DC

## 2014-07-30 ENCOUNTER — Encounter: Payer: Self-pay | Admitting: Cardiology

## 2014-07-30 ENCOUNTER — Ambulatory Visit (INDEPENDENT_AMBULATORY_CARE_PROVIDER_SITE_OTHER): Payer: Medicare Other | Admitting: Cardiology

## 2014-07-30 VITALS — BP 124/68 | HR 58 | Ht 63.25 in | Wt 246.4 lb

## 2014-07-30 DIAGNOSIS — I10 Essential (primary) hypertension: Secondary | ICD-10-CM

## 2014-07-30 DIAGNOSIS — I2510 Atherosclerotic heart disease of native coronary artery without angina pectoris: Secondary | ICD-10-CM

## 2014-07-30 DIAGNOSIS — I480 Paroxysmal atrial fibrillation: Secondary | ICD-10-CM

## 2014-07-30 DIAGNOSIS — I679 Cerebrovascular disease, unspecified: Secondary | ICD-10-CM | POA: Insufficient documentation

## 2014-07-30 DIAGNOSIS — I4891 Unspecified atrial fibrillation: Secondary | ICD-10-CM

## 2014-07-30 LAB — CBC
HEMATOCRIT: 41.3 % (ref 36.0–46.0)
Hemoglobin: 13.9 g/dL (ref 12.0–15.0)
MCH: 30 pg (ref 26.0–34.0)
MCHC: 33.7 g/dL (ref 30.0–36.0)
MCV: 89.2 fL (ref 78.0–100.0)
PLATELETS: 225 10*3/uL (ref 150–400)
RBC: 4.63 MIL/uL (ref 3.87–5.11)
RDW: 14.2 % (ref 11.5–15.5)
WBC: 6.9 10*3/uL (ref 4.0–10.5)

## 2014-07-30 LAB — BASIC METABOLIC PANEL WITH GFR
BUN: 18 mg/dL (ref 6–23)
CALCIUM: 9.1 mg/dL (ref 8.4–10.5)
CHLORIDE: 104 meq/L (ref 96–112)
CO2: 25 mEq/L (ref 19–32)
CREATININE: 1.14 mg/dL — AB (ref 0.50–1.10)
GFR, Est African American: 57 mL/min — ABNORMAL LOW
GFR, Est Non African American: 50 mL/min — ABNORMAL LOW
Glucose, Bld: 98 mg/dL (ref 70–99)
Potassium: 3.9 mEq/L (ref 3.5–5.3)
Sodium: 140 mEq/L (ref 135–145)

## 2014-07-30 LAB — MAGNESIUM: MAGNESIUM: 2 mg/dL (ref 1.5–2.5)

## 2014-07-30 NOTE — Assessment & Plan Note (Signed)
Follow-up carotid Dopplers March 2017. 

## 2014-07-30 NOTE — Assessment & Plan Note (Signed)
Continue diet. Intolerant to statins. 

## 2014-07-30 NOTE — Assessment & Plan Note (Signed)
Not on aspirin given need for Coumadin. Intolerant to statins.

## 2014-07-30 NOTE — Patient Instructions (Signed)
Your physician wants you to follow-up in: 6 MONTHS WITH DR CRENSHAW You will receive a reminder letter in the mail two months in advance. If you don't receive a letter, please call our office to schedule the follow-up appointment.   Your physician recommends that you HAVE LAB WORK TODAY 

## 2014-07-30 NOTE — Assessment & Plan Note (Signed)
Blood pressure controlled. Continue present medications. 

## 2014-07-30 NOTE — Progress Notes (Signed)
HPI: FU coronary artery disease and atrial fibrillation. Last cardiac catheterization on Mar 06, 2007 showed an ejection fraction of 55%. There is nonobstructive plaque in the LAD, and circumflex and the right coronary artery was normal. 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 Also note the patient is extremely sensitive to Coumadin. MRA of the abdomen in September of 2012 showed no left renal artery stenosis and previous right nephrectomy. 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 Echocardiogram in June of 2014 showed normal LV function, grade 1 diastolic dysfunction and mild left atrial enlargement. Carotid Dopplers in March 2015 showed 0-39% stenosis bilaterally. FU recommended 2 years. Since I last saw her, the patient has dyspnea with more extreme activities but not with routine activities. It is relieved with rest. It is not associated with chest pain. There is no orthopnea, PND or pedal edema. There is no syncope or palpitations. There is no exertional chest pain.     Current Outpatient Prescriptions  Medication Sig Dispense Refill  . amLODipine (NORVASC) 10 MG tablet TAKE 1 TABLET (10 MG TOTAL) BY MOUTH DAILY.  30 tablet  6  . ammonium lactate (LAC-HYDRIN) 12 % lotion Apply 1 application topically daily as needed. For dry skin      . APRISO 0.375 G 24 hr capsule TAKE 4 CAPSULES BY MOUTH EVERY DAY  120 capsule  3  . dofetilide (TIKOSYN) 125 MCG capsule 3 TABLETS BY MOUTH TWICE DAILY  180 capsule  5  . escitalopram (LEXAPRO) 10 MG tablet TAKE 1 TABLET (10 MG TOTAL) BY MOUTH DAILY.  30 tablet  1  . esomeprazole (NEXIUM) 40 MG capsule Take 1 capsule (40 mg total) by mouth 2 (two) times daily.  180 capsule  2  . Glucosamine-Chondroitin (GLUCOSAMINE CHONDR COMPLEX PO) Take 1 tablet by mouth at bedtime. Hold while in hospital      . loperamide (IMODIUM A-D) 2 MG tablet  Take 1 mg by mouth daily as needed. Dihrrhea      . losartan (COZAAR) 100 MG tablet TAKE 1 TABLET (100 MG TOTAL) BY MOUTH DAILY.  30 tablet  6  . metoprolol tartrate (LOPRESSOR) 25 MG tablet Take 1 tablet (25 mg total) by mouth 2 (two) times daily.  60 tablet  12  . Multiple Vitamin (MULTIVITAMIN) capsule Take 1 capsule by mouth daily. 1 daily        . nitroGLYCERIN (NITROSTAT) 0.4 MG SL tablet Place 1 tablet (0.4 mg total) under the tongue every 5 (five) minutes as needed. 1 tab under tongue every 5 min for chest pain not to exceed 3 tabs in 15 min  25 tablet  3  . Probiotic Product ([place]) CAPS Take 1 capsule by mouth daily. 1 per day      . warfarin (COUMADIN) 2.5 MG tablet Take as directed  30 tablet  6   No current facility-administered medications for this visit.     Past Medical History  Diagnosis Date  . CAD (coronary artery disease)   . Depression   . GERD (gastroesophageal reflux disease)   . Hyperlipidemia   . Hypertension   . Crohn's colitis     she reports ulcerative colitis beginning in her 48's, biopsies 2008 suggest Crohn's not UC  . Pseudoaneurysm of right femoral artery   . Pancreatitis   . Chronic sinusitis   . Renal  oncocytoma     s/p right nephrectomy  . Myocardial infarction   . Uveitis   . Anxiety   . Obesity   . Chronic cough   . OSA (obstructive sleep apnea)   . Paroxysmal atrial fibrillation   . Arthritis   . IBS (irritable bowel syndrome)   . Blood transfusion without reported diagnosis 1976  . Sleep apnea   . Keloid of skin     Past Surgical History  Procedure Laterality Date  . Nephrectomy  10/2006    right secondary to oncocytoma   . Breast biopsy  1996    benign lesion   . Cholecystectomy  10/2006  . Tubal ligation    . Right renal artery repair  1976  . Right femoral  pseudoaneurysm & right groin hematoma evacuation  02/2007  . Colonoscopy w/ biopsies    . Esophagogastroduodenoscopy    . Coronary angioplasty       History   Social History  . Marital Status: Married    Spouse Name: N/A    Number of Children: 3  . Years of Education: N/A   Occupational History  .      retired   Social History Main Topics  . Smoking status: Never Smoker   . Smokeless tobacco: Never Used  . Alcohol Use: No  . Drug Use: No  . Sexual Activity: Not Currently   Other Topics Concern  . 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 Remaining systems are negative.  Physical Exam: Well-developed obese in no acute distress.  Skin is warm and dry.  HEENT is normal.  Neck is supple.  Chest is clear to auscultation with normal expansion.  Cardiovascular exam is regular rate and rhythm. 2/6 systolic murmur Abdominal exam nontender or distended. No masses palpated. Extremities show no edema. neuro grossly intact  ECG Sinus bradycardia with occasional PVCs.

## 2014-07-30 NOTE — Assessment & Plan Note (Signed)
Patient remains in sinus rhythm. Continue tikosyn. Check magnesium and renal function. Continue Coumadin. Check hemoglobin. She does not want to consider NOAC as she had perceived side effects with xarelto previously.

## 2014-08-04 DIAGNOSIS — M0579 Rheumatoid arthritis with rheumatoid factor of multiple sites without organ or systems involvement: Secondary | ICD-10-CM | POA: Diagnosis not present

## 2014-08-15 ENCOUNTER — Ambulatory Visit (INDEPENDENT_AMBULATORY_CARE_PROVIDER_SITE_OTHER): Payer: Medicare Other | Admitting: Pharmacist Clinician (PhC)/ Clinical Pharmacy Specialist

## 2014-08-15 DIAGNOSIS — Z5181 Encounter for therapeutic drug level monitoring: Secondary | ICD-10-CM | POA: Diagnosis not present

## 2014-08-15 DIAGNOSIS — I480 Paroxysmal atrial fibrillation: Secondary | ICD-10-CM

## 2014-08-15 LAB — POCT INR: INR: 3

## 2014-08-22 ENCOUNTER — Ambulatory Visit (HOSPITAL_BASED_OUTPATIENT_CLINIC_OR_DEPARTMENT_OTHER)
Admission: RE | Admit: 2014-08-22 | Discharge: 2014-08-22 | Disposition: A | Discharge status: Home / Self Care | Payer: Medicare Other | Source: Ambulatory Visit | Attending: Diagnostic Radiology | Admitting: Diagnostic Radiology

## 2014-08-22 DIAGNOSIS — Z1231 Encounter for screening mammogram for malignant neoplasm of breast: Secondary | ICD-10-CM | POA: Diagnosis not present

## 2014-08-28 ENCOUNTER — Encounter: Payer: Self-pay | Admitting: Family Medicine

## 2014-08-28 ENCOUNTER — Ambulatory Visit (INDEPENDENT_AMBULATORY_CARE_PROVIDER_SITE_OTHER): Payer: Medicare Other | Admitting: Family Medicine

## 2014-08-28 VITALS — BP 136/53 | HR 54 | Temp 97.8°F | Ht 63.25 in | Wt 246.8 lb

## 2014-08-28 DIAGNOSIS — I480 Paroxysmal atrial fibrillation: Secondary | ICD-10-CM

## 2014-08-28 DIAGNOSIS — H547 Unspecified visual loss: Secondary | ICD-10-CM

## 2014-08-28 DIAGNOSIS — K649 Unspecified hemorrhoids: Secondary | ICD-10-CM

## 2014-08-28 DIAGNOSIS — I10 Essential (primary) hypertension: Secondary | ICD-10-CM

## 2014-08-28 DIAGNOSIS — Z23 Encounter for immunization: Secondary | ICD-10-CM

## 2014-08-28 DIAGNOSIS — Z0000 Encounter for general adult medical examination without abnormal findings: Secondary | ICD-10-CM | POA: Diagnosis not present

## 2014-08-28 DIAGNOSIS — K50119 Crohn's disease of large intestine with unspecified complications: Secondary | ICD-10-CM

## 2014-08-28 DIAGNOSIS — E669 Obesity, unspecified: Secondary | ICD-10-CM

## 2014-08-28 DIAGNOSIS — K625 Hemorrhage of anus and rectum: Secondary | ICD-10-CM | POA: Diagnosis not present

## 2014-08-28 DIAGNOSIS — C4491 Basal cell carcinoma of skin, unspecified: Secondary | ICD-10-CM

## 2014-08-28 DIAGNOSIS — K219 Gastro-esophageal reflux disease without esophagitis: Secondary | ICD-10-CM | POA: Diagnosis not present

## 2014-08-28 DIAGNOSIS — I2510 Atherosclerotic heart disease of native coronary artery without angina pectoris: Secondary | ICD-10-CM | POA: Diagnosis not present

## 2014-08-28 DIAGNOSIS — E785 Hyperlipidemia, unspecified: Secondary | ICD-10-CM

## 2014-08-28 LAB — LIPID PANEL
CHOL/HDL RATIO: 5
Cholesterol: 209 mg/dL — ABNORMAL HIGH (ref 0–200)
HDL: 41 mg/dL (ref >39.00)
LDL Cholesterol: 136 mg/dL — ABNORMAL HIGH (ref 0–99)
NONHDL: 168
Triglycerides: 158 mg/dL — ABNORMAL HIGH (ref 0.0–149.0)
VLDL: 31.6 mg/dL (ref 0.0–40.0)

## 2014-08-28 LAB — HEPATIC FUNCTION PANEL
ALBUMIN: 3.3 g/dL — AB (ref 3.5–5.2)
ALT: 22 U/L (ref 0–35)
AST: 26 U/L (ref 0–37)
Alkaline Phosphatase: 73 U/L (ref 39–117)
BILIRUBIN DIRECT: 0.1 mg/dL (ref 0.0–0.3)
Total Bilirubin: 0.7 mg/dL (ref 0.2–1.2)
Total Protein: 7.9 g/dL (ref 6.0–8.3)

## 2014-08-28 LAB — TSH: TSH: 2.94 u[IU]/mL (ref 0.35–4.50)

## 2014-08-28 MED ORDER — HYDROCORTISONE ACETATE 25 MG RE SUPP: 25.0000 mg | Freq: Every evening | RECTAL | Status: DC | PRN

## 2014-08-28 NOTE — Progress Notes (Signed)
Pre visit review using our clinic review tool, if applicable. No additional management support is needed unless otherwise documented below in the visit note. 

## 2014-08-28 NOTE — Patient Instructions (Addendum)
Call Dr Celesta Aver office for repeat visit/colonoscopy his last note said to repeat in 2 years in 08/2012 Kegel exercises sets of 10 twice a day  Hemorrhoids Hemorrhoids are swollen veins around the rectum or anus. There are two types of hemorrhoids:   Internal hemorrhoids. These occur in the veins just inside the rectum. They may poke through to the outside and become irritated and painful.  External hemorrhoids. These occur in the veins outside the anus and can be felt as a painful swelling or hard lump near the anus. CAUSES  Pregnancy.   Obesity.   Constipation or diarrhea.   Straining to have a bowel movement.   Sitting for long periods on the toilet.  Heavy lifting or other activity that caused you to strain.  Anal intercourse. SYMPTOMS   Pain.   Anal itching or irritation.   Rectal bleeding.   Fecal leakage.   Anal swelling.   One or more lumps around the anus.  DIAGNOSIS  Your caregiver may be able to diagnose hemorrhoids by visual examination. Other examinations or tests that may be performed include:   Examination of the rectal area with a gloved hand (digital rectal exam).   Examination of anal canal using a small tube (scope).   A blood test if you have lost a significant amount of blood.  A test to look inside the colon (sigmoidoscopy or colonoscopy). TREATMENT Most hemorrhoids can be treated at home. However, if symptoms do not seem to be getting better or if you have a lot of rectal bleeding, your caregiver may perform a procedure to help make the hemorrhoids get smaller or remove them completely. Possible treatments include:   Placing a rubber band at the base of the hemorrhoid to cut off the circulation (rubber band ligation).   Injecting a chemical to shrink the hemorrhoid (sclerotherapy).   Using a tool to burn the hemorrhoid (infrared light therapy).   Surgically removing the hemorrhoid (hemorrhoidectomy).   Stapling the  hemorrhoid to block blood flow to the tissue (hemorrhoid stapling).  HOME CARE INSTRUCTIONS   Eat foods with fiber, such as whole grains, beans, nuts, fruits, and vegetables. Ask your doctor about taking products with added fiber in them (fibersupplements).  Increase fluid intake. Drink enough water and fluids to keep your urine clear or pale yellow.   Exercise regularly.   Go to the bathroom when you have the urge to have a bowel movement. Do not wait.   Avoid straining to have bowel movements.   Keep the anal area dry and clean. Use wet toilet paper or moist towelettes after a bowel movement.   Medicated creams and suppositories may be used or applied as directed.   Only take over-the-counter or prescription medicines as directed by your caregiver.   Take warm sitz baths for 15-20 minutes, 3-4 times a day to ease pain and discomfort.   Place ice packs on the hemorrhoids if they are tender and swollen. Using ice packs between sitz baths may be helpful.   Put ice in a plastic bag.   Place a towel between your skin and the bag.   Leave the ice on for 15-20 minutes, 3-4 times a day.   Do not use a donut-shaped pillow or sit on the toilet for long periods. This increases blood pooling and pain.  SEEK MEDICAL CARE IF:  You have increasing pain and swelling that is not controlled by treatment or medicine.  You have uncontrolled bleeding.  You have difficulty or  you are unable to have a bowel movement.  You have pain or inflammation outside the area of the hemorrhoids. MAKE SURE YOU:  Understand these instructions.  Will watch your condition.  Will get help right away if you are not doing well or get worse. Document Released: 10/07/2000 Document Revised: 09/26/2012 Document Reviewed: 08/14/2012 [facility] Patient Information 2015 [place], [place]. This information is not intended to replace advice given to you by your health care provider. Make sure you discuss  any questions you have with your health care provider.   Preventive Care for Adults A healthy lifestyle and preventive care can promote health and wellness. Preventive health guidelines for women include the following key practices.  A routine yearly physical is a good way to check with your health care provider about your health and preventive screening. It is a chance to share any concerns and updates on your health and to receive a thorough exam.  Visit your dentist for a routine exam and preventive care every 6 months. Brush your teeth twice a day and floss once a day. Good oral hygiene prevents tooth decay and gum disease.  The frequency of eye exams is based on your age, health, family medical history, use of contact lenses, and other factors. Follow your health care provider's recommendations for frequency of eye exams.  Eat a healthy diet. Foods like vegetables, fruits, whole grains, low-fat dairy products, and lean protein foods contain the nutrients you need without too many calories. Decrease your intake of foods high in solid fats, added sugars, and salt. Eat the right amount of calories for you.Get information about a proper diet from your health care provider, if necessary.  Regular physical exercise is one of the most important things you can do for your health. Most adults should get at least 150 minutes of moderate-intensity exercise (any activity that increases your heart rate and causes you to sweat) each week. In addition, most adults need muscle-strengthening exercises on 2 or more days a week.  Maintain a healthy weight. The body mass index (BMI) is a screening tool to identify possible weight problems. It provides an estimate of body fat based on height and weight. Your health care provider can find your BMI and can help you achieve or maintain a healthy weight.For adults 20 years and older:  A BMI below 18.5 is considered underweight.  A BMI of 18.5 to 24.9 is  normal.  A BMI of 25 to 29.9 is considered overweight.  A BMI of 30 and above is considered obese.  Maintain normal blood lipids and cholesterol levels by exercising and minimizing your intake of saturated fat. Eat a balanced diet with plenty of fruit and vegetables. Blood tests for lipids and cholesterol should begin at age 19 and be repeated every 5 years. If your lipid or cholesterol levels are high, you are over 50, or you are at high risk for heart disease, you may need your cholesterol levels checked more frequently.Ongoing high lipid and cholesterol levels should be treated with medicines if diet and exercise are not working.  If you smoke, find out from your health care provider how to quit. If you do not use tobacco, do not start.  Lung cancer screening is recommended for adults aged 58-80 years who are at high risk for developing lung cancer because of a history of smoking. A yearly low-dose CT scan of the lungs is recommended for people who have at least a 30-pack-year history of smoking and are a current  smoker or have quit within the past 15 years. A pack year of smoking is smoking an average of 1 pack of cigarettes a day for 1 year (for example: 1 pack a day for 30 years or 2 packs a day for 15 years). Yearly screening should continue until the smoker has stopped smoking for at least 15 years. Yearly screening should be stopped for people who develop a health problem that would prevent them from having lung cancer treatment.  If you are pregnant, do not drink alcohol. If you are breastfeeding, be very cautious about drinking alcohol. If you are not pregnant and choose to drink alcohol, do not have more than 1 drink per day. One drink is considered to be 12 ounces (355 mL) of beer, 5 ounces (148 mL) of wine, or 1.5 ounces (44 mL) of liquor.  Avoid use of street drugs. Do not share needles with anyone. Ask for help if you need support or instructions about stopping the use of  drugs.  High blood pressure causes heart disease and increases the risk of stroke. Your blood pressure should be checked at least every 1 to 2 years. Ongoing high blood pressure should be treated with medicines if weight loss and exercise do not work.  If you are 30-20 years old, ask your health care provider if you should take aspirin to prevent strokes.  Diabetes screening involves taking a blood sample to check your fasting blood sugar level. This should be done once every 3 years, after age 22, if you are within normal weight and without risk factors for diabetes. Testing should be considered at a younger age or be carried out more frequently if you are overweight and have at least 1 risk factor for diabetes.  Breast cancer screening is essential preventive care for women. You should practice "breast self-awareness." This means understanding the normal appearance and feel of your breasts and may include breast self-examination. Any changes detected, no matter how small, should be reported to a health care provider. Women in their 56s and 30s should have a clinical breast exam (CBE) by a health care provider as part of a regular health exam every 1 to 3 years. After age 50, women should have a CBE every year. Starting at age 77, women should consider having a mammogram (breast X-ray test) every year. Women who have a family history of breast cancer should talk to their health care provider about genetic screening. Women at a high risk of breast cancer should talk to their health care providers about having an MRI and a mammogram every year.  Breast cancer gene (BRCA)-related cancer risk assessment is recommended for women who have family members with BRCA-related cancers. BRCA-related cancers include breast, ovarian, tubal, and peritoneal cancers. Having family members with these cancers may be associated with an increased risk for harmful changes (mutations) in the breast cancer genes BRCA1 and BRCA2.  Results of the assessment will determine the need for genetic counseling and BRCA1 and BRCA2 testing.  Routine pelvic exams to screen for cancer are no longer recommended for nonpregnant women who are considered low risk for cancer of the pelvic organs (ovaries, uterus, and vagina) and who do not have symptoms. Ask your health care provider if a screening pelvic exam is right for you.  If you have had past treatment for cervical cancer or a condition that could lead to cancer, you need Pap tests and screening for cancer for at least 20 years after your treatment. If Pap  tests have been discontinued, your risk factors (such as having a new sexual partner) need to be reassessed to determine if screening should be resumed. Some women have medical problems that increase the chance of getting cervical cancer. In these cases, your health care provider may recommend more frequent screening and Pap tests.  The HPV test is an additional test that may be used for cervical cancer screening. The HPV test looks for the virus that can cause the cell changes on the cervix. The cells collected during the Pap test can be tested for HPV. The HPV test could be used to screen women aged 16 years and older, and should be used in women of any age who have unclear Pap test results. After the age of 70, women should have HPV testing at the same frequency as a Pap test.  Colorectal cancer can be detected and often prevented. Most routine colorectal cancer screening begins at the age of 89 years and continues through age 41 years. However, your health care provider may recommend screening at an earlier age if you have risk factors for colon cancer. On a yearly basis, your health care provider may provide home test kits to check for hidden blood in the stool. Use of a small camera at the end of a tube, to directly examine the colon (sigmoidoscopy or colonoscopy), can detect the earliest forms of colorectal cancer. Talk to your health  care provider about this at age 48, when routine screening begins. Direct exam of the colon should be repeated every 5-10 years through age 25 years, unless early forms of pre-cancerous polyps or small growths are found.  People who are at an increased risk for hepatitis B should be screened for this virus. You are considered at high risk for hepatitis B if:  You were born in a country where hepatitis B occurs often. Talk with your health care provider about which countries are considered high risk.  Your parents were born in a high-risk country and you have not received a shot to protect against hepatitis B (hepatitis B vaccine).  You have HIV or AIDS.  You use needles to inject street drugs.  You live with, or have sex with, someone who has hepatitis B.  You get hemodialysis treatment.  You take certain medicines for conditions like cancer, organ transplantation, and autoimmune conditions.  Hepatitis C blood testing is recommended for all people born from [birth year] through 1966 and any individual with known risks for hepatitis C.  Practice safe sex. Use condoms and avoid high-risk sexual practices to reduce the spread of sexually transmitted infections (STIs). STIs include gonorrhea, chlamydia, syphilis, trichomonas, herpes, HPV, and human immunodeficiency virus (HIV). Herpes, HIV, and HPV are viral illnesses that have no cure. They can result in disability, cancer, and death.  You should be screened for sexually transmitted illnesses (STIs) including gonorrhea and chlamydia if:  You are sexually active and are younger than 24 years.  You are older than 24 years and your health care provider tells you that you are at risk for this type of infection.  Your sexual activity has changed since you were last screened and you are at an increased risk for chlamydia or gonorrhea. Ask your health care provider if you are at risk.  If you are at risk of being infected with HIV, it is recommended  that you take a prescription medicine daily to prevent HIV infection. This is called preexposure prophylaxis (PrEP). You are considered at risk if:  You are a heterosexual woman, are sexually active, and are at increased risk for HIV infection.  You take drugs by injection.  You are sexually active with a partner who has HIV.  Talk with your health care provider about whether you are at high risk of being infected with HIV. If you choose to begin PrEP, you should first be tested for HIV. You should then be tested every 3 months for as long as you are taking PrEP.  Osteoporosis is a disease in which the bones lose minerals and strength with aging. This can result in serious bone fractures or breaks. The risk of osteoporosis can be identified using a bone density scan. Women ages 55 years and over and women at risk for fractures or osteoporosis should discuss screening with their health care providers. Ask your health care provider whether you should take a calcium supplement or vitamin D to reduce the rate of osteoporosis.  Menopause can be associated with physical symptoms and risks. Hormone replacement therapy is available to decrease symptoms and risks. You should talk to your health care provider about whether hormone replacement therapy is right for you.  Use sunscreen. Apply sunscreen liberally and repeatedly throughout the day. You should seek shade when your shadow is shorter than you. Protect yourself by wearing long sleeves, pants, a wide-brimmed hat, and sunglasses year round, whenever you are outdoors.  Once a month, do a whole body skin exam, using a mirror to look at the skin on your back. Tell your health care provider of new moles, moles that have irregular borders, moles that are larger than a pencil eraser, or moles that have changed in shape or color.  Stay current with required vaccines (immunizations).  Influenza vaccine. All adults should be immunized every year.  Tetanus,  diphtheria, and acellular pertussis (Td, Tdap) vaccine. Pregnant women should receive 1 dose of Tdap vaccine during each pregnancy. The dose should be obtained regardless of the length of time since the last dose. Immunization is preferred during the 27th-36th week of gestation. An adult who has not previously received Tdap or who does not know her vaccine status should receive 1 dose of Tdap. This initial dose should be followed by tetanus and diphtheria toxoids (Td) booster doses every 10 years. Adults with an unknown or incomplete history of completing a 3-dose immunization series with Td-containing vaccines should begin or complete a primary immunization series including a Tdap dose. Adults should receive a Td booster every 10 years.  Varicella vaccine. An adult without evidence of immunity to varicella should receive 2 doses or a second dose if she has previously received 1 dose. Pregnant females who do not have evidence of immunity should receive the first dose after pregnancy. This first dose should be obtained before leaving the health care facility. The second dose should be obtained 4-8 weeks after the first dose.  Human papillomavirus (HPV) vaccine. Females aged 13-26 years who have not received the vaccine previously should obtain the 3-dose series. The vaccine is not recommended for use in pregnant females. However, pregnancy testing is not needed before receiving a dose. If a female is found to be pregnant after receiving a dose, no treatment is needed. In that case, the remaining doses should be delayed until after the pregnancy. Immunization is recommended for any person with an immunocompromised condition through the age of 3 years if she did not get any or all doses earlier. During the 3-dose series, the second dose should be obtained 4-8  weeks after the first dose. The third dose should be obtained 24 weeks after the first dose and 16 weeks after the second dose.  Zoster vaccine. One dose  is recommended for adults aged 63 years or older unless certain conditions are present.  Measles, mumps, and rubella (MMR) vaccine. Adults born before [birth year] generally are considered immune to measles and mumps. Adults born in [birth year] or later should have 1 or more doses of MMR vaccine unless there is a contraindication to the vaccine or there is laboratory evidence of immunity to each of the three diseases. A routine second dose of MMR vaccine should be obtained at least 28 days after the first dose for students attending postsecondary schools, health care workers, or international travelers. People who received inactivated measles vaccine or an unknown type of measles vaccine during 1963-1967 should receive 2 doses of MMR vaccine. People who received inactivated mumps vaccine or an unknown type of mumps vaccine before 1979 and are at high risk for mumps infection should consider immunization with 2 doses of MMR vaccine. For females of childbearing age, rubella immunity should be determined. If there is no evidence of immunity, females who are not pregnant should be vaccinated. If there is no evidence of immunity, females who are pregnant should delay immunization until after pregnancy. Unvaccinated health care workers born before [birth year] who lack laboratory evidence of measles, mumps, or rubella immunity or laboratory confirmation of disease should consider measles and mumps immunization with 2 doses of MMR vaccine or rubella immunization with 1 dose of MMR vaccine.  Pneumococcal 13-valent conjugate (PCV13) vaccine. When indicated, a person who is uncertain of her immunization history and has no record of immunization should receive the PCV13 vaccine. An adult aged 7 years or older who has certain medical conditions and has not been previously immunized should receive 1 dose of PCV13 vaccine. This PCV13 should be followed with a dose of pneumococcal polysaccharide (PPSV23) vaccine. The PPSV23 vaccine dose should be  obtained at least 8 weeks after the dose of PCV13 vaccine. An adult aged 77 years or older who has certain medical conditions and previously received 1 or more doses of PPSV23 vaccine should receive 1 dose of PCV13. The PCV13 vaccine dose should be obtained 1 or more years after the last PPSV23 vaccine dose.  Pneumococcal polysaccharide (PPSV23) vaccine. When PCV13 is also indicated, PCV13 should be obtained first. All adults aged 72 years and older should be immunized. An adult younger than age 2 years who has certain medical conditions should be immunized. Any person who resides in a nursing home or long-term care facility should be immunized. An adult smoker should be immunized. People with an immunocompromised condition and certain other conditions should receive both PCV13 and PPSV23 vaccines. People with human immunodeficiency virus (HIV) infection should be immunized as soon as possible after diagnosis. Immunization during chemotherapy or radiation therapy should be avoided. Routine use of PPSV23 vaccine is not recommended for American Indians, [place] Natives, or people younger than 65 years unless there are medical conditions that require PPSV23 vaccine. When indicated, people who have unknown immunization and have no record of immunization should receive PPSV23 vaccine. One-time revaccination 5 years after the first dose of PPSV23 is recommended for people aged 19-64 years who have chronic kidney failure, nephrotic syndrome, asplenia, or immunocompromised conditions. People who received 1-2 doses of PPSV23 before age 94 years should receive another dose of PPSV23 vaccine at age 24 years or later if at least 5 years  have passed since the previous dose. Doses of PPSV23 are not needed for people immunized with PPSV23 at or after age 49 years.  Meningococcal vaccine. Adults with asplenia or persistent complement component deficiencies should receive 2 doses of quadrivalent meningococcal conjugate  (MenACWY-D) vaccine. The doses should be obtained at least 2 months apart. Microbiologists working with certain meningococcal bacteria, [place] recruits, people at risk during an outbreak, and people who travel to or live in countries with a high rate of meningitis should be immunized. A first-year college student up through age 66 years who is living in a residence hall should receive a dose if she did not receive a dose on or after her 16th birthday. Adults who have certain high-risk conditions should receive one or more doses of vaccine.  Hepatitis A vaccine. Adults who wish to be protected from this disease, have certain high-risk conditions, work with hepatitis A-infected animals, work in hepatitis A research labs, or travel to or work in countries with a high rate of hepatitis A should be immunized. Adults who were previously unvaccinated and who anticipate close contact with an international adoptee during the first 60 days after arrival in the [place] from a country with a high rate of hepatitis A should be immunized.  Hepatitis B vaccine. Adults who wish to be protected from this disease, have certain high-risk conditions, may be exposed to blood or other infectious body fluids, are household contacts or sex partners of hepatitis B positive people, are clients or workers in certain care facilities, or travel to or work in countries with a high rate of hepatitis B should be immunized.  Haemophilus influenzae type b (Hib) vaccine. A previously unvaccinated person with asplenia or sickle cell disease or having a scheduled splenectomy should receive 1 dose of Hib vaccine. Regardless of previous immunization, a recipient of a hematopoietic stem cell transplant should receive a 3-dose series 6-12 months after her successful transplant. Hib vaccine is not recommended for adults with HIV infection. Preventive Services / Frequency Ages 80 to 31 years  Blood pressure check.** / Every 1 to 2  years.  Lipid and cholesterol check.** / Every 5 years beginning at age 63.  Clinical breast exam.** / Every 3 years for women in their 83s and 14s.  BRCA-related cancer risk assessment.** / For women who have family members with a BRCA-related cancer (breast, ovarian, tubal, or peritoneal cancers).  Pap test.** / Every 2 years from ages 47 through 67. Every 3 years starting at age 63 through age 101 or 69 with a history of 3 consecutive normal Pap tests.  HPV screening.** / Every 3 years from ages 25 through ages 76 to 32 with a history of 3 consecutive normal Pap tests.  Hepatitis C blood test.** / For any individual with known risks for hepatitis C.  Skin self-exam. / Monthly.  Influenza vaccine. / Every year.  Tetanus, diphtheria, and acellular pertussis (Tdap, Td) vaccine.** / Consult your health care provider. Pregnant women should receive 1 dose of Tdap vaccine during each pregnancy. 1 dose of Td every 10 years.  Varicella vaccine.** / Consult your health care provider. Pregnant females who do not have evidence of immunity should receive the first dose after pregnancy.  HPV vaccine. / 3 doses over 6 months, if 6 and younger. The vaccine is not recommended for use in pregnant females. However, pregnancy testing is not needed before receiving a dose.  Measles, mumps, rubella (MMR) vaccine.** / You need at least 1 dose  of MMR if you were born in 1957 or later. You may also need a 2nd dose. For females of childbearing age, rubella immunity should be determined. If there is no evidence of immunity, females who are not pregnant should be vaccinated. If there is no evidence of immunity, females who are pregnant should delay immunization until after pregnancy.  Pneumococcal 13-valent conjugate (PCV13) vaccine.** / Consult your health care provider.  Pneumococcal polysaccharide (PPSV23) vaccine.** / 1 to 2 doses if you smoke cigarettes or if you have certain conditions.  Meningococcal  vaccine.** / 1 dose if you are age 39 to 47 years and a Market researcher living in a residence hall, or have one of several medical conditions, you need to get vaccinated against meningococcal disease. You may also need additional booster doses.  Hepatitis A vaccine.** / Consult your health care provider.  Hepatitis B vaccine.** / Consult your health care provider.  Haemophilus influenzae type b (Hib) vaccine.** / Consult your health care provider. Ages 51 to 104 years  Blood pressure check.** / Every 1 to 2 years.  Lipid and cholesterol check.** / Every 5 years beginning at age 59 years.  Lung cancer screening. / Every year if you are aged 51-80 years and have a 30-pack-year history of smoking and currently smoke or have quit within the past 15 years. Yearly screening is stopped once you have quit smoking for at least 15 years or develop a health problem that would prevent you from having lung cancer treatment.  Clinical breast exam.** / Every year after age 44 years.  BRCA-related cancer risk assessment.** / For women who have family members with a BRCA-related cancer (breast, ovarian, tubal, or peritoneal cancers).  Mammogram.** / Every year beginning at age 78 years and continuing for as long as you are in good health. Consult with your health care provider.  Pap test.** / Every 3 years starting at age 42 years through age 7 or 21 years with a history of 3 consecutive normal Pap tests.  HPV screening.** / Every 3 years from ages 32 years through ages 68 to 27 years with a history of 3 consecutive normal Pap tests.  Fecal occult blood test (FOBT) of stool. / Every year beginning at age 89 years and continuing until age 53 years. You may not need to do this test if you get a colonoscopy every 10 years.  Flexible sigmoidoscopy or colonoscopy.** / Every 5 years for a flexible sigmoidoscopy or every 10 years for a colonoscopy beginning at age 38 years and continuing until age 63  years.  Hepatitis C blood test.** / For all people born from [birth year] through 1965 and any individual with known risks for hepatitis C.  Skin self-exam. / Monthly.  Influenza vaccine. / Every year.  Tetanus, diphtheria, and acellular pertussis (Tdap/Td) vaccine.** / Consult your health care provider. Pregnant women should receive 1 dose of Tdap vaccine during each pregnancy. 1 dose of Td every 10 years.  Varicella vaccine.** / Consult your health care provider. Pregnant females who do not have evidence of immunity should receive the first dose after pregnancy.  Zoster vaccine.** / 1 dose for adults aged 94 years or older.  Measles, mumps, rubella (MMR) vaccine.** / You need at least 1 dose of MMR if you were born in 1957 or later. You may also need a 2nd dose. For females of childbearing age, rubella immunity should be determined. If there is no evidence of immunity, females who are not pregnant should  be vaccinated. If there is no evidence of immunity, females who are pregnant should delay immunization until after pregnancy.  Pneumococcal 13-valent conjugate (PCV13) vaccine.** / Consult your health care provider.  Pneumococcal polysaccharide (PPSV23) vaccine.** / 1 to 2 doses if you smoke cigarettes or if you have certain conditions.  Meningococcal vaccine.** / Consult your health care provider.  Hepatitis A vaccine.** / Consult your health care provider.  Hepatitis B vaccine.** / Consult your health care provider.  Haemophilus influenzae type b (Hib) vaccine.** / Consult your health care provider. Ages 51 years and over  Blood pressure check.** / Every 1 to 2 years.  Lipid and cholesterol check.** / Every 5 years beginning at age 82 years.  Lung cancer screening. / Every year if you are aged 25-80 years and have a 30-pack-year history of smoking and currently smoke or have quit within the past 15 years. Yearly screening is stopped once you have quit smoking for at least 15 years or  develop a health problem that would prevent you from having lung cancer treatment.  Clinical breast exam.** / Every year after age 49 years.  BRCA-related cancer risk assessment.** / For women who have family members with a BRCA-related cancer (breast, ovarian, tubal, or peritoneal cancers).  Mammogram.** / Every year beginning at age 55 years and continuing for as long as you are in good health. Consult with your health care provider.  Pap test.** / Every 3 years starting at age 48 years through age 2 or 33 years with 3 consecutive normal Pap tests. Testing can be stopped between 65 and 70 years with 3 consecutive normal Pap tests and no abnormal Pap or HPV tests in the past 10 years.  HPV screening.** / Every 3 years from ages 79 years through ages 37 or 48 years with a history of 3 consecutive normal Pap tests. Testing can be stopped between 65 and 70 years with 3 consecutive normal Pap tests and no abnormal Pap or HPV tests in the past 10 years.  Fecal occult blood test (FOBT) of stool. / Every year beginning at age 27 years and continuing until age 25 years. You may not need to do this test if you get a colonoscopy every 10 years.  Flexible sigmoidoscopy or colonoscopy.** / Every 5 years for a flexible sigmoidoscopy or every 10 years for a colonoscopy beginning at age 65 years and continuing until age 90 years.  Hepatitis C blood test.** / For all people born from [birth year] through 1965 and any individual with known risks for hepatitis C.  Osteoporosis screening.** / A one-time screening for women ages 33 years and over and women at risk for fractures or osteoporosis.  Skin self-exam. / Monthly.  Influenza vaccine. / Every year.  Tetanus, diphtheria, and acellular pertussis (Tdap/Td) vaccine.** / 1 dose of Td every 10 years.  Varicella vaccine.** / Consult your health care provider.  Zoster vaccine.** / 1 dose for adults aged 60 years or older.  Pneumococcal 13-valent conjugate  (PCV13) vaccine.** / Consult your health care provider.  Pneumococcal polysaccharide (PPSV23) vaccine.** / 1 dose for all adults aged 43 years and older.  Meningococcal vaccine.** / Consult your health care provider.  Hepatitis A vaccine.** / Consult your health care provider.  Hepatitis B vaccine.** / Consult your health care provider.  Haemophilus influenzae type b (Hib) vaccine.** / Consult your health care provider. ** Family history and personal history of risk and conditions may change your health care provider's recommendations. Document Released: 12/06/2001  Document Revised: 02/24/2014 Document Reviewed: 03/07/2011 [facility] Patient Information 2015 [place], [place]. This information is not intended to replace advice given to you by your health care provider. Make sure you discuss any questions you have with your health care provider.

## 2014-08-31 ENCOUNTER — Encounter: Payer: Self-pay | Admitting: Family Medicine

## 2014-08-31 NOTE — Assessment & Plan Note (Signed)
Patient denies any difficulties at home. No trouble with ADLs, depression or falls. No recent changes to vision or hearing. Is UTD with immunizations. Is UTD with screening. Discussed Advanced Directives, patient agrees to bring [place] copies of documents if can. Encouraged heart healthy diet, exercise as tolerated and adequate sleep. Sees Dr Stanford Breed of cardiology Sees [facility] pulmonology Sees Dr Janna Arch of [facility] Sees Dr Carlean Purl, [facility], last colonoscopy 09/10/12, will have repeat colonoscopy soon Pap was 2013 repeat in 2016, [facility] October 2015, repeat in 1-2 years

## 2014-08-31 NOTE — Assessment & Plan Note (Signed)
Tolerating Tikosyn, rate controlled, following with cardiology

## 2014-08-31 NOTE — Assessment & Plan Note (Signed)
Statin intolerant. Encouraged heart healthy diet, increase exercise, avoid trans fats, consider a krill oil cap daily 

## 2014-08-31 NOTE — Progress Notes (Signed)
Patient ID: [name], female   DOB: 1946-03-06, 68 y.o.   MRN: [id number] MARIT GOODWILL [id number] [birth year] 08/31/2014      Progress Note-Follow Up  Subjective  Chief Complaint  Chief Complaint  Patient presents with  . Annual Exam    physical  . Injections    prevnar    HPI  Patient is a 68 year old female in today for routine medical care. In today for wellness exam. Doing well. Continues to struggle with visual loss and is establishing with new opthamologist. Her crohns has improved but she has struggled with rectal irritation intermittently. No other recent illness. Denies CP/palp/SOB/HA/congestion/fevers/GI or GU c/o. Taking meds as prescribed  Past Medical History  Diagnosis Date  . CAD (coronary artery disease)   . Depression   . GERD (gastroesophageal reflux disease)   . Hyperlipidemia   . Hypertension   . Crohn's colitis     she reports ulcerative colitis beginning in her 41's, biopsies 2008 suggest Crohn's not UC  . Pseudoaneurysm of right femoral artery   . Pancreatitis   . Chronic sinusitis   . Renal oncocytoma     s/p right nephrectomy  . Myocardial infarction   . Uveitis   . Anxiety   . Obesity   . Chronic cough   . OSA (obstructive sleep apnea)   . Paroxysmal atrial fibrillation   . Arthritis   . IBS (irritable bowel syndrome)   . Blood transfusion without reported diagnosis 1976  . Sleep apnea   . Keloid of skin   . Medicare annual wellness visit, subsequent 08/17/2012    Past Surgical History  Procedure Laterality Date  . Nephrectomy  10/2006    right secondary to oncocytoma   . Breast biopsy  1996    benign lesion   . Cholecystectomy  10/2006  . Tubal ligation    . Right renal artery repair  1976  . Right femoral  pseudoaneurysm & right groin hematoma evacuation  02/2007  . Colonoscopy w/ biopsies    . Esophagogastroduodenoscopy    . Coronary angioplasty      Family History  Problem Relation Age of Onset  . Colitis Father    . Hypertension Mother   . Coronary artery disease Mother   . Stroke Mother   . Stroke Brother   . Colon cancer Paternal Aunt   . Clotting disorder Mother   . Clotting disorder Brother   . Clotting disorder Sister   . Heart disease Mother   . Heart disease Father   . Asthma Daughter   . Allergies Mother   . Asthma Maternal Uncle     History   Social History  . Marital Status: Married    Spouse Name: N/A    Number of Children: 3  . Years of Education: N/A   Occupational History  .      retired   Social History Main Topics  . Smoking status: Never Smoker   . Smokeless tobacco: Never Used  . Alcohol Use: No  . Drug Use: No  . Sexual Activity: Not Currently   Other Topics Concern  . 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 amLODipine (NORVASC) 10 MG tablet TAKE 1 TABLET (  10 MG TOTAL) BY MOUTH DAILY. 30 tablet 6  . ammonium lactate (LAC-HYDRIN) 12 % lotion Apply 1 application topically daily as needed. For dry skin    . APRISO 0.375 G 24 hr capsule TAKE 4 CAPSULES BY MOUTH EVERY DAY 120 capsule 3  . dofetilide (TIKOSYN) 125 MCG capsule 3 TABLETS BY MOUTH TWICE DAILY 180 capsule 5  . escitalopram (LEXAPRO) 10 MG tablet TAKE 1 TABLET (10 MG TOTAL) BY MOUTH DAILY. 30 tablet 1  . esomeprazole (NEXIUM) 40 MG capsule Take 1 capsule (40 mg total) by mouth 2 (two) times daily. 180 capsule 2  . Glucosamine-Chondroitin (GLUCOSAMINE CHONDR COMPLEX PO) Take 1 tablet by mouth at bedtime. Hold while in hospital    . InFLIXimab (REMICADE IV) Inject into the vein as directed.    . loperamide (IMODIUM A-D) 2 MG tablet Take 1 mg by mouth daily as needed. Dihrrhea    . losartan (COZAAR) 100 MG tablet TAKE 1 TABLET (100 MG TOTAL) BY MOUTH DAILY. 30 tablet 6  .  metoprolol tartrate (LOPRESSOR) 25 MG tablet Take 1 tablet (25 mg total) by mouth 2 (two) times daily. 60 tablet 12  . Multiple Vitamin (MULTIVITAMIN) capsule Take 1 capsule by mouth daily. 1 daily      . nitroGLYCERIN (NITROSTAT) 0.4 MG SL tablet Place 1 tablet (0.4 mg total) under the tongue every 5 (five) minutes as needed. 1 tab under tongue every 5 min for chest pain not to exceed 3 tabs in 15 min 25 tablet 3  . Probiotic Product ([place]) CAPS Take 1 capsule by mouth daily. 1 per day    . warfarin (COUMADIN) 2.5 MG tablet Take as directed 30 tablet 6   No current facility-administered medications on file prior to visit.    Allergies  Allergen Reactions  . Cefdinir     Pt cannot take; may interfere with AFib.  . Codeine     Heart beats fast   . Guaifenesin Er Nausea And Vomiting and Other (See Comments)    Headaches; can tolerate liquid.  . Latex     Breathing problems  . Mucilgen [Psyllium]   . Percocet [Oxycodone-Acetaminophen]     Itching & swelling in jaw    Review of Systems  Review of Systems  Constitutional: Negative for fever, chills and malaise/fatigue.  HENT: Negative for congestion, hearing loss and nosebleeds.   Eyes: Negative for photophobia, pain and discharge.  Respiratory: Negative for cough, sputum production, shortness of breath and wheezing.   Cardiovascular: Negative for chest pain, palpitations and leg swelling.  Gastrointestinal: Negative for heartburn, nausea, vomiting, abdominal pain, diarrhea, constipation and blood in stool.  Genitourinary: Negative for dysuria, urgency, frequency and hematuria.  Musculoskeletal: Negative for myalgias, back pain and falls.  Skin: Negative for rash.  Neurological: Negative for dizziness, tremors, sensory change, focal weakness, loss of consciousness, weakness and headaches.  Endo/Heme/Allergies: Negative for polydipsia. Does not bruise/bleed easily.  Psychiatric/Behavioral: Negative for depression  and suicidal ideas. The patient is not nervous/anxious and does not have insomnia.     Objective  BP 136/53 mmHg  Pulse 54  Temp(Src) 97.8 F (36.6 C) (Tympanic)  Ht 5' 3.25" (1.607 m)  Wt 246 lb 12.8 oz (111.948 kg)  BMI 43.35 kg/m2  SpO2 96%  Physical Exam  Physical Exam  Constitutional: She is oriented to person, place, and time and well-developed, well-nourished, and in no distress. No distress.  HENT:  Head: Normocephalic and atraumatic.  Eyes: Conjunctivae are normal.  Neck: Neck supple. No thyromegaly present.  Cardiovascular: Normal rate, regular rhythm and normal heart sounds.   No murmur heard. Pulmonary/Chest: Effort normal and breath sounds normal. She has no wheezes.  Abdominal: She exhibits no distension and no mass.  Musculoskeletal: She exhibits no edema.  Lymphadenopathy:    She has no cervical adenopathy.  Neurological: She is alert and oriented to person, place, and time.  Skin: Skin is warm and dry. No rash noted. She is not diaphoretic.  Psychiatric: Memory, affect and judgment normal.    Lab Results  Component Value Date   TSH 2.94 08/28/2014   Lab Results  Component Value Date   WBC 6.9 07/30/2014   HGB 13.9 07/30/2014   HCT 41.3 07/30/2014   MCV 89.2 07/30/2014   PLT 225 07/30/2014   Lab Results  Component Value Date   CREATININE 1.14* 07/30/2014   BUN 18 07/30/2014   NA 140 07/30/2014   K 3.9 07/30/2014   CL 104 07/30/2014   CO2 25 07/30/2014   Lab Results  Component Value Date   ALT 22 08/28/2014   AST 26 08/28/2014   ALKPHOS 73 08/28/2014   BILITOT 0.7 08/28/2014   Lab Results  Component Value Date   CHOL 209* 08/28/2014   Lab Results  Component Value Date   HDL 41.00 08/28/2014   Lab Results  Component Value Date   LDLCALC 136* 08/28/2014   Lab Results  Component Value Date   TRIG 158.0* 08/28/2014   Lab Results  Component Value Date   CHOLHDL 5 08/28/2014     Assessment & Plan  Obesity Encouraged DASH  diet, decrease po intake and increase exercise as tolerated. Needs 7-8 hours of sleep nightly. Avoid trans fats, eat small, frequent meals every 4-5 hours with lean proteins, complex carbs and healthy fats. Minimize simple carbs, GMO foods.  Hyperlipidemia Statin intolerant. Encouraged heart healthy diet, increase exercise, avoid trans fats, consider a krill oil cap daily  Basal cell carcinoma Follows with [facility] [place] [facility]  Crohn's colitis Follows with [facility], stable but does struggle with frequent rectal irritation. Try Anusol HC supp prn, cleanse with Verlee Monte and may use Desitin prn  GERD Avoid offending foods, start probiotics. Do not eat large meals in late evening and consider raising head of bed.   ATRIAL FIBRILLATION Tolerating Tikosyn, rate controlled, following with cardiology  Medicare annual wellness visit, subsequent Patient denies any difficulties at home. No trouble with ADLs, depression or falls. No recent changes to vision or hearing. Is UTD with immunizations. Is UTD with screening. Discussed Advanced Directives, patient agrees to bring [place] copies of documents if can. Encouraged heart healthy diet, exercise as tolerated and adequate sleep. Sees Dr Stanford Breed of cardiology Sees [facility]ology Sees Dr Janna Arch of [facility] Sees Dr Carlean Purl, [facility], last colonoscopy 09/10/12, will have repeat colonoscopy soon Pap was 2013 repeat in 2016, [facility] October 2015, repeat in 1-2 years

## 2014-08-31 NOTE — Assessment & Plan Note (Signed)
Avoid offending foods, start probiotics. Do not eat large meals in late evening and consider raising head of bed.  

## 2014-08-31 NOTE — Assessment & Plan Note (Signed)
Encouraged DASH diet, decrease po intake and increase exercise as tolerated. Needs 7-8 hours of sleep nightly. Avoid trans fats, eat small, frequent meals every 4-5 hours with lean proteins, complex carbs and healthy fats. Minimize simple carbs, GMO foods. 

## 2014-08-31 NOTE — Assessment & Plan Note (Signed)
Follows with [facility], stable but does struggle with frequent rectal irritation. Try Anusol HC supp prn, cleanse with Verlee Monte and may use Desitin prn

## 2014-08-31 NOTE — Assessment & Plan Note (Signed)
Follows with [facility] [place] [facility]

## 2014-09-01 DIAGNOSIS — M0589 Other rheumatoid arthritis with rheumatoid factor of multiple sites: Secondary | ICD-10-CM | POA: Diagnosis not present

## 2014-09-05 DIAGNOSIS — H2513 Age-related nuclear cataract, bilateral: Secondary | ICD-10-CM | POA: Diagnosis not present

## 2014-09-05 DIAGNOSIS — H40013 Open angle with borderline findings, low risk, bilateral: Secondary | ICD-10-CM | POA: Diagnosis not present

## 2014-09-05 DIAGNOSIS — H40033 Anatomical narrow angle, bilateral: Secondary | ICD-10-CM | POA: Diagnosis not present

## 2014-09-08 ENCOUNTER — Ambulatory Visit (INDEPENDENT_AMBULATORY_CARE_PROVIDER_SITE_OTHER): Payer: Medicare Other | Admitting: Pharmacist Clinician (PhC)/ Clinical Pharmacy Specialist

## 2014-09-08 DIAGNOSIS — I480 Paroxysmal atrial fibrillation: Secondary | ICD-10-CM

## 2014-09-08 DIAGNOSIS — Z5181 Encounter for therapeutic drug level monitoring: Secondary | ICD-10-CM | POA: Diagnosis not present

## 2014-09-08 LAB — POCT INR: INR: 2.1

## 2014-09-10 ENCOUNTER — Other Ambulatory Visit: Payer: Self-pay | Admitting: Family Medicine

## 2014-09-24 DIAGNOSIS — H35371 Puckering of macula, right eye: Secondary | ICD-10-CM | POA: Diagnosis not present

## 2014-09-24 DIAGNOSIS — H43813 Vitreous degeneration, bilateral: Secondary | ICD-10-CM | POA: Diagnosis not present

## 2014-09-24 DIAGNOSIS — H35342 Macular cyst, hole, or pseudohole, left eye: Secondary | ICD-10-CM | POA: Diagnosis not present

## 2014-10-03 DIAGNOSIS — H40013 Open angle with borderline findings, low risk, bilateral: Secondary | ICD-10-CM | POA: Diagnosis not present

## 2014-10-03 DIAGNOSIS — H2513 Age-related nuclear cataract, bilateral: Secondary | ICD-10-CM | POA: Diagnosis not present

## 2014-10-06 ENCOUNTER — Ambulatory Visit (INDEPENDENT_AMBULATORY_CARE_PROVIDER_SITE_OTHER): Payer: Medicare Other | Admitting: Pharmacist Clinician (PhC)/ Clinical Pharmacy Specialist

## 2014-10-06 DIAGNOSIS — Z5181 Encounter for therapeutic drug level monitoring: Secondary | ICD-10-CM

## 2014-10-06 DIAGNOSIS — I480 Paroxysmal atrial fibrillation: Secondary | ICD-10-CM | POA: Diagnosis not present

## 2014-10-06 LAB — POCT INR: INR: 2.2

## 2014-10-07 ENCOUNTER — Encounter: Payer: Self-pay | Admitting: Internal Medicine

## 2014-10-19 ENCOUNTER — Other Ambulatory Visit: Payer: Self-pay | Admitting: Family Medicine

## 2014-10-19 ENCOUNTER — Other Ambulatory Visit: Payer: Self-pay | Admitting: Internal Medicine

## 2014-10-21 ENCOUNTER — Ambulatory Visit (INDEPENDENT_AMBULATORY_CARE_PROVIDER_SITE_OTHER): Payer: Medicare Other | Admitting: Family Medicine

## 2014-10-21 ENCOUNTER — Encounter: Payer: Self-pay | Admitting: Family Medicine

## 2014-10-21 VITALS — BP 136/76 | HR 72 | Temp 98.8°F | Ht 63.25 in | Wt 245.4 lb

## 2014-10-21 DIAGNOSIS — M0589 Other rheumatoid arthritis with rheumatoid factor of multiple sites: Secondary | ICD-10-CM | POA: Diagnosis not present

## 2014-10-21 DIAGNOSIS — J209 Acute bronchitis, unspecified: Secondary | ICD-10-CM | POA: Diagnosis not present

## 2014-10-21 DIAGNOSIS — K5090 Crohn's disease, unspecified, without complications: Secondary | ICD-10-CM | POA: Diagnosis not present

## 2014-10-21 DIAGNOSIS — I10 Essential (primary) hypertension: Secondary | ICD-10-CM

## 2014-10-21 DIAGNOSIS — H209 Unspecified iridocyclitis: Secondary | ICD-10-CM | POA: Diagnosis not present

## 2014-10-21 DIAGNOSIS — I2510 Atherosclerotic heart disease of native coronary artery without angina pectoris: Secondary | ICD-10-CM | POA: Diagnosis not present

## 2014-10-21 DIAGNOSIS — Z85828 Personal history of other malignant neoplasm of skin: Secondary | ICD-10-CM | POA: Diagnosis not present

## 2014-10-21 MED ORDER — BENZONATATE 100 MG PO CAPS: 100.0000 mg | ORAL_CAPSULE | Freq: Three times a day (TID) | ORAL | Status: DC | PRN

## 2014-10-21 MED ORDER — ALBUTEROL SULFATE HFA 108 (90 BASE) MCG/ACT IN AERS: 2.0000 | INHALATION_SPRAY | Freq: Four times a day (QID) | RESPIRATORY_TRACT | Status: DC | PRN

## 2014-10-21 MED ORDER — METHYLPREDNISOLONE (PAK) 4 MG PO TABS: ORAL_TABLET | ORAL | Status: DC

## 2014-10-21 NOTE — Telephone Encounter (Signed)
Rx sent to the pharmacy by e-script.//AB/CMA 

## 2014-10-21 NOTE — Patient Instructions (Signed)
Elderberry liquid, twice daily ([email or web address], [employer])   Acute Bronchitis Bronchitis is inflammation of the airways that extend from the windpipe into the lungs (bronchi). The inflammation often causes mucus to develop. This leads to a cough, which is the most common symptom of bronchitis.  In acute bronchitis, the condition usually develops suddenly and goes away over time, usually in a couple weeks. Smoking, allergies, and asthma can make bronchitis worse. Repeated episodes of bronchitis may cause further lung problems.  CAUSES Acute bronchitis is most often caused by the same virus that causes a cold. The virus can spread from person to person (contagious) through coughing, sneezing, and touching contaminated objects. SIGNS AND SYMPTOMS   Cough.   Fever.   Coughing up mucus.   Body aches.   Chest congestion.   Chills.   Shortness of breath.   Sore throat.  DIAGNOSIS  Acute bronchitis is usually diagnosed through a physical exam. Your health care provider will also ask you questions about your medical history. Tests, such as chest X-rays, are sometimes done to rule out other conditions.  TREATMENT  Acute bronchitis usually goes away in a couple weeks. Oftentimes, no medical treatment is necessary. Medicines are sometimes given for relief of fever or cough. Antibiotic medicines are usually not needed but may be prescribed in certain situations. In some cases, an inhaler may be recommended to help reduce shortness of breath and control the cough. A cool mist vaporizer may also be used to help thin bronchial secretions and make it easier to clear the chest.  HOME CARE INSTRUCTIONS  Get plenty of rest.   Drink enough fluids to keep your urine clear or pale yellow (unless you have a medical condition that requires fluid restriction). Increasing fluids may help thin your respiratory secretions (sputum) and reduce chest congestion, and it will prevent dehydration.    Take medicines only as directed by your health care provider.  If you were prescribed an antibiotic medicine, finish it all even if you start to feel better.  Avoid smoking and secondhand smoke. Exposure to cigarette smoke or irritating chemicals will make bronchitis worse. If you are a smoker, consider using nicotine gum or skin patches to help control withdrawal symptoms. Quitting smoking will help your lungs heal faster.   Reduce the chances of another bout of acute bronchitis by washing your hands frequently, avoiding people with cold symptoms, and trying not to touch your hands to your mouth, nose, or eyes.   Keep all follow-up visits as directed by your health care provider.  SEEK MEDICAL CARE IF: Your symptoms do not improve after 1 week of treatment.  SEEK IMMEDIATE MEDICAL CARE IF:  You develop an increased fever or chills.   You have chest pain.   You have severe shortness of breath.  You have bloody sputum.   You develop dehydration.  You faint or repeatedly feel like you are going to pass out.  You develop repeated vomiting.  You develop a severe headache. MAKE SURE YOU:   Understand these instructions.  Will watch your condition.  Will get help right away if you are not doing well or get worse. Document Released: 11/17/2004 Document Revised: 02/24/2014 Document Reviewed: 04/02/2013 [facility] Patient Information 2015 [place], [place]. This information is not intended to replace advice given to you by your health care provider. Make sure you discuss any questions you have with your health care provider.

## 2014-10-21 NOTE — Progress Notes (Signed)
[name]  [id number] 07-16-1946 10/21/2014      Progress Note-Follow Up  Subjective  Chief Complaint  Chief Complaint  Patient presents with  . Sinus Problem    pressure and cough(dry)-sxs x 1 weeks    HPI  Patient is a 68 y.o. female in today for routine medical care. Patient is struggling with 8 days of sore throat, headache, congestion in head and chest. No fevers, chills,rhinorrhea. Denies CP/palp/SOB/HA/fevers/GI or GU c/o. Taking meds as prescribed  Past Medical History  Diagnosis Date  . CAD (coronary artery disease)   . Depression   . GERD (gastroesophageal reflux disease)   . Hyperlipidemia   . Hypertension   . Crohn's colitis     she reports ulcerative colitis beginning in her 55's, biopsies 2008 suggest Crohn's not UC  . Pseudoaneurysm of right femoral artery   . Pancreatitis   . Chronic sinusitis   . Renal oncocytoma     s/p right nephrectomy  . Myocardial infarction   . Uveitis   . Anxiety   . Obesity   . Chronic cough   . OSA (obstructive sleep apnea)   . Paroxysmal atrial fibrillation   . Arthritis   . IBS (irritable bowel syndrome)   . Blood transfusion without reported diagnosis 1976  . Sleep apnea   . Keloid of skin   . Medicare annual wellness visit, subsequent 08/17/2012    Past Surgical History  Procedure Laterality Date  . Nephrectomy  10/2006    right secondary to oncocytoma   . Breast biopsy  1996    benign lesion   . Cholecystectomy  10/2006  . Tubal ligation    . Right renal artery repair  1976  . Right femoral  pseudoaneurysm & right groin hematoma evacuation  02/2007  . Colonoscopy w/ biopsies    . Esophagogastroduodenoscopy    . Coronary angioplasty      Family History  Problem Relation Age of Onset  . Colitis Father   . Hypertension Mother   . Coronary artery disease Mother   . Stroke Mother   . Stroke Brother   . Colon cancer Paternal Aunt   . Clotting disorder Mother   . Clotting disorder Brother   .  Clotting disorder Sister   . Heart disease Mother   . Heart disease Father   . Asthma Daughter   . Allergies Mother   . Asthma Maternal Uncle     History   Social History  . Marital Status: Married    Spouse Name: N/A    Number of Children: 3  . Years of Education: N/A   Occupational History  .      retired   Social History Main Topics  . Smoking status: Never Smoker   . Smokeless tobacco: Never Used  . Alcohol Use: No  . Drug Use: No  . Sexual Activity: Not Currently   Other Topics Concern  . 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 amLODipine (NORVASC) 10 MG tablet TAKE 1 TABLET (10 MG TOTAL) BY MOUTH DAILY. 30 tablet 6  . ammonium lactate (LAC-HYDRIN) 12 % lotion Apply 1 application topically daily as needed. For dry skin    . dofetilide (TIKOSYN) 125  MCG capsule 3 TABLETS BY MOUTH TWICE DAILY 180 capsule 5  . escitalopram (LEXAPRO) 10 MG tablet TAKE 1 TABLET (10 MG TOTAL) BY MOUTH DAILY. 30 tablet 1  . esomeprazole (NEXIUM) 40 MG capsule Take 1 capsule (40 mg total) by mouth 2 (two) times daily. 180 capsule 2  . hydrocortisone (ANUSOL-HC) 25 MG suppository Place 1 suppository (25 mg total) rectally at bedtime as needed for hemorrhoids or itching. 12 suppository 2  . InFLIXimab (REMICADE IV) Inject into the vein as directed.    . loperamide (IMODIUM A-D) 2 MG tablet Take 1 mg by mouth daily as needed. Dihrrhea    . losartan (COZAAR) 100 MG tablet TAKE 1 TABLET (100 MG TOTAL) BY MOUTH DAILY. 30 tablet 6  . metoprolol tartrate (LOPRESSOR) 25 MG tablet Take 1 tablet (25 mg total) by mouth 2 (two) times daily. 60 tablet 12  . Multiple Vitamin (MULTIVITAMIN) capsule Take 1 capsule by mouth daily. 1 daily      . nitroGLYCERIN (NITROSTAT) 0.4 MG SL  tablet Place 1 tablet (0.4 mg total) under the tongue every 5 (five) minutes as needed. 1 tab under tongue every 5 min for chest pain not to exceed 3 tabs in 15 min 25 tablet 3  . Probiotic Product ([place]) CAPS Take 1 capsule by mouth daily. 1 per day    . warfarin (COUMADIN) 2.5 MG tablet Take as directed 30 tablet 6   No current facility-administered medications on file prior to visit.    Allergies  Allergen Reactions  . Cefdinir     Pt cannot take; may interfere with AFib.  . Codeine     Heart beats fast   . Guaifenesin Er Nausea And Vomiting and Other (See Comments)    Headaches; can tolerate liquid.  . Latex     Breathing problems  . Mucilgen [Psyllium]   . Percocet [Oxycodone-Acetaminophen]     Itching & swelling in jaw    Review of Systems  Review of Systems  Constitutional: Positive for malaise/fatigue. Negative for fever.  HENT: Positive for congestion and sore throat.   Eyes: Negative for discharge.  Respiratory: Positive for cough. Negative for shortness of breath.   Cardiovascular: Negative for chest pain, palpitations and leg swelling.  Gastrointestinal: Negative for nausea, abdominal pain and diarrhea.  Genitourinary: Negative for dysuria.  Musculoskeletal: Positive for myalgias. Negative for falls.  Skin: Negative for rash.  Neurological: Positive for headaches. Negative for loss of consciousness.  Endo/Heme/Allergies: Negative for polydipsia.  Psychiatric/Behavioral: Negative for depression and suicidal ideas. The patient is not nervous/anxious and does not have insomnia.     Objective  BP 176/60 mmHg  Pulse 72  Temp(Src) 98.8 F (37.1 C) (Oral)  Ht 5' 3.25" (1.607 m)  Wt 245 lb 6.4 oz (111.313 kg)  BMI 43.10 kg/m2  SpO2 95%  Physical Exam  Physical Exam  Constitutional: She is oriented to person, place, and time and well-developed, well-nourished, and in no distress. No distress.  HENT:  Head: Normocephalic and atraumatic.    Eyes: Conjunctivae are normal.  Neck: Neck supple. No thyromegaly present.  Cardiovascular: Normal rate, regular rhythm and normal heart sounds.   No murmur heard. Pulmonary/Chest: Effort normal and breath sounds normal. She has no wheezes.  Abdominal: She exhibits no distension and no mass.  Musculoskeletal: She exhibits no edema.  Lymphadenopathy:    She has no cervical adenopathy.  Neurological: She is alert and oriented to person, place, and time.  Skin: Skin is  warm and dry. No rash noted. She is not diaphoretic.  Psychiatric: Memory, affect and judgment normal.    Lab Results  Component Value Date   TSH 2.94 08/28/2014   Lab Results  Component Value Date   WBC 6.9 07/30/2014   HGB 13.9 07/30/2014   HCT 41.3 07/30/2014   MCV 89.2 07/30/2014   PLT 225 07/30/2014   Lab Results  Component Value Date   CREATININE 1.14* 07/30/2014   BUN 18 07/30/2014   NA 140 07/30/2014   K 3.9 07/30/2014   CL 104 07/30/2014   CO2 25 07/30/2014   Lab Results  Component Value Date   ALT 22 08/28/2014   AST 26 08/28/2014   ALKPHOS 73 08/28/2014   BILITOT 0.7 08/28/2014   Lab Results  Component Value Date   CHOL 209* 08/28/2014   Lab Results  Component Value Date   HDL 41.00 08/28/2014   Lab Results  Component Value Date   LDLCALC 136* 08/28/2014   Lab Results  Component Value Date   TRIG 158.0* 08/28/2014   Lab Results  Component Value Date   CHOLHDL 5 08/28/2014     Assessment & Plan  Essential hypertension Improved on recheck. adequately controlled given acute illness, no changes to meds. Encouraged heart healthy diet such as the DASH diet and exercise as tolerated.   Acute bronchitis Encouraged increased rest and hydration, add probiotics, zinc such as Coldeze or Xicam. Treat fevers as needed. Add Mucinex and given rx for Medrol. Call if worsens for further consideration

## 2014-10-21 NOTE — Progress Notes (Signed)
Pre visit review using our clinic review tool, if applicable. No additional management support is needed unless otherwise documented below in the visit note. 

## 2014-10-22 ENCOUNTER — Other Ambulatory Visit: Payer: Self-pay | Admitting: *Deleted

## 2014-10-22 DIAGNOSIS — K219 Gastro-esophageal reflux disease without esophagitis: Secondary | ICD-10-CM

## 2014-10-22 MED ORDER — DOFETILIDE 125 MCG PO CAPS: ORAL_CAPSULE | ORAL | Status: DC

## 2014-10-22 MED ORDER — ESCITALOPRAM OXALATE 10 MG PO TABS: ORAL_TABLET | ORAL | Status: DC

## 2014-10-22 MED ORDER — ESOMEPRAZOLE MAGNESIUM 40 MG PO CPDR: 40.0000 mg | DELAYED_RELEASE_CAPSULE | Freq: Two times a day (BID) | ORAL | Status: DC

## 2014-10-22 NOTE — Telephone Encounter (Signed)
Escitalopram 10 mg tab refilled per protocol. JG//CMA

## 2014-10-27 ENCOUNTER — Encounter: Payer: Self-pay | Admitting: Family Medicine

## 2014-10-27 DIAGNOSIS — J209 Acute bronchitis, unspecified: Secondary | ICD-10-CM | POA: Insufficient documentation

## 2014-10-27 DIAGNOSIS — M0589 Other rheumatoid arthritis with rheumatoid factor of multiple sites: Secondary | ICD-10-CM | POA: Diagnosis not present

## 2014-10-27 NOTE — Assessment & Plan Note (Signed)
Improved on recheck. adequately controlled given acute illness, no changes to meds. Encouraged heart healthy diet such as the DASH diet and exercise as tolerated.

## 2014-10-27 NOTE — Assessment & Plan Note (Signed)
Encouraged increased rest and hydration, add probiotics, zinc such as Coldeze or Xicam. Treat fevers as needed. Add Mucinex and given rx for Medrol. Call if worsens for further consideration

## 2014-10-29 DIAGNOSIS — H2512 Age-related nuclear cataract, left eye: Secondary | ICD-10-CM | POA: Diagnosis not present

## 2014-11-10 ENCOUNTER — Ambulatory Visit (INDEPENDENT_AMBULATORY_CARE_PROVIDER_SITE_OTHER): Payer: Medicare Other | Admitting: Pharmacist Clinician (PhC)/ Clinical Pharmacy Specialist

## 2014-11-10 ENCOUNTER — Other Ambulatory Visit: Payer: Self-pay | Admitting: *Deleted

## 2014-11-10 DIAGNOSIS — Z5181 Encounter for therapeutic drug level monitoring: Secondary | ICD-10-CM | POA: Diagnosis not present

## 2014-11-10 DIAGNOSIS — I480 Paroxysmal atrial fibrillation: Secondary | ICD-10-CM | POA: Diagnosis not present

## 2014-11-10 LAB — POCT INR: INR: 2.4

## 2014-11-10 MED ORDER — AMLODIPINE BESYLATE 10 MG PO TABS: ORAL_TABLET | ORAL | Status: DC

## 2014-11-13 DIAGNOSIS — H2512 Age-related nuclear cataract, left eye: Secondary | ICD-10-CM | POA: Diagnosis not present

## 2014-11-13 DIAGNOSIS — H25811 Combined forms of age-related cataract, right eye: Secondary | ICD-10-CM | POA: Diagnosis not present

## 2014-11-13 HISTORY — PX: OTHER SURGICAL HISTORY: SHX169

## 2014-11-19 ENCOUNTER — Telehealth: Payer: Self-pay | Admitting: Cardiology

## 2014-11-19 NOTE — Telephone Encounter (Signed)
Spoke with pt, [name] has moved to the highest tier on her insurance. She has looked into the assistance program and does not qualify. Aware we have some savings cards but not enough samples to keep her supplied. Patient voiced understanding

## 2014-11-19 NOTE — Telephone Encounter (Signed)
Pt says her [name] is going to cost over $700 this month.Do you have any suggestions please.

## 2014-12-08 ENCOUNTER — Other Ambulatory Visit: Payer: Self-pay | Admitting: Nurse Practitioner

## 2014-12-08 MED ORDER — WARFARIN SODIUM 2.5 MG PO TABS: ORAL_TABLET | ORAL | Status: DC

## 2014-12-08 MED ORDER — LOSARTAN POTASSIUM 100 MG PO TABS: ORAL_TABLET | ORAL | Status: DC

## 2014-12-09 ENCOUNTER — Telehealth: Payer: Self-pay | Admitting: Internal Medicine

## 2014-12-09 MED ORDER — HYDROCORTISONE 2.5 % RE CREA: 1.0000 "application " | TOPICAL_CREAM | RECTAL | Status: DC | PRN

## 2014-12-09 NOTE — Telephone Encounter (Signed)
Patient c/o bright red blood on the tissue with BMs.  She is sure it is hemorrhoids and not her crohn's.  Is it ok to put her on a banding spot?

## 2014-12-09 NOTE — Telephone Encounter (Signed)
Patient notified rx sent REV scheduled for 01/29/15

## 2014-12-09 NOTE — Telephone Encounter (Signed)
No because banding in setting of IBD is tricky. Not sure I will do it. Could need flex sig, etc We can Rx HC  cream prn while she waits

## 2014-12-15 ENCOUNTER — Other Ambulatory Visit: Payer: Self-pay | Admitting: *Deleted

## 2014-12-15 MED ORDER — METOPROLOL TARTRATE 25 MG PO TABS: 25.0000 mg | ORAL_TABLET | Freq: Two times a day (BID) | ORAL | Status: DC

## 2014-12-18 DIAGNOSIS — L570 Actinic keratosis: Secondary | ICD-10-CM | POA: Diagnosis not present

## 2014-12-18 DIAGNOSIS — L820 Inflamed seborrheic keratosis: Secondary | ICD-10-CM | POA: Diagnosis not present

## 2014-12-22 ENCOUNTER — Encounter: Payer: Self-pay | Admitting: Physician Assistant

## 2014-12-22 DIAGNOSIS — M0589 Other rheumatoid arthritis with rheumatoid factor of multiple sites: Secondary | ICD-10-CM | POA: Diagnosis not present

## 2014-12-24 ENCOUNTER — Ambulatory Visit (INDEPENDENT_AMBULATORY_CARE_PROVIDER_SITE_OTHER): Payer: Medicare Other | Admitting: Pharmacist Clinician (PhC)/ Clinical Pharmacy Specialist

## 2014-12-24 DIAGNOSIS — Z5181 Encounter for therapeutic drug level monitoring: Secondary | ICD-10-CM | POA: Diagnosis not present

## 2014-12-24 DIAGNOSIS — I480 Paroxysmal atrial fibrillation: Secondary | ICD-10-CM | POA: Diagnosis not present

## 2014-12-24 LAB — POCT INR: INR: 2.6

## 2015-01-09 ENCOUNTER — Other Ambulatory Visit: Payer: Self-pay | Admitting: *Deleted

## 2015-01-09 MED ORDER — METOPROLOL TARTRATE 25 MG PO TABS: 25.0000 mg | ORAL_TABLET | Freq: Two times a day (BID) | ORAL | Status: DC

## 2015-01-14 ENCOUNTER — Other Ambulatory Visit: Payer: Self-pay

## 2015-01-14 MED ORDER — DOFETILIDE 125 MCG PO CAPS: ORAL_CAPSULE | ORAL | Status: DC

## 2015-01-29 ENCOUNTER — Ambulatory Visit (INDEPENDENT_AMBULATORY_CARE_PROVIDER_SITE_OTHER): Payer: Medicare Other | Admitting: Internal Medicine

## 2015-01-29 ENCOUNTER — Encounter: Payer: Self-pay | Admitting: Internal Medicine

## 2015-01-29 ENCOUNTER — Ambulatory Visit: Payer: Medicare Other | Admitting: Internal Medicine

## 2015-01-29 VITALS — BP 132/60 | HR 64 | Ht 63.0 in | Wt 247.0 lb

## 2015-01-29 DIAGNOSIS — K5010 Crohn's disease of large intestine without complications: Secondary | ICD-10-CM | POA: Diagnosis not present

## 2015-01-29 DIAGNOSIS — K649 Unspecified hemorrhoids: Secondary | ICD-10-CM | POA: Diagnosis not present

## 2015-01-29 NOTE — Assessment & Plan Note (Signed)
No active symptoms and this at this time. I think the bleeding was her hemorrhoids and that has resolved, responds nicely to intermittent steroid suppositories. She will see me in 6 months sooner if needed. Continue the Remicade per Dr. Amil Amen for her arthritis and also treats her Crohn's disease.

## 2015-01-29 NOTE — Patient Instructions (Signed)
Follow up with Dr. Carlean Purl in 6 months.    I appreciate the opportunity to care for you.

## 2015-01-29 NOTE — Progress Notes (Signed)
   Subjective:    Patient ID: [name], female    DOB: [birth year], 69 y.o.   MRN: [id number] Chief complaint: Bleeding hemorrhoids HPI The patient is here for follow-up, she had rectal bleeding and called [place]. She had received adequate some suppositories from primary care and is used is intermittent with control of rectal bleeding. She is not having diarrhea, she is on Remicade for arthritis, with a history of uveitis. Dr. Amil Amen is prescribing this and treating her. No rectal pain or purulent discharge or any hint of an abscess from history.  Medications, allergies, past medical history, past surgical history, family history and social history are reviewed and updated in the EMR.  Review of Systems As above.    Objective:   Physical Exam BP 132/60 mmHg  Pulse 64  Ht 5\' 3"  (1.6 m)  Wt 247 lb (112.038 kg)  BMI 43.76 kg/m2 Obese, no acute distress. She was not interested in rectal exam today and she is asymptomatic.      Assessment & Plan:   1. Bleeding hemorrhoids   2. Crohn's colitis, without complications    Crohn's colitis No active symptoms and this at this time. I think the bleeding was her hemorrhoids and that has resolved, responds nicely to intermittent steroid suppositories. She will see me in 6 months sooner if needed. Continue the Remicade per Dr. Amil Amen for her arthritis and also treats her Crohn's disease.    Cc: Dr. Amil Amen

## 2015-02-04 ENCOUNTER — Ambulatory Visit (INDEPENDENT_AMBULATORY_CARE_PROVIDER_SITE_OTHER): Payer: Medicare Other | Admitting: Pharmacist Clinician (PhC)/ Clinical Pharmacy Specialist

## 2015-02-04 DIAGNOSIS — Z5181 Encounter for therapeutic drug level monitoring: Secondary | ICD-10-CM | POA: Diagnosis not present

## 2015-02-04 DIAGNOSIS — I480 Paroxysmal atrial fibrillation: Secondary | ICD-10-CM | POA: Diagnosis not present

## 2015-02-04 LAB — POCT INR: INR: 2.8

## 2015-02-16 DIAGNOSIS — M0589 Other rheumatoid arthritis with rheumatoid factor of multiple sites: Secondary | ICD-10-CM | POA: Diagnosis not present

## 2015-02-24 DIAGNOSIS — H35371 Puckering of macula, right eye: Secondary | ICD-10-CM | POA: Diagnosis not present

## 2015-02-24 DIAGNOSIS — H43813 Vitreous degeneration, bilateral: Secondary | ICD-10-CM | POA: Diagnosis not present

## 2015-02-24 DIAGNOSIS — H35343 Macular cyst, hole, or pseudohole, bilateral: Secondary | ICD-10-CM | POA: Diagnosis not present

## 2015-02-26 ENCOUNTER — Ambulatory Visit: Payer: Medicare Other | Admitting: Family Medicine

## 2015-03-05 ENCOUNTER — Ambulatory Visit (INDEPENDENT_AMBULATORY_CARE_PROVIDER_SITE_OTHER): Payer: Medicare Other | Admitting: Family Medicine

## 2015-03-05 ENCOUNTER — Encounter: Payer: Self-pay | Admitting: Family Medicine

## 2015-03-05 VITALS — BP 122/82 | HR 58 | Temp 97.7°F | Ht 63.5 in | Wt 241.4 lb

## 2015-03-05 DIAGNOSIS — I10 Essential (primary) hypertension: Secondary | ICD-10-CM

## 2015-03-05 DIAGNOSIS — E785 Hyperlipidemia, unspecified: Secondary | ICD-10-CM

## 2015-03-05 DIAGNOSIS — R739 Hyperglycemia, unspecified: Secondary | ICD-10-CM | POA: Diagnosis not present

## 2015-03-05 DIAGNOSIS — I4891 Unspecified atrial fibrillation: Secondary | ICD-10-CM

## 2015-03-05 DIAGNOSIS — E782 Mixed hyperlipidemia: Secondary | ICD-10-CM | POA: Diagnosis not present

## 2015-03-05 DIAGNOSIS — E669 Obesity, unspecified: Secondary | ICD-10-CM | POA: Diagnosis not present

## 2015-03-05 DIAGNOSIS — K219 Gastro-esophageal reflux disease without esophagitis: Secondary | ICD-10-CM

## 2015-03-05 DIAGNOSIS — M109 Gout, unspecified: Secondary | ICD-10-CM

## 2015-03-05 DIAGNOSIS — C4491 Basal cell carcinoma of skin, unspecified: Secondary | ICD-10-CM

## 2015-03-05 DIAGNOSIS — R600 Localized edema: Secondary | ICD-10-CM

## 2015-03-05 LAB — URIC ACID: Uric Acid, Serum: 7.7 mg/dL — ABNORMAL HIGH (ref 2.4–7.0)

## 2015-03-05 LAB — COMPREHENSIVE METABOLIC PANEL
ALBUMIN: 3.8 g/dL (ref 3.5–5.2)
ALK PHOS: 68 U/L (ref 39–117)
ALT: 21 U/L (ref 0–35)
AST: 23 U/L (ref 0–37)
BILIRUBIN TOTAL: 0.5 mg/dL (ref 0.2–1.2)
BUN: 21 mg/dL (ref 6–23)
CO2: 28 mEq/L (ref 19–32)
Calcium: 9.6 mg/dL (ref 8.4–10.5)
Chloride: 103 mEq/L (ref 96–112)
Creatinine, Ser: 1.01 mg/dL (ref 0.40–1.20)
GFR: 57.67 mL/min — ABNORMAL LOW (ref >60.00)
Glucose, Bld: 90 mg/dL (ref 70–99)
Potassium: 4 mEq/L (ref 3.5–5.1)
SODIUM: 138 meq/L (ref 135–145)
Total Protein: 7.6 g/dL (ref 6.0–8.3)

## 2015-03-05 LAB — CBC
HEMATOCRIT: 42.8 % (ref 36.0–46.0)
Hemoglobin: 14.5 g/dL (ref 12.0–15.0)
MCHC: 33.8 g/dL (ref 30.0–36.0)
MCV: 92.2 fl (ref 78.0–100.0)
Platelets: 214 10*3/uL (ref 150.0–400.0)
RBC: 4.65 Mil/uL (ref 3.87–5.11)
RDW: 13.4 % (ref 11.5–15.5)
WBC: 7.7 10*3/uL (ref 4.0–10.5)

## 2015-03-05 LAB — LIPID PANEL
CHOL/HDL RATIO: 4
Cholesterol: 171 mg/dL (ref 0–200)
HDL: 44.8 mg/dL (ref >39.00)
LDL CALC: 100 mg/dL — AB (ref 0–99)
NonHDL: 126.2
TRIGLYCERIDES: 130 mg/dL (ref 0.0–149.0)
VLDL: 26 mg/dL (ref 0.0–40.0)

## 2015-03-05 LAB — TSH: TSH: 3.03 u[IU]/mL (ref 0.35–4.50)

## 2015-03-05 MED ORDER — ESCITALOPRAM OXALATE 10 MG PO TABS: ORAL_TABLET | ORAL | Status: DC

## 2015-03-05 NOTE — Patient Instructions (Signed)
Consider a course of physical therapy to help with unsteady gait   Basic Carbohydrate Counting  Carbohydrate counting is a method for keeping track of the amount of carbohydrates you eat. Eating carbohydrates naturally increases the level of sugar (glucose) in your blood, so it is important for you to know the amount that is okay for you to have in every meal. Carbohydrate counting helps keep the level of glucose in your blood within normal limits. The amount of carbohydrates allowed is different for every person. A dietitian can help you calculate the amount that is right for you. Once you know the amount of carbohydrates you can have, you can count the carbohydrates in the foods you want to eat. Carbohydrates are found in the following foods:  Grains, such as breads and cereals.  Dried beans and soy products.  Starchy vegetables, such as potatoes, peas, and corn.  Fruit and fruit juices.  Milk and yogurt.  Sweets and snack foods, such as cake, cookies, candy, chips, soft drinks, and fruit drinks. CARBOHYDRATE COUNTING There are two ways to count the carbohydrates in your food. You can use either of the methods or a combination of both. Reading the "Nutrition Facts" on [place] The "Nutrition Facts" is an area that is included on the labels of almost all packaged food and beverages in the [place]. It includes the serving size of that food or beverage and information about the nutrients in each serving of the food, including the grams (g) of carbohydrate per serving.  Decide the number of servings of this food or beverage that you will be able to eat or drink. Multiply that number of servings by the number of grams of carbohydrate that is listed on the label for that serving. The total will be the amount of carbohydrates you will be having when you eat or drink this food or beverage. Learning Standard Serving Sizes of Food When you eat food that is not packaged or does not include  "Nutrition Facts" on the label, you need to measure the servings in order to count the amount of carbohydrates.A serving of most carbohydrate-rich foods contains about 15 g of carbohydrates. The following list includes serving sizes of carbohydrate-rich foods that provide 15 g ofcarbohydrate per serving:   1 slice of bread (1 oz) or 1 six-inch tortilla.    of a hamburger bun or English muffin.  4-6 crackers.   cup unsweetened dry cereal.    cup hot cereal.   cup rice or pasta.    cup mashed potatoes or  of a large baked potato.  1 cup fresh fruit or one small piece of fruit.    cup canned or frozen fruit or fruit juice.  1 cup milk.   cup plain fat-free yogurt or yogurt sweetened with artificial sweeteners.   cup cooked dried beans or starchy vegetable, such as peas, corn, or potatoes.  Decide the number of standard-size servings that you will eat. Multiply that number of servings by 15 (the grams of carbohydrates in that serving). For example, if you eat 2 cups of strawberries, you will have eaten 2 servings and 30 g of carbohydrates (2 servings x 15 g = 30 g). For foods such as soups and casseroles, in which more than one food is mixed in, you will need to count the carbohydrates in each food that is included. EXAMPLE OF CARBOHYDRATE COUNTING Sample Dinner  3 oz chicken breast.   cup of brown rice.   cup of  corn.  1 cup milk.   1 cup strawberries with sugar-free whipped topping.  Carbohydrate Calculation Step 1: Identify the foods that contain carbohydrates:   Rice.   Corn.   Milk.   Strawberries. Step 2:Calculate the number of servings eaten of each:   2 servings of rice.   1 serving of corn.   1 serving of milk.   1 serving of strawberries. Step 3: Multiply each of those number of servings by 15 g:   2 servings of rice x 15 g = 30 g.   1 serving of corn x 15 g = 15 g.   1 serving of milk x 15 g = 15 g.   1 serving of  strawberries x 15 g = 15 g. Step 4: Add together all of the amounts to find the total grams of carbohydrates eaten: 30 g + 15 g + 15 g + 15 g = 75 g. Document Released: 10/10/2005 Document Revised: 02/24/2014 Document Reviewed: 09/06/2013 [facility] Patient Information 2015 [place], [place]. This information is not intended to replace advice given to you by your health care provider. Make sure you discuss any questions you have with your health care provider.

## 2015-03-05 NOTE — Progress Notes (Signed)
Pre visit review using our clinic review tool, if applicable. No additional management support is needed unless otherwise documented below in the visit note. 

## 2015-03-10 NOTE — Progress Notes (Signed)
HPI: FU coronary artery disease and atrial fibrillation. Last cardiac catheterization on Mar 06, 2007 showed an ejection fraction of 55%. There is nonobstructive plaque in the LAD, and circumflex and the right coronary artery was normal. 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 Also note the patient is extremely sensitive to Coumadin. MRA of the abdomen in September of 2012 showed no left renal artery stenosis and previous right nephrectomy. 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 Echocardiogram in June of 2014 showed normal LV function, grade 1 diastolic dysfunction and mild left atrial enlargement. Carotid Dopplers in March 2015 showed 0-39% stenosis bilaterally. FU recommended 2 years. Since I last saw her, the patient has dyspnea with more extreme activities but not with routine activities. It is relieved with rest. It is not associated with chest pain. There is no orthopnea, PND or pedal edema. There is no syncope or palpitations. There is no exertional chest pain.   Current Outpatient Prescriptions  Medication Sig Dispense Refill  . amLODipine (NORVASC) 10 MG tablet TAKE 1 TABLET (10 MG TOTAL) BY MOUTH DAILY. 30 tablet 3  . ammonium lactate (LAC-HYDRIN) 12 % lotion Apply 1 application topically daily as needed. For dry skin    . dofetilide (TIKOSYN) 125 MCG capsule 3 TABLETS BY MOUTH TWICE DAILY 180 capsule 1  . escitalopram (LEXAPRO) 10 MG tablet TAKE 1 TABLET (10 MG TOTAL) BY MOUTH DAILY. 30 tablet 6  . esomeprazole (NEXIUM) 40 MG capsule Take 1 capsule (40 mg total) by mouth 2 (two) times daily. (Patient taking differently: Take 40 mg by mouth 2 (two) times daily. Pt takes Over the counter brand) 180 capsule 6  . hydrocortisone (ANUSOL-HC) 25 MG suppository Place 1 suppository (25 mg total) rectally at bedtime as needed for hemorrhoids or itching. 12 suppository 2    . InFLIXimab (REMICADE IV) Inject into the vein as directed.    . loperamide (IMODIUM A-D) 2 MG tablet Take 1 mg by mouth daily as needed. Dihrrhea    . losartan (COZAAR) 100 MG tablet TAKE 1 TABLET (100 MG TOTAL) BY MOUTH DAILY. 30 tablet 6  . metoprolol tartrate (LOPRESSOR) 25 MG tablet Take 1 tablet (25 mg total) by mouth 2 (two) times daily. 180 tablet 0  . Multiple Vitamin (MULTIVITAMIN) capsule Take 1 capsule by mouth daily. 1 daily      . nitroGLYCERIN (NITROSTAT) 0.4 MG SL tablet Place 1 tablet (0.4 mg total) under the tongue every 5 (five) minutes as needed. 1 tab under tongue every 5 min for chest pain not to exceed 3 tabs in 15 min 25 tablet 3  . Probiotic Product ([place]) CAPS Take 1 capsule by mouth daily. 1 per day    . warfarin (COUMADIN) 2.5 MG tablet Take as directed 30 tablet 6   No current facility-administered medications for this visit.     Past Medical History  Diagnosis Date  . CAD (coronary artery disease)   . Depression   . GERD (gastroesophageal reflux disease)   . Hyperlipidemia   . Hypertension   . Crohn's colitis     she reports ulcerative colitis beginning in her 64's, biopsies 2008 suggest Crohn's not UC  . Pseudoaneurysm of right femoral artery   . Pancreatitis   . Chronic sinusitis   . Renal oncocytoma     s/p right nephrectomy  . Myocardial  infarction   . Uveitis   . Anxiety   . Obesity   . Chronic cough   . OSA (obstructive sleep apnea)   . Paroxysmal atrial fibrillation   . Arthritis   . IBS (irritable bowel syndrome)   . Blood transfusion without reported diagnosis 1976  . Sleep apnea   . Keloid of skin   . Medicare annual wellness visit, subsequent 08/17/2012    Past Surgical History  Procedure Laterality Date  . Nephrectomy  10/2006    right secondary to oncocytoma   . Breast biopsy  1996    benign lesion   . Cholecystectomy  10/2006  . Tubal ligation    . Right renal artery repair  1976  . Right femoral   pseudoaneurysm & right groin hematoma evacuation  02/2007  . Colonoscopy w/ biopsies    . Esophagogastroduodenoscopy    . Coronary angioplasty    . Cataract surgery Left 11/13/14    History   Social History  . Marital Status: Married    Spouse Name: N/A  . Number of Children: 3  . Years of Education: N/A   Occupational History  . Retired     retired   Social History Main Topics  . Smoking status: Never Smoker   . Smokeless tobacco: Never Used  . Alcohol Use: No  . Drug Use: No  . Sexual Activity: Not Currently   Other Topics Concern  . 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 Remaining systems are negative.  Physical Exam: Well-developed well-nourished in no acute distress.  Skin is warm and dry.  HEENT is normal.  Neck is supple.  Chest is clear to auscultation with normal expansion.  Cardiovascular exam is regular rate and rhythm.  Abdominal exam nontender or distended. No masses palpated. Extremities show no edema. neuro grossly intact  ECG sinus bradycardia at a rate of 53. No ST changes.

## 2015-03-11 ENCOUNTER — Encounter: Payer: Self-pay | Admitting: Cardiology

## 2015-03-11 ENCOUNTER — Other Ambulatory Visit (INDEPENDENT_AMBULATORY_CARE_PROVIDER_SITE_OTHER): Payer: Medicare Other

## 2015-03-11 ENCOUNTER — Ambulatory Visit (INDEPENDENT_AMBULATORY_CARE_PROVIDER_SITE_OTHER): Payer: Medicare Other | Admitting: Cardiology

## 2015-03-11 ENCOUNTER — Other Ambulatory Visit: Payer: Medicare Other

## 2015-03-11 ENCOUNTER — Other Ambulatory Visit: Payer: Self-pay | Admitting: Cardiology

## 2015-03-11 DIAGNOSIS — I10 Essential (primary) hypertension: Secondary | ICD-10-CM

## 2015-03-11 DIAGNOSIS — I480 Paroxysmal atrial fibrillation: Secondary | ICD-10-CM | POA: Diagnosis not present

## 2015-03-11 DIAGNOSIS — I2510 Atherosclerotic heart disease of native coronary artery without angina pectoris: Secondary | ICD-10-CM

## 2015-03-11 DIAGNOSIS — I4891 Unspecified atrial fibrillation: Secondary | ICD-10-CM | POA: Diagnosis not present

## 2015-03-11 LAB — MAGNESIUM: Magnesium: 2 mg/dL (ref 1.5–2.5)

## 2015-03-11 MED ORDER — DOFETILIDE 125 MCG PO CAPS: ORAL_CAPSULE | ORAL | Status: DC

## 2015-03-11 NOTE — Assessment & Plan Note (Signed)
Follow-up carotid Dopplers March 2017. 

## 2015-03-11 NOTE — Assessment & Plan Note (Signed)
No aspirin given need for Coumadin. Intolerant to statins.

## 2015-03-11 NOTE — Assessment & Plan Note (Signed)
Intolerant to statins. Continue diet. 

## 2015-03-11 NOTE — Patient Instructions (Signed)
Your physician wants you to follow-up in: 6 MONTHS WITH DR CRENSHAW You will receive a reminder letter in the mail two months in advance. If you don't receive a letter, please call our office to schedule the follow-up appointment.   Your physician recommends that you HAVE LAB WORK TODAY 

## 2015-03-11 NOTE — Assessment & Plan Note (Signed)
Blood pressure mildly elevated. However typically controlled at home. Continue present medications and follow. Further adjustments based on follow-up readings.

## 2015-03-11 NOTE — Assessment & Plan Note (Signed)
Patient remains in sinus rhythm. Continue Coumadin and tikosyn. Recent potassium and hemoglobin normal. Check magnesium.

## 2015-03-12 ENCOUNTER — Other Ambulatory Visit: Payer: Self-pay | Admitting: Cardiology

## 2015-03-15 NOTE — Assessment & Plan Note (Addendum)
No new lesions of concern.

## 2015-03-15 NOTE — Progress Notes (Signed)
[name]  [id number] 1946/05/07 03/15/2015      Progress Note-Follow Up  Subjective  Chief Complaint  Chief Complaint  Patient presents with  . Follow-up    HPI  Patient is a 69 y.o. female in today for routine medical care. Patient is in today for follow-up. Is generally doing well. No recent illness. Bowels are moving well. Reports she is doing well with ADLs and a heart healthy diet. Denies CP/palp/SOB/HA/congestion/fevers/GI or GU c/o. Taking meds as prescribed  Past Medical History  Diagnosis Date  . CAD (coronary artery disease)   . Depression   . GERD (gastroesophageal reflux disease)   . Hyperlipidemia   . Hypertension   . Crohn's colitis     she reports ulcerative colitis beginning in her 8's, biopsies 2008 suggest Crohn's not UC  . Pseudoaneurysm of right femoral artery   . Pancreatitis   . Chronic sinusitis   . Renal oncocytoma     s/p right nephrectomy  . Myocardial infarction   . Uveitis   . Anxiety   . Obesity   . Chronic cough   . OSA (obstructive sleep apnea)   . Paroxysmal atrial fibrillation   . Arthritis   . IBS (irritable bowel syndrome)   . Blood transfusion without reported diagnosis 1976  . Sleep apnea   . Keloid of skin   . Medicare annual wellness visit, subsequent 08/17/2012    Past Surgical History  Procedure Laterality Date  . Nephrectomy  10/2006    right secondary to oncocytoma   . Breast biopsy  1996    benign lesion   . Cholecystectomy  10/2006  . Tubal ligation    . Right renal artery repair  1976  . Right femoral  pseudoaneurysm & right groin hematoma evacuation  02/2007  . Colonoscopy w/ biopsies    . Esophagogastroduodenoscopy    . Coronary angioplasty    . Cataract surgery Left 11/13/14    Family History  Problem Relation Age of Onset  . Colitis Father   . Hypertension Mother   . Coronary artery disease Mother   . Stroke Mother   . Stroke Brother   . Colon cancer Paternal Aunt   . Clotting disorder  Mother   . Clotting disorder Brother   . Clotting disorder Sister   . Heart disease Mother   . Heart disease Father   . Asthma Daughter   . Allergies Mother   . Asthma Maternal Uncle   . Colon cancer Paternal Aunt   . Colon polyps Neg Hx   . Kidney disease Neg Hx     History   Social History  . Marital Status: Married    Spouse Name: N/A  . Number of Children: 3  . Years of Education: N/A   Occupational History  . Retired     retired   Social History Main Topics  . Smoking status: Never Smoker   . Smokeless tobacco: Never Used  . Alcohol Use: No  . Drug Use: No  . Sexual Activity: Not Currently   Other Topics Concern  . 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 amLODipine (NORVASC) 10 MG tablet TAKE 1 TABLET (10 MG TOTAL) BY MOUTH DAILY. [place]  tablet 3  . ammonium lactate (LAC-HYDRIN) 12 % lotion Apply 1 application topically daily as needed. For dry skin    . esomeprazole (NEXIUM) 40 MG capsule Take 1 capsule (40 mg total) by mouth 2 (two) times daily. (Patient taking differently: Take 40 mg by mouth 2 (two) times daily. Pt takes Over the counter brand) 180 capsule 6  . InFLIXimab (REMICADE IV) Inject into the vein as directed.    . loperamide (IMODIUM A-D) 2 MG tablet Take 1 mg by mouth daily as needed. Dihrrhea    . losartan (COZAAR) 100 MG tablet TAKE 1 TABLET (100 MG TOTAL) BY MOUTH DAILY. 30 tablet 6  . metoprolol tartrate (LOPRESSOR) 25 MG tablet Take 1 tablet (25 mg total) by mouth 2 (two) times daily. 180 tablet 0  . Multiple Vitamin (MULTIVITAMIN) capsule Take 1 capsule by mouth daily. 1 daily      . nitroGLYCERIN (NITROSTAT) 0.4 MG SL tablet Place 1 tablet (0.4 mg total) under the tongue every 5 (five) minutes as needed. 1 tab under  tongue every 5 min for chest pain not to exceed 3 tabs in 15 min 25 tablet 3  . Probiotic Product ([place]) CAPS Take 1 capsule by mouth daily. 1 per day    . warfarin (COUMADIN) 2.5 MG tablet Take as directed 30 tablet 6  . hydrocortisone (ANUSOL-HC) 25 MG suppository Place 1 suppository (25 mg total) rectally at bedtime as needed for hemorrhoids or itching. 12 suppository 2   No current facility-administered medications on file prior to visit.    Allergies  Allergen Reactions  . Cefdinir     Pt cannot take; may interfere with AFib.  . Codeine     Heart beats fast   . Guaifenesin Er Nausea And Vomiting and Other (See Comments)    Headaches; can tolerate liquid.  . Latex     Breathing problems  . Mucilgen [Psyllium]   . Percocet [Oxycodone-Acetaminophen]     Itching & swelling in jaw    Review of Systems  Review of Systems  Constitutional: Negative for fever, chills and malaise/fatigue.  HENT: Negative for congestion, hearing loss and nosebleeds.   Eyes: Negative for discharge.  Respiratory: Negative for cough, sputum production, shortness of breath and wheezing.   Cardiovascular: Negative for chest pain, palpitations and leg swelling.  Gastrointestinal: Negative for heartburn, nausea, vomiting, abdominal pain, diarrhea, constipation and blood in stool.  Genitourinary: Negative for dysuria, urgency, frequency and hematuria.  Musculoskeletal: Negative for myalgias, back pain and falls.  Skin: Negative for rash.  Neurological: Negative for dizziness, tremors, sensory change, focal weakness, loss of consciousness, weakness and headaches.  Endo/Heme/Allergies: Negative for polydipsia. Does not bruise/bleed easily.  Psychiatric/Behavioral: Negative for depression and suicidal ideas. The patient is not nervous/anxious and does not have insomnia.     Objective  BP 122/82 mmHg  Pulse 58  Temp(Src) 97.7 F (36.5 C) (Oral)  Ht 5' 3.5" (1.613 m)  Wt 241 lb 6 oz  (109.487 kg)  BMI 42.08 kg/m2  SpO2 94%  Physical Exam  Physical Exam  Constitutional: She is oriented to person, place, and time and well-developed, well-nourished, and in no distress. No distress.  HENT:  Head: Normocephalic and atraumatic.  Right Ear: External ear normal.  Left Ear: External ear normal.  Nose: Nose normal.  Mouth/Throat: Oropharynx is clear and moist. No oropharyngeal exudate.  Eyes: Conjunctivae are normal. Pupils are equal, round, and reactive to light. Right eye exhibits no discharge. Left eye  exhibits no discharge. No scleral icterus.  Neck: Normal range of motion. Neck supple. No thyromegaly present.  Cardiovascular: Normal rate, regular rhythm, normal heart sounds and intact distal pulses.   No murmur heard. Pulmonary/Chest: Effort normal and breath sounds normal. No respiratory distress. She has no wheezes. She has no rales.  Abdominal: Soft. Bowel sounds are normal. She exhibits no distension and no mass. There is no tenderness.  Musculoskeletal: Normal range of motion. She exhibits no edema or tenderness.  Lymphadenopathy:    She has no cervical adenopathy.  Neurological: She is alert and oriented to person, place, and time. She has normal reflexes. No cranial nerve deficit. Coordination normal.  Skin: Skin is warm and dry. No rash noted. She is not diaphoretic.  Psychiatric: Mood, memory and affect normal.    Lab Results  Component Value Date   TSH 3.03 03/05/2015   Lab Results  Component Value Date   WBC 7.7 03/05/2015   HGB 14.5 03/05/2015   HCT 42.8 03/05/2015   MCV 92.2 03/05/2015   PLT 214.0 03/05/2015   Lab Results  Component Value Date   CREATININE 1.01 03/05/2015   BUN 21 03/05/2015   NA 138 03/05/2015   K 4.0 03/05/2015   CL 103 03/05/2015   CO2 28 03/05/2015   Lab Results  Component Value Date   ALT 21 03/05/2015   AST 23 03/05/2015   ALKPHOS 68 03/05/2015   BILITOT 0.5 03/05/2015   Lab Results  Component Value Date    CHOL 171 03/05/2015   Lab Results  Component Value Date   HDL 44.80 03/05/2015   Lab Results  Component Value Date   LDLCALC 100* 03/05/2015   Lab Results  Component Value Date   TRIG 130.0 03/05/2015   Lab Results  Component Value Date   CHOLHDL 4 03/05/2015     Assessment & Plan  Essential hypertension Well controlled, no changes to meds. Encouraged heart healthy diet such as the DASH diet and exercise as tolerated.    ATRIAL FIBRILLATION Rate controlled, tolerating coumadin   GERD Avoid offending foods, start probiotics. Do not eat large meals in late evening and consider raising head of bed.    Basal cell carcinoma No new lesions of concern.    Bilateral leg edema None today, encouraged to minimize sodium, elevate feet and consider compression hose.    Hyperlipidemia Encouraged heart healthy diet, increase exercise, avoid trans fats, consider a krill oil cap daily   Hyperglycemia  minimize simple carbs. Increase exercise as tolerated.

## 2015-03-15 NOTE — Assessment & Plan Note (Addendum)
minimize simple carbs. Increase exercise as tolerated.  

## 2015-03-15 NOTE — Assessment & Plan Note (Signed)
Avoid offending foods, start probiotics. Do not eat large meals in late evening and consider raising head of bed.  

## 2015-03-15 NOTE — Assessment & Plan Note (Signed)
Encouraged heart healthy diet, increase exercise, avoid trans fats, consider a krill oil cap daily 

## 2015-03-15 NOTE — Assessment & Plan Note (Signed)
Rate controlled, tolerating coumadin 

## 2015-03-15 NOTE — Assessment & Plan Note (Signed)
Well controlled, no changes to meds. Encouraged heart healthy diet such as the DASH diet and exercise as tolerated.  °

## 2015-03-15 NOTE — Assessment & Plan Note (Signed)
None today, encouraged to minimize sodium, elevate feet and consider compression hose.

## 2015-03-18 ENCOUNTER — Ambulatory Visit: Payer: Medicare Other | Admitting: Pharmacist Clinician (PhC)/ Clinical Pharmacy Specialist

## 2015-03-19 ENCOUNTER — Ambulatory Visit (INDEPENDENT_AMBULATORY_CARE_PROVIDER_SITE_OTHER): Payer: Medicare Other | Admitting: Pharmacist Clinician (PhC)/ Clinical Pharmacy Specialist

## 2015-03-19 DIAGNOSIS — I480 Paroxysmal atrial fibrillation: Secondary | ICD-10-CM

## 2015-03-19 DIAGNOSIS — Z5181 Encounter for therapeutic drug level monitoring: Secondary | ICD-10-CM

## 2015-03-19 LAB — POCT INR: INR: 3.4

## 2015-03-30 ENCOUNTER — Ambulatory Visit: Payer: Medicare Other | Admitting: Pulmonary Disease

## 2015-04-06 ENCOUNTER — Ambulatory Visit: Payer: Medicare Other | Admitting: Pulmonary Disease

## 2015-04-07 DIAGNOSIS — H2511 Age-related nuclear cataract, right eye: Secondary | ICD-10-CM | POA: Diagnosis not present

## 2015-04-07 DIAGNOSIS — H524 Presbyopia: Secondary | ICD-10-CM | POA: Diagnosis not present

## 2015-04-10 ENCOUNTER — Telehealth: Payer: Self-pay | Admitting: Cardiology

## 2015-04-10 NOTE — Telephone Encounter (Signed)
Patient states when she picked up her Tikosyn refill today, it was the generic.  Is this ok?  Please call.

## 2015-04-10 NOTE — Telephone Encounter (Signed)
Patient has 2 accounts---opened in error

## 2015-04-10 NOTE — Telephone Encounter (Signed)
Spoke with pt, the bottle reads dofetilide and was manufactured in [employer]	[place]. Discussed with sally earl pharm md, she researched and said it was okay for the patient to take. Patient made aware

## 2015-04-13 ENCOUNTER — Other Ambulatory Visit: Payer: Self-pay | Admitting: *Deleted

## 2015-04-13 DIAGNOSIS — M0589 Other rheumatoid arthritis with rheumatoid factor of multiple sites: Secondary | ICD-10-CM | POA: Diagnosis not present

## 2015-04-13 MED ORDER — METOPROLOL TARTRATE 25 MG PO TABS: 25.0000 mg | ORAL_TABLET | Freq: Two times a day (BID) | ORAL | Status: DC

## 2015-04-17 ENCOUNTER — Ambulatory Visit (INDEPENDENT_AMBULATORY_CARE_PROVIDER_SITE_OTHER): Payer: Medicare Other | Admitting: Pharmacist

## 2015-04-17 DIAGNOSIS — I480 Paroxysmal atrial fibrillation: Secondary | ICD-10-CM

## 2015-04-17 DIAGNOSIS — Z5181 Encounter for therapeutic drug level monitoring: Secondary | ICD-10-CM

## 2015-04-17 LAB — POCT INR: INR: 2.8

## 2015-04-30 ENCOUNTER — Ambulatory Visit (INDEPENDENT_AMBULATORY_CARE_PROVIDER_SITE_OTHER): Payer: Medicare Other | Admitting: Pulmonary Disease

## 2015-04-30 ENCOUNTER — Encounter: Payer: Self-pay | Admitting: Pulmonary Disease

## 2015-04-30 VITALS — BP 116/69 | HR 50 | Ht 63.0 in | Wt 236.0 lb

## 2015-04-30 DIAGNOSIS — I2510 Atherosclerotic heart disease of native coronary artery without angina pectoris: Secondary | ICD-10-CM | POA: Diagnosis not present

## 2015-04-30 DIAGNOSIS — G4733 Obstructive sleep apnea (adult) (pediatric): Secondary | ICD-10-CM

## 2015-04-30 DIAGNOSIS — Z9989 Dependence on other enabling machines and devices: Principal | ICD-10-CM

## 2015-04-30 NOTE — Assessment & Plan Note (Signed)
CPAP supplies will be renewed x 1 year Chk download  Weight loss encouraged, compliance with goal of at least 4-6 hrs every night is the expectation. Advised against medications with sedative side effects Cautioned against driving when sleepy - understanding that sleepiness will vary on a day to day basis

## 2015-04-30 NOTE — Progress Notes (Signed)
   Subjective:    Patient ID: [name], female    DOB: 13-Feb-1946, 69 y.o.   MRN: [id number]  HPI   Chief Complaint  Patient presents with  . Follow-up    Former [place] patient, CPAP machine working great.  Taking Remicade for Crohns Disease, concerned about CHF, wants to make sure she is okay and doesn't have any symptoms of CHF.   Sees dr Amil Amen for RA - on remicaide Feels rested -cpap has helped, compliant by report Mask ok, pr ok, DME good with supplies  PSG 2008 >. AHI 36/h Auto 2012 - optimal pr 12 cm , FF mask  Review of Systems neg for any significant sore throat, dysphagia, itching, sneezing, nasal congestion or excess/ purulent secretions, fever, chills, sweats, unintended wt loss, pleuritic or exertional cp, hempoptysis, orthopnea pnd or change in chronic leg swelling. Also denies presyncope, palpitations, heartburn, abdominal pain, nausea, vomiting, diarrhea or change in bowel or urinary habits, dysuria,hematuria, rash, arthralgias, visual complaints, headache, numbness weakness or ataxia.     Objective:   Physical Exam   Gen. Pleasant, obese, in no distress ENT - no lesions, no post nasal drip Neck: No JVD, no thyromegaly, no carotid bruits Lungs: no use of accessory muscles, no dullness to percussion, decreased without rales or rhonchi  Cardiovascular: Rhythm regular, heart sounds  normal, no murmurs or gallops, no peripheral edema Musculoskeletal: No deformities, no cyanosis or clubbing , no tremors      Assessment & Plan:

## 2015-04-30 NOTE — Patient Instructions (Signed)
CPAP supplies will be renewed x 1 year 

## 2015-05-08 ENCOUNTER — Telehealth: Payer: Self-pay | Admitting: Pulmonary Disease

## 2015-05-08 NOTE — Telephone Encounter (Signed)
Last OV 04/30/15 CPAP 12cm No residuals Good usage

## 2015-05-08 NOTE — Telephone Encounter (Signed)
I spoke with patient about results and she verbalized understanding and had no questions 

## 2015-05-11 ENCOUNTER — Ambulatory Visit: Payer: Medicare Other

## 2015-05-11 ENCOUNTER — Ambulatory Visit (INDEPENDENT_AMBULATORY_CARE_PROVIDER_SITE_OTHER): Payer: Medicare Other | Admitting: Pharmacist Clinician (PhC)/ Clinical Pharmacy Specialist

## 2015-05-11 VITALS — BP 132/76 | HR 54 | Ht 63.0 in | Wt 236.0 lb

## 2015-05-11 DIAGNOSIS — I480 Paroxysmal atrial fibrillation: Secondary | ICD-10-CM

## 2015-05-11 DIAGNOSIS — Z5181 Encounter for therapeutic drug level monitoring: Secondary | ICD-10-CM | POA: Diagnosis not present

## 2015-05-11 LAB — POCT INR: INR: 2.4

## 2015-05-11 NOTE — Patient Instructions (Signed)
Continue same medications   Keep appointments as planned 

## 2015-05-11 NOTE — Progress Notes (Signed)
1.) Reason for visit: EKG  2.) Name of MD requesting visit: Dr.Crenshaw  3.) H&P: Started generic Tikosyn appox 3 weeks ago  4.) ROS related to problem: No complaints  5.) Assessment and plan per MD: Continue same medication

## 2015-05-15 ENCOUNTER — Encounter: Payer: Self-pay | Admitting: Cardiology

## 2015-05-15 ENCOUNTER — Ambulatory Visit: Payer: Medicare Other | Admitting: Pharmacist Clinician (PhC)/ Clinical Pharmacy Specialist

## 2015-05-26 ENCOUNTER — Other Ambulatory Visit: Payer: Self-pay

## 2015-05-26 MED ORDER — AMLODIPINE BESYLATE 10 MG PO TABS: ORAL_TABLET | ORAL | Status: DC

## 2015-06-08 ENCOUNTER — Ambulatory Visit (INDEPENDENT_AMBULATORY_CARE_PROVIDER_SITE_OTHER): Payer: Medicare Other | Admitting: Pharmacist Clinician (PhC)/ Clinical Pharmacy Specialist

## 2015-06-08 DIAGNOSIS — Z5181 Encounter for therapeutic drug level monitoring: Secondary | ICD-10-CM | POA: Diagnosis not present

## 2015-06-08 DIAGNOSIS — I480 Paroxysmal atrial fibrillation: Secondary | ICD-10-CM | POA: Diagnosis not present

## 2015-06-08 LAB — POCT INR: INR: 2.5

## 2015-06-15 DIAGNOSIS — M0589 Other rheumatoid arthritis with rheumatoid factor of multiple sites: Secondary | ICD-10-CM | POA: Diagnosis not present

## 2015-06-18 ENCOUNTER — Other Ambulatory Visit: Payer: Self-pay | Admitting: Nurse Practitioner

## 2015-06-22 DIAGNOSIS — K5090 Crohn's disease, unspecified, without complications: Secondary | ICD-10-CM | POA: Diagnosis not present

## 2015-06-22 DIAGNOSIS — M79641 Pain in right hand: Secondary | ICD-10-CM | POA: Diagnosis not present

## 2015-06-22 DIAGNOSIS — M0589 Other rheumatoid arthritis with rheumatoid factor of multiple sites: Secondary | ICD-10-CM | POA: Diagnosis not present

## 2015-06-22 DIAGNOSIS — Z85828 Personal history of other malignant neoplasm of skin: Secondary | ICD-10-CM | POA: Diagnosis not present

## 2015-06-22 DIAGNOSIS — Z79899 Other long term (current) drug therapy: Secondary | ICD-10-CM | POA: Diagnosis not present

## 2015-06-22 DIAGNOSIS — H209 Unspecified iridocyclitis: Secondary | ICD-10-CM | POA: Diagnosis not present

## 2015-06-22 DIAGNOSIS — F329 Major depressive disorder, single episode, unspecified: Secondary | ICD-10-CM | POA: Diagnosis not present

## 2015-06-22 DIAGNOSIS — M79642 Pain in left hand: Secondary | ICD-10-CM | POA: Diagnosis not present

## 2015-06-24 ENCOUNTER — Other Ambulatory Visit: Payer: Self-pay | Admitting: Nurse Practitioner

## 2015-06-26 ENCOUNTER — Ambulatory Visit (INDEPENDENT_AMBULATORY_CARE_PROVIDER_SITE_OTHER): Payer: Medicare Other | Admitting: Family Medicine

## 2015-06-26 ENCOUNTER — Encounter: Payer: Self-pay | Admitting: Family Medicine

## 2015-06-26 VITALS — BP 124/74 | HR 54 | Temp 98.0°F | Ht 62.5 in | Wt 232.5 lb

## 2015-06-26 DIAGNOSIS — F32A Depression, unspecified: Secondary | ICD-10-CM

## 2015-06-26 DIAGNOSIS — F329 Major depressive disorder, single episode, unspecified: Secondary | ICD-10-CM

## 2015-06-26 DIAGNOSIS — I2510 Atherosclerotic heart disease of native coronary artery without angina pectoris: Secondary | ICD-10-CM | POA: Diagnosis not present

## 2015-06-26 DIAGNOSIS — E669 Obesity, unspecified: Secondary | ICD-10-CM | POA: Diagnosis not present

## 2015-06-26 DIAGNOSIS — I10 Essential (primary) hypertension: Secondary | ICD-10-CM

## 2015-06-26 DIAGNOSIS — F341 Dysthymic disorder: Secondary | ICD-10-CM

## 2015-06-26 DIAGNOSIS — Z23 Encounter for immunization: Secondary | ICD-10-CM | POA: Diagnosis not present

## 2015-06-26 DIAGNOSIS — E785 Hyperlipidemia, unspecified: Secondary | ICD-10-CM

## 2015-06-26 MED ORDER — ESCITALOPRAM OXALATE 20 MG PO TABS: 20.0000 mg | ORAL_TABLET | Freq: Every day | ORAL | Status: DC

## 2015-06-26 NOTE — Progress Notes (Signed)
Pre visit review using our clinic review tool, if applicable. No additional management support is needed unless otherwise documented below in the visit note. 

## 2015-06-26 NOTE — Patient Instructions (Signed)

## 2015-07-01 ENCOUNTER — Telehealth: Payer: Self-pay | Admitting: Family Medicine

## 2015-07-01 NOTE — Telephone Encounter (Signed)
Caller name: [name]   Relationship to patient: Self  Can be reached: [phone]  Pharmacy:  Reason for call: pt would like to remain on 10 mg because she says thatt she feels her heart racing when she take the "new" suggested dosage. She want to know should she notify the pharmacy or will the Dr.   She is requesting a call back to discuss.

## 2015-07-02 ENCOUNTER — Other Ambulatory Visit: Payer: Self-pay | Admitting: Cardiology

## 2015-07-02 NOTE — Telephone Encounter (Signed)
Please make sure she is otherwise doing OK. If so she can stay on the 10 mg of Lexapro daily

## 2015-07-02 NOTE — Telephone Encounter (Signed)
Called the patient informed of PCP instructions regarding medication.  She is doing well.

## 2015-07-12 NOTE — Progress Notes (Signed)
Subjective:    Patient ID: [name], female    DOB: May 22, 1946, 69 y.o.   MRN: [id number]  Chief Complaint  Patient presents with  . Depression    HPI Patient is in today for follow-up and evaluation of depression. She acknowledges fatigue and increased sleeping. She acknowledges anhedonia but denies suicidal ideation. She has had numerous losses of people close to her the last couple of years. Since 2013 she has lost her sister, her mother and then another sister. Her uveitis and Crohn's disease have been manageable despite all these recent stressors. Is following with rheumatology. No other recent illness or acute concerns. Encouraged increased hydration, 64 ounces of clear fluids daily. Minimize alcohol and caffeine. Eat small frequent meals with lean proteins and complex carbs. Avoid high and low blood sugars. Get adequate sleep, 7-8 hours a night. Needs exercise daily preferably in the morning.  Past Medical History  Diagnosis Date  . CAD (coronary artery disease)   . Depression   . GERD (gastroesophageal reflux disease)   . Hyperlipidemia   . Hypertension   . Crohn's colitis     she reports ulcerative colitis beginning in her 45's, biopsies 2008 suggest Crohn's not UC  . Pseudoaneurysm of right femoral artery   . Pancreatitis   . Chronic sinusitis   . Renal oncocytoma     s/p right nephrectomy  . Myocardial infarction   . Uveitis   . Anxiety   . Obesity   . Chronic cough   . OSA (obstructive sleep apnea)   . Paroxysmal atrial fibrillation   . Arthritis   . IBS (irritable bowel syndrome)   . Blood transfusion without reported diagnosis 1976  . Sleep apnea   . Keloid of skin   . Medicare annual wellness visit, subsequent 08/17/2012    Past Surgical History  Procedure Laterality Date  . Nephrectomy  10/2006    right secondary to oncocytoma   . Breast biopsy  1996    benign lesion   . Cholecystectomy  10/2006  . Tubal ligation    . Right renal artery repair   1976  . Right femoral  pseudoaneurysm & right groin hematoma evacuation  02/2007  . Colonoscopy w/ biopsies    . Esophagogastroduodenoscopy    . Coronary angioplasty    . Cataract surgery Left 11/13/14    Family History  Problem Relation Age of Onset  . Colitis Father   . Hypertension Mother   . Coronary artery disease Mother   . Stroke Mother   . Stroke Brother   . Colon cancer Paternal Aunt   . Clotting disorder Mother   . Clotting disorder Brother   . Clotting disorder Sister   . Heart disease Mother   . Heart disease Father   . Asthma Daughter   . Allergies Mother   . Asthma Maternal Uncle   . Colon cancer Paternal Aunt   . Colon polyps Neg Hx   . Kidney disease Neg Hx     Social History   Social History  . Marital Status: Married    Spouse Name: N/A  . Number of Children: 3  . Years of Education: N/A   Occupational History  . Retired     retired   Social History Main Topics  . Smoking status: Never Smoker   . Smokeless tobacco: Never Used  . Alcohol Use: No  . Drug Use: No  . Sexual Activity: Not Currently   Other Topics Concern  .  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 amLODipine (NORVASC) 10 MG tablet TAKE 1 TABLET (10 MG TOTAL) BY MOUTH DAILY. 30 tablet 3  . ammonium lactate (LAC-HYDRIN) 12 % lotion Apply 1 application topically daily as needed. For dry skin    . hydrocortisone (ANUSOL-HC) 25 MG suppository Place 1 suppository (25 mg total) rectally at bedtime as needed for hemorrhoids or itching. 12 suppository 2  . InFLIXimab (REMICADE IV) Inject into the vein as directed.    . loperamide (IMODIUM A-D) 2 MG tablet Take 1 mg by mouth daily as needed. Dihrrhea    . losartan (COZAAR) 100 MG tablet TAKE 1 TABLET (100 MG TOTAL) BY MOUTH  DAILY. 30 tablet 9  . metoprolol tartrate (LOPRESSOR) 25 MG tablet Take 1 tablet (25 mg total) by mouth 2 (two) times daily. 180 tablet 3  . Multiple Vitamin (MULTIVITAMIN) capsule Take 1 capsule by mouth daily. 1 daily      . nitroGLYCERIN (NITROSTAT) 0.4 MG SL tablet Place 1 tablet (0.4 mg total) under the tongue every 5 (five) minutes as needed. 1 tab under tongue every 5 min for chest pain not to exceed 3 tabs in 15 min 25 tablet 3  . Probiotic Product ([place]) CAPS Take 1 capsule by mouth daily. 1 per day    . warfarin (COUMADIN) 2.5 MG tablet Take 1-1.5 tablets by mouth daily as directed by coumadin clinic 40 tablet 5  . dofetilide (TIKOSYN) 125 MCG capsule 3 TABLETS BY MOUTH TWICE DAILY 180 capsule 3  . escitalopram (LEXAPRO) 10 MG tablet TAKE 1 TABLET (10 MG TOTAL) BY MOUTH DAILY. 30 tablet 6  . esomeprazole (NEXIUM) 40 MG capsule Take 1 capsule (40 mg total) by mouth 2 (two) times daily. (Patient taking differently: Take 40 mg by mouth 2 (two) times daily. Pt takes Over the counter brand) 180 capsule 6   No facility-administered medications prior to visit.    Allergies  Allergen Reactions  . Cefdinir     Pt cannot take; may interfere with AFib.  . Codeine     Heart beats fast   . Guaifenesin Er Nausea And Vomiting and Other (See Comments)    Headaches; can tolerate liquid.  . Latex     Breathing problems  . Mucilgen [Psyllium]   . Percocet [Oxycodone-Acetaminophen]     Itching & swelling in jaw    Review of Systems  Constitutional: Negative for fever and malaise/fatigue.  HENT: Negative for congestion.   Eyes: Negative for discharge.  Respiratory: Negative for shortness of breath.   Cardiovascular: Negative for chest pain, palpitations and leg swelling.  Gastrointestinal: Negative for nausea and abdominal pain.  Genitourinary: Negative for dysuria.  Musculoskeletal: Negative for falls.  Skin: Negative for rash.  Neurological: Negative for loss of  consciousness and headaches.  Endo/Heme/Allergies: Negative for environmental allergies.  Psychiatric/Behavioral: Negative for depression. The patient is not nervous/anxious.        Objective:    Physical Exam  Constitutional: She is oriented to person, place, and time. She appears well-developed and well-nourished. No distress.  HENT:  Head: Normocephalic and atraumatic.  Nose: Nose normal.  Eyes: Right eye exhibits no discharge. Left eye exhibits no discharge.  Neck: Normal range of motion.  Neck supple.  Cardiovascular: Normal rate and regular rhythm.   No murmur heard. Pulmonary/Chest: Effort normal and breath sounds normal.  Abdominal: Soft. Bowel sounds are normal. There is no tenderness.  Musculoskeletal: She exhibits no edema.  Neurological: She is alert and oriented to person, place, and time.  Skin: Skin is warm and dry.  Psychiatric: She has a normal mood and affect.  Nursing note and vitals reviewed.   BP 124/74 mmHg  Pulse 54  Temp(Src) 98 F (36.7 C) (Oral)  Ht 5' 2.5" (1.588 m)  Wt 232 lb 8 oz (105.461 kg)  BMI 41.82 kg/m2  SpO2 97% Wt Readings from Last 3 Encounters:  06/26/15 232 lb 8 oz (105.461 kg)  05/11/15 236 lb (107.049 kg)  04/30/15 236 lb (107.049 kg)     Lab Results  Component Value Date   WBC 7.7 03/05/2015   HGB 14.5 03/05/2015   HCT 42.8 03/05/2015   PLT 214.0 03/05/2015   GLUCOSE 90 03/05/2015   CHOL 171 03/05/2015   TRIG 130.0 03/05/2015   HDL 44.80 03/05/2015   LDLCALC 100* 03/05/2015   ALT 21 03/05/2015   AST 23 03/05/2015   NA 138 03/05/2015   K 4.0 03/05/2015   CL 103 03/05/2015   CREATININE 1.01 03/05/2015   BUN 21 03/05/2015   CO2 28 03/05/2015   TSH 3.03 03/05/2015   INR 2.5 06/08/2015   HGBA1C 5.9* 02/28/2011    Lab Results  Component Value Date   TSH 3.03 03/05/2015   Lab Results  Component Value Date   WBC 7.7 03/05/2015   HGB 14.5 03/05/2015   HCT 42.8 03/05/2015   MCV 92.2 03/05/2015   PLT 214.0  03/05/2015   Lab Results  Component Value Date   NA 138 03/05/2015   K 4.0 03/05/2015   CO2 28 03/05/2015   GLUCOSE 90 03/05/2015   BUN 21 03/05/2015   CREATININE 1.01 03/05/2015   BILITOT 0.5 03/05/2015   ALKPHOS 68 03/05/2015   AST 23 03/05/2015   ALT 21 03/05/2015   PROT 7.6 03/05/2015   ALBUMIN 3.8 03/05/2015   CALCIUM 9.6 03/05/2015   GFR 57.67* 03/05/2015   Lab Results  Component Value Date   CHOL 171 03/05/2015   Lab Results  Component Value Date   HDL 44.80 03/05/2015   Lab Results  Component Value Date   LDLCALC 100* 03/05/2015   Lab Results  Component Value Date   TRIG 130.0 03/05/2015   Lab Results  Component Value Date   CHOLHDL 4 03/05/2015   Lab Results  Component Value Date   HGBA1C 5.9* 02/28/2011       Assessment & Plan:   Problem List Items Addressed This Visit    Obesity    Encouraged DASH diet, decrease po intake and increase exercise as tolerated. Needs 7-8 hours of sleep nightly. Avoid trans fats, eat small, frequent meals every 4-5 hours with lean proteins, complex carbs and healthy fats. Minimize simple carbs, GMO foods.      Hyperlipidemia    Encouraged heart healthy diet, increase exercise, avoid trans fats, consider a krill oil cap daily      Essential hypertension    Well controlled, no changes to meds. Encouraged heart healthy diet such as the DASH diet and exercise as tolerated.       DEPRESSION/ANXIETY    Referred to behavioral health at this time.       Relevant Medications   escitalopram (LEXAPRO) 20 MG tablet  Other Visit Diagnoses    Encounter for immunization    -  Primary    Depression        Relevant Medications    escitalopram (LEXAPRO) 20 MG tablet    Other Relevant Orders    Ambulatory referral to [facility]       I have discontinued Ms. Nanna's esomeprazole and escitalopram. I am also having her start on escitalopram. Additionally, I am having her maintain her loperamide, multivitamin,  [facility], ammonium lactate, nitroGLYCERIN, InFLIXimab (REMICADE IV), hydrocortisone, metoprolol tartrate, amLODipine, warfarin, and losartan.  Meds ordered this encounter  Medications  . escitalopram (LEXAPRO) 20 MG tablet    Sig: Take 1 tablet (20 mg total) by mouth daily.    Dispense:  30 tablet    Refill:  3     Penni Homans, MD

## 2015-07-12 NOTE — Assessment & Plan Note (Signed)
Encouraged DASH diet, decrease po intake and increase exercise as tolerated. Needs 7-8 hours of sleep nightly. Avoid trans fats, eat small, frequent meals every 4-5 hours with lean proteins, complex carbs and healthy fats. Minimize simple carbs, GMO foods. 

## 2015-07-12 NOTE — Assessment & Plan Note (Addendum)
Referred to behavioral health at this time.

## 2015-07-12 NOTE — Assessment & Plan Note (Signed)
Well controlled, no changes to meds. Encouraged heart healthy diet such as the DASH diet and exercise as tolerated.  °

## 2015-07-12 NOTE — Assessment & Plan Note (Signed)
Encouraged heart healthy diet, increase exercise, avoid trans fats, consider a krill oil cap daily 

## 2015-07-20 ENCOUNTER — Ambulatory Visit (INDEPENDENT_AMBULATORY_CARE_PROVIDER_SITE_OTHER): Payer: Medicare Other | Admitting: Pharmacist Clinician (PhC)/ Clinical Pharmacy Specialist

## 2015-07-20 DIAGNOSIS — Z5181 Encounter for therapeutic drug level monitoring: Secondary | ICD-10-CM

## 2015-07-20 DIAGNOSIS — I480 Paroxysmal atrial fibrillation: Secondary | ICD-10-CM

## 2015-07-20 LAB — POCT INR: INR: 3.1

## 2015-08-03 ENCOUNTER — Other Ambulatory Visit: Payer: Self-pay | Admitting: Family Medicine

## 2015-08-03 DIAGNOSIS — Z1231 Encounter for screening mammogram for malignant neoplasm of breast: Secondary | ICD-10-CM

## 2015-08-04 DIAGNOSIS — Z85828 Personal history of other malignant neoplasm of skin: Secondary | ICD-10-CM | POA: Diagnosis not present

## 2015-08-04 DIAGNOSIS — R269 Unspecified abnormalities of gait and mobility: Secondary | ICD-10-CM | POA: Diagnosis not present

## 2015-08-04 DIAGNOSIS — M0589 Other rheumatoid arthritis with rheumatoid factor of multiple sites: Secondary | ICD-10-CM | POA: Diagnosis not present

## 2015-08-04 DIAGNOSIS — H209 Unspecified iridocyclitis: Secondary | ICD-10-CM | POA: Diagnosis not present

## 2015-08-04 DIAGNOSIS — K5090 Crohn's disease, unspecified, without complications: Secondary | ICD-10-CM | POA: Diagnosis not present

## 2015-08-04 DIAGNOSIS — M545 Low back pain: Secondary | ICD-10-CM | POA: Diagnosis not present

## 2015-08-04 DIAGNOSIS — Z79899 Other long term (current) drug therapy: Secondary | ICD-10-CM | POA: Diagnosis not present

## 2015-08-12 DIAGNOSIS — M0589 Other rheumatoid arthritis with rheumatoid factor of multiple sites: Secondary | ICD-10-CM | POA: Diagnosis not present

## 2015-08-12 DIAGNOSIS — Z79899 Other long term (current) drug therapy: Secondary | ICD-10-CM | POA: Diagnosis not present

## 2015-08-24 ENCOUNTER — Telehealth: Payer: Self-pay | Admitting: Family Medicine

## 2015-08-24 ENCOUNTER — Ambulatory Visit (HOSPITAL_BASED_OUTPATIENT_CLINIC_OR_DEPARTMENT_OTHER)
Admission: RE | Admit: 2015-08-24 | Discharge: 2015-08-24 | Disposition: A | Discharge status: Home / Self Care | Payer: Medicare Other | Source: Ambulatory Visit | Attending: Family Medicine | Admitting: Family Medicine

## 2015-08-24 DIAGNOSIS — Z1231 Encounter for screening mammogram for malignant neoplasm of breast: Secondary | ICD-10-CM | POA: Diagnosis not present

## 2015-08-24 NOTE — Telephone Encounter (Signed)
Error/gd °

## 2015-08-25 ENCOUNTER — Other Ambulatory Visit: Payer: Self-pay | Admitting: Cardiology

## 2015-08-26 ENCOUNTER — Other Ambulatory Visit: Payer: Self-pay | Admitting: Cardiology

## 2015-08-26 ENCOUNTER — Other Ambulatory Visit: Payer: Self-pay | Admitting: *Deleted

## 2015-08-26 MED ORDER — AMLODIPINE BESYLATE 10 MG PO TABS: ORAL_TABLET | ORAL | Status: DC

## 2015-08-26 MED ORDER — DOFETILIDE 125 MCG PO CAPS: 375.0000 ug | ORAL_CAPSULE | Freq: Two times a day (BID) | ORAL | Status: DC

## 2015-08-27 ENCOUNTER — Other Ambulatory Visit: Payer: Self-pay | Admitting: Cardiology

## 2015-08-31 ENCOUNTER — Ambulatory Visit (INDEPENDENT_AMBULATORY_CARE_PROVIDER_SITE_OTHER): Payer: Medicare Other | Admitting: Family Medicine

## 2015-08-31 ENCOUNTER — Encounter: Payer: Self-pay | Admitting: Family Medicine

## 2015-08-31 VITALS — BP 118/82 | HR 48 | Temp 98.0°F | Ht 62.5 in | Wt 239.2 lb

## 2015-08-31 DIAGNOSIS — I4891 Unspecified atrial fibrillation: Secondary | ICD-10-CM | POA: Diagnosis not present

## 2015-08-31 DIAGNOSIS — E669 Obesity, unspecified: Secondary | ICD-10-CM | POA: Diagnosis not present

## 2015-08-31 DIAGNOSIS — I2510 Atherosclerotic heart disease of native coronary artery without angina pectoris: Secondary | ICD-10-CM

## 2015-08-31 DIAGNOSIS — E782 Mixed hyperlipidemia: Secondary | ICD-10-CM | POA: Diagnosis not present

## 2015-08-31 DIAGNOSIS — I10 Essential (primary) hypertension: Secondary | ICD-10-CM

## 2015-08-31 DIAGNOSIS — R739 Hyperglycemia, unspecified: Secondary | ICD-10-CM

## 2015-08-31 DIAGNOSIS — K219 Gastro-esophageal reflux disease without esophagitis: Secondary | ICD-10-CM

## 2015-08-31 LAB — LIPID PANEL
CHOL/HDL RATIO: 4
Cholesterol: 170 mg/dL (ref 0–200)
HDL: 47.1 mg/dL (ref >39.00)
LDL CALC: 103 mg/dL — AB (ref 0–99)
NonHDL: 123.36
TRIGLYCERIDES: 104 mg/dL (ref 0.0–149.0)
VLDL: 20.8 mg/dL (ref 0.0–40.0)

## 2015-08-31 LAB — CBC
HCT: 43.3 % (ref 36.0–46.0)
Hemoglobin: 14.2 g/dL (ref 12.0–15.0)
MCHC: 32.7 g/dL (ref 30.0–36.0)
MCV: 93.5 fl (ref 78.0–100.0)
Platelets: 216 10*3/uL (ref 150.0–400.0)
RBC: 4.63 Mil/uL (ref 3.87–5.11)
RDW: 14.7 % (ref 11.5–15.5)
WBC: 7.5 10*3/uL (ref 4.0–10.5)

## 2015-08-31 LAB — COMPREHENSIVE METABOLIC PANEL
ALBUMIN: 3.8 g/dL (ref 3.5–5.2)
ALT: 18 U/L (ref 0–35)
AST: 21 U/L (ref 0–37)
Alkaline Phosphatase: 67 U/L (ref 39–117)
BILIRUBIN TOTAL: 0.6 mg/dL (ref 0.2–1.2)
BUN: 21 mg/dL (ref 6–23)
CHLORIDE: 105 meq/L (ref 96–112)
CO2: 26 meq/L (ref 19–32)
Calcium: 9.3 mg/dL (ref 8.4–10.5)
Creatinine, Ser: 1.01 mg/dL (ref 0.40–1.20)
GFR: 57.59 mL/min — ABNORMAL LOW (ref >60.00)
Glucose, Bld: 92 mg/dL (ref 70–99)
POTASSIUM: 4.1 meq/L (ref 3.5–5.1)
SODIUM: 139 meq/L (ref 135–145)
TOTAL PROTEIN: 7.3 g/dL (ref 6.0–8.3)

## 2015-08-31 LAB — PROTIME-INR
INR: 2.2 ratio — ABNORMAL HIGH (ref 0.8–1.0)
Prothrombin Time: 23.6 s — ABNORMAL HIGH (ref 9.6–13.1)

## 2015-08-31 LAB — TSH: TSH: 2.42 u[IU]/mL (ref 0.35–4.50)

## 2015-08-31 NOTE — Patient Instructions (Signed)
Hypertension Hypertension, commonly called high blood pressure, is when the force of blood pumping through your arteries is too strong. Your arteries are the blood vessels that carry blood from your heart throughout your body. A blood pressure reading consists of a higher number over a lower number, such as 110/72. The higher number (systolic) is the pressure inside your arteries when your heart pumps. The lower number (diastolic) is the pressure inside your arteries when your heart relaxes. Ideally you want your blood pressure below 120/80. Hypertension forces your heart to work harder to pump blood. Your arteries may become narrow or stiff. Having untreated or uncontrolled hypertension can cause heart attack, stroke, kidney disease, and other problems. RISK FACTORS Some risk factors for high blood pressure are controllable. Others are not.  Risk factors you cannot control include:   Race. You may be at higher risk if you are African American.  Age. Risk increases with age.  Gender. Men are at higher risk than women before age 45 years. After age 65, women are at higher risk than men. Risk factors you can control include:  Not getting enough exercise or physical activity.  Being overweight.  Getting too much fat, sugar, calories, or salt in your diet.  Drinking too much alcohol. SIGNS AND SYMPTOMS Hypertension does not usually cause signs or symptoms. Extremely high blood pressure (hypertensive crisis) may cause headache, anxiety, shortness of breath, and nosebleed. DIAGNOSIS To check if you have hypertension, your health care provider will measure your blood pressure while you are seated, with your arm held at the level of your heart. It should be measured at least twice using the same arm. Certain conditions can cause a difference in blood pressure between your right and left arms. A blood pressure reading that is higher than normal on one occasion does not mean that you need treatment. If  it is not clear whether you have high blood pressure, you may be asked to return on a different day to have your blood pressure checked again. Or, you may be asked to monitor your blood pressure at home for 1 or more weeks. TREATMENT Treating high blood pressure includes making lifestyle changes and possibly taking medicine. Living a healthy lifestyle can help lower high blood pressure. You may need to change some of your habits. Lifestyle changes may include:  Following the DASH diet. This diet is high in fruits, vegetables, and whole grains. It is low in salt, red meat, and added sugars.  Keep your sodium intake below 2,300 mg per day.  Getting at least 30-45 minutes of aerobic exercise at least 4 times per week.  Losing weight if necessary.  Not smoking.  Limiting alcoholic beverages.  Learning ways to reduce stress. Your health care provider may prescribe medicine if lifestyle changes are not enough to get your blood pressure under control, and if one of the following is true:  You are 18-59 years of age and your systolic blood pressure is above 140.  You are 60 years of age or older, and your systolic blood pressure is above 150.  Your diastolic blood pressure is above 90.  You have diabetes, and your systolic blood pressure is over 140 or your diastolic blood pressure is over 90.  You have kidney disease and your blood pressure is above 140/90.  You have heart disease and your blood pressure is above 140/90. Your personal target blood pressure may vary depending on your medical conditions, your age, and other factors. HOME CARE INSTRUCTIONS    Have your blood pressure rechecked as directed by your health care provider.   Take medicines only as directed by your health care provider. Follow the directions carefully. Blood pressure medicines must be taken as prescribed. The medicine does not work as well when you skip doses. Skipping doses also puts you at risk for  problems.  Do not smoke.   Monitor your blood pressure at home as directed by your health care provider. SEEK MEDICAL CARE IF:   You think you are having a reaction to medicines taken.  You have recurrent headaches or feel dizzy.  You have swelling in your ankles.  You have trouble with your vision. SEEK IMMEDIATE MEDICAL CARE IF:  You develop a severe headache or confusion.  You have unusual weakness, numbness, or feel faint.  You have severe chest or abdominal pain.  You vomit repeatedly.  You have trouble breathing. MAKE SURE YOU:   Understand these instructions.  Will watch your condition.  Will get help right away if you are not doing well or get worse.   This information is not intended to replace advice given to you by your health care provider. Make sure you discuss any questions you have with your health care provider.   Document Released: 10/10/2005 Document Revised: 02/24/2015 Document Reviewed: 08/02/2013 Elsevier Interactive Patient Education 2016 Elsevier Inc.  

## 2015-09-06 NOTE — Assessment & Plan Note (Signed)
Rate controlled and tolerating coumadin 

## 2015-09-06 NOTE — Assessment & Plan Note (Signed)
Well controlled, no changes to meds. Encouraged heart healthy diet such as the DASH diet and exercise as tolerated.  °

## 2015-09-06 NOTE — Assessment & Plan Note (Signed)
Avoid offending foods, start probiotics. Do not eat large meals in late evening and consider raising head of bed.  

## 2015-09-06 NOTE — Assessment & Plan Note (Signed)
Encouraged DASH diet, decrease po intake and increase exercise as tolerated. Needs 7-8 hours of sleep nightly. Avoid trans fats, eat small, frequent meals every 4-5 hours with lean proteins, complex carbs and healthy fats. Minimize simple carbs, GMO foods. 

## 2015-09-06 NOTE — Assessment & Plan Note (Signed)
minimize simple carbs. Increase exercise as tolerated.  

## 2015-09-06 NOTE — Progress Notes (Signed)
Subjective:    Patient ID: [name], female    DOB: 1946-03-02, 69 y.o.   MRN: [id number]  Chief Complaint  Patient presents with  . Follow-up    HPI Patient is in today for ollow-up. She is struggling with significant stress. Over the last several years she has lost numerous people very close to her including her mother's sister and a brother. This has led to some increased anxiety and depression. No suicidal ideation but anhedonia is noted. She is on Remicade for her Crohn's and they have adjusted her dose which seems to be helping her rheumatoid arthritis. No fevers or chills. No symptoms of acute illness. Denies CP/palp/SOB/HA/congestion/fevers/GI or GU c/o. Taking meds as prescribed  Past Medical History  Diagnosis Date  . CAD (coronary artery disease)   . Depression   . GERD (gastroesophageal reflux disease)   . Hyperlipidemia   . Hypertension   . Crohn's colitis ([place])     she reports ulcerative colitis beginning in her 55's, biopsies 2008 suggest Crohn's not UC  . 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 Pancreatitis   . Chronic sinusitis   . Renal oncocytoma     s/p right nephrectomy  . Myocardial infarction ([place])   . Uveitis   . Anxiety   . Obesity   . Chronic cough   . OSA (obstructive sleep apnea)   . Paroxysmal atrial fibrillation ([facility])   . Arthritis   . IBS (irritable bowel syndrome)   . Blood transfusion without reported diagnosis 1976  . Sleep apnea   . Keloid of skin   . Medicare annual wellness visit, subsequent 08/17/2012    Past Surgical History  Procedure Laterality Date  . Nephrectomy  10/2006    right secondary to oncocytoma   . Breast biopsy  1996    benign lesion   . Cholecystectomy  10/2006  . Tubal ligation    . Right renal artery repair  1976  . Right femoral  pseudoaneurysm & right groin hematoma evacuation  02/2007  . Colonoscopy w/ biopsies    . Esophagogastroduodenoscopy    . Coronary angioplasty    . Cataract  surgery Left 11/13/14    Family History  Problem Relation Age of Onset  . Colitis Father   . Hypertension Mother   . Coronary artery disease Mother   . Stroke Mother   . Stroke Brother   . Colon cancer Paternal Aunt   . Clotting disorder Mother   . Clotting disorder Brother   . Clotting disorder Sister   . Heart disease Mother   . Heart disease Father   . Asthma Daughter   . Allergies Mother   . Asthma Maternal Uncle   . Colon cancer Paternal Aunt   . Colon polyps Neg Hx   . Kidney disease Neg Hx     Social History   Social History  . Marital Status: Married    Spouse Name: N/A  . Number of Children: 3  . Years of Education: N/A   Occupational History  . Retired     retired   Social History Main Topics  . Smoking status: Never Smoker   . Smokeless tobacco: Never Used  . Alcohol Use: No  . Drug Use: No  . Sexual Activity: Not Currently   Other Topics Concern  . 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 amLODipine (NORVASC) 10 MG tablet TAKE 1 TABLET (10 MG TOTAL) BY MOUTH DAILY. 30 tablet 6  . ammonium lactate (LAC-HYDRIN) 12 % lotion Apply 1 application topically daily as needed. For dry skin    . dofetilide (TIKOSYN) 125 MCG capsule Take 3 capsules (375 mcg total) by mouth 2 (two) times daily. 180 capsule 1  . hydrocortisone (ANUSOL-HC) 25 MG suppository Place 1 suppository (25 mg total) rectally at bedtime as needed for hemorrhoids or itching. 12 suppository 2  . InFLIXimab (REMICADE IV) Inject into the vein as directed.    . loperamide (IMODIUM A-D) 2 MG tablet Take 1 mg by mouth daily as needed. Dihrrhea    . losartan (COZAAR) 100 MG tablet TAKE 1 TABLET (100 MG TOTAL) BY MOUTH DAILY. 30 tablet 9  . metoprolol tartrate (LOPRESSOR) 25 MG tablet Take  1 tablet (25 mg total) by mouth 2 (two) times daily. 180 tablet 3  . Multiple Vitamin (MULTIVITAMIN) capsule Take 1 capsule by mouth daily. 1 daily      . nitroGLYCERIN (NITROSTAT) 0.4 MG SL tablet Place 1 tablet (0.4 mg total) under the tongue every 5 (five) minutes as needed. 1 tab under tongue every 5 min for chest pain not to exceed 3 tabs in 15 min 25 tablet 3  . Probiotic Product ([place]) CAPS Take 1 capsule by mouth daily. 1 per day    . warfarin (COUMADIN) 2.5 MG tablet Take 1-1.5 tablets by mouth daily as directed by coumadin clinic 40 tablet 5  . escitalopram (LEXAPRO) 20 MG tablet Take 1 tablet (20 mg total) by mouth daily. 30 tablet 3   No facility-administered medications prior to visit.    Allergies  Allergen Reactions  . Cefdinir     Pt cannot take; may interfere with AFib.  . Codeine     Heart beats fast   . Guaifenesin Er Nausea And Vomiting and Other (See Comments)    Headaches; can tolerate liquid.  . Latex     Breathing problems  . Mucilgen [Psyllium]   . Percocet [Oxycodone-Acetaminophen]     Itching & swelling in jaw    Review of Systems  Constitutional: Negative for fever and malaise/fatigue.  HENT: Negative for congestion.   Eyes: Negative for discharge.  Respiratory: Negative for shortness of breath.   Cardiovascular: Negative for chest pain, palpitations and leg swelling.  Gastrointestinal: Negative for nausea and abdominal pain.  Genitourinary: Negative for dysuria.  Musculoskeletal: Negative for falls.  Skin: Negative for rash.  Neurological: Negative for loss of consciousness and headaches.  Endo/Heme/Allergies: Negative for environmental allergies.  Psychiatric/Behavioral: Positive for depression. The patient is not nervous/anxious.        Objective:    Physical Exam  Constitutional: She is oriented to person, place, and time. She appears well-developed and well-nourished. No distress.  HENT:  Head: Normocephalic and  atraumatic.  Nose: Nose normal.  Eyes: Right eye exhibits no discharge. Left eye exhibits no discharge.  Neck: Normal range of motion. Neck supple.  Cardiovascular: Normal rate and regular rhythm.   No murmur heard. Pulmonary/Chest: Effort normal and breath sounds normal.  Abdominal: Soft. Bowel sounds are normal. There is no tenderness.  Musculoskeletal: She exhibits no edema.  Neurological: She is alert and oriented to person, place, and time.  Skin: Skin is warm and dry.  Psychiatric: She  has a normal mood and affect.  Nursing note and vitals reviewed.   BP 118/82 mmHg  Pulse 48  Temp(Src) 98 F (36.7 C) (Oral)  Ht 5' 2.5" (1.588 m)  Wt 239 lb 4 oz (108.523 kg)  BMI 43.03 kg/m2  SpO2 95% Wt Readings from Last 3 Encounters:  08/31/15 239 lb 4 oz (108.523 kg)  06/26/15 232 lb 8 oz (105.461 kg)  05/11/15 236 lb (107.049 kg)     Lab Results  Component Value Date   WBC 7.5 08/31/2015   HGB 14.2 08/31/2015   HCT 43.3 08/31/2015   PLT 216.0 08/31/2015   GLUCOSE 92 08/31/2015   CHOL 170 08/31/2015   TRIG 104.0 08/31/2015   HDL 47.10 08/31/2015   LDLCALC 103* 08/31/2015   ALT 18 08/31/2015   AST 21 08/31/2015   NA 139 08/31/2015   K 4.1 08/31/2015   CL 105 08/31/2015   CREATININE 1.01 08/31/2015   BUN 21 08/31/2015   CO2 26 08/31/2015   TSH 2.42 08/31/2015   INR 2.2* 08/31/2015   HGBA1C 5.9* 02/28/2011    Lab Results  Component Value Date   TSH 2.42 08/31/2015   Lab Results  Component Value Date   WBC 7.5 08/31/2015   HGB 14.2 08/31/2015   HCT 43.3 08/31/2015   MCV 93.5 08/31/2015   PLT 216.0 08/31/2015   Lab Results  Component Value Date   NA 139 08/31/2015   K 4.1 08/31/2015   CO2 26 08/31/2015   GLUCOSE 92 08/31/2015   BUN 21 08/31/2015   CREATININE 1.01 08/31/2015   BILITOT 0.6 08/31/2015   ALKPHOS 67 08/31/2015   AST 21 08/31/2015   ALT 18 08/31/2015   PROT 7.3 08/31/2015   ALBUMIN 3.8 08/31/2015   CALCIUM 9.3 08/31/2015   GFR 57.59*  08/31/2015   Lab Results  Component Value Date   CHOL 170 08/31/2015   Lab Results  Component Value Date   HDL 47.10 08/31/2015   Lab Results  Component Value Date   LDLCALC 103* 08/31/2015   Lab Results  Component Value Date   TRIG 104.0 08/31/2015   Lab Results  Component Value Date   CHOLHDL 4 08/31/2015   Lab Results  Component Value Date   HGBA1C 5.9* 02/28/2011       Assessment & Plan:   Problem List Items Addressed This Visit    ATRIAL FIBRILLATION - Primary    Rate controlled and tolerating coumadin      Relevant Orders   INR/PT (Completed)   TSH (Completed)   CBC (Completed)   Comprehensive metabolic panel (Completed)   Lipid panel (Completed)   Essential hypertension    Well controlled, no changes to meds. Encouraged heart healthy diet such as the DASH diet and exercise as tolerated.       Relevant Orders   INR/PT (Completed)   TSH (Completed)   CBC (Completed)   Comprehensive metabolic panel (Completed)   Lipid panel (Completed)   GERD    Avoid offending foods, start probiotics. Do not eat large meals in late evening and consider raising head of bed.       Hyperglycemia     minimize simple carbs. Increase exercise as tolerated.      Obesity    Encouraged DASH diet, decrease po intake and increase exercise as tolerated. Needs 7-8 hours of sleep nightly. Avoid trans fats, eat small, frequent meals every 4-5 hours with lean proteins, complex carbs and healthy fats. Minimize simple carbs, GMO  foods.       Other Visit Diagnoses    Hyperlipidemia, mixed        Relevant Orders    INR/PT (Completed)    TSH (Completed)    CBC (Completed)    Comprehensive metabolic panel (Completed)    Lipid panel (Completed)       I am having Ms. Pence maintain her loperamide, multivitamin, [facility], ammonium lactate, nitroGLYCERIN, InFLIXimab (REMICADE IV), hydrocortisone, metoprolol tartrate, warfarin, losartan, dofetilide, amLODipine, and  escitalopram.  Meds ordered this encounter  Medications  . escitalopram (LEXAPRO) 10 MG tablet    Sig: Take 10 mg by mouth daily.     Penni Homans, MD

## 2015-09-24 ENCOUNTER — Other Ambulatory Visit: Payer: Self-pay | Admitting: Cardiology

## 2015-09-24 MED ORDER — AMLODIPINE BESYLATE 10 MG PO TABS: ORAL_TABLET | ORAL | Status: DC

## 2015-09-30 ENCOUNTER — Ambulatory Visit (INDEPENDENT_AMBULATORY_CARE_PROVIDER_SITE_OTHER): Payer: Medicare Other

## 2015-09-30 DIAGNOSIS — Z5181 Encounter for therapeutic drug level monitoring: Secondary | ICD-10-CM | POA: Diagnosis not present

## 2015-09-30 DIAGNOSIS — I4891 Unspecified atrial fibrillation: Secondary | ICD-10-CM

## 2015-09-30 LAB — POCT INR: INR: 2.5

## 2015-09-30 NOTE — Progress Notes (Signed)
Pre visit review using our clinic review tool, if applicable. No additional management support is needed unless otherwise documented below in the visit note.  Patient in for INR check  INR= 2.5 Goal 2.0-3.0

## 2015-09-30 NOTE — Progress Notes (Signed)
Reviewed

## 2015-09-30 NOTE — Patient Instructions (Signed)
Take  1 tablet daily except 1.5 tablets on Tuesdays and Saturdays.  Recheck INR in 6 weeks.

## 2015-10-05 DIAGNOSIS — M0589 Other rheumatoid arthritis with rheumatoid factor of multiple sites: Secondary | ICD-10-CM | POA: Diagnosis not present

## 2015-10-06 DIAGNOSIS — H35342 Macular cyst, hole, or pseudohole, left eye: Secondary | ICD-10-CM | POA: Diagnosis not present

## 2015-10-06 DIAGNOSIS — H524 Presbyopia: Secondary | ICD-10-CM | POA: Diagnosis not present

## 2015-10-06 DIAGNOSIS — H25811 Combined forms of age-related cataract, right eye: Secondary | ICD-10-CM | POA: Diagnosis not present

## 2015-10-06 DIAGNOSIS — H26492 Other secondary cataract, left eye: Secondary | ICD-10-CM | POA: Diagnosis not present

## 2015-10-06 NOTE — Progress Notes (Signed)
RN note reviewed. Agree with documention and plan. 

## 2015-10-21 ENCOUNTER — Other Ambulatory Visit: Payer: Self-pay | Admitting: Cardiology

## 2015-10-21 MED ORDER — DOFETILIDE 125 MCG PO CAPS: ORAL_CAPSULE | ORAL | Status: DC

## 2015-10-21 NOTE — Telephone Encounter (Signed)
Rx request sent to pharmacy.  

## 2015-11-04 DIAGNOSIS — R269 Unspecified abnormalities of gait and mobility: Secondary | ICD-10-CM | POA: Diagnosis not present

## 2015-11-04 DIAGNOSIS — M545 Low back pain: Secondary | ICD-10-CM | POA: Diagnosis not present

## 2015-11-04 DIAGNOSIS — Z85828 Personal history of other malignant neoplasm of skin: Secondary | ICD-10-CM | POA: Diagnosis not present

## 2015-11-04 DIAGNOSIS — K5090 Crohn's disease, unspecified, without complications: Secondary | ICD-10-CM | POA: Diagnosis not present

## 2015-11-04 DIAGNOSIS — H209 Unspecified iridocyclitis: Secondary | ICD-10-CM | POA: Diagnosis not present

## 2015-11-04 DIAGNOSIS — Z79899 Other long term (current) drug therapy: Secondary | ICD-10-CM | POA: Diagnosis not present

## 2015-11-04 DIAGNOSIS — M0589 Other rheumatoid arthritis with rheumatoid factor of multiple sites: Secondary | ICD-10-CM | POA: Diagnosis not present

## 2015-11-04 DIAGNOSIS — Q600 Renal agenesis, unilateral: Secondary | ICD-10-CM | POA: Diagnosis not present

## 2015-11-11 ENCOUNTER — Ambulatory Visit (INDEPENDENT_AMBULATORY_CARE_PROVIDER_SITE_OTHER): Payer: Medicare Other | Admitting: Behavioral Health

## 2015-11-11 DIAGNOSIS — Z5181 Encounter for therapeutic drug level monitoring: Secondary | ICD-10-CM | POA: Diagnosis not present

## 2015-11-11 DIAGNOSIS — I4891 Unspecified atrial fibrillation: Secondary | ICD-10-CM | POA: Diagnosis not present

## 2015-11-11 LAB — POCT INR: INR: 2.8

## 2015-11-11 NOTE — Progress Notes (Signed)
Pre visit review using our clinic review tool, if applicable. No additional management support is needed unless otherwise documented below in the visit note. 

## 2015-11-11 NOTE — Patient Instructions (Signed)
Per Dr. Larose Kells: Continue taking 1 tablet daily, except 1.5 tablets on Tuesdays and Saturdays.  Recheck INR in 4 weeks.

## 2015-11-13 ENCOUNTER — Ambulatory Visit (HOSPITAL_COMMUNITY)
Admission: RE | Admit: 2015-11-13 | Discharge: 2015-11-13 | Disposition: A | Discharge status: Home / Self Care | Payer: Medicare Other | Source: Ambulatory Visit | Attending: Nurse Practitioner | Admitting: Nurse Practitioner

## 2015-11-13 ENCOUNTER — Telehealth: Payer: Self-pay | Admitting: Cardiology

## 2015-11-13 ENCOUNTER — Encounter (HOSPITAL_COMMUNITY): Payer: Self-pay | Admitting: Nurse Practitioner

## 2015-11-13 VITALS — BP 134/86 | HR 102 | Ht 63.0 in | Wt 239.2 lb

## 2015-11-13 DIAGNOSIS — I252 Old myocardial infarction: Secondary | ICD-10-CM | POA: Insufficient documentation

## 2015-11-13 DIAGNOSIS — Z6841 Body Mass Index (BMI) 40.0 and over, adult: Secondary | ICD-10-CM | POA: Insufficient documentation

## 2015-11-13 DIAGNOSIS — Z91040 Latex allergy status: Secondary | ICD-10-CM | POA: Diagnosis not present

## 2015-11-13 DIAGNOSIS — I4891 Unspecified atrial fibrillation: Secondary | ICD-10-CM | POA: Insufficient documentation

## 2015-11-13 DIAGNOSIS — K589 Irritable bowel syndrome without diarrhea: Secondary | ICD-10-CM | POA: Insufficient documentation

## 2015-11-13 DIAGNOSIS — E785 Hyperlipidemia, unspecified: Secondary | ICD-10-CM | POA: Insufficient documentation

## 2015-11-13 DIAGNOSIS — F329 Major depressive disorder, single episode, unspecified: Secondary | ICD-10-CM | POA: Insufficient documentation

## 2015-11-13 DIAGNOSIS — Z79899 Other long term (current) drug therapy: Secondary | ICD-10-CM | POA: Diagnosis not present

## 2015-11-13 DIAGNOSIS — I480 Paroxysmal atrial fibrillation: Secondary | ICD-10-CM

## 2015-11-13 DIAGNOSIS — G4733 Obstructive sleep apnea (adult) (pediatric): Secondary | ICD-10-CM | POA: Insufficient documentation

## 2015-11-13 DIAGNOSIS — Z881 Allergy status to other antibiotic agents status: Secondary | ICD-10-CM | POA: Insufficient documentation

## 2015-11-13 DIAGNOSIS — Z7901 Long term (current) use of anticoagulants: Secondary | ICD-10-CM | POA: Insufficient documentation

## 2015-11-13 DIAGNOSIS — Z9861 Coronary angioplasty status: Secondary | ICD-10-CM | POA: Insufficient documentation

## 2015-11-13 DIAGNOSIS — Z8249 Family history of ischemic heart disease and other diseases of the circulatory system: Secondary | ICD-10-CM | POA: Diagnosis not present

## 2015-11-13 DIAGNOSIS — K219 Gastro-esophageal reflux disease without esophagitis: Secondary | ICD-10-CM | POA: Insufficient documentation

## 2015-11-13 DIAGNOSIS — Z885 Allergy status to narcotic agent status: Secondary | ICD-10-CM | POA: Diagnosis not present

## 2015-11-13 DIAGNOSIS — E669 Obesity, unspecified: Secondary | ICD-10-CM | POA: Insufficient documentation

## 2015-11-13 DIAGNOSIS — I2510 Atherosclerotic heart disease of native coronary artery without angina pectoris: Secondary | ICD-10-CM | POA: Insufficient documentation

## 2015-11-13 DIAGNOSIS — Z823 Family history of stroke: Secondary | ICD-10-CM | POA: Insufficient documentation

## 2015-11-13 DIAGNOSIS — I10 Essential (primary) hypertension: Secondary | ICD-10-CM | POA: Diagnosis not present

## 2015-11-13 LAB — BASIC METABOLIC PANEL
Anion gap: 11 (ref 5–15)
BUN: 13 mg/dL (ref 6–20)
CO2: 26 mmol/L (ref 22–32)
Calcium: 9.3 mg/dL (ref 8.9–10.3)
Chloride: 105 mmol/L (ref 101–111)
Creatinine, Ser: 1.03 mg/dL — ABNORMAL HIGH (ref 0.44–1.00)
GFR calc Af Amer: >60 mL/min (ref >60)
GFR calc non Af Amer: 54 mL/min — ABNORMAL LOW (ref >60)
Glucose, Bld: 101 mg/dL — ABNORMAL HIGH (ref 65–99)
Potassium: 4.1 mmol/L (ref 3.5–5.1)
Sodium: 142 mmol/L (ref 135–145)

## 2015-11-13 LAB — CBC
HCT: 45.4 % (ref 36.0–46.0)
Hemoglobin: 15.3 g/dL — ABNORMAL HIGH (ref 12.0–15.0)
MCH: 31.9 pg (ref 26.0–34.0)
MCHC: 33.7 g/dL (ref 30.0–36.0)
MCV: 94.8 fL (ref 78.0–100.0)
Platelets: 230 10*3/uL (ref 150–400)
RBC: 4.79 MIL/uL (ref 3.87–5.11)
RDW: 13.3 % (ref 11.5–15.5)
WBC: 8.7 10*3/uL (ref 4.0–10.5)

## 2015-11-13 LAB — MAGNESIUM: Magnesium: 2 mg/dL (ref 1.7–2.4)

## 2015-11-13 LAB — TSH: TSH: 3.891 u[IU]/mL (ref 0.350–4.500)

## 2015-11-13 NOTE — Progress Notes (Signed)
Patient ID: [name], female   DOB: 1946/05/03, 70 y.o.   MRN: [id number]     Primary Care Physician: Penni Homans, MD Referring Physician: Juanda Bond Triage Cardiologist: Dr. Orlan Leavens [name] is a 70 y.o. female with a h/o afib on tikosyn x 3 years that usually has a break through episode one x a year. She went into afib last night at 7pm. She denies any change in health, no change in medicines. Wears cpap regularly and has not missed any doses of tikosyn . She took an extra 1/2 tab of metoprolol this am.States she used amiodarone in the past but had visual hallucinations with drug. Has never had an ablation but is not interested at this point and overall her afib burden has been low on tikosyn. She feels fatigue but is tolerating afib well.   Today, she denies symptoms of palpitations, chest pain, shortness of breath, orthopnea, PND, lower extremity edema, dizziness, presyncope, syncope, or neurologic sequela. The patient is tolerating medications without difficulties and is otherwise without complaint today.   Past Medical History  Diagnosis Date  . CAD (coronary artery disease)   . Depression   . GERD (gastroesophageal reflux disease)   . Hyperlipidemia   . Hypertension   . Crohn's colitis ([place])     she reports ulcerative colitis beginning in her 36's, biopsies 2008 suggest Crohn's not UC  . Pseudoaneurysm of right femoral artery ([place])   . Pancreatitis   . Chronic sinusitis   . Renal oncocytoma     s/p right nephrectomy  . Myocardial infarction ([place])   . Uveitis   . Anxiety   . Obesity   . Chronic cough   . OSA (obstructive sleep apnea)   . Paroxysmal atrial fibrillation ([facility])   . Arthritis   . IBS (irritable bowel syndrome)   . Blood transfusion without reported diagnosis 1976  . Sleep apnea   . Keloid of skin   . Medicare annual wellness visit, subsequent 08/17/2012   Past Surgical History  Procedure Laterality Date  . Nephrectomy  10/2006    right  secondary to oncocytoma   . Breast biopsy  1996    benign lesion   . Cholecystectomy  10/2006  . Tubal ligation    . Right renal artery repair  1976  . Right femoral  pseudoaneurysm & right groin hematoma evacuation  02/2007  . Colonoscopy w/ biopsies    . Esophagogastroduodenoscopy    . Coronary angioplasty    . Cataract surgery Left 11/13/14    Current Outpatient Prescriptions  Medication Sig Dispense Refill  . amLODipine (NORVASC) 10 MG tablet TAKE 1 TABLET (10 MG TOTAL) BY MOUTH DAILY. 30 tablet 3  . ammonium lactate (LAC-HYDRIN) 12 % lotion Apply 1 application topically daily as needed. For dry skin    . dofetilide (TIKOSYN) 125 MCG capsule TAKE 3 CAPSULES (375 MCG TOTAL) BY MOUTH 2 TIMES DAILY. *Please schedule appt for more refills.* 180 capsule 0  . escitalopram (LEXAPRO) 10 MG tablet Take 10 mg by mouth daily.    . hydrocortisone (ANUSOL-HC) 25 MG suppository Place 1 suppository (25 mg total) rectally at bedtime as needed for hemorrhoids or itching. 12 suppository 2  . InFLIXimab (REMICADE IV) Inject into the vein as directed.    . loperamide (IMODIUM A-D) 2 MG tablet Take 1 mg by mouth daily as needed. Dihrrhea    . losartan (COZAAR) 100 MG tablet TAKE 1 TABLET (100 MG TOTAL) BY MOUTH DAILY.  30 tablet 9  . metoprolol tartrate (LOPRESSOR) 25 MG tablet Take 1 tablet (25 mg total) by mouth 2 (two) times daily. 180 tablet 3  . Multiple Vitamin (MULTIVITAMIN) capsule Take 1 capsule by mouth daily. 1 daily      . nitroGLYCERIN (NITROSTAT) 0.4 MG SL tablet Place 1 tablet (0.4 mg total) under the tongue every 5 (five) minutes as needed. 1 tab under tongue every 5 min for chest pain not to exceed 3 tabs in 15 min 25 tablet 3  . Probiotic Product ([place]) CAPS Take 1 capsule by mouth daily. 1 per day    . warfarin (COUMADIN) 2.5 MG tablet Take 1-1.5 tablets by mouth daily as directed by coumadin clinic 40 tablet 5   No current facility-administered medications for this  encounter.    Allergies  Allergen Reactions  . Cefdinir     Pt cannot take; may interfere with AFib.  . Codeine     Heart beats fast   . Guaifenesin Er Nausea And Vomiting and Other (See Comments)    Headaches; can tolerate liquid.  . Latex     Breathing problems  . Mucilgen [Psyllium]   . Percocet [Oxycodone-Acetaminophen]     Itching & swelling in jaw    Social History   Social History  . Marital Status: Married    Spouse Name: N/A  . Number of Children: 3  . Years of Education: N/A   Occupational History  . Retired     retired   Social History Main Topics  . Smoking status: Never Smoker   . Smokeless tobacco: Never Used  . Alcohol Use: No  . Drug Use: No  . Sexual Activity: Not Currently   Other Topics Concern  . 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 Colitis Father   . Hypertension Mother   . Coronary artery disease Mother   . Stroke Mother   . Stroke Brother   . Colon cancer Paternal Aunt   . Clotting disorder Mother   . Clotting disorder Brother   . Clotting disorder Sister   . Heart disease Mother   . Heart disease Father   . Asthma Daughter   . Allergies Mother   . Asthma Maternal Uncle   . Colon cancer Paternal Aunt   . Colon polyps Neg Hx   . Kidney disease Neg Hx     ROS- All systems are reviewed and negative except as per the HPI above  Physical Exam: Filed Vitals:   11/13/15 0942  BP: 134/86  Pulse: 102  Height: 5\' 3"  (1.6 m)  Weight: 239 lb 3.2 oz (108.5 kg)    GEN- The patient is well appearing, alert and oriented x 3 today.   Head- normocephalic, atraumatic Eyes-  Sclera clear, conjunctiva pink Ears- hearing intact Oropharynx- clear Neck- supple, no JVP Lymph- no cervical lymphadenopathy Lungs- Clear to ausculation bilaterally,  normal work of breathing Heart- Regular rate and rhythm, no murmurs, rubs or gallops, PMI not laterally displaced GI- soft, NT, ND, + BS Extremities- no clubbing, cyanosis, or edema MS- no significant deformity or atrophy Skin- no rash or lesion Psych- euthymic mood, full affect Neuro- strength and sensation are intact  EKG-Afib with v rate 102 bpm, qrs int  86 ms, qtc 492 ms Epic records reviewed    Assessment and Plan: 1. afib Maintaining SR except for breakthrough last night, continues in afib. Last breakthrough one year ago and self corrected Continue tikosyn, metoprolol No triggers, change in health, meds Continue warfarin, last INR 2.8 this past Wednesday Can take extra metoprolol if needed if heart rate over 100 Bmet, mag, tsh, cbc today  If has not returned to [place] by Monday, she will call the office and will be set up for cardioversion  [place]. [place], [facility] [address] [place], [place] [address] [phone]

## 2015-11-13 NOTE — Patient Instructions (Signed)
Call [facility] on Monday to report rhythm over weekend. [phone]

## 2015-11-13 NOTE — Telephone Encounter (Signed)
Incoming call.  Pt states she feels like she is in A Fib this morning. Does not have HR readings. Does not have BP.   She denies SOB, lightheadedness, dizziness. Denies CP.  I gave instruction to check VS and call me back - may be able to take extra metoprolol to see if symptoms resolve. She has had spontaneous termination of A Fib, also hx of DCCV.  Called A Fib clinic for availability - Stacy offered 9:30am appt which patient states she can make.  Pt reports BP of 135/90, HR 93 (obtained on cuff). Advised OK to take extra 1/2 of metoprolol - symptoms may resolve or improve w/ this. Further recommendations to be determined at clinic visit.  Pt given location, directions, contact info for clinic. Verbalized understanding.

## 2015-11-16 ENCOUNTER — Telehealth (HOSPITAL_COMMUNITY): Payer: Self-pay | Admitting: *Deleted

## 2015-11-16 NOTE — Telephone Encounter (Signed)
Patient called in to report she converted to NSR on Saturday evening. Patient instructed to call if further issues arise. Patient was appreciative of advice.

## 2015-11-17 ENCOUNTER — Other Ambulatory Visit: Payer: Self-pay | Admitting: Cardiology

## 2015-11-18 DIAGNOSIS — H35343 Macular cyst, hole, or pseudohole, bilateral: Secondary | ICD-10-CM | POA: Diagnosis not present

## 2015-11-18 DIAGNOSIS — H43813 Vitreous degeneration, bilateral: Secondary | ICD-10-CM | POA: Diagnosis not present

## 2015-11-18 DIAGNOSIS — H35371 Puckering of macula, right eye: Secondary | ICD-10-CM | POA: Diagnosis not present

## 2015-11-30 DIAGNOSIS — M0589 Other rheumatoid arthritis with rheumatoid factor of multiple sites: Secondary | ICD-10-CM | POA: Diagnosis not present

## 2015-12-09 ENCOUNTER — Ambulatory Visit (INDEPENDENT_AMBULATORY_CARE_PROVIDER_SITE_OTHER): Payer: Medicare Other | Admitting: Behavioral Health

## 2015-12-09 DIAGNOSIS — Z5181 Encounter for therapeutic drug level monitoring: Secondary | ICD-10-CM

## 2015-12-09 DIAGNOSIS — I4891 Unspecified atrial fibrillation: Secondary | ICD-10-CM

## 2015-12-09 LAB — POCT INR: INR: 3.7

## 2015-12-09 NOTE — Progress Notes (Signed)
Pre visit review using our clinic review tool, if applicable. No additional management support is needed unless otherwise documented below in the visit note.  Patient presents in office for INR recheck. Today's reading was 3.7. Patient reported that 3 weeks ago she stopped taking her daily flax-seed oil capsule. She also voiced that she increased her cabbage intake last week and ate it three times for dinner. Advised patient that eating less greens can increase INR levels and make the blood thinner, therefore it's important to continue eating foods like cabbage that are high in Vitamin K, but also be consistent with your intake of greens as well.   Per Dr. Larose Kells: Skip next 2 doses and then continue taking 1 tablet daily, except 1.5 tablets on Tuesdays and Saturdays. Recheck INR in 2 weeks. Informed patient of the provider's instructions. She understood and did not have any concerns prior to leaving the nurse visit.  Next appointment scheduled for 12/23/15 at 10:00 AM.

## 2015-12-09 NOTE — Patient Instructions (Signed)
Per Dr. Larose Kells: Skip next 2 doses and then continue taking 1 tablet daily, except 1.5 tablets on Tuesdays and Saturdays. Recheck INR in 2 weeks.

## 2015-12-11 ENCOUNTER — Telehealth: Payer: Self-pay | Admitting: *Deleted

## 2015-12-11 NOTE — Telephone Encounter (Signed)
Unable to reach patient at time of call. Left message for patient confirming MWV w/ Ashlee.

## 2015-12-14 ENCOUNTER — Telehealth: Payer: Self-pay | Admitting: Family Medicine

## 2015-12-14 ENCOUNTER — Ambulatory Visit (INDEPENDENT_AMBULATORY_CARE_PROVIDER_SITE_OTHER): Payer: Medicare Other

## 2015-12-14 VITALS — BP 126/60 | HR 51 | Ht 62.5 in | Wt 244.8 lb

## 2015-12-14 DIAGNOSIS — Z0000 Encounter for general adult medical examination without abnormal findings: Secondary | ICD-10-CM

## 2015-12-14 NOTE — Progress Notes (Signed)
Subjective:   [name] is a 70 y.o. female who presents for Medicare Annual (Subsequent) preventive examination.  Review of Systems:  No ROS Cardiac Risk Factors include: advanced age (>33men, >13 women);obesity (BMI >30kg/m2);sedentary lifestyle;hypertension (A. fib, Hx. MI) Sleep patterns:  Sleeps at least 7 hours per night; wears CPAP/wakes up at least once to void during the night. Home Safety/Smoke Alarms:  Feels safe at home.  Lives with husband in one level home.  Smoke alarms and alarm system present.   Firearm Safety:  Kept in safe place.   Seat Belt Safety/Bike Helmet:  Always wears seat belt.    Counseling:   Eye Exam- 10/2015 Dental- Looking for another dentist.   Female:  Mammo- 08/24/15-negative     Dexa scan-12/12/11-osteoporosis; fall prevention discussed; encouraged to take calcium + Vitamin D and increase physical activity   CCS-09/10/12; repeat in 2 years.  Pt last saw Carlean Purl within the last 6 months, CCS not recommended at this time per patient.      Objective:     Vitals: BP 126/60 mmHg  Pulse 51  Ht 5' 2.5" (1.588 m)  Wt 244 lb 12.8 oz (111.041 kg)  BMI 44.03 kg/m2  SpO2 97%  Tobacco History  Smoking status  . Never Smoker   Smokeless tobacco  . Never Used     Counseling given: No   Past Medical History  Diagnosis Date  . CAD (coronary artery disease)   . Depression   . GERD (gastroesophageal reflux disease)   . Hyperlipidemia   . Hypertension   . Crohn's colitis ([place])     she reports ulcerative colitis beginning in her 65's, biopsies 2008 suggest Crohn's not UC  . 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 Pancreatitis   . Chronic sinusitis   . Renal oncocytoma     s/p right nephrectomy  . Myocardial infarction ([place])   . Uveitis   . Anxiety   . Obesity   . Chronic cough   . OSA (obstructive sleep apnea)   . Paroxysmal atrial fibrillation ([facility])   . Arthritis   . IBS (irritable bowel syndrome)   . Blood transfusion  without reported diagnosis 1976  . Sleep apnea   . Keloid of skin   . Medicare annual wellness visit, subsequent 08/17/2012   Past Surgical History  Procedure Laterality Date  . Nephrectomy  10/2006    right secondary to oncocytoma   . Breast biopsy  1996    benign lesion   . Cholecystectomy  10/2006  . Tubal ligation    . Right renal artery repair  1976  . Right femoral  pseudoaneurysm & right groin hematoma evacuation  02/2007  . Colonoscopy w/ biopsies    . Esophagogastroduodenoscopy    . Coronary angioplasty    . Cataract surgery Left 11/13/14   Family History  Problem Relation Age of Onset  . Colitis Father   . Hypertension Mother   . Coronary artery disease Mother   . Stroke Mother   . Stroke Brother   . Colon cancer Paternal Aunt   . Clotting disorder Mother   . Clotting disorder Brother   . Clotting disorder Sister   . Heart disease Mother   . Heart disease Father   . Asthma Daughter   . Allergies Mother   . Asthma Maternal Uncle   . Colon cancer Paternal Aunt   . Colon polyps Neg Hx   . Kidney disease Neg Hx  History  Sexual Activity  . Sexual Activity: Not Currently    Outpatient Encounter Prescriptions as of 12/14/2015  Medication Sig  . amLODipine (NORVASC) 10 MG tablet TAKE 1 TABLET (10 MG TOTAL) BY MOUTH DAILY.  Marland Kitchen dofetilide (TIKOSYN) 125 MCG capsule TAKE 3 CAPSULES (375 MCG TOTAL) BY MOUTH 2 TIMES DAILY. *Please schedule appt for more refills.*  . escitalopram (LEXAPRO) 10 MG tablet Take 10 mg by mouth daily.  . hydrocortisone (ANUSOL-HC) 25 MG suppository Place 1 suppository (25 mg total) rectally at bedtime as needed for hemorrhoids or itching.  . InFLIXimab (REMICADE IV) Inject into the vein as directed.  . loperamide (IMODIUM A-D) 2 MG tablet Take 1 mg by mouth daily as needed. Dihrrhea  . losartan (COZAAR) 100 MG tablet TAKE 1 TABLET (100 MG TOTAL) BY MOUTH DAILY.  . metoprolol tartrate (LOPRESSOR) 25 MG tablet Take 1 tablet (25 mg total) by  mouth 2 (two) times daily.  . Multiple Vitamin (MULTIVITAMIN) capsule Take 1 capsule by mouth daily. 1 daily    . nitroGLYCERIN (NITROSTAT) 0.4 MG SL tablet Place 1 tablet (0.4 mg total) under the tongue every 5 (five) minutes as needed. 1 tab under tongue every 5 min for chest pain not to exceed 3 tabs in 15 min  . Probiotic Product ([place]) CAPS Take 1 capsule by mouth daily. 1 per day  . warfarin (COUMADIN) 2.5 MG tablet Take 1-1.5 tablets by mouth daily as directed by coumadin clinic  . ammonium lactate (LAC-HYDRIN) 12 % lotion Apply 1 application topically daily as needed. Reported on 12/14/2015   No facility-administered encounter medications on file as of 12/14/2015.    Activities of Daily Living In your present state of health, do you have any difficulty performing the following activities: 12/14/2015  Hearing? Y  Vision? N  Difficulty concentrating or making decisions? N  Walking or climbing stairs? Y  Dressing or bathing? N  Doing errands, shopping? N  Preparing Food and eating ? N  Using the Toilet? N  In the past six months, have you accidently leaked urine? Y  Do you have problems with loss of bowel control? Y  Managing your Medications? N  Managing your Finances? N  Housekeeping or managing your Housekeeping? N    Patient Care Team: [name], MD as PCP - General (Family Medicine) Lelon Perla, MD (Cardiology) Gatha Mayer, MD as Consulting Physician (Gastroenterology) Nani Ravens, MD as Referring Physician (Dermatology) Rigoberto Noel, MD as Consulting Physician (Pulmonary Disease) Calvert Cantor, MD as Consulting Physician (Ophthalmology) Sherlynn Stalls, MD as Consulting Physician (Ophthalmology) Hennie Duos, MD as Consulting Physician (Rheumatology)    Assessment:  Deferred to PCP.    Exercise Activities and Dietary recommendations Current Exercise Habits:: The patient does not participate in regular exercise at present    Diet:  Gluten free.  Eats 3 meals per day.    Goals    . Increase physical activity     Walking       Fall Risk Fall Risk  12/14/2015 08/31/2015 08/28/2014  Falls in the past year? No No No   Depression Screen PHQ 2/9 Scores 12/14/2015 08/31/2015 08/28/2014  PHQ - 2 Score 0 4 1  PHQ- 9 Score - 8 -     Cognitive Testing MMSE - Mini Mental State Exam 12/14/2015  Orientation to time 5  Orientation to Place 5  Registration 3  Attention/ Calculation 5  Recall 3  Language- name 2 objects 2  Language- repeat 1  Language- follow 3 step command 3  Language- read & follow direction 1  Write a sentence 1  Copy design 1  Total score 30    Immunization History  Administered Date(s) Administered  . Influenza Split 07/23/2012  . Influenza Whole 09/06/2007, 08/11/2008, 08/24/2009, 08/03/2010  . Influenza, High Dose Seasonal PF 08/02/2013  . Influenza,inj,Quad PF,36+ Mos 06/27/2014, 06/26/2015  . Pneumococcal Conjugate-13 08/28/2014  . Pneumococcal Polysaccharide-23 09/13/2010  . Td 05/12/2009  . Zoster 02/11/2008   Screening Tests Health Maintenance  Topic Date Due  . Hepatitis C Screening  1946-08-16  . COLONOSCOPY  09/23/2016 (Originally 09/10/2014)  . INFLUENZA VACCINE  05/24/2016  . MAMMOGRAM  08/23/2017  . TETANUS/TDAP  05/13/2019  . DEXA SCAN  Completed  . ZOSTAVAX  Completed  . PNA vac Low Risk Adult  Completed      Plan:  Continue doing brain stimulating activities (puzzles, reading, adult coloring books, staying active) to keep memory sharp.   Eat heart healthy diet (full of fruits, vegetables, whole grains, lean protein, water--limit salt, fat, and sugar intake) and increase physical activity as tolerated.  Have [facility] send [place] a copy of your labs (Hepatitis C screening) to fax number [phone].      Follow up with Dr. Charlett Blake as scheduled.     During the course of the visit the patient was educated and counseled about the following  appropriate screening and preventive services:   Vaccines to include Pneumoccal, Influenza, Hepatitis B, Td, Zostavax, HCV  Electrocardiogram  Cardiovascular Disease  Colorectal cancer screening  Bone density screening  Diabetes screening  Glaucoma screening  Mammography/PAP  Nutrition counseling   Patient Instructions (the written plan) was given to the patient.   Rudene Anda, RN  12/14/2015

## 2015-12-14 NOTE — Progress Notes (Signed)
Pre visit review using our clinic review tool, if applicable. No additional management support is needed unless otherwise documented below in the visit note. 

## 2015-12-14 NOTE — Progress Notes (Signed)
RN AWV note reviewed. Agree with documention and plan. 

## 2015-12-14 NOTE — Patient Instructions (Addendum)
Continue doing brain stimulating activities (puzzles, reading, adult coloring books, staying active) to keep memory sharp.   Eat heart healthy diet (full of fruits, vegetables, whole grains, lean protein, water--limit salt, fat, and sugar intake) and increase physical activity as tolerated.  Have [facility] send [place] a copy of your labs (Hepatitis C screening) to fax number [phone].      Follow up with Dr. Charlett Blake as scheduled.    Fat and Cholesterol Restricted Diet Getting too much fat and cholesterol in your diet may cause health problems. Following this diet helps keep your fat and cholesterol at normal levels. This can keep you from getting sick. WHAT TYPES OF FAT SHOULD I CHOOSE?  Choose monosaturated and polyunsaturated fats. These are found in foods such as olive oil, canola oil, flaxseeds, walnuts, almonds, and seeds.  Eat more omega-3 fats. Good choices include salmon, mackerel, sardines, tuna, flaxseed oil, and ground flaxseeds.  Limit saturated fats. These are in animal products such as meats, butter, and cream. They can also be in plant products such as palm oil, palm kernel oil, and coconut oil.   Avoid foods with partially hydrogenated oils in them. These contain trans fats. Examples of foods that have trans fats are stick margarine, some tub margarines, cookies, crackers, and other baked goods. WHAT GENERAL GUIDELINES DO I NEED TO FOLLOW?   Check food labels. Look for the words "trans fat" and "saturated fat."  When preparing a meal:  Fill half of your plate with vegetables and green salads.  Fill one fourth of your plate with whole grains. Look for the word "whole" as the first word in the ingredient list.  Fill one fourth of your plate with lean protein foods.  Limit fruit to two servings a day. Choose fruit instead of juice.  Eat more foods with soluble fiber. Examples of foods with this type of fiber are apples, broccoli, carrots, beans, peas, and  barley. Try to get 20-30 g (grams) of fiber per day.  Eat more home-cooked foods. Eat less at restaurants and buffets.  Limit or avoid alcohol.  Limit foods high in starch and sugar.  Limit fried foods.  Cook foods without frying them. Baking, boiling, grilling, and broiling are all great options.  Lose weight if you are overweight. Losing even a small amount of weight can help your overall health. It can also help prevent diseases such as diabetes and heart disease. WHAT FOODS CAN I EAT? Grains Whole grains, such as whole wheat or whole grain breads, crackers, cereals, and pasta. Unsweetened oatmeal, bulgur, barley, quinoa, or brown rice. Corn or whole wheat flour tortillas. Vegetables Fresh or frozen vegetables (raw, steamed, roasted, or grilled). Green salads. Fruits All fresh, canned (in natural juice), or frozen fruits. Meat and Other Protein Products Ground beef (85% or leaner), grass-fed beef, or beef trimmed of fat. Skinless chicken or [place]. Ground chicken or [place]. Pork trimmed of fat. All fish and seafood. Eggs. Dried beans, peas, or lentils. Unsalted nuts or seeds. Unsalted canned or dry beans. Dairy Low-fat dairy products, such as skim or 1% milk, 2% or reduced-fat cheeses, low-fat ricotta or cottage cheese, or plain low-fat yogurt. Fats and Oils Tub margarines without trans fats. Light or reduced-fat mayonnaise and salad dressings. Avocado. Olive, canola, sesame, or safflower oils. Natural peanut or almond butter (choose ones without added sugar and oil). The items listed above may not be a complete list of recommended foods or beverages. Contact your dietitian for more options. WHAT FOODS  ARE NOT RECOMMENDED? Grains White bread. White pasta. White rice. Cornbread. Bagels, pastries, and croissants. Crackers that contain trans fat. Vegetables White potatoes. Corn. Creamed or fried vegetables. Vegetables in a cheese sauce. Fruits Dried fruits. Canned fruit in light or  heavy syrup. Fruit juice. Meat and Other Protein Products Fatty cuts of meat. Ribs, chicken wings, bacon, sausage, bologna, salami, chitterlings, fatback, hot dogs, bratwurst, and packaged luncheon meats. Liver and organ meats. Dairy Whole or 2% milk, cream, half-and-half, and cream cheese. Whole milk cheeses. Whole-fat or sweetened yogurt. Full-fat cheeses. Nondairy creamers and whipped toppings. Processed cheese, cheese spreads, or cheese curds. Sweets and Desserts Corn syrup, sugars, honey, and molasses. Candy. Jam and jelly. Syrup. Sweetened cereals. Cookies, pies, cakes, donuts, muffins, and ice cream. Fats and Oils Butter, stick margarine, lard, shortening, ghee, or bacon fat. Coconut, palm kernel, or palm oils. Beverages Alcohol. Sweetened drinks (such as sodas, lemonade, and fruit drinks or punches). The items listed above may not be a complete list of foods and beverages to avoid. Contact your dietitian for more information.   This information is not intended to replace advice given to you by your health care provider. Make sure you discuss any questions you have with your health care provider.   Document Released: 04/10/2012 Document Revised: 10/31/2014 Document Reviewed: 01/09/2014 Elsevier Interactive Patient Education 2016 [place] in the Home  Falls can cause injuries. They can happen to people of all ages. There are many things you can do to make your home safe and to help prevent falls.  WHAT CAN I DO ON THE OUTSIDE OF MY HOME?  Regularly fix the edges of walkways and driveways and fix any cracks.  Remove anything that might make you trip as you walk through a door, such as a raised step or threshold.  Trim any bushes or trees on the path to your home.  Use bright outdoor lighting.  Clear any walking paths of anything that might make someone trip, such as rocks or tools.  Regularly check to see if handrails are loose or broken. Make sure that  both sides of any steps have handrails.  Any raised decks and porches should have guardrails on the edges.  Have any leaves, snow, or ice cleared regularly.  Use sand or salt on walking paths during winter.  Clean up any spills in your garage right away. This includes oil or grease spills. WHAT CAN I DO IN THE BATHROOM?   Use night lights.  Install grab bars by the toilet and in the tub and shower. Do not use towel bars as grab bars.  Use non-skid mats or decals in the tub or shower.  If you need to sit down in the shower, use a plastic, non-slip stool.  Keep the floor dry. Clean up any water that spills on the floor as soon as it happens.  Remove soap buildup in the tub or shower regularly.  Attach bath mats securely with double-sided non-slip rug tape.  Do not have throw rugs and other things on the floor that can make you trip. WHAT CAN I DO IN THE BEDROOM?  Use night lights.  Make sure that you have a light by your bed that is easy to reach.  Do not use any sheets or blankets that are too big for your bed. They should not hang down onto the floor.  Have a firm chair that has side arms. You can use this for support while you get dressed.  Do not have throw rugs and other things on the floor that can make you trip. WHAT CAN I DO IN THE KITCHEN?  Clean up any spills right away.  Avoid walking on wet floors.  Keep items that you use a lot in easy-to-reach places.  If you need to reach something above you, use a strong step stool that has a grab bar.  Keep electrical cords out of the way.  Do not use floor polish or wax that makes floors slippery. If you must use wax, use non-skid floor wax.  Do not have throw rugs and other things on the floor that can make you trip. WHAT CAN I DO WITH MY STAIRS?  Do not leave any items on the stairs.  Make sure that there are handrails on both sides of the stairs and use them. Fix handrails that are broken or loose. Make sure  that handrails are as long as the stairways.  Check any carpeting to make sure that it is firmly attached to the stairs. Fix any carpet that is loose or worn.  Avoid having throw rugs at the top or bottom of the stairs. If you do have throw rugs, attach them to the floor with carpet tape.  Make sure that you have a light switch at the top of the stairs and the bottom of the stairs. If you do not have them, ask someone to add them for you. WHAT ELSE CAN I DO TO HELP PREVENT FALLS?  Wear shoes that:  Do not have high heels.  Have rubber bottoms.  Are comfortable and fit you well.  Are closed at the toe. Do not wear sandals.  If you use a stepladder:  Make sure that it is fully opened. Do not climb a closed stepladder.  Make sure that both sides of the stepladder are locked into place.  Ask someone to hold it for you, if possible.  Clearly mark and make sure that you can see:  Any grab bars or handrails.  First and last steps.  Where the edge of each step is.  Use tools that help you move around (mobility aids) if they are needed. These include:  Canes.  Walkers.  Scooters.  Crutches.  Turn on the lights when you go into a dark area. Replace any light bulbs as soon as they burn out.  Set up your furniture so you have a clear path. Avoid moving your furniture around.  If any of your floors are uneven, fix them.  If there are any pets around you, be aware of where they are.  Review your medicines with your doctor. Some medicines can make you feel dizzy. This can increase your chance of falling. Ask your doctor what other things that you can do to help prevent falls.   This information is not intended to replace advice given to you by your health care provider. Make sure you discuss any questions you have with your health care provider.   Document Released: 08/06/2009 Document Revised: 02/24/2015 Document Reviewed: 11/14/2014 Elsevier Interactive Patient Education  2016 [place] Maintenance, Female Adopting a healthy lifestyle and getting preventive care can go a long way to promote health and wellness. Talk with your health care provider about what schedule of regular examinations is right for you. This is a good chance for you to check in with your provider about disease prevention and staying healthy. In between checkups, there are plenty of things you can do on your own.  Experts have done a lot of research about which lifestyle changes and preventive measures are most likely to keep you healthy. Ask your health care provider for more information. WEIGHT AND DIET  Eat a healthy diet  Be sure to include plenty of vegetables, fruits, low-fat dairy products, and lean protein.  Do not eat a lot of foods high in solid fats, added sugars, or salt.  Get regular exercise. This is one of the most important things you can do for your health.  Most adults should exercise for at least 150 minutes each week. The exercise should increase your heart rate and make you sweat (moderate-intensity exercise).  Most adults should also do strengthening exercises at least twice a week. This is in addition to the moderate-intensity exercise.  Maintain a healthy weight  Body mass index (BMI) is a measurement that can be used to identify possible weight problems. It estimates body fat based on height and weight. Your health care provider can help determine your BMI and help you achieve or maintain a healthy weight.  For females 21 years of age and older:   A BMI below 18.5 is considered underweight.  A BMI of 18.5 to 24.9 is normal.  A BMI of 25 to 29.9 is considered overweight.  A BMI of 30 and above is considered obese.  Watch levels of cholesterol and blood lipids  You should start having your blood tested for lipids and cholesterol at 71 years of age, then have this test every 5 years.  You may need to have your cholesterol levels checked more  often if:  Your lipid or cholesterol levels are high.  You are older than 71 years of age.  You are at high risk for heart disease.  CANCER SCREENING   Lung Cancer  Lung cancer screening is recommended for adults 32-89 years old who are at high risk for lung cancer because of a history of smoking.  A yearly low-dose CT scan of the lungs is recommended for people who:  Currently smoke.  Have quit within the past 15 years.  Have at least a 30-pack-year history of smoking. A pack year is smoking an average of one pack of cigarettes a day for 1 year.  Yearly screening should continue until it has been 15 years since you quit.  Yearly screening should stop if you develop a health problem that would prevent you from having lung cancer treatment.  Breast Cancer  Practice breast self-awareness. This means understanding how your breasts normally appear and feel.  It also means doing regular breast self-exams. Let your health care provider know about any changes, no matter how small.  If you are in your 20s or 30s, you should have a clinical breast exam (CBE) by a health care provider every 1-3 years as part of a regular health exam.  If you are 6 or older, have a CBE every year. Also consider having a breast X-ray (mammogram) every year.  If you have a family history of breast cancer, talk to your health care provider about genetic screening.  If you are at high risk for breast cancer, talk to your health care provider about having an MRI and a mammogram every year.  Breast cancer gene (BRCA) assessment is recommended for women who have family members with BRCA-related cancers. BRCA-related cancers include:  Breast.  Ovarian.  Tubal.  Peritoneal cancers.  Results of the assessment will determine the need for genetic counseling and BRCA1 and BRCA2 testing. Cervical Cancer  Your health care provider may recommend that you be screened regularly for cancer of the pelvic organs  (ovaries, uterus, and vagina). This screening involves a pelvic examination, including checking for microscopic changes to the surface of your cervix (Pap test). You may be encouraged to have this screening done every 3 years, beginning at age 32.  For women ages 39-65, health care providers may recommend pelvic exams and Pap testing every 3 years, or they may recommend the Pap and pelvic exam, combined with testing for human papilloma virus (HPV), every 5 years. Some types of HPV increase your risk of cervical cancer. Testing for HPV may also be done on women of any age with unclear Pap test results.  Other health care providers may not recommend any screening for nonpregnant women who are considered low risk for pelvic cancer and who do not have symptoms. Ask your health care provider if a screening pelvic exam is right for you.  If you have had past treatment for cervical cancer or a condition that could lead to cancer, you need Pap tests and screening for cancer for at least 20 years after your treatment. If Pap tests have been discontinued, your risk factors (such as having a new sexual partner) need to be reassessed to determine if screening should resume. Some women have medical problems that increase the chance of getting cervical cancer. In these cases, your health care provider may recommend more frequent screening and Pap tests. Colorectal Cancer  This type of cancer can be detected and often prevented.  Routine colorectal cancer screening usually begins at 71 years of age and continues through 71 years of age.  Your health care provider may recommend screening at an earlier age if you have risk factors for colon cancer.  Your health care provider may also recommend using home test kits to check for hidden blood in the stool.  A small camera at the end of a tube can be used to examine your colon directly (sigmoidoscopy or colonoscopy). This is done to check for the earliest forms of  colorectal cancer.  Routine screening usually begins at age 20.  Direct examination of the colon should be repeated every 5-10 years through 71 years of age. However, you may need to be screened more often if early forms of precancerous polyps or small growths are found. Skin Cancer  Check your skin from head to toe regularly.  Tell your health care provider about any new moles or changes in moles, especially if there is a change in a mole's shape or color.  Also tell your health care provider if you have a mole that is larger than the size of a pencil eraser.  Always use sunscreen. Apply sunscreen liberally and repeatedly throughout the day.  Protect yourself by wearing long sleeves, pants, a wide-brimmed hat, and sunglasses whenever you are outside. HEART DISEASE, DIABETES, AND HIGH BLOOD PRESSURE   High blood pressure causes heart disease and increases the risk of stroke. High blood pressure is more likely to develop in:  People who have blood pressure in the high end of the normal range (130-139/85-89 mm Hg).  People who are overweight or obese.  People who are African American.  If you are 74-15 years of age, have your blood pressure checked every 3-5 years. If you are 4 years of age or older, have your blood pressure checked every year. You should have your blood pressure measured twice--once when you are at a hospital or clinic, and once  when you are not at a hospital or clinic. Record the average of the two measurements. To check your blood pressure when you are not at a hospital or clinic, you can use:  An automated blood pressure machine at a pharmacy.  A home blood pressure monitor.  If you are between 36 years and 58 years old, ask your health care provider if you should take aspirin to prevent strokes.  Have regular diabetes screenings. This involves taking a blood sample to check your fasting blood sugar level.  If you are at a normal weight and have a low risk for  diabetes, have this test once every three years after 71 years of age.  If you are overweight and have a high risk for diabetes, consider being tested at a younger age or more often. PREVENTING INFECTION  Hepatitis B  If you have a higher risk for hepatitis B, you should be screened for this virus. You are considered at high risk for hepatitis B if:  You were born in a country where hepatitis B is common. Ask your health care provider which countries are considered high risk.  Your parents were born in a high-risk country, and you have not been immunized against hepatitis B (hepatitis B vaccine).  You have HIV or AIDS.  You use needles to inject street drugs.  You live with someone who has hepatitis B.  You have had sex with someone who has hepatitis B.  You get hemodialysis treatment.  You take certain medicines for conditions, including cancer, organ transplantation, and autoimmune conditions. Hepatitis C  Blood testing is recommended for:  Everyone born from [birth year] through 1966.  Anyone with known risk factors for hepatitis C. Sexually transmitted infections (STIs)  You should be screened for sexually transmitted infections (STIs) including gonorrhea and chlamydia if:  You are sexually active and are younger than 71 years of age.  You are older than 71 years of age and your health care provider tells you that you are at risk for this type of infection.  Your sexual activity has changed since you were last screened and you are at an increased risk for chlamydia or gonorrhea. Ask your health care provider if you are at risk.  If you do not have HIV, but are at risk, it may be recommended that you take a prescription medicine daily to prevent HIV infection. This is called pre-exposure prophylaxis (PrEP). You are considered at risk if:  You are sexually active and do not regularly use condoms or know the HIV status of your partner(s).  You take drugs by injection.  You are  sexually active with a partner who has HIV. Talk with your health care provider about whether you are at high risk of being infected with HIV. If you choose to begin PrEP, you should first be tested for HIV. You should then be tested every 3 months for as long as you are taking PrEP.  PREGNANCY   If you are premenopausal and you may become pregnant, ask your health care provider about preconception counseling.  If you may become pregnant, take 400 to 800 micrograms (mcg) of folic acid every day.  If you want to prevent pregnancy, talk to your health care provider about birth control (contraception). OSTEOPOROSIS AND MENOPAUSE   Osteoporosis is a disease in which the bones lose minerals and strength with aging. This can result in serious bone fractures. Your risk for osteoporosis can be identified using a bone density scan.  If  you are 53 years of age or older, or if you are at risk for osteoporosis and fractures, ask your health care provider if you should be screened.  Ask your health care provider whether you should take a calcium or vitamin D supplement to lower your risk for osteoporosis.  Menopause may have certain physical symptoms and risks.  Hormone replacement therapy may reduce some of these symptoms and risks. Talk to your health care provider about whether hormone replacement therapy is right for you.  HOME CARE INSTRUCTIONS   Schedule regular health, dental, and eye exams.  Stay current with your immunizations.   Do not use any tobacco products including cigarettes, chewing tobacco, or electronic cigarettes.  If you are pregnant, do not drink alcohol.  If you are breastfeeding, limit how much and how often you drink alcohol.  Limit alcohol intake to no more than 1 drink per day for nonpregnant women. One drink equals 12 ounces of beer, 5 ounces of wine, or 1 ounces of hard liquor.  Do not use street drugs.  Do not share needles.  Ask your health care provider for  help if you need support or information about quitting drugs.  Tell your health care provider if you often feel depressed.  Tell your health care provider if you have ever been abused or do not feel safe at home.   This information is not intended to replace advice given to you by your health care provider. Make sure you discuss any questions you have with your health care provider.   Document Released: 04/25/2011 Document Revised: 10/31/2014 Document Reviewed: 09/11/2013 Elsevier Interactive Patient Education [employer].

## 2015-12-16 ENCOUNTER — Other Ambulatory Visit: Payer: Self-pay | Admitting: Cardiology

## 2015-12-16 NOTE — Telephone Encounter (Signed)
Rx(s) sent to pharmacy electronically.  

## 2015-12-22 DIAGNOSIS — L570 Actinic keratosis: Secondary | ICD-10-CM | POA: Diagnosis not present

## 2015-12-22 DIAGNOSIS — Z08 Encounter for follow-up examination after completed treatment for malignant neoplasm: Secondary | ICD-10-CM | POA: Diagnosis not present

## 2015-12-22 DIAGNOSIS — D485 Neoplasm of uncertain behavior of skin: Secondary | ICD-10-CM | POA: Diagnosis not present

## 2015-12-22 DIAGNOSIS — Z85828 Personal history of other malignant neoplasm of skin: Secondary | ICD-10-CM | POA: Diagnosis not present

## 2015-12-22 DIAGNOSIS — L820 Inflamed seborrheic keratosis: Secondary | ICD-10-CM | POA: Diagnosis not present

## 2015-12-23 ENCOUNTER — Ambulatory Visit (INDEPENDENT_AMBULATORY_CARE_PROVIDER_SITE_OTHER): Payer: Medicare Other | Admitting: *Deleted

## 2015-12-23 DIAGNOSIS — I4891 Unspecified atrial fibrillation: Secondary | ICD-10-CM | POA: Diagnosis not present

## 2015-12-23 DIAGNOSIS — Z5181 Encounter for therapeutic drug level monitoring: Secondary | ICD-10-CM

## 2015-12-23 LAB — POCT INR: INR: 2.6

## 2015-12-23 NOTE — Patient Instructions (Signed)
Per Dr. Larose Kells: Continue taking 1 tablet daily, except 1.5 tablets on Tuesdays and Saturdays. Recheck INR in 4 weeks.

## 2015-12-23 NOTE — Progress Notes (Signed)
Pre visit review using our clinic review tool, if applicable. No additional management support is needed unless otherwise documented below in the visit note.  INR 2.6. Pt findings negative.   Per Dr. Larose Kells: Continue taking 1 tablet daily, except 1.5 tablets on Tuesdays and Saturdays. Recheck INR in 4 weeks.  Pt aware of instructions and verbalized understanding.   Next appt: 01/26/16  Dorrene German, RN

## 2015-12-29 ENCOUNTER — Encounter: Payer: Self-pay | Admitting: Family Medicine

## 2015-12-29 ENCOUNTER — Ambulatory Visit (INDEPENDENT_AMBULATORY_CARE_PROVIDER_SITE_OTHER): Payer: Medicare Other | Admitting: Family Medicine

## 2015-12-29 VITALS — BP 122/82 | HR 71 | Temp 98.4°F | Ht 63.0 in | Wt 242.0 lb

## 2015-12-29 DIAGNOSIS — E669 Obesity, unspecified: Secondary | ICD-10-CM | POA: Diagnosis not present

## 2015-12-29 DIAGNOSIS — N259 Disorder resulting from impaired renal tubular function, unspecified: Secondary | ICD-10-CM | POA: Diagnosis not present

## 2015-12-29 DIAGNOSIS — I10 Essential (primary) hypertension: Secondary | ICD-10-CM | POA: Diagnosis not present

## 2015-12-29 DIAGNOSIS — I4891 Unspecified atrial fibrillation: Secondary | ICD-10-CM

## 2015-12-29 DIAGNOSIS — K219 Gastro-esophageal reflux disease without esophagitis: Secondary | ICD-10-CM

## 2015-12-29 MED ORDER — ESCITALOPRAM OXALATE 10 MG PO TABS: 10.0000 mg | ORAL_TABLET | Freq: Every day | ORAL | Status: DC

## 2015-12-29 NOTE — Progress Notes (Signed)
Pre visit review using our clinic review tool, if applicable. No additional management support is needed unless otherwise documented below in the visit note. 

## 2015-12-29 NOTE — Progress Notes (Signed)
Patient ID: [name], female   DOB: [birth year], 70 y.o.   MRN: [id number]   Subjective:    Patient ID: [name], female    DOB: April 17, 1946, 70 y.o.   MRN: [id number]  Chief Complaint  Patient presents with  . Follow-up    HPI Patient is in today for follow up. No recent illness. Notes some right hip pain denies any trauma or falls. Is denying any significant low back pain. No polyuria or she does note some stress urinary incontinence. Denies CP/palp/SOB/HA/congestion/fevers/GI or GU c/o. Taking meds as prescribed  Past Medical History  Diagnosis Date  . CAD (coronary artery disease)   . Depression   . GERD (gastroesophageal reflux disease)   . Hyperlipidemia   . Hypertension   . Crohn's colitis ([place])     she reports ulcerative colitis beginning in her 32's, biopsies 2008 suggest Crohn's not UC  . Pseudoaneurysm of right femoral artery ([place])   . Pancreatitis   . Chronic sinusitis   . Renal oncocytoma     s/p right nephrectomy  . Myocardial infarction ([place])   . Uveitis   . Anxiety   . Obesity   . Chronic cough   . OSA (obstructive sleep apnea)   . Paroxysmal atrial fibrillation ([facility])   . Arthritis   . IBS (irritable bowel syndrome)   . Blood transfusion without reported diagnosis 1976  . Sleep apnea   . Keloid of skin   . Medicare annual wellness visit, subsequent 08/17/2012    Past Surgical History  Procedure Laterality Date  . Nephrectomy  10/2006    right secondary to oncocytoma   . Breast biopsy  1996    benign lesion   . Cholecystectomy  10/2006  . Tubal ligation    . Right renal artery repair  1976  . Right femoral  pseudoaneurysm & right groin hematoma evacuation  02/2007  . Colonoscopy w/ biopsies    . Esophagogastroduodenoscopy    . Coronary angioplasty    . Cataract surgery Left 11/13/14    Family History  Problem Relation Age of Onset  . Colitis Father   . Hypertension Mother   . Coronary artery disease Mother   . Stroke Mother   .  Stroke Brother   . Colon cancer Paternal Aunt   . Clotting disorder Mother   . Clotting disorder Brother   . Clotting disorder Sister   . Heart disease Mother   . Heart disease Father   . Asthma Daughter   . Allergies Mother   . Asthma Maternal Uncle   . Colon cancer Paternal Aunt   . Colon polyps Neg Hx   . Kidney disease Neg Hx     Social History   Social History  . Marital Status: Married    Spouse Name: N/A  . Number of Children: 3  . Years of Education: N/A   Occupational History  . Retired     retired   Social History Main Topics  . Smoking status: Never Smoker   . Smokeless tobacco: Never Used  . Alcohol Use: No  . Drug Use: No  . Sexual Activity: Not Currently   Other Topics Concern  . 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  amLODipine (NORVASC) 10 MG tablet TAKE 1 TABLET (10 MG TOTAL) BY MOUTH DAILY. 30 tablet 3  . ammonium lactate (LAC-HYDRIN) 12 % lotion Apply 1 application topically daily as needed. Reported on 12/14/2015    . dofetilide (TIKOSYN) 125 MCG capsule Take 3 capsules (375 mcg total) by mouth 2 (two) times daily. <PLEASE MAKE APPOINTMENT FOR REFILLS> 180 capsule 0  . escitalopram (LEXAPRO) 10 MG tablet Take 10 mg by mouth daily.    . hydrocortisone (ANUSOL-HC) 25 MG suppository Place 1 suppository (25 mg total) rectally at bedtime as needed for hemorrhoids or itching. 12 suppository 2  . InFLIXimab (REMICADE IV) Inject into the vein as directed.    . loperamide (IMODIUM A-D) 2 MG tablet Take 1 mg by mouth daily as needed. Dihrrhea    . losartan (COZAAR) 100 MG tablet TAKE 1 TABLET (100 MG TOTAL) BY MOUTH DAILY. 30 tablet 9  . metoprolol tartrate (LOPRESSOR) 25 MG tablet Take 1 tablet (25 mg total) by mouth 2 (two) times daily. 180  tablet 3  . Multiple Vitamin (MULTIVITAMIN) capsule Take 1 capsule by mouth daily. 1 daily      . nitroGLYCERIN (NITROSTAT) 0.4 MG SL tablet Place 1 tablet (0.4 mg total) under the tongue every 5 (five) minutes as needed. 1 tab under tongue every 5 min for chest pain not to exceed 3 tabs in 15 min 25 tablet 3  . Probiotic Product ([place]) CAPS Take 1 capsule by mouth daily. 1 per day    . warfarin (COUMADIN) 2.5 MG tablet Take 1-1.5 tablets by mouth daily as directed by coumadin clinic 40 tablet 5   No facility-administered medications prior to visit.    Allergies  Allergen Reactions  . Cefdinir     Pt cannot take; may interfere with AFib.  . Codeine     Heart beats fast   . Guaifenesin Er Nausea And Vomiting and Other (See Comments)    Headaches; can tolerate liquid.  . Latex     Breathing problems  . Mucilgen [Psyllium]   . Percocet [Oxycodone-Acetaminophen]     Itching & swelling in jaw    Review of Systems  Constitutional: Negative for fever and malaise/fatigue.  HENT: Negative for congestion.   Eyes: Negative for blurred vision.  Respiratory: Negative for shortness of breath.   Cardiovascular: Negative for chest pain, palpitations and leg swelling.  Gastrointestinal: Negative for nausea, abdominal pain and blood in stool.  Genitourinary: Negative for dysuria and frequency.  Musculoskeletal: Positive for joint pain. Negative for back pain, falls and neck pain.  Skin: Negative for rash.  Neurological: Negative for dizziness, loss of consciousness and headaches.  Endo/Heme/Allergies: Negative for environmental allergies.  Psychiatric/Behavioral: Negative for depression. The patient is not nervous/anxious.        Objective:    Physical Exam  Constitutional: She is oriented to person, place, and time. She appears well-developed and well-nourished. No distress.  HENT:  Head: Normocephalic and atraumatic.  Nose: Nose normal.  Eyes: Right eye exhibits no  discharge. Left eye exhibits no discharge.  Neck: Normal range of motion. Neck supple.  Cardiovascular: Normal rate and regular rhythm.   No murmur heard. Pulmonary/Chest: Effort normal and breath sounds normal.  Abdominal: Soft. Bowel sounds are normal. There is no tenderness.  Musculoskeletal: She exhibits no edema.  Neurological: She is alert and oriented to person, place, and time.  Skin: Skin is warm and dry.  Psychiatric: She has a normal mood and affect.  Nursing  note and vitals reviewed.   BP 122/82 mmHg  Pulse 71  Temp(Src) 98.4 F (36.9 C) (Oral)  Ht 5\' 3"  (1.6 m)  Wt 242 lb (109.77 kg)  BMI 42.88 kg/m2  SpO2 95% Wt Readings from Last 3 Encounters:  12/29/15 242 lb (109.77 kg)  12/14/15 244 lb 12.8 oz (111.041 kg)  11/13/15 239 lb 3.2 oz (108.5 kg)     Lab Results  Component Value Date   WBC 8.7 11/13/2015   HGB 15.3* 11/13/2015   HCT 45.4 11/13/2015   PLT 230 11/13/2015   GLUCOSE 101* 11/13/2015   CHOL 170 08/31/2015   TRIG 104.0 08/31/2015   HDL 47.10 08/31/2015   LDLCALC 103* 08/31/2015   ALT 18 08/31/2015   AST 21 08/31/2015   NA 142 11/13/2015   K 4.1 11/13/2015   CL 105 11/13/2015   CREATININE 1.03* 11/13/2015   BUN 13 11/13/2015   CO2 26 11/13/2015   TSH 3.891 11/13/2015   INR 2.6 12/23/2015   HGBA1C 5.9* 02/28/2011    Lab Results  Component Value Date   TSH 3.891 11/13/2015   Lab Results  Component Value Date   WBC 8.7 11/13/2015   HGB 15.3* 11/13/2015   HCT 45.4 11/13/2015   MCV 94.8 11/13/2015   PLT 230 11/13/2015   Lab Results  Component Value Date   NA 142 11/13/2015   K 4.1 11/13/2015   CO2 26 11/13/2015   GLUCOSE 101* 11/13/2015   BUN 13 11/13/2015   CREATININE 1.03* 11/13/2015   BILITOT 0.6 08/31/2015   ALKPHOS 67 08/31/2015   AST 21 08/31/2015   ALT 18 08/31/2015   PROT 7.3 08/31/2015   ALBUMIN 3.8 08/31/2015   CALCIUM 9.3 11/13/2015   ANIONGAP 11 11/13/2015   GFR 57.59* 08/31/2015   Lab Results  Component  Value Date   CHOL 170 08/31/2015   Lab Results  Component Value Date   HDL 47.10 08/31/2015   Lab Results  Component Value Date   LDLCALC 103* 08/31/2015   Lab Results  Component Value Date   TRIG 104.0 08/31/2015   Lab Results  Component Value Date   CHOLHDL 4 08/31/2015   Lab Results  Component Value Date   HGBA1C 5.9* 02/28/2011       Assessment & Plan:   Problem List Items Addressed This Visit      Genitourinary   Disorder resulting from impaired renal function - Primary    Stable, mild         I am having Ms. Arkwright maintain her loperamide, multivitamin, [facility], ammonium lactate, nitroGLYCERIN, InFLIXimab (REMICADE IV), hydrocortisone, metoprolol tartrate, warfarin, losartan, escitalopram, amLODipine, and dofetilide.  No orders of the defined types were placed in this encounter.     Jan Fireman, [place]

## 2015-12-29 NOTE — Assessment & Plan Note (Signed)
Stable, mild 

## 2015-12-29 NOTE — Patient Instructions (Addendum)
Salonpas with lidocaine patch  Cholesterol Cholesterol is a white, waxy, fat-like substance needed by your body in small amounts. The liver makes all the cholesterol you need. Cholesterol is carried from the liver by the blood through the blood vessels. Deposits of cholesterol (plaque) may build up on blood vessel walls. These make the arteries narrower and stiffer. Cholesterol plaques increase the risk for heart attack and stroke.  You cannot feel your cholesterol level even if it is very high. The only way to know it is high is with a blood test. Once you know your cholesterol levels, you should keep a record of the test results. Work with your health care provider to keep your levels in the desired range.  WHAT DO THE RESULTS MEAN?  Total cholesterol is a rough measure of all the cholesterol in your blood.   LDL is the so-called bad cholesterol. This is the type that deposits cholesterol in the walls of the arteries. You want this level to be low.   HDL is the good cholesterol because it cleans the arteries and carries the LDL away. You want this level to be high.  Triglycerides are fat that the body can either burn for energy or store. High levels are closely linked to heart disease.  WHAT ARE THE DESIRED LEVELS OF CHOLESTEROL?  Total cholesterol below 200.   LDL below 100 for people at risk, below 70 for those at very high risk.   HDL above 50 is good, above 60 is best.   Triglycerides below 150.  HOW CAN I LOWER MY CHOLESTEROL?  Diet. Follow your diet programs as directed by your health care provider.   Choose fish or white meat chicken and [place], roasted or baked. Limit fatty cuts of red meat, fried foods, and processed meats, such as sausage and lunch meats.   Eat lots of fresh fruits and vegetables.  Choose whole grains, beans, pasta, potatoes, and cereals.   Use only small amounts of olive, corn, or canola oils.   Avoid butter, mayonnaise, shortening, or palm  kernel oils.  Avoid foods with trans fats.   Drink skim or nonfat milk and eat low-fat or nonfat yogurt and cheeses. Avoid whole milk, cream, ice cream, egg yolks, and full-fat cheeses.   Healthy desserts include angel food cake, ginger snaps, animal crackers, hard candy, popsicles, and low-fat or nonfat frozen yogurt. Avoid pastries, cakes, pies, and cookies.   Exercise. Follow your exercise programs as directed by your health care provider.   A regular program helps decrease LDL and raise HDL.   A regular program helps with weight control.   Do things that increase your activity level like gardening, walking, or taking the stairs. Ask your health care provider about how you can be more active in your daily life.   Medicine. Take medicine only as directed by your health care provider.   Medicine may be prescribed by your health care provider to help lower cholesterol and decrease the risk for heart disease.   If you have several risk factors, you may need medicine even if your levels are normal.   This information is not intended to replace advice given to you by your health care provider. Make sure you discuss any questions you have with your health care provider.   Document Released: 07/05/2001 Document Revised: 10/31/2014 Document Reviewed: 07/24/2013 Elsevier Interactive Patient Education [employer].

## 2016-01-03 NOTE — Assessment & Plan Note (Signed)
Well controlled, no changes to meds. Encouraged heart healthy diet such as the DASH diet and exercise as tolerated.  °

## 2016-01-03 NOTE — Assessment & Plan Note (Signed)
Encouraged DASH diet, decrease po intake and increase exercise as tolerated. Needs 7-8 hours of sleep nightly. Avoid trans fats, eat small, frequent meals every 4-5 hours with lean proteins, complex carbs and healthy fats. Minimize simple carbs 

## 2016-01-03 NOTE — Assessment & Plan Note (Signed)
Tolerating Coumadin and rate controlled 

## 2016-01-03 NOTE — Assessment & Plan Note (Signed)
Avoid offending foods, start probiotics. Do not eat large meals in late evening and consider raising head of bed.  

## 2016-01-04 NOTE — Telephone Encounter (Signed)
reviewed

## 2016-01-07 ENCOUNTER — Other Ambulatory Visit: Payer: Self-pay | Admitting: Cardiology

## 2016-01-07 DIAGNOSIS — I6523 Occlusion and stenosis of bilateral carotid arteries: Secondary | ICD-10-CM

## 2016-01-14 ENCOUNTER — Ambulatory Visit (HOSPITAL_COMMUNITY)
Admission: RE | Admit: 2016-01-14 | Discharge: 2016-01-14 | Disposition: A | Discharge status: Home / Self Care | Payer: Medicare Other | Source: Ambulatory Visit | Attending: Cardiology | Admitting: Cardiology

## 2016-01-14 ENCOUNTER — Inpatient Hospital Stay (HOSPITAL_COMMUNITY): Admission: RE | Admit: 2016-01-14 | Payer: Medicare Other | Source: Ambulatory Visit

## 2016-01-14 ENCOUNTER — Other Ambulatory Visit: Payer: Self-pay | Admitting: *Deleted

## 2016-01-14 DIAGNOSIS — F329 Major depressive disorder, single episode, unspecified: Secondary | ICD-10-CM | POA: Diagnosis not present

## 2016-01-14 DIAGNOSIS — E785 Hyperlipidemia, unspecified: Secondary | ICD-10-CM | POA: Diagnosis not present

## 2016-01-14 DIAGNOSIS — I252 Old myocardial infarction: Secondary | ICD-10-CM | POA: Insufficient documentation

## 2016-01-14 DIAGNOSIS — I6523 Occlusion and stenosis of bilateral carotid arteries: Secondary | ICD-10-CM | POA: Diagnosis not present

## 2016-01-14 DIAGNOSIS — I2510 Atherosclerotic heart disease of native coronary artery without angina pectoris: Secondary | ICD-10-CM | POA: Diagnosis not present

## 2016-01-14 DIAGNOSIS — I10 Essential (primary) hypertension: Secondary | ICD-10-CM | POA: Insufficient documentation

## 2016-01-14 DIAGNOSIS — K219 Gastro-esophageal reflux disease without esophagitis: Secondary | ICD-10-CM | POA: Insufficient documentation

## 2016-01-14 DIAGNOSIS — G4733 Obstructive sleep apnea (adult) (pediatric): Secondary | ICD-10-CM | POA: Insufficient documentation

## 2016-01-14 MED ORDER — DOFETILIDE 125 MCG PO CAPS: 375.0000 ug | ORAL_CAPSULE | Freq: Two times a day (BID) | ORAL | Status: DC

## 2016-01-17 ENCOUNTER — Other Ambulatory Visit: Payer: Self-pay | Admitting: Cardiology

## 2016-01-18 ENCOUNTER — Telehealth: Payer: Self-pay | Admitting: Family Medicine

## 2016-01-18 MED ORDER — WARFARIN SODIUM 2.5 MG PO TABS: ORAL_TABLET | ORAL | Status: DC

## 2016-01-18 NOTE — Telephone Encounter (Signed)
Relation to [id number] Call back [phone] Pharmacy: [id number] - [place], [place] [address] [phone] (Phone) [phone] (Fax)         Reason for call:  Patient states she's completely out of her warfarin (COUMADIN) 2.5 MG tablet and states [name] office advised since PCP is monitoring INR PCP has to prescribe. Please advise

## 2016-01-18 NOTE — Telephone Encounter (Signed)
Sent in prescription and patient aware 

## 2016-01-19 NOTE — Progress Notes (Signed)
HPI: FU coronary artery disease and atrial fibrillation. Last cardiac catheterization on Mar 06, 2007 showed an ejection fraction of 55%. There is nonobstructive plaque in the LAD, and circumflex and the right coronary artery was normal. 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 Also note the patient is extremely sensitive to Coumadin. MRA of the abdomen in September of 2012 showed no left renal artery stenosis and previous right nephrectomy. 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 Echocardiogram in June of 2014 showed normal LV function, grade 1 diastolic dysfunction and mild left atrial enlargement. Carotid Dopplers in March 2017 showed 1-39% stenosis bilaterally. Since I last saw her, She has occasional brief flutters no recurrent atrial fibrillation. She denies dyspnea, chest pain or syncope. She did state when she converted to sinus rhythm recently she felt transient dizziness.  Current Outpatient Prescriptions  Medication Sig Dispense Refill  . amLODipine (NORVASC) 10 MG tablet TAKE 1 TABLET (10 MG TOTAL) BY MOUTH DAILY. 30 tablet 0  . ammonium lactate (LAC-HYDRIN) 12 % lotion Apply 1 application topically daily as needed. Reported on 12/14/2015    . dofetilide (TIKOSYN) 125 MCG capsule Take 3 capsules (375 mcg total) by mouth 2 (two) times daily. 180 capsule 6  . escitalopram (LEXAPRO) 10 MG tablet Take 1 tablet (10 mg total) by mouth daily. 30 tablet 5  . hydrocortisone (ANUSOL-HC) 25 MG suppository Place 1 suppository (25 mg total) rectally at bedtime as needed for hemorrhoids or itching. 12 suppository 2  . InFLIXimab (REMICADE IV) Inject into the vein as directed.    . loperamide (IMODIUM A-D) 2 MG tablet Take 1 mg by mouth daily as needed. Dihrrhea    . losartan (COZAAR) 100 MG tablet TAKE 1 TABLET (100 MG TOTAL) BY MOUTH DAILY. 30 tablet 9  . magnesium gluconate  (MAGONATE) 500 MG tablet Take 500 mg by mouth daily.    . metoprolol tartrate (LOPRESSOR) 25 MG tablet Take 1 tablet (25 mg total) by mouth 2 (two) times daily. (Patient taking differently: Take 12.5 mg by mouth 2 (two) times daily. ) 180 tablet 3  . Multiple Vitamin (MULTIVITAMIN) capsule Take 1 capsule by mouth daily. 1 daily      . nitroGLYCERIN (NITROSTAT) 0.4 MG SL tablet Place 1 tablet (0.4 mg total) under the tongue every 5 (five) minutes as needed. 1 tab under tongue every 5 min for chest pain not to exceed 3 tabs in 15 min 25 tablet 3  . Probiotic Product ([place]) CAPS Take 1 capsule by mouth daily. 1 per day    . warfarin (COUMADIN) 2.5 MG tablet Take 1-1.5 tablets by mouth daily as directed by coumadin clinic 40 tablet 1   No current facility-administered medications for this visit.     Past Medical History  Diagnosis Date  . CAD (coronary artery disease)   . Depression   . GERD (gastroesophageal reflux disease)   . Hyperlipidemia   . Hypertension   . Crohn's colitis ([place])     she reports ulcerative colitis beginning in her 40's, biopsies 2008 suggest Crohn's not UC  . Pseudoaneurysm of right femoral artery ([place])   . Pancreatitis   . Chronic sinusitis   . Renal oncocytoma     s/p right nephrectomy  . Myocardial infarction ([place])   . Uveitis   . Anxiety   . Obesity   .  Chronic cough   . OSA (obstructive sleep apnea)   . Paroxysmal atrial fibrillation ([facility])   . Arthritis   . IBS (irritable bowel syndrome)   . Blood transfusion without reported diagnosis 1976  . Sleep apnea   . Keloid of skin   . Medicare annual wellness visit, subsequent 08/17/2012    Past Surgical History  Procedure Laterality Date  . Nephrectomy  10/2006    right secondary to oncocytoma   . Breast biopsy  1996    benign lesion   . Cholecystectomy  10/2006  . Tubal ligation    . Right renal artery repair  1976  . Right femoral  pseudoaneurysm & right groin hematoma evacuation   02/2007  . Colonoscopy w/ biopsies    . Esophagogastroduodenoscopy    . Coronary angioplasty    . Cataract surgery Left 11/13/14    Social History   Social History  . Marital Status: Married    Spouse Name: N/A  . Number of Children: 3  . Years of Education: N/A   Occupational History  . Retired     retired   Social History Main Topics  . Smoking status: Never Smoker   . Smokeless tobacco: Never Used  . Alcohol Use: No  . Drug Use: No  . Sexual Activity: Not Currently   Other Topics Concern  . 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 Colitis Father   . Hypertension Mother   . Coronary artery disease Mother   . Stroke Mother   . Stroke Brother   . Colon cancer Paternal Aunt   . Clotting disorder Mother   . Clotting disorder Brother   . Clotting disorder Sister   . Heart disease Mother   . Heart disease Father   . Asthma Daughter   . Allergies Mother   . Asthma Maternal Uncle   . Colon cancer Paternal Aunt   . Colon polyps Neg Hx   . Kidney disease Neg Hx     ROS: no fevers or chills, productive cough, hemoptysis, dysphasia, odynophagia, melena, hematochezia, dysuria, hematuria, rash, seizure activity, orthopnea, PND, pedal edema, claudication. Remaining systems are negative.  Physical Exam: Well-developed obese in no acute distress.  Skin is warm and dry.  HEENT is normal.  Neck is supple.  Chest is clear to auscultation with normal expansion.  Cardiovascular exam is regular rate and rhythm.  Abdominal exam nontender or distended. No masses palpated. Extremities show no edema. neuro grossly intact

## 2016-01-20 ENCOUNTER — Encounter: Payer: Self-pay | Admitting: Cardiology

## 2016-01-20 ENCOUNTER — Ambulatory Visit (INDEPENDENT_AMBULATORY_CARE_PROVIDER_SITE_OTHER): Payer: Medicare Other | Admitting: Cardiology

## 2016-01-20 VITALS — BP 126/76 | HR 52 | Ht 63.0 in | Wt 241.4 lb

## 2016-01-20 DIAGNOSIS — I480 Paroxysmal atrial fibrillation: Secondary | ICD-10-CM | POA: Diagnosis not present

## 2016-01-20 DIAGNOSIS — I6523 Occlusion and stenosis of bilateral carotid arteries: Secondary | ICD-10-CM | POA: Diagnosis not present

## 2016-01-20 DIAGNOSIS — I10 Essential (primary) hypertension: Secondary | ICD-10-CM | POA: Diagnosis not present

## 2016-01-20 DIAGNOSIS — E669 Obesity, unspecified: Secondary | ICD-10-CM

## 2016-01-20 DIAGNOSIS — I2510 Atherosclerotic heart disease of native coronary artery without angina pectoris: Secondary | ICD-10-CM

## 2016-01-20 DIAGNOSIS — E785 Hyperlipidemia, unspecified: Secondary | ICD-10-CM

## 2016-01-20 DIAGNOSIS — I679 Cerebrovascular disease, unspecified: Secondary | ICD-10-CM

## 2016-01-20 MED ORDER — NITROGLYCERIN 0.4 MG SL SUBL: 0.4000 mg | SUBLINGUAL_TABLET | SUBLINGUAL | Status: DC | PRN

## 2016-01-20 NOTE — Patient Instructions (Signed)
Your physician wants you to follow-up in: 6 MONTHS WITH DR CRENSHAW You will receive a reminder letter in the mail two months in advance. If you don't receive a letter, please call our office to schedule the follow-up appointment.   If you need a refill on your cardiac medications before your next appointment, please call your pharmacy.  

## 2016-01-20 NOTE — Assessment & Plan Note (Signed)
No aspirin given the Coumadin. Intolerant to statins.

## 2016-01-20 NOTE — Assessment & Plan Note (Signed)
Intolerant to statins. Continue diet. 

## 2016-01-20 NOTE — Assessment & Plan Note (Addendum)
Patient in sinus rhythm on examination today. Continue Coumadin. Continue tikosyn. Continue metoprolol. Note patient has had occasional brief palpitationsShe feels is not like her atrial fibrillation. She had transient dizziness after converting to sinus rhythm recently. Question posttermination pauses. She has not had syncope. We will plan an event monitor in the future if her symptoms worsen.

## 2016-01-20 NOTE — Assessment & Plan Note (Signed)
Blood pressure controlled. Continue present medications. 

## 2016-01-20 NOTE — Assessment & Plan Note (Signed)
No aspirin given need for Coumadin. Intolerant to statins. 

## 2016-01-20 NOTE — Assessment & Plan Note (Signed)
Needs weight loss. 

## 2016-01-25 DIAGNOSIS — M0589 Other rheumatoid arthritis with rheumatoid factor of multiple sites: Secondary | ICD-10-CM | POA: Diagnosis not present

## 2016-01-26 ENCOUNTER — Ambulatory Visit (INDEPENDENT_AMBULATORY_CARE_PROVIDER_SITE_OTHER): Payer: Medicare Other | Admitting: *Deleted

## 2016-01-26 DIAGNOSIS — Z5181 Encounter for therapeutic drug level monitoring: Secondary | ICD-10-CM

## 2016-01-26 DIAGNOSIS — I4891 Unspecified atrial fibrillation: Secondary | ICD-10-CM

## 2016-01-26 LAB — POCT INR: INR: 2.5

## 2016-01-26 NOTE — Patient Instructions (Signed)
Continue taking 1 tablet daily, except 1.5 tablets on Tuesdays and Saturdays. Recheck INR in 4 weeks.

## 2016-01-26 NOTE — Progress Notes (Signed)
Pre visit review using our clinic review tool, if applicable. No additional management support is needed unless otherwise documented below in the visit note.  INR today 2.5.  Continue taking 1 tablet daily, except 1.5 tablets on Tuesdays and Saturdays. Recheck INR in 4 weeks.  Pt aware of instructions and verbalized understanding.   Next appt: 02/23/16  Dorrene German, RN

## 2016-02-22 ENCOUNTER — Other Ambulatory Visit: Payer: Self-pay | Admitting: Cardiology

## 2016-02-22 NOTE — Telephone Encounter (Signed)
REFILL 

## 2016-02-23 ENCOUNTER — Ambulatory Visit (INDEPENDENT_AMBULATORY_CARE_PROVIDER_SITE_OTHER): Payer: Medicare Other | Admitting: *Deleted

## 2016-02-23 DIAGNOSIS — I4891 Unspecified atrial fibrillation: Secondary | ICD-10-CM | POA: Diagnosis not present

## 2016-02-23 DIAGNOSIS — Z5181 Encounter for therapeutic drug level monitoring: Secondary | ICD-10-CM

## 2016-02-23 LAB — POCT INR: INR: 2.7

## 2016-02-23 NOTE — Patient Instructions (Signed)
Continue taking 1 tablet daily, except 1.5 tablets on Tuesdays and Saturdays. Recheck INR in 4 weeks.

## 2016-02-23 NOTE — Progress Notes (Signed)
Pre visit review using our clinic review tool, if applicable. No additional management support is needed unless otherwise documented below in the visit note.  INR today 2.7. Pt findings negative.  Continue taking 1 tablet daily, except 1.5 tablets on Tuesdays and Saturdays. Recheck INR in 4 weeks.  Pt aware of instructions and verbalized understanding.  Dorrene German, RN

## 2016-03-03 DIAGNOSIS — R269 Unspecified abnormalities of gait and mobility: Secondary | ICD-10-CM | POA: Diagnosis not present

## 2016-03-03 DIAGNOSIS — Z85828 Personal history of other malignant neoplasm of skin: Secondary | ICD-10-CM | POA: Diagnosis not present

## 2016-03-03 DIAGNOSIS — M545 Low back pain: Secondary | ICD-10-CM | POA: Diagnosis not present

## 2016-03-03 DIAGNOSIS — Q600 Renal agenesis, unilateral: Secondary | ICD-10-CM | POA: Diagnosis not present

## 2016-03-03 DIAGNOSIS — K5090 Crohn's disease, unspecified, without complications: Secondary | ICD-10-CM | POA: Diagnosis not present

## 2016-03-03 DIAGNOSIS — M65322 Trigger finger, left index finger: Secondary | ICD-10-CM | POA: Diagnosis not present

## 2016-03-03 DIAGNOSIS — H209 Unspecified iridocyclitis: Secondary | ICD-10-CM | POA: Diagnosis not present

## 2016-03-03 DIAGNOSIS — Z79899 Other long term (current) drug therapy: Secondary | ICD-10-CM | POA: Diagnosis not present

## 2016-03-03 DIAGNOSIS — M0589 Other rheumatoid arthritis with rheumatoid factor of multiple sites: Secondary | ICD-10-CM | POA: Diagnosis not present

## 2016-03-07 ENCOUNTER — Other Ambulatory Visit: Payer: Self-pay | Admitting: Nurse Practitioner

## 2016-03-14 ENCOUNTER — Other Ambulatory Visit: Payer: Self-pay | Admitting: Family Medicine

## 2016-03-17 DIAGNOSIS — L821 Other seborrheic keratosis: Secondary | ICD-10-CM | POA: Diagnosis not present

## 2016-03-22 ENCOUNTER — Ambulatory Visit: Payer: Medicare Other

## 2016-03-22 DIAGNOSIS — M0589 Other rheumatoid arthritis with rheumatoid factor of multiple sites: Secondary | ICD-10-CM | POA: Diagnosis not present

## 2016-03-23 ENCOUNTER — Ambulatory Visit (INDEPENDENT_AMBULATORY_CARE_PROVIDER_SITE_OTHER): Payer: Medicare Other | Admitting: *Deleted

## 2016-03-23 DIAGNOSIS — I4891 Unspecified atrial fibrillation: Secondary | ICD-10-CM

## 2016-03-23 DIAGNOSIS — Z5181 Encounter for therapeutic drug level monitoring: Secondary | ICD-10-CM

## 2016-03-23 LAB — POCT INR: INR: 3.4

## 2016-03-23 NOTE — Patient Instructions (Signed)
Per Mackie Pai, PA-C: Hold coumadin 2.5 mg today and Friday. Return for INR check in 1 week.

## 2016-03-23 NOTE — Progress Notes (Addendum)
Pre visit review using our clinic review tool, if applicable. No additional management support is needed unless otherwise documented below in the visit note.  INR today 3.4. Pt received Remicade infusion yesterday, pt findings otherwise negative.   Per Mackie Pai, PA-C: Hold coumadin 2.5 mg today and Friday. Return for INR check in 1 week.   Pt verbalized understanding of instructions.  Next appt: 03/31/16  Dorrene German, RN   Did advise above for wed and friday changes. The other days changes made to her regimen.  [place], Percell Miller, PA-C

## 2016-03-27 ENCOUNTER — Encounter: Payer: Self-pay | Admitting: Nurse Practitioner

## 2016-03-31 ENCOUNTER — Ambulatory Visit (INDEPENDENT_AMBULATORY_CARE_PROVIDER_SITE_OTHER): Payer: Medicare Other | Admitting: *Deleted

## 2016-03-31 DIAGNOSIS — Z5181 Encounter for therapeutic drug level monitoring: Secondary | ICD-10-CM

## 2016-03-31 DIAGNOSIS — I4891 Unspecified atrial fibrillation: Secondary | ICD-10-CM | POA: Diagnosis not present

## 2016-03-31 LAB — POCT INR: INR: 2.2

## 2016-03-31 NOTE — Progress Notes (Signed)
Pre visit review using our clinic review tool, if applicable. No additional management support is needed unless otherwise documented below in the visit note.  INR today 2.2.  Per Dr. Charlett Blake: Start taking coumadin 2.5 mg (1 tablet) everyday, except on Saturdays take 3.75 mg (1.5 tablets). Return for INR check in 2 weeks.  Discussed w/ pt that Dr. Charlett Blake is not aware of any interactions between coumadin and magnesium and that she could continue to take supplement if she wished. She verbalized understanding.  Next appt: 04/14/16  Dorrene German, RN

## 2016-03-31 NOTE — Progress Notes (Signed)
RN note reviewed. Agree with documention and plan. 

## 2016-03-31 NOTE — Patient Instructions (Signed)
Per Dr. Charlett Blake: Start taking coumadin 2.5 mg (1 tablet) everyday, except on Saturdays take 3.75 mg (1.5 tablets). Return for INR check in 2 weeks.

## 2016-04-14 ENCOUNTER — Ambulatory Visit (INDEPENDENT_AMBULATORY_CARE_PROVIDER_SITE_OTHER): Payer: Medicare Other | Admitting: Behavioral Health

## 2016-04-14 DIAGNOSIS — I4891 Unspecified atrial fibrillation: Secondary | ICD-10-CM | POA: Diagnosis not present

## 2016-04-14 DIAGNOSIS — Z5181 Encounter for therapeutic drug level monitoring: Secondary | ICD-10-CM | POA: Diagnosis not present

## 2016-04-14 LAB — POCT INR: INR: 1.8

## 2016-04-14 NOTE — Patient Instructions (Signed)
Per Dr. Charlett Blake: Continue taking Coumadin 2.5 mg (1 tablet) everyday, except on Saturdays take 3.75 mg (1.5 tablets). Return for INR check in 2 weeks.

## 2016-04-14 NOTE — Progress Notes (Signed)
Pre visit review using our clinic review tool, if applicable. No additional management support is needed unless otherwise documented below in the visit note.  Patient presents in office for INR check. Today's reading was 1.8. She did not report any positive findings. Also, patient verbalized staying consistent with her greens intake and not consuming foods that are high in vitamin K.   Per Dr. Charlett Blake: Continue taking Coumadin 2.5 mg (1 tablet) everyday, except on Saturdays take 3.75 mg (1.5 tablets). Return for INR check in 2 weeks.  Patient was made aware of the provider's instructions. She voiced understanding and did not have any concerns prior to leaving the nurse visit.  Next appointment scheduled for 04/28/16 at 10:00 AM.

## 2016-04-28 ENCOUNTER — Ambulatory Visit (INDEPENDENT_AMBULATORY_CARE_PROVIDER_SITE_OTHER): Payer: Medicare Other

## 2016-04-28 DIAGNOSIS — Z5181 Encounter for therapeutic drug level monitoring: Secondary | ICD-10-CM

## 2016-04-28 DIAGNOSIS — I4891 Unspecified atrial fibrillation: Secondary | ICD-10-CM | POA: Diagnosis not present

## 2016-04-28 LAB — POCT INR: INR: 2.2

## 2016-04-28 NOTE — Patient Instructions (Addendum)
Patient to continue Coumadin 2.5 mg all days except Saturday take 3.75 mg (1&1/2 tab). Return to office for recheck of INR on August 4,2017 at 10:00 am.

## 2016-04-28 NOTE — Progress Notes (Signed)
Pre visit review using our clinic tool,if applicable. No additional management support is needed unless otherwise documented below in the visit note.   Patient in for INR check due to history of Atrial Fibrillation. INR today = 2.2. Per Dr. Charlett Blake patient to continue current dose of Warfarin 2.5mg  all days except for Saturday patient to take 2.75mg  and return for INR recheck in 1 month.   Patient notified, appointment scheduled for August 4th at 10:00 am.

## 2016-04-28 NOTE — Progress Notes (Signed)
RN note reviewed. Agree with documention and plan. 

## 2016-04-30 NOTE — Progress Notes (Signed)
RN note reviewed. Agree with documention and plan. 

## 2016-05-06 ENCOUNTER — Ambulatory Visit (INDEPENDENT_AMBULATORY_CARE_PROVIDER_SITE_OTHER): Payer: Medicare Other | Admitting: Adult Health

## 2016-05-06 ENCOUNTER — Encounter: Payer: Self-pay | Admitting: Adult Health

## 2016-05-06 VITALS — BP 126/88 | HR 53 | Temp 98.0°F | Ht 62.0 in | Wt 242.0 lb

## 2016-05-06 DIAGNOSIS — G4733 Obstructive sleep apnea (adult) (pediatric): Secondary | ICD-10-CM | POA: Diagnosis not present

## 2016-05-06 DIAGNOSIS — I6523 Occlusion and stenosis of bilateral carotid arteries: Secondary | ICD-10-CM

## 2016-05-06 NOTE — Addendum Note (Signed)
Addended by: Osa Craver on: 05/06/2016 11:00 AM   Modules accepted: Orders

## 2016-05-06 NOTE — Patient Instructions (Signed)
Continue on CPAP At bedtime   Keep up good work  Do not drive if sleepy.  Work on weight loss.  Follow up with Dr. Alva  In 1 year and As needed   Please contact office for sooner follow up if symptoms do not improve or worsen or seek emergency care   

## 2016-05-06 NOTE — Assessment & Plan Note (Signed)
Well controlled on CPAP   Plan  Continue on CPAP At bedtime   Keep up good work  Do not drive if sleepy.  Work on weight loss.  Follow up with Dr. Elsworth Soho  In 1 year and As needed   Please contact office for sooner follow up if symptoms do not improve or worsen or seek emergency care

## 2016-05-06 NOTE — Progress Notes (Signed)
Subjective:    Patient ID: [name], female    DOB: [birth year], 70 y.o.   MRN: [id number]  HPI 70 yo female with severe OSA on CPAP  Sees dr Amil Amen for RA - on remicaide  TEST  PSG 2008 >. AHI 36/h Auto 2012 - optimal pr 12 cm , FF mask   71/14/2017 Follow up : OSA  Pt returns for 70 year follow up for sleep apnea.  She is doing very well on CPAP .  Wears for at least 8hr each night.  Download shows excellent compliance with avg usage 8hrs, AHI 2.0, min leaks.  Denies chest pain, orthopnea, edema or fever  Past Medical History  Diagnosis Date  . CAD (coronary artery disease)   . Depression   . GERD (gastroesophageal reflux disease)   . Hyperlipidemia   . Hypertension   . Crohn's colitis ([place])     she reports ulcerative colitis beginning in her 5's, biopsies 2008 suggest Crohn's not UC  . 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 Pancreatitis   . Renal oncocytoma     s/p right nephrectomy  . Anxiety   . Obesity   . OSA (obstructive sleep apnea)   . Paroxysmal atrial fibrillation ([facility])   . Arthritis   . Keloid of skin    Current Outpatient Prescriptions on File Prior to Visit  Medication Sig Dispense Refill  . amLODipine (NORVASC) 10 MG tablet TAKE 1 TABLET (10 MG TOTAL) BY MOUTH DAILY. 30 tablet 5  . ammonium lactate (LAC-HYDRIN) 12 % lotion Apply 1 application topically daily as needed. Reported on 12/14/2015    . dofetilide (TIKOSYN) 125 MCG capsule Take 3 capsules (375 mcg total) by mouth 2 (two) times daily. 180 capsule 6  . escitalopram (LEXAPRO) 10 MG tablet Take 1 tablet (10 mg total) by mouth daily. 30 tablet 5  . hydrocortisone (ANUSOL-HC) 25 MG suppository Place 1 suppository (25 mg total) rectally at bedtime as needed for hemorrhoids or itching. 12 suppository 2  . InFLIXimab (REMICADE IV) Inject into the vein as directed.    . loperamide (IMODIUM A-D) 2 MG tablet Take 1 mg by mouth daily as needed. Dihrrhea    . losartan (COZAAR) 100 MG  tablet TAKE 1 TABLET (100 MG TOTAL) BY MOUTH DAILY. 30 tablet 6  . magnesium gluconate (MAGONATE) 500 MG tablet Take 250 mg by mouth daily. Reported on 03/31/2016    . metoprolol tartrate (LOPRESSOR) 25 MG tablet Take 1 tablet (25 mg total) by mouth 2 (two) times daily. (Patient taking differently: Take 12.5 mg by mouth 2 (two) times daily. ) 180 tablet 3  . Multiple Vitamin (MULTIVITAMIN) capsule Take 1 capsule by mouth daily. 1 daily      . nitroGLYCERIN (NITROSTAT) 0.4 MG SL tablet Place 1 tablet (0.4 mg total) under the tongue every 5 (five) minutes as needed. 1 tab under tongue every 5 min for chest pain not to exceed 3 tabs in 15 min 25 tablet 6  . Probiotic Product ([place]) CAPS Take 1 capsule by mouth daily. 1 per day    . warfarin (COUMADIN) 2.5 MG tablet TAKE 1-1.5 TABLETS BY MOUTH DAILY AS DIRECTED BY COUMADIN CLINIC 40 tablet 1   No current facility-administered medications on file prior to visit.    Review of Systems  neg for any significant sore throat, dysphagia, itching, sneezing, nasal congestion or excess/ purulent secretions, fever, chills, sweats, unintended wt loss, pleuritic or exertional cp, hempoptysis,  orthopnea pnd or change in chronic leg swelling. Also denies presyncope, palpitations, heartburn, abdominal pain, nausea, vomiting, diarrhea or change in bowel or urinary habits, dysuria,hematuria, rash, arthralgias, visual complaints, headache, numbness weakness or ataxia.     Objective:   Physical Exam  Filed Vitals:   05/06/16 1035  BP: 126/88  Pulse: 53  Temp: 98 F (36.7 C)  TempSrc: Oral  Height: 5\' 2"  (1.575 m)  Weight: 242 lb (109.77 kg)  SpO2: 96%   Body mass index is 44.25 kg/(m^2).   Gen. Pleasant, obese, in no distress ENT - no lesions, no post nasal drip, Class 2-3 MP airway  Neck: No JVD, no thyromegaly, no carotid bruits Lungs: no use of accessory muscles, no dullness to percussion, decreased without rales or rhonchi    Cardiovascular: Rhythm regular, heart sounds  normal, no murmurs or gallops, no peripheral edema Musculoskeletal: No deformities, no cyanosis or clubbing , no tremors  [name]  [facility]  71/14/2017     Assessment & Plan:

## 2016-05-10 ENCOUNTER — Other Ambulatory Visit: Payer: Self-pay | Admitting: Family Medicine

## 2016-05-16 ENCOUNTER — Encounter: Payer: Self-pay | Admitting: Adult Health

## 2016-05-16 DIAGNOSIS — M0589 Other rheumatoid arthritis with rheumatoid factor of multiple sites: Secondary | ICD-10-CM | POA: Diagnosis not present

## 2016-05-27 ENCOUNTER — Ambulatory Visit (INDEPENDENT_AMBULATORY_CARE_PROVIDER_SITE_OTHER): Payer: Medicare Other | Admitting: Behavioral Health

## 2016-05-27 DIAGNOSIS — I4891 Unspecified atrial fibrillation: Secondary | ICD-10-CM | POA: Diagnosis not present

## 2016-05-27 DIAGNOSIS — Z5181 Encounter for therapeutic drug level monitoring: Secondary | ICD-10-CM | POA: Diagnosis not present

## 2016-05-27 LAB — POCT INR: INR: 3.8

## 2016-05-27 NOTE — Progress Notes (Signed)
Pre visit review using our clinic review tool, if applicable. No additional management support is needed unless otherwise documented below in the visit note.  Patient presents in office for INR check. Today's reading was 3.8. Reviewed medications with the patient. She did not report any changes with medications/diet or abnormal bleeding.  Per Dr. Charlett Blake: Hold Coumadin today & tomorrow and then beginning on Sunday (05/29/16) take Coumadin 2.5 mg each day. Return for INR check a week from Monday (06/06/16).  Informed patient of the provider's instructions. She verbalized understanding and did not have any questions or concerns before leaving the nurse visit.  Next appointment scheduled for 06/07/16 at 2:30 PM.

## 2016-05-27 NOTE — Patient Instructions (Addendum)
Per Dr. Charlett Blake: Hold Coumadin today & tomorrow and then beginning on Sunday (05/29/16) take Coumadin 2.5 mg each day. Return for INR check a week from Monday (06/06/16).

## 2016-06-07 ENCOUNTER — Ambulatory Visit (INDEPENDENT_AMBULATORY_CARE_PROVIDER_SITE_OTHER): Payer: Medicare Other | Admitting: *Deleted

## 2016-06-07 DIAGNOSIS — I4891 Unspecified atrial fibrillation: Secondary | ICD-10-CM

## 2016-06-07 DIAGNOSIS — Z5181 Encounter for therapeutic drug level monitoring: Secondary | ICD-10-CM

## 2016-06-07 LAB — POCT INR: INR: 3.2

## 2016-06-07 NOTE — Progress Notes (Signed)
Pre visit review using our clinic review tool, if applicable. No additional management support is needed unless otherwise documented below in the visit note.  INR today 3.2. Pt findings negative.   Per Dr. Charlett Blake: Continue taking coumadin 2.5 mg daily. Recheck INR in 1 week.  Patient verbalized understanding of instructions.   Next appt: 06/14/16  Dorrene German, RN

## 2016-06-07 NOTE — Patient Instructions (Signed)
Per Dr. Charlett Blake: Continue taking coumadin 2.5 mg daily. Recheck INR in 1 week.

## 2016-06-14 ENCOUNTER — Ambulatory Visit (INDEPENDENT_AMBULATORY_CARE_PROVIDER_SITE_OTHER): Payer: Medicare Other | Admitting: *Deleted

## 2016-06-14 DIAGNOSIS — Z5181 Encounter for therapeutic drug level monitoring: Secondary | ICD-10-CM | POA: Diagnosis not present

## 2016-06-14 DIAGNOSIS — I4891 Unspecified atrial fibrillation: Secondary | ICD-10-CM | POA: Diagnosis not present

## 2016-06-14 LAB — POCT INR: INR: 3.6

## 2016-06-14 NOTE — Patient Instructions (Signed)
Per Dr. Charlett Blake: Start taking coumadin 2.5 mg (1 tablet) daily, except take 1.25 mg (0.5 tablet) on Tuesdays and Saturdays. Recheck INR in 1 week.

## 2016-06-14 NOTE — Progress Notes (Signed)
Pre visit review using our clinic review tool, if applicable. No additional management support is needed unless otherwise documented below in the visit note.  INR today 3.6. Pt findings negative.   Per Dr. Charlett Blake: Start taking coumadin 2.5 mg (1 tablet) daily, except take 1.25 mg (0.5 tablet) on Tuesdays and Saturdays. Recheck INR in 1 week.   Next appointment: 06/20/16  Dorrene German, RN

## 2016-06-20 ENCOUNTER — Ambulatory Visit (INDEPENDENT_AMBULATORY_CARE_PROVIDER_SITE_OTHER): Payer: Medicare Other | Admitting: Family Medicine

## 2016-06-20 ENCOUNTER — Other Ambulatory Visit: Payer: Self-pay | Admitting: Family Medicine

## 2016-06-20 ENCOUNTER — Ambulatory Visit (HOSPITAL_BASED_OUTPATIENT_CLINIC_OR_DEPARTMENT_OTHER)
Admission: RE | Admit: 2016-06-20 | Discharge: 2016-06-20 | Disposition: A | Discharge status: Home / Self Care | Payer: Medicare Other | Source: Ambulatory Visit | Attending: Family Medicine | Admitting: Family Medicine

## 2016-06-20 VITALS — BP 126/78 | HR 51 | Temp 98.1°F | Wt 240.6 lb

## 2016-06-20 DIAGNOSIS — M6248 Contracture of muscle, other site: Secondary | ICD-10-CM | POA: Diagnosis not present

## 2016-06-20 DIAGNOSIS — M62838 Other muscle spasm: Secondary | ICD-10-CM

## 2016-06-20 DIAGNOSIS — M25511 Pain in right shoulder: Secondary | ICD-10-CM

## 2016-06-20 DIAGNOSIS — E785 Hyperlipidemia, unspecified: Secondary | ICD-10-CM | POA: Diagnosis not present

## 2016-06-20 DIAGNOSIS — M19011 Primary osteoarthritis, right shoulder: Secondary | ICD-10-CM | POA: Insufficient documentation

## 2016-06-20 DIAGNOSIS — I6523 Occlusion and stenosis of bilateral carotid arteries: Secondary | ICD-10-CM

## 2016-06-20 DIAGNOSIS — M542 Cervicalgia: Secondary | ICD-10-CM | POA: Diagnosis not present

## 2016-06-20 DIAGNOSIS — R739 Hyperglycemia, unspecified: Secondary | ICD-10-CM | POA: Diagnosis not present

## 2016-06-20 DIAGNOSIS — Z7901 Long term (current) use of anticoagulants: Secondary | ICD-10-CM | POA: Diagnosis not present

## 2016-06-20 DIAGNOSIS — M25519 Pain in unspecified shoulder: Secondary | ICD-10-CM

## 2016-06-20 DIAGNOSIS — I4891 Unspecified atrial fibrillation: Secondary | ICD-10-CM | POA: Diagnosis not present

## 2016-06-20 DIAGNOSIS — M810 Age-related osteoporosis without current pathological fracture: Secondary | ICD-10-CM | POA: Diagnosis not present

## 2016-06-20 DIAGNOSIS — E669 Obesity, unspecified: Secondary | ICD-10-CM

## 2016-06-20 DIAGNOSIS — Z5181 Encounter for therapeutic drug level monitoring: Secondary | ICD-10-CM

## 2016-06-20 LAB — COMPREHENSIVE METABOLIC PANEL
ALBUMIN: 4 g/dL (ref 3.5–5.2)
ALK PHOS: 70 U/L (ref 39–117)
ALT: 19 U/L (ref 0–35)
AST: 23 U/L (ref 0–37)
BUN: 16 mg/dL (ref 6–23)
CALCIUM: 9.4 mg/dL (ref 8.4–10.5)
CHLORIDE: 104 meq/L (ref 96–112)
CO2: 29 mEq/L (ref 19–32)
CREATININE: 1.02 mg/dL (ref 0.40–1.20)
GFR: 56.81 mL/min — ABNORMAL LOW (ref >60.00)
Glucose, Bld: 96 mg/dL (ref 70–99)
Potassium: 4.5 mEq/L (ref 3.5–5.1)
Sodium: 142 mEq/L (ref 135–145)
TOTAL PROTEIN: 8 g/dL (ref 6.0–8.3)
Total Bilirubin: 0.6 mg/dL (ref 0.2–1.2)

## 2016-06-20 LAB — POCT INR: INR: 2.6

## 2016-06-20 LAB — CBC
HEMATOCRIT: 42.3 % (ref 36.0–46.0)
Hemoglobin: 14.1 g/dL (ref 12.0–15.0)
MCHC: 33.4 g/dL (ref 30.0–36.0)
MCV: 93.8 fl (ref 78.0–100.0)
PLATELETS: 234 10*3/uL (ref 150.0–400.0)
RBC: 4.51 Mil/uL (ref 3.87–5.11)
RDW: 14.1 % (ref 11.5–15.5)
WBC: 9 10*3/uL (ref 4.0–10.5)

## 2016-06-20 LAB — LIPID PANEL
Cholesterol: 172 mg/dL (ref 0–200)
HDL: 45.8 mg/dL (ref >39.00)
LDL CALC: 99 mg/dL (ref 0–99)
NONHDL: 126.67
Total CHOL/HDL Ratio: 4
Triglycerides: 136 mg/dL (ref 0.0–149.0)
VLDL: 27.2 mg/dL (ref 0.0–40.0)

## 2016-06-20 LAB — HEMOGLOBIN A1C: Hgb A1c MFr Bld: 5.4 % (ref 4.6–6.5)

## 2016-06-20 LAB — VITAMIN D 25 HYDROXY (VIT D DEFICIENCY, FRACTURES): VITD: 37.5 ng/mL (ref 30.00–100.00)

## 2016-06-20 LAB — TSH: TSH: 3.12 u[IU]/mL (ref 0.35–4.50)

## 2016-06-20 MED ORDER — TIZANIDINE HCL 4 MG PO TABS: 4.0000 mg | ORAL_TABLET | Freq: Two times a day (BID) | ORAL | 1 refills | Status: DC | PRN

## 2016-06-20 NOTE — Assessment & Plan Note (Signed)
Well controlled, no changes to meds. Encouraged heart healthy diet such as the DASH diet and exercise as tolerated.  °

## 2016-06-20 NOTE — Progress Notes (Signed)
Pre visit review using our clinic review tool, if applicable. No additional management support is needed unless otherwise documented below in the visit note. 

## 2016-06-20 NOTE — Patient Instructions (Signed)
Lidocaine patch to shoulder daily  Shoulder Pain The shoulder is the joint that connects your arms to your body. The bones that form the shoulder joint include the upper arm bone (humerus), the shoulder blade (scapula), and the collarbone (clavicle). The top of the humerus is shaped like a ball and fits into a rather flat socket on the scapula (glenoid cavity). A combination of muscles and strong, fibrous tissues that connect muscles to bones (tendons) support your shoulder joint and hold the ball in the socket. Small, fluid-filled sacs (bursae) are located in different areas of the joint. They act as cushions between the bones and the overlying soft tissues and help reduce friction between the gliding tendons and the bone as you move your arm. Your shoulder joint allows a wide range of motion in your arm. This range of motion allows you to do things like scratch your back or throw a ball. However, this range of motion also makes your shoulder more prone to pain from overuse and injury. Causes of shoulder pain can originate from both injury and overuse and usually can be grouped in the following four categories:  Redness, swelling, and pain (inflammation) of the tendon (tendinitis) or the bursae (bursitis).  Instability, such as a dislocation of the joint.  Inflammation of the joint (arthritis).  Broken bone (fracture). HOME CARE INSTRUCTIONS   Apply ice to the sore area.  Put ice in a plastic bag.  Place a towel between your skin and the bag.  Leave the ice on for 15-20 minutes, 3-4 times per day for the first 2 days, or as directed by your health care provider.  Stop using cold packs if they do not help with the pain.  If you have a shoulder sling or immobilizer, wear it as long as your caregiver instructs. Only remove it to shower or bathe. Move your arm as little as possible, but keep your hand moving to prevent swelling.  Squeeze a soft ball or foam pad as much as possible to help  prevent swelling.  Only take over-the-counter or prescription medicines for pain, discomfort, or fever as directed by your caregiver. SEEK MEDICAL CARE IF:   Your shoulder pain increases, or new pain develops in your arm, hand, or fingers.  Your hand or fingers become cold and numb.  Your pain is not relieved with medicines. SEEK IMMEDIATE MEDICAL CARE IF:   Your arm, hand, or fingers are numb or tingling.  Your arm, hand, or fingers are significantly swollen or turn white or blue. MAKE SURE YOU:   Understand these instructions.  Will watch your condition.  Will get help right away if you are not doing well or get worse.   This information is not intended to replace advice given to you by your health care provider. Make sure you discuss any questions you have with your health care provider.   Document Released: 07/20/2005 Document Revised: 10/31/2014 Document Reviewed: 02/02/2015 Elsevier Interactive Patient Education [employer].

## 2016-06-26 DIAGNOSIS — J0100 Acute maxillary sinusitis, unspecified: Secondary | ICD-10-CM | POA: Diagnosis not present

## 2016-06-26 DIAGNOSIS — H6691 Otitis media, unspecified, right ear: Secondary | ICD-10-CM | POA: Diagnosis not present

## 2016-06-27 ENCOUNTER — Other Ambulatory Visit: Payer: Self-pay | Admitting: Family Medicine

## 2016-06-28 ENCOUNTER — Other Ambulatory Visit: Payer: Self-pay | Admitting: Cardiology

## 2016-06-28 NOTE — Telephone Encounter (Signed)
Rx(s) sent to pharmacy electronically.  

## 2016-06-29 ENCOUNTER — Ambulatory Visit (INDEPENDENT_AMBULATORY_CARE_PROVIDER_SITE_OTHER): Payer: Medicare Other | Admitting: Family

## 2016-06-29 VITALS — BP 153/63 | HR 54 | Temp 98.6°F | Resp 18 | Ht 62.0 in | Wt 242.6 lb

## 2016-06-29 DIAGNOSIS — H6691 Otitis media, unspecified, right ear: Secondary | ICD-10-CM

## 2016-06-29 DIAGNOSIS — Z23 Encounter for immunization: Secondary | ICD-10-CM

## 2016-06-29 DIAGNOSIS — L304 Erythema intertrigo: Secondary | ICD-10-CM | POA: Diagnosis not present

## 2016-06-29 DIAGNOSIS — I6523 Occlusion and stenosis of bilateral carotid arteries: Secondary | ICD-10-CM | POA: Diagnosis not present

## 2016-06-29 MED ORDER — NYSTATIN 100000 UNIT/GM EX POWD: Freq: Two times a day (BID) | CUTANEOUS | 1 refills | Status: DC

## 2016-06-29 MED ORDER — NYSTATIN 100000 UNIT/GM EX CREA: 1.0000 "application " | TOPICAL_CREAM | Freq: Two times a day (BID) | CUTANEOUS | 0 refills | Status: DC

## 2016-06-29 NOTE — Progress Notes (Signed)
Subjective:    Patient ID: [name], female    DOB: 1946/05/31, 70 y.o.   MRN: [id number]  HPI  [name] is a 70 yr old female who presents today with c/o rash. Rash is located in the groin area. Rash has been present x 2 days. Has associated burning pain.    She reports that she is on amoxicillin for R sided ear infection.  Review of Systems    see HPI  Past Medical History:  Diagnosis Date  . Anxiety   . Arthritis   . CAD (coronary artery disease)   . Crohn's colitis ([place])    she reports ulcerative colitis beginning in her 20's, biopsies 2008 suggest Crohn's not UC  . Depression   . GERD (gastroesophageal reflux disease)   . Hyperlipidemia   . Hypertension   . Keloid of skin   . Obesity   . OSA (obstructive sleep apnea)   . Pancreatitis   . Paroxysmal atrial fibrillation ([facility])   . 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 Renal oncocytoma    s/p right nephrectomy     Social History   Social History  . Marital status: Married    Spouse name: N/A  . Number of children: 3  . Years of education: N/A   Occupational History  . Retired Retired    retired   Social History Main Topics  . Smoking status: Never Smoker  . Smokeless tobacco: Never Used  . Alcohol use No  . Drug use: No  . Sexual activity: Not Currently   Other Topics Concern  . 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 BREAST BIOPSY  1996   benign lesion   . Cataract surgery Left 11/13/14  . CHOLECYSTECTOMY  10/2006  . COLONOSCOPY W/ BIOPSIES    . CORONARY ANGIOPLASTY    . ESOPHAGOGASTRODUODENOSCOPY    . NEPHRECTOMY  10/2006   right secondary to oncocytoma   . RIGHT FEMORAL  PSEUDOANEURYSM & RIGHT GROIN HEMATOMA EVACUATION  02/2007  . RIGHT RENAL ARTERY REPAIR  1976  . TUBAL LIGATION       Family History  Problem Relation Age of Onset  . Colitis Father   . Hypertension Mother   . Coronary artery disease Mother   . Stroke Mother   . Stroke Brother   . Colon cancer Paternal Aunt   . Clotting disorder Mother   . Clotting disorder Brother   . Clotting disorder Sister   . Heart disease Mother   . Heart disease Father   . Asthma Daughter   . Allergies Mother   . Asthma Maternal Uncle   . Colon cancer Paternal Aunt   . Colon polyps Neg Hx   . Kidney disease Neg Hx     Allergies  Allergen Reactions  . Cefdinir     Pt cannot take; may interfere with AFib.  . Codeine     Heart beats fast   . Guaifenesin Er Nausea And Vomiting and Other (See Comments)    Headaches; can tolerate liquid.  . Latex     Breathing problems  . Mucilgen [Psyllium]   . Percocet [Oxycodone-Acetaminophen]     Itching & swelling in jaw    Current Outpatient Prescriptions on File  Prior to Visit  Medication Sig Dispense Refill  . acetaminophen (TYLENOL) 325 MG tablet Take 650 mg by mouth every 6 (six) hours as needed.    Marland Kitchen amLODipine (NORVASC) 10 MG tablet TAKE 1 TABLET (10 MG TOTAL) BY MOUTH DAILY. 30 tablet 5  . ammonium lactate (LAC-HYDRIN) 12 % lotion Apply 1 application topically daily as needed. Reported on 12/14/2015    . dofetilide (TIKOSYN) 125 MCG capsule Take 3 capsules (375 mcg total) by mouth 2 (two) times daily. 180 capsule 6  . escitalopram (LEXAPRO) 10 MG tablet TAKE 1 TABLET (10 MG TOTAL) BY MOUTH DAILY. 30 tablet 5  . hydrocortisone (ANUSOL-HC) 25 MG suppository Place 1 suppository (25 mg total) rectally at bedtime as needed for hemorrhoids or itching. 12 suppository 2  . InFLIXimab (REMICADE IV) Inject into the vein as directed.    . loperamide (IMODIUM A-D) 2 MG tablet Take 1 mg by mouth daily as needed. Dihrrhea    . losartan (COZAAR) 100 MG tablet TAKE 1 TABLET (100 MG TOTAL) BY MOUTH DAILY. 30 tablet 6  . magnesium gluconate (MAGONATE) 500 MG tablet Take 250 mg by  mouth daily. Reported on 03/31/2016    . metoprolol tartrate (LOPRESSOR) 25 MG tablet TAKE 1 TABLET (25 MG TOTAL) BY MOUTH 2 (TWO) TIMES DAILY. 180 tablet 2  . Multiple Vitamin (MULTIVITAMIN) capsule Take 1 capsule by mouth daily. 1 daily      . nitroGLYCERIN (NITROSTAT) 0.4 MG SL tablet Place 1 tablet (0.4 mg total) under the tongue every 5 (five) minutes as needed. 1 tab under tongue every 5 min for chest pain not to exceed 3 tabs in 15 min 25 tablet 6  . Probiotic Product ([place]) CAPS Take 1 capsule by mouth daily. 1 per day    . tiZANidine (ZANAFLEX) 4 MG tablet Take 1 tablet (4 mg total) by mouth 2 (two) times daily as needed for muscle spasms. 30 tablet 1  . warfarin (COUMADIN) 2.5 MG tablet TAKE 1-1.5 TABLETS BY MOUTH DAILY AS DIRECTED BY COUMADIN CLINIC 40 tablet 1   No current facility-administered medications on file prior to visit.     BP (!) 153/63 (BP Location: Right Arm, Patient Position: Sitting, Cuff Size: Large)   Pulse (!) 54   Temp 98.6 F (37 C) (Oral)   Resp 18   Ht 5\' 2"  (1.575 m)   Wt 242 lb 9.6 oz (110 kg)   SpO2 98%   BMI 44.37 kg/m    Objective:   Physical Exam  Constitutional: She appears well-developed and well-nourished.  HENT:  Right Ear: Tympanic membrane and ear canal normal.  Left Ear: Tympanic membrane and ear canal normal.  Cardiovascular: Normal rate, regular rhythm and normal heart sounds.   No murmur heard. Pulmonary/Chest: Effort normal and breath sounds normal. No respiratory distress. She has no wheezes.  Skin:  + erythematous rash noted beneath abdominal skin fold (R side greater than left)  Psychiatric: She has a normal mood and affect. Her behavior is normal. Judgment and thought content normal.          Assessment & Plan:  Intertrigo- rx with nystatin cream, then switch over to powder. Advised pt to keep area clean and dry and to dry gently with hair dryer after bathing.   R otitis media- improved, complete  antibiotics.   Flu shot today.

## 2016-06-29 NOTE — Patient Instructions (Signed)
Please apply nystatin ointment twice a day to abdominal skin fold. After redness has improved you can switch to nystatin powder once daily to prevent recurrence.  Keep skin clean and dry (use hair dryer after shower to ensure area is dry).  Call if redness worsens or if not improved in 1 week.

## 2016-06-29 NOTE — Progress Notes (Signed)
Pre visit review using our clinic review tool, if applicable. No additional management support is needed unless otherwise documented below in the visit note. 

## 2016-07-03 NOTE — Assessment & Plan Note (Signed)
hgba1c acceptable, minimize simple carbs. Increase exercise as tolerated.  

## 2016-07-03 NOTE — Assessment & Plan Note (Signed)
INR acceptable today

## 2016-07-03 NOTE — Assessment & Plan Note (Signed)
Encouraged DASH diet, decrease po intake and increase exercise as tolerated. Needs 7-8 hours of sleep nightly. Avoid trans fats, eat small, frequent meals every 4-5 hours with lean proteins, complex carbs and healthy fats. Minimize simple carbs 

## 2016-07-03 NOTE — Assessment & Plan Note (Signed)
Xray confirms OA in joint, referred to ortho for further consideration

## 2016-07-03 NOTE — Progress Notes (Signed)
Patient ID: [name], female   DOB: 20-Nov-1945, 70 y.o.   MRN: [id number]   Subjective:    Patient ID: [name], female    DOB: 04/19/1946, 70 y.o.   MRN: [id number]  Chief Complaint  Patient presents with  . Shoulder Pain    right shoulder pain. On going for about 3 weeks. Pt states the pain radiates down her arm and up her neck.    HPI Patient is in today for follow up. No recent illness or falls, no injury. She is noting several weeks of stiffness in right side of neck and pain in right shoulder. No radicular symptoms into arm. Denies CP/palp/SOB/HA/congestion/fevers/GI or GU c/o. Taking meds as prescribed  Past Medical History:  Diagnosis Date  . Anxiety   . Arthritis   . CAD (coronary artery disease)   . Crohn's colitis ([place])    she reports ulcerative colitis beginning in her 40's, biopsies 2008 suggest Crohn's not UC  . Depression   . GERD (gastroesophageal reflux disease)   . Hyperlipidemia   . Hypertension   . Keloid of skin   . Obesity   . OSA (obstructive sleep apnea)   . Pancreatitis   . Paroxysmal atrial fibrillation ([facility])   . 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 Renal oncocytoma    s/p right nephrectomy    Past Surgical History:  Procedure Laterality Date  . BREAST BIOPSY  1996   benign lesion   . Cataract surgery Left 11/13/14  . CHOLECYSTECTOMY  10/2006  . COLONOSCOPY W/ BIOPSIES    . CORONARY ANGIOPLASTY    . ESOPHAGOGASTRODUODENOSCOPY    . NEPHRECTOMY  10/2006   right secondary to oncocytoma   . RIGHT FEMORAL  PSEUDOANEURYSM & RIGHT GROIN HEMATOMA EVACUATION  02/2007  . RIGHT RENAL ARTERY REPAIR  1976  . TUBAL LIGATION      Family History  Problem Relation Age of Onset  . Colitis Father   . Hypertension Mother   . Coronary artery disease Mother   . Stroke Mother   . Stroke Brother   . Colon cancer Paternal Aunt   . Clotting disorder Mother   . Clotting disorder Brother   . Clotting disorder Sister   . Heart disease  Mother   . Heart disease Father   . Asthma Daughter   . Allergies Mother   . Asthma Maternal Uncle   . Colon cancer Paternal Aunt   . Colon polyps Neg Hx   . Kidney disease Neg Hx     Social History   Social History  . Marital status: Married    Spouse name: N/A  . Number of children: 3  . Years of education: N/A   Occupational History  . Retired Retired    retired   Social History Main Topics  . Smoking status: Never Smoker  . Smokeless tobacco: Never Used  . Alcohol use No  . Drug use: No  . Sexual activity: Not Currently   Other Topics Concern  . 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 acetaminophen (TYLENOL) 325 MG tablet Take 650 mg by mouth every 6 (six) hours as needed.    Marland Kitchen amLODipine (NORVASC) 10 MG tablet TAKE 1 TABLET (10  MG TOTAL) BY MOUTH DAILY. 30 tablet 5  . ammonium lactate (LAC-HYDRIN) 12 % lotion Apply 1 application topically daily as needed. Reported on 12/14/2015    . dofetilide (TIKOSYN) 125 MCG capsule Take 3 capsules (375 mcg total) by mouth 2 (two) times daily. 180 capsule 6  . hydrocortisone (ANUSOL-HC) 25 MG suppository Place 1 suppository (25 mg total) rectally at bedtime as needed for hemorrhoids or itching. 12 suppository 2  . InFLIXimab (REMICADE IV) Inject into the vein as directed.    . loperamide (IMODIUM A-D) 2 MG tablet Take 1 mg by mouth daily as needed. Dihrrhea    . losartan (COZAAR) 100 MG tablet TAKE 1 TABLET (100 MG TOTAL) BY MOUTH DAILY. 30 tablet 6  . magnesium gluconate (MAGONATE) 500 MG tablet Take 250 mg by mouth daily. Reported on 03/31/2016    . Multiple Vitamin (MULTIVITAMIN) capsule Take 1 capsule by mouth daily. 1 daily      . nitroGLYCERIN (NITROSTAT) 0.4 MG SL tablet Place 1 tablet (0.4 mg total) under the  tongue every 5 (five) minutes as needed. 1 tab under tongue every 5 min for chest pain not to exceed 3 tabs in 15 min 25 tablet 6  . Probiotic Product (Peralta) CAPS Take 1 capsule by mouth daily. 1 per day    . warfarin (COUMADIN) 2.5 MG tablet TAKE 1-1.5 TABLETS BY MOUTH DAILY AS DIRECTED BY COUMADIN CLINIC 40 tablet 1  . escitalopram (LEXAPRO) 10 MG tablet Take 1 tablet (10 mg total) by mouth daily. 30 tablet 5  . metoprolol tartrate (LOPRESSOR) 25 MG tablet Take 1 tablet (25 mg total) by mouth 2 (two) times daily. (Patient taking differently: Take 12.5 mg by mouth 2 (two) times daily. ) 180 tablet 3   No facility-administered medications prior to visit.     Allergies  Allergen Reactions  . Cefdinir     Pt cannot take; may interfere with AFib.  . Codeine     Heart beats fast   . Guaifenesin Er Nausea And Vomiting and Other (See Comments)    Headaches; can tolerate liquid.  . Latex     Breathing problems  . Mucilgen [Psyllium]   . Percocet [Oxycodone-Acetaminophen]     Itching & swelling in jaw    Review of Systems  Constitutional: Negative for fever and malaise/fatigue.  HENT: Negative for congestion.   Eyes: Negative for blurred vision.  Respiratory: Negative for shortness of breath.   Cardiovascular: Negative for chest pain, palpitations and leg swelling.  Gastrointestinal: Negative for abdominal pain, blood in stool and nausea.  Genitourinary: Negative for dysuria and frequency.  Musculoskeletal: Positive for joint pain and neck pain. Negative for falls.  Skin: Negative for rash.  Neurological: Negative for dizziness, loss of consciousness and headaches.  Endo/Heme/Allergies: Negative for environmental allergies.  Psychiatric/Behavioral: Negative for depression. The patient is not nervous/anxious.        Objective:    Physical Exam  Constitutional: She is oriented to person, place, and time. She appears well-developed and well-nourished. No distress.    HENT:  Head: Normocephalic and atraumatic.  Nose: Nose normal.  Eyes: Right eye exhibits no discharge. Left eye exhibits no discharge.  Neck: Normal range of motion. Neck supple.  Cardiovascular: Normal rate and regular rhythm.   No murmur heard. Pulmonary/Chest: Effort normal and breath sounds normal.  Abdominal: Soft. Bowel sounds are normal. There is no tenderness.  Musculoskeletal: She exhibits no edema.  Right SCM muscle noted to be tense  on palpation  Neurological: She is alert and oriented to person, place, and time.  Skin: Skin is warm and dry.  Psychiatric: She has a normal mood and affect.  Nursing note and vitals reviewed.   BP 126/78 (BP Location: Left Arm, Patient Position: Sitting, Cuff Size: Normal)   Pulse (!) 51   Temp 98.1 F (36.7 C) (Oral)   Wt 240 lb 9.6 oz (109.1 kg)   SpO2 98%   BMI 44.01 kg/m  Wt Readings from Last 3 Encounters:  06/29/16 242 lb 9.6 oz (110 kg)  06/20/16 240 lb 9.6 oz (109.1 kg)  05/06/16 242 lb (109.8 kg)     Lab Results  Component Value Date   WBC 9.0 06/20/2016   HGB 14.1 06/20/2016   HCT 42.3 06/20/2016   PLT 234.0 06/20/2016   GLUCOSE 96 06/20/2016   CHOL 172 06/20/2016   TRIG 136.0 06/20/2016   HDL 45.80 06/20/2016   LDLCALC 99 06/20/2016   ALT 19 06/20/2016   AST 23 06/20/2016   NA 142 06/20/2016   K 4.5 06/20/2016   CL 104 06/20/2016   CREATININE 1.02 06/20/2016   BUN 16 06/20/2016   CO2 29 06/20/2016   TSH 3.12 06/20/2016   INR 2.6 06/20/2016   HGBA1C 5.4 06/20/2016    Lab Results  Component Value Date   TSH 3.12 06/20/2016   Lab Results  Component Value Date   WBC 9.0 06/20/2016   HGB 14.1 06/20/2016   HCT 42.3 06/20/2016   MCV 93.8 06/20/2016   PLT 234.0 06/20/2016   Lab Results  Component Value Date   NA 142 06/20/2016   K 4.5 06/20/2016   CO2 29 06/20/2016   GLUCOSE 96 06/20/2016   BUN 16 06/20/2016   CREATININE 1.02 06/20/2016   BILITOT 0.6 06/20/2016   ALKPHOS 70 06/20/2016   AST 23  06/20/2016   ALT 19 06/20/2016   PROT 8.0 06/20/2016   ALBUMIN 4.0 06/20/2016   CALCIUM 9.4 06/20/2016   ANIONGAP 11 11/13/2015   GFR 56.81 (L) 06/20/2016   Lab Results  Component Value Date   CHOL 172 06/20/2016   Lab Results  Component Value Date   HDL 45.80 06/20/2016   Lab Results  Component Value Date   LDLCALC 99 06/20/2016   Lab Results  Component Value Date   TRIG 136.0 06/20/2016   Lab Results  Component Value Date   CHOLHDL 4 06/20/2016   Lab Results  Component Value Date   HGBA1C 5.4 06/20/2016       Assessment & Plan:   Problem List Items Addressed This Visit    Hyperlipidemia   Relevant Orders   Hemoglobin A1c (Completed)   Lipid panel (Completed)   Obesity    Encouraged DASH diet, decrease po intake and increase exercise as tolerated. Needs 7-8 hours of sleep nightly. Avoid trans fats, eat small, frequent meals every 4-5 hours with lean proteins, complex carbs and healthy fats. Minimize simple carbs      ATRIAL FIBRILLATION - Primary   Relevant Orders   POCT INR (Completed)   TSH (Completed)   CBC (Completed)   Comprehensive metabolic panel (Completed)   Osteoporosis   Relevant Orders   Vitamin D (25 hydroxy) (Completed)   Hyperglycemia    hgba1c acceptable, minimize simple carbs. Increase exercise as tolerated.       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 Balthazor start on tiZANidine. I am also having her  maintain her loperamide, multivitamin, [facility], ammonium lactate, InFLIXimab (REMICADE IV), hydrocortisone, dofetilide, magnesium gluconate, nitroGLYCERIN, amLODipine, losartan, warfarin, and acetaminophen.  Meds ordered this encounter  Medications  . tiZANidine (ZANAFLEX) 4 MG tablet    Sig: Take 1 tablet (4 mg total) by mouth 2 (two) times daily as needed for muscle spasms.    Dispense:  30 tablet    Refill:  1     Penni Homans, MD

## 2016-07-04 ENCOUNTER — Other Ambulatory Visit: Payer: Self-pay | Admitting: Family Medicine

## 2016-07-04 DIAGNOSIS — M545 Low back pain: Secondary | ICD-10-CM | POA: Diagnosis not present

## 2016-07-04 DIAGNOSIS — H209 Unspecified iridocyclitis: Secondary | ICD-10-CM | POA: Diagnosis not present

## 2016-07-04 DIAGNOSIS — M65322 Trigger finger, left index finger: Secondary | ICD-10-CM | POA: Diagnosis not present

## 2016-07-04 DIAGNOSIS — Q600 Renal agenesis, unilateral: Secondary | ICD-10-CM | POA: Diagnosis not present

## 2016-07-04 DIAGNOSIS — Z85828 Personal history of other malignant neoplasm of skin: Secondary | ICD-10-CM | POA: Diagnosis not present

## 2016-07-04 DIAGNOSIS — Z79899 Other long term (current) drug therapy: Secondary | ICD-10-CM | POA: Diagnosis not present

## 2016-07-04 DIAGNOSIS — K5090 Crohn's disease, unspecified, without complications: Secondary | ICD-10-CM | POA: Diagnosis not present

## 2016-07-04 DIAGNOSIS — M0589 Other rheumatoid arthritis with rheumatoid factor of multiple sites: Secondary | ICD-10-CM | POA: Diagnosis not present

## 2016-07-11 DIAGNOSIS — M0589 Other rheumatoid arthritis with rheumatoid factor of multiple sites: Secondary | ICD-10-CM | POA: Diagnosis not present

## 2016-07-17 ENCOUNTER — Other Ambulatory Visit: Payer: Self-pay | Admitting: Cardiology

## 2016-07-21 ENCOUNTER — Ambulatory Visit (INDEPENDENT_AMBULATORY_CARE_PROVIDER_SITE_OTHER): Payer: Medicare Other | Admitting: *Deleted

## 2016-07-21 ENCOUNTER — Telehealth: Payer: Self-pay | Admitting: *Deleted

## 2016-07-21 DIAGNOSIS — K649 Unspecified hemorrhoids: Secondary | ICD-10-CM

## 2016-07-21 DIAGNOSIS — I4891 Unspecified atrial fibrillation: Secondary | ICD-10-CM | POA: Diagnosis not present

## 2016-07-21 DIAGNOSIS — K625 Hemorrhage of anus and rectum: Secondary | ICD-10-CM

## 2016-07-21 DIAGNOSIS — Z5181 Encounter for therapeutic drug level monitoring: Secondary | ICD-10-CM | POA: Diagnosis not present

## 2016-07-21 LAB — POCT INR: INR: 1.7

## 2016-07-21 MED ORDER — HYDROCORTISONE ACETATE 25 MG RE SUPP
25.0000 mg | Freq: Every evening | RECTAL | 2 refills | Status: DC | PRN
Start: 2016-07-21 — End: 2017-11-06

## 2016-07-21 NOTE — Telephone Encounter (Signed)
Pharmacy: [id number] - [place], [place] - [address]  Pt requesting refill of hydrocortisone (ANUSOL-HC) 25 MG suppository. Last filled 08/28/14. Pt states she uses infrequently for hemorrhoids/itching as directed. Please advise.

## 2016-07-21 NOTE — Progress Notes (Signed)
Pre visit review using our clinic review tool, if applicable. No additional management support is needed unless otherwise documented below in the visit note.  Next appointment: 08/11/16   Dorrene German, RN

## 2016-07-21 NOTE — Telephone Encounter (Signed)
Ok to refill the suppositories as requested, same sig, same number

## 2016-07-21 NOTE — Telephone Encounter (Signed)
Sent in suppositories

## 2016-07-21 NOTE — Patient Instructions (Signed)
Per Dr. Charlett Blake: Continue taking coumadin 2.5 mg (1 tablet) daily, except take 1.25 mg (0.5 tablet) on Tuesdays and Saturdays. Recheck INR in 3-4 weeks.

## 2016-07-29 ENCOUNTER — Other Ambulatory Visit: Payer: Self-pay | Admitting: Cardiology

## 2016-07-29 ENCOUNTER — Telehealth: Payer: Self-pay | Admitting: Internal Medicine

## 2016-07-29 MED ORDER — LOSARTAN POTASSIUM 100 MG PO TABS: ORAL_TABLET | ORAL | 1 refills | Status: DC

## 2016-07-29 MED ORDER — BUDESONIDE 9 MG PO TB24: 9.0000 mg | ORAL_TABLET | Freq: Every day | ORAL | 1 refills | Status: DC

## 2016-07-29 NOTE — Telephone Encounter (Signed)
Pt aware and script sent to pharmacy. 

## 2016-07-29 NOTE — Telephone Encounter (Signed)
Uceris 9 mg daily # 30 1 RF If that does not help call back  May need to use loperamide for the wedding

## 2016-07-29 NOTE — Telephone Encounter (Signed)
Pt states she is having a crohn's flare. Reports she is having diarrhea and stomach pain. States Dr. Carlean Purl gave her Uceris for a flare in the past and it really helped. Pts daughter is getting married tomorrow and she is requesting a Fish farm manager for [employer]. Please advise.

## 2016-08-01 ENCOUNTER — Telehealth: Payer: Self-pay

## 2016-08-01 MED ORDER — PREDNISONE 10 MG PO TABS: ORAL_TABLET | ORAL | 0 refills | Status: DC

## 2016-08-01 NOTE — Telephone Encounter (Signed)
  Pharmacy sent request to change his budesonide 9mg  qd rx due to cost of $ 1100.00 for 30 tablets.  Please advise Sir, thank you.

## 2016-08-01 NOTE — Telephone Encounter (Signed)
Prednisone 10 mg tabs take   4 daily x 3 d  3 daily x 3 d 2 daily x 6 d 1 daily x 6 d 1/2 daily x 6 d

## 2016-08-01 NOTE — Telephone Encounter (Signed)
Prednisone rx called in to pharmacy, budesonide too $$$.  I called and informed patient of the change.

## 2016-08-02 ENCOUNTER — Other Ambulatory Visit: Payer: Self-pay

## 2016-08-02 MED ORDER — DOFETILIDE 125 MCG PO CAPS: 375.0000 ug | ORAL_CAPSULE | Freq: Two times a day (BID) | ORAL | 0 refills | Status: DC

## 2016-08-05 ENCOUNTER — Other Ambulatory Visit: Payer: Self-pay | Admitting: Family Medicine

## 2016-08-05 MED ORDER — WARFARIN SODIUM 2.5 MG PO TABS: 2.5000 mg | ORAL_TABLET | Freq: Once | ORAL | 0 refills | Status: DC

## 2016-08-11 ENCOUNTER — Ambulatory Visit (INDEPENDENT_AMBULATORY_CARE_PROVIDER_SITE_OTHER): Payer: Medicare Other | Admitting: Behavioral Health

## 2016-08-11 DIAGNOSIS — I4891 Unspecified atrial fibrillation: Secondary | ICD-10-CM

## 2016-08-11 DIAGNOSIS — Z5181 Encounter for therapeutic drug level monitoring: Secondary | ICD-10-CM

## 2016-08-11 LAB — POCT INR: INR: 1.8

## 2016-08-11 NOTE — Progress Notes (Signed)
Pre visit review using our clinic review tool, if applicable. No additional management support is needed unless otherwise documented below in the visit note.  Patient presents in office for INR check. Reviewed medication and regimen with the patient. She reported an upcoming dental appointment on 08/15/16 for a tooth extraction. No other positive patient findings were addressed. Today's INR was 1.8.   Per Dr. Charlett Blake: Continue taking coumadin 2.5 mg (1 tablet) daily, except take 1.25 mg (0.5 tablet) on Saturdays. Recheck INR in 7-10 days.  Informed patient of the provider's instructions. She verbalized understanding and did not have any further concerns.  Next appointment scheduled for 08/18/16 at 3:45 PM.

## 2016-08-11 NOTE — Patient Instructions (Signed)
Per Dr. Charlett Blake: Continue taking coumadin 2.5 mg (1 tablet) daily, except take 1.25 mg (0.5 tablet) on Saturdays. Recheck INR in 7-10 days.

## 2016-08-11 NOTE — Progress Notes (Signed)
RN INR note reviewed. Agree with documention and plan. 

## 2016-08-12 ENCOUNTER — Other Ambulatory Visit: Payer: Self-pay | Admitting: Family Medicine

## 2016-08-12 DIAGNOSIS — Z1231 Encounter for screening mammogram for malignant neoplasm of breast: Secondary | ICD-10-CM

## 2016-08-18 ENCOUNTER — Ambulatory Visit: Payer: Medicare Other

## 2016-08-18 ENCOUNTER — Ambulatory Visit (INDEPENDENT_AMBULATORY_CARE_PROVIDER_SITE_OTHER): Payer: Medicare Other | Admitting: Behavioral Health

## 2016-08-18 DIAGNOSIS — I4891 Unspecified atrial fibrillation: Secondary | ICD-10-CM | POA: Diagnosis not present

## 2016-08-18 DIAGNOSIS — Z5181 Encounter for therapeutic drug level monitoring: Secondary | ICD-10-CM

## 2016-08-18 LAB — POCT INR: INR: 2.4

## 2016-08-18 NOTE — Progress Notes (Signed)
Pre visit review using our clinic review tool, if applicable. No additional management support is needed unless otherwise documented below in the visit note.  Pt presented to clinic for INR check. Result was 2.4. Dr.Blyth notified. Reviewed medication and regimen. Pt denied any positive finding. Pt is to cont current Coumadin regimen and return in four weeks. Next appt 11/28 @1045 .

## 2016-08-18 NOTE — Patient Instructions (Addendum)
Per Dr. Charlett Blake: Continue taking coumadin 2.5 mg (1 tablet) daily, except take 1.25 mg (0.5 tablet) on Saturdays. Recheck INR in 4 weeks.

## 2016-08-22 ENCOUNTER — Encounter: Payer: Self-pay | Admitting: Family Medicine

## 2016-08-22 ENCOUNTER — Ambulatory Visit (INDEPENDENT_AMBULATORY_CARE_PROVIDER_SITE_OTHER): Payer: Medicare Other | Admitting: Family Medicine

## 2016-08-22 DIAGNOSIS — R682 Dry mouth, unspecified: Secondary | ICD-10-CM | POA: Diagnosis not present

## 2016-08-22 DIAGNOSIS — B370 Candidal stomatitis: Secondary | ICD-10-CM | POA: Diagnosis not present

## 2016-08-22 DIAGNOSIS — I6523 Occlusion and stenosis of bilateral carotid arteries: Secondary | ICD-10-CM | POA: Diagnosis not present

## 2016-08-22 NOTE — Patient Instructions (Signed)
Thrush, Adult  Thrush, also called oral candidiasis, is a fungal infection that develops in the mouth and throat and on the tongue. It causes white patches to form on the mouth and tongue. Thrush is most common in older adults, but it can occur at any age.   Many cases of thrush are mild, but this infection can also be more serious. Thrush can be a recurring problem for people who have chronic illnesses or who take medicines that limit the body's ability to fight infection. Because these people have difficulty fighting infections, the fungus that causes thrush can spread throughout the body. This can cause life-threatening blood or organ infections.  CAUSES   Thrush is usually caused by a yeast called Candida albicans. This fungus is normally present in small amounts in the mouth and on other mucous membranes. It usually causes no harm. However, when conditions are present that allow the fungus to grow uncontrolled, it invades surrounding tissues and becomes an infection. Less often, other Candida species can also lead to thrush.   RISK FACTORS  Thrush is more likely to develop in the following people:  · People with an impaired ability to fight infection (weakened immune system).    · Older adults.    · People with HIV.    · People with diabetes.    · People with dry mouth (xerostomia).    · Pregnant women.    · People with poor dental care, especially those who have false teeth.    · People who use antibiotic medicines.    SIGNS AND SYMPTOMS   Thrush can be a mild infection that causes no symptoms. If symptoms develop, they may include:   · A burning feeling in the mouth and throat. This can occur at the start of a thrush infection.    · White patches that adhere to the mouth and tongue. The tissue around the patches may be red, raw, and painful. If rubbed (during tooth brushing, for example), the patches and the tissue of the mouth may bleed easily.    · A bad taste in the mouth or difficulty tasting foods.     · Cottony feeling in the mouth.    · Pain during eating and swallowing.  DIAGNOSIS   Your health care provider can usually diagnose thrush by looking in your mouth and asking you questions about your health.   TREATMENT   Medicines that help prevent the growth of fungi (antifungals) are the standard treatment for thrush. These medicines are either applied directly to the affected area (topical) or swallowed (oral). The treatment will depend on the severity of the condition.   Mild Thrush  Mild cases of thrush may clear up with the use of an antifungal mouth rinse or lozenges. Treatment usually lasts about 14 days.   Moderate to Severe Thrush  · More severe thrush infections that have spread to the esophagus are treated with an oral antifungal medicine. A topical antifungal medicine may also be used.    · For some severe infections, a treatment period longer than 14 days may be needed.    · Oral antifungal medicines are almost never used during pregnancy because the fetus may be harmed. However, if a pregnant woman has a rare, severe thrush infection that has spread to her blood, oral antifungal medicines may be used. In this case, the risk of harm to the mother and fetus from the severe thrush infection may be greater than the risk posed by the use of antifungal medicines.    Persistent or Recurrent Thrush  For cases of   thrush that do not go away or keep coming back, treatment may involve the following:   · Treatment may be needed twice as long as the symptoms last.    · Treatment will include both oral and topical antifungal medicines.    · People with weakened immune systems can take an antifungal medicine on a continuous basis to prevent thrush infections.    It is important to treat conditions that make you more likely to get thrush, such as diabetes or HIV.   HOME CARE INSTRUCTIONS   · Only take over-the-counter or prescription medicine as directed by your health care provider. Talk to your health care  provider about an over-the-counter medicine called gentian violet, which kills bacteria and fungi.    · Eat plain, unflavored yogurt as directed by your health care provider. Check the label to make sure the yogurt contains live cultures. This yogurt can help healthy bacteria grow in the mouth that can stop the growth of the fungus that causes thrush.    · Try these measures to help reduce the discomfort of thrush:      Drink cold liquids such as water or iced tea.      Try flavored ice treats or frozen juices.      Eat foods that are easy to swallow, such as gelatin, ice cream, or custard.      If the patches in your mouth are painful, try drinking from a straw.    · Rinse your mouth several times a day with a warm saltwater rinse. You can make the saltwater mixture with 1 tsp (6 g) of salt in 8 fl oz (0.2 L) of warm water.    · If you wear dentures, remove the dentures before going to bed, brush them vigorously, and soak them in a cleaning solution as directed by your health care provider.    · Women who are breastfeeding should clean their nipples with an antifungal medicine as directed by their health care provider. Dry the nipples after breastfeeding. Applying lanolin-containing body lotion may help relieve nipple soreness.    SEEK MEDICAL CARE IF:  · Your symptoms are getting worse or are not improving within 7 days of starting treatment.    · You have symptoms of spreading infection, such as white patches on the skin outside of the mouth.    · You are nursing and you have redness, burning, or pain in the nipples that is not relieved with treatment.    MAKE SURE YOU:  · Understand these instructions.  · Will watch your condition.  · Will get help right away if you are not doing well or get worse.     This information is not intended to replace advice given to you by your health care provider. Make sure you discuss any questions you have with your health care provider.     Document Released: 07/05/2004 Document  Revised: 10/31/2014 Document Reviewed: 05/13/2013  [employer]vier Interactive Patient Education ©2016 [employer]

## 2016-08-22 NOTE — Progress Notes (Signed)
Patient ID: UNKNOWN [name], female    DOB: 1946-06-11  Age: 70 y.o. MRN: [id number]    Subjective:  Subjective  HPI [name] presents for possible thrush--- her rheum gave her nystatin swish and spit --- she tried it one time and had diarrhea and was wondering if it was the nystatin or the food she ate that night.   She c/o dry nose-- and is using saline-- no other complaints  Review of Systems  Constitutional: Negative for appetite change, diaphoresis, fatigue and unexpected weight change.  Eyes: Negative for pain, redness and visual disturbance.  Respiratory: Negative for cough, chest tightness, shortness of breath and wheezing.   Cardiovascular: Negative for chest pain, palpitations and leg swelling.  Endocrine: Negative for cold intolerance, heat intolerance, polydipsia, polyphagia and polyuria.  Genitourinary: Negative for difficulty urinating, dysuria and frequency.  Neurological: Negative for dizziness, light-headedness, numbness and headaches.    History Past Medical History:  Diagnosis Date  . Anxiety   . Arthritis   . CAD (coronary artery disease)   . Crohn's colitis ([place])    she reports ulcerative colitis beginning in her 37's, biopsies 2008 suggest Crohn's not UC  . Depression   . GERD (gastroesophageal reflux disease)   . Hyperlipidemia   . Hypertension   . Keloid of skin   . Obesity   . OSA (obstructive sleep apnea)   . Pancreatitis   . Paroxysmal atrial fibrillation ([facility])   . 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 Renal oncocytoma    s/p right nephrectomy    She has a past surgical history that includes Nephrectomy (10/2006); Breast biopsy (1996); Cholecystectomy (10/2006); Tubal ligation; RIGHT RENAL ARTERY REPAIR (1976); RIGHT FEMORAL  PSEUDOANEURYSM & RIGHT GROIN HEMATOMA EVACUATION (02/2007); Colonoscopy w/ biopsies; Esophagogastroduodenoscopy; Coronary angioplasty; and Cataract surgery (Left, 11/13/14).   Her family history includes  Allergies in her mother; Asthma in her daughter and maternal uncle; Clotting disorder in her brother, mother, and sister; Colitis in her father; Colon cancer in her paternal aunt and paternal aunt; Coronary artery disease in her mother; Heart disease in her father and mother; Hypertension in her mother; Stroke in her brother and mother.She reports that she has never smoked. She has never used smokeless tobacco. She reports that she does not drink alcohol or use drugs.  Current Outpatient Prescriptions on File Prior to Visit  Medication Sig Dispense Refill  . acetaminophen (TYLENOL) 325 MG tablet Take 650 mg by mouth every 6 (six) hours as needed.    Marland Kitchen amLODipine (NORVASC) 10 MG tablet TAKE 1 TABLET (10 MG TOTAL) BY MOUTH DAILY. 30 tablet 3  . ammonium lactate (LAC-HYDRIN) 12 % lotion Apply 1 application topically daily as needed. Reported on 12/14/2015    . dofetilide (TIKOSYN) 125 MCG capsule Take 3 capsules (375 mcg total) by mouth 2 (two) times daily. 180 capsule 0  . escitalopram (LEXAPRO) 10 MG tablet TAKE 1 TABLET (10 MG TOTAL) BY MOUTH DAILY. 30 tablet 5  . hydrocortisone (ANUSOL-HC) 25 MG suppository Place 1 suppository (25 mg total) rectally at bedtime as needed for hemorrhoids or itching. 12 suppository 2  . InFLIXimab (REMICADE IV) Inject into the vein as directed.    . loperamide (IMODIUM A-D) 2 MG tablet Take 1 mg by mouth daily as needed. Dihrrhea    . losartan (COZAAR) 100 MG tablet TAKE 1 TABLET (100 MG TOTAL) BY MOUTH DAILY. 90 tablet 1  . metoprolol tartrate (LOPRESSOR) 25 MG tablet TAKE 1 TABLET (  25 MG TOTAL) BY MOUTH 2 (TWO) TIMES DAILY. 180 tablet 2  . Multiple Vitamin (MULTIVITAMIN) capsule Take 1 capsule by mouth daily. 1 daily      . nitroGLYCERIN (NITROSTAT) 0.4 MG SL tablet Place 1 tablet (0.4 mg total) under the tongue every 5 (five) minutes as needed. 1 tab under tongue every 5 min for chest pain not to exceed 3 tabs in 15 min 25 tablet 6  . nystatin (MYCOSTATIN/NYSTOP)  powder Apply topically 2 (two) times daily. To affected area 45 g 1  . nystatin cream (MYCOSTATIN) Apply 1 application topically 2 (two) times daily. To affected area 30 g 0  . predniSONE (DELTASONE) 10 MG tablet 4 tabs qd x 3 days, 3 tabs qd x 3 days, 2 tabs qd x 6 days, 1 tab qd  x 6 days, 1/2 tab qd x 6 days  Called in to pharmacy. 100 tablet 0  . Probiotic Product ([place]) CAPS Take 1 capsule by mouth daily. 1 per day    . tiZANidine (ZANAFLEX) 4 MG tablet Take 1 tablet (4 mg total) by mouth 2 (two) times daily as needed for muscle spasms. 30 tablet 1  . amoxicillin (AMOXIL) 500 MG capsule     . magnesium gluconate (MAGONATE) 500 MG tablet Take 250 mg by mouth daily. Reported on 03/31/2016    . warfarin (COUMADIN) 2.5 MG tablet Take 1 tablet (2.5 mg total) by mouth one time only at 6 PM. 120 tablet 0   No current facility-administered medications on file prior to visit.      Objective:  Objective  Physical Exam  Constitutional: She is oriented to person, place, and time. She appears well-developed and well-nourished.  HENT:  Head: Normocephalic and atraumatic.  Mouth/Throat:    Eyes: Conjunctivae and EOM are normal.  Neck: Normal range of motion. Neck supple. No JVD present. Carotid bruit is not present. No thyromegaly present.  Cardiovascular: Normal rate, regular rhythm and normal heart sounds.   No murmur heard. Pulmonary/Chest: Effort normal and breath sounds normal. No respiratory distress. She has no wheezes. She has no rales. She exhibits no tenderness.  Musculoskeletal: She exhibits no edema.  Neurological: She is alert and oriented to person, place, and time.  Psychiatric: She has a normal mood and affect.  Nursing note and vitals reviewed.  BP 130/82 (BP Location: Left Arm, Patient Position: Sitting, Cuff Size: Large)   Pulse (!) 51   Temp 98.1 F (36.7 C) (Oral)   Resp 16   Ht 5\' 2"  (1.575 m)   Wt 237 lb 9.6 oz (107.8 kg)   SpO2 95%   BMI 43.46  kg/m  Wt Readings from Last 3 Encounters:  08/22/16 237 lb 9.6 oz (107.8 kg)  06/29/16 242 lb 9.6 oz (110 kg)  06/20/16 240 lb 9.6 oz (109.1 kg)     Lab Results  Component Value Date   WBC 9.0 06/20/2016   HGB 14.1 06/20/2016   HCT 42.3 06/20/2016   PLT 234.0 06/20/2016   GLUCOSE 96 06/20/2016   CHOL 172 06/20/2016   TRIG 136.0 06/20/2016   HDL 45.80 06/20/2016   LDLCALC 99 06/20/2016   ALT 19 06/20/2016   AST 23 06/20/2016   NA 142 06/20/2016   K 4.5 06/20/2016   CL 104 06/20/2016   CREATININE 1.02 06/20/2016   BUN 16 06/20/2016   CO2 29 06/20/2016   TSH 3.12 06/20/2016   INR 2.4 08/18/2016   HGBA1C 5.4 06/20/2016  Dg Cervical Spine Complete  Result Date: 06/20/2016 CLINICAL DATA:  Right-sided neck and shoulder pain for 3 weeks without known injury. EXAM: CERVICAL SPINE - COMPLETE 4+ VIEW COMPARISON:  None. FINDINGS: There is no evidence of cervical spine fracture or prevertebral soft tissue swelling. Alignment is normal. No other significant bone abnormalities are identified. IMPRESSION: Negative cervical spine radiographs. Electronically Signed   By: Marijo Conception, M.D.   On: 06/20/2016 11:41   Dg Shoulder Right  Result Date: 06/20/2016 CLINICAL DATA:  Patient states that she has had right sided neck and shoulder pains x 3 weeks, denies any known injury, states that she feels like she is having spasms in her neck, no other complaints EXAM: RIGHT SHOULDER - 2+ VIEW COMPARISON:  None. FINDINGS: No fracture.  No bone lesion. Mild AC joint osteoarthritis. Glenohumeral joint is normally spaced and aligned. Bones are demineralized. Soft tissues are unremarkable. IMPRESSION: No fracture or dislocation.  Mild AC joint osteoarthritis. Electronically Signed   By: Lajean Manes M.D.   On: 06/20/2016 11:40     Assessment & Plan:  Plan  I am having [name] maintain her loperamide, multivitamin, [facility], ammonium lactate, InFLIXimab (REMICADE IV), magnesium  gluconate, nitroGLYCERIN, acetaminophen, tiZANidine, escitalopram, metoprolol tartrate, amoxicillin, nystatin cream, nystatin, amLODipine, hydrocortisone, losartan, predniSONE, dofetilide, and warfarin.  No orders of the defined types were placed in this encounter.   Problem List Items Addressed This Visit      Unprioritized   Dry mouth    Biotene       Thrush, oral    con't nystatin swish and spit Call if no improvement or if she has diarrhea and we will try something different        Other Visit Diagnoses   None.     Follow-up: No Follow-up on file.  Ann Held, DO

## 2016-08-22 NOTE — Assessment & Plan Note (Addendum)
con't nystatin swish and spit Call if no improvement or if she has diarrhea and we will try something different

## 2016-08-22 NOTE — Progress Notes (Signed)
Pre visit review using our clinic review tool, if applicable. No additional management support is needed unless otherwise documented below in the visit note. 

## 2016-08-22 NOTE — Assessment & Plan Note (Signed)
Biotene

## 2016-08-25 ENCOUNTER — Ambulatory Visit (HOSPITAL_BASED_OUTPATIENT_CLINIC_OR_DEPARTMENT_OTHER)
Admission: RE | Admit: 2016-08-25 | Discharge: 2016-08-25 | Disposition: A | Discharge status: Home / Self Care | Payer: Medicare Other | Source: Ambulatory Visit | Attending: Family Medicine | Admitting: Family Medicine

## 2016-08-25 DIAGNOSIS — Z1231 Encounter for screening mammogram for malignant neoplasm of breast: Secondary | ICD-10-CM | POA: Insufficient documentation

## 2016-08-26 ENCOUNTER — Encounter: Payer: Self-pay | Admitting: Physician Assistant

## 2016-08-26 ENCOUNTER — Telehealth: Payer: Self-pay | Admitting: *Deleted

## 2016-08-26 ENCOUNTER — Ambulatory Visit (INDEPENDENT_AMBULATORY_CARE_PROVIDER_SITE_OTHER): Payer: Medicare Other | Admitting: Physician Assistant

## 2016-08-26 VITALS — BP 122/78 | HR 56 | Ht 63.0 in | Wt 235.0 lb

## 2016-08-26 DIAGNOSIS — D126 Benign neoplasm of colon, unspecified: Secondary | ICD-10-CM | POA: Diagnosis not present

## 2016-08-26 DIAGNOSIS — B370 Candidal stomatitis: Secondary | ICD-10-CM

## 2016-08-26 DIAGNOSIS — R197 Diarrhea, unspecified: Secondary | ICD-10-CM | POA: Diagnosis not present

## 2016-08-26 DIAGNOSIS — K5010 Crohn's disease of large intestine without complications: Secondary | ICD-10-CM

## 2016-08-26 DIAGNOSIS — I6523 Occlusion and stenosis of bilateral carotid arteries: Secondary | ICD-10-CM | POA: Diagnosis not present

## 2016-08-26 DIAGNOSIS — R152 Fecal urgency: Secondary | ICD-10-CM | POA: Diagnosis not present

## 2016-08-26 MED ORDER — DICYCLOMINE HCL 10 MG PO CAPS: 10.0000 mg | ORAL_CAPSULE | Freq: Three times a day (TID) | ORAL | 4 refills | Status: DC

## 2016-08-26 NOTE — Progress Notes (Signed)
Subjective:    Patient ID: [name], female    DOB: 11-Jan-1946, 70 y.o.   MRN: [id number]  HPI [name] is a pleasant 70 year old white female known to Dr. Carlean Purl. She has history of Crohn's colitis and adenomatous colon polyps and comes in today to discuss follow-up colonoscopy and management of her IBD. [place] Patient also has history of rheumatoid arthritis for which she has been on Remicade over the past 2 years, hypertension, coronary artery disease status post prior MI, cerebrovascular disease, sleep apnea, and atrial fibrillation. She is followed by [name] and is maintained on Coumadin. Last colonoscopy November 2013 showed 3 diminutive sessile polyps in severe left colon diverticulosis as well as internal hemorrhoids. The colonic mucosa appeared normal and biopsies showed no active inflammation. Path on the polyps were consistent with tubular adenomas. Initially she was slated for 2 year interval follow-up. She says she has had some problem with diarrhea over the past month and a half. She had called here and was to start on Uceris ,which was not covered by her insurance, she was then given a prescription for prednisone but says she didn't start it because she worries about interference with Coumadin.  She now believes that her diarrhea was stress related as she had a lot of family issues going on at the time. That diarrhea has subsided but she continues to have occasional loose stools and urgency. She says she goes through periods of time with loose stools and urgency and then other times has fairly normal bowel movements. She has not been noticing any melena or hematochezia. She mentions that she's currently on Mycostatin oral suspension for oral thrush had a lot of irritation of her lips and mouth. She just started the medication Monday and seems to be helping.  Review of Systems Pertinent positive and negative review of systems were noted in the above HPI section.  All other  review of systems was otherwise negative.  Outpatient Encounter Prescriptions as of 08/26/2016  Medication Sig  . acetaminophen (TYLENOL) 325 MG tablet Take 650 mg by mouth every 6 (six) hours as needed.  Marland Kitchen amLODipine (NORVASC) 10 MG tablet TAKE 1 TABLET (10 MG TOTAL) BY MOUTH DAILY.  Marland Kitchen ammonium lactate (LAC-HYDRIN) 12 % lotion Apply 1 application topically daily as needed. Reported on 12/14/2015  . dofetilide (TIKOSYN) 125 MCG capsule Take 3 capsules (375 mcg total) by mouth 2 (two) times daily.  Marland Kitchen escitalopram (LEXAPRO) 10 MG tablet TAKE 1 TABLET (10 MG TOTAL) BY MOUTH DAILY.  . hydrocortisone (ANUSOL-HC) 25 MG suppository Place 1 suppository (25 mg total) rectally at bedtime as needed for hemorrhoids or itching.  . InFLIXimab (REMICADE IV) Inject into the vein as directed.  . loperamide (IMODIUM A-D) 2 MG tablet Take 1 mg by mouth daily as needed. Dihrrhea  . losartan (COZAAR) 100 MG tablet TAKE 1 TABLET (100 MG TOTAL) BY MOUTH DAILY.  . metoprolol tartrate (LOPRESSOR) 25 MG tablet TAKE 1 TABLET (25 MG TOTAL) BY MOUTH 2 (TWO) TIMES DAILY.  . Multiple Vitamin (MULTIVITAMIN) capsule Take 1 capsule by mouth daily. 1 daily    . nitroGLYCERIN (NITROSTAT) 0.4 MG SL tablet Place 1 tablet (0.4 mg total) under the tongue every 5 (five) minutes as needed. 1 tab under tongue every 5 min for chest pain not to exceed 3 tabs in 15 min  . nystatin (MYCOSTATIN/NYSTOP) powder Apply topically 2 (two) times daily. To affected area  . nystatin cream (MYCOSTATIN) Apply 1 application topically 2 (two)  times daily. To affected area  . Probiotic Product ([place]) CAPS Take 1 capsule by mouth daily. 1 per day  . dicyclomine (BENTYL) 10 MG capsule Take 1 capsule (10 mg total) by mouth 3 (three) times daily before meals.  . warfarin (COUMADIN) 2.5 MG tablet Take 1 tablet (2.5 mg total) by mouth one time only at 6 PM.  . [DISCONTINUED] amoxicillin (AMOXIL) 500 MG capsule   . [DISCONTINUED] magnesium  gluconate (MAGONATE) 500 MG tablet Take 250 mg by mouth daily. Reported on 03/31/2016  . [DISCONTINUED] predniSONE (DELTASONE) 10 MG tablet 4 tabs qd x 3 days, 3 tabs qd x 3 days, 2 tabs qd x 6 days, 1 tab qd  x 6 days, 1/2 tab qd x 6 days  Called in to pharmacy.  . [DISCONTINUED] tiZANidine (ZANAFLEX) 4 MG tablet Take 1 tablet (4 mg total) by mouth 2 (two) times daily as needed for muscle spasms.   No facility-administered encounter medications on file as of 08/26/2016.    Allergies  Allergen Reactions  . Cefdinir     Pt cannot take; may interfere with AFib.  . Codeine     Heart beats fast   . Guaifenesin Er Nausea And Vomiting and Other (See Comments)    Headaches; can tolerate liquid.  . Latex     Breathing problems  . Mucilgen [Psyllium]   . Percocet [Oxycodone-Acetaminophen]     Itching & swelling in jaw   Patient Active Problem List   Diagnosis Date Noted  . Thrush, oral 08/22/2016  . Dry mouth 08/22/2016  . Cerebrovascular disease 07/30/2014  . Sinusitis 06/30/2014  . Encounter for therapeutic drug monitoring 06/09/2014  . Pain in joint of right shoulder 04/21/2014  . Other acne 04/21/2014  . Intertrigo 11/12/2013  . Keloid of skin   . Bilateral leg edema 09/12/2012  . IBS (irritable bowel syndrome) 08/21/2012  . Medicare annual wellness visit, subsequent 08/17/2012  . Radicular low back pain 06/28/2012  . Basal cell carcinoma 02/07/2012  . Maxillary sinus cyst 02/07/2012  . Statin intolerance 02/28/2011  . Crohn's colitis ([place])   . 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  . CAROTID BRUIT 12/10/2009  . Hyperglycemia 07/02/2009  . HEADACHE 02/24/2009  . ATRIAL FIBRILLATION 01/15/2009  . Obesity 01/03/2008  . OSA (obstructive sleep apnea) 01/03/2008  . Osteoporosis 11/05/2007  . Hyperlipidemia 09/06/2007  . DEPRESSION/ANXIETY 09/06/2007  . HEARING LOSS 09/06/2007  . Essential hypertension 09/06/2007  . MYOCARDIAL INFARCTION, HX OF 09/06/2007    . Coronary atherosclerosis 09/06/2007  . ALLERGIC RHINITIS 09/06/2007  . GERD 09/06/2007  . COLONIC POLYPS, ADENOMATOUS, HX OF 07/04/2007   Social History   Social History  . Marital status: Married    Spouse name: N/A  . Number of children: 3  . Years of education: N/A   Occupational History  . Retired Retired    retired   Social History Main Topics  . Smoking status: Never Smoker  . Smokeless tobacco: Never Used  . Alcohol use No  . Drug use: No  . Sexual activity: Not Currently   Other Topics Concern  . 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 Call's family history includes Allergies in her mother; Asthma in her daughter and maternal uncle; Clotting disorder in her brother, mother, and sister;  Colitis in her father; Colon cancer in her paternal aunt; Coronary artery disease in her mother; Heart disease in her father and mother; Hypertension in her mother; Stroke in her brother and mother.      Objective:    Vitals:   08/26/16 1055  BP: 122/78  Pulse: (!) 56    Physical Exam  well developed older white female in no acute distress, pleasant blood pressure 122/78 pulse 56, Weight 235, BMI 41.6. HEENT ;nontraumatic normocephalic EOMI PERRLA sclera anicteric, Cardiovascular; regular rate and rhythm with S1-S2, Pulmonary ;bilaterally, Abdomen ;obese soft nontender nondistended bowel sounds are active there is no palpable mass or hepatosplenomegaly, She does have some abdominal wall varicosities bilaterally in her lower abdomen, Rectal; exam not done, Ext;no clubbing cyanosis or edema skin warm and dry, Neuropsych; mood and affect appropriate       Assessment & Plan:   #55 70 year old white female with history of Crohn's colitis in remission time of last colonoscopy and history of adenomatous colon polyps due for follow-up  colonoscopy #2 chronic anticoagulation-on Coumadin  #3 atrial fibrillation Extent #4 coronary artery disease status post prior MI 5 hypertension #6 rheumatoid arthritis on Remicade #7 sleep apnea  Plan;; patient will be started on a trial of Bentyl 10 mg by mouth twice a day to 3 times a day as needed for abdominal cramping and urgency For oral thrush she will complete current course of Mycostatin oral suspension 2 weeks and is asked to call back if symptoms are persisting, could consider Diflucan Patient has a lot of questions about colonoscopy, says she had pain after colonoscopy previously related to abdominal wall varicosities. She also asks about Remicade which she has been on over the past 2 years and is controlling her arthritis symptoms well. She says she's like she would like to come off of it , we discussed that this may be helping to control her inflammatory bowel disease as well. Patient will be scheduled for colonoscopy with Dr. Carlean Purl. Procedure discussed in detail with patient including risks and benefits and she is agreeable to proceed She will need to hold Coumadin for 5 days prior to the procedure. We will communicate with her cardiologist [name] to assure that this is reasonable for this patient. I also advised patient that she may not need to have further colonoscopies in the future  if she does not have any recurrent significant polyps, given her age, and multiple comorbidities.  Amy S Esterwood PA-C 08/26/2016   Cc: [name], MD

## 2016-08-26 NOTE — Telephone Encounter (Signed)
Hold coumadin 5 days prior to procedure and resume day of procedure. Kirk Ruths

## 2016-08-26 NOTE — Telephone Encounter (Signed)
08/26/2016   RE: KEELI ACHTER DOB: [birth year] MRN: [id number]   Dear Dr. Kirk Ruths ,    We have scheduled the above patient for an endoscopic procedure. Our records show that she is on anticoagulation therapy.   Please advise as to how long the patient may come off her therapy of Coumadin prior to the procedure, which is scheduled for 11-08-2016.  Please fax back/ or route the completed form to [facility] .  Sincerely,    Amy Esterwood PA-C

## 2016-08-26 NOTE — Patient Instructions (Signed)
If you are age 71 or older, your body mass index should be between 23-30. Your Body mass index is 41.63 kg/m. If this is out of the aforementioned range listed, please consider follow up with your Primary Care Provider.  If you are age 71 or younger, your body mass index should be between 19-25. Your Body mass index is 41.63 kg/m. If this is out of the aformentioned range listed, please consider follow up with your Primary Care Provider.   You have been scheduled for a colonoscopy. Please follow written instructions given to you at your visit today.  Please pick up your prep supplies at the pharmacy within the next 1-3 days. If you use inhalers (even only as needed), please bring them with you on the day of your procedure. Your physician has requested that you go to www.startemmi.com and enter the access code given to you at your visit today. This web site gives a general overview about your procedure. However, you should still follow specific instructions given to you by our office regarding your preparation for the procedure.

## 2016-08-29 NOTE — Progress Notes (Signed)
Agree with Ms. Esterwood's assessment and plan. Carl E. Gessner, MD, FACG   

## 2016-08-29 NOTE — Telephone Encounter (Signed)
Called the patient and advised [name] said she can be off 5 days prior to the procedure date.  She can hold it on 11-03-16 and resume it on 11-09-2016. The patient verbalized understanding  The coumadin clearance instructions.

## 2016-08-31 ENCOUNTER — Other Ambulatory Visit: Payer: Self-pay | Admitting: Cardiology

## 2016-09-05 DIAGNOSIS — M0589 Other rheumatoid arthritis with rheumatoid factor of multiple sites: Secondary | ICD-10-CM | POA: Diagnosis not present

## 2016-09-20 ENCOUNTER — Ambulatory Visit (INDEPENDENT_AMBULATORY_CARE_PROVIDER_SITE_OTHER): Payer: Medicare Other | Admitting: Family Medicine

## 2016-09-20 ENCOUNTER — Ambulatory Visit: Payer: Medicare Other | Admitting: Family Medicine

## 2016-09-20 ENCOUNTER — Encounter: Payer: Self-pay | Admitting: Family Medicine

## 2016-09-20 VITALS — BP 132/86 | HR 53 | Temp 98.1°F | Ht 63.0 in | Wt 232.4 lb

## 2016-09-20 DIAGNOSIS — R739 Hyperglycemia, unspecified: Secondary | ICD-10-CM | POA: Diagnosis not present

## 2016-09-20 DIAGNOSIS — I6523 Occlusion and stenosis of bilateral carotid arteries: Secondary | ICD-10-CM | POA: Diagnosis not present

## 2016-09-20 DIAGNOSIS — Z5181 Encounter for therapeutic drug level monitoring: Secondary | ICD-10-CM | POA: Diagnosis not present

## 2016-09-20 DIAGNOSIS — F341 Dysthymic disorder: Secondary | ICD-10-CM

## 2016-09-20 DIAGNOSIS — I4891 Unspecified atrial fibrillation: Secondary | ICD-10-CM

## 2016-09-20 DIAGNOSIS — E782 Mixed hyperlipidemia: Secondary | ICD-10-CM

## 2016-09-20 DIAGNOSIS — Z7901 Long term (current) use of anticoagulants: Secondary | ICD-10-CM

## 2016-09-20 LAB — POCT INR
INR: 4
INR: 4

## 2016-09-20 MED ORDER — ESCITALOPRAM OXALATE 20 MG PO TABS: 20.0000 mg | ORAL_TABLET | Freq: Every day | ORAL | 3 refills | Status: DC

## 2016-09-20 NOTE — Patient Instructions (Signed)

## 2016-09-20 NOTE — Assessment & Plan Note (Signed)
INR is up to 4.0 for unclear reasons. Will drop coumadin to 2.5 mg daily x 5 days and 1.25 mg Tues and Sat and recheck in 2 weeks

## 2016-09-20 NOTE — Progress Notes (Signed)
Pre visit review using our clinic review tool, if applicable. No additional management support is needed unless otherwise documented below in the visit note. 

## 2016-09-20 NOTE — Assessment & Plan Note (Signed)
hgba1c acceptable, minimize simple carbs. Increase exercise as tolerated. Continue current meds 

## 2016-09-20 NOTE — Assessment & Plan Note (Signed)
She notes worsening depression since the diagnosis of rheumatoid was made. She has been on Lexapro at 10 mg for years with good results. Will try increasing dosing to 20 mg and see how she does. Consider counseling if symptoms worsen and patient willing

## 2016-09-20 NOTE — Progress Notes (Signed)
Patient ID: LATRIVIA FITZKE, female   DOB: 1946/05/28, 70 y.o.   MRN: [id number]   Subjective:    Patient ID: Rosemary Holms, female    DOB: 08/17/1946, 70 y.o.   MRN: [id number]  Chief Complaint  Patient presents with  . Follow-up    hypertension    HPI Patient is in today for follow up. She continues to struggle with chronic pain, is now well established with rheumatology for management of her RA but still struggles with daily pain. She also notes worsening anhedonia and depression due to her chronic pain and lack of energy. No suicidal ideation. Denies CP/palp/SOB/HA/congestion/fevers/GI or GU c/o. Taking meds as prescribed  Past Medical History:  Diagnosis Date  . Anxiety   . Arthritis   . CAD (coronary artery disease)   . Crohn's colitis ([place])    she reports ulcerative colitis beginning in her 9's, biopsies 2008 suggest Crohn's not UC  . Depression   . GERD (gastroesophageal reflux disease)   . Hyperlipidemia   . Hypertension   . Keloid of skin   . Obesity   . OSA (obstructive sleep apnea)   . Pancreatitis   . Paroxysmal atrial fibrillation (H[facility])   . Pseudoaneurysm of right femoral artery ([place])   . Renal oncocytoma    s/p right nephrectomy    Past Surgical History:  Procedure Laterality Date  . BREAST BIOPSY  1996   benign lesion   . Cataract surgery Left 11/13/14  . CHOLECYSTECTOMY  10/2006  . COLONOSCOPY W/ BIOPSIES    . CORONARY ANGIOPLASTY    . ESOPHAGOGASTRODUODENOSCOPY    . NEPHRECTOMY  10/2006   right secondary to oncocytoma   . RIGHT FEMORAL  PSEUDOANEURYSM & RIGHT GROIN HEMATOMA EVACUATION  02/2007  . RIGHT RENAL ARTERY REPAIR  1976  . TUBAL LIGATION      Family History  Problem Relation Age of Onset  . Colitis Father   . Heart disease Father   . Hypertension Mother   . Coronary artery disease Mother   . Stroke Mother   . Clotting disorder Mother   . Heart disease Mother   . Allergies Mother   . Stroke Brother   . Colon cancer Paternal Aunt    . Clotting disorder Brother   . Clotting disorder Sister   . Asthma Daughter   . Asthma Maternal Uncle   . Colon polyps Neg Hx   . Kidney disease Neg Hx     Social History   Social History  . Marital status: Married    Spouse name: N/A  . Number of children: 3  . Years of education: N/A   Occupational History  . Retired Retired    retired   Social History Main Topics  . Smoking status: Never Smoker  . Smokeless tobacco: Never Used  . Alcohol use No  . Drug use: No  . Sexual activity: Not Currently   Other Topics Concern  . 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 acetaminophen (TYLENOL) 325 MG tablet Take 650 mg by mouth every 6 (six) hours as needed.    Marland Kitchen amLODipine (NORVASC) 10 MG tablet TAKE 1 TABLET (10 MG TOTAL) BY MOUTH DAILY. 30 tablet 3  . ammonium lactate (LAC-HYDRIN) 12 %  lotion Apply 1 application topically daily as needed. Reported on 12/14/2015    . dofetilide (TIKOSYN) 125 MCG capsule TAKE 3 CAPSULES (375 MCG TOTAL) BY MOUTH 2 (TWO) TIMES DAILY. 180 capsule 2  . hydrocortisone (ANUSOL-HC) 25 MG suppository Place 1 suppository (25 mg total) rectally at bedtime as needed for hemorrhoids or itching. 12 suppository 2  . InFLIXimab (REMICADE IV) Inject into the vein as directed.    . loperamide (IMODIUM A-D) 2 MG tablet Take 1 mg by mouth daily as needed. Dihrrhea    . losartan (COZAAR) 100 MG tablet TAKE 1 TABLET (100 MG TOTAL) BY MOUTH DAILY. 90 tablet 1  . metoprolol tartrate (LOPRESSOR) 25 MG tablet TAKE 1 TABLET (25 MG TOTAL) BY MOUTH 2 (TWO) TIMES DAILY. 180 tablet 2  . Multiple Vitamin (MULTIVITAMIN) capsule Take 1 capsule by mouth daily. 1 daily      . nitroGLYCERIN (NITROSTAT) 0.4 MG SL tablet Place 1 tablet (0.4 mg total) under the tongue every  5 (five) minutes as needed. 1 tab under tongue every 5 min for chest pain not to exceed 3 tabs in 15 min 25 tablet 6  . nystatin (MYCOSTATIN/NYSTOP) powder Apply topically 2 (two) times daily. To affected area 45 g 1  . nystatin cream (MYCOSTATIN) Apply 1 application topically 2 (two) times daily. To affected area 30 g 0  . Probiotic Product ([place]) CAPS Take 1 capsule by mouth daily. 1 per day    . escitalopram (LEXAPRO) 10 MG tablet TAKE 1 TABLET (10 MG TOTAL) BY MOUTH DAILY. 30 tablet 5  . dicyclomine (BENTYL) 10 MG capsule Take 1 capsule (10 mg total) by mouth 3 (three) times daily before meals. (Patient not taking: Reported on 09/20/2016) 90 capsule 4  . warfarin (COUMADIN) 2.5 MG tablet Take 1 tablet (2.5 mg total) by mouth one time only at 6 PM. 120 tablet 0   No facility-administered medications prior to visit.     Allergies  Allergen Reactions  . Cefdinir     Pt cannot take; may interfere with AFib.  . Codeine     Heart beats fast   . Guaifenesin Er Nausea And Vomiting and Other (See Comments)    Headaches; can tolerate liquid.  . Latex     Breathing problems  . Mucilgen [Psyllium]   . Percocet [Oxycodone-Acetaminophen]     Itching & swelling in jaw    Review of Systems  Constitutional: Positive for malaise/fatigue. Negative for fever.  HENT: Negative for congestion.   Eyes: Negative for blurred vision.  Respiratory: Negative for shortness of breath.   Cardiovascular: Negative for chest pain, palpitations and leg swelling.  Gastrointestinal: Negative for abdominal pain, blood in stool and nausea.  Genitourinary: Negative for dysuria and frequency.  Musculoskeletal: Positive for back pain, joint pain and myalgias. Negative for falls.  Skin: Negative for rash.  Neurological: Negative for dizziness, loss of consciousness and headaches.  Endo/Heme/Allergies: Negative for environmental allergies.  Psychiatric/Behavioral: Positive for depression. The patient  is not nervous/anxious.        Objective:    Physical Exam  BP 132/86 (BP Location: Left Arm, Patient Position: Sitting, Cuff Size: Large)   Pulse (!) 53   Temp 98.1 F (36.7 C) (Oral)   Ht 5\' 3"  (1.6 m)   Wt 232 lb 6 oz (105.4 kg)   SpO2 (!) 53%   BMI 41.16 kg/m  Wt Readings from Last 3 Encounters:  09/20/16 232 lb 6 oz (105.4 kg)  08/26/16  235 lb (106.6 kg)  08/22/16 237 lb 9.6 oz (107.8 kg)     Lab Results  Component Value Date   WBC 9.0 06/20/2016   HGB 14.1 06/20/2016   HCT 42.3 06/20/2016   PLT 234.0 06/20/2016   GLUCOSE 96 06/20/2016   CHOL 172 06/20/2016   TRIG 136.0 06/20/2016   HDL 45.80 06/20/2016   LDLCALC 99 06/20/2016   ALT 19 06/20/2016   AST 23 06/20/2016   NA 142 06/20/2016   K 4.5 06/20/2016   CL 104 06/20/2016   CREATININE 1.02 06/20/2016   BUN 16 06/20/2016   CO2 29 06/20/2016   TSH 3.12 06/20/2016   INR 4.0 09/20/2016   HGBA1C 5.4 06/20/2016    Lab Results  Component Value Date   TSH 3.12 06/20/2016   Lab Results  Component Value Date   WBC 9.0 06/20/2016   HGB 14.1 06/20/2016   HCT 42.3 06/20/2016   MCV 93.8 06/20/2016   PLT 234.0 06/20/2016   Lab Results  Component Value Date   NA 142 06/20/2016   K 4.5 06/20/2016   CO2 29 06/20/2016   GLUCOSE 96 06/20/2016   BUN 16 06/20/2016   CREATININE 1.02 06/20/2016   BILITOT 0.6 06/20/2016   ALKPHOS 70 06/20/2016   AST 23 06/20/2016   ALT 19 06/20/2016   PROT 8.0 06/20/2016   ALBUMIN 4.0 06/20/2016   CALCIUM 9.4 06/20/2016   ANIONGAP 11 11/13/2015   GFR 56.81 (L) 06/20/2016   Lab Results  Component Value Date   CHOL 172 06/20/2016   Lab Results  Component Value Date   HDL 45.80 06/20/2016   Lab Results  Component Value Date   LDLCALC 99 06/20/2016   Lab Results  Component Value Date   TRIG 136.0 06/20/2016   Lab Results  Component Value Date   CHOLHDL 4 06/20/2016   Lab Results  Component Value Date   HGBA1C 5.4 06/20/2016       Assessment & Plan:    Problem List Items Addressed This Visit    Hyperlipidemia    Encouraged heart healthy diet, increase exercise, avoid trans fats, consider a krill oil cap daily      DEPRESSION/ANXIETY    She notes worsening depression since the diagnosis of rheumatoid was made. She has been on Lexapro at 10 mg for years with good results. Will try increasing dosing to 20 mg and see how she does. Consider counseling if symptoms worsen and patient willing      Relevant Medications   escitalopram (LEXAPRO) 20 MG tablet   ATRIAL FIBRILLATION    INR is up to 4.0 for unclear reasons. Will drop coumadin to 2.5 mg daily x 5 days and 1.25 mg Tues and Sat and recheck in 2 weeks      Hyperglycemia    hgba1c acceptable, minimize simple carbs. Increase exercise as tolerated. Continue current meds      Encounter for therapeutic drug monitoring    Other Visit Diagnoses    Long-term (current) use of anticoagulants    -  Primary   Relevant Orders   POCT INR (Completed)   POCT INR (Completed)      I have discontinued Ms. Consalvo's escitalopram. I am also having her start on escitalopram. Additionally, I am having her maintain her loperamide, multivitamin, [facility], ammonium lactate, InFLIXimab (REMICADE IV), nitroGLYCERIN, acetaminophen, metoprolol tartrate, nystatin cream, nystatin, amLODipine, hydrocortisone, losartan, warfarin, dicyclomine, and dofetilide.  Meds ordered this encounter  Medications  . escitalopram (LEXAPRO)  20 MG tablet    Sig: Take 1 tablet (20 mg total) by mouth daily.    Dispense:  30 tablet    Refill:  3     Penni Homans, MD

## 2016-09-20 NOTE — Assessment & Plan Note (Signed)
Encouraged heart healthy diet, increase exercise, avoid trans fats, consider a krill oil cap daily 

## 2016-09-20 NOTE — Patient Instructions (Signed)
Take 2.5 mg  Coumadin Monday, Wednesday,Thursday,Friday,Sunday. On Tuesday and Saturday take 1.25mg .Return for recheck of INR in 2 weeks. Appointment scheduled for 10/04/16 @ 9:45 am.

## 2016-09-21 NOTE — Progress Notes (Signed)
HPI: FU coronary artery disease and atrial fibrillation. Last cardiac catheterization on Mar 06, 2007 showed an ejection fraction of 55%. There is nonobstructive plaque in the LAD, and circumflex and the right coronary artery was normal. 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 Also note the patient is extremely sensitive to Coumadin. MRA of the abdomen in September of 2012 showed no left renal artery stenosis and previous right nephrectomy. 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 Echocardiogram in June of 2014 showed normal LV function, grade 1 diastolic dysfunction and mild left atrial enlargement. Carotid Dopplers in March 2017 showed 1-39% stenosis bilaterally. Event monitor ordered at last office visit but not performed. Since I last saw her, she denies dyspnea, exertional chest pain or syncope. She is having occasional palpitations described as her heart racing for approximately 10-15 seconds. Resolves spontaneously. She also in October had 2 episodes of transient dizziness but no frank syncope. These episodes were not preceded by palpitations.  Current Outpatient Prescriptions  Medication Sig Dispense Refill  . acetaminophen (TYLENOL) 325 MG tablet Take 650 mg by mouth every 6 (six) hours as needed.    Marland Kitchen amLODipine (NORVASC) 10 MG tablet TAKE 1 TABLET (10 MG TOTAL) BY MOUTH DAILY. 30 tablet 3  . dofetilide (TIKOSYN) 125 MCG capsule TAKE 3 CAPSULES (375 MCG TOTAL) BY MOUTH 2 (TWO) TIMES DAILY. 180 capsule 2  . escitalopram (LEXAPRO) 20 MG tablet Take 1 tablet (20 mg total) by mouth daily. (Patient taking differently: Take 10 mg by mouth daily. ) 30 tablet 3  . hydrocortisone (ANUSOL-HC) 25 MG suppository Place 1 suppository (25 mg total) rectally at bedtime as needed for hemorrhoids or itching. 12 suppository 2  . InFLIXimab (REMICADE IV) Inject into the vein as  directed.    . loperamide (IMODIUM A-D) 2 MG tablet Take 1 mg by mouth daily as needed. Dihrrhea    . losartan (COZAAR) 100 MG tablet TAKE 1 TABLET (100 MG TOTAL) BY MOUTH DAILY. 90 tablet 1  . metoprolol tartrate (LOPRESSOR) 25 MG tablet TAKE 1 TABLET (25 MG TOTAL) BY MOUTH 2 (TWO) TIMES DAILY. 180 tablet 2  . Multiple Vitamin (MULTIVITAMIN) capsule Take 1 capsule by mouth daily. 1 daily      . nitroGLYCERIN (NITROSTAT) 0.4 MG SL tablet Place 1 tablet (0.4 mg total) under the tongue every 5 (five) minutes as needed. 1 tab under tongue every 5 min for chest pain not to exceed 3 tabs in 15 min 25 tablet 6  . nystatin (MYCOSTATIN/NYSTOP) powder Apply topically 2 (two) times daily. To affected area 45 g 1  . nystatin cream (MYCOSTATIN) Apply 1 application topically 2 (two) times daily. To affected area 30 g 0  . Probiotic Product ([place]) CAPS Take 1 capsule by mouth daily. 1 per day    . warfarin (COUMADIN) 2.5 MG tablet Take 1 tablet (2.5 mg total) by mouth one time only at 6 PM. 120 tablet 0   No current facility-administered medications for this visit.      Past Medical History:  Diagnosis Date  . Anxiety   . Arthritis   . CAD (coronary artery disease)   . Crohn's colitis ([place])    she reports ulcerative colitis beginning in her 62's, biopsies 2008 suggest Crohn's not UC  . Depression   . GERD (gastroesophageal reflux disease)   . Hyperlipidemia   .  Hypertension   . Keloid of skin   . Obesity   . OSA (obstructive sleep apnea)   . Pancreatitis   . Paroxysmal atrial fibrillation ([facility])   . 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 Renal oncocytoma    s/p right nephrectomy    Past Surgical History:  Procedure Laterality Date  . BREAST BIOPSY  1996   benign lesion   . Cataract surgery Left 11/13/14  . CHOLECYSTECTOMY  10/2006  . COLONOSCOPY W/ BIOPSIES    . CORONARY ANGIOPLASTY    . ESOPHAGOGASTRODUODENOSCOPY    . NEPHRECTOMY  10/2006   right secondary to  oncocytoma   . RIGHT FEMORAL  PSEUDOANEURYSM & RIGHT GROIN HEMATOMA EVACUATION  02/2007  . RIGHT RENAL ARTERY REPAIR  1976  . TUBAL LIGATION      Social History   Social History  . Marital status: Married    Spouse name: N/A  . Number of children: 3  . Years of education: N/A   Occupational History  . Retired Retired    retired   Social History Main Topics  . Smoking status: Never Smoker  . Smokeless tobacco: Never Used  . Alcohol use No  . Drug use: No  . Sexual activity: Not Currently   Other Topics Concern  . 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 Colitis Father   . Heart disease Father   . Hypertension Mother   . Coronary artery disease Mother   . Stroke Mother   . Clotting disorder Mother   . Heart disease Mother   . Allergies Mother   . Stroke Brother   . Colon cancer Paternal Aunt   . Clotting disorder Brother   . Clotting disorder Sister   . Asthma Daughter   . Asthma Maternal Uncle   . Colon polyps Neg Hx   . Kidney disease Neg Hx     ROS: no fevers or chills, productive cough, hemoptysis, dysphasia, odynophagia, melena, hematochezia, dysuria, hematuria, rash, seizure activity, orthopnea, PND, pedal edema, claudication. Remaining systems are negative.  Physical Exam: Well-developed obese in no acute distress.  Skin is warm and dry.  HEENT is normal.  Neck is supple.  Chest is clear to auscultation with normal expansion.  Cardiovascular exam is regular rate and rhythm.  Abdominal exam nontender or distended. No masses palpated. Extremities show no edema. neuro grossly intact  ECG-Sinus bradycardia at a rate of 47. Nonspecific ST changes.  A/P  1 paroxysmal atrial fibrillation-the patient is in sinus rhythm on electrocardiogram today. Her heart rate is  decreased. Decrease metoprolol to 12.5 mg twice a day. Continue tikosyn. Check potassium, renal function and magnesium. Continue Coumadin. We discussed the Watchman device today. However she is not having bleeding with her Coumadin and I think we should continue with anticoagulation for now. She is having intermittent palpitations and had 2 dizzy spells. These were not associated with palpitations. I will arrange an event monitor to further assess. Question recurrent atrial fibrillation or post termination pauses.  2 Carotid artery disease-intolerant to statins. No aspirin given need for Coumadin.  3 coronary artery disease-no aspirin given need for Coumadin. Intolerant to statins.  4 hypertension-blood pressure is mildly elevated but she states is controlled at home. We will  follow and increase medications as needed.  5 hyperlipidemia-continue diet. Intolerant to statins.   Kirk Ruths, MD

## 2016-09-28 ENCOUNTER — Ambulatory Visit (INDEPENDENT_AMBULATORY_CARE_PROVIDER_SITE_OTHER): Payer: Medicare Other | Admitting: Cardiology

## 2016-09-28 ENCOUNTER — Encounter: Payer: Self-pay | Admitting: *Deleted

## 2016-09-28 ENCOUNTER — Encounter: Payer: Self-pay | Admitting: Cardiology

## 2016-09-28 ENCOUNTER — Other Ambulatory Visit (INDEPENDENT_AMBULATORY_CARE_PROVIDER_SITE_OTHER): Payer: Medicare Other

## 2016-09-28 VITALS — BP 143/91 | HR 47 | Ht 63.0 in | Wt 232.8 lb

## 2016-09-28 DIAGNOSIS — R002 Palpitations: Secondary | ICD-10-CM

## 2016-09-28 DIAGNOSIS — I4891 Unspecified atrial fibrillation: Secondary | ICD-10-CM

## 2016-09-28 DIAGNOSIS — I6523 Occlusion and stenosis of bilateral carotid arteries: Secondary | ICD-10-CM

## 2016-09-28 LAB — MAGNESIUM: MAGNESIUM: 2.1 mg/dL (ref 1.5–2.5)

## 2016-09-28 LAB — BASIC METABOLIC PANEL
BUN: 14 mg/dL (ref 7–25)
CALCIUM: 9 mg/dL (ref 8.6–10.4)
CO2: 22 mmol/L (ref 20–31)
CREATININE: 1.02 mg/dL — AB (ref 0.60–0.93)
Chloride: 104 mmol/L (ref 98–110)
Glucose, Bld: 95 mg/dL (ref 65–99)
Potassium: 4.2 mmol/L (ref 3.5–5.3)
SODIUM: 138 mmol/L (ref 135–146)

## 2016-09-28 MED ORDER — METOPROLOL TARTRATE 25 MG PO TABS: 12.5000 mg | ORAL_TABLET | Freq: Two times a day (BID) | ORAL | 3 refills | Status: DC

## 2016-09-28 NOTE — Patient Instructions (Signed)
Medication Instructions:   DECREASE METOPROLOL TO 12.5 MG TWICE DAILY= 1/2 OF THE 25 MG TABLET TWICE DAILY  Labwork:  Your physician recommends that you HAVE LAB WORK TODAY IN DR BLYTHE'S OFFICE  Testing/Procedures:  Your physician has recommended that you wear an event monitor. Event monitors are medical devices that record the heart's electrical activity. Doctors most often [place] these monitors to diagnose arrhythmias. Arrhythmias are problems with the speed or rhythm of the heartbeat. The monitor is a small, portable device. You can wear one while you do your normal daily activities. This is usually used to diagnose what is causing palpitations/syncope (passing out).  WILL BE MAILED TO YOUR HOME  Follow-Up:  Your physician recommends that you schedule a follow-up appointment in: [place]

## 2016-09-28 NOTE — Progress Notes (Signed)
Patient ID: [name], female   DOB: 1946/04/21, 70 y.o.   MRN: [id number] Patient enrolled for [employer] to mail a 30 day [employer] Cardiac event monitor to her home.

## 2016-10-03 ENCOUNTER — Ambulatory Visit (INDEPENDENT_AMBULATORY_CARE_PROVIDER_SITE_OTHER): Payer: Medicare Other

## 2016-10-03 DIAGNOSIS — R002 Palpitations: Secondary | ICD-10-CM | POA: Diagnosis not present

## 2016-10-04 ENCOUNTER — Ambulatory Visit (INDEPENDENT_AMBULATORY_CARE_PROVIDER_SITE_OTHER): Payer: Medicare Other

## 2016-10-04 DIAGNOSIS — I4891 Unspecified atrial fibrillation: Secondary | ICD-10-CM

## 2016-10-04 DIAGNOSIS — I6523 Occlusion and stenosis of bilateral carotid arteries: Secondary | ICD-10-CM | POA: Diagnosis not present

## 2016-10-04 DIAGNOSIS — Z5181 Encounter for therapeutic drug level monitoring: Secondary | ICD-10-CM | POA: Diagnosis not present

## 2016-10-04 DIAGNOSIS — Z7901 Long term (current) use of anticoagulants: Secondary | ICD-10-CM | POA: Diagnosis not present

## 2016-10-04 LAB — POCT INR: INR: 4.1

## 2016-10-04 NOTE — Progress Notes (Signed)
Pre visit review using our clinic tool,if applicable. No additional management support is needed unless otherwise documented below in the visit note.   Patient in for INR re-check. IINR=4.1 Goal = 2.0-3.0

## 2016-10-05 ENCOUNTER — Telehealth: Payer: Self-pay | Admitting: Cardiology

## 2016-10-05 NOTE — Telephone Encounter (Signed)
Would like to know how long she has to wear monitor

## 2016-10-05 NOTE — Telephone Encounter (Signed)
Spoke with pt, questions regarding monitor answered. 

## 2016-10-06 ENCOUNTER — Ambulatory Visit (INDEPENDENT_AMBULATORY_CARE_PROVIDER_SITE_OTHER): Payer: Medicare Other | Admitting: Behavioral Health

## 2016-10-06 DIAGNOSIS — I4891 Unspecified atrial fibrillation: Secondary | ICD-10-CM

## 2016-10-06 DIAGNOSIS — Z5181 Encounter for therapeutic drug level monitoring: Secondary | ICD-10-CM

## 2016-10-06 LAB — POCT INR: INR: 3.5

## 2016-10-06 NOTE — Progress Notes (Signed)
Pre visit review using our clinic review tool, if applicable. No additional management support is needed unless otherwise documented below in the visit note.  Patient in clinic today for INR check. Reviewed medication & current regimen with the patient. She reported no changes or positive findings. INR reading was 3.5.  Per Dr. Charlett Blake: Take Coumadin 2.5 mg on Tuesday & Saturday. All other days take Coumadin 1.25 mg. Return for INR check on Tuesday, 10/11/16.  Patient was made aware of the provider's recommendations and voiced understanding.

## 2016-10-06 NOTE — Patient Instructions (Signed)
Per Dr. Charlett Blake: Take Coumadin 2.5 mg on Tuesday & Saturday. All other days take Coumadin 1.25 mg. Return for INR check on Tuesday, 10/11/16.

## 2016-10-06 NOTE — Progress Notes (Signed)
RN coagulation note reviewed. Agree with documention and plan.

## 2016-10-12 ENCOUNTER — Ambulatory Visit (INDEPENDENT_AMBULATORY_CARE_PROVIDER_SITE_OTHER): Payer: Medicare Other

## 2016-10-12 DIAGNOSIS — Z5181 Encounter for therapeutic drug level monitoring: Secondary | ICD-10-CM

## 2016-10-12 DIAGNOSIS — Z7901 Long term (current) use of anticoagulants: Secondary | ICD-10-CM

## 2016-10-12 DIAGNOSIS — I480 Paroxysmal atrial fibrillation: Secondary | ICD-10-CM

## 2016-10-12 LAB — POCT INR: INR: 2.1

## 2016-10-12 NOTE — Progress Notes (Signed)
Pre visit review using our clinic tool,if applicable. No additional management support is needed unless otherwise documented below in the visit note.   Patient in for INR today. INR = 2.1. Goal is 2.0-3.0. Per Dr. Lorelei Pont covering for Dr, Charlett Blake patient to take 2.5mg  on Sat and Tuesday and 1.25 mg all other days. Patient to return for re-check in 1 month.

## 2016-10-25 ENCOUNTER — Telehealth: Payer: Self-pay | Admitting: Cardiology

## 2016-10-25 ENCOUNTER — Other Ambulatory Visit: Payer: Self-pay | Admitting: Family Medicine

## 2016-10-25 MED ORDER — ESCITALOPRAM OXALATE 20 MG PO TABS: 10.0000 mg | ORAL_TABLET | Freq: Every day | ORAL | 0 refills | Status: DC

## 2016-10-25 NOTE — Telephone Encounter (Signed)
Spoke with patient, she would like to switch INR checks back to our office.  Appt made for this Friday Jan 5.

## 2016-10-25 NOTE — Telephone Encounter (Signed)
Please call,She wants to know if she can switch back coming here to get her INR?

## 2016-10-26 ENCOUNTER — Telehealth: Payer: Self-pay | Admitting: *Deleted

## 2016-10-26 ENCOUNTER — Other Ambulatory Visit: Payer: Self-pay | Admitting: *Deleted

## 2016-10-26 MED ORDER — DICYCLOMINE HCL 10 MG PO CAPS: 10.0000 mg | ORAL_CAPSULE | Freq: Three times a day (TID) | ORAL | 3 refills | Status: DC

## 2016-10-26 NOTE — Telephone Encounter (Signed)
Received fax from [facility], [place], [place]. Needed 90 day supply per insurance company of Bentyl 10mg .  Sent the prescription, 90 day supply .

## 2016-10-27 ENCOUNTER — Other Ambulatory Visit: Payer: Self-pay | Admitting: *Deleted

## 2016-10-27 MED ORDER — AMLODIPINE BESYLATE 10 MG PO TABS: ORAL_TABLET | ORAL | 3 refills | Status: DC

## 2016-10-28 ENCOUNTER — Ambulatory Visit (INDEPENDENT_AMBULATORY_CARE_PROVIDER_SITE_OTHER): Payer: Medicare Other | Admitting: Pharmacist Clinician (PhC)/ Clinical Pharmacy Specialist

## 2016-10-28 DIAGNOSIS — I480 Paroxysmal atrial fibrillation: Secondary | ICD-10-CM

## 2016-10-28 DIAGNOSIS — Z5181 Encounter for therapeutic drug level monitoring: Secondary | ICD-10-CM | POA: Diagnosis not present

## 2016-10-28 LAB — POCT INR: INR: 2.5

## 2016-10-31 DIAGNOSIS — Z79899 Other long term (current) drug therapy: Secondary | ICD-10-CM | POA: Diagnosis not present

## 2016-10-31 DIAGNOSIS — M0589 Other rheumatoid arthritis with rheumatoid factor of multiple sites: Secondary | ICD-10-CM | POA: Diagnosis not present

## 2016-11-03 DIAGNOSIS — K5090 Crohn's disease, unspecified, without complications: Secondary | ICD-10-CM | POA: Diagnosis not present

## 2016-11-03 DIAGNOSIS — Q600 Renal agenesis, unilateral: Secondary | ICD-10-CM | POA: Diagnosis not present

## 2016-11-03 DIAGNOSIS — M545 Low back pain: Secondary | ICD-10-CM | POA: Diagnosis not present

## 2016-11-03 DIAGNOSIS — M65322 Trigger finger, left index finger: Secondary | ICD-10-CM | POA: Diagnosis not present

## 2016-11-03 DIAGNOSIS — Z85828 Personal history of other malignant neoplasm of skin: Secondary | ICD-10-CM | POA: Diagnosis not present

## 2016-11-03 DIAGNOSIS — M0589 Other rheumatoid arthritis with rheumatoid factor of multiple sites: Secondary | ICD-10-CM | POA: Diagnosis not present

## 2016-11-03 DIAGNOSIS — Z6841 Body Mass Index (BMI) 40.0 and over, adult: Secondary | ICD-10-CM | POA: Diagnosis not present

## 2016-11-03 DIAGNOSIS — Z79899 Other long term (current) drug therapy: Secondary | ICD-10-CM | POA: Diagnosis not present

## 2016-11-03 DIAGNOSIS — H209 Unspecified iridocyclitis: Secondary | ICD-10-CM | POA: Diagnosis not present

## 2016-11-04 ENCOUNTER — Telehealth: Payer: Self-pay | Admitting: Family Medicine

## 2016-11-04 NOTE — Telephone Encounter (Signed)
FYI to provider.   Pt lvm stating that she will be going to [place] line to have her INR checked going forward. Pt says that she has a low immune system and would like to be around people that is sick.

## 2016-11-08 ENCOUNTER — Encounter: Payer: Medicare Other | Admitting: Internal Medicine

## 2016-11-11 ENCOUNTER — Ambulatory Visit: Payer: Medicare Other

## 2016-11-14 ENCOUNTER — Encounter: Payer: Self-pay | Admitting: Cardiovascular Disease

## 2016-11-14 ENCOUNTER — Ambulatory Visit (INDEPENDENT_AMBULATORY_CARE_PROVIDER_SITE_OTHER): Payer: Medicare Other | Admitting: Cardiology

## 2016-11-14 ENCOUNTER — Encounter: Payer: Self-pay | Admitting: Cardiology

## 2016-11-14 ENCOUNTER — Telehealth: Payer: Self-pay | Admitting: Cardiology

## 2016-11-14 ENCOUNTER — Ambulatory Visit (INDEPENDENT_AMBULATORY_CARE_PROVIDER_SITE_OTHER): Payer: Medicare Other | Admitting: Pharmacist

## 2016-11-14 VITALS — BP 135/94 | HR 105 | Ht 63.0 in | Wt 230.8 lb

## 2016-11-14 DIAGNOSIS — Z01818 Encounter for other preprocedural examination: Secondary | ICD-10-CM

## 2016-11-14 DIAGNOSIS — I4891 Unspecified atrial fibrillation: Secondary | ICD-10-CM

## 2016-11-14 DIAGNOSIS — I2510 Atherosclerotic heart disease of native coronary artery without angina pectoris: Secondary | ICD-10-CM | POA: Diagnosis not present

## 2016-11-14 DIAGNOSIS — I480 Paroxysmal atrial fibrillation: Secondary | ICD-10-CM

## 2016-11-14 DIAGNOSIS — Z5181 Encounter for therapeutic drug level monitoring: Secondary | ICD-10-CM

## 2016-11-14 DIAGNOSIS — I10 Essential (primary) hypertension: Secondary | ICD-10-CM

## 2016-11-14 LAB — CBC
HEMATOCRIT: 45.9 % — AB (ref 35.0–45.0)
Hemoglobin: 15.3 g/dL (ref 11.7–15.5)
MCH: 31.4 pg (ref 27.0–33.0)
MCHC: 33.3 g/dL (ref 32.0–36.0)
MCV: 94.3 fL (ref 80.0–100.0)
MPV: 10.8 fL (ref 7.5–12.5)
Platelets: 255 10*3/uL (ref 140–400)
RBC: 4.87 MIL/uL (ref 3.80–5.10)
RDW: 13.5 % (ref 11.0–15.0)
WBC: 9.7 10*3/uL (ref 3.8–10.8)

## 2016-11-14 LAB — TSH: TSH: 2.79 mIU/L

## 2016-11-14 LAB — POCT INR: INR: 2.3

## 2016-11-14 NOTE — Patient Instructions (Signed)
Medication Instructions:  TAKE METOPROLOL 25MG  (1 WHOLE TABLET) DAILY UNTIL CARDIO-VERSION, DO NOT TAKE THE MORNING OF THE CARDIOVERSION   If you need a refill on your cardiac medications before your next appointment, please call your pharmacy.  Labwork: UA WITH CULTURE, MAGNESIUM, CBC, BMP, Pt, PTT,TSH TODAY AT [facility] ON THE 1ST FLOOR  Testing/Procedures: Your physician has recommended that you have a Cardioversion (DCCV). Electrical Cardioversion uses a jolt of electricity to your heart either through paddles or wired patches attached to your chest. This is a controlled, usually prescheduled, procedure. Defibrillation is done under light anesthesia in the hospital, and you usually go home the day of the procedure. This is done to get your heart back into a normal rhythm. You are not awake for the procedure. Please see the instruction sheet given to you today.  Follow-Up: Your physician recommends that you schedule a follow-up appointment WITH DR Stanford Breed AFTER CARDIO-VERSION RESULTS    Thank you for choosing [facility] at [facility]!!    Sharyn Lull, LPN Cecilie Kicks, NP

## 2016-11-14 NOTE — Telephone Encounter (Signed)
Spoke with pt, she woke yesterday morning with rapid heart rate that continues today. Her bp is 149/124 to 133/108 with pulse of 93-97. She has taken all of her medications today. Patient instructed to take extra 1/2 of lopressor now and per dr Stanford Breed will bring the patient in to have EKG. If she is in atrial fib she will need to be set up for DCCV. Follow up scheduled today with laura ingold np

## 2016-11-14 NOTE — Telephone Encounter (Signed)
Pt calling regarding being in Afib since last night-wants to know what [name] would want her to do-pls advise

## 2016-11-14 NOTE — Progress Notes (Signed)
Cardiology Office Note   Date:  11/14/2016   ID:  [name], DOB 21-Feb-1946, MRN [id number]  PCP:  Penni Homans, MD  Cardiologist:  [name]     Chief Complaint  Patient presents with  . Tachycardia  . Chest Pain    chest discomfort      History of Present Illness: [name] is a 71 y.o. female who presents for possible atrial fib.  She believes she went into it yesterday AM it has been a year since she has had a fib.    She has a hx. Of coronary artery disease and atrial fibrillation. Last cardiac catheterization on Mar 06, 2007 showed an ejection fraction of 55%. There is nonobstructive plaque in the LAD, and circumflex and the right coronary artery was normal. 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 Also note the patient is extremely sensitive to Coumadin. MRA of the abdomen in September of 2012 showed no left renal artery stenosis and previous right nephrectomy. 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 Echocardiogram in June of 2014 showed normal LV function, grade 1 diastolic dysfunction and mild left atrial enlargement. Carotid Dopplers in March 2017 showed 1-39% stenosis bilaterally. Event monitor ordered at last office visit but not performed. Since I last saw her, she denies dyspnea, exertional chest pain or syncope. She is having occasional palpitations described as her heart racing for approximately 10-15 seconds. Resolves spontaneously. She also in October had 2 episodes of transient dizziness but no frank syncope. These episodes were not preceded by palpitations  Event monitor  Sinus with pacs, pvcs and ectopic atrial tachycardia. This was in early jan 2018  Today she called with what she thought was a fib - different than her episodes of atrial tach.  She has been stable on Tikosyn.  She is in a fib  By EKG she went into it on  Sunday-yesterday.  She took an extra half of BB today.  Normally she has increased depression and fatigue on 25 mg metoprolol BID and her HR was slower.  Was decreased to 12.5 BID in Dec.  occ chest pressure but not much at all.  No SOB more of an awareness of rapid HR.  She has been on Tikosyn since 2015.  She has done well.    Past Medical History:  Diagnosis Date  . Anxiety   . Arthritis   . CAD (coronary artery disease)   . Crohn's colitis ([place])    she reports ulcerative colitis beginning in her 60's, biopsies 2008 suggest Crohn's not UC  . Depression   . GERD (gastroesophageal reflux disease)   . Hyperlipidemia   . Hypertension   . Keloid of skin   . Obesity   . OSA (obstructive sleep apnea)   . Pancreatitis   . Paroxysmal atrial fibrillation ([facility])   . 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 Renal oncocytoma    s/p right nephrectomy    Past Surgical History:  Procedure Laterality Date  . BREAST BIOPSY  1996   benign lesion   . Cataract surgery Left 11/13/14  . CHOLECYSTECTOMY  10/2006  . COLONOSCOPY W/ BIOPSIES    . CORONARY ANGIOPLASTY    . ESOPHAGOGASTRODUODENOSCOPY    . NEPHRECTOMY  10/2006   right secondary to oncocytoma   . RIGHT FEMORAL  PSEUDOANEURYSM & RIGHT GROIN HEMATOMA EVACUATION  02/2007  . RIGHT RENAL ARTERY REPAIR  1976  . TUBAL LIGATION       Current Outpatient Prescriptions  Medication Sig Dispense Refill  . acetaminophen (TYLENOL) 325 MG tablet Take 650 mg by mouth every 6 (six) hours as needed.    Marland Kitchen amLODipine (NORVASC) 10 MG tablet TAKE 1 TABLET (10 MG TOTAL) BY MOUTH DAILY. 90 tablet 3  . dofetilide (TIKOSYN) 125 MCG capsule TAKE 3 CAPSULES (375 MCG TOTAL) BY MOUTH 2 (TWO) TIMES DAILY. 180 capsule 2  . escitalopram (LEXAPRO) 10 MG tablet TAKE 1 TABLET (10 MG TOTAL) BY MOUTH DAILY.  5  . hydrocortisone (ANUSOL-HC) 25 MG suppository Place 1 suppository (25 mg total) rectally at bedtime as needed for hemorrhoids or itching. 12 suppository 2    . InFLIXimab (REMICADE IV) Inject into the vein as directed.    . loperamide (IMODIUM A-D) 2 MG tablet Take 1 mg by mouth daily as needed. Dihrrhea    . losartan (COZAAR) 100 MG tablet TAKE 1 TABLET (100 MG TOTAL) BY MOUTH DAILY. 90 tablet 1  . metoprolol tartrate (LOPRESSOR) 25 MG tablet Take 0.5 tablets (12.5 mg total) by mouth 2 (two) times daily. 90 tablet 3  . Multiple Vitamin (MULTIVITAMIN) capsule Take 1 capsule by mouth daily. 1 daily      . nitroGLYCERIN (NITROSTAT) 0.4 MG SL tablet Place 1 tablet (0.4 mg total) under the tongue every 5 (five) minutes as needed. 1 tab under tongue every 5 min for chest pain not to exceed 3 tabs in 15 min 25 tablet 6  . Probiotic Product ([place]) CAPS Take 1 capsule by mouth daily. 1 per day    . Vitamin D, Cholecalciferol, 1000 units TABS Take by mouth daily.    Marland Kitchen warfarin (COUMADIN) 2.5 MG tablet Take 1 tablet (2.5 mg total) by mouth one time only at 6 PM. 120 tablet 0   No current facility-administered medications for this visit.     Allergies:   Cefdinir; Codeine; Guaifenesin er; Latex; Mucilgen [psyllium]; and Percocet [oxycodone-acetaminophen]    Social History:  The patient  reports that she has never smoked. She has never used smokeless tobacco. She reports that she does not drink alcohol or use drugs.   Family History:  The patient's family history includes Allergies in her mother; Asthma in her daughter and maternal uncle; Clotting disorder in her brother, mother, and sister; Colitis in her father; Colon cancer in her paternal aunt; Coronary artery disease in her mother; Heart disease in her father and mother; Hypertension in her mother; Stroke in her brother and mother.    ROS:  General:no colds or fevers, no weight changes Skin:no rashes or ulcers HEENT:no blurred vision, no congestion CV:see HPI PUL:see HPI GI:no diarrhea constipation or melena, no indigestion GU:no hematuria, no dysuria- freq urination - will check  U/A MS:no joint pain, no claudication Neuro:no syncope, no lightheadedness Endo:no diabetes, no thyroid disease  Wt Readings from Last 3 Encounters:  11/14/16 230 lb 12.8 oz (104.7 kg)  09/28/16 232 lb 12.8 oz (105.6 kg)  09/20/16 232 lb 6 oz (105.4 kg)     PHYSICAL EXAM: VS:  BP (!) 135/94   Pulse (!) 105   Ht 5\' 3"  (1.6 m)   Wt 230 lb 12.8 oz (104.7 kg)   BMI 40.88 kg/m  , BMI Body mass index is 40.88 kg/m. General:Pleasant affect, NAD Skin:Warm and dry, brisk capillary refill HEENT:normocephalic, sclera clear, mucus membranes moist Neck:supple, no JVD, no bruits  Heart:irreg irreg without murmur, gallup, rub or click Lungs:clear without rales, rhonchi, or wheezes [id number], non tender, + BS, do not palpate liver spleen or masses Ext:no lower ext edema, 2+ pedal pulses, 2+ radial pulses Neuro:alert and oriented X 3, MAE, follows commands, + facial symmetry    EKG:  EKG is ordered today. The ekg ordered today demonstrates a fib with RVR at 105.  INR today was 2.3  Previous INRs  12/14 was 3.5 12/20 2.1 1/5 was 2.5      Recent Labs: 06/20/2016: ALT 19; Hemoglobin 14.1; Platelets 234.0; TSH 3.12 09/28/2016: BUN 14; Creat 1.02; Magnesium 2.1; Potassium 4.2; Sodium 138    Lipid Panel    Component Value Date/Time   CHOL 172 06/20/2016 1027   TRIG 136.0 06/20/2016 1027   HDL 45.80 06/20/2016 1027   CHOLHDL 4 06/20/2016 1027   VLDL 27.2 06/20/2016 1027   LDLCALC 99 06/20/2016 1027       Other studies Reviewed: Additional studies/ records that were reviewed today include:   Echo 04/02/13. Study Conclusions  - Left ventricle: The cavity size was normal. Wall thickness was increased in a pattern of mild LVH. Systolic function was normal. The estimated ejection fraction was in the range of 60% to 65%. Wall motion was normal; there were no regional wall motion abnormalities. Doppler parameters are consistent with abnormal left ventricular  relaxation (grade 1 diastolic dysfunction). - Left atrium: The atrium was mildly dilated. - Atrial septum: No defect or patent foramen ovale was identified. - Pulmonary arteries: PA peak pressure: 48mm Hg (S).  ASSESSMENT AND PLAN:  1.  Recurrent a fib with RVR, discussed with [name] will plan DCCV this week her INRs have been therapeutic.  Discussed risk and benefits of DCCV and she is willing to proceed.  Will check labs today including thyroid.   Continue Tikosyn if another breakthrough will send to EP for possible ablation.   2.  anticoagulation on coumadin with stable INR  3. CAD no significant chest pain  There is nonobstructive plaque in the LAD, and circumflex and the right coronary artery was normal. 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  4. Bradycardia, once back in SR would decrease BB back to 12.5 BID  She will hold AM of procedure as well.     Current medicines are reviewed with the patient today.  The patient Has no concerns regarding medicines.  The following changes have been made:  See above Labs/ tests ordered today include:see above  Disposition:   FU:  see above  Signed, Cecilie Kicks, NP  11/14/2016 5:32 PM    [facility], [place], [place], [place] Phone: [phone]; Fax: [phone]

## 2016-11-14 NOTE — Progress Notes (Signed)
HPI: FU coronary artery disease and atrial fibrillation. Last cardiac catheterization on Mar 06, 2007 showed an ejection fraction of 55%. There is nonobstructive plaque in the LAD, and circumflex and the right coronary artery was normal. 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 Also note the patient is extremely sensitive to Coumadin. MRA of the abdomen in September of 2012 showed no left renal artery stenosis and previous right nephrectomy. 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 Echocardiogram in June of 2014 showed normal LV function, grade 1 diastolic dysfunction and mild left atrial enlargement. Carotid Dopplers in March 2017 showed 1-39% stenosis bilaterally. Event monitor January 2018 showed sinus with PACs, PVCs and ectopic atrial tachycardia. Patient seen in the office January 22 with recurrent atrial fibrillation. Cardioversion was scheduled but she converted to sinus rhythm on her own. Since I last saw her, she had a brief dizzy spell but no syncope. She has some dyspnea on exertion but no orthopnea, PND, pedal edema, chest pain, bleeding. No recurrent atrial fibrillation since she converted spontaneously.  Current Outpatient Prescriptions  Medication Sig Dispense Refill  . acetaminophen (TYLENOL) 325 MG tablet Take 650 mg by mouth every 6 (six) hours as needed.    Marland Kitchen amLODipine (NORVASC) 10 MG tablet TAKE 1 TABLET (10 MG TOTAL) BY MOUTH DAILY. 90 tablet 3  . dofetilide (TIKOSYN) 125 MCG capsule TAKE 3 CAPSULES (375 MCG TOTAL) BY MOUTH 2 (TWO) TIMES DAILY. 180 capsule 2  . escitalopram (LEXAPRO) 10 MG tablet TAKE 1 TABLET (10 MG TOTAL) BY MOUTH DAILY.  5  . hydrocortisone (ANUSOL-HC) 25 MG suppository Place 1 suppository (25 mg total) rectally at bedtime as needed for hemorrhoids or itching. 12 suppository 2  . InFLIXimab (REMICADE IV) Inject into the vein as  directed.    . loperamide (IMODIUM A-D) 2 MG tablet Take 1 mg by mouth daily as needed. Dihrrhea    . losartan (COZAAR) 100 MG tablet TAKE 1 TABLET (100 MG TOTAL) BY MOUTH DAILY. 90 tablet 1  . metoprolol tartrate (LOPRESSOR) 25 MG tablet Take 0.5 tablets (12.5 mg total) by mouth 2 (two) times daily. 90 tablet 3  . Multiple Vitamin (MULTIVITAMIN) capsule Take 1 capsule by mouth daily. 1 daily      . nitroGLYCERIN (NITROSTAT) 0.4 MG SL tablet Place 1 tablet (0.4 mg total) under the tongue every 5 (five) minutes as needed. 1 tab under tongue every 5 min for chest pain not to exceed 3 tabs in 15 min 25 tablet 6  . Probiotic Product ([place]) CAPS Take 1 capsule by mouth daily. 1 per day    . Vitamin D, Cholecalciferol, 1000 units TABS Take by mouth daily.    Marland Kitchen warfarin (COUMADIN) 2.5 MG tablet Take 1 tablet (2.5 mg total) by mouth one time only at 6 PM. 120 tablet 0   No current facility-administered medications for this visit.      Past Medical History:  Diagnosis Date  . Anxiety   . Arthritis   . CAD (coronary artery disease)   . Crohn's colitis ([place])    she reports ulcerative colitis beginning in her 58's, biopsies 2008 suggest Crohn's not UC  . Depression   . GERD (gastroesophageal reflux disease)   . Hyperlipidemia   . Hypertension   . Keloid of skin   . Obesity   . OSA (obstructive sleep  apnea)   . Pancreatitis   . Paroxysmal atrial fibrillation ([facility])   . 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 Renal oncocytoma    s/p right nephrectomy    Past Surgical History:  Procedure Laterality Date  . BREAST BIOPSY  1996   benign lesion   . Cataract surgery Left 11/13/14  . CHOLECYSTECTOMY  10/2006  . COLONOSCOPY W/ BIOPSIES    . CORONARY ANGIOPLASTY    . ESOPHAGOGASTRODUODENOSCOPY    . NEPHRECTOMY  10/2006   right secondary to oncocytoma   . RIGHT FEMORAL  PSEUDOANEURYSM & RIGHT GROIN HEMATOMA EVACUATION  02/2007  . RIGHT RENAL ARTERY REPAIR  1976  . TUBAL  LIGATION      Social History   Social History  . Marital status: Married    Spouse name: N/A  . Number of children: 3  . Years of education: N/A   Occupational History  . Retired Retired    retired   Social History Main Topics  . Smoking status: Never Smoker  . Smokeless tobacco: Never Used  . Alcohol use No  . Drug use: No  . Sexual activity: Not Currently   Other Topics Concern  . 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 Colitis Father   . Heart disease Father   . Hypertension Mother   . Coronary artery disease Mother   . Stroke Mother   . Clotting disorder Mother   . Heart disease Mother   . Allergies Mother   . Stroke Brother   . Colon cancer Paternal Aunt   . Clotting disorder Brother   . Clotting disorder Sister   . Asthma Daughter   . Asthma Maternal Uncle   . Colon polyps Neg Hx   . Kidney disease Neg Hx     ROS: no fevers or chills, productive cough, hemoptysis, dysphasia, odynophagia, melena, hematochezia, dysuria, hematuria, rash, seizure activity, orthopnea, PND, pedal edema, claudication. Remaining systems are negative.  Physical Exam: Well-developed obese in no acute distress.  Skin is warm and dry.  HEENT is normal.  Neck is supple. No murmurs Chest is clear to auscultation with normal expansion.  Cardiovascular exam is regular rate and rhythm.  Abdominal exam nontender or distended. No masses palpated. Extremities show trace edema. neuro grossly intact  ECG-Sinus bradycardia at a rate of 51. No ST changes.  A/P  1 Patient remains in sinus rhythm. Continue tikosyn. Continue Coumadin. Her episodes are relatively infrequent and I therefore think that continuing tikosyn is the right option. She is extremely symptomatic with her atrial fibrillation.  2  carotid artery disease-patient is intolerant to statins. No aspirin given need for Coumadin.  3 coronary artery disease-intolerant to statins. No aspirin given need for Coumadin.   4 hypertension-blood pressure elevated but she states typically controlled at home. Continue present medications and follow.  5 hyperlipidemia-continue diet.    Kirk Ruths, MD

## 2016-11-15 ENCOUNTER — Telehealth: Payer: Self-pay | Admitting: Cardiology

## 2016-11-15 LAB — URINALYSIS W MICROSCOPIC + REFLEX CULTURE
Bilirubin Urine: NEGATIVE
Crystals: NONE SEEN [HPF]
Glucose, UA: NEGATIVE
HGB URINE DIPSTICK: NEGATIVE
Nitrite: NEGATIVE
PH: 6 (ref 5.0–8.0)
SPECIFIC GRAVITY, URINE: 1.021 (ref 1.001–1.035)
YEAST: NONE SEEN [HPF]

## 2016-11-15 LAB — BASIC METABOLIC PANEL
BUN: 20 mg/dL (ref 7–25)
CALCIUM: 9.5 mg/dL (ref 8.6–10.4)
CHLORIDE: 104 mmol/L (ref 98–110)
CO2: 23 mmol/L (ref 20–31)
Creat: 1.25 mg/dL — ABNORMAL HIGH (ref 0.60–0.93)
Glucose, Bld: 96 mg/dL (ref 65–99)
Potassium: 4.6 mmol/L (ref 3.5–5.3)
SODIUM: 140 mmol/L (ref 135–146)

## 2016-11-15 LAB — MAGNESIUM: Magnesium: 2.1 mg/dL (ref 1.5–2.5)

## 2016-11-15 NOTE — Telephone Encounter (Signed)
Returned call to patient Dr.Crenshaw's recommendations given.Cardioversion for 11/19/15 cancelled spoke to [place].

## 2016-11-15 NOTE — Telephone Encounter (Signed)
Cancel DCCV; ok to wait until next ov to repeat ECG if she feels well Kirk Ruths

## 2016-11-15 NOTE — Telephone Encounter (Signed)
Returned call to patient she stated she is back in regular rhythm today.Stated she feels better.She wanted to cancel cardioversion scheduled this Fri 11/18/16.She wanted to know if she needs to have ekg done to confirm.Message sent to [place].

## 2016-11-15 NOTE — Telephone Encounter (Signed)
Pt saw Cecilie Kicks yesterday for Atrial Fib. She wanted Dr Stanford Breed to know that today she is back in rhythm.

## 2016-11-16 ENCOUNTER — Telehealth: Payer: Self-pay | Admitting: Cardiology

## 2016-11-16 NOTE — Telephone Encounter (Signed)
New Message     They cant run urine sample because it was processed in wrong container, please recollect urine and submit in container with grey cap.  Thank you

## 2016-11-16 NOTE — Telephone Encounter (Signed)
Returned the call to [employer].  They will call the pt to have her come in and they can recollect it, since their site messed it up.

## 2016-11-17 ENCOUNTER — Other Ambulatory Visit: Payer: Self-pay | Admitting: Cardiology

## 2016-11-17 DIAGNOSIS — I480 Paroxysmal atrial fibrillation: Secondary | ICD-10-CM | POA: Diagnosis not present

## 2016-11-17 DIAGNOSIS — R350 Frequency of micturition: Secondary | ICD-10-CM | POA: Diagnosis not present

## 2016-11-17 DIAGNOSIS — Z01818 Encounter for other preprocedural examination: Secondary | ICD-10-CM | POA: Diagnosis not present

## 2016-11-18 ENCOUNTER — Ambulatory Visit (HOSPITAL_COMMUNITY): Admission: RE | Admit: 2016-11-18 | Payer: Medicare Other | Source: Ambulatory Visit | Admitting: Cardiovascular Disease

## 2016-11-18 ENCOUNTER — Encounter (HOSPITAL_COMMUNITY): Admission: RE | Payer: Self-pay | Source: Ambulatory Visit

## 2016-11-18 SURGERY — CARDIOVERSION
Anesthesia: Monitor Anesthesia Care

## 2016-11-19 LAB — URINE CULTURE

## 2016-11-23 ENCOUNTER — Encounter: Payer: Self-pay | Admitting: Cardiology

## 2016-11-23 ENCOUNTER — Other Ambulatory Visit: Payer: Self-pay | Admitting: Family Medicine

## 2016-11-23 ENCOUNTER — Other Ambulatory Visit: Payer: Self-pay | Admitting: Cardiology

## 2016-11-23 ENCOUNTER — Ambulatory Visit (INDEPENDENT_AMBULATORY_CARE_PROVIDER_SITE_OTHER): Payer: Medicare Other | Admitting: Cardiology

## 2016-11-23 VITALS — BP 147/80 | HR 51 | Ht 63.0 in | Wt 234.1 lb

## 2016-11-23 DIAGNOSIS — I480 Paroxysmal atrial fibrillation: Secondary | ICD-10-CM

## 2016-11-23 DIAGNOSIS — I4891 Unspecified atrial fibrillation: Secondary | ICD-10-CM

## 2016-11-23 DIAGNOSIS — I10 Essential (primary) hypertension: Secondary | ICD-10-CM

## 2016-11-23 DIAGNOSIS — I2510 Atherosclerotic heart disease of native coronary artery without angina pectoris: Secondary | ICD-10-CM | POA: Diagnosis not present

## 2016-11-23 NOTE — Patient Instructions (Signed)
Your physician wants you to follow-up in: 6 MONTHS WITH DR CRENSHAW You will receive a reminder letter in the mail two months in advance. If you don't receive a letter, please call our office to schedule the follow-up appointment.   If you need a refill on your cardiac medications before your next appointment, please call your pharmacy.  

## 2016-11-23 NOTE — Telephone Encounter (Signed)
REFILL 

## 2016-11-28 ENCOUNTER — Ambulatory Visit: Payer: Medicare Other | Admitting: Family Medicine

## 2016-11-28 ENCOUNTER — Ambulatory Visit (INDEPENDENT_AMBULATORY_CARE_PROVIDER_SITE_OTHER): Payer: Medicare Other | Admitting: Family Medicine

## 2016-11-28 VITALS — BP 120/64 | HR 53 | Temp 98.0°F | Wt 235.8 lb

## 2016-11-28 DIAGNOSIS — R739 Hyperglycemia, unspecified: Secondary | ICD-10-CM

## 2016-11-28 DIAGNOSIS — M810 Age-related osteoporosis without current pathological fracture: Secondary | ICD-10-CM

## 2016-11-28 DIAGNOSIS — E6609 Other obesity due to excess calories: Secondary | ICD-10-CM | POA: Diagnosis not present

## 2016-11-28 DIAGNOSIS — H9193 Unspecified hearing loss, bilateral: Secondary | ICD-10-CM | POA: Diagnosis not present

## 2016-11-28 DIAGNOSIS — I10 Essential (primary) hypertension: Secondary | ICD-10-CM | POA: Diagnosis not present

## 2016-11-28 DIAGNOSIS — I2510 Atherosclerotic heart disease of native coronary artery without angina pectoris: Secondary | ICD-10-CM | POA: Diagnosis not present

## 2016-11-28 DIAGNOSIS — I480 Paroxysmal atrial fibrillation: Secondary | ICD-10-CM

## 2016-11-28 DIAGNOSIS — E782 Mixed hyperlipidemia: Secondary | ICD-10-CM

## 2016-11-28 DIAGNOSIS — H9190 Unspecified hearing loss, unspecified ear: Secondary | ICD-10-CM | POA: Diagnosis not present

## 2016-11-28 NOTE — Assessment & Plan Note (Addendum)
2000 IU in vitamins daily. Encouraged to get adequate exercise, calcium and vitamin d intake

## 2016-11-28 NOTE — Progress Notes (Signed)
Pre visit review using our clinic review tool, if applicable. No additional management support is needed unless otherwise documented below in the visit note. 

## 2016-11-28 NOTE — Patient Instructions (Signed)
Hypertension Hypertension, commonly called high blood pressure, is when the force of blood pumping through your arteries is too strong. Your arteries are the blood vessels that carry blood from your heart throughout your body. A blood pressure reading consists of a higher number over a lower number, such as 110/72. The higher number (systolic) is the pressure inside your arteries when your heart pumps. The lower number (diastolic) is the pressure inside your arteries when your heart relaxes. Ideally you want your blood pressure below 120/80. Hypertension forces your heart to work harder to pump blood. Your arteries may become narrow or stiff. Having untreated or uncontrolled hypertension can cause heart attack, stroke, kidney disease, and other problems. What increases the risk? Some risk factors for high blood pressure are controllable. Others are not. Risk factors you cannot control include:  Race. You may be at higher risk if you are African American.  Age. Risk increases with age.  Gender. Men are at higher risk than women before age 45 years. After age 65, women are at higher risk than men. Risk factors you can control include:  Not getting enough exercise or physical activity.  Being overweight.  Getting too much fat, sugar, calories, or salt in your diet.  Drinking too much alcohol. What are the signs or symptoms? Hypertension does not usually cause signs or symptoms. Extremely high blood pressure (hypertensive crisis) may cause headache, anxiety, shortness of breath, and nosebleed. How is this diagnosed? To check if you have hypertension, your health care provider will measure your blood pressure while you are seated, with your arm held at the level of your heart. It should be measured at least twice using the same arm. Certain conditions can cause a difference in blood pressure between your right and left arms. A blood pressure reading that is higher than normal on one occasion does  not mean that you need treatment. If it is not clear whether you have high blood pressure, you may be asked to return on a different day to have your blood pressure checked again. Or, you may be asked to monitor your blood pressure at home for 1 or more weeks. How is this treated? Treating high blood pressure includes making lifestyle changes and possibly taking medicine. Living a healthy lifestyle can help lower high blood pressure. You may need to change some of your habits. Lifestyle changes may include:  Following the DASH diet. This diet is high in fruits, vegetables, and whole grains. It is low in salt, red meat, and added sugars.  Keep your sodium intake below 2,300 mg per day.  Getting at least 30-45 minutes of aerobic exercise at least 4 times per week.  Losing weight if necessary.  Not smoking.  Limiting alcoholic beverages.  Learning ways to reduce stress. Your health care provider may prescribe medicine if lifestyle changes are not enough to get your blood pressure under control, and if one of the following is true:  You are 18-59 years of age and your systolic blood pressure is above 140.  You are 60 years of age or older, and your systolic blood pressure is above 150.  Your diastolic blood pressure is above 90.  You have diabetes, and your systolic blood pressure is over 140 or your diastolic blood pressure is over 90.  You have kidney disease and your blood pressure is above 140/90.  You have heart disease and your blood pressure is above 140/90. Your personal target blood pressure may vary depending on your medical   conditions, your age, and other factors. Follow these instructions at home:  Have your blood pressure rechecked as directed by your health care provider.  Take medicines only as directed by your health care provider. Follow the directions carefully. Blood pressure medicines must be taken as prescribed. The medicine does not work as well when you skip  doses. Skipping doses also puts you at risk for problems.  Do not smoke.  Monitor your blood pressure at home as directed by your health care provider. Contact a health care provider if:  You think you are having a reaction to medicines taken.  You have recurrent headaches or feel dizzy.  You have swelling in your ankles.  You have trouble with your vision. Get help right away if:  You develop a severe headache or confusion.  You have unusual weakness, numbness, or feel faint.  You have severe chest or abdominal pain.  You vomit repeatedly.  You have trouble breathing. This information is not intended to replace advice given to you by your health care provider. Make sure you discuss any questions you have with your health care provider. Document Released: 10/10/2005 Document Revised: 03/17/2016 Document Reviewed: 08/02/2013 Elsevier Interactive Patient Education  2017 [employer]  

## 2016-11-28 NOTE — Progress Notes (Signed)
Patient ID: LETTICIA TUZZOLINO, female   DOB: 09/08/1946, 71 y.o.   MRN: [id number]   Subjective:    Patient ID: Rosemary Holms, female    DOB: [birth year], 70 y.o.   MRN: [id number]  Chief Complaint  Patient presents with  . Follow-up  . Hypertension  . Hyperglycemia  . Hearing Loss   I acted as a Education administrator for Dr. Charlett Blake. Princess, RMA  Hypertension  This is a chronic problem. The current episode started more than 1 year ago. The problem is unchanged. The problem is controlled. Pertinent negatives include no blurred vision, chest pain, headaches, malaise/fatigue, palpitations or shortness of breath. The current treatment provides mild improvement.  Hyperglycemia  Pertinent negatives include no chest pain, congestion, coughing, fever, headaches, rash or vomiting.    Patient is in today for follow up on hypertension and hyperglycemia. Patient complains of bilateral hearing loss referral sent to Dr. Freddi Che, audiologist. No recent trauma or fever or head congestion. She had an episode of atrial fibrillation in January but none since then and she is feeling well today. She also had some dental work done recently on the right upper jaw line. This feels better as well.Denies CP/palp/SOB/HA/congestion/fevers/GI or GU c/o. Taking meds as prescribed  Past Medical History:  Diagnosis Date  . Anxiety   . Arthritis   . CAD (coronary artery disease)   . Crohn's colitis ([place])    she reports ulcerative colitis beginning in her 65's, biopsies 2008 suggest Crohn's not UC  . Depression   . GERD (gastroesophageal reflux disease)   . Hyperlipidemia   . Hypertension   . Keloid of skin   . Obesity   . OSA (obstructive sleep apnea)   . Pancreatitis   . Paroxysmal atrial fibrillation (H[facility])   . 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 Renal oncocytoma    s/p right nephrectomy    Past Surgical History:  Procedure Laterality Date  . BREAST BIOPSY  1996   benign lesion   . Cataract  surgery Left 11/13/14  . CHOLECYSTECTOMY  10/2006  . COLONOSCOPY W/ BIOPSIES    . CORONARY ANGIOPLASTY    . ESOPHAGOGASTRODUODENOSCOPY    . NEPHRECTOMY  10/2006   right secondary to oncocytoma   . RIGHT FEMORAL  PSEUDOANEURYSM & RIGHT GROIN HEMATOMA EVACUATION  02/2007  . RIGHT RENAL ARTERY REPAIR  1976  . TUBAL LIGATION      Family History  Problem Relation Age of Onset  . Colitis Father   . Heart disease Father   . Hypertension Mother   . Coronary artery disease Mother   . Stroke Mother   . Clotting disorder Mother   . Heart disease Mother   . Allergies Mother   . Stroke Brother   . Colon cancer Paternal Aunt   . Clotting disorder Brother   . Clotting disorder Sister   . Asthma Daughter   . Asthma Maternal Uncle   . Colon polyps Neg Hx   . Kidney disease Neg Hx     Social History   Social History  . Marital status: Married    Spouse name: N/A  . Number of children: 3  . Years of education: N/A   Occupational History  . Retired Retired    retired   Social History Main Topics  . Smoking status: Never Smoker  . Smokeless tobacco: Never Used  . Alcohol use No  . Drug use: No  . Sexual activity: Not Currently   Other  Topics Concern  . 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 acetaminophen (TYLENOL) 325 MG tablet Take 650 mg by mouth every 6 (six) hours as needed.    Marland Kitchen amLODipine (NORVASC) 10 MG tablet TAKE 1 TABLET (10 MG TOTAL) BY MOUTH DAILY. 90 tablet 3  . dofetilide (TIKOSYN) 125 MCG capsule Take 3 capsules (375 mcg total) by mouth 2 (two) times daily. 180 capsule 2  . escitalopram (LEXAPRO) 10 MG tablet TAKE 1 TABLET (10 MG TOTAL) BY MOUTH DAILY.  5  . hydrocortisone (ANUSOL-HC) 25 MG suppository Place 1 suppository (25 mg total) rectally at bedtime  as needed for hemorrhoids or itching. 12 suppository 2  . InFLIXimab (REMICADE IV) Inject into the vein as directed.    . loperamide (IMODIUM A-D) 2 MG tablet Take 1 mg by mouth daily as needed. Dihrrhea    . losartan (COZAAR) 100 MG tablet TAKE 1 TABLET (100 MG TOTAL) BY MOUTH DAILY. 90 tablet 1  . metoprolol tartrate (LOPRESSOR) 25 MG tablet Take 0.5 tablets (12.5 mg total) by mouth 2 (two) times daily. 90 tablet 3  . Multiple Vitamin (MULTIVITAMIN) capsule Take 1 capsule by mouth daily. 1 daily      . nitroGLYCERIN (NITROSTAT) 0.4 MG SL tablet Place 1 tablet (0.4 mg total) under the tongue every 5 (five) minutes as needed. 1 tab under tongue every 5 min for chest pain not to exceed 3 tabs in 15 min 25 tablet 6  . Probiotic Product ([place]) CAPS Take 1 capsule by mouth daily. 1 per day    . Vitamin D, Cholecalciferol, 1000 units TABS Take by mouth daily.    Marland Kitchen warfarin (COUMADIN) 2.5 MG tablet TAKE 1-1.5 TABLETS BY MOUTH DAILY AS DIRECTED BY COUMADIN CLINIC 120 tablet 0   No facility-administered medications prior to visit.     Allergies  Allergen Reactions  . Cefdinir     Pt cannot take; may interfere with AFib.  . Codeine     Heart beats fast   . Guaifenesin Er Nausea And Vomiting and Other (See Comments)    Headaches; can tolerate liquid.  . Latex     Breathing problems  . Mucilgen [Psyllium]   . Percocet [Oxycodone-Acetaminophen]     Itching & swelling in jaw    Review of Systems  Constitutional: Negative for fever and malaise/fatigue.  HENT: Negative for congestion.   Eyes: Negative for blurred vision.  Respiratory: Negative for cough and shortness of breath.   Cardiovascular: Negative for chest pain, palpitations and leg swelling.  Gastrointestinal: Negative for vomiting.  Musculoskeletal: Negative for back pain.  Skin: Negative for rash.  Neurological: Negative for loss of consciousness and headaches.       Objective:    Physical Exam    Constitutional: She is oriented to person, place, and time. She appears well-developed and well-nourished. No distress.  HENT:  Head: Normocephalic and atraumatic.  Eyes: Conjunctivae are normal.  Neck: Normal range of motion. No thyromegaly present.  Cardiovascular: Regular rhythm.   Pulmonary/Chest: Effort normal and breath sounds normal. She has no wheezes.  Abdominal: Soft. Bowel sounds are normal. There is no tenderness.  Musculoskeletal: Normal range of motion. She exhibits no edema or deformity.  Neurological: She is  alert and oriented to person, place, and time.  Skin: Skin is warm and dry. She is not diaphoretic.  Psychiatric: She has a normal mood and affect.    BP 120/64 (BP Location: Left Arm, Patient Position: Sitting, Cuff Size: Normal)   Pulse (!) 53   Temp 98 F (36.7 C) (Oral)   Wt 235 lb 12.8 oz (107 kg)   SpO2 97%   BMI 41.77 kg/m  Wt Readings from Last 3 Encounters:  11/28/16 235 lb 12.8 oz (107 kg)  11/23/16 234 lb 1.9 oz (106.2 kg)  11/14/16 230 lb 12.8 oz (104.7 kg)     Lab Results  Component Value Date   WBC 9.7 11/14/2016   HGB 15.3 11/14/2016   HCT 45.9 (H) 11/14/2016   PLT 255 11/14/2016   GLUCOSE 96 11/14/2016   CHOL 172 06/20/2016   TRIG 136.0 06/20/2016   HDL 45.80 06/20/2016   LDLCALC 99 06/20/2016   ALT 19 06/20/2016   AST 23 06/20/2016   NA 140 11/14/2016   K 4.6 11/14/2016   CL 104 11/14/2016   CREATININE 1.25 (H) 11/14/2016   BUN 20 11/14/2016   CO2 23 11/14/2016   TSH 2.79 11/14/2016   INR 2.3 11/14/2016   HGBA1C 5.4 06/20/2016    Lab Results  Component Value Date   TSH 2.79 11/14/2016   Lab Results  Component Value Date   WBC 9.7 11/14/2016   HGB 15.3 11/14/2016   HCT 45.9 (H) 11/14/2016   MCV 94.3 11/14/2016   PLT 255 11/14/2016   Lab Results  Component Value Date   NA 140 11/14/2016   K 4.6 11/14/2016   CO2 23 11/14/2016   GLUCOSE 96 11/14/2016   BUN 20 11/14/2016   CREATININE 1.25 (H) 11/14/2016    BILITOT 0.6 06/20/2016   ALKPHOS 70 06/20/2016   AST 23 06/20/2016   ALT 19 06/20/2016   PROT 8.0 06/20/2016   ALBUMIN 4.0 06/20/2016   CALCIUM 9.5 11/14/2016   ANIONGAP 11 11/13/2015   GFR 56.81 (L) 06/20/2016   Lab Results  Component Value Date   CHOL 172 06/20/2016   Lab Results  Component Value Date   HDL 45.80 06/20/2016   Lab Results  Component Value Date   LDLCALC 99 06/20/2016   Lab Results  Component Value Date   TRIG 136.0 06/20/2016   Lab Results  Component Value Date   CHOLHDL 4 06/20/2016   Lab Results  Component Value Date   HGBA1C 5.4 06/20/2016       Assessment & Plan:   Problem List Items Addressed This Visit    Hyperlipidemia    Encouraged heart healthy diet, increase exercise, avoid trans fats, consider a krill oil cap daily. Statin intolerant      Obesity    Encouraged DASH diet, decrease po intake and increase exercise as tolerated. Needs 7-8 hours of sleep nightly. Avoid trans fats, eat small, frequent meals every 4-5 hours with lean proteins, complex carbs and healthy fats. Minimize simple carbs. Referred to bariatrics for further consideration.       Hearing loss - Primary    Referred to audiology.       Relevant Orders   Ambulatory referral to Audiology   Essential hypertension    Well controlled, no changes to meds. Encouraged heart healthy diet such as the DASH diet and exercise as tolerated.       ATRIAL FIBRILLATION    Had a recent episode but is feeling well today. Tolerating  her Coumadin daily.       Osteoporosis    2000 IU in vitamins daily. Encouraged to get adequate exercise, calcium and vitamin d intake      Hyperglycemia    minimize simple carbs. Increase exercise as tolerated.          I am having Ms. Robin maintain her loperamide, multivitamin, PHILLIPS COLON HEALTH, InFLIXimab (REMICADE IV), nitroGLYCERIN, acetaminophen, hydrocortisone, losartan, metoprolol tartrate, amLODipine, escitalopram, Vitamin D  (Cholecalciferol), dofetilide, and warfarin.  No orders of the defined types were placed in this encounter.   CMA served as Education administrator during this visit. History, Physical and Plan performed by medical provider. Documentation and orders reviewed and attested to.  Penni Homans, MD

## 2016-12-04 NOTE — Assessment & Plan Note (Signed)
Had a recent episode but is feeling well today. Tolerating her Coumadin daily.

## 2016-12-04 NOTE — Assessment & Plan Note (Signed)
Well controlled, no changes to meds. Encouraged heart healthy diet such as the DASH diet and exercise as tolerated.  °

## 2016-12-04 NOTE — Assessment & Plan Note (Signed)
Referred to audiology

## 2016-12-04 NOTE — Assessment & Plan Note (Signed)
minimize simple carbs. Increase exercise as tolerated.  

## 2016-12-04 NOTE — Assessment & Plan Note (Signed)
Encouraged DASH diet, decrease po intake and increase exercise as tolerated. Needs 7-8 hours of sleep nightly. Avoid trans fats, eat small, frequent meals every 4-5 hours with lean proteins, complex carbs and healthy fats. Minimize simple carbs. Referred to bariatrics for further consideration.

## 2016-12-04 NOTE — Assessment & Plan Note (Signed)
Encouraged heart healthy diet, increase exercise, avoid trans fats, consider a krill oil cap daily. Statin intolerant 

## 2016-12-08 DIAGNOSIS — H903 Sensorineural hearing loss, bilateral: Secondary | ICD-10-CM | POA: Diagnosis not present

## 2016-12-12 ENCOUNTER — Ambulatory Visit (INDEPENDENT_AMBULATORY_CARE_PROVIDER_SITE_OTHER): Payer: Medicare Other | Admitting: Pharmacist Clinician (PhC)/ Clinical Pharmacy Specialist

## 2016-12-12 DIAGNOSIS — Z5181 Encounter for therapeutic drug level monitoring: Secondary | ICD-10-CM

## 2016-12-12 DIAGNOSIS — I480 Paroxysmal atrial fibrillation: Secondary | ICD-10-CM

## 2016-12-12 DIAGNOSIS — I2510 Atherosclerotic heart disease of native coronary artery without angina pectoris: Secondary | ICD-10-CM

## 2016-12-12 LAB — POCT INR: INR: 2.4

## 2016-12-14 ENCOUNTER — Telehealth: Payer: Self-pay | Admitting: Cardiology

## 2016-12-14 DIAGNOSIS — Z79899 Other long term (current) drug therapy: Secondary | ICD-10-CM

## 2016-12-14 MED ORDER — HYDRALAZINE HCL 25 MG PO TABS: 25.0000 mg | ORAL_TABLET | Freq: Three times a day (TID) | ORAL | 3 refills | Status: DC

## 2016-12-14 NOTE — Telephone Encounter (Signed)
Returned call to patient:  Patient reports blood pressure has been consistently running high since last OV with [name] 1/31   BP Readings:  AM:  PM: 2/12 160/91   2/13 158/90  153/85 2/16 152/87   2/18 160/88  159/89 2/19 160/86   2/20 162/90  169/86  Reports HR 53-61  Reports a mild HA to back of her head, denies dizziness, lightheadedness, blurry vision, CP, SOB.  Patient does report one episode one morning when she sat up in bed and fell over to her right side x5 due to dizziness (possible change in position?), denies LOC.  Patient reports current medications: Metoprolol tartrate 50mg  BID (listed as 12.5mg  BID on med list) -patient reports taking 1/2 tablet 4x a day to equal full total dose? States she spaces it out throughout the day Losartan 100mg  q day Amlodipine 10mg  q day  Advised I would route to MD for recommendations.  Pt verbalized understanding.

## 2016-12-14 NOTE — Telephone Encounter (Signed)
Returned call to patient to give [name] recommendations.  Pt reports she has been told in the past she cannot take HCTZ with Tikosyn.  Advised patient I would verify.  Spoke with pharmacist-there is concern for QT prolongation, need to verify with MD.  Routed to MD to verify addition of medication.

## 2016-12-14 NOTE — Telephone Encounter (Signed)
Returned call to patient-made aware of recommendations.  Rx sent to pharmacy, aware to continue to monitor BP.    Patient verbalized understanding.

## 2016-12-14 NOTE — Telephone Encounter (Signed)
Patient calling states that at the last visit her BP was elevated and she has been taking medication as prescribed by [name] but BP is still elevated. She would like to know what she should do about the high Bp? Please call, thanks.

## 2016-12-14 NOTE — Telephone Encounter (Signed)
Add hydralazine 25 mg TID and follow BP; no BMET Kirk Ruths

## 2016-12-14 NOTE — Telephone Encounter (Signed)
Add hctz 12. 5 mg daily; bmet one week; follow BP Kirk Ruths

## 2016-12-19 ENCOUNTER — Telehealth: Payer: Self-pay | Admitting: Cardiology

## 2016-12-19 MED ORDER — HYDRALAZINE HCL 50 MG PO TABS: 50.0000 mg | ORAL_TABLET | Freq: Three times a day (TID) | ORAL | 3 refills | Status: DC

## 2016-12-19 NOTE — Telephone Encounter (Signed)
Change hydralazine to 50 mg tid Kirk Ruths

## 2016-12-19 NOTE — Telephone Encounter (Signed)
Spoke with pt states that she started hydralizine 25mg  TID on wed-12-14-16 and states that she does not think that it is helping with he Bp. Her BP has been running: Sat-150/96 HR-61 Sun-163/97 HR-50 pm-171/97 HR-59 Mon-172/96 HR-79.  Pt denies headache,chest pain or pressure, shortness of breath, edema, no lightheaded or dizziness.  What should we do next, please advise?

## 2016-12-19 NOTE — Telephone Encounter (Signed)
New message   Pt is calling saying she was prescribed new medication last week for her Bp.   Pt c/o BP issue: STAT if pt c/o blurred vision, one-sided weakness or slurred speech  1. What are your last 5 BP readings? Sat-150/96 HR-61 Sun-163/97 HR-50 pm-171/97 HR-59 Mon-172/96 HR-79  2. Are you having any other symptoms (ex. Dizziness, headache, blurred vision, passed out)? Pt states she is having sinus headaches and doesn't know if it could be that.   3. What is your BP issue? Blood pressure is still running high.

## 2016-12-19 NOTE — Telephone Encounter (Signed)
Pt notified, sent new rx to [facility], she will start now

## 2016-12-20 ENCOUNTER — Telehealth: Payer: Self-pay | Admitting: Family Medicine

## 2016-12-20 ENCOUNTER — Ambulatory Visit (INDEPENDENT_AMBULATORY_CARE_PROVIDER_SITE_OTHER): Payer: Medicare Other | Admitting: Family Medicine

## 2016-12-20 ENCOUNTER — Encounter: Payer: Self-pay | Admitting: Family Medicine

## 2016-12-20 VITALS — BP 132/62 | HR 56 | Temp 97.6°F | Resp 17 | Ht 65.0 in | Wt 238.0 lb

## 2016-12-20 DIAGNOSIS — H25811 Combined forms of age-related cataract, right eye: Secondary | ICD-10-CM | POA: Diagnosis not present

## 2016-12-20 DIAGNOSIS — I2510 Atherosclerotic heart disease of native coronary artery without angina pectoris: Secondary | ICD-10-CM | POA: Diagnosis not present

## 2016-12-20 DIAGNOSIS — H26492 Other secondary cataract, left eye: Secondary | ICD-10-CM | POA: Diagnosis not present

## 2016-12-20 DIAGNOSIS — I10 Essential (primary) hypertension: Secondary | ICD-10-CM | POA: Diagnosis not present

## 2016-12-20 DIAGNOSIS — J324 Chronic pansinusitis: Secondary | ICD-10-CM | POA: Diagnosis not present

## 2016-12-20 DIAGNOSIS — H35342 Macular cyst, hole, or pseudohole, left eye: Secondary | ICD-10-CM | POA: Diagnosis not present

## 2016-12-20 DIAGNOSIS — H524 Presbyopia: Secondary | ICD-10-CM | POA: Diagnosis not present

## 2016-12-20 MED ORDER — FLUTICASONE PROPIONATE 50 MCG/ACT NA SUSP: 2.0000 | Freq: Every day | NASAL | 6 refills | Status: DC

## 2016-12-20 MED ORDER — DOXYCYCLINE HYCLATE 100 MG PO TABS: 100.0000 mg | ORAL_TABLET | Freq: Two times a day (BID) | ORAL | 0 refills | Status: DC

## 2016-12-20 NOTE — Progress Notes (Signed)
`Patient ID: [name], female    DOB: Oct 03, 1946  Age: 71 y.o. MRN: [id number]    Subjective:  Subjective  HPI [name] presents for bp check and c/o sinus pressure.   She has been working with cardiology for her bp and it seems better today. Sinus pressure and few months.  She is using saline rinse only.  No fever.  No cough.    Review of Systems  Constitutional: Negative for chills and fever.  HENT: Positive for congestion, postnasal drip, sinus pain and sinus pressure. Negative for rhinorrhea.   Respiratory: Negative for cough, chest tightness, shortness of breath and wheezing.   Cardiovascular: Negative for chest pain, palpitations and leg swelling.  Allergic/Immunologic: Negative for environmental allergies.    History Past Medical History:  Diagnosis Date  . Anxiety   . Arthritis   . CAD (coronary artery disease)   . Crohn's colitis ([place])    she reports ulcerative colitis beginning in her 62's, biopsies 2008 suggest Crohn's not UC  . Depression   . GERD (gastroesophageal reflux disease)   . Hyperlipidemia   . Hypertension   . Keloid of skin   . Obesity   . OSA (obstructive sleep apnea)   . Pancreatitis   . Paroxysmal atrial fibrillation ([facility])   . 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 Renal oncocytoma    s/p right nephrectomy    She has a past surgical history that includes Nephrectomy (10/2006); Breast biopsy (1996); Cholecystectomy (10/2006); Tubal ligation; RIGHT RENAL ARTERY REPAIR (1976); RIGHT FEMORAL  PSEUDOANEURYSM & RIGHT GROIN HEMATOMA EVACUATION (02/2007); Colonoscopy w/ biopsies; Esophagogastroduodenoscopy; Coronary angioplasty; and Cataract surgery (Left, 11/13/14).   Her family history includes Allergies in her mother; Asthma in her daughter and maternal uncle; Clotting disorder in her brother, mother, and sister; Colitis in her father; Colon cancer in her paternal aunt; Coronary artery disease in her mother; Heart disease in her  father and mother; Hypertension in her mother; Stroke in her brother and mother.She reports that she has never smoked. She has never used smokeless tobacco. She reports that she does not drink alcohol or use drugs.  Current Outpatient Prescriptions on File Prior to Visit  Medication Sig Dispense Refill  . acetaminophen (TYLENOL) 325 MG tablet Take 650 mg by mouth every 6 (six) hours as needed.    Marland Kitchen amLODipine (NORVASC) 10 MG tablet TAKE 1 TABLET (10 MG TOTAL) BY MOUTH DAILY. 90 tablet 3  . dofetilide (TIKOSYN) 125 MCG capsule Take 3 capsules (375 mcg total) by mouth 2 (two) times daily. 180 capsule 2  . escitalopram (LEXAPRO) 10 MG tablet TAKE 1 TABLET (10 MG TOTAL) BY MOUTH DAILY.  5  . hydrALAZINE (APRESOLINE) 50 MG tablet Take 1 tablet (50 mg total) by mouth 3 (three) times daily. 60 tablet 3  . hydrocortisone (ANUSOL-HC) 25 MG suppository Place 1 suppository (25 mg total) rectally at bedtime as needed for hemorrhoids or itching. 12 suppository 2  . InFLIXimab (REMICADE IV) Inject into the vein as directed.    . loperamide (IMODIUM A-D) 2 MG tablet Take 1 mg by mouth daily as needed. Dihrrhea    . losartan (COZAAR) 100 MG tablet TAKE 1 TABLET (100 MG TOTAL) BY MOUTH DAILY. 90 tablet 1  . metoprolol tartrate (LOPRESSOR) 25 MG tablet Take 0.5 tablets (12.5 mg total) by mouth 2 (two) times daily. 90 tablet 3  . Multiple Vitamin (MULTIVITAMIN) capsule Take 1 capsule by mouth daily. 1 daily      .  nitroGLYCERIN (NITROSTAT) 0.4 MG SL tablet Place 1 tablet (0.4 mg total) under the tongue every 5 (five) minutes as needed. 1 tab under tongue every 5 min for chest pain not to exceed 3 tabs in 15 min 25 tablet 6  . Probiotic Product ([place]) CAPS Take 1 capsule by mouth daily. 1 per day    . Vitamin D, Cholecalciferol, 1000 units TABS Take by mouth daily.    Marland Kitchen warfarin (COUMADIN) 2.5 MG tablet TAKE 1-1.5 TABLETS BY MOUTH DAILY AS DIRECTED BY COUMADIN CLINIC 120 tablet 0   No current  facility-administered medications on file prior to visit.      Objective:  Objective  Physical Exam  Constitutional: She is oriented to person, place, and time. She appears well-nourished.  HENT:  Nose: No mucosal edema, rhinorrhea, sinus tenderness or nasal deformity. Right sinus exhibits maxillary sinus tenderness and frontal sinus tenderness. Left sinus exhibits maxillary sinus tenderness and frontal sinus tenderness.  Mouth/Throat: Oropharynx is clear and moist and mucous membranes are normal. No oropharyngeal exudate.  Neck: Normal range of motion. Neck supple.  Cardiovascular: Normal rate, regular rhythm and normal heart sounds.   No murmur heard. Pulmonary/Chest: Effort normal and breath sounds normal. No respiratory distress. She has no rales.  Lymphadenopathy:    She has no cervical adenopathy.  Neurological: She is alert and oriented to person, place, and time.  Skin: Skin is warm. She is not diaphoretic.  Psychiatric: She has a normal mood and affect.  Nursing note and vitals reviewed.  BP 132/62 (BP Location: Left Arm, Patient Position: Sitting, Cuff Size: Large)   Pulse (!) 56   Temp 97.6 F (36.4 C) (Oral)   Resp 17   Ht 5\' 5"  (1.651 m)   Wt 238 lb (108 kg)   SpO2 96%   BMI 39.61 kg/m  Wt Readings from Last 3 Encounters:  12/20/16 238 lb (108 kg)  11/28/16 235 lb 12.8 oz (107 kg)  11/23/16 234 lb 1.9 oz (106.2 kg)     Lab Results  Component Value Date   WBC 9.7 11/14/2016   HGB 15.3 11/14/2016   HCT 45.9 (H) 11/14/2016   PLT 255 11/14/2016   GLUCOSE 96 11/14/2016   CHOL 172 06/20/2016   TRIG 136.0 06/20/2016   HDL 45.80 06/20/2016   LDLCALC 99 06/20/2016   ALT 19 06/20/2016   AST 23 06/20/2016   NA 140 11/14/2016   K 4.6 11/14/2016   CL 104 11/14/2016   CREATININE 1.25 (H) 11/14/2016   BUN 20 11/14/2016   CO2 23 11/14/2016   TSH 2.79 11/14/2016   INR 2.4 12/12/2016   HGBA1C 5.4 06/20/2016    Mm Screening Breast Tomo Bilateral  Result Date:  08/25/2016 CLINICAL DATA:  Screening. EXAM: 2D DIGITAL SCREENING BILATERAL MAMMOGRAM WITH CAD AND ADJUNCT TOMO COMPARISON:  Previous exam(s). ACR Breast Density Category b: There are scattered areas of fibroglandular density. FINDINGS: There are no findings suspicious for malignancy. Images were processed with CAD. IMPRESSION: No mammographic evidence of malignancy. A result letter of this screening mammogram will be mailed directly to the patient. RECOMMENDATION: Screening mammogram in one year. (Code:SM-B-01Y) BI-RADS CATEGORY  1: Negative. Electronically Signed   By: Fidela Salisbury M.D.   On: 08/26/2016 16:05     Assessment & Plan:  Plan  I am having Ms. Cirilo start on doxycycline and fluticasone. I am also having her maintain her loperamide, multivitamin, PHILLIPS COLON HEALTH, InFLIXimab (REMICADE IV), nitroGLYCERIN, acetaminophen, hydrocortisone, losartan, metoprolol  tartrate, amLODipine, escitalopram, Vitamin D (Cholecalciferol), dofetilide, warfarin, and hydrALAZINE.  Meds ordered this encounter  Medications  . doxycycline (VIBRA-TABS) 100 MG tablet    Sig: Take 1 tablet (100 mg total) by mouth 2 (two) times daily.    Dispense:  20 tablet    Refill:  0  . fluticasone (FLONASE) 50 MCG/ACT nasal spray    Sig: Place 2 sprays into both nostrils daily.    Dispense:  16 g    Refill:  6    Problem List Items Addressed This Visit      Unprioritized   Sinusitis - Primary   Relevant Medications   doxycycline (VIBRA-TABS) 100 MG tablet   fluticasone (FLONASE) 50 MCG/ACT nasal spray   Essential hypertension    Per cardiology con't with hydralazine 50 mg per cardiology and checking bp          F/u prn  Message left for coumadin clinic to inform them   Follow-up: Return if symptoms worsen or fail to improve.  Ann Held, DO

## 2016-12-20 NOTE — Patient Instructions (Signed)

## 2016-12-20 NOTE — Assessment & Plan Note (Signed)
Per cardiology con't with hydralazine 50 mg per cardiology and checking bp

## 2016-12-20 NOTE — Telephone Encounter (Signed)
-----   Message from Ann Held, [place] sent at 12/20/2016  5:11 PM EST ----- Please  Let pt know -- coumadin clinic wants to see her Friday ----- Message ----- From: Rockne Menghini, RPH-CPP Sent: 12/20/2016   4:27 PM To: Ann Held, DO  Thank you!  We'll see her on Friday to check for any interactions.  Erasmo Downer ----- Message ----- From: Ann Held, DO Sent: 12/20/2016   2:44 PM To: Rockne Menghini, RPH-CPP  Pt was in today and diagnosed with a sinus infection--- we put her on doxycycline-- I just want you to know in case you need to see her sooner for her PT .    [name]

## 2016-12-20 NOTE — Telephone Encounter (Signed)
Called the patient and she has already been notified by coumadin clinic

## 2016-12-20 NOTE — Progress Notes (Signed)
Pre visit review using our clinic review tool, if applicable. No additional management support is needed unless otherwise documented below in the visit note. 

## 2016-12-23 ENCOUNTER — Ambulatory Visit (INDEPENDENT_AMBULATORY_CARE_PROVIDER_SITE_OTHER): Payer: Medicare Other | Admitting: Pharmacist Clinician (PhC)/ Clinical Pharmacy Specialist

## 2016-12-23 DIAGNOSIS — Z5181 Encounter for therapeutic drug level monitoring: Secondary | ICD-10-CM | POA: Diagnosis not present

## 2016-12-23 DIAGNOSIS — I2510 Atherosclerotic heart disease of native coronary artery without angina pectoris: Secondary | ICD-10-CM

## 2016-12-23 DIAGNOSIS — I480 Paroxysmal atrial fibrillation: Secondary | ICD-10-CM | POA: Diagnosis not present

## 2016-12-23 LAB — POCT INR: INR: 1.7

## 2016-12-30 DIAGNOSIS — M0589 Other rheumatoid arthritis with rheumatoid factor of multiple sites: Secondary | ICD-10-CM | POA: Diagnosis not present

## 2017-01-06 ENCOUNTER — Other Ambulatory Visit: Payer: Self-pay | Admitting: Family Medicine

## 2017-01-11 ENCOUNTER — Other Ambulatory Visit: Payer: Self-pay | Admitting: Family Medicine

## 2017-01-19 ENCOUNTER — Ambulatory Visit (INDEPENDENT_AMBULATORY_CARE_PROVIDER_SITE_OTHER): Payer: Medicare Other | Admitting: Adult Health

## 2017-01-19 ENCOUNTER — Encounter: Payer: Self-pay | Admitting: Adult Health

## 2017-01-19 DIAGNOSIS — G4733 Obstructive sleep apnea (adult) (pediatric): Secondary | ICD-10-CM | POA: Diagnosis not present

## 2017-01-19 DIAGNOSIS — I2510 Atherosclerotic heart disease of native coronary artery without angina pectoris: Secondary | ICD-10-CM

## 2017-01-19 DIAGNOSIS — E6609 Other obesity due to excess calories: Secondary | ICD-10-CM | POA: Diagnosis not present

## 2017-01-19 NOTE — Progress Notes (Signed)
@Patient  ID: [name], female    DOB: August 06, 1946, 71 y.o.   MRN: [id number]  Chief Complaint  Patient presents with  . Follow-up    OSA     Referring provider: [name], MD  HPI: 71 yo female with severe OSA on CPAP  Sees dr Amil Amen for RA - on remicaide A Fib on Coumadin   TEST  PSG 2008 >. AHI 36/h Auto 2012 - optimal pr 12 cm , FF mask  01/19/2017 Follow up : OSA  Pt returns for 1 year follow up . She says she is doing very well. Feels she  Could not live without it. Feels rested with no significant daytime sleepiness.  Download shows excellent compliance with AHI at 4.8 . Min leaks. avg usage at 8hr .  She has gotten the So Clean CPAP sanitizer-really likes it.     Allergies  Allergen Reactions  . Cefdinir     Pt cannot take; may interfere with AFib.  . Codeine     Heart beats fast   . Guaifenesin Er Nausea And Vomiting and Other (See Comments)    Headaches; can tolerate liquid.  . Latex     Breathing problems  . Mucilgen [Psyllium]   . Percocet [Oxycodone-Acetaminophen]     Itching & swelling in jaw    Immunization History  Administered Date(s) Administered  . Influenza Split 07/23/2012  . Influenza Whole 09/06/2007, 08/11/2008, 08/24/2009, 08/03/2010  . Influenza, High Dose Seasonal PF 08/02/2013, 06/29/2016  . Influenza,inj,Quad PF,36+ Mos 06/27/2014, 06/26/2015  . Pneumococcal Conjugate-13 08/28/2014  . Pneumococcal Polysaccharide-23 09/13/2010  . Td 05/12/2009  . Zoster 02/11/2008    Past Medical History:  Diagnosis Date  . Anxiety   . Arthritis   . CAD (coronary artery disease)   . Crohn's colitis ([place])    she reports ulcerative colitis beginning in her 70's, biopsies 2008 suggest Crohn's not UC  . Depression   . GERD (gastroesophageal reflux disease)   . Hyperlipidemia   . Hypertension   . Keloid of skin   . Obesity   . OSA (obstructive sleep apnea)   . Pancreatitis   . Paroxysmal atrial fibrillation (HCC)   .  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 Renal oncocytoma    s/p right nephrectomy    Tobacco History: History  Smoking Status  . Never Smoker  Smokeless Tobacco  . Never Used   Counseling given: Not Answered   Outpatient Encounter Prescriptions as of 01/19/2017  Medication Sig  . acetaminophen (TYLENOL) 325 MG tablet Take 650 mg by mouth every 6 (six) hours as needed.  Marland Kitchen amLODipine (NORVASC) 10 MG tablet TAKE 1 TABLET (10 MG TOTAL) BY MOUTH DAILY.  Marland Kitchen dofetilide (TIKOSYN) 125 MCG capsule Take 3 capsules (375 mcg total) by mouth 2 (two) times daily.  Marland Kitchen escitalopram (LEXAPRO) 10 MG tablet TAKE 1 TABLET (10 MG TOTAL) BY MOUTH DAILY.  . fluticasone (FLONASE) 50 MCG/ACT nasal spray Place 2 sprays into both nostrils daily.  . hydrALAZINE (APRESOLINE) 50 MG tablet Take 1 tablet (50 mg total) by mouth 3 (three) times daily.  . hydrocortisone (ANUSOL-HC) 25 MG suppository Place 1 suppository (25 mg total) rectally at bedtime as needed for hemorrhoids or itching.  . InFLIXimab (REMICADE IV) Inject into the vein as directed.  . loperamide (IMODIUM A-D) 2 MG tablet Take 1 mg by mouth daily as needed. Dihrrhea  . losartan (COZAAR) 100 MG tablet TAKE 1 TABLET (100 MG TOTAL) BY  MOUTH DAILY.  . metoprolol tartrate (LOPRESSOR) 25 MG tablet Take 0.5 tablets (12.5 mg total) by mouth 2 (two) times daily.  . Multiple Vitamin (MULTIVITAMIN) capsule Take 1 capsule by mouth daily. 1 daily    . nitroGLYCERIN (NITROSTAT) 0.4 MG SL tablet Place 1 tablet (0.4 mg total) under the tongue every 5 (five) minutes as needed. 1 tab under tongue every 5 min for chest pain not to exceed 3 tabs in 15 min  . Probiotic Product ([place]) CAPS Take 1 capsule by mouth daily. 1 per day  . Vitamin D, Cholecalciferol, 1000 units TABS Take by mouth daily.  Marland Kitchen warfarin (COUMADIN) 2.5 MG tablet TAKE 1-1.5 TABLETS BY MOUTH DAILY AS DIRECTED BY COUMADIN CLINIC  . [DISCONTINUED] doxycycline (VIBRA-TABS) 100 MG  tablet Take 1 tablet (100 mg total) by mouth 2 (two) times daily. (Patient not taking: Reported on 01/19/2017)  . [DISCONTINUED] escitalopram (LEXAPRO) 10 MG tablet TAKE 1 TABLET (10 MG TOTAL) BY MOUTH DAILY.   No facility-administered encounter medications on file as of 01/19/2017.      Review of Systems  Constitutional:   No  weight loss, night sweats,  Fevers, chills, fatigue, or  lassitude.  HEENT:   No headaches,  Difficulty swallowing,  Tooth/dental problems, or  Sore throat,                No sneezing, itching, ear ache, nasal congestion, post nasal drip,   CV:  No chest pain,  Orthopnea, PND, swelling in lower extremities, anasarca, dizziness, palpitations, syncope.   GI  No heartburn, indigestion, abdominal pain, nausea, vomiting, diarrhea, change in bowel habits, loss of appetite, bloody stools.   Resp: No shortness of breath with exertion or at rest.  No excess mucus, no productive cough,  No non-productive cough,  No coughing up of blood.  No change in color of mucus.  No wheezing.  No chest wall deformity  Skin: no rash or lesions.  GU: no dysuria, change in color of urine, no urgency or frequency.  No flank pain, no hematuria   MS:  No joint pain or swelling.  No decreased range of motion.  No back pain.    Physical Exam  BP 131/75 (BP Location: Left Arm, Cuff Size: Large)   Pulse (!) 57   Ht 5\' 5"  (1.651 m)   Wt 238 lb (108 kg)   SpO2 96%   BMI 39.61 kg/m   GEN: A/Ox3; pleasant , NAD, obese    HEENT:  [place]/AT,    Ext , tr edema   Skin: Warm, no lesions or rashes   Lab Results:  CBC   Imaging: No results found.   Assessment & Plan:   OSA (obstructive sleep apnea) Controlled on CPAP   Plan  Patient Instructions  Continue on CPAP At bedtime   Keep up good work  Do not drive if sleepy.  Work on weight loss.  Follow up with Dr. Elsworth Soho  In 1 year and As needed   Please contact office for sooner follow up if symptoms do not improve or worsen or  seek emergency care      Obesity Wt loss      Rexene Edison, NP 01/19/2017

## 2017-01-19 NOTE — Assessment & Plan Note (Signed)
Controlled on CPAP   Plan  Patient Instructions  Continue on CPAP At bedtime   Keep up good work  Do not drive if sleepy.  Work on weight loss.  Follow up with Dr. Elsworth Soho  In 1 year and As needed   Please contact office for sooner follow up if symptoms do not improve or worsen or seek emergency care

## 2017-01-19 NOTE — Patient Instructions (Signed)
Continue on CPAP At bedtime   Keep up good work  Do not drive if sleepy.  Work on weight loss.  Follow up with Dr. Elsworth Soho  In 1 year and As needed   Please contact office for sooner follow up if symptoms do not improve or worsen or seek emergency care

## 2017-01-19 NOTE — Assessment & Plan Note (Signed)
Wt loss  

## 2017-01-23 ENCOUNTER — Ambulatory Visit (INDEPENDENT_AMBULATORY_CARE_PROVIDER_SITE_OTHER): Payer: Medicare Other | Admitting: Pharmacist Clinician (PhC)/ Clinical Pharmacy Specialist

## 2017-01-23 ENCOUNTER — Other Ambulatory Visit: Payer: Self-pay | Admitting: Family Medicine

## 2017-01-23 DIAGNOSIS — I480 Paroxysmal atrial fibrillation: Secondary | ICD-10-CM | POA: Diagnosis not present

## 2017-01-23 DIAGNOSIS — Z5181 Encounter for therapeutic drug level monitoring: Secondary | ICD-10-CM | POA: Diagnosis not present

## 2017-01-23 DIAGNOSIS — I2510 Atherosclerotic heart disease of native coronary artery without angina pectoris: Secondary | ICD-10-CM

## 2017-01-23 LAB — POCT INR: INR: 1.9

## 2017-01-23 MED ORDER — ESCITALOPRAM OXALATE 10 MG PO TABS: 10.0000 mg | ORAL_TABLET | Freq: Every day | ORAL | 1 refills | Status: DC

## 2017-01-26 NOTE — Progress Notes (Signed)
Reviewed & agree with plan  

## 2017-02-15 ENCOUNTER — Ambulatory Visit (INDEPENDENT_AMBULATORY_CARE_PROVIDER_SITE_OTHER): Payer: Medicare Other | Admitting: Pharmacist

## 2017-02-15 DIAGNOSIS — I480 Paroxysmal atrial fibrillation: Secondary | ICD-10-CM | POA: Diagnosis not present

## 2017-02-15 DIAGNOSIS — I2510 Atherosclerotic heart disease of native coronary artery without angina pectoris: Secondary | ICD-10-CM

## 2017-02-15 DIAGNOSIS — Z5181 Encounter for therapeutic drug level monitoring: Secondary | ICD-10-CM

## 2017-02-15 LAB — POCT INR: INR: 2.4

## 2017-02-19 ENCOUNTER — Other Ambulatory Visit: Payer: Self-pay | Admitting: Cardiology

## 2017-02-20 ENCOUNTER — Other Ambulatory Visit: Payer: Self-pay | Admitting: Family Medicine

## 2017-02-20 NOTE — Telephone Encounter (Signed)
REFILL 

## 2017-02-22 ENCOUNTER — Other Ambulatory Visit: Payer: Self-pay | Admitting: Cardiology

## 2017-02-24 DIAGNOSIS — M0589 Other rheumatoid arthritis with rheumatoid factor of multiple sites: Secondary | ICD-10-CM | POA: Diagnosis not present

## 2017-03-02 DIAGNOSIS — Z85828 Personal history of other malignant neoplasm of skin: Secondary | ICD-10-CM | POA: Diagnosis not present

## 2017-03-02 DIAGNOSIS — Z79899 Other long term (current) drug therapy: Secondary | ICD-10-CM | POA: Diagnosis not present

## 2017-03-02 DIAGNOSIS — M65322 Trigger finger, left index finger: Secondary | ICD-10-CM | POA: Diagnosis not present

## 2017-03-02 DIAGNOSIS — Z6841 Body Mass Index (BMI) 40.0 and over, adult: Secondary | ICD-10-CM | POA: Diagnosis not present

## 2017-03-02 DIAGNOSIS — M0589 Other rheumatoid arthritis with rheumatoid factor of multiple sites: Secondary | ICD-10-CM | POA: Diagnosis not present

## 2017-03-02 DIAGNOSIS — H209 Unspecified iridocyclitis: Secondary | ICD-10-CM | POA: Diagnosis not present

## 2017-03-02 DIAGNOSIS — M545 Low back pain: Secondary | ICD-10-CM | POA: Diagnosis not present

## 2017-03-02 DIAGNOSIS — Q600 Renal agenesis, unilateral: Secondary | ICD-10-CM | POA: Diagnosis not present

## 2017-03-02 DIAGNOSIS — K5090 Crohn's disease, unspecified, without complications: Secondary | ICD-10-CM | POA: Diagnosis not present

## 2017-03-15 ENCOUNTER — Ambulatory Visit (INDEPENDENT_AMBULATORY_CARE_PROVIDER_SITE_OTHER): Payer: Medicare Other | Admitting: Pharmacist

## 2017-03-15 DIAGNOSIS — I2510 Atherosclerotic heart disease of native coronary artery without angina pectoris: Secondary | ICD-10-CM

## 2017-03-15 DIAGNOSIS — I480 Paroxysmal atrial fibrillation: Secondary | ICD-10-CM

## 2017-03-15 DIAGNOSIS — Z5181 Encounter for therapeutic drug level monitoring: Secondary | ICD-10-CM | POA: Diagnosis not present

## 2017-03-15 LAB — POCT INR: INR: 1.9

## 2017-03-21 DIAGNOSIS — H25811 Combined forms of age-related cataract, right eye: Secondary | ICD-10-CM | POA: Diagnosis not present

## 2017-03-21 DIAGNOSIS — H524 Presbyopia: Secondary | ICD-10-CM | POA: Diagnosis not present

## 2017-03-21 DIAGNOSIS — H26492 Other secondary cataract, left eye: Secondary | ICD-10-CM | POA: Diagnosis not present

## 2017-03-21 DIAGNOSIS — H35342 Macular cyst, hole, or pseudohole, left eye: Secondary | ICD-10-CM | POA: Diagnosis not present

## 2017-04-05 ENCOUNTER — Ambulatory Visit (INDEPENDENT_AMBULATORY_CARE_PROVIDER_SITE_OTHER): Payer: Medicare Other | Admitting: Pharmacist

## 2017-04-05 DIAGNOSIS — I2510 Atherosclerotic heart disease of native coronary artery without angina pectoris: Secondary | ICD-10-CM

## 2017-04-05 DIAGNOSIS — I480 Paroxysmal atrial fibrillation: Secondary | ICD-10-CM

## 2017-04-05 DIAGNOSIS — Z5181 Encounter for therapeutic drug level monitoring: Secondary | ICD-10-CM

## 2017-04-05 LAB — POCT INR: INR: 2.1

## 2017-04-08 ENCOUNTER — Other Ambulatory Visit: Payer: Self-pay | Admitting: Cardiology

## 2017-04-12 DIAGNOSIS — H2511 Age-related nuclear cataract, right eye: Secondary | ICD-10-CM | POA: Diagnosis not present

## 2017-04-13 ENCOUNTER — Encounter: Payer: Self-pay | Admitting: Physician Assistant

## 2017-04-13 ENCOUNTER — Ambulatory Visit (INDEPENDENT_AMBULATORY_CARE_PROVIDER_SITE_OTHER): Payer: Medicare Other | Admitting: Physician Assistant

## 2017-04-13 VITALS — BP 154/74 | HR 56 | Ht 63.0 in | Wt 242.0 lb

## 2017-04-13 DIAGNOSIS — I2510 Atherosclerotic heart disease of native coronary artery without angina pectoris: Secondary | ICD-10-CM

## 2017-04-13 DIAGNOSIS — Z01818 Encounter for other preprocedural examination: Secondary | ICD-10-CM | POA: Diagnosis not present

## 2017-04-13 DIAGNOSIS — G4733 Obstructive sleep apnea (adult) (pediatric): Secondary | ICD-10-CM

## 2017-04-13 DIAGNOSIS — E785 Hyperlipidemia, unspecified: Secondary | ICD-10-CM | POA: Diagnosis not present

## 2017-04-13 DIAGNOSIS — I10 Essential (primary) hypertension: Secondary | ICD-10-CM | POA: Diagnosis not present

## 2017-04-13 DIAGNOSIS — I480 Paroxysmal atrial fibrillation: Secondary | ICD-10-CM

## 2017-04-13 MED ORDER — METOPROLOL TARTRATE 25 MG PO TABS: 25.0000 mg | ORAL_TABLET | Freq: Two times a day (BID) | ORAL | 2 refills | Status: DC

## 2017-04-13 NOTE — Patient Instructions (Signed)
Medication Instructions: No changes   Follow-Up: Your physician wants you to follow-up in: 6 months with [name]. You will receive a reminder letter in the mail two months in advance. If you don't receive a letter, please call our office to schedule the follow-up appointment.   Any Additional Special Instructions Will Be Listed Below (If Applicable).  Please monitor your blood pressure at home. If it stays above 808 systolic, please call the office to let us know.     If you need a refill on your cardiac medications before your next appointment, please call your pharmacy.

## 2017-04-13 NOTE — Progress Notes (Signed)
Cardiology Office Note    Date:  04/13/2017   ID:  [name], DOB 1946/04/21, MRN [id number]  PCP:  [name], MD  Cardiologist:  [name]  Chief Complaint  Patient presents with  . Follow-up    seen for [name]  . Pre-op Exam    requested by Dr. Phineas Real for R eye cataract surgery   Patient was seen today for preoperative clearance requested by Dr. Karl Luke of [facility] prior to her R eye cataract surgery in July 2018   History of Present Illness:  [name] is a 71 y.o. female with PMH of hypertension, hyperlipidemia, obstructive sleep apnea, paroxysmal atrial fibrillation and CAD. Previous cardiac catheterization on 03/06/2007 showed EF 55%, nonobstructive plaque in the LAD, and left circumflex and the RCA was normal. She is extremely sensitive to Coumadin. MRA of the abdomen in September 2012 showed no left renal artery stenosis, previous right nephrectomy. Workup for pheochromocytoma was negative. Renal Doppler in May 2013 showed absent right kidney, normal left renal artery. Echo in June 2014 showed a normal LV function, grade 1 diastolic dysfunction, mild left atrial enlargement. Carotid Doppler in March 2017 was mild bilaterally. Event monitor in January 2018 showed sinus rhythm with PACs, PVCs and the ectopic atrial tachycardia. She was noted to be back in atrial fibrillation in January, however before she underwent outpatient cardioversion, she self converted. Otherwise she seems to be very well controlled on Tikosyn. Six month follow-up was recommended.  She has some baseline dyspnea on exertion, however never had any chest discomfort. She has not had any significant recurrence of atrial fibrillation recently. She has been compliant on her medication. She is actually taking metoprolol 25 mg twice a day instead of 12.5 mg twice a day. Her blood pressure is elevated today, however it is usually normal at home. He was actually normal on  previous office visit in March and February 2018. She is a fairly low risk patient for the intended procedure. I did call Dr. Zelphia Cairo office and was informed she does not need to come off the Coumadin for the cataract surgery. We will forward preoperative clearance letter to Dr. Zelphia Cairo office. She can follow-up in 6 month. I recommended for her to monitor her blood pressure at home, if systolic blood pressure consistently in the 150s, she will need to let us know.    Past Medical History:  Diagnosis Date  . Anxiety   . Arthritis   . CAD (coronary artery disease)   . Crohn's colitis ([place])    she reports ulcerative colitis beginning in her 20's, biopsies 2008 suggest Crohn's not UC  . Depression   . GERD (gastroesophageal reflux disease)   . Hyperlipidemia   . Hypertension   . Keloid of skin   . Obesity   . OSA (obstructive sleep apnea)   . Pancreatitis   . Paroxysmal atrial fibrillation (HCC)   . 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 Renal oncocytoma    s/p right nephrectomy    Past Surgical History:  Procedure Laterality Date  . BREAST BIOPSY  1996   benign lesion   . Cataract surgery Left 11/13/14  . CHOLECYSTECTOMY  10/2006  . COLONOSCOPY W/ BIOPSIES    . CORONARY ANGIOPLASTY    . ESOPHAGOGASTRODUODENOSCOPY    . NEPHRECTOMY  10/2006   right secondary to oncocytoma   . RIGHT FEMORAL  PSEUDOANEURYSM & RIGHT GROIN HEMATOMA EVACUATION  02/2007  . RIGHT  RENAL ARTERY REPAIR  1976  . TUBAL LIGATION      Current Medications: Outpatient Medications Prior to Visit  Medication Sig Dispense Refill  . acetaminophen (TYLENOL) 325 MG tablet Take 650 mg by mouth every 6 (six) hours as needed.    Marland Kitchen amLODipine (NORVASC) 10 MG tablet TAKE 1 TABLET (10 MG TOTAL) BY MOUTH DAILY. 90 tablet 3  . dofetilide (TIKOSYN) 125 MCG capsule TAKE 3 CAPSULES (375 MCG TOTAL) BY MOUTH 2 (TWO) TIMES DAILY. 180 capsule 2  . escitalopram (LEXAPRO) 10 MG tablet Take 1 tablet (10 mg total) by  mouth daily. 90 tablet 1  . fluticasone (FLONASE) 50 MCG/ACT nasal spray Place 2 sprays into both nostrils daily. 16 g 6  . hydrALAZINE (APRESOLINE) 50 MG tablet Take 1 tablet (50 mg total) by mouth 3 (three) times daily. 60 tablet 3  . hydrocortisone (ANUSOL-HC) 25 MG suppository Place 1 suppository (25 mg total) rectally at bedtime as needed for hemorrhoids or itching. 12 suppository 2  . InFLIXimab (REMICADE IV) Inject into the vein as directed.    . loperamide (IMODIUM A-D) 2 MG tablet Take 1 mg by mouth daily as needed. Dihrrhea    . losartan (COZAAR) 100 MG tablet TAKE 1 TABLET (100 MG TOTAL) BY MOUTH DAILY. 90 tablet 1  . Multiple Vitamin (MULTIVITAMIN) capsule Take 1 capsule by mouth daily. 1 daily      . nitroGLYCERIN (NITROSTAT) 0.4 MG SL tablet Place 1 tablet (0.4 mg total) under the tongue every 5 (five) minutes as needed. 1 tab under tongue every 5 min for chest pain not to exceed 3 tabs in 15 min 25 tablet 6  . Probiotic Product ([place]) CAPS Take 1 capsule by mouth daily. 1 per day    . Vitamin D, Cholecalciferol, 1000 units TABS Take by mouth daily.    Marland Kitchen warfarin (COUMADIN) 2.5 MG tablet TAKE 1-1.5 TABLETS BY MOUTH DAILY AS DIRECTED BY COUMADIN CLINIC 120 tablet 0  . metoprolol tartrate (LOPRESSOR) 25 MG tablet Take 0.5 tablets (12.5 mg total) by mouth 2 (two) times daily. 90 tablet 3  . metoprolol tartrate (LOPRESSOR) 25 MG tablet Take 0.5 tablets (12.5 mg total) by mouth 2 (two) times daily. 180 tablet 2   No facility-administered medications prior to visit.      Allergies:   Cefdinir; Codeine; Guaifenesin er; Latex; Mucilgen [psyllium]; and Percocet [oxycodone-acetaminophen]   Social History   Social History  . Marital status: Married    Spouse name: N/A  . Number of children: 3  . Years of education: N/A   Occupational History  . Retired Retired    retired   Social History Main Topics  . Smoking status: Never Smoker  . Smokeless tobacco: Never  Used  . Alcohol use No  . Drug use: No  . Sexual activity: Not Currently   Other Topics Concern  . 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   ROS:   Please  see the history of present illness.    ROS All other systems reviewed and are negative.   PHYSICAL EXAM:   VS:  BP (!) 154/74   Pulse (!) 56   Ht 5\' 3"  (1.6 m)   Wt 242 lb (109.8 kg)   BMI 42.87 kg/m    GEN: Well nourished, well developed, in no acute distress  HEENT: normal  Neck: no JVD, carotid bruits, or masses Cardiac: RRR; no murmurs, rubs, or gallops,no edema  Respiratory:  clear to auscultation bilaterally, normal work of breathing GI: soft, nontender, nondistended, + BS MS: no deformity or atrophy  Skin: warm and dry, no rash Neuro:  Alert and Oriented x 3, Strength and sensation are intact Psych: euthymic mood, full affect  Wt Readings from Last 3 Encounters:  04/13/17 242 lb (109.8 kg)  01/19/17 238 lb (108 kg)  12/20/16 238 lb (108 kg)      Studies/Labs Reviewed:   EKG:  EKG is ordered today.  The ekg ordered today demonstrates Sinus bradycardia without significant ST-T wave changes  Recent Labs: 06/20/2016: ALT 19 11/14/2016: BUN 20; Creat 1.25; Hemoglobin 15.3; Magnesium 2.1; Platelets 255; Potassium 4.6; Sodium 140; TSH 2.79   Lipid Panel    Component Value Date/Time   CHOL 172 06/20/2016 1027   TRIG 136.0 06/20/2016 1027   HDL 45.80 06/20/2016 1027   CHOLHDL 4 06/20/2016 1027   VLDL 27.2  06/20/2016 1027   Coffee 99 06/20/2016 1027    Additional studies/ records that were reviewed today include:   Echo 04/02/2013 LV EF: 60% -  65%  ------------------------------------------------------------ Study Conclusions  - Left ventricle: The cavity size was normal. Wall thickness was increased in a pattern of mild LVH. Systolic function was normal. The estimated ejection fraction was in the range of 60% to 65%. Wall motion was normal; there were no regional wall motion abnormalities. Doppler parameters are consistent with abnormal left ventricular relaxation (grade 1 diastolic dysfunction). - Left atrium: The atrium was mildly dilated. - Atrial septum: No defect or patent foramen ovale was identified. - Pulmonary arteries: PA peak pressure: 11mm Hg (S).   ASSESSMENT:    1. Preoperative clearance   2. Essential hypertension   3. Hyperlipidemia, unspecified hyperlipidemia type   4. OSA (obstructive sleep apnea)   5. PAF (paroxysmal atrial fibrillation) ([place])   6. Coronary artery disease involving native coronary artery of native heart without angina pectoris      PLAN:  In order of problems listed above:  1. Preoperative clearance: She has not had any angina symptom, previous cardiac catheterization showed nonobstructive disease. She has some baseline dyspnea on exertion, however the degree has not changed. She is cleared for right eye cataract surgery, I have called Dr. Zelphia Cairo office and was told she does not need to come off Coumadin for the procedure. She is a low-risk patient for a low risk procedure.  2. CAD: No obvious angina, previous cardiac catheterization 2008 showed nonobstructive disease  3. PAF: Well-controlled on Coumadin and also Tikosyn  4. Hypertension: Blood pressure mildly elevated today, I rechecked it her systolic blood pressure went down to 144. Since her blood pressure was normal in February and March, I will hold off on  adding any medications at this time, she will monitor her blood pressure at home, if her systolic blood pressure consistently elevated into the 150s range, she has been instructed to contact cardiology for medication adjustment.  5. Hyperlipidemia: Annual repeat left by primary care physician.  Medication Adjustments/Labs and Tests Ordered: Current medicines are reviewed at length with the patient today.  Concerns regarding medicines are outlined above.  Medication changes, Labs and Tests ordered today are listed in the Patient Instructions below. Patient Instructions  Medication Instructions: No changes   Follow-Up: Your physician wants you to follow-up in: 6 months with [name]. You will receive a reminder letter in the mail two months in advance. If you don't receive a letter, please call our office to schedule the follow-up appointment.   Any Additional Special Instructions Will Be Listed Below (If Applicable).  Please monitor your blood pressure at home. If it stays above 937 systolic, please call the office to let us know.     If you need a refill on your cardiac medications before your next appointment, please call your pharmacy.      Hilbert Corrigan, [place]  04/13/2017 1:14 PM    [facility] [address], [place], [place]  [address] Phone: [phone]; Fax: [phone]

## 2017-04-21 DIAGNOSIS — M0589 Other rheumatoid arthritis with rheumatoid factor of multiple sites: Secondary | ICD-10-CM | POA: Diagnosis not present

## 2017-05-03 ENCOUNTER — Ambulatory Visit (INDEPENDENT_AMBULATORY_CARE_PROVIDER_SITE_OTHER): Payer: Medicare Other | Admitting: Pharmacist

## 2017-05-03 ENCOUNTER — Other Ambulatory Visit: Payer: Self-pay | Admitting: Cardiology

## 2017-05-03 DIAGNOSIS — I2510 Atherosclerotic heart disease of native coronary artery without angina pectoris: Secondary | ICD-10-CM

## 2017-05-03 DIAGNOSIS — I480 Paroxysmal atrial fibrillation: Secondary | ICD-10-CM

## 2017-05-03 DIAGNOSIS — Z5181 Encounter for therapeutic drug level monitoring: Secondary | ICD-10-CM | POA: Diagnosis not present

## 2017-05-03 LAB — POCT INR: INR: 2.6

## 2017-05-04 NOTE — Telephone Encounter (Signed)
Rx(s) sent to pharmacy electronically.  

## 2017-05-15 DIAGNOSIS — H2511 Age-related nuclear cataract, right eye: Secondary | ICD-10-CM | POA: Diagnosis not present

## 2017-05-15 DIAGNOSIS — H25811 Combined forms of age-related cataract, right eye: Secondary | ICD-10-CM | POA: Diagnosis not present

## 2017-05-26 ENCOUNTER — Other Ambulatory Visit: Payer: Self-pay | Admitting: Cardiology

## 2017-05-29 ENCOUNTER — Encounter: Payer: Self-pay | Admitting: Family Medicine

## 2017-05-29 ENCOUNTER — Ambulatory Visit (INDEPENDENT_AMBULATORY_CARE_PROVIDER_SITE_OTHER): Payer: Medicare Other | Admitting: Family Medicine

## 2017-05-29 VITALS — BP 140/78 | HR 53 | Temp 98.0°F | Resp 18 | Wt 243.2 lb

## 2017-05-29 DIAGNOSIS — I2510 Atherosclerotic heart disease of native coronary artery without angina pectoris: Secondary | ICD-10-CM | POA: Diagnosis not present

## 2017-05-29 DIAGNOSIS — L989 Disorder of the skin and subcutaneous tissue, unspecified: Secondary | ICD-10-CM

## 2017-05-29 DIAGNOSIS — M069 Rheumatoid arthritis, unspecified: Secondary | ICD-10-CM | POA: Insufficient documentation

## 2017-05-29 DIAGNOSIS — E782 Mixed hyperlipidemia: Secondary | ICD-10-CM | POA: Diagnosis not present

## 2017-05-29 DIAGNOSIS — I10 Essential (primary) hypertension: Secondary | ICD-10-CM

## 2017-05-29 DIAGNOSIS — E1065 Type 1 diabetes mellitus with hyperglycemia: Secondary | ICD-10-CM

## 2017-05-29 DIAGNOSIS — Z85828 Personal history of other malignant neoplasm of skin: Secondary | ICD-10-CM

## 2017-05-29 DIAGNOSIS — C44320 Squamous cell carcinoma of skin of unspecified parts of face: Secondary | ICD-10-CM | POA: Insufficient documentation

## 2017-05-29 DIAGNOSIS — M810 Age-related osteoporosis without current pathological fracture: Secondary | ICD-10-CM

## 2017-05-29 HISTORY — DX: Rheumatoid arthritis, unspecified: M06.9

## 2017-05-29 LAB — TSH: TSH: 3.44 u[IU]/mL (ref 0.35–4.50)

## 2017-05-29 LAB — COMPREHENSIVE METABOLIC PANEL
ALBUMIN: 3.9 g/dL (ref 3.5–5.2)
ALK PHOS: 65 U/L (ref 39–117)
ALT: 17 U/L (ref 0–35)
AST: 20 U/L (ref 0–37)
BILIRUBIN TOTAL: 0.5 mg/dL (ref 0.2–1.2)
BUN: 17 mg/dL (ref 6–23)
CALCIUM: 9.3 mg/dL (ref 8.4–10.5)
CO2: 25 mEq/L (ref 19–32)
CREATININE: 1.07 mg/dL (ref 0.40–1.20)
Chloride: 105 mEq/L (ref 96–112)
GFR: 53.61 mL/min — ABNORMAL LOW (ref >60.00)
Glucose, Bld: 92 mg/dL (ref 70–99)
Potassium: 4.2 mEq/L (ref 3.5–5.1)
Sodium: 140 mEq/L (ref 135–145)
TOTAL PROTEIN: 7.7 g/dL (ref 6.0–8.3)

## 2017-05-29 LAB — CBC
HCT: 41 % (ref 36.0–46.0)
Hemoglobin: 13.5 g/dL (ref 12.0–15.0)
MCHC: 33 g/dL (ref 30.0–36.0)
MCV: 96.1 fl (ref 78.0–100.0)
PLATELETS: 221 10*3/uL (ref 150.0–400.0)
RBC: 4.27 Mil/uL (ref 3.87–5.11)
RDW: 13.8 % (ref 11.5–15.5)
WBC: 7.3 10*3/uL (ref 4.0–10.5)

## 2017-05-29 LAB — LIPID PANEL
CHOL/HDL RATIO: 4
CHOLESTEROL: 159 mg/dL (ref 0–200)
HDL: 44 mg/dL (ref >39.00)
LDL Cholesterol: 88 mg/dL (ref 0–99)
NonHDL: 114.78
TRIGLYCERIDES: 135 mg/dL (ref 0.0–149.0)
VLDL: 27 mg/dL (ref 0.0–40.0)

## 2017-05-29 LAB — VITAMIN D 25 HYDROXY (VIT D DEFICIENCY, FRACTURES): VITD: 57.32 ng/mL (ref 30.00–100.00)

## 2017-05-29 NOTE — Assessment & Plan Note (Signed)
Tolerating statin, encouraged heart healthy diet, avoid trans fats, minimize simple carbs and saturated fats. Increase exercise as tolerated 

## 2017-05-29 NOTE — Assessment & Plan Note (Signed)
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 Will refer to dermatology for further consideration

## 2017-05-29 NOTE — Assessment & Plan Note (Addendum)
Recommend calcium intake of 1200 to 1500 mg daily, divided into roughly 3 doses. Best source is the diet and a single dairy serving is about 500 mg, a supplement of calcium citrate once or twice daily to balance diet is fine if not getting enough in diet. Also need Vitamin D 2000 IU caps, 1 cap daily if not already taking vitamin D. Also recommend weight baring exercise on hips and upper body to keep bones strong 

## 2017-05-29 NOTE — Progress Notes (Signed)
Subjective:  I acted as a Education administrator for Dr. Charlett Blake. [place], [place]  Patient ID: [name], female    DOB: [birth year], 71 y.o.   MRN: [id number]  No chief complaint on file.   HPI  Patient is in today for a follow up. She feels well today but she does have a couple of concerns. First she notes a nonhealing lesion on the lateral left aspect of her nose and a nonhealing lesion on her anterior chest wall. She does endorse she picks at both of them but they have been present for a couple of months. She has a history of basal cell carcinoma on her nose and her forehead in the past and needs a referral to a new dermatologist. She is currently walking about 2000 steps a day but denies any other exercise. No recent febrile illness or hospitalizations. No polyuria or polydipsia. Denies CP/palp/SOB/HA/congestion/fevers/GI or GU c/o. Taking meds as prescribed  Patient Care Team: [name], MD as PCP - General (Family Medicine) Stanford Breed, Denice Bors, MD (Cardiology) Gatha Mayer, MD as Consulting Physician (Gastroenterology) Hollar, Katharine Look, MD as Referring Physician (Dermatology) Rigoberto Noel, MD as Consulting Physician (Pulmonary Disease) Calvert Cantor, MD as Consulting Physician (Ophthalmology) Sherlynn Stalls, MD as Consulting Physician (Ophthalmology) Hennie Duos, MD as Consulting Physician (Rheumatology)   Past Medical History:  Diagnosis Date  . Anxiety   . Arthritis   . CAD (coronary artery disease)   . Crohn's colitis ([place])    she reports ulcerative colitis beginning in her 53's, biopsies 2008 suggest Crohn's not UC  . Depression   . GERD (gastroesophageal reflux disease)   . Hyperlipidemia   . Hypertension   . Keloid of skin   . Obesity   . OSA (obstructive sleep apnea)   . Pancreatitis   . Paroxysmal atrial fibrillation (HCC)   . 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 Renal oncocytoma    s/p right nephrectomy  . Rheumatoid arthritis ([place])  05/29/2017    Past Surgical History:  Procedure Laterality Date  . BREAST BIOPSY  1996   benign lesion   . Cataract surgery Left 11/13/14  . CHOLECYSTECTOMY  10/2006  . COLONOSCOPY W/ BIOPSIES    . CORONARY ANGIOPLASTY    . ESOPHAGOGASTRODUODENOSCOPY    . NEPHRECTOMY  10/2006   right secondary to oncocytoma   . RIGHT FEMORAL  PSEUDOANEURYSM & RIGHT GROIN HEMATOMA EVACUATION  02/2007  . RIGHT RENAL ARTERY REPAIR  1976  . TUBAL LIGATION      Family History  Problem Relation Age of Onset  . Colitis Father   . Heart disease Father   . Hypertension Mother   . Coronary artery disease Mother   . Stroke Mother   . Clotting disorder Mother   . Heart disease Mother   . Allergies Mother   . Stroke Brother   . Colon cancer Paternal Aunt   . Clotting disorder Brother   . Clotting disorder Sister   . Asthma Daughter   . Asthma Maternal Uncle   . Colon polyps Neg Hx   . Kidney disease Neg Hx     Social History   Social History  . Marital status: Married    Spouse name: N/A  . Number of children: 3  . Years of education: N/A   Occupational History  . Retired Retired    retired   Social History Main Topics  . Smoking status: Never Smoker  . Smokeless tobacco: Never  Used  . Alcohol use No  . Drug use: No  . Sexual activity: Not Currently   Other Topics Concern  . 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 acetaminophen (TYLENOL) 325 MG tablet Take 650 mg by mouth every 6 (six) hours as needed.    Marland Kitchen amLODipine (NORVASC) 10 MG tablet TAKE 1 TABLET (10 MG TOTAL) BY MOUTH DAILY. 90 tablet 3  . dofetilide (TIKOSYN) 125 MCG capsule TAKE 3 CAPSULES (375 MCG TOTAL) BY MOUTH 2 (TWO) TIMES DAILY. 540 capsule 1  . escitalopram (LEXAPRO) 10 MG tablet Take 1 tablet (10 mg total)  by mouth daily. 90 tablet 1  . fluticasone (FLONASE) 50 MCG/ACT nasal spray Place 2 sprays into both nostrils daily. 16 g 6  . hydrALAZINE (APRESOLINE) 50 MG tablet TAKE 1 TABLET (50 MG TOTAL) BY MOUTH 3 (THREE) TIMES DAILY. 60 tablet 6  . hydrocortisone (ANUSOL-HC) 25 MG suppository Place 1 suppository (25 mg total) rectally at bedtime as needed for hemorrhoids or itching. 12 suppository 2  . InFLIXimab (REMICADE IV) Inject into the vein as directed.    . loperamide (IMODIUM A-D) 2 MG tablet Take 1 mg by mouth daily as needed. Dihrrhea    . losartan (COZAAR) 100 MG tablet TAKE 1 TABLET (100 MG TOTAL) BY MOUTH DAILY. 90 tablet 1  . metoprolol tartrate (LOPRESSOR) 25 MG tablet Take 1 tablet (25 mg total) by mouth 2 (two) times daily. 180 tablet 2  . Multiple Vitamin (MULTIVITAMIN) capsule Take 1 capsule by mouth daily. 1 daily      . nitroGLYCERIN (NITROSTAT) 0.4 MG SL tablet Place 1 tablet (0.4 mg total) under the tongue every 5 (five) minutes as needed. 1 tab under tongue every 5 min for chest pain not to exceed 3 tabs in 15 min 25 tablet 6  . Probiotic Product ([place]) CAPS Take 1 capsule by mouth daily. 1 per day    . Vitamin D, Cholecalciferol, 1000 units TABS Take by mouth daily.    Marland Kitchen warfarin (COUMADIN) 2.5 MG tablet TAKE 1-1.5 TABLETS BY MOUTH DAILY AS DIRECTED BY COUMADIN CLINIC 120 tablet 0   No facility-administered medications prior to visit.     Allergies  Allergen Reactions  . Cefdinir     Pt cannot take; may interfere with AFib.  . Codeine     Heart beats fast   . Guaifenesin Er Nausea And Vomiting and Other (See Comments)    Headaches; can tolerate liquid.  . Latex     Breathing problems  . Mucilgen [Psyllium]   . Percocet [Oxycodone-Acetaminophen]     Itching & swelling in jaw    Review of Systems  Constitutional: Negative for fever and malaise/fatigue.  HENT: Negative for congestion.   Eyes: Negative for blurred vision.  Respiratory: Negative for  shortness of breath.   Cardiovascular: Negative for chest pain, palpitations and leg swelling.  Gastrointestinal: Negative for abdominal pain, blood in stool and nausea.  Genitourinary: Negative for dysuria and frequency.  Musculoskeletal: Negative for falls.  Skin: Negative for rash.  Neurological: Negative for dizziness, loss of consciousness and headaches.  Endo/Heme/Allergies: Negative for environmental allergies.  Psychiatric/Behavioral: Negative for depression. The patient is not nervous/anxious.  Objective:    Physical Exam  Constitutional: She is oriented to person, place, and time. She appears well-developed and well-nourished. No distress.  HENT:  Head: Normocephalic and atraumatic.  Nose: Nose normal.  Eyes: Right eye exhibits no discharge. Left eye exhibits no discharge.  Neck: Normal range of motion. Neck supple.  Cardiovascular: Normal rate and regular rhythm.   No murmur heard. Pulmonary/Chest: Effort normal and breath sounds normal.  Abdominal: Soft. Bowel sounds are normal. There is no tenderness.  Musculoskeletal: She exhibits no edema.  Neurological: She is alert and oriented to person, place, and time.  Skin: Skin is warm and dry.  Psychiatric: She has a normal mood and affect.  Nursing note and vitals reviewed.   BP 140/78 (BP Location: Left Arm, Patient Position: Sitting, Cuff Size: Normal)   Pulse (!) 53   Temp 98 F (36.7 C) (Oral)   Resp 18   Wt 243 lb 3.2 oz (110.3 kg)   SpO2 95%   BMI 43.08 kg/m  Wt Readings from Last 3 Encounters:  05/29/17 243 lb 3.2 oz (110.3 kg)  04/13/17 242 lb (109.8 kg)  01/19/17 238 lb (108 kg)   BP Readings from Last 3 Encounters:  05/29/17 140/78  04/13/17 (!) 154/74  01/19/17 131/75     Immunization History  Administered Date(s) Administered  . Influenza Split 07/23/2012  . Influenza Whole 09/06/2007, 08/11/2008, 08/24/2009, 08/03/2010  . Influenza, High Dose Seasonal PF 08/02/2013, 06/29/2016  .  Influenza,inj,Quad PF,36+ Mos 06/27/2014, 06/26/2015  . Pneumococcal Conjugate-13 08/28/2014  . Pneumococcal Polysaccharide-23 09/13/2010  . Td 05/12/2009  . Zoster 02/11/2008    Health Maintenance  Topic Date Due  . Hepatitis C Screening  [birth year]  . FOOT EXAM  08/22/1955  . OPHTHALMOLOGY EXAM  08/22/1955  . COLONOSCOPY  09/10/2014  . HEMOGLOBIN A1C  12/21/2016  . INFLUENZA VACCINE  05/24/2017  . MAMMOGRAM  08/25/2018  . TETANUS/TDAP  05/13/2019  . DEXA SCAN  Completed  . PNA vac Low Risk Adult  Completed    Lab Results  Component Value Date   WBC 9.7 11/14/2016   HGB 15.3 11/14/2016   HCT 45.9 (H) 11/14/2016   PLT 255 11/14/2016   GLUCOSE 96 11/14/2016   CHOL 172 06/20/2016   TRIG 136.0 06/20/2016   HDL 45.80 06/20/2016   LDLCALC 99 06/20/2016   ALT 19 06/20/2016   AST 23 06/20/2016   NA 140 11/14/2016   K 4.6 11/14/2016   CL 104 11/14/2016   CREATININE 1.25 (H) 11/14/2016   BUN 20 11/14/2016   CO2 23 11/14/2016   TSH 2.79 11/14/2016   INR 2.6 05/03/2017   HGBA1C 5.4 06/20/2016    Lab Results  Component Value Date   TSH 2.79 11/14/2016   Lab Results  Component Value Date   WBC 9.7 11/14/2016   HGB 15.3 11/14/2016   HCT 45.9 (H) 11/14/2016   MCV 94.3 11/14/2016   PLT 255 11/14/2016   Lab Results  Component Value Date   NA 140 11/14/2016   K 4.6 11/14/2016   CO2 23 11/14/2016   GLUCOSE 96 11/14/2016   BUN 20 11/14/2016   CREATININE 1.25 (H) 11/14/2016   BILITOT 0.6 06/20/2016   ALKPHOS 70 06/20/2016   AST 23 06/20/2016   ALT 19 06/20/2016   PROT 8.0 06/20/2016   ALBUMIN 4.0 06/20/2016   CALCIUM 9.5 11/14/2016   ANIONGAP 11 11/13/2015   GFR 56.81 (L) 06/20/2016   Lab Results  Component Value Date  CHOL 172 06/20/2016   Lab Results  Component Value Date   HDL 45.80 06/20/2016   Lab Results  Component Value Date   LDLCALC 99 06/20/2016   Lab Results  Component Value Date   TRIG 136.0 06/20/2016   Lab Results  Component Value  Date   CHOLHDL 4 06/20/2016   Lab Results  Component Value Date   HGBA1C 5.4 06/20/2016         Assessment & Plan:   Problem List Items Addressed This Visit    Hyperlipidemia    Tolerating statin, encouraged heart healthy diet, avoid trans fats, minimize simple carbs and saturated fats. Increase exercise as tolerated      Relevant Orders   Lipid panel   Essential hypertension   Relevant Orders   CBC   Comprehensive metabolic panel   TSH   Osteoporosis      Recommend calcium intake of 1200 to 1500 mg daily, divided into roughly 3 doses. Best source is the diet and a single dairy serving is about 500 mg, a supplement of calcium citrate once or twice daily to balance diet is fine if not getting enough in diet. Also need Vitamin D 2000 IU caps, 1 cap daily if not already taking vitamin D. Also recommend weight baring exercise on hips and upper body to keep bones strong      Relevant Orders   VITAMIN D 25 Hydroxy (Vit-D Deficiency, Fractures)   DG Bone Density   Rheumatoid arthritis ([place])    Will request records from Rheumatology, Dr Amil Amen      Skin lesion    H/o BCC on nose and forehead now with nonhealing lesions on nose and chest over several months. Will refer to dermatology for further consideration      Relevant Orders   Ambulatory referral to Dermatology    Other Visit Diagnoses    History of basal cell carcinoma (BCC)    -  Primary   Relevant Orders   Ambulatory referral to Dermatology   Type 1 diabetes mellitus with hyperglycemia ([place])          I am having Ms. Stennis maintain her loperamide, multivitamin, [facility], InFLIXimab (REMICADE IV), nitroGLYCERIN, acetaminophen, hydrocortisone, amLODipine, Vitamin D (Cholecalciferol), fluticasone, escitalopram, losartan, warfarin, metoprolol tartrate, hydrALAZINE, and dofetilide.  No orders of the defined types were placed in this encounter.   CMA served as Education administrator during this visit. History,  Physical and Plan performed by medical provider. Documentation and orders reviewed and attested to.  Penni Homans, MD

## 2017-05-29 NOTE — Patient Instructions (Signed)
osteoporosis. Recommend calcium intake of 1200 to 1500 mg daily, divided into roughly 3 doses. Best source is the diet and a single dairy serving is about 500 mg, a supplement of calcium citrate once or twice daily to balance diet is fine if not getting enough in diet. Also need Vitamin D 2000 IU caps, 1 cap daily if not already taking vitamin D. Also recommend weight baring exercise on hips and upper body to keep bones strong. Citracal twice daily Osteoporosis Osteoporosis is the thinning and loss of density in the bones. Osteoporosis makes the bones more brittle, fragile, and likely to break (fracture). Over time, osteoporosis can cause the bones to become so weak that they fracture after a simple fall. The bones most likely to fracture are the bones in the hip, wrist, and spine. What are the causes? The exact cause is not known. What increases the risk? Anyone can develop osteoporosis. You may be at greater risk if you have a family history of the condition or have poor nutrition. You may also have a higher risk if you are:  Female.  72 years old or older.  A smoker.  Not physically active.  White or Asian.  Slender.  What are the signs or symptoms? A fracture might be the first sign of the disease, especially if it results from a fall or injury that would not usually cause a bone to break. Other signs and symptoms include:  Low back and neck pain.  Stooped posture.  Height loss.  How is this diagnosed? To make a diagnosis, your health care provider may:  Take a medical history.  Perform a physical exam.  Order tests, such as: ? A bone mineral density test. ? A dual-energy X-ray absorptiometry test.  How is this treated? The goal of osteoporosis treatment is to strengthen your bones to reduce your risk of a fracture. Treatment may involve:  Making lifestyle changes, such as: ? Eating a diet rich in calcium. ? Doing weight-bearing and muscle-strengthening  exercises. ? Stopping tobacco use. ? Limiting alcohol intake.  Taking medicine to slow the process of bone loss or to increase bone density.  Monitoring your levels of calcium and vitamin D.  Follow these instructions at home:  Include calcium and vitamin D in your diet. Calcium is important for bone health, and vitamin D helps the body absorb calcium.  Perform weight-bearing and muscle-strengthening exercises as directed by your health care provider.  Do not use any tobacco products, including cigarettes, chewing tobacco, and electronic cigarettes. If you need help quitting, ask your health care provider.  Limit your alcohol intake.  Take medicines only as directed by your health care provider.  Keep all follow-up visits as directed by your health care provider. This is important.  Take precautions at home to lower your risk of falling, such as: ? Keeping rooms well lit and clutter free. ? Installing safety rails on stairs. ? Using rubber mats in the bathroom and other areas that are often wet or slippery. Get help right away if: You fall or injure yourself. This information is not intended to replace advice given to you by your health care provider. Make sure you discuss any questions you have with your health care provider. Document Released: 07/20/2005 Document Revised: 03/14/2016 Document Reviewed: 03/20/2014 Elsevier Interactive Patient Education  2017 [employer].

## 2017-05-29 NOTE — Assessment & Plan Note (Signed)
Will request records from Rheumatology, Dr Amil Amen

## 2017-06-05 ENCOUNTER — Ambulatory Visit (HOSPITAL_BASED_OUTPATIENT_CLINIC_OR_DEPARTMENT_OTHER)
Admission: RE | Admit: 2017-06-05 | Discharge: 2017-06-05 | Disposition: A | Discharge status: Home / Self Care | Payer: Medicare Other | Source: Ambulatory Visit | Attending: Family Medicine | Admitting: Family Medicine

## 2017-06-05 DIAGNOSIS — M810 Age-related osteoporosis without current pathological fracture: Secondary | ICD-10-CM

## 2017-06-05 DIAGNOSIS — Z780 Asymptomatic menopausal state: Secondary | ICD-10-CM | POA: Diagnosis not present

## 2017-06-14 ENCOUNTER — Ambulatory Visit (INDEPENDENT_AMBULATORY_CARE_PROVIDER_SITE_OTHER): Payer: Medicare Other | Admitting: Pharmacist

## 2017-06-14 DIAGNOSIS — Z5181 Encounter for therapeutic drug level monitoring: Secondary | ICD-10-CM

## 2017-06-14 DIAGNOSIS — I480 Paroxysmal atrial fibrillation: Secondary | ICD-10-CM

## 2017-06-14 DIAGNOSIS — I2510 Atherosclerotic heart disease of native coronary artery without angina pectoris: Secondary | ICD-10-CM

## 2017-06-14 LAB — POCT INR: INR: 2.2

## 2017-06-14 MED ORDER — METOPROLOL TARTRATE 25 MG PO TABS: 25.0000 mg | ORAL_TABLET | Freq: Two times a day (BID) | ORAL | 2 refills | Status: DC

## 2017-06-16 DIAGNOSIS — M0589 Other rheumatoid arthritis with rheumatoid factor of multiple sites: Secondary | ICD-10-CM | POA: Diagnosis not present

## 2017-07-03 DIAGNOSIS — K5090 Crohn's disease, unspecified, without complications: Secondary | ICD-10-CM | POA: Diagnosis not present

## 2017-07-03 DIAGNOSIS — H209 Unspecified iridocyclitis: Secondary | ICD-10-CM | POA: Diagnosis not present

## 2017-07-03 DIAGNOSIS — Z85828 Personal history of other malignant neoplasm of skin: Secondary | ICD-10-CM | POA: Diagnosis not present

## 2017-07-03 DIAGNOSIS — Z6841 Body Mass Index (BMI) 40.0 and over, adult: Secondary | ICD-10-CM | POA: Diagnosis not present

## 2017-07-03 DIAGNOSIS — M545 Low back pain: Secondary | ICD-10-CM | POA: Diagnosis not present

## 2017-07-03 DIAGNOSIS — M65322 Trigger finger, left index finger: Secondary | ICD-10-CM | POA: Diagnosis not present

## 2017-07-03 DIAGNOSIS — Z79899 Other long term (current) drug therapy: Secondary | ICD-10-CM | POA: Diagnosis not present

## 2017-07-03 DIAGNOSIS — Q600 Renal agenesis, unilateral: Secondary | ICD-10-CM | POA: Diagnosis not present

## 2017-07-03 DIAGNOSIS — M0589 Other rheumatoid arthritis with rheumatoid factor of multiple sites: Secondary | ICD-10-CM | POA: Diagnosis not present

## 2017-07-06 ENCOUNTER — Ambulatory Visit (INDEPENDENT_AMBULATORY_CARE_PROVIDER_SITE_OTHER): Payer: Medicare Other

## 2017-07-06 DIAGNOSIS — Z23 Encounter for immunization: Secondary | ICD-10-CM | POA: Diagnosis not present

## 2017-07-06 NOTE — Progress Notes (Signed)
Patient in with spouse and received flu shot.

## 2017-07-25 ENCOUNTER — Other Ambulatory Visit: Payer: Self-pay | Admitting: Family Medicine

## 2017-07-26 ENCOUNTER — Ambulatory Visit (INDEPENDENT_AMBULATORY_CARE_PROVIDER_SITE_OTHER): Payer: Medicare Other | Admitting: Pharmacist

## 2017-07-26 DIAGNOSIS — I480 Paroxysmal atrial fibrillation: Secondary | ICD-10-CM

## 2017-07-26 DIAGNOSIS — Z7901 Long term (current) use of anticoagulants: Secondary | ICD-10-CM | POA: Diagnosis not present

## 2017-07-26 DIAGNOSIS — Z5181 Encounter for therapeutic drug level monitoring: Secondary | ICD-10-CM

## 2017-07-26 LAB — POCT INR: INR: 2.6

## 2017-08-08 ENCOUNTER — Other Ambulatory Visit: Payer: Self-pay | Admitting: Cardiology

## 2017-08-11 DIAGNOSIS — M0589 Other rheumatoid arthritis with rheumatoid factor of multiple sites: Secondary | ICD-10-CM | POA: Diagnosis not present

## 2017-09-06 ENCOUNTER — Ambulatory Visit (INDEPENDENT_AMBULATORY_CARE_PROVIDER_SITE_OTHER): Payer: Medicare Other | Admitting: Pharmacist Clinician (PhC)/ Clinical Pharmacy Specialist

## 2017-09-06 DIAGNOSIS — I480 Paroxysmal atrial fibrillation: Secondary | ICD-10-CM | POA: Diagnosis not present

## 2017-09-06 DIAGNOSIS — Z5181 Encounter for therapeutic drug level monitoring: Secondary | ICD-10-CM

## 2017-09-06 LAB — POCT INR: INR: 1.7

## 2017-09-27 ENCOUNTER — Ambulatory Visit (INDEPENDENT_AMBULATORY_CARE_PROVIDER_SITE_OTHER): Payer: Medicare Other | Admitting: Pharmacist

## 2017-09-27 DIAGNOSIS — I480 Paroxysmal atrial fibrillation: Secondary | ICD-10-CM

## 2017-09-27 DIAGNOSIS — Z5181 Encounter for therapeutic drug level monitoring: Secondary | ICD-10-CM

## 2017-09-27 LAB — POCT INR: INR: 1.9

## 2017-10-05 DIAGNOSIS — Z79899 Other long term (current) drug therapy: Secondary | ICD-10-CM | POA: Diagnosis not present

## 2017-10-05 DIAGNOSIS — M0589 Other rheumatoid arthritis with rheumatoid factor of multiple sites: Secondary | ICD-10-CM | POA: Diagnosis not present

## 2017-10-18 DIAGNOSIS — F32A Depression, unspecified: Secondary | ICD-10-CM | POA: Insufficient documentation

## 2017-10-18 DIAGNOSIS — M1990 Unspecified osteoarthritis, unspecified site: Secondary | ICD-10-CM | POA: Insufficient documentation

## 2017-10-18 DIAGNOSIS — K219 Gastro-esophageal reflux disease without esophagitis: Secondary | ICD-10-CM | POA: Insufficient documentation

## 2017-10-18 DIAGNOSIS — I724 Aneurysm of artery of lower extremity: Secondary | ICD-10-CM | POA: Insufficient documentation

## 2017-10-18 DIAGNOSIS — D3000 Benign neoplasm of unspecified kidney: Secondary | ICD-10-CM | POA: Insufficient documentation

## 2017-10-18 DIAGNOSIS — F419 Anxiety disorder, unspecified: Secondary | ICD-10-CM | POA: Insufficient documentation

## 2017-10-18 DIAGNOSIS — I480 Paroxysmal atrial fibrillation: Secondary | ICD-10-CM | POA: Insufficient documentation

## 2017-10-18 DIAGNOSIS — I2510 Atherosclerotic heart disease of native coronary artery without angina pectoris: Secondary | ICD-10-CM | POA: Insufficient documentation

## 2017-10-18 DIAGNOSIS — I10 Essential (primary) hypertension: Secondary | ICD-10-CM | POA: Insufficient documentation

## 2017-10-18 DIAGNOSIS — F329 Major depressive disorder, single episode, unspecified: Secondary | ICD-10-CM | POA: Insufficient documentation

## 2017-10-18 DIAGNOSIS — K859 Acute pancreatitis without necrosis or infection, unspecified: Secondary | ICD-10-CM | POA: Insufficient documentation

## 2017-10-19 ENCOUNTER — Other Ambulatory Visit: Payer: Self-pay

## 2017-10-19 MED ORDER — ESCITALOPRAM OXALATE 10 MG PO TABS: 10.0000 mg | ORAL_TABLET | Freq: Every day | ORAL | 0 refills | Status: DC

## 2017-10-25 ENCOUNTER — Ambulatory Visit (INDEPENDENT_AMBULATORY_CARE_PROVIDER_SITE_OTHER): Payer: Medicare Other | Admitting: Pharmacist

## 2017-10-25 DIAGNOSIS — Z5181 Encounter for therapeutic drug level monitoring: Secondary | ICD-10-CM | POA: Diagnosis not present

## 2017-10-25 DIAGNOSIS — I480 Paroxysmal atrial fibrillation: Secondary | ICD-10-CM

## 2017-10-25 LAB — POCT INR: INR: 1.4

## 2017-10-30 ENCOUNTER — Telehealth: Payer: Self-pay | Admitting: *Deleted

## 2017-10-30 NOTE — Telephone Encounter (Signed)
Received Physician Orders from [facility] for CPAP supplies; forwarded to provider/SLS 01/07

## 2017-11-01 NOTE — Progress Notes (Signed)
HPI: FU coronary artery disease and atrial fibrillation. Last cardiac catheterization on Mar 06, 2007 showed an ejection fraction of 55%. There is nonobstructive plaque in the LAD, and circumflex and the right coronary artery was normal. 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 Also note the patient is extremely sensitive to Coumadin. MRA of the abdomen in September of 2012 showed no left renal artery stenosis and previous right nephrectomy. 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 Echocardiogram in June of 2014 showed normal LV function, grade 1 diastolic dysfunction and mild left atrial enlargement. Carotid Dopplers in March 2017 showed 1-39% stenosis bilaterally. Event monitor January 2018 showed sinus with PACs, PVCs and ectopic atrial tachycardia. Since I last saw her, the patient has dyspnea with more extreme activities but not with routine activities. It is relieved with rest. It is not associated with chest pain. There is no orthopnea, PND or pedal edema. There is no syncope or palpitations. There is no exertional chest pain.    Current Outpatient Medications  Medication Sig Dispense Refill  . acetaminophen (TYLENOL) 325 MG tablet Take 650 mg by mouth every 6 (six) hours as needed.    Marland Kitchen amLODipine (NORVASC) 10 MG tablet Take 1 tablet (10 mg total) by mouth daily. 90 tablet 1  . dofetilide (TIKOSYN) 125 MCG capsule TAKE 3 CAPSULES (375 MCG TOTAL) BY MOUTH 2 (TWO) TIMES DAILY. 540 capsule 1  . escitalopram (LEXAPRO) 10 MG tablet Take 1 tablet (10 mg total) by mouth daily. 90 tablet 0  . fluticasone (FLONASE) 50 MCG/ACT nasal spray Place 2 sprays into both nostrils daily. 16 g 6  . hydrocortisone (ANUSOL-HC) 25 MG suppository UNWRAP AND INSERT 1 SUPPOSITORY RECTALLY AT BEDTIME AS NEEDED FOR HEMORRHOIDS OR ITCHING 12 suppository 1  . InFLIXimab (REMICADE IV) Inject into  the vein as directed.    . loperamide (IMODIUM A-D) 2 MG tablet Take 1 mg by mouth daily as needed. Dihrrhea    . losartan (COZAAR) 100 MG tablet TAKE 1 TABLET (100 MG TOTAL) BY MOUTH DAILY. 90 tablet 3  . metoprolol tartrate (LOPRESSOR) 25 MG tablet Take 1 tablet (25 mg total) by mouth 2 (two) times daily. 180 tablet 2  . Multiple Vitamin (MULTIVITAMIN) capsule Take 1 capsule by mouth daily. 1 daily      . nitroGLYCERIN (NITROSTAT) 0.4 MG SL tablet Place 1 tablet (0.4 mg total) under the tongue every 5 (five) minutes as needed. 1 tab under tongue every 5 min for chest pain not to exceed 3 tabs in 15 min 25 tablet 6  . Probiotic Product ([place]) CAPS Take 1 capsule by mouth daily. 1 per day    . Vitamin D, Cholecalciferol, 1000 units TABS Take by mouth daily.    Marland Kitchen warfarin (COUMADIN) 2.5 MG tablet TAKE 1-1.5 TABLETS BY MOUTH DAILY AS DIRECTED BY COUMADIN CLINIC 120 tablet 0  . hydrALAZINE (APRESOLINE) 50 MG tablet TAKE 1 TABLET (50 MG TOTAL) BY MOUTH 3 (THREE) TIMES DAILY. 60 tablet 6   No current facility-administered medications for this visit.      Past Medical History:  Diagnosis Date  . Anxiety   . Arthritis   . CAD (coronary artery disease)   . Crohn's colitis ([place])    she reports ulcerative colitis beginning in her 4's, biopsies 2008 suggest Crohn's not UC  . Depression   .  GERD (gastroesophageal reflux disease)   . Hyperlipidemia   . Hypertension   . Keloid of skin   . Obesity   . OSA (obstructive sleep apnea)   . Pancreatitis   . Paroxysmal atrial fibrillation (HCC)   . Pseudoaneurysm of right femoral artery ([place])   . Renal oncocytoma    s/p right nephrectomy  . Rheumatoid arthritis ([place]) 05/29/2017    Past Surgical History:  Procedure Laterality Date  . BREAST BIOPSY  1996   benign lesion   . Cataract surgery Left 11/13/14  . CHOLECYSTECTOMY  10/2006  . COLONOSCOPY W/ BIOPSIES    . CORONARY ANGIOPLASTY    . ESOPHAGOGASTRODUODENOSCOPY    .  NEPHRECTOMY  10/2006   right secondary to oncocytoma   . RIGHT FEMORAL  PSEUDOANEURYSM & RIGHT GROIN HEMATOMA EVACUATION  02/2007  . RIGHT RENAL ARTERY REPAIR  1976  . TUBAL LIGATION      Social History   Socioeconomic History  . Marital status: Married    Spouse name: Not on file  . Number of children: 3  . Years of education: Not on file  . Highest education level: Not on file  Social Needs  . Financial resource strain: Not on file  . Food insecurity - worry: Not on file  . Food insecurity - inability: Not on file  . Transportation needs - medical: Not on file  . Transportation needs - non-medical: Not on file  Occupational History  . Occupation: Retired    Fish farm manager: RETIRED    Comment: retired  Tobacco Use  . Smoking status: Never Smoker  . Smokeless tobacco: Never Used  Substance and Sexual Activity  . Alcohol use: No  . Drug use: No  . Sexual activity: Not Currently  Other Topics Concern  . 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 Colitis Father   . Heart disease Father   . Hypertension Mother   . Coronary artery disease Mother   . Stroke Mother   . Clotting disorder Mother   . Heart disease Mother   . Allergies Mother   . Stroke Brother   . Colon cancer Paternal Aunt   . Clotting disorder Brother   . Clotting disorder Sister   . Asthma Daughter   . Asthma Maternal Uncle   . Colon polyps Neg Hx   . Kidney disease Neg Hx     ROS: no fevers or chills, productive cough, hemoptysis, dysphasia, odynophagia, melena, hematochezia, dysuria, hematuria, rash, seizure activity, orthopnea, PND, pedal edema, claudication. Remaining systems are negative.  Physical Exam: Well-developed obese in no acute distress.  Skin is warm and dry.  HEENT is normal.  Neck is supple.  Chest is  clear to auscultation with normal expansion.  Cardiovascular exam is regular rate and rhythm.  Abdominal exam nontender or distended. No masses palpated. Extremities show no edema. neuro grossly intact  ECG- sinus rhythm at a rate of 59.no ST changes. Normal QT interval.personally reviewed  A/P  1 paroxysmal atrial fibrillation-patient is in sinus rhythm today. Continue tikosyn for rhythm control. Continue Coumadin. Her symptoms continue to be reasonably well-controlled with antiarrhythmic therapy. Check renal function, potassium and magnesium. Check hemoglobin.  2 coronary artery disease-patient is not on aspirin given need for Coumadin. She is intolerant  to statins.  3 hypertension-blood pressure is controlled today. Continue present medications and follow.  4 hyperlipidemia-intolerant to statins. Continue diet.  5 carotid artery disease-most recent Doppler showed 1-39% bilateral stenosis.  Kirk Ruths, MD

## 2017-11-02 ENCOUNTER — Other Ambulatory Visit: Payer: Self-pay

## 2017-11-02 MED ORDER — AMLODIPINE BESYLATE 10 MG PO TABS: 10.0000 mg | ORAL_TABLET | Freq: Every day | ORAL | 1 refills | Status: DC

## 2017-11-02 NOTE — Telephone Encounter (Signed)
Rx(s) sent to pharmacy electronically.  

## 2017-11-06 ENCOUNTER — Other Ambulatory Visit: Payer: Self-pay | Admitting: Family Medicine

## 2017-11-06 DIAGNOSIS — M0589 Other rheumatoid arthritis with rheumatoid factor of multiple sites: Secondary | ICD-10-CM | POA: Diagnosis not present

## 2017-11-06 DIAGNOSIS — K5090 Crohn's disease, unspecified, without complications: Secondary | ICD-10-CM | POA: Diagnosis not present

## 2017-11-06 DIAGNOSIS — K649 Unspecified hemorrhoids: Secondary | ICD-10-CM

## 2017-11-06 DIAGNOSIS — L821 Other seborrheic keratosis: Secondary | ICD-10-CM | POA: Diagnosis not present

## 2017-11-06 DIAGNOSIS — Q600 Renal agenesis, unilateral: Secondary | ICD-10-CM | POA: Diagnosis not present

## 2017-11-06 DIAGNOSIS — M545 Low back pain: Secondary | ICD-10-CM | POA: Diagnosis not present

## 2017-11-06 DIAGNOSIS — Z79899 Other long term (current) drug therapy: Secondary | ICD-10-CM | POA: Diagnosis not present

## 2017-11-06 DIAGNOSIS — L820 Inflamed seborrheic keratosis: Secondary | ICD-10-CM | POA: Diagnosis not present

## 2017-11-06 DIAGNOSIS — Z23 Encounter for immunization: Secondary | ICD-10-CM | POA: Diagnosis not present

## 2017-11-06 DIAGNOSIS — Z6841 Body Mass Index (BMI) 40.0 and over, adult: Secondary | ICD-10-CM | POA: Diagnosis not present

## 2017-11-06 DIAGNOSIS — Z85828 Personal history of other malignant neoplasm of skin: Secondary | ICD-10-CM | POA: Diagnosis not present

## 2017-11-06 DIAGNOSIS — M65322 Trigger finger, left index finger: Secondary | ICD-10-CM | POA: Diagnosis not present

## 2017-11-06 DIAGNOSIS — H209 Unspecified iridocyclitis: Secondary | ICD-10-CM | POA: Diagnosis not present

## 2017-11-06 DIAGNOSIS — K625 Hemorrhage of anus and rectum: Secondary | ICD-10-CM

## 2017-11-07 ENCOUNTER — Encounter: Payer: Self-pay | Admitting: Cardiology

## 2017-11-07 ENCOUNTER — Ambulatory Visit (INDEPENDENT_AMBULATORY_CARE_PROVIDER_SITE_OTHER): Payer: Medicare Other | Admitting: Cardiology

## 2017-11-07 ENCOUNTER — Ambulatory Visit (INDEPENDENT_AMBULATORY_CARE_PROVIDER_SITE_OTHER): Payer: Medicare Other | Admitting: Pharmacist

## 2017-11-07 VITALS — BP 140/72 | HR 59 | Ht 63.0 in | Wt 242.0 lb

## 2017-11-07 DIAGNOSIS — I480 Paroxysmal atrial fibrillation: Secondary | ICD-10-CM | POA: Diagnosis not present

## 2017-11-07 DIAGNOSIS — I10 Essential (primary) hypertension: Secondary | ICD-10-CM

## 2017-11-07 DIAGNOSIS — I2510 Atherosclerotic heart disease of native coronary artery without angina pectoris: Secondary | ICD-10-CM | POA: Diagnosis not present

## 2017-11-07 DIAGNOSIS — Z5181 Encounter for therapeutic drug level monitoring: Secondary | ICD-10-CM

## 2017-11-07 DIAGNOSIS — E7800 Pure hypercholesterolemia, unspecified: Secondary | ICD-10-CM

## 2017-11-07 LAB — BASIC METABOLIC PANEL
BUN/Creatinine Ratio: 17 (ref 12–28)
BUN: 17 mg/dL (ref 8–27)
CO2: 22 mmol/L (ref 20–29)
Calcium: 9.2 mg/dL (ref 8.7–10.3)
Chloride: 102 mmol/L (ref 96–106)
Creatinine, Ser: 1.03 mg/dL — ABNORMAL HIGH (ref 0.57–1.00)
GFR calc Af Amer: 63 mL/min/{1.73_m2} (ref >59)
GFR calc non Af Amer: 54 mL/min/{1.73_m2} — ABNORMAL LOW (ref >59)
Glucose: 93 mg/dL (ref 65–99)
Potassium: 4.4 mmol/L (ref 3.5–5.2)
Sodium: 139 mmol/L (ref 134–144)

## 2017-11-07 LAB — CBC
HEMOGLOBIN: 13.3 g/dL (ref 11.1–15.9)
Hematocrit: 40 % (ref 34.0–46.6)
MCH: 31.9 pg (ref 26.6–33.0)
MCHC: 33.3 g/dL (ref 31.5–35.7)
MCV: 96 fL (ref 79–97)
Platelets: 238 10*3/uL (ref 150–379)
RBC: 4.17 x10E6/uL (ref 3.77–5.28)
RDW: 13.5 % (ref 12.3–15.4)
WBC: 7.7 10*3/uL (ref 3.4–10.8)

## 2017-11-07 LAB — MAGNESIUM: MAGNESIUM: 2.1 mg/dL (ref 1.6–2.3)

## 2017-11-07 LAB — POCT INR: INR: 1.7

## 2017-11-07 NOTE — Patient Instructions (Signed)

## 2017-11-10 ENCOUNTER — Telehealth: Payer: Self-pay | Admitting: Pulmonary Disease

## 2017-11-10 DIAGNOSIS — G4733 Obstructive sleep apnea (adult) (pediatric): Secondary | ICD-10-CM

## 2017-11-10 NOTE — Telephone Encounter (Signed)
Spoke with pt. She is needing CPAP supplies. Order has been placed. Nothing further was needed.

## 2017-11-17 ENCOUNTER — Other Ambulatory Visit: Payer: Self-pay | Admitting: Cardiology

## 2017-11-17 NOTE — Telephone Encounter (Signed)
Rx has been sent to the pharmacy electronically. ° °

## 2017-11-21 ENCOUNTER — Ambulatory Visit (INDEPENDENT_AMBULATORY_CARE_PROVIDER_SITE_OTHER): Payer: Medicare Other | Admitting: Pharmacist

## 2017-11-21 DIAGNOSIS — I480 Paroxysmal atrial fibrillation: Secondary | ICD-10-CM | POA: Diagnosis not present

## 2017-11-21 DIAGNOSIS — Z5181 Encounter for therapeutic drug level monitoring: Secondary | ICD-10-CM | POA: Diagnosis not present

## 2017-11-21 LAB — POCT INR: INR: 1.9

## 2017-11-24 ENCOUNTER — Other Ambulatory Visit: Payer: Self-pay | Admitting: Cardiology

## 2017-11-29 NOTE — Progress Notes (Signed)
Subjective:   [name] is a 72 y.o. female who presents for Medicare Annual (Subsequent) preventive examination.  Review of Systems:  No ROS.  Medicare Wellness Visit. Additional risk factors are reflected in the social history. Cardiac Risk Factors include: advanced age (>75men, >49 women);diabetes mellitus;dyslipidemia;hypertension;obesity (BMI >30kg/m2);sedentary lifestyle Sleep patterns:  Sleeps 8-9 hrs. Feels rested. Home Safety/Smoke Alarms: Feels safe in home. Smoke alarms in place.  Living environment; residence and Firearm Safety: Lives with husband. 1 story ranch.  Seat Belt Safety/Bike Helmet: Wears seat belt.   Female:   Pap- last 08/17/12-normal. No longer doing routine screening due to age.     Mammo- last 08/25/16: BI-RADS CATEGORY  1: Negative. ORDERED    Dexa scan-  Last 06/05/17:  osteoporosis     CCS-  Last 09/10/12. Recall 2 year. Pt declines at this time. States she will let us know when she is ready or she will call Dr.Gessner herself.  Dr.Havalin for dentist visits yearly. Objective:     Vitals: BP 124/63 (BP Location: Left Wrist, Patient Position: Sitting, Cuff Size: Normal)   Pulse 97   Ht 5\' 3"  (1.6 m)   Wt 241 lb (109.3 kg)   SpO2 (!) 50%   BMI 42.69 kg/m   Body mass index is 42.69 kg/m.  Advanced Directives 11/30/2017 12/14/2015 08/28/2014  Does Patient Have a Medical Advance Directive? No No No  Would patient like information on creating a medical advance directive? No - Patient declined No - patient declined information Yes Higher education careers adviser given    Tobacco Social History   Tobacco Use  Smoking Status Never Smoker  Smokeless Tobacco Never Used     Counseling given: Not Answered   Clinical Intake: Pain : No/denies pain    Past Medical History:  Diagnosis Date  . Anxiety   . Arthritis   . CAD (coronary artery disease)   . Crohn's colitis ([place])    she reports ulcerative colitis beginning in her 8's, biopsies 2008 suggest  Crohn's not UC  . Depression   . GERD (gastroesophageal reflux disease)   . Hyperlipidemia   . Hypertension   . Keloid of skin   . Obesity   . OSA (obstructive sleep apnea)   . Pancreatitis   . Paroxysmal atrial fibrillation ([facility])   . 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 Renal oncocytoma    s/p right nephrectomy  . Rheumatoid arthritis ([place]) 05/29/2017   Past Surgical History:  Procedure Laterality Date  . BREAST BIOPSY  1996   benign lesion   . Cataract surgery Left 11/13/14  . CHOLECYSTECTOMY  10/2006  . COLONOSCOPY W/ BIOPSIES    . CORONARY ANGIOPLASTY    . ESOPHAGOGASTRODUODENOSCOPY    . NEPHRECTOMY  10/2006   right secondary to oncocytoma   . RIGHT FEMORAL  PSEUDOANEURYSM & RIGHT GROIN HEMATOMA EVACUATION  02/2007  . RIGHT RENAL ARTERY REPAIR  1976  . TUBAL LIGATION     Family History  Problem Relation Age of Onset  . Colitis Father   . Heart disease Father   . Hypertension Mother   . Coronary artery disease Mother   . Stroke Mother   . Clotting disorder Mother   . Heart disease Mother   . Allergies Mother   . Stroke Brother   . Colon cancer Paternal Aunt   . Clotting disorder Brother   . Clotting disorder Sister   . Asthma Daughter   . Asthma Maternal Uncle   .  Colon polyps Neg Hx   . Kidney disease Neg Hx    Social History   Socioeconomic History  . Marital status: Married    Spouse name: None  . Number of children: 3  . Years of education: None  . Highest education level: None  Social Needs  . Financial resource strain: None  . Food insecurity - worry: None  . Food insecurity - inability: None  . Transportation needs - medical: None  . Transportation needs - non-medical: None  Occupational History  . Occupation: Retired    Fish farm manager: RETIRED    Comment: retired  Tobacco Use  . Smoking status: Never Smoker  . Smokeless tobacco: Never Used  Substance and Sexual Activity  . Alcohol use: No  . Drug use: No  . Sexual activity: No    Other Topics Concern  . 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 acetaminophen (TYLENOL) 325 MG tablet Take 650 mg by mouth every 6 (six) hours as needed.  Marland Kitchen amLODipine (NORVASC) 10 MG tablet Take 1 tablet (10 mg total) by mouth daily.  Marland Kitchen dofetilide (TIKOSYN) 125 MCG capsule TAKE 3 CAPSULES BY MOUTH TWICE A DAY  . escitalopram (LEXAPRO) 10 MG tablet Take 1 tablet (10 mg total) by mouth daily.  . hydrALAZINE (APRESOLINE) 50 MG tablet Take 1 tablet (50 mg total) by mouth 3 (three) times daily.  . hydrocortisone (ANUSOL-HC) 25 MG suppository UNWRAP AND INSERT 1 SUPPOSITORY RECTALLY AT BEDTIME AS NEEDED FOR HEMORRHOIDS OR ITCHING  . InFLIXimab (REMICADE IV) Inject into the vein as directed.  . loperamide (IMODIUM A-D) 2 MG tablet Take 1 mg by mouth daily as needed. Dihrrhea  . losartan (COZAAR) 100 MG tablet TAKE 1 TABLET (100 MG TOTAL) BY MOUTH DAILY.  . metoprolol tartrate (LOPRESSOR) 25 MG tablet Take 1 tablet (25 mg total) by mouth 2 (two) times daily.  . Multiple Vitamin (MULTIVITAMIN) capsule Take 1 capsule by mouth daily. 1 daily    . nitroGLYCERIN (NITROSTAT) 0.4 MG SL tablet Place 1 tablet (0.4 mg total) under the tongue every 5 (five) minutes as needed. 1 tab under tongue every 5 min for chest pain not to exceed 3 tabs in 15 min  . Probiotic Product ([place]) CAPS Take 1 capsule by mouth daily. 1 per day  . Vitamin D, Cholecalciferol, 1000 units TABS Take by mouth daily.  Marland Kitchen warfarin (COUMADIN) 2.5 MG tablet TAKE 1-1.5 TABLETS BY MOUTH DAILY AS DIRECTED BY COUMADIN CLINIC  . fluticasone (FLONASE) 50 MCG/ACT nasal spray Place 2 sprays into both nostrils daily. (Patient not taking: Reported on 11/30/2017)   No facility-administered encounter medications on file as of 11/30/2017.      Activities of Daily Living In your present state of health, do you have any difficulty performing the following activities: 11/30/2017  Hearing? Y  Comment wearing hearing aids. Sees Roney Mans at [employer].  Vision? N  Comment wearing glasses. Dr.Digby every 6 months.   Difficulty concentrating or making decisions? N  Walking or climbing stairs? Y  Comment Can't due to RA  Dressing or bathing? N  Doing errands, shopping? N  Preparing Food and eating ? N  Using the Toilet? N  In the past six months, have you accidently leaked urine?  Y  Comment wears pads.  Do you have problems with loss of bowel control? Y  Comment [place]  Managing your Medications? N  Managing your Finances? N  Housekeeping or managing your Housekeeping? Y  Comment has someone come to clean once per month.  Some recent data might be hidden    Patient Care Team: [name], MD as PCP - General (Family Medicine) Stanford Breed, Denice Bors, MD as PCP - Cardiology (Cardiology) Gatha Mayer, MD as Consulting Physician (Gastroenterology) Hollar, Katharine Look, MD as Referring Physician (Dermatology) Rigoberto Noel, MD as Consulting Physician (Pulmonary Disease) Calvert Cantor, MD as Consulting Physician (Ophthalmology) Sherlynn Stalls, MD as Consulting Physician (Ophthalmology) Hennie Duos, MD as Consulting Physician (Rheumatology)    Assessment:   This is a routine wellness examination for [name]. Physical assessment deferred to PCP.   Exercise Activities and Dietary recommendations Current Exercise Habits: The patient does not participate in regular exercise at present   Diet (meal preparation, eat out, water intake, caffeinated beverages, dairy products, fruits and vegetables): Pt states she tries to eat a well balanced diet. States it is difficult due to her crohns and coumadin. Reports drinking plenty of water.   Goals    . Increase physical activity     Walking        Fall  Risk Fall Risk  11/30/2017 05/29/2017 12/14/2015 08/31/2015 08/28/2014  Falls in the past year? No No No No No   Depression Screen PHQ 2/9 Scores 11/30/2017 05/29/2017 12/14/2015 08/31/2015  PHQ - 2 Score 1 0 0 4  PHQ- 9 Score - - - 8     Cognitive Function MMSE - Mini Mental State Exam 11/30/2017 12/14/2015  Orientation to time 5 5  Orientation to Place 5 5  Registration 3 3  Attention/ Calculation 5 5  Recall 3 3  Language- name 2 objects 2 2  Language- repeat 1 1  Language- follow 3 step command 3 3  Language- read & follow direction 1 1  Write a sentence 1 1  Copy design 1 1  Total score 30 30        Immunization History  Administered Date(s) Administered  . Influenza Split 07/23/2012  . Influenza Whole 09/06/2007, 08/11/2008, 08/24/2009, 08/03/2010  . Influenza, High Dose Seasonal PF 08/02/2013, 06/29/2016, 07/06/2017  . Influenza,inj,Quad PF,6+ Mos 06/27/2014, 06/26/2015  . Pneumococcal Conjugate-13 08/28/2014  . Pneumococcal Polysaccharide-23 09/13/2010  . Td 05/12/2009  . Zoster 02/11/2008    Screening Tests Health Maintenance  Topic Date Due  . Hepatitis C Screening  08/27/1946  . FOOT EXAM  08/22/1955  . OPHTHALMOLOGY EXAM  08[birth year]22/1955  . COLONOSCOPY  09/10/2014  . HEMOGLOBIN A1C  12/21/2016  . MAMMOGRAM  08/25/2018  . TETANUS/TDAP  05/13/2019  . INFLUENZA VACCINE  Completed  . DEXA SCAN  Completed  . PNA vac Low Risk Adult  Completed      Plan:   Follow up with Dr.Blyth today as scheduled  Continue to eat heart healthy diet (full of fruits, vegetables, whole grains, lean protein, water--limit salt, fat, and sugar intake) and increase physical activity as tolerated.  Continue doing brain stimulating activities (puzzles, reading, adult coloring books, staying active) to keep memory sharp.    Bring a copy of your living will and/or healthcare power of attorney to your next office visit.   I have personally reviewed and noted the following in the patient's  chart:   . Medical and social history . Use of alcohol, tobacco or  illicit drugs  . Current medications and supplements . Functional ability and status . Nutritional status . Physical activity . Advanced directives . List of other physicians . Hospitalizations, surgeries, and ER visits in previous 12 months . Vitals . Screenings to include cognitive, depression, and falls . Referrals and appointments  In addition, I have reviewed and discussed with patient certain preventive protocols, quality metrics, and best practice recommendations. A written personalized care plan for preventive services as well as general preventive health recommendations were provided to patient.     Shela Nevin, [place]  11/30/2017

## 2017-11-30 ENCOUNTER — Ambulatory Visit (HOSPITAL_BASED_OUTPATIENT_CLINIC_OR_DEPARTMENT_OTHER)
Admission: RE | Admit: 2017-11-30 | Discharge: 2017-11-30 | Disposition: A | Discharge status: Home / Self Care | Payer: Medicare Other | Source: Ambulatory Visit | Attending: Family Medicine | Admitting: Family Medicine

## 2017-11-30 ENCOUNTER — Ambulatory Visit (INDEPENDENT_AMBULATORY_CARE_PROVIDER_SITE_OTHER): Payer: Medicare Other | Admitting: Family Medicine

## 2017-11-30 ENCOUNTER — Encounter: Payer: Self-pay | Admitting: *Deleted

## 2017-11-30 ENCOUNTER — Ambulatory Visit: Payer: Medicare Other | Admitting: *Deleted

## 2017-11-30 VITALS — BP 124/63 | HR 97 | Ht 63.0 in | Wt 241.0 lb

## 2017-11-30 DIAGNOSIS — Z1239 Encounter for other screening for malignant neoplasm of breast: Secondary | ICD-10-CM

## 2017-11-30 DIAGNOSIS — R739 Hyperglycemia, unspecified: Secondary | ICD-10-CM

## 2017-11-30 DIAGNOSIS — G4733 Obstructive sleep apnea (adult) (pediatric): Secondary | ICD-10-CM

## 2017-11-30 DIAGNOSIS — I10 Essential (primary) hypertension: Secondary | ICD-10-CM | POA: Diagnosis not present

## 2017-11-30 DIAGNOSIS — I2510 Atherosclerotic heart disease of native coronary artery without angina pectoris: Secondary | ICD-10-CM

## 2017-11-30 DIAGNOSIS — M810 Age-related osteoporosis without current pathological fracture: Secondary | ICD-10-CM

## 2017-11-30 DIAGNOSIS — Z0000 Encounter for general adult medical examination without abnormal findings: Secondary | ICD-10-CM

## 2017-11-30 DIAGNOSIS — Z1231 Encounter for screening mammogram for malignant neoplasm of breast: Secondary | ICD-10-CM | POA: Diagnosis not present

## 2017-11-30 DIAGNOSIS — E7800 Pure hypercholesterolemia, unspecified: Secondary | ICD-10-CM | POA: Diagnosis not present

## 2017-11-30 LAB — CBC WITH DIFFERENTIAL/PLATELET
BASOS PCT: 0.8 % (ref 0.0–3.0)
Basophils Absolute: 0.1 10*3/uL (ref 0.0–0.1)
EOS ABS: 0.1 10*3/uL (ref 0.0–0.7)
Eosinophils Relative: 1.5 % (ref 0.0–5.0)
HEMATOCRIT: 41.9 % (ref 36.0–46.0)
Hemoglobin: 14 g/dL (ref 12.0–15.0)
Lymphocytes Relative: 23.8 % (ref 12.0–46.0)
Lymphs Abs: 1.6 10*3/uL (ref 0.7–4.0)
MCHC: 33.4 g/dL (ref 30.0–36.0)
MCV: 95.6 fl (ref 78.0–100.0)
MONO ABS: 0.6 10*3/uL (ref 0.1–1.0)
Monocytes Relative: 9.3 % (ref 3.0–12.0)
NEUTROS ABS: 4.3 10*3/uL (ref 1.4–7.7)
Neutrophils Relative %: 64.6 % (ref 43.0–77.0)
PLATELETS: 208 10*3/uL (ref 150.0–400.0)
RBC: 4.38 Mil/uL (ref 3.87–5.11)
RDW: 13.7 % (ref 11.5–15.5)
WBC: 6.7 10*3/uL (ref 4.0–10.5)

## 2017-11-30 LAB — COMPREHENSIVE METABOLIC PANEL
ALBUMIN: 3.8 g/dL (ref 3.5–5.2)
ALT: 19 U/L (ref 0–35)
AST: 20 U/L (ref 0–37)
Alkaline Phosphatase: 54 U/L (ref 39–117)
BUN: 16 mg/dL (ref 6–23)
CALCIUM: 9.2 mg/dL (ref 8.4–10.5)
CHLORIDE: 104 meq/L (ref 96–112)
CO2: 27 mEq/L (ref 19–32)
CREATININE: 1.08 mg/dL (ref 0.40–1.20)
GFR: 52.96 mL/min — ABNORMAL LOW (ref >60.00)
Glucose, Bld: 95 mg/dL (ref 70–99)
Potassium: 4.2 mEq/L (ref 3.5–5.1)
SODIUM: 139 meq/L (ref 135–145)
Total Bilirubin: 0.6 mg/dL (ref 0.2–1.2)
Total Protein: 7.5 g/dL (ref 6.0–8.3)

## 2017-11-30 LAB — LIPID PANEL
CHOL/HDL RATIO: 4
CHOLESTEROL: 154 mg/dL (ref 0–200)
HDL: 42.3 mg/dL (ref >39.00)
LDL CALC: 85 mg/dL (ref 0–99)
NonHDL: 111.85
TRIGLYCERIDES: 136 mg/dL (ref 0.0–149.0)
VLDL: 27.2 mg/dL (ref 0.0–40.0)

## 2017-11-30 LAB — VITAMIN D 25 HYDROXY (VIT D DEFICIENCY, FRACTURES): VITD: 46.73 ng/mL (ref 30.00–100.00)

## 2017-11-30 LAB — TSH: TSH: 2.52 u[IU]/mL (ref 0.35–4.50)

## 2017-11-30 LAB — HEMOGLOBIN A1C: Hgb A1c MFr Bld: 5.6 % (ref 4.6–6.5)

## 2017-11-30 NOTE — Assessment & Plan Note (Signed)
Encouraged to get adequate exercise, calcium and vitamin d intake, check Vitamin D and supplement

## 2017-11-30 NOTE — Assessment & Plan Note (Signed)
hgba1c acceptable, minimize simple carbs. Increase exercise as tolerated.  

## 2017-11-30 NOTE — Patient Instructions (Addendum)
Shingrix is the new shingles shot, 2 shots over 2-6 months, at the pharmacy Hypertension Hypertension is another name for high blood pressure. High blood pressure forces your heart to work harder to pump blood. This can cause problems over time. There are two numbers in a blood pressure reading. There is a top number (systolic) over a bottom number (diastolic). It is best to have a blood pressure below 120/80. Healthy choices can help lower your blood pressure. You may need medicine to help lower your blood pressure if:  Your blood pressure cannot be lowered with healthy choices.  Your blood pressure is higher than 130/80.  Follow these instructions at home: Eating and drinking  If directed, follow the DASH eating plan. This diet includes: ? Filling half of your plate at each meal with fruits and vegetables. ? Filling one quarter of your plate at each meal with whole grains. Whole grains include whole wheat pasta, brown rice, and whole grain bread. ? Eating or drinking low-fat dairy products, such as skim milk or low-fat yogurt. ? Filling one quarter of your plate at each meal with low-fat (lean) proteins. Low-fat proteins include fish, skinless chicken, eggs, beans, and tofu. ? Avoiding fatty meat, cured and processed meat, or chicken with skin. ? Avoiding premade or processed food.  Eat less than 1,500 mg of salt (sodium) a day.  Limit alcohol use to no more than 1 drink a day for nonpregnant women and 2 drinks a day for men. One drink equals 12 oz of beer, 5 oz of wine, or 1 oz of hard liquor. Lifestyle  Work with your doctor to stay at a healthy weight or to lose weight. Ask your doctor what the best weight is for you.  Get at least 30 minutes of exercise that causes your heart to beat faster (aerobic exercise) most days of the week. This may include walking, swimming, or biking.  Get at least 30 minutes of exercise that strengthens your muscles (resistance exercise) at least 3 days  a week. This may include lifting weights or pilates.  Do not use any products that contain nicotine or tobacco. This includes cigarettes and e-cigarettes. If you need help quitting, ask your doctor.  Check your blood pressure at home as told by your doctor.  Keep all follow-up visits as told by your doctor. This is important. Medicines  Take over-the-counter and prescription medicines only as told by your doctor. Follow directions carefully.  Do not skip doses of blood pressure medicine. The medicine does not work as well if you skip doses. Skipping doses also puts you at risk for problems.  Ask your doctor about side effects or reactions to medicines that you should watch for. Contact a doctor if:  You think you are having a reaction to the medicine you are taking.  You have headaches that keep coming back (recurring).  You feel dizzy.  You have swelling in your ankles.  You have trouble with your vision. Get help right away if:  You get a very bad headache.  You start to feel confused.  You feel weak or numb.  You feel faint.  You get very bad pain in your: ? Chest. ? Belly (abdomen).  You throw up (vomit) more than once.  You have trouble breathing. Summary  Hypertension is another name for high blood pressure.  Making healthy choices can help lower blood pressure. If your blood pressure cannot be controlled with healthy choices, you may need to take medicine. This  information is not intended to replace advice given to you by your health care provider. Make sure you discuss any questions you have with your health care provider. Document Released: 03/28/2008 Document Revised: 09/07/2016 Document Reviewed: 09/07/2016 Elsevier Interactive Patient Education  2018 [place].   Bone density shows osteopenia, which is thinner than normal but not as bad as osteoporosis. Recommend calcium intake of 1200 to 1500 mg daily, divided into roughly 3 doses. Best source is  the diet and a single dairy serving is about 500 mg, a supplement of calcium citrate once or twice daily to balance diet is fine if not getting enough in diet. Also need Vitamin D 2000 IU caps, 1 cap daily if not already taking vitamin D. Also recommend weight baring exercise on hips and upper body to keep bones strong

## 2017-11-30 NOTE — Assessment & Plan Note (Signed)
Well controlled, no changes to meds. Encouraged heart healthy diet such as the DASH diet and exercise as tolerated.  °

## 2017-11-30 NOTE — Assessment & Plan Note (Signed)
Encouraged heart healthy diet, increase exercise, avoid trans fats, consider a krill oil cap daily 

## 2017-11-30 NOTE — Assessment & Plan Note (Signed)
Doing well on CPAP

## 2017-11-30 NOTE — Patient Instructions (Addendum)
Continue to eat heart healthy diet (full of fruits, vegetables, whole grains, lean protein, water--limit salt, fat, and sugar intake) and increase physical activity as tolerated.  Continue doing brain stimulating activities (puzzles, reading, adult coloring books, staying active) to keep memory sharp.    Bring a copy of your living will and/or healthcare power of attorney to your next office visit.   [name] , Thank you for taking time to come for your Medicare Wellness Visit. I appreciate your ongoing commitment to your health goals. Please review the following plan we discussed and let me know if I can assist you in the future.   These are the goals we discussed: Goals    . Increase physical activity     Walking        This is a list of the screening recommended for you and due dates:  Health Maintenance  Topic Date Due  .  Hepatitis C: One time screening is recommended by Center for Disease Control  (CDC) for  adults born from [birth year] through 1966.   May 09, 1946  . Complete foot exam   08/22/1955  . Eye exam for diabetics  08/22/1955  . Colon Cancer Screening  09/10/2014  . Hemoglobin A1C  12/21/2016  . Mammogram  08/25/2018  . Tetanus Vaccine  05/13/2019  . Flu Shot  Completed  . DEXA scan (bone density measurement)  Completed  . Pneumonia vaccines  Completed    Health Maintenance for Postmenopausal Women Menopause is a normal process in which your reproductive ability comes to an end. This process happens gradually over a span of months to years, usually between the ages of 20 and 61. Menopause is complete when you have missed 12 consecutive menstrual periods. It is important to talk with your health care provider about some of the most common conditions that affect postmenopausal women, such as heart disease, cancer, and bone loss (osteoporosis). Adopting a healthy lifestyle and getting preventive care can help to promote your health and wellness. Those actions can also  lower your chances of developing some of these common conditions. What should I know about menopause? During menopause, you may experience a number of symptoms, such as:  Moderate-to-severe hot flashes.  Night sweats.  Decrease in sex drive.  Mood swings.  Headaches.  Tiredness.  Irritability.  Memory problems.  Insomnia.  Choosing to treat or not to treat menopausal changes is an individual decision that you make with your health care provider. What should I know about hormone replacement therapy and supplements? Hormone therapy products are effective for treating symptoms that are associated with menopause, such as hot flashes and night sweats. Hormone replacement carries certain risks, especially as you become older. If you are thinking about using estrogen or estrogen with progestin treatments, discuss the benefits and risks with your health care provider. What should I know about heart disease and stroke? Heart disease, heart attack, and stroke become more likely as you age. This may be due, in part, to the hormonal changes that your body experiences during menopause. These can affect how your body processes dietary fats, triglycerides, and cholesterol. Heart attack and stroke are both medical emergencies. There are many things that you can do to help prevent heart disease and stroke:  Have your blood pressure checked at least every 1-2 years. High blood pressure causes heart disease and increases the risk of stroke.  If you are 53-33 years old, ask your health care provider if you should take aspirin to prevent a  heart attack or a stroke.  Do not use any tobacco products, including cigarettes, chewing tobacco, or electronic cigarettes. If you need help quitting, ask your health care provider.  It is important to eat a healthy diet and maintain a healthy weight. ? Be sure to include plenty of vegetables, fruits, low-fat dairy products, and lean protein. ? Avoid eating foods  that are high in solid fats, added sugars, or salt (sodium).  Get regular exercise. This is one of the most important things that you can do for your health. ? Try to exercise for at least 150 minutes each week. The type of exercise that you do should increase your heart rate and make you sweat. This is known as moderate-intensity exercise. ? Try to do strengthening exercises at least twice each week. Do these in addition to the moderate-intensity exercise.  Know your numbers.Ask your health care provider to check your cholesterol and your blood glucose. Continue to have your blood tested as directed by your health care provider.  What should I know about cancer screening? There are several types of cancer. Take the following steps to reduce your risk and to catch any cancer development as early as possible. Breast Cancer  Practice breast self-awareness. ? This means understanding how your breasts normally appear and feel. ? It also means doing regular breast self-exams. Let your health care provider know about any changes, no matter how small.  If you are 35 or older, have a clinician do a breast exam (clinical breast exam or CBE) every year. Depending on your age, family history, and medical history, it may be recommended that you also have a yearly breast X-ray (mammogram).  If you have a family history of breast cancer, talk with your health care provider about genetic screening.  If you are at high risk for breast cancer, talk with your health care provider about having an MRI and a mammogram every year.  Breast cancer (BRCA) gene test is recommended for women who have family members with BRCA-related cancers. Results of the assessment will determine the need for genetic counseling and BRCA1 and for BRCA2 testing. BRCA-related cancers include these types: ? Breast. This occurs in males or females. ? Ovarian. ? Tubal. This may also be called fallopian tube cancer. ? Cancer of the  abdominal or pelvic lining (peritoneal cancer). ? Prostate. ? Pancreatic.  Cervical, Uterine, and Ovarian Cancer Your health care provider may recommend that you be screened regularly for cancer of the pelvic organs. These include your ovaries, uterus, and vagina. This screening involves a pelvic exam, which includes checking for microscopic changes to the surface of your cervix (Pap test).  For women ages 21-65, health care providers may recommend a pelvic exam and a Pap test every three years. For women ages 46-65, they may recommend the Pap test and pelvic exam, combined with testing for human papilloma virus (HPV), every five years. Some types of HPV increase your risk of cervical cancer. Testing for HPV may also be done on women of any age who have unclear Pap test results.  Other health care providers may not recommend any screening for nonpregnant women who are considered low risk for pelvic cancer and have no symptoms. Ask your health care provider if a screening pelvic exam is right for you.  If you have had past treatment for cervical cancer or a condition that could lead to cancer, you need Pap tests and screening for cancer for at least 20 years after your treatment.  If Pap tests have been discontinued for you, your risk factors (such as having a new sexual partner) need to be reassessed to determine if you should start having screenings again. Some women have medical problems that increase the chance of getting cervical cancer. In these cases, your health care provider may recommend that you have screening and Pap tests more often.  If you have a family history of uterine cancer or ovarian cancer, talk with your health care provider about genetic screening.  If you have vaginal bleeding after reaching menopause, tell your health care provider.  There are currently no reliable tests available to screen for ovarian cancer.  Lung Cancer Lung cancer screening is recommended for adults  71-28 years old who are at high risk for lung cancer because of a history of smoking. A yearly low-dose CT scan of the lungs is recommended if you:  Currently smoke.  Have a history of at least 30 pack-years of smoking and you currently smoke or have quit within the past 15 years. A pack-year is smoking an average of one pack of cigarettes per day for one year.  Yearly screening should:  Continue until it has been 15 years since you quit.  Stop if you develop a health problem that would prevent you from having lung cancer treatment.  Colorectal Cancer  This type of cancer can be detected and can often be prevented.  Routine colorectal cancer screening usually begins at age 52 and continues through age 44.  If you have risk factors for colon cancer, your health care provider may recommend that you be screened at an earlier age.  If you have a family history of colorectal cancer, talk with your health care provider about genetic screening.  Your health care provider may also recommend using home test kits to check for hidden blood in your stool.  A small camera at the end of a tube can be used to examine your colon directly (sigmoidoscopy or colonoscopy). This is done to check for the earliest forms of colorectal cancer.  Direct examination of the colon should be repeated every 5-10 years until age 24. However, if early forms of precancerous polyps or small growths are found or if you have a family history or genetic risk for colorectal cancer, you may need to be screened more often.  Skin Cancer  Check your skin from head to toe regularly.  Monitor any moles. Be sure to tell your health care provider: ? About any new moles or changes in moles, especially if there is a change in a mole's shape or color. ? If you have a mole that is larger than the size of a pencil eraser.  If any of your family members has a history of skin cancer, especially at a young age, talk with your health  care provider about genetic screening.  Always use sunscreen. Apply sunscreen liberally and repeatedly throughout the day.  Whenever you are outside, protect yourself by wearing long sleeves, pants, a wide-brimmed hat, and sunglasses.  What should I know about osteoporosis? Osteoporosis is a condition in which bone destruction happens more quickly than new bone creation. After menopause, you may be at an increased risk for osteoporosis. To help prevent osteoporosis or the bone fractures that can happen because of osteoporosis, the following is recommended:  If you are 66-42 years old, get at least 1,000 mg of calcium and at least 600 mg of vitamin D per day.  If you are older than age 63  but younger than age 65, get at least 1,200 mg of calcium and at least 600 mg of vitamin D per day.  If you are older than age 44, get at least 1,200 mg of calcium and at least 800 mg of vitamin D per day.  Smoking and excessive alcohol intake increase the risk of osteoporosis. Eat foods that are rich in calcium and vitamin D, and do weight-bearing exercises several times each week as directed by your health care provider. What should I know about how menopause affects my mental health? Depression may occur at any age, but it is more common as you become older. Common symptoms of depression include:  Low or sad mood.  Changes in sleep patterns.  Changes in appetite or eating patterns.  Feeling an overall lack of motivation or enjoyment of activities that you previously enjoyed.  Frequent crying spells.  Talk with your health care provider if you think that you are experiencing depression. What should I know about immunizations? It is important that you get and maintain your immunizations. These include:  Tetanus, diphtheria, and pertussis (Tdap) booster vaccine.  Influenza every year before the flu season begins.  Pneumonia vaccine.  Shingles vaccine.  Your health care provider may also  recommend other immunizations. This information is not intended to replace advice given to you by your health care provider. Make sure you discuss any questions you have with your health care provider. Document Released: 12/02/2005 Document Revised: 04/29/2016 Document Reviewed: 07/14/2015 Elsevier Interactive Patient Education  2018 [employer].

## 2017-12-03 NOTE — Progress Notes (Signed)
Patient ID: [name], female   DOB: Feb 23, 1946, 72 y.o.   MRN: [id number]   Subjective:    Patient ID: [name], female    DOB: [birth year], 72 y.o.   MRN: [id number]  No chief complaint on file.   HPI Patient is in today for follow up. She feels well today. No recent febrile illness or hospitalizations. No polyuria or polydipsia. Is trying to maintain a heart healthy diet and stay active. Denies CP/palp/SOB/HA/congestion/fevers/GI or GU c/o. Taking meds as prescribed  Past Medical History:  Diagnosis Date  . Anxiety   . Arthritis   . CAD (coronary artery disease)   . Crohn's colitis ([place])    she reports ulcerative colitis beginning in her 79's, biopsies 2008 suggest Crohn's not UC  . Depression   . GERD (gastroesophageal reflux disease)   . Hyperlipidemia   . Hypertension   . Keloid of skin   . Obesity   . OSA (obstructive sleep apnea)   . Pancreatitis   . Paroxysmal atrial fibrillation ([facility])   . 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 Renal oncocytoma    s/p right nephrectomy  . Rheumatoid arthritis ([place]) 05/29/2017    Past Surgical History:  Procedure Laterality Date  . BREAST BIOPSY  1996   benign lesion   . Cataract surgery Left 11/13/14  . CHOLECYSTECTOMY  10/2006  . COLONOSCOPY W/ BIOPSIES    . CORONARY ANGIOPLASTY    . ESOPHAGOGASTRODUODENOSCOPY    . NEPHRECTOMY  10/2006   right secondary to oncocytoma   . RIGHT FEMORAL  PSEUDOANEURYSM & RIGHT GROIN HEMATOMA EVACUATION  02/2007  . RIGHT RENAL ARTERY REPAIR  1976  . TUBAL LIGATION      Family History  Problem Relation Age of Onset  . Colitis Father   . Heart disease Father   . Hypertension Mother   . Coronary artery disease Mother   . Stroke Mother   . Clotting disorder Mother   . Heart disease Mother   . Allergies Mother   . Stroke Brother   . Colon cancer Paternal Aunt   . Clotting disorder Brother   . Clotting disorder Sister   . Asthma Daughter   . Asthma Maternal Uncle   .  Colon polyps Neg Hx   . Kidney disease Neg Hx     Social History   Socioeconomic History  . Marital status: Married    Spouse name: Not on file  . Number of children: 3  . Years of education: Not on file  . Highest education level: Not on file  Social Needs  . Financial resource strain: Not on file  . Food insecurity - worry: Not on file  . Food insecurity - inability: Not on file  . Transportation needs - medical: Not on file  . Transportation needs - non-medical: Not on file  Occupational History  . Occupation: Retired    Fish farm manager: RETIRED    Comment: retired  Tobacco Use  . Smoking status: Never Smoker  . Smokeless tobacco: Never Used  Substance and Sexual Activity  . Alcohol use: No  . Drug use: No  . Sexual activity: No  Other Topics Concern  . 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 acetaminophen (  TYLENOL) 325 MG tablet Take 650 mg by mouth every 6 (six) hours as needed.    Marland Kitchen amLODipine (NORVASC) 10 MG tablet Take 1 tablet (10 mg total) by mouth daily. 90 tablet 1  . dofetilide (TIKOSYN) 125 MCG capsule TAKE 3 CAPSULES BY MOUTH TWICE A DAY 540 capsule 3  . escitalopram (LEXAPRO) 10 MG tablet Take 1 tablet (10 mg total) by mouth daily. 90 tablet 0  . fluticasone (FLONASE) 50 MCG/ACT nasal spray Place 2 sprays into both nostrils daily. (Patient not taking: Reported on 11/30/2017) 16 g 6  . hydrALAZINE (APRESOLINE) 50 MG tablet Take 1 tablet (50 mg total) by mouth 3 (three) times daily. 90 tablet 6  . hydrocortisone (ANUSOL-HC) 25 MG suppository UNWRAP AND INSERT 1 SUPPOSITORY RECTALLY AT BEDTIME AS NEEDED FOR HEMORRHOIDS OR ITCHING 12 suppository 1  . InFLIXimab (REMICADE IV) Inject into the vein as directed.    . loperamide (IMODIUM A-D) 2 MG tablet Take 1 mg by  mouth daily as needed. Dihrrhea    . losartan (COZAAR) 100 MG tablet TAKE 1 TABLET (100 MG TOTAL) BY MOUTH DAILY. 90 tablet 3  . metoprolol tartrate (LOPRESSOR) 25 MG tablet Take 1 tablet (25 mg total) by mouth 2 (two) times daily. 180 tablet 2  . Multiple Vitamin (MULTIVITAMIN) capsule Take 1 capsule by mouth daily. 1 daily      . nitroGLYCERIN (NITROSTAT) 0.4 MG SL tablet Place 1 tablet (0.4 mg total) under the tongue every 5 (five) minutes as needed. 1 tab under tongue every 5 min for chest pain not to exceed 3 tabs in 15 min 25 tablet 6  . Probiotic Product ([place]) CAPS Take 1 capsule by mouth daily. 1 per day    . Vitamin D, Cholecalciferol, 1000 units TABS Take by mouth daily.    Marland Kitchen warfarin (COUMADIN) 2.5 MG tablet TAKE 1-1.5 TABLETS BY MOUTH DAILY AS DIRECTED BY COUMADIN CLINIC 120 tablet 0   No facility-administered medications prior to visit.     Allergies  Allergen Reactions  . Cefdinir     Pt cannot take; may interfere with AFib.  . Codeine     Heart beats fast   . Guaifenesin Er Nausea And Vomiting and Other (See Comments)    Headaches; can tolerate liquid.  . Latex     Breathing problems  . Mucilgen [Psyllium]   . Percocet [Oxycodone-Acetaminophen]     Itching & swelling in jaw    Review of Systems  Constitutional: Negative for fever and malaise/fatigue.  HENT: Negative for congestion.   Eyes: Negative for blurred vision.  Respiratory: Negative for shortness of breath.   Cardiovascular: Negative for chest pain, palpitations and leg swelling.  Gastrointestinal: Negative for abdominal pain, blood in stool and nausea.  Genitourinary: Negative for dysuria and frequency.  Musculoskeletal: Negative for falls.  Skin: Negative for rash.  Neurological: Negative for dizziness, loss of consciousness and headaches.  Endo/Heme/Allergies: Negative for environmental allergies.  Psychiatric/Behavioral: Negative for depression. The patient is not nervous/anxious.         Objective:    Physical Exam  Constitutional: She is oriented to person, place, and time. She appears well-developed and well-nourished. No distress.  HENT:  Head: Normocephalic and atraumatic.  Eyes: Conjunctivae are normal.  Neck: Neck supple. No thyromegaly present.  Cardiovascular: Normal rate, regular rhythm and normal heart sounds.  No murmur heard. Pulmonary/Chest: Effort normal and breath sounds normal. No respiratory distress.  Abdominal: Soft. Bowel sounds are normal. She exhibits  no distension and no mass. There is no tenderness.  Musculoskeletal: She exhibits no edema.  Lymphadenopathy:    She has no cervical adenopathy.  Neurological: She is alert and oriented to person, place, and time.  Skin: Skin is warm and dry.  Psychiatric: She has a normal mood and affect. Her behavior is normal.    There were no vitals taken for this visit. Wt Readings from Last 3 Encounters:  11/30/17 241 lb (109.3 kg)  11/07/17 242 lb (109.8 kg)  05/29/17 243 lb 3.2 oz (110.3 kg)     Lab Results  Component Value Date   WBC 6.7 11/30/2017   HGB 14.0 11/30/2017   HCT 41.9 11/30/2017   PLT 208.0 11/30/2017   GLUCOSE 95 11/30/2017   CHOL 154 11/30/2017   TRIG 136.0 11/30/2017   HDL 42.30 11/30/2017   LDLCALC 85 11/30/2017   ALT 19 11/30/2017   AST 20 11/30/2017   NA 139 11/30/2017   K 4.2 11/30/2017   CL 104 11/30/2017   CREATININE 1.08 11/30/2017   BUN 16 11/30/2017   CO2 27 11/30/2017   TSH 2.52 11/30/2017   INR 1.9 11/21/2017   HGBA1C 5.6 11/30/2017    Lab Results  Component Value Date   TSH 2.52 11/30/2017   Lab Results  Component Value Date   WBC 6.7 11/30/2017   HGB 14.0 11/30/2017   HCT 41.9 11/30/2017   MCV 95.6 11/30/2017   PLT 208.0 11/30/2017   Lab Results  Component Value Date   NA 139 11/30/2017   K 4.2 11/30/2017   CO2 27 11/30/2017   GLUCOSE 95 11/30/2017   BUN 16 11/30/2017   CREATININE 1.08 11/30/2017   BILITOT 0.6 11/30/2017    ALKPHOS 54 11/30/2017   AST 20 11/30/2017   ALT 19 11/30/2017   PROT 7.5 11/30/2017   ALBUMIN 3.8 11/30/2017   CALCIUM 9.2 11/30/2017   ANIONGAP 11 11/13/2015   GFR 52.96 (L) 11/30/2017   Lab Results  Component Value Date   CHOL 154 11/30/2017   Lab Results  Component Value Date   HDL 42.30 11/30/2017   Lab Results  Component Value Date   LDLCALC 85 11/30/2017   Lab Results  Component Value Date   TRIG 136.0 11/30/2017   Lab Results  Component Value Date   CHOLHDL 4 11/30/2017   Lab Results  Component Value Date   HGBA1C 5.6 11/30/2017       Assessment & Plan:   Problem List Items Addressed This Visit    Hyperlipidemia    Encouraged heart healthy diet, increase exercise, avoid trans fats, consider a krill oil cap daily      Relevant Orders   Lipid panel (Completed)   OSA (obstructive sleep apnea)    Doing well on CPAP      Essential hypertension    Well controlled, no changes to meds. Encouraged heart healthy diet such as the DASH diet and exercise as tolerated.       Relevant Orders   CBC with Differential/Platelet (Completed)   Comprehensive metabolic panel (Completed)   TSH (Completed)   Osteoporosis    Encouraged to get adequate exercise, calcium and vitamin d intake, check Vitamin D and supplement      Relevant Orders   VITAMIN D 25 Hydroxy (Vit-D Deficiency, Fractures) (Completed)   Hyperglycemia    hgba1c acceptable, minimize simple carbs. Increase exercise as tolerated.       Relevant Orders   Hemoglobin A1c (Completed)  I am having Zannie Locastro. Stukey maintain her loperamide, multivitamin, [facility], InFLIXimab (REMICADE IV), nitroGLYCERIN, acetaminophen, Vitamin D (Cholecalciferol), fluticasone, warfarin, metoprolol tartrate, losartan, escitalopram, amLODipine, hydrocortisone, dofetilide, and hydrALAZINE.  No orders of the defined types were placed in this encounter.    Penni Homans, MD

## 2017-12-04 ENCOUNTER — Encounter: Payer: Self-pay | Admitting: Family Medicine

## 2017-12-12 DIAGNOSIS — M0589 Other rheumatoid arthritis with rheumatoid factor of multiple sites: Secondary | ICD-10-CM | POA: Diagnosis not present

## 2017-12-12 DIAGNOSIS — Z79899 Other long term (current) drug therapy: Secondary | ICD-10-CM | POA: Diagnosis not present

## 2017-12-13 ENCOUNTER — Ambulatory Visit (INDEPENDENT_AMBULATORY_CARE_PROVIDER_SITE_OTHER): Payer: Medicare Other | Admitting: Pharmacist Clinician (PhC)/ Clinical Pharmacy Specialist

## 2017-12-13 DIAGNOSIS — I480 Paroxysmal atrial fibrillation: Secondary | ICD-10-CM | POA: Diagnosis not present

## 2017-12-13 DIAGNOSIS — Z5181 Encounter for therapeutic drug level monitoring: Secondary | ICD-10-CM | POA: Diagnosis not present

## 2017-12-13 LAB — POCT INR: INR: 2.1

## 2017-12-13 NOTE — Patient Instructions (Signed)
Description   Continue with 1/2 tablet each Monday and Friday, 1 tablet all other days, repeat INR in 4 weeks

## 2017-12-18 ENCOUNTER — Telehealth: Payer: Self-pay | Admitting: Cardiology

## 2017-12-18 MED ORDER — DOXAZOSIN MESYLATE 2 MG PO TABS: 2.0000 mg | ORAL_TABLET | Freq: Every day | ORAL | 6 refills | Status: DC

## 2017-12-18 NOTE — Telephone Encounter (Signed)
Spoke with pt, Aware of dr Jacalyn Lefevre recommendations. She reports she was told not to take HCTZ due to [place]. Will forward for dr Stanford Breed review

## 2017-12-18 NOTE — Telephone Encounter (Signed)
cardura 2 mg daily [name]

## 2017-12-18 NOTE — Telephone Encounter (Signed)
Spoke with pt, Aware of dr crenshaw's recommendations. New script sent to the pharmacy  

## 2017-12-18 NOTE — Telephone Encounter (Signed)
[name] is calling because she is experiencing  all of the side effects from the Hydralazine 50 mg . She Takes it twice a day and is wanting to know if there is another blood pressure medication in which she can take . Please call

## 2017-12-18 NOTE — Telephone Encounter (Signed)
Spoke with pt, she reports she is having headache, nausea, loss of appetite and stomach ache, these are all side effects of the hydralazine. She would like to change to something else. Will forward for dr Stanford Breed review

## 2017-12-18 NOTE — Telephone Encounter (Signed)
DC hydralazine; HCTZ 12.5 daily; bmet one week; follow BP Kirk Ruths

## 2017-12-23 ENCOUNTER — Other Ambulatory Visit: Payer: Self-pay | Admitting: Family Medicine

## 2017-12-26 DIAGNOSIS — H524 Presbyopia: Secondary | ICD-10-CM | POA: Diagnosis not present

## 2017-12-26 DIAGNOSIS — H26492 Other secondary cataract, left eye: Secondary | ICD-10-CM | POA: Diagnosis not present

## 2017-12-26 DIAGNOSIS — H35342 Macular cyst, hole, or pseudohole, left eye: Secondary | ICD-10-CM | POA: Diagnosis not present

## 2018-01-10 ENCOUNTER — Ambulatory Visit (INDEPENDENT_AMBULATORY_CARE_PROVIDER_SITE_OTHER): Payer: Medicare Other | Admitting: Pharmacist

## 2018-01-10 DIAGNOSIS — I480 Paroxysmal atrial fibrillation: Secondary | ICD-10-CM | POA: Diagnosis not present

## 2018-01-10 DIAGNOSIS — Z5181 Encounter for therapeutic drug level monitoring: Secondary | ICD-10-CM

## 2018-01-10 LAB — POCT INR: INR: 3

## 2018-01-11 ENCOUNTER — Telehealth: Payer: Self-pay | Admitting: Internal Medicine

## 2018-01-11 NOTE — Telephone Encounter (Signed)
Patient reports that her whole system is " in an uproar".  Patient has been having hemorrhoid pain and itching.  She also has had stomach burning.  She will increase her Nexium to BID and start on a bland diet. Patient instructed to maintain an anti-reflux diet. Advised to avoid caffeine, mint, citrus foods/juices, tomatoes,  chocolate, NSAIDS/ASA products.  Instructed not to eat within 2 hours of exercise or bed, multiple small meals are better than 3 large meals.  Need to take PPI 30 minutes prior to 1st meal of the day and the second at HS.  She will try Recticare and keep the appt on Monday

## 2018-01-15 ENCOUNTER — Encounter: Payer: Self-pay | Admitting: Physician Assistant

## 2018-01-15 ENCOUNTER — Ambulatory Visit (INDEPENDENT_AMBULATORY_CARE_PROVIDER_SITE_OTHER): Payer: Medicare Other | Admitting: Physician Assistant

## 2018-01-15 ENCOUNTER — Other Ambulatory Visit (INDEPENDENT_AMBULATORY_CARE_PROVIDER_SITE_OTHER): Payer: Medicare Other

## 2018-01-15 VITALS — BP 142/68 | HR 83 | Ht 63.0 in | Wt 236.2 lb

## 2018-01-15 DIAGNOSIS — R1084 Generalized abdominal pain: Secondary | ICD-10-CM

## 2018-01-15 DIAGNOSIS — I2510 Atherosclerotic heart disease of native coronary artery without angina pectoris: Secondary | ICD-10-CM

## 2018-01-15 DIAGNOSIS — Z8719 Personal history of other diseases of the digestive system: Secondary | ICD-10-CM

## 2018-01-15 LAB — BASIC METABOLIC PANEL
BUN: 15 mg/dL (ref 6–23)
CHLORIDE: 102 meq/L (ref 96–112)
CO2: 29 mEq/L (ref 19–32)
Calcium: 9.4 mg/dL (ref 8.4–10.5)
Creatinine, Ser: 1.09 mg/dL (ref 0.40–1.20)
GFR: 52.38 mL/min — ABNORMAL LOW (ref >60.00)
Glucose, Bld: 98 mg/dL (ref 70–99)
POTASSIUM: 4.3 meq/L (ref 3.5–5.1)
Sodium: 138 mEq/L (ref 135–145)

## 2018-01-15 LAB — IGA: IGA: 377 mg/dL (ref 68–378)

## 2018-01-15 MED ORDER — ESOMEPRAZOLE MAGNESIUM 40 MG PO CPDR: 40.0000 mg | DELAYED_RELEASE_CAPSULE | Freq: Every day | ORAL | 11 refills | Status: AC

## 2018-01-15 NOTE — Progress Notes (Addendum)
Subjective:    Patient ID: [name], female    DOB: 1946-04-18, 72 y.o.   MRN: [id number]  HPI Shan is a pleasant 72 year old white female, known to Dr. Carlean Purl who was last seen in November 2017.  She has history of IBD/Crohn's colitis which has been in remission.  She is on Remicade for rheumatoid arthritis over the past several years. She also has history of hypertension, coronary artery disease status post MI, atrial fibrillation for which she is on Coumadin, and is status post right nephrectomy. Last colonoscopy 2013 with 3 diminutive sessile polyps removed and severe left colon diverticulosis as well as internal hemorrhoids.  There was no active inflammation.  Biopsies showed the polyps to be tubular adenomas. When she was seen in 2017 she was scheduled for follow-up colonoscopy but decided not to pursue that. She comes in today after an episode that started about 2 weeks ago after she had eaten eggs from biscuit [place] and then an orange.  She said she started having burning abdominal pain and had multiple episodes of diarrhea thereafter which lasted throughout the night and into the following morning.  She had called here and was started on a bland diet, asked to take her Nexium twice daily and says that her symptoms have resolved over the past for 5 days and now she is feeling pretty good.  She had a normal bowel movement today.  She did not pass any blood as far she was aware, no fever or chills. She also mentions that she had had a lot of burning abdominal pain last summer which she relates to starting hydralazine which was since stopped. She has been following a mostly gluten-free diet over the past year as well and says she feels better and she would like to be tested for celiac disease. We discussed indication for follow-up colonoscopy today.  She has had a lot of medical complications, and is afraid to have another colonoscopy.  She is also very concerned because she only has  one kidney and does not want to do anything to jeopardize her kidney.  She had a prolonged hospitalization after a complication post cardiac cath a couple of years ago.  She is interested to know what other screening methods are available and specifically asks about virtual colonoscopy.  Review of Systems Pertinent positive and negative review of systems were noted in the above HPI section.  All other review of systems was otherwise negative.  Outpatient Encounter Medications as of 01/15/2018  Medication Sig  . acetaminophen (TYLENOL) 325 MG tablet Take 650 mg by mouth every 6 (six) hours as needed.  Marland Kitchen amLODipine (NORVASC) 10 MG tablet Take 1 tablet (10 mg total) by mouth daily.  Marland Kitchen dofetilide (TIKOSYN) 125 MCG capsule TAKE 3 CAPSULES BY MOUTH TWICE A DAY  . escitalopram (LEXAPRO) 20 MG tablet TAKE 1/2 A TABLET (10 MG TOTAL) BY MOUTH DAILY.  . hydrocortisone (ANUSOL-HC) 25 MG suppository UNWRAP AND INSERT 1 SUPPOSITORY RECTALLY AT BEDTIME AS NEEDED FOR HEMORRHOIDS OR ITCHING  . InFLIXimab (REMICADE IV) Inject into the vein as directed.  . loperamide (IMODIUM A-D) 2 MG tablet Take 1 mg by mouth daily as needed. Dihrrhea  . losartan (COZAAR) 100 MG tablet TAKE 1 TABLET (100 MG TOTAL) BY MOUTH DAILY.  . metoprolol tartrate (LOPRESSOR) 25 MG tablet Take 1 tablet (25 mg total) by mouth 2 (two) times daily.  . Multiple Vitamin (MULTIVITAMIN) capsule Take 1 capsule by mouth daily. 1 daily    .  nitroGLYCERIN (NITROSTAT) 0.4 MG SL tablet Place 1 tablet (0.4 mg total) under the tongue every 5 (five) minutes as needed. 1 tab under tongue every 5 min for chest pain not to exceed 3 tabs in 15 min  . Probiotic Product ([place]) CAPS Take 1 capsule by mouth daily. 1 per day  . Vitamin D, Cholecalciferol, 1000 units TABS Take by mouth daily.  Marland Kitchen warfarin (COUMADIN) 2.5 MG tablet TAKE 1-1.5 TABLETS BY MOUTH DAILY AS DIRECTED BY COUMADIN CLINIC  . esomeprazole (NEXIUM) 40 MG capsule Take 1 capsule (40  mg total) by mouth daily. Take in the morning before breakfast.  . [DISCONTINUED] doxazosin (CARDURA) 2 MG tablet Take 1 tablet (2 mg total) by mouth daily.  . [DISCONTINUED] fluticasone (FLONASE) 50 MCG/ACT nasal spray Place 2 sprays into both nostrils daily. (Patient not taking: Reported on 11/30/2017)   No facility-administered encounter medications on file as of 01/15/2018.    Allergies  Allergen Reactions  . Cefdinir     Pt cannot take; may interfere with AFib.  . Codeine     Heart beats fast   . Guaifenesin Er Nausea And Vomiting and Other (See Comments)    Headaches; can tolerate liquid.  . Latex     Breathing problems  . Mucilgen [Psyllium]   . Percocet [Oxycodone-Acetaminophen]     Itching & swelling in jaw   Patient Active Problem List   Diagnosis Date Noted  . Renal oncocytoma   . Pseudoaneurysm of right femoral artery ([place])   . Paroxysmal atrial fibrillation ([facility])   . Pancreatitis   . Hypertension   . GERD (gastroesophageal reflux disease)   . CAD (coronary artery disease)   . Arthritis   . Anxiety   . Rheumatoid arthritis ([place]) 05/29/2017  . Skin lesion 05/29/2017  . Dry mouth 08/22/2016  . Cerebrovascular disease 07/30/2014  . Sinusitis 06/30/2014  . Encounter for therapeutic drug monitoring 06/09/2014  . Pain in joint of right shoulder 04/21/2014  . Other acne 04/21/2014  . Intertrigo 11/12/2013  . Keloid of skin   . Bilateral leg edema 09/12/2012  . IBS (irritable bowel syndrome) 08/21/2012  . Medicare annual wellness visit, subsequent 08/17/2012  . Radicular low back pain 06/28/2012  . Basal cell carcinoma 02/07/2012  . Maxillary sinus cyst 02/07/2012  . Statin intolerance 02/28/2011  . Crohn's colitis ([place])   . 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  . CAROTID BRUIT 12/10/2009  . Hyperglycemia 07/02/2009  . HEADACHE 02/24/2009  . ATRIAL FIBRILLATION 01/15/2009  . Obesity 01/03/2008  . OSA (obstructive sleep apnea) 01/03/2008    . Osteoporosis 11/05/2007  . Hyperlipidemia 09/06/2007  . DEPRESSION/ANXIETY 09/06/2007  . Hearing loss 09/06/2007  . Essential hypertension 09/06/2007  . MYOCARDIAL INFARCTION, HX OF 09/06/2007  . Coronary atherosclerosis 09/06/2007  . ALLERGIC RHINITIS 09/06/2007  . COLONIC POLYPS, ADENOMATOUS, HX OF 07/04/2007   Social History   Socioeconomic History  . Marital status: Married    Spouse name: Not on file  . Number of children: 3  . Years of education: Not on file  . Highest education level: Not on file  Occupational History  . 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 Financial resource strain: Not on file  . Food insecurity:    Worry: Not on file    Inability: Not on file  . Transportation needs:    Medical: Not on file    Non-medical: Not on file  Tobacco Use  . Smoking status: Never Smoker  . Smokeless tobacco: Never Used  Substance and Sexual Activity  . Alcohol use: No  . Drug use: No  . Sexual activity: Never  Lifestyle  . Physical activity:    Days per week: Not on file    Minutes per session: Not on file  . Stress: Not on file  Relationships  . Social connections:    Talks on phone: Not on file    Gets together: Not on file    Attends religious service: Not on file    Active member of club or organization: Not on file    Attends meetings of clubs or organizations: Not on file    Relationship status: Not on file  . Intimate partner violence:    Fear of current or ex partner: Not on file    Emotionally abused: Not on file    Physically abused: Not on file    Forced sexual activity: Not on file  Other Topics Concern  . 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 Schlack's family history includes Allergies in her mother; Asthma in her daughter and maternal uncle; Clotting  disorder in her brother, mother, and sister; Colitis in her father; Colon cancer in her paternal aunt; Coronary artery disease in her mother; Heart disease in her father and mother; Hypertension in her mother; Stroke in her brother and mother.      Objective:    Vitals:   01/15/18 1324  BP: (!) 142/68  Pulse: 83    Physical Exam; well-developed older white female in no acute distress, pleasant blood pressure 142/68 pulse 83, height 5 foot 3, weight 236, BMI 41.8.  HEENT; nontraumatic normocephalic EOMI PERRLA sclera anicteric, Cardiovascular regular rate and rhythm with S1-S2 no murmur rub or gallop, Pulmonary ;clear bilaterally, Abdomen; obese, soft nontender no palpable mass or hepatosplenomegaly bowel sounds are present, cholecystectomy scar.  Rectal; exam she has small external hemorrhoidal tags and on anoscopy has 2 columns of internal hemorrhoids. Extremities; no clubbing cyanosis or edema skin warm dry, Neuro psych; mood and affect appropriate       Assessment & Plan:   #80 72 year old white female with history of IBD/Crohn's colitis which has been in remission over the past several years comes in today after a recent episode of severe burning abdominal pain and multiple episodes of diarrhea occurring about a week and a half ago.  Symptoms have since resolved and she has had a normal bowel movement today. I suspect she had an episode of acute gastroenteritis, foodborne or viral  #2 rheumatoid arthritis-on Remicade #3 history of adenomatous colon polyps-overdue for follow-up colonoscopy. #4 diverticulosis, severe #5 atrial fibrillation-on Coumadin #6 coronary artery disease status post MI #7.  Obstructive sleep apnea #8.  Obesity #9.  Hypertension #10.  Status post right nephrectomy #11.  Prior history of pancreatitis #12 internal hemorrhoids-asymptomatic  Plan; Continue Nexium 40 mg but decrease to 1 p.o. every morning before meals breakfast TTG and IgA Long discussion with  patient today regarding indication for follow-up colonoscopy and alternative screening methods.  She was advised that her insurance would likely not cover virtual colonoscopy, and if polyps are found colonoscopy still indicated. She does not want to have another colonoscopy. With history of IBD, and recent episode of severe abdominal pain  and diarrhea will proceed with CT of the abdomen and pelvis, to assess for any active IBD and also rule out any obvious colon lesion.  Terrace Fontanilla S Synia Douglass PA-C 01/15/2018   Cc: [name], MD Agree with Ms. Genia Harold assessment and plan.  CT was negative for GI problems. TTG Ab neg  Gatha Mayer, MD, [facility]

## 2018-01-15 NOTE — Patient Instructions (Addendum)
If you are age 73 or older, your body mass index should be between 23-30. Your Body mass index is 41.84 kg/m. If this is out of the aforementioned range listed, please consider follow up with your Primary Care Provider.  If you are age 32 or younger, your body mass index should be between 19-25. Your Body mass index is 41.84 kg/m. If this is out of the aformentioned range listed, please consider follow up with your Primary Care Provider.   We have sent the following medications to your pharmacy for you to pick up at your convenience: Nexium 70m by mouth once daily before breakfast.  Your physician has requested that you go to the basement for the following lab work before leaving today: IGA TTG  You have been scheduled for a CT scan of the abdomen and pelvis at [facility]this is in the same building as [facility].   You are scheduled on 01/16/2018 at 2:30pm. You should arrive 15 minutes prior to your appointment time for registration. Please follow the written instructions below on the day of your exam:  WARNING: IF YOU ARE ALLERGIC TO IODINE/X-RAY DYE, PLEASE NOTIFY RADIOLOGY IMMEDIATELY AT [phone] YOU WILL BE GIVEN A 13 HOUR PREMEDICATION PREP.  1) Do not eat or drink anything after 10:30am (4 hours prior to your test) 2) You have been given 2 bottles of oral contrast to drink. The solution may taste better if refrigerated, but do NOT add ice or any other liquid to this solution. Shake well before drinking.    Drink 1 bottle of contrast @ 12:30pm (2 hours prior to your exam)  Drink 1 bottle of contrast @ 1:30pm (1 hour prior to your exam)  You may take any medications as prescribed with a small amount of water except for the following: Metformin, Glucophage, Glucovance, Avandamet, Riomet, Fortamet, Actoplus Met, Janumet, Glumetza or Metaglip. The above medications must be held the day of the exam AND 48 hours after the exam.  The purpose  of you drinking the oral contrast is to aid in the visualization of your intestinal tract. The contrast solution may cause some diarrhea. Before your exam is started, you will be given a small amount of fluid to drink. Depending on your individual set of symptoms, you may also receive an intravenous injection of x-ray contrast/dye. [facility], depending on the type of exam you are having performed.  If you have any questions regarding your exam or if you need to reschedule, you may call the CT department at [phone] the hours of 8:00 am and 5:00 pm, Monday-Friday.  ________________________________________________________________________  TL[facility] Raveyou, [facility]

## 2018-01-16 ENCOUNTER — Ambulatory Visit (INDEPENDENT_AMBULATORY_CARE_PROVIDER_SITE_OTHER)
Admission: RE | Admit: 2018-01-16 | Discharge: 2018-01-16 | Disposition: A | Discharge status: Home / Self Care | Payer: Medicare Other | Source: Ambulatory Visit | Attending: Physician Assistant | Admitting: Physician Assistant

## 2018-01-16 DIAGNOSIS — R1084 Generalized abdominal pain: Secondary | ICD-10-CM

## 2018-01-16 DIAGNOSIS — R109 Unspecified abdominal pain: Secondary | ICD-10-CM | POA: Diagnosis not present

## 2018-01-16 DIAGNOSIS — Z8719 Personal history of other diseases of the digestive system: Secondary | ICD-10-CM

## 2018-01-16 MED ORDER — IOPAMIDOL (ISOVUE-300) INJECTION 61%
100.0000 mL | Freq: Once | INTRAVENOUS | Status: AC | PRN
  Administered 2018-01-16: 100 mL via INTRAVENOUS

## 2018-01-18 ENCOUNTER — Telehealth: Payer: Self-pay

## 2018-01-18 ENCOUNTER — Telehealth: Payer: Self-pay | Admitting: Cardiology

## 2018-01-18 DIAGNOSIS — R1084 Generalized abdominal pain: Secondary | ICD-10-CM

## 2018-01-18 DIAGNOSIS — K580 Irritable bowel syndrome with diarrhea: Secondary | ICD-10-CM

## 2018-01-18 NOTE — Telephone Encounter (Signed)
Not unusual to see aortic atherosclerosis in 72 yo pt; no need for further testing. [name]

## 2018-01-18 NOTE — Telephone Encounter (Signed)
It has been noted that a lab order had not been "future" ordered and was therefore not drawn. I have contacted the patient. She will return next week to have the tissue trans glutaminase Abs, IgG, IgA re-drawn.

## 2018-01-18 NOTE — Telephone Encounter (Signed)
Patient wanted Dr Stanford Breed to review her recent CT  abd report 01/16/18 . It indicated  Aortic atherosclerosis. She was concerned if anything  [name] is needed. Aware will defer to Dr Stanford Breed

## 2018-01-18 NOTE — Telephone Encounter (Signed)
New message  Pt verbalized that she is calling for RN  Tami Ribas with Dr.Gestner gave pt of new diagnoses  After having a CT done on 01-16-2018 at the [address] location with contrast dye

## 2018-01-19 NOTE — Telephone Encounter (Signed)
Spoke with pt, Aware of dr crenshaw's recommendations.  °

## 2018-01-31 ENCOUNTER — Other Ambulatory Visit: Payer: Medicare Other

## 2018-01-31 ENCOUNTER — Encounter: Payer: Medicare Other | Admitting: Physician Assistant

## 2018-01-31 DIAGNOSIS — K580 Irritable bowel syndrome with diarrhea: Secondary | ICD-10-CM

## 2018-01-31 DIAGNOSIS — R1084 Generalized abdominal pain: Secondary | ICD-10-CM

## 2018-01-31 MED ORDER — DOXAZOSIN MESYLATE 2 MG PO TABS: 2.0000 mg | ORAL_TABLET | Freq: Every day | ORAL | 0 refills | Status: DC

## 2018-01-31 NOTE — Progress Notes (Signed)
After discussing with the patient today, she wish to give Cardura another try. Will cancel today's visit and reschedule for 2 weeks. May need to consider clonidine if cardura cannot control her BP.  This encounter was created in error - please disregard.

## 2018-01-31 NOTE — Patient Instructions (Signed)
Medication Instructions:  RESTART Cardura 2mg  take 1 tablet once a day  Labwork: None   Testing/Procedures: None   Follow-Up: Your physician recommends that you schedule a follow-up appointment in: 2 weeks with [name]  Any Other Special Instructions Will Be Listed Below (If Applicable).  If you need a refill on your cardiac medications before your next appointment, please call your pharmacy.

## 2018-02-01 LAB — TISSUE TRANSGLUTAMINASE ABS,IGG,IGA
Tissue Transglut Ab: <2 U/mL (ref 0–5)
Transglutaminase IgA: <2 U/mL (ref 0–3)

## 2018-02-07 ENCOUNTER — Ambulatory Visit (INDEPENDENT_AMBULATORY_CARE_PROVIDER_SITE_OTHER): Payer: Medicare Other | Admitting: Pharmacist

## 2018-02-07 DIAGNOSIS — Z5181 Encounter for therapeutic drug level monitoring: Secondary | ICD-10-CM

## 2018-02-07 DIAGNOSIS — I480 Paroxysmal atrial fibrillation: Secondary | ICD-10-CM | POA: Diagnosis not present

## 2018-02-07 LAB — POCT INR: INR: 2.5

## 2018-02-08 DIAGNOSIS — M0589 Other rheumatoid arthritis with rheumatoid factor of multiple sites: Secondary | ICD-10-CM | POA: Diagnosis not present

## 2018-02-14 ENCOUNTER — Encounter: Payer: Self-pay | Admitting: Physician Assistant

## 2018-02-14 ENCOUNTER — Ambulatory Visit (INDEPENDENT_AMBULATORY_CARE_PROVIDER_SITE_OTHER): Payer: Medicare Other | Admitting: Physician Assistant

## 2018-02-14 VITALS — BP 141/70 | HR 50 | Ht 63.0 in | Wt 235.0 lb

## 2018-02-14 DIAGNOSIS — Q600 Renal agenesis, unilateral: Secondary | ICD-10-CM | POA: Diagnosis not present

## 2018-02-14 DIAGNOSIS — I2510 Atherosclerotic heart disease of native coronary artery without angina pectoris: Secondary | ICD-10-CM

## 2018-02-14 DIAGNOSIS — I10 Essential (primary) hypertension: Secondary | ICD-10-CM

## 2018-02-14 DIAGNOSIS — IMO0002 Reserved for concepts with insufficient information to code with codable children: Secondary | ICD-10-CM

## 2018-02-14 DIAGNOSIS — E785 Hyperlipidemia, unspecified: Secondary | ICD-10-CM

## 2018-02-14 DIAGNOSIS — I480 Paroxysmal atrial fibrillation: Secondary | ICD-10-CM | POA: Diagnosis not present

## 2018-02-14 MED ORDER — DOXAZOSIN MESYLATE 2 MG PO TABS: 2.0000 mg | ORAL_TABLET | Freq: Every day | ORAL | 3 refills | Status: DC

## 2018-02-14 NOTE — Progress Notes (Signed)
Cardiology Office Note    Date:  02/14/2018   ID:  [name], DOB 07/09/1946, MRN [id number]  PCP:  [name], MD  Cardiologist:  [name]   Chief Complaint  Patient presents with  . Follow-up    uncontrolled HTN, seen for [name]    History of Present Illness:  [name] is a 72 y.o. female with PMH of CAD, Crohn's disease, depression, solitary kidney, GERD, hypertension, hyperlipidemia, OSA, PAF on coumadin and history of rheumatoid arthritis.  Previous cardiac catheterization in May 2008 showed EF 55%, nonobstructive plaque in LAD.  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  She was extremely sensitive to Coumadin.  MRI of the abdomen in September 2012 showed no left renal artery stenosis, previous right nephrectomy.  Workup for pheochromocytoma was negative.  Renal Doppler in May 2013 showed absent right kidney and a normal left renal artery.  Echocardiogram in 2014 showed normal LV function, grade 1 DD, mild left atrial enlargement.  Carotid Doppler in March 2017 showed mild disease bilaterally.  Event monitor in January 2018 showed a sinus rhythm with PACs, PVCs and ectopic atrial tachycardia.  She had a recurrence of atrial fibrillation in January 2018, however self converted before she underwent cardioversion. Given the infrequency of the symptom, Tikosyn was continued.  I last saw the patient in June 2018 for preoperative clearance.  Her last office visit with [name] was in January 2019, she was doing well at the time.  Due to recent elevation of blood pressure, patient was placed on Cardura 2 mg daily, however due to concern of potential side effect, she self discontinued it.  After reviewing alternative blood pressure medications including clonidine, however she says she will give cardura another try.  Patient presents today for cardiology office visit along with her blood pressure diary.  Her systolic blood pressure has been  in the 130-140s range.  Instead of 2 mg daily of Cardura, she has only been taking half a tablet.  She says that she had a lot of weakness after taking the full strength of Cardura, however she has been able to tolerate half of the tablet.  I asked her to try the full tablet of Cardura again in 1 month.  She is very sensitive to medications, I also wish to avoid adding clonidine to her medication list.  She does not have any chest pain, lower extremity edema, orthopnea or PND.   Past Medical History:  Diagnosis Date  . Anxiety   . Arthritis   . CAD (coronary artery disease)   . Crohn's colitis ([place])    she reports ulcerative colitis beginning in her 48's, biopsies 2008 suggest Crohn's not UC  . Depression   . GERD (gastroesophageal reflux disease)   . Hyperlipidemia   . Hypertension   . Keloid of skin   . Obesity   . OSA (obstructive sleep apnea)   . Pancreatitis   . Paroxysmal atrial fibrillation (HCC)   . Pseudoaneurysm of right femoral artery ([place])   . Renal oncocytoma    s/p right nephrectomy  . Rheumatoid arthritis ([place]) 05/29/2017    Past Surgical History:  Procedure Laterality Date  . BREAST BIOPSY  1996   benign lesion   . Cataract surgery Left 11/13/14  . CHOLECYSTECTOMY  10/2006  . COLONOSCOPY W/ BIOPSIES    . CORONARY ANGIOPLASTY    . ESOPHAGOGASTRODUODENOSCOPY    . NEPHRECTOMY  10/2006   right secondary  to oncocytoma   . RIGHT FEMORAL  PSEUDOANEURYSM & RIGHT GROIN HEMATOMA EVACUATION  02/2007  . RIGHT RENAL ARTERY REPAIR  1976  . TUBAL LIGATION      Current Medications: Outpatient Medications Prior to Visit  Medication Sig Dispense Refill  . acetaminophen (TYLENOL) 325 MG tablet Take 650 mg by mouth every 6 (six) hours as needed.    Marland Kitchen amLODipine (NORVASC) 10 MG tablet Take 1 tablet (10 mg total) by mouth daily. 90 tablet 1  . dofetilide (TIKOSYN) 125 MCG capsule TAKE 3 CAPSULES BY MOUTH TWICE A DAY 540 capsule 3  . escitalopram (LEXAPRO) 20 MG tablet TAKE 1/2 A  TABLET (10 MG TOTAL) BY MOUTH DAILY. 45 tablet 1  . esomeprazole (NEXIUM) 40 MG capsule Take 1 capsule (40 mg total) by mouth daily. Take in the morning before breakfast. 30 capsule 11  . hydrocortisone (ANUSOL-HC) 25 MG suppository UNWRAP AND INSERT 1 SUPPOSITORY RECTALLY AT BEDTIME AS NEEDED FOR HEMORRHOIDS OR ITCHING 12 suppository 1  . InFLIXimab (REMICADE IV) Inject into the vein as directed.    . loperamide (IMODIUM A-D) 2 MG tablet Take 1 mg by mouth daily as needed. Dihrrhea    . losartan (COZAAR) 100 MG tablet TAKE 1 TABLET (100 MG TOTAL) BY MOUTH DAILY. 90 tablet 3  . metoprolol tartrate (LOPRESSOR) 25 MG tablet Take 1 tablet (25 mg total) by mouth 2 (two) times daily. 180 tablet 2  . Multiple Vitamin (MULTIVITAMIN) capsule Take 1 capsule by mouth daily. 1 daily      . nitroGLYCERIN (NITROSTAT) 0.4 MG SL tablet Place 1 tablet (0.4 mg total) under the tongue every 5 (five) minutes as needed. 1 tab under tongue every 5 min for chest pain not to exceed 3 tabs in 15 min 25 tablet 6  . Probiotic Product ([place]) CAPS Take 1 capsule by mouth daily. 1 per day    . Vitamin D, Cholecalciferol, 1000 units TABS Take by mouth daily.    Marland Kitchen warfarin (COUMADIN) 2.5 MG tablet TAKE 1-1.5 TABLETS BY MOUTH DAILY AS DIRECTED BY COUMADIN CLINIC 120 tablet 0  . doxazosin (CARDURA) 2 MG tablet Take 1 tablet (2 mg total) by mouth daily. 30 tablet 0   No facility-administered medications prior to visit.      Allergies:   Hydralazine; Cefdinir; Codeine; Guaifenesin er; Latex; Mucilgen [psyllium]; and Percocet [oxycodone-acetaminophen]   Social History   Socioeconomic History  . Marital status: Married    Spouse name: Not on file  . Number of children: 3  . Years of education: Not on file  . Highest education level: Not on file  Occupational History  . 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 Financial resource strain: Not on file  . Food  insecurity:    Worry: Not on file    Inability: Not on file  . Transportation needs:    Medical: Not on file    Non-medical: Not on file  Tobacco Use  . Smoking status: Never Smoker  . Smokeless tobacco: Never Used  Substance and Sexual Activity  . Alcohol use: No  . Drug use: No  . Sexual activity: Never  Lifestyle  . Physical activity:    Days per week: Not on file    Minutes per session: Not on file  . Stress: Not on file  Relationships  . Social connections:    Talks on phone: Not on file    Gets  together: Not on file    Attends religious service: Not on file    Active member of club or organization: Not on file    Attends meetings of clubs or organizations: Not on file    Relationship status: Not on file  Other Topics Concern  . 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   ROS:   Please see the history of present illness.    ROS All other systems reviewed and are negative.   PHYSICAL EXAM:   VS:  BP (!) 141/70   Pulse (!) 50   Ht 5\' 3"  (1.6 m)   Wt 235 lb (106.6 kg)   BMI 41.63 kg/m    GEN: Well nourished, well developed, in no acute distress  HEENT: normal  Neck: no JVD, carotid bruits, or masses Cardiac: RRR; no murmurs, rubs, or gallops,no edema  Respiratory:  clear to auscultation bilaterally, normal work of breathing GI: soft, nontender, nondistended, + BS MS: no deformity or atrophy  Skin: warm and dry, no rash Neuro:  Alert and Oriented x 3, Strength  and sensation are intact Psych: euthymic mood, full affect  Wt Readings from Last 3 Encounters:  02/14/18 235 lb (106.6 kg)  01/15/18 236 lb 3.2 oz (107.1 kg)  11/30/17 241 lb (109.3 kg)      Studies/Labs Reviewed:   EKG:  EKG is not ordered today.    Recent Labs: 11/07/2017: Magnesium 2.1 11/30/2017: ALT 19; Hemoglobin 14.0; Platelets 208.0; TSH 2.52 01/15/2018: BUN 15; Creatinine, Ser 1.09; Potassium 4.3; Sodium 138   Lipid Panel    Component Value Date/Time   CHOL 154 11/30/2017 1026   TRIG 136.0 11/30/2017 1026   HDL 42.30 11/30/2017 1026   CHOLHDL 4 11/30/2017 1026   VLDL 27.2 11/30/2017 1026   LDLCALC 85 11/30/2017 1026    Additional studies/ records that were reviewed today include:   Echo 04/02/2013 LV EF: 60% -  65% Study Conclusions  - Left ventricle: The cavity size was normal. Wall thickness was increased in a pattern of mild LVH. Systolic function was normal. The estimated ejection fraction was in the range of 60% to 65%. Wall motion was normal; there were no regional wall motion abnormalities. Doppler parameters are consistent with abnormal left ventricular relaxation (grade 1 diastolic dysfunction). - Left atrium: The atrium was mildly dilated. - Atrial septum: No defect or patent foramen ovale was identified. - Pulmonary arteries: PA peak pressure: 45mm Hg (S).   ASSESSMENT:    1. Essential hypertension   2. Coronary artery disease involving native coronary artery of native heart without angina pectoris   3. Solitary kidney   4. Hyperlipidemia, unspecified hyperlipidemia type   5. PAF (paroxysmal atrial fibrillation) (HCC)      PLAN:  In order of problems listed above:  1. Hypertension: Blood pressure improved to 130-140s on half a tablet of Cardura 2 mg.  I recommended for her to increase Cardura to a full dose in 1  month.  2. CAD: Denies any chest pain.  Not on aspirin given the need for Coumadin.  3. Solitary kidney: Renal  function stable on recent lab work 01/15/2018.  4. PAF: On Tikosyn and metoprolol.  Continue Coumadin.  5. Hyperlipidemia: Not on statin.  Recent lab work shows total cholesterol 154, triglyceride 136, HDL 42, LDL 85.  May consider Zetia on follow-up.    Medication Adjustments/Labs and Tests Ordered: Current medicines are reviewed at length with the patient today.  Concerns regarding medicines are outlined above.  Medication changes, Labs and Tests ordered today are listed in the Patient Instructions below. Patient Instructions  Medication Instructions:  Try to Increase Cardura to full tablet by net appointment  Labwork: None   Testing/Procedures: None   Follow-Up: Your physician wants you to follow-up in: 6-9 months with Dr Stanford Breed. You will receive a reminder letter in the mail two months in advance. If you don't receive a letter, please call our office to schedule the follow-up appointment.  Any Other Special Instructions Will Be Listed Below (If Applicable). If you need a refill on your cardiac medications before your next appointment, please call your pharmacy.     Hilbert Corrigan, [place]  02/14/2018 1:22 PM    [facility] [place], [place], [place]  [address] Phone: [phone]; Fax: [phone]

## 2018-02-14 NOTE — Patient Instructions (Signed)
Medication Instructions:  Try to Increase Cardura to full tablet by net appointment  Labwork: None   Testing/Procedures: None   Follow-Up: Your physician wants you to follow-up in: 6-9 months with Dr Stanford Breed. You will receive a reminder letter in the mail two months in advance. If you don't receive a letter, please call our office to schedule the follow-up appointment.  Any Other Special Instructions Will Be Listed Below (If Applicable). If you need a refill on your cardiac medications before your next appointment, please call your pharmacy.

## 2018-02-28 DIAGNOSIS — L820 Inflamed seborrheic keratosis: Secondary | ICD-10-CM | POA: Diagnosis not present

## 2018-03-06 DIAGNOSIS — Z79899 Other long term (current) drug therapy: Secondary | ICD-10-CM | POA: Diagnosis not present

## 2018-03-06 DIAGNOSIS — K5090 Crohn's disease, unspecified, without complications: Secondary | ICD-10-CM | POA: Diagnosis not present

## 2018-03-06 DIAGNOSIS — M0589 Other rheumatoid arthritis with rheumatoid factor of multiple sites: Secondary | ICD-10-CM | POA: Diagnosis not present

## 2018-03-06 DIAGNOSIS — H209 Unspecified iridocyclitis: Secondary | ICD-10-CM | POA: Diagnosis not present

## 2018-03-06 DIAGNOSIS — Z85828 Personal history of other malignant neoplasm of skin: Secondary | ICD-10-CM | POA: Diagnosis not present

## 2018-03-06 DIAGNOSIS — Q600 Renal agenesis, unilateral: Secondary | ICD-10-CM | POA: Diagnosis not present

## 2018-03-06 DIAGNOSIS — M65322 Trigger finger, left index finger: Secondary | ICD-10-CM | POA: Diagnosis not present

## 2018-03-06 DIAGNOSIS — M545 Low back pain: Secondary | ICD-10-CM | POA: Diagnosis not present

## 2018-03-06 DIAGNOSIS — Z6841 Body Mass Index (BMI) 40.0 and over, adult: Secondary | ICD-10-CM | POA: Diagnosis not present

## 2018-03-14 ENCOUNTER — Ambulatory Visit (INDEPENDENT_AMBULATORY_CARE_PROVIDER_SITE_OTHER): Payer: Medicare Other | Admitting: Pharmacist

## 2018-03-14 DIAGNOSIS — Z5181 Encounter for therapeutic drug level monitoring: Secondary | ICD-10-CM

## 2018-03-14 DIAGNOSIS — I480 Paroxysmal atrial fibrillation: Secondary | ICD-10-CM

## 2018-03-14 LAB — POCT INR: INR: 3 (ref 2.0–3.0)

## 2018-03-24 ENCOUNTER — Other Ambulatory Visit: Payer: Self-pay | Admitting: Family Medicine

## 2018-03-26 MED ORDER — WARFARIN SODIUM 2.5 MG PO TABS: ORAL_TABLET | ORAL | 0 refills | Status: DC

## 2018-04-03 ENCOUNTER — Other Ambulatory Visit: Payer: Self-pay | Admitting: Cardiology

## 2018-04-03 MED ORDER — WARFARIN SODIUM 2.5 MG PO TABS: ORAL_TABLET | ORAL | 1 refills | Status: DC

## 2018-04-03 NOTE — Telephone Encounter (Signed)
New message:        *STAT* If patient is at the pharmacy, call can be transferred to refill team.   1. Which medications need to be refilled? (please list name of each medication and dose if known) warfarin (COUMADIN) 2.5 MG table  2. Which pharmacy/location (including street and city if local pharmacy) is medication to be sent to?[id number] - [place], [place] - [address]  3. Do they need a 30 day or 90 day supply? [place]

## 2018-04-05 DIAGNOSIS — M0589 Other rheumatoid arthritis with rheumatoid factor of multiple sites: Secondary | ICD-10-CM | POA: Diagnosis not present

## 2018-04-05 DIAGNOSIS — Z79899 Other long term (current) drug therapy: Secondary | ICD-10-CM | POA: Diagnosis not present

## 2018-04-25 ENCOUNTER — Ambulatory Visit (INDEPENDENT_AMBULATORY_CARE_PROVIDER_SITE_OTHER): Payer: Medicare Other | Admitting: Pharmacist

## 2018-04-25 DIAGNOSIS — I480 Paroxysmal atrial fibrillation: Secondary | ICD-10-CM

## 2018-04-25 DIAGNOSIS — Z5181 Encounter for therapeutic drug level monitoring: Secondary | ICD-10-CM | POA: Diagnosis not present

## 2018-04-25 LAB — POCT INR: INR: 2.5 (ref 2.0–3.0)

## 2018-04-27 ENCOUNTER — Other Ambulatory Visit: Payer: Self-pay | Admitting: Cardiology

## 2018-05-07 ENCOUNTER — Encounter: Payer: Self-pay | Admitting: Pulmonary Disease

## 2018-05-07 ENCOUNTER — Ambulatory Visit (INDEPENDENT_AMBULATORY_CARE_PROVIDER_SITE_OTHER): Payer: Medicare Other | Admitting: Pulmonary Disease

## 2018-05-07 DIAGNOSIS — G4733 Obstructive sleep apnea (adult) (pediatric): Secondary | ICD-10-CM

## 2018-05-07 DIAGNOSIS — I2510 Atherosclerotic heart disease of native coronary artery without angina pectoris: Secondary | ICD-10-CM | POA: Diagnosis not present

## 2018-05-07 NOTE — Progress Notes (Signed)
   Subjective:    Patient ID: [name], female    DOB: April 18, 1946, 72 y.o.   MRN: [id number]  HPI 72 yo female with severe OSA on CPAP  Sees dr Amil Amen for RA - on remicaide A Fib on Coumadin   She has been maintained on CPAP for many years and is settled with a full facemask.  CPAP is certainly helped improve her daytime somnolence and fatigue.  No problems with mask or pressure.  The only problem she has is a full facemask made lesion indentation on her nasal bridge. CPAP download was reviewed which shows excellent compliance more than 8 hours per night on 12 cm with very few residual events and minimal leak.  Atrial fibrillation has been controlled on Tikosyn and she remains on warfarin.  She has balance issues and had a recent fall was accidental with no injuries  Significant tests/ events reviewed  PSG 2008 >. AHI 36/h Auto 2012 - optimal pr 12 cm , FF mask   Review of Systems Patient denies significant dyspnea,cough, hemoptysis,  chest pain, palpitations, pedal edema, orthopnea, paroxysmal nocturnal dyspnea, lightheadedness, nausea, vomiting, abdominal or  leg pains      Objective:   Physical Exam   Gen. Pleasant, obese, in no distress ENT - class 2 airway, no post nasal drip Neck: No JVD, no thyromegaly, no carotid bruits Lungs: no use of accessory muscles, no dullness to percussion, decreased without rales or rhonchi  Cardiovascular: Rhythm regular, heart sounds  normal, no murmurs or gallops, no peripheral edema Musculoskeletal: No deformities, no cyanosis or clubbing , no tremors        Assessment & Plan:

## 2018-05-07 NOTE — Assessment & Plan Note (Signed)
CPAP is set at 12 cm and is working well. Trial of air fit F 30 fullface mask next time you are due for mask change CPAP supplies will be renewed for a year  Weight loss encouraged, compliance with goal of at least 4-6 hrs every night is the expectation. Advised against medications with sedative side effects Cautioned against driving when sleepy - understanding that sleepiness will vary on a day to day basis

## 2018-05-07 NOTE — Patient Instructions (Signed)
CPAP is set at 12 cm and is working well. Trial of air fit F 30 fullface mask next time you are due for mask change

## 2018-05-07 NOTE — Assessment & Plan Note (Signed)
Benefits of CPAP for atrial fibrillation were discussed

## 2018-05-17 DIAGNOSIS — M0589 Other rheumatoid arthritis with rheumatoid factor of multiple sites: Secondary | ICD-10-CM | POA: Diagnosis not present

## 2018-05-25 ENCOUNTER — Other Ambulatory Visit: Payer: Self-pay

## 2018-05-25 MED ORDER — ESCITALOPRAM OXALATE 20 MG PO TABS: ORAL_TABLET | ORAL | 1 refills | Status: DC

## 2018-05-31 ENCOUNTER — Ambulatory Visit (INDEPENDENT_AMBULATORY_CARE_PROVIDER_SITE_OTHER): Payer: Medicare Other | Admitting: Family Medicine

## 2018-05-31 VITALS — BP 138/64 | HR 54 | Temp 98.4°F | Resp 18 | Ht 63.0 in | Wt 248.2 lb

## 2018-05-31 DIAGNOSIS — I159 Secondary hypertension, unspecified: Secondary | ICD-10-CM | POA: Diagnosis not present

## 2018-05-31 DIAGNOSIS — B351 Tinea unguium: Secondary | ICD-10-CM

## 2018-05-31 DIAGNOSIS — F341 Dysthymic disorder: Secondary | ICD-10-CM

## 2018-05-31 DIAGNOSIS — I480 Paroxysmal atrial fibrillation: Secondary | ICD-10-CM

## 2018-05-31 DIAGNOSIS — R739 Hyperglycemia, unspecified: Secondary | ICD-10-CM | POA: Diagnosis not present

## 2018-05-31 DIAGNOSIS — I2510 Atherosclerotic heart disease of native coronary artery without angina pectoris: Secondary | ICD-10-CM | POA: Diagnosis not present

## 2018-05-31 DIAGNOSIS — E785 Hyperlipidemia, unspecified: Secondary | ICD-10-CM

## 2018-05-31 DIAGNOSIS — I10 Essential (primary) hypertension: Secondary | ICD-10-CM | POA: Diagnosis not present

## 2018-05-31 LAB — COMPREHENSIVE METABOLIC PANEL
ALBUMIN: 3.8 g/dL (ref 3.5–5.2)
ALK PHOS: 64 U/L (ref 39–117)
ALT: 16 U/L (ref 0–35)
AST: 16 U/L (ref 0–37)
BUN: 20 mg/dL (ref 6–23)
CALCIUM: 9.2 mg/dL (ref 8.4–10.5)
CO2: 28 mEq/L (ref 19–32)
CREATININE: 1.08 mg/dL (ref 0.40–1.20)
Chloride: 105 mEq/L (ref 96–112)
GFR: 52.89 mL/min — ABNORMAL LOW (ref >60.00)
Glucose, Bld: 92 mg/dL (ref 70–99)
POTASSIUM: 4.2 meq/L (ref 3.5–5.1)
Sodium: 140 mEq/L (ref 135–145)
TOTAL PROTEIN: 7.2 g/dL (ref 6.0–8.3)
Total Bilirubin: 0.5 mg/dL (ref 0.2–1.2)

## 2018-05-31 LAB — LIPID PANEL
CHOLESTEROL: 172 mg/dL (ref 0–200)
HDL: 45 mg/dL (ref >39.00)
LDL CALC: 96 mg/dL (ref 0–99)
NonHDL: 127.23
Total CHOL/HDL Ratio: 4
Triglycerides: 154 mg/dL — ABNORMAL HIGH (ref 0.0–149.0)
VLDL: 30.8 mg/dL (ref 0.0–40.0)

## 2018-05-31 LAB — CBC
HEMATOCRIT: 39.3 % (ref 36.0–46.0)
Hemoglobin: 13.1 g/dL (ref 12.0–15.0)
MCHC: 33.3 g/dL (ref 30.0–36.0)
MCV: 96.6 fl (ref 78.0–100.0)
PLATELETS: 190 10*3/uL (ref 150.0–400.0)
RBC: 4.07 Mil/uL (ref 3.87–5.11)
RDW: 13.5 % (ref 11.5–15.5)
WBC: 7.1 10*3/uL (ref 4.0–10.5)

## 2018-05-31 LAB — HEMOGLOBIN A1C: Hgb A1c MFr Bld: 5.6 % (ref 4.6–6.5)

## 2018-05-31 LAB — TSH: TSH: 3 u[IU]/mL (ref 0.35–4.50)

## 2018-05-31 NOTE — Progress Notes (Signed)
Subjective:  I acted as a Education administrator for Dr. Charlett Blake. [name], [place]  Patient ID: [name], female    DOB: June 10, 1946, 72 y.o.   MRN: [id number]  Chief Complaint  Patient presents with  . Follow-up    HPI  Patient is in today for a 6 month follow up. She is following up on her HTN, hyperlipidemia, and other medical concerns. She c/o minor headaches but they are infrequent and not debilitating. No recent febrile illness or hospitalizations. No polyuria or polydipsia. Has been under stress with a grandson who was in a bad accident. He is recovering but has kept the patient very worried. Denies CP/palp/SOB/HA/congestion/fevers/GI or GU c/o. Taking meds as prescribed   Patient Care Team: [name], MD as PCP - General (Family Medicine) Stanford Breed Denice Bors, MD as PCP - Cardiology (Cardiology) Gatha Mayer, MD as Consulting Physician (Gastroenterology) Rigoberto Noel, MD as Consulting Physician (Pulmonary Disease) Calvert Cantor, MD as Consulting Physician (Ophthalmology) Hennie Duos, MD as Consulting Physician (Rheumatology) [place], Jennefer Bravo, MD as Referring Physician (Dermatology)   Past Medical History:  Diagnosis Date  . Anxiety   . Arthritis   . CAD (coronary artery disease)   . Crohn's colitis ([place])    she reports ulcerative colitis beginning in her 102's, biopsies 2008 suggest Crohn's not UC  . Depression   . GERD (gastroesophageal reflux disease)   . Hyperlipidemia   . Hypertension   . Keloid of skin   . Obesity   . OSA (obstructive sleep apnea)   . Pancreatitis   . Paroxysmal atrial fibrillation (H[facility])   . Pseudoaneurysm of right femoral artery ([place])   . Renal oncocytoma    s/p right nephrectomy  . Rheumatoid arthritis ([place]) 05/29/2017    Past Surgical History:  Procedure Laterality Date  . BREAST BIOPSY  1996   benign lesion   . Cataract surgery Left 11/13/14  . CHOLECYSTECTOMY  10/2006  . COLONOSCOPY W/ BIOPSIES    . CORONARY ANGIOPLASTY    .  ESOPHAGOGASTRODUODENOSCOPY    . NEPHRECTOMY  10/2006   right secondary to oncocytoma   . RIGHT FEMORAL  PSEUDOANEURYSM & RIGHT GROIN HEMATOMA EVACUATION  02/2007  . RIGHT RENAL ARTERY REPAIR  1976  . TUBAL LIGATION      Family History  Problem Relation Age of Onset  . Colitis Father   . Heart disease Father   . Hypertension Mother   . Coronary artery disease Mother   . Stroke Mother   . Clotting disorder Mother   . Heart disease Mother   . Allergies Mother   . Stroke Brother   . Colon cancer Paternal Aunt   . Clotting disorder Brother   . Clotting disorder Sister   . Asthma Daughter   . Asthma Maternal Uncle   . Colon polyps Neg Hx   . Kidney disease Neg Hx     Social History   Socioeconomic History  . Marital status: Married    Spouse name: Not on file  . Number of children: 3  . Years of education: Not on file  . Highest education level: Not on file  Occupational History  . 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 Financial resource strain: Not on file  . Food insecurity:    Worry: Not on file    Inability: Not on file  . Transportation needs:    Medical: Not on file  Non-medical: Not on file  Tobacco Use  . Smoking status: Never Smoker  . Smokeless tobacco: Never Used  Substance and Sexual Activity  . Alcohol use: No  . Drug use: No  . Sexual activity: Never  Lifestyle  . Physical activity:    Days per week: Not on file    Minutes per session: Not on file  . Stress: Not on file  Relationships  . Social connections:    Talks on phone: Not on file    Gets together: Not on file    Attends religious service: Not on file    Active member of club or organization: Not on file    Attends meetings of clubs or organizations: Not on file    Relationship status: Not on file  . Intimate partner violence:    Fear of current or ex partner: Not on file    Emotionally abused: Not on file    Physically abused: Not on file     Forced sexual activity: Not on file  Other Topics Concern  . 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 acetaminophen (TYLENOL) 325 MG tablet Take 650 mg by mouth every 6 (six) hours as needed.    Marland Kitchen amLODipine (NORVASC) 10 MG tablet TAKE 1 TABLET BY MOUTH EVERY DAY 90 tablet 3  . dofetilide (TIKOSYN) 125 MCG capsule TAKE 3 CAPSULES BY MOUTH TWICE A DAY 540 capsule 3  . doxazosin (CARDURA) 2 MG tablet Take 1 tablet (2 mg total) by mouth daily. 30 tablet 3  . escitalopram (LEXAPRO) 20 MG tablet TAKE 1/2 A TABLET (10 MG TOTAL) BY MOUTH DAILY. 45 tablet 1  . esomeprazole (NEXIUM) 40 MG capsule Take 1 capsule (40 mg total) by mouth daily. Take in the morning before breakfast. 30 capsule 11  . hydrocortisone (ANUSOL-HC) 25 MG suppository UNWRAP AND INSERT 1 SUPPOSITORY RECTALLY AT BEDTIME AS NEEDED FOR HEMORRHOIDS OR ITCHING 12 suppository 1  . InFLIXimab (REMICADE IV) Inject into the vein as directed.    . loperamide (IMODIUM A-D) 2 MG tablet Take 1 mg by mouth daily as needed. Dihrrhea    . losartan (COZAAR) 100 MG tablet TAKE 1 TABLET (100 MG TOTAL) BY MOUTH DAILY. 90 tablet 3  . metoprolol tartrate (LOPRESSOR) 25 MG tablet TAKE 1 TABLET BY MOUTH TWICE A DAY 180 tablet 3  . Multiple Vitamin (MULTIVITAMIN) capsule Take 1 capsule by mouth daily. 1 daily      . nitroGLYCERIN (NITROSTAT) 0.4 MG SL tablet Place 1 tablet (0.4 mg total) under the tongue every 5 (five) minutes as needed. 1 tab under tongue every 5 min for chest pain not to exceed 3 tabs in 15 min 25 tablet 6  . Probiotic Product ([place]) CAPS Take 1 capsule by mouth daily. 1 per day    . Vitamin D, Cholecalciferol, 1000 units TABS Take by mouth daily.    Marland Kitchen warfarin (COUMADIN) 2.5 MG tablet TAKE 1/2 to 1 tablet daily  as directed by coumadin clinic 90 tablet 1   No facility-administered medications prior to visit.     Allergies  Allergen Reactions  . Hydralazine Diarrhea and Nausea Only    Anorexia, upset stomach, burning pain in abdomin  . Cefdinir  Pt cannot take; may interfere with AFib.  . Codeine     Heart beats fast   . Guaifenesin Er Nausea And Vomiting and Other (See Comments)    Headaches; can tolerate liquid.  . Latex     Breathing problems  . Mucilgen [Psyllium]   . Percocet [Oxycodone-Acetaminophen]     Itching & swelling in jaw    Review of Systems  Constitutional: Positive for malaise/fatigue. Negative for fever.  HENT: Negative for congestion.   Eyes: Negative for blurred vision.  Respiratory: Negative for shortness of breath.   Cardiovascular: Negative for chest pain, palpitations and leg swelling.  Gastrointestinal: Negative for abdominal pain, blood in stool and nausea.  Genitourinary: Negative for dysuria and frequency.  Musculoskeletal: Negative for falls.  Skin: Negative for rash.  Neurological: Negative for dizziness and loss of consciousness.  Endo/Heme/Allergies: Negative for environmental allergies.  Psychiatric/Behavioral: Positive for depression. The patient is not nervous/anxious.        Objective:    Physical Exam  Constitutional: She is oriented to person, place, and time. She appears well-developed and well-nourished. No distress.  HENT:  Head: Normocephalic and atraumatic.  Nose: Nose normal.  Eyes: Right eye exhibits no discharge. Left eye exhibits no discharge.  Neck: Normal range of motion. Neck supple.  Cardiovascular: Normal rate and regular rhythm.  No murmur heard. Pulmonary/Chest: Effort normal and breath sounds normal.  Abdominal: Soft. Bowel sounds are normal. There is no tenderness.  Musculoskeletal: She exhibits no edema.  Neurological: She is alert and oriented to person, place, and time.  Skin: Skin is warm and dry.    Psychiatric: She has a normal mood and affect.  Nursing note and vitals reviewed.   BP 138/64 (BP Location: Left Arm, Patient Position: Sitting, Cuff Size: Large)   Pulse (!) 54   Temp 98.4 F (36.9 C) (Oral)   Resp 18   Ht 5\' 3"  (1.6 m)   Wt 248 lb 3.2 oz (112.6 kg)   SpO2 95%   BMI 43.97 kg/m  Wt Readings from Last 3 Encounters:  05/31/18 248 lb 3.2 oz (112.6 kg)  05/07/18 246 lb (111.6 kg)  02/14/18 235 lb (106.6 kg)   BP Readings from Last 3 Encounters:  05/31/18 138/64  05/07/18 118/82  02/14/18 (!) 141/70     Immunization History  Administered Date(s) Administered  . Influenza Split 07/23/2012  . Influenza Whole 09/06/2007, 08/11/2008, 08/24/2009, 08/03/2010  . Influenza, High Dose Seasonal PF 08/02/2013, 06/29/2016, 07/06/2017  . Influenza,inj,Quad PF,6+ Mos 06/27/2014, 06/26/2015  . Pneumococcal Conjugate-13 08/28/2014  . Pneumococcal Polysaccharide-23 09/13/2010  . Td 05/12/2009  . Zoster 02/11/2008    Health Maintenance  Topic Date Due  . FOOT EXAM  08/22/1955  . OPHTHALMOLOGY EXAM  08/22/1955  . COLONOSCOPY  09/10/2014  . INFLUENZA VACCINE  05/24/2018  . HEMOGLOBIN A1C  12/01/2018  . TETANUS/TDAP  05/13/2019  . MAMMOGRAM  12/01/2019  . DEXA SCAN  Completed  . Hepatitis C Screening  Completed  . PNA vac Low Risk Adult  Completed    Lab Results  Component Value Date   WBC 7.1 05/31/2018   HGB 13.1 05/31/2018   HCT 39.3 05/31/2018   PLT 190.0 05/31/2018   GLUCOSE 92 05/31/2018   CHOL 172 05/31/2018   TRIG 154.0 (H) 05/31/2018   HDL 45.00 05/31/2018   LDLCALC 96 05/31/2018   ALT 16 05/31/2018   AST 16 05/31/2018   NA 140 05/31/2018   K 4.2 05/31/2018   CL 105  05/31/2018   CREATININE 1.08 05/31/2018   BUN 20 05/31/2018   CO2 28 05/31/2018   TSH 3.00 05/31/2018   INR 2.5 04/25/2018   HGBA1C 5.6 05/31/2018    Lab Results  Component Value Date   TSH 3.00 05/31/2018   Lab Results  Component Value Date   WBC 7.1 05/31/2018   HGB 13.1  05/31/2018   HCT 39.3 05/31/2018   MCV 96.6 05/31/2018   PLT 190.0 05/31/2018   Lab Results  Component Value Date   NA 140 05/31/2018   K 4.2 05/31/2018   CO2 28 05/31/2018   GLUCOSE 92 05/31/2018   BUN 20 05/31/2018   CREATININE 1.08 05/31/2018   BILITOT 0.5 05/31/2018   ALKPHOS 64 05/31/2018   AST 16 05/31/2018   ALT 16 05/31/2018   PROT 7.2 05/31/2018   ALBUMIN 3.8 05/31/2018   CALCIUM 9.2 05/31/2018   ANIONGAP 11 11/13/2015   GFR 52.89 (L) 05/31/2018   Lab Results  Component Value Date   CHOL 172 05/31/2018   Lab Results  Component Value Date   HDL 45.00 05/31/2018   Lab Results  Component Value Date   LDLCALC 96 05/31/2018   Lab Results  Component Value Date   TRIG 154.0 (H) 05/31/2018   Lab Results  Component Value Date   CHOLHDL 4 05/31/2018   Lab Results  Component Value Date   HGBA1C 5.6 05/31/2018         Assessment & Plan:   Problem List Items Addressed This Visit    Hyperlipidemia    Encouraged heart healthy diet, increase exercise, avoid trans fats, consider a krill oil cap daily      Relevant Orders   Lipid panel (Completed)   DEPRESSION/ANXIETY    Has been under a great deal of stress as her grandson was in a bad bus accident while on a vacation to [place] fortunately he is improving. She has managed fairly well      Essential hypertension   Relevant Orders   CBC (Completed)   Comprehensive metabolic panel (Completed)   TSH (Completed)   ATRIAL FIBRILLATION    Rate controlled, tolerating coumadin       Hyperglycemia    minimize simple carbs. Increase exercise as tolerated.       Relevant Orders   Hemoglobin A1c (Completed)   Hypertension    Well controlled, no changes to meds. Encouraged heart healthy diet such as the DASH diet and exercise as tolerated.       Relevant Orders   CBC (Completed)   Comprehensive metabolic panel (Completed)   TSH (Completed)   Onychomycosis - Primary    Thickened toenails having  trouble caring for them, referred to podiatry for further care      Relevant Orders   Ambulatory referral to Podiatry      I am having [name] maintain her loperamide, multivitamin, [facility], InFLIXimab (REMICADE IV), nitroGLYCERIN, acetaminophen, Vitamin D (Cholecalciferol), losartan, hydrocortisone, dofetilide, esomeprazole, doxazosin, warfarin, amLODipine, metoprolol tartrate, and escitalopram.  No orders of the defined types were placed in this encounter.   CMA served as Education administrator during this visit. History, Physical and Plan performed by medical provider. Documentation and orders reviewed and attested to.  Penni Homans, MD

## 2018-05-31 NOTE — Assessment & Plan Note (Signed)
Well controlled, no changes to meds. Encouraged heart healthy diet such as the DASH diet and exercise as tolerated.  °

## 2018-05-31 NOTE — Assessment & Plan Note (Signed)
Encouraged heart healthy diet, increase exercise, avoid trans fats, consider a krill oil cap daily 

## 2018-05-31 NOTE — Assessment & Plan Note (Signed)
Rate controlled, tolerating coumadin 

## 2018-05-31 NOTE — Patient Instructions (Signed)
neutrogena T gel shampoo twice a week Cholesterol Cholesterol is a white, waxy, fat-like substance that is needed by the human body in small amounts. The liver makes all the cholesterol we need. Cholesterol is carried from the liver by the blood through the blood vessels. Deposits of cholesterol (plaques) may build up on blood vessel (artery) walls. Plaques make the arteries narrower and stiffer. Cholesterol plaques increase the risk for heart attack and stroke. You cannot feel your cholesterol level even if it is very high. The only way to know that it is high is to have a blood test. Once you know your cholesterol levels, you should keep a record of the test results. Work with your health care provider to keep your levels in the desired range. What do the results mean?  Total cholesterol is a rough measure of all the cholesterol in your blood.  LDL (low-density lipoprotein) is the "bad" cholesterol. This is the type that causes plaque to build up on the artery walls. You want this level to be low.  HDL (high-density lipoprotein) is the "good" cholesterol because it cleans the arteries and carries the LDL away. You want this level to be high.  Triglycerides are fat that the body can either burn for energy or store. High levels are closely linked to heart disease. What are the desired levels of cholesterol?  Total cholesterol below 200.  LDL below 100 for people who are at risk, below 70 for people at very high risk.  HDL above 40 is good. A level of 60 or higher is considered to be protective against heart disease.  Triglycerides below 150. How can I lower my cholesterol? Diet Follow your diet program as told by your health care provider.  Choose fish or white meat chicken and [place], roasted or baked. Limit fatty cuts of red meat, fried foods, and processed meats, such as sausage and lunch meats.  Eat lots of fresh fruits and vegetables.  Choose whole grains, beans, pasta, potatoes,  and cereals.  Choose olive oil, corn oil, or canola oil, and use only small amounts.  Avoid butter, mayonnaise, shortening, or palm kernel oils.  Avoid foods with trans fats.  Drink skim or nonfat milk and eat low-fat or nonfat yogurt and cheeses. Avoid whole milk, cream, ice cream, egg yolks, and full-fat cheeses.  Healthier desserts include angel food cake, ginger snaps, animal crackers, hard candy, popsicles, and low-fat or nonfat frozen yogurt. Avoid pastries, cakes, pies, and cookies.  Exercise  Follow your exercise program as told by your health care provider. A regular program: ? Helps to decrease LDL and raise HDL. ? Helps with weight control.  Do things that increase your activity level, such as gardening, walking, and taking the stairs.  Ask your health care provider about ways that you can be more active in your daily life.  Medicine  Take over-the-counter and prescription medicines only as told by your health care provider. ? Medicine may be prescribed by your health care provider to help lower cholesterol and decrease the risk for heart disease. This is usually done if diet and exercise have failed to bring down cholesterol levels. ? If you have several risk factors, you may need medicine even if your levels are normal.  This information is not intended to replace advice given to you by your health care provider. Make sure you discuss any questions you have with your health care provider. Document Released: 07/05/2001 Document Revised: 05/07/2016 Document Reviewed: 04/09/2016 Elsevier Interactive Patient  Education  2018 Elsevier Inc.  

## 2018-05-31 NOTE — Assessment & Plan Note (Signed)
minimize simple carbs. Increase exercise as tolerated.  

## 2018-06-03 DIAGNOSIS — B351 Tinea unguium: Secondary | ICD-10-CM | POA: Insufficient documentation

## 2018-06-03 NOTE — Assessment & Plan Note (Signed)
Has been under a great deal of stress as her grandson was in a bad bus accident while on a vacation to [place] fortunately he is improving. She has managed fairly well

## 2018-06-03 NOTE — Assessment & Plan Note (Signed)
Thickened toenails having trouble caring for them, referred to podiatry for further care

## 2018-06-06 ENCOUNTER — Ambulatory Visit (INDEPENDENT_AMBULATORY_CARE_PROVIDER_SITE_OTHER): Payer: Medicare Other | Admitting: Pharmacist

## 2018-06-06 DIAGNOSIS — Z5181 Encounter for therapeutic drug level monitoring: Secondary | ICD-10-CM

## 2018-06-06 DIAGNOSIS — I480 Paroxysmal atrial fibrillation: Secondary | ICD-10-CM | POA: Diagnosis not present

## 2018-06-06 LAB — POCT INR: INR: 3 (ref 2.0–3.0)

## 2018-06-06 MED ORDER — NITROGLYCERIN 0.4 MG SL SUBL: 0.4000 mg | SUBLINGUAL_TABLET | SUBLINGUAL | 1 refills | Status: DC | PRN

## 2018-06-11 ENCOUNTER — Ambulatory Visit (INDEPENDENT_AMBULATORY_CARE_PROVIDER_SITE_OTHER): Payer: Medicare Other | Admitting: Podiatry

## 2018-06-11 ENCOUNTER — Encounter: Payer: Self-pay | Admitting: Podiatry

## 2018-06-11 VITALS — BP 120/64 | HR 51 | Resp 15 | Ht 63.0 in | Wt 245.0 lb

## 2018-06-11 DIAGNOSIS — D689 Coagulation defect, unspecified: Secondary | ICD-10-CM | POA: Diagnosis not present

## 2018-06-11 DIAGNOSIS — B351 Tinea unguium: Secondary | ICD-10-CM

## 2018-06-11 DIAGNOSIS — M069 Rheumatoid arthritis, unspecified: Secondary | ICD-10-CM | POA: Diagnosis not present

## 2018-06-11 DIAGNOSIS — D2272 Melanocytic nevi of left lower limb, including hip: Secondary | ICD-10-CM | POA: Diagnosis not present

## 2018-06-11 NOTE — Progress Notes (Signed)
Subjective:  Patient ID: [name], female    DOB: [birth year],  MRN: [id number]  Chief Complaint  Patient presents with  . debride    B/L nail trimming -PT not diabetic -pt on BT - ABN signed    72 y.o. female presents with the above complaint. Requests care of her nails today. States that she cannot reach them. On Coumadin for Afib - stable per patient. Hx of RA and Crohn's on Remicade. All well controlled per patient. Denies DM.   Review of Systems: Negative except as noted in the HPI. Denies N/V/F/Ch.  Past Medical History:  Diagnosis Date  . Anxiety   . Arthritis   . CAD (coronary artery disease)   . Crohn's colitis ([place])    she reports ulcerative colitis beginning in her 40's, biopsies 2008 suggest Crohn's not UC  . Depression   . GERD (gastroesophageal reflux disease)   . Hyperlipidemia   . Hypertension   . Keloid of skin   . Obesity   . OSA (obstructive sleep apnea)   . Pancreatitis   . Paroxysmal atrial fibrillation ([facility])   . Pseudoaneurysm of right femoral artery ([place])   . Renal oncocytoma    s/p right nephrectomy  . Rheumatoid arthritis ([place]) 05/29/2017    Current Outpatient Medications:  .  acetaminophen (TYLENOL) 325 MG tablet, Take 650 mg by mouth every 6 (six) hours as needed., Disp: , Rfl:  .  amLODipine (NORVASC) 10 MG tablet, TAKE 1 TABLET BY MOUTH EVERY DAY, Disp: 90 tablet, Rfl: 3 .  dofetilide (TIKOSYN) 125 MCG capsule, TAKE 3 CAPSULES BY MOUTH TWICE A DAY, Disp: 540 capsule, Rfl: 3 .  doxazosin (CARDURA) 2 MG tablet, Take 1 tablet (2 mg total) by mouth daily., Disp: 30 tablet, Rfl: 3 .  escitalopram (LEXAPRO) 20 MG tablet, TAKE 1/2 A TABLET (10 MG TOTAL) BY MOUTH DAILY., Disp: 45 tablet, Rfl: 1 .  esomeprazole (NEXIUM) 40 MG capsule, Take 1 capsule (40 mg total) by mouth daily. Take in the morning before breakfast., Disp: 30 capsule, Rfl: 11 .  hydrocortisone (ANUSOL-HC) 25 MG suppository, UNWRAP AND INSERT 1 SUPPOSITORY RECTALLY AT BEDTIME AS  NEEDED FOR HEMORRHOIDS OR ITCHING, Disp: 12 suppository, Rfl: 1 .  InFLIXimab (REMICADE IV), Inject into the vein as directed., Disp: , Rfl:  .  loperamide (IMODIUM A-D) 2 MG tablet, Take 1 mg by mouth daily as needed. Dihrrhea, Disp: , Rfl:  .  losartan (COZAAR) 100 MG tablet, TAKE 1 TABLET (100 MG TOTAL) BY MOUTH DAILY., Disp: 90 tablet, Rfl: 3 .  metoprolol tartrate (LOPRESSOR) 25 MG tablet, TAKE 1 TABLET BY MOUTH TWICE A DAY, Disp: 180 tablet, Rfl: 3 .  Multiple Vitamin (MULTIVITAMIN) capsule, Take 1 capsule by mouth daily. 1 daily  , Disp: , Rfl:  .  nitroGLYCERIN (NITROSTAT) 0.4 MG SL tablet, Place 1 tablet (0.4 mg total) under the tongue every 5 (five) minutes as needed for chest pain. Do not exceed 3 tabs in 15 minutes, Disp: 25 tablet, Rfl: 1 .  Probiotic Product ([place]) CAPS, Take 1 capsule by mouth daily. 1 per day, Disp: , Rfl:  .  Vitamin D, Cholecalciferol, 1000 units TABS, Take by mouth daily., Disp: , Rfl:  .  warfarin (COUMADIN) 2.5 MG tablet, TAKE 1/2 to 1 tablet daily as directed by coumadin clinic, Disp: 90 tablet, Rfl: 1  Social History   Tobacco Use  Smoking Status Never Smoker  Smokeless Tobacco Never Used    Allergies  Allergen Reactions  . Hydralazine Diarrhea and Nausea Only    Anorexia, upset stomach, burning pain in abdomin  . Cefdinir     Pt cannot take; may interfere with AFib.  . Codeine     Heart beats fast   . Guaifenesin Er Nausea And Vomiting and Other (See Comments)    Headaches; can tolerate liquid.  . Latex     Breathing problems  . Mucilgen [Psyllium]   . Percocet [Oxycodone-Acetaminophen]     Itching & swelling in jaw   Objective:   Vitals:   06/11/18 1506  BP: 120/64  Pulse: (!) 51  Resp: 15   Body mass index is 43.4 kg/m. Constitutional Well developed. Well nourished.  Vascular Dorsalis pedis pulses palpable bilaterally. Posterior tibial pulses palpable bilaterally. Capillary refill normal to all digits.  No  cyanosis or clubbing noted. Pedal hair growth normal.  Neurologic Normal speech. Oriented to person, place, and time. Epicritic sensation to light touch grossly present bilaterally.  Dermatologic Nails elongated, thickened, dystrophic. No open wounds.  Atypical nevus L lower leg - irregular borders.  Orthopedic: Normal joint ROM without pain or crepitus bilaterally. No visible deformities. No bony tenderness.   Radiographs: None Assessment:   1. 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 Rheumatoid arthritis, involving unspecified site, unspecified rheumatoid factor presence ([place])   4. Nevus of left lower leg    Plan:  Patient was evaluated and treated and all questions answered.  Onychomycosis on Anticoagulant. -Nails debrided x10  Procedure: Nail Debridement Rationale: Patient meets criteria for routine foot care due to coagulation defect. Type of Debridement: manual, sharp debridement. Instrumentation: Nail nipper, rotary burr. Number of Nails: 10  Nevus L lower leg -Discussed possible biopsy. Patient is followed by a dermatologist. -Will hold off at this time. -Should we consider biopsy at risk of bleeding due to Coumadin use.  Return in about 3 months (around 09/11/2018) for Nail care - on Anticoagulant.

## 2018-06-28 DIAGNOSIS — M0589 Other rheumatoid arthritis with rheumatoid factor of multiple sites: Secondary | ICD-10-CM | POA: Diagnosis not present

## 2018-07-06 DIAGNOSIS — Q600 Renal agenesis, unilateral: Secondary | ICD-10-CM | POA: Diagnosis not present

## 2018-07-06 DIAGNOSIS — H209 Unspecified iridocyclitis: Secondary | ICD-10-CM | POA: Diagnosis not present

## 2018-07-06 DIAGNOSIS — Z6841 Body Mass Index (BMI) 40.0 and over, adult: Secondary | ICD-10-CM | POA: Diagnosis not present

## 2018-07-06 DIAGNOSIS — Z79899 Other long term (current) drug therapy: Secondary | ICD-10-CM | POA: Diagnosis not present

## 2018-07-06 DIAGNOSIS — M65322 Trigger finger, left index finger: Secondary | ICD-10-CM | POA: Diagnosis not present

## 2018-07-06 DIAGNOSIS — M0589 Other rheumatoid arthritis with rheumatoid factor of multiple sites: Secondary | ICD-10-CM | POA: Diagnosis not present

## 2018-07-06 DIAGNOSIS — M545 Low back pain: Secondary | ICD-10-CM | POA: Diagnosis not present

## 2018-07-06 DIAGNOSIS — Z85828 Personal history of other malignant neoplasm of skin: Secondary | ICD-10-CM | POA: Diagnosis not present

## 2018-07-06 DIAGNOSIS — K5090 Crohn's disease, unspecified, without complications: Secondary | ICD-10-CM | POA: Diagnosis not present

## 2018-07-18 ENCOUNTER — Ambulatory Visit (INDEPENDENT_AMBULATORY_CARE_PROVIDER_SITE_OTHER): Payer: Medicare Other | Admitting: Pharmacist

## 2018-07-18 DIAGNOSIS — Z5181 Encounter for therapeutic drug level monitoring: Secondary | ICD-10-CM

## 2018-07-18 DIAGNOSIS — I480 Paroxysmal atrial fibrillation: Secondary | ICD-10-CM

## 2018-07-18 LAB — POCT INR: INR: 3.5 — AB (ref 2.0–3.0)

## 2018-07-25 ENCOUNTER — Other Ambulatory Visit: Payer: Self-pay | Admitting: Cardiology

## 2018-07-30 ENCOUNTER — Other Ambulatory Visit: Payer: Self-pay | Admitting: Cardiology

## 2018-07-30 NOTE — Progress Notes (Signed)
HPI: FU coronary artery disease and atrial fibrillation. Last cardiac catheterization on Mar 06, 2007 showed an ejection fraction of 55%. There is nonobstructive plaque in the LAD, and circumflex and the right coronary artery was normal. 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 Also note the patient is extremely sensitive to Coumadin. MRA of the abdomen in September of 2012 showed no left renal artery stenosis and previous right nephrectomy. 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 Echocardiogram in June of 2014 showed normal LV function, grade 1 diastolic dysfunction and mild left atrial enlargement. Carotid Dopplers in March 2017 showed 1-39% stenosis bilaterally.Event monitor January 2018 showed sinus with PACs, PVCs and ectopic atrial tachycardia.Since I last saw her,she denies dyspnea, exertional chest pain, palpitations or syncope.  No bleeding.  Current Outpatient Medications  Medication Sig Dispense Refill  . acetaminophen (TYLENOL) 325 MG tablet Take 650 mg by mouth every 6 (six) hours as needed.    Marland Kitchen amLODipine (NORVASC) 10 MG tablet TAKE 1 TABLET BY MOUTH EVERY DAY 90 tablet 3  . dofetilide (TIKOSYN) 125 MCG capsule TAKE 3 CAPSULES BY MOUTH TWICE A DAY 540 capsule 3  . doxazosin (CARDURA) 2 MG tablet Take 1 tablet (2 mg total) by mouth daily. 30 tablet 3  . escitalopram (LEXAPRO) 20 MG tablet TAKE 1/2 A TABLET (10 MG TOTAL) BY MOUTH DAILY. 45 tablet 1  . esomeprazole (NEXIUM) 40 MG capsule Take 1 capsule (40 mg total) by mouth daily. Take in the morning before breakfast. 30 capsule 11  . hydrocortisone (ANUSOL-HC) 25 MG suppository UNWRAP AND INSERT 1 SUPPOSITORY RECTALLY AT BEDTIME AS NEEDED FOR HEMORRHOIDS OR ITCHING 12 suppository 1  . InFLIXimab (REMICADE IV) Inject into the vein as directed.    . loperamide (IMODIUM A-D) 2 MG tablet Take 1 mg by mouth  daily as needed. Dihrrhea    . losartan (COZAAR) 100 MG tablet TAKE 1 TABLET (100 MG TOTAL) BY MOUTH DAILY. 90 tablet 1  . metoprolol tartrate (LOPRESSOR) 25 MG tablet TAKE 1 TABLET BY MOUTH TWICE A DAY 180 tablet 3  . Multiple Vitamin (MULTIVITAMIN) capsule Take 1 capsule by mouth daily. 1 daily      . nitroGLYCERIN (NITROSTAT) 0.4 MG SL tablet PLEASE SEE ATTACHED FOR DETAILED DIRECTIONS 25 tablet 6  . Probiotic Product ([place]) CAPS Take 1 capsule by mouth daily. 1 per day    . warfarin (COUMADIN) 2.5 MG tablet TAKE 1/2 to 1 tablet daily as directed by coumadin clinic 90 tablet 1   No current facility-administered medications for this visit.      Past Medical History:  Diagnosis Date  . Anxiety   . Arthritis   . CAD (coronary artery disease)   . Crohn's colitis ([place])    she reports ulcerative colitis beginning in her 25's, biopsies 2008 suggest Crohn's not UC  . Depression   . GERD (gastroesophageal reflux disease)   . Hyperlipidemia   . Hypertension   . Keloid of skin   . Obesity   . OSA (obstructive sleep apnea)   . Pancreatitis   . Paroxysmal atrial fibrillation (HCC)   . 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 Renal oncocytoma    s/p right nephrectomy  . Rheumatoid arthritis ([place]) 05/29/2017    Past Surgical History:  Procedure Laterality Date  . [place]  benign lesion   . Cataract surgery Left 11/13/14  . CHOLECYSTECTOMY  10/2006  . COLONOSCOPY W/ BIOPSIES    . CORONARY ANGIOPLASTY    . ESOPHAGOGASTRODUODENOSCOPY    . NEPHRECTOMY  10/2006   right secondary to oncocytoma   . RIGHT FEMORAL  PSEUDOANEURYSM & RIGHT GROIN HEMATOMA EVACUATION  02/2007  . RIGHT RENAL ARTERY REPAIR  1976  . TUBAL LIGATION      Social History   Socioeconomic History  . Marital status: Married    Spouse name: Not on file  . Number of children: 3  . Years of education: Not on file  . Highest education level: Not on file  Occupational History  .  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 Financial resource strain: Not on file  . Food insecurity:    Worry: Not on file    Inability: Not on file  . Transportation needs:    Medical: Not on file    Non-medical: Not on file  Tobacco Use  . Smoking status: Never Smoker  . Smokeless tobacco: Never Used  Substance and Sexual Activity  . Alcohol use: No  . Drug use: No  . Sexual activity: Never  Lifestyle  . Physical activity:    Days per week: Not on file    Minutes per session: Not on file  . Stress: Not on file  Relationships  . Social connections:    Talks on phone: Not on file    Gets together: Not on file    Attends religious service: Not on file    Active member of club or organization: Not on file    Attends meetings of clubs or organizations: Not on file    Relationship status: Not on file  . Intimate partner violence:    Fear of current or ex partner: Not on file    Emotionally abused: Not on file    Physically abused: Not on file    Forced sexual activity: Not on file  Other Topics Concern  . 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 Colitis Father   . Heart disease Father   . Hypertension Mother   . Coronary artery disease Mother   . Stroke Mother   . Clotting disorder Mother   . Heart disease Mother   . Allergies Mother   . Stroke Brother   . Colon cancer Paternal Aunt   . Clotting disorder Brother   . Clotting disorder Sister   . Asthma Daughter   . Asthma Maternal Uncle   . Colon polyps Neg Hx   . Kidney disease Neg Hx     ROS: Arthralgias but no fevers or chills, productive cough, hemoptysis, dysphasia, odynophagia, melena, hematochezia, dysuria, hematuria, rash, seizure activity, orthopnea, PND, pedal edema, claudication.  Remaining systems are negative.  Physical Exam: Well-developed well-nourished in no acute distress.  Skin is warm and dry.  HEENT is normal.  Neck is supple.  Chest is clear to auscultation with normal expansion.  Cardiovascular exam is regular rate and rhythm.  Abdominal exam nontender or distended. No masses palpated. Extremities show no edema. neuro grossly intact  ECG-sinus bradycardia at a rate of 47.  No ST changes.  Personally reviewed  A/P  1 paroxysmal atrial fibrillation-patient continues to do well on Tikosyn.  We will continue at present dose.  Check magnesium.  Continue Coumadin.  Recent laboratories show potassium 4.2 and creatinine 1.08.  Hemoglobin 13.1.  Notes she is describing some fatigue.  Heart rate mildly decreased.  Decrease metoprolol from 25 mg twice daily to 12.5 mg twice daily and follow.  2 hypertension-patient's blood pressure is controlled.  Continue present medications.  3 hyperlipidemia-patient is intolerant to statins.  Continue diet.  Not interested in other lipid lowering medications.  4 coronary artery disease-no aspirin given need for anticoagulation.  Intolerant to statins.  5 carotid artery disease-mild on most recent Dopplers.  Kirk Ruths, MD

## 2018-07-31 ENCOUNTER — Ambulatory Visit (INDEPENDENT_AMBULATORY_CARE_PROVIDER_SITE_OTHER): Payer: Medicare Other | Admitting: Family Medicine

## 2018-07-31 ENCOUNTER — Encounter: Payer: Self-pay | Admitting: Family Medicine

## 2018-07-31 DIAGNOSIS — M810 Age-related osteoporosis without current pathological fracture: Secondary | ICD-10-CM | POA: Diagnosis not present

## 2018-07-31 DIAGNOSIS — Z23 Encounter for immunization: Secondary | ICD-10-CM | POA: Diagnosis not present

## 2018-07-31 DIAGNOSIS — H04123 Dry eye syndrome of bilateral lacrimal glands: Secondary | ICD-10-CM | POA: Diagnosis not present

## 2018-07-31 DIAGNOSIS — I480 Paroxysmal atrial fibrillation: Secondary | ICD-10-CM

## 2018-07-31 NOTE — Patient Instructions (Addendum)
[place] Allergy drops and/or Systane lubricating eye drops  Flonase/Fluticasone nasal steroid spray, 2 sprays each nostril daily if no relief call opthamology Cholesterol Cholesterol is a white, waxy, fat-like substance that is needed by the human body in small amounts. The liver makes all the cholesterol we need. Cholesterol is carried from the liver by the blood through the blood vessels. Deposits of cholesterol (plaques) may build up on blood vessel (artery) walls. Plaques make the arteries narrower and stiffer. Cholesterol plaques increase the risk for heart attack and stroke. You cannot feel your cholesterol level even if it is very high. The only way to know that it is high is to have a blood test. Once you know your cholesterol levels, you should keep a record of the test results. Work with your health care provider to keep your levels in the desired range. What do the results mean?  Total cholesterol is a rough measure of all the cholesterol in your blood.  LDL (low-density lipoprotein) is the "bad" cholesterol. This is the type that causes plaque to build up on the artery walls. You want this level to be low.  HDL (high-density lipoprotein) is the "good" cholesterol because it cleans the arteries and carries the LDL away. You want this level to be high.  Triglycerides are fat that the body can either burn for energy or store. High levels are closely linked to heart disease. What are the desired levels of cholesterol?  Total cholesterol below 200.  LDL below 100 for people who are at risk, below 70 for people at very high risk.  HDL above 40 is good. A level of 60 or higher is considered to be protective against heart disease.  Triglycerides below 150. How can I lower my cholesterol? Diet Follow your diet program as told by your health care provider.  Choose fish or white meat chicken and [place], roasted or baked. Limit fatty cuts of red meat, fried foods, and processed meats, such  as sausage and lunch meats.  Eat lots of fresh fruits and vegetables.  Choose whole grains, beans, pasta, potatoes, and cereals.  Choose olive oil, corn oil, or canola oil, and use only small amounts.  Avoid butter, mayonnaise, shortening, or palm kernel oils.  Avoid foods with trans fats.  Drink skim or nonfat milk and eat low-fat or nonfat yogurt and cheeses. Avoid whole milk, cream, ice cream, egg yolks, and full-fat cheeses.  Healthier desserts include angel food cake, ginger snaps, animal crackers, hard candy, popsicles, and low-fat or nonfat frozen yogurt. Avoid pastries, cakes, pies, and cookies.  Exercise  Follow your exercise program as told by your health care provider. A regular program: ? Helps to decrease LDL and raise HDL. ? Helps with weight control.  Do things that increase your activity level, such as gardening, walking, and taking the stairs.  Ask your health care provider about ways that you can be more active in your daily life.  Medicine  Take over-the-counter and prescription medicines only as told by your health care provider. ? Medicine may be prescribed by your health care provider to help lower cholesterol and decrease the risk for heart disease. This is usually done if diet and exercise have failed to bring down cholesterol levels. ? If you have several risk factors, you may need medicine even if your levels are normal.  This information is not intended to replace advice given to you by your health care provider. Make sure you discuss any questions you have with  your health care provider. Document Released: 07/05/2001 Document Revised: 05/07/2016 Document Reviewed: 04/09/2016 Elsevier Interactive Patient Education  [employer].

## 2018-08-03 ENCOUNTER — Ambulatory Visit (INDEPENDENT_AMBULATORY_CARE_PROVIDER_SITE_OTHER): Payer: Medicare Other | Admitting: Pharmacist Clinician (PhC)/ Clinical Pharmacy Specialist

## 2018-08-03 DIAGNOSIS — I480 Paroxysmal atrial fibrillation: Secondary | ICD-10-CM | POA: Diagnosis not present

## 2018-08-03 DIAGNOSIS — Z5181 Encounter for therapeutic drug level monitoring: Secondary | ICD-10-CM

## 2018-08-03 LAB — POCT INR: INR: 3.6 — AB (ref 2.0–3.0)

## 2018-08-03 NOTE — Patient Instructions (Signed)
Description   No warfarin today Friday Oct 11, then decrease dose to 1 tablet daily except 1/2 tablet each Monday, Wednesday and Friday.   repeat INR in 10 days

## 2018-08-05 DIAGNOSIS — H04123 Dry eye syndrome of bilateral lacrimal glands: Secondary | ICD-10-CM | POA: Insufficient documentation

## 2018-08-05 NOTE — Assessment & Plan Note (Signed)
Encouraged DASH diet, decrease po intake and increase exercise as tolerated. Needs 7-8 hours of sleep nightly. Avoid trans fats, eat small, frequent meals every 4-5 hours with lean proteins, complex carbs and healthy fats. Minimize simple carbs, GMO foods. 

## 2018-08-05 NOTE — Assessment & Plan Note (Signed)
RRR today 

## 2018-08-05 NOTE — Assessment & Plan Note (Signed)
encouraged to try Systane and if no improvement then try and contact your opthamologist.

## 2018-08-05 NOTE — Progress Notes (Signed)
Subjective:    Patient ID: [name], female    DOB: Apr 29, 1946, 72 y.o.   MRN: [id number]  No chief complaint on file.   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 No severe allergies. Well controlled, no changes to meds. Encouraged heart healthy diet such as the DASH diet and exercise as tolerated.   Past Medical History:  Diagnosis Date  . Anxiety   . Arthritis   . CAD (coronary artery disease)   . Crohn's colitis ([place])    she reports ulcerative colitis beginning in her 64's, biopsies 2008 suggest Crohn's not UC  . Depression   . GERD (gastroesophageal reflux disease)   . Hyperlipidemia   . Hypertension   . Keloid of skin   . Obesity   . OSA (obstructive sleep apnea)   . Pancreatitis   . Paroxysmal atrial fibrillation (HCC)   . 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 Renal oncocytoma    s/p right nephrectomy  . Rheumatoid arthritis ([place]) 05/29/2017    Past Surgical History:  Procedure Laterality Date  . BREAST BIOPSY  1996   benign lesion   . Cataract surgery Left 11/13/14  . CHOLECYSTECTOMY  10/2006  . COLONOSCOPY W/ BIOPSIES    . CORONARY ANGIOPLASTY    . ESOPHAGOGASTRODUODENOSCOPY    . NEPHRECTOMY  10/2006   right secondary to oncocytoma   . RIGHT FEMORAL  PSEUDOANEURYSM & RIGHT GROIN HEMATOMA EVACUATION  02/2007  . RIGHT RENAL ARTERY REPAIR  1976  . TUBAL LIGATION      Family History  Problem Relation Age of Onset  . Colitis Father   . Heart disease Father   . Hypertension Mother   . Coronary artery disease Mother   . Stroke Mother   . Clotting disorder Mother   . Heart disease Mother   . Allergies Mother   . Stroke Brother   . Colon cancer Paternal Aunt   . Clotting disorder Brother   . Clotting disorder Sister   . Asthma Daughter   . Asthma Maternal Uncle   . Colon polyps Neg Hx   . Kidney disease Neg Hx     Social History   Socioeconomic History  . Marital  status: Married    Spouse name: Not on file  . Number of children: 3  . Years of education: Not on file  . Highest education level: Not on file  Occupational History  . 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 Financial resource strain: Not on file  . Food insecurity:    Worry: Not on file    Inability: Not on file  . Transportation needs:    Medical: Not on file    Non-medical: Not on file  Tobacco Use  . Smoking status: Never Smoker  . Smokeless tobacco: Never Used  Substance and Sexual Activity  . Alcohol use: No  . Drug use: No  . Sexual activity: Never  Lifestyle  . Physical activity:    Days per week: Not on file    Minutes per session: Not on file  . Stress: Not on file  Relationships  . Social connections:    Talks on phone: Not on file    Gets together: Not on file    Attends religious service: Not on file    Active member of club or organization: Not on  file    Attends meetings of clubs or organizations: Not on file    Relationship status: Not on file  . Intimate partner violence:    Fear of current or ex partner: Not on file    Emotionally abused: Not on file    Physically abused: Not on file    Forced sexual activity: Not on file  Other Topics Concern  . 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 acetaminophen (TYLENOL) 325 MG tablet Take 650 mg by mouth every 6 (six) hours as needed.    Marland Kitchen amLODipine (NORVASC) 10 MG tablet TAKE 1 TABLET BY MOUTH EVERY DAY 90 tablet 3  . dofetilide (TIKOSYN) 125 MCG capsule TAKE 3 CAPSULES BY MOUTH TWICE A DAY 540 capsule 3  . doxazosin (CARDURA) 2 MG tablet Take 1 tablet (2 mg total) by mouth daily. 30 tablet 3  . escitalopram (LEXAPRO) 20 MG tablet TAKE 1/2 A TABLET (10 MG  TOTAL) BY MOUTH DAILY. 45 tablet 1  . esomeprazole (NEXIUM) 40 MG capsule Take 1 capsule (40 mg total) by mouth daily. Take in the morning before breakfast. 30 capsule 11  . hydrocortisone (ANUSOL-HC) 25 MG suppository UNWRAP AND INSERT 1 SUPPOSITORY RECTALLY AT BEDTIME AS NEEDED FOR HEMORRHOIDS OR ITCHING 12 suppository 1  . InFLIXimab (REMICADE IV) Inject into the vein as directed.    . loperamide (IMODIUM A-D) 2 MG tablet Take 1 mg by mouth daily as needed. Dihrrhea    . losartan (COZAAR) 100 MG tablet TAKE 1 TABLET (100 MG TOTAL) BY MOUTH DAILY. 90 tablet 1  . metoprolol tartrate (LOPRESSOR) 25 MG tablet TAKE 1 TABLET BY MOUTH TWICE A DAY 180 tablet 3  . Multiple Vitamin (MULTIVITAMIN) capsule Take 1 capsule by mouth daily. 1 daily      . nitroGLYCERIN (NITROSTAT) 0.4 MG SL tablet PLEASE SEE ATTACHED FOR DETAILED DIRECTIONS 25 tablet 6  . Probiotic Product ([place]) CAPS Take 1 capsule by mouth daily. 1 per day    . Vitamin D, Cholecalciferol, 1000 units TABS Take by mouth daily.    Marland Kitchen warfarin (COUMADIN) 2.5 MG tablet TAKE 1/2 to 1 tablet daily as directed by coumadin clinic 90 tablet 1   No facility-administered medications prior to visit.     Allergies  Allergen Reactions  . Hydralazine Diarrhea and Nausea Only    Anorexia, upset stomach, burning pain in abdomin  . Cefdinir     Pt cannot take; may interfere with AFib.  . Codeine     Heart beats fast   . Guaifenesin Er Nausea And Vomiting and Other (See Comments)    Headaches; can tolerate liquid.  . Latex     Breathing problems  . Mucilgen [Psyllium]   . Percocet [Oxycodone-Acetaminophen]     Itching & swelling in jaw    Review of Systems  Constitutional: Negative for fever.  HENT: Negative for congestion.   Eyes: Negative for blurred vision.  Respiratory: Negative for cough.   Cardiovascular: Negative for chest pain and palpitations.  Gastrointestinal: Negative for vomiting.  Musculoskeletal: Negative  for back pain.  Skin: Negative for rash.  Neurological: Negative for loss of consciousness and headaches.       Objective:    Physical Exam  Constitutional:  She is oriented to person, place, and time. She appears well-developed and well-nourished. No distress.  HENT:  Head: Normocephalic and atraumatic.  Nose: Nose normal.  Eyes: Right eye exhibits no discharge. Left eye exhibits no discharge.  Neck: Normal range of motion. Neck supple.  Cardiovascular: Normal rate, regular rhythm and normal heart sounds.  No murmur heard. Pulmonary/Chest: Effort normal and breath sounds normal.  Abdominal: Soft. Bowel sounds are normal. There is no tenderness.  Musculoskeletal: She exhibits no edema.  Neurological: She is alert and oriented to person, place, and time.  Skin: Skin is warm and dry.  Psychiatric: She has a normal mood and affect.  Nursing note and vitals reviewed.   BP 128/70 (BP Location: Left Arm, Patient Position: Sitting, Cuff Size: Normal)   Pulse (!) 49   Temp 97.7 F (36.5 C) (Oral)   Resp 18   Wt 249 lb 9.6 oz (113.2 kg)   SpO2 97%   BMI 44.21 kg/m  Wt Readings from Last 3 Encounters:  07/31/18 249 lb 9.6 oz (113.2 kg)  06/11/18 245 lb (111.1 kg)  05/31/18 248 lb 3.2 oz (112.6 kg)     Lab Results  Component Value Date   WBC 7.1 05/31/2018   HGB 13.1 05/31/2018   HCT 39.3 05/31/2018   PLT 190.0 05/31/2018   GLUCOSE 92 05/31/2018   CHOL 172 05/31/2018   TRIG 154.0 (H) 05/31/2018   HDL 45.00 05/31/2018   LDLCALC 96 05/31/2018   ALT 16 05/31/2018   AST 16 05/31/2018   NA 140 05/31/2018   K 4.2 05/31/2018   CL 105 05/31/2018   CREATININE 1.08 05/31/2018   BUN 20 05/31/2018   CO2 28 05/31/2018   TSH 3.00 05/31/2018   INR 3.6 (A) 08/03/2018   HGBA1C 5.6 05/31/2018    Lab Results  Component Value Date   TSH 3.00 05/31/2018   Lab Results  Component Value Date   WBC 7.1 05/31/2018   HGB 13.1 05/31/2018   HCT 39.3 05/31/2018   MCV 96.6 05/31/2018     PLT 190.0 05/31/2018   Lab Results  Component Value Date   NA 140 05/31/2018   K 4.2 05/31/2018   CO2 28 05/31/2018   GLUCOSE 92 05/31/2018   BUN 20 05/31/2018   CREATININE 1.08 05/31/2018   BILITOT 0.5 05/31/2018   ALKPHOS 64 05/31/2018   AST 16 05/31/2018   ALT 16 05/31/2018   PROT 7.2 05/31/2018   ALBUMIN 3.8 05/31/2018   CALCIUM 9.2 05/31/2018   ANIONGAP 11 11/13/2015   GFR 52.89 (L) 05/31/2018   Lab Results  Component Value Date   CHOL 172 05/31/2018   Lab Results  Component Value Date   HDL 45.00 05/31/2018   Lab Results  Component Value Date   LDLCALC 96 05/31/2018   Lab Results  Component Value Date   TRIG 154.0 (H) 05/31/2018   Lab Results  Component Value Date   CHOLHDL 4 05/31/2018   Lab Results  Component Value Date   HGBA1C 5.6 05/31/2018       Assessment & Plan:   Problem List Items Addressed This Visit    Osteoporosis    Encouraged DASH diet, decrease po intake and increase exercise as tolerated. Needs 7-8 hours of sleep nightly. Avoid trans fats, eat small, frequent meals every 4-5 hours with lean proteins, complex carbs and healthy fats. Minimize simple carbs, GMO foods.      Paroxysmal atrial fibrillation (HCC)    RRR today  Dry eyes    encouraged to try Systane and if no improvement then try and contact your opthamologist.          I am having Sharita Bienaime. Maynes maintain her loperamide, multivitamin, [facility], InFLIXimab (REMICADE IV), acetaminophen, Vitamin D (Cholecalciferol), hydrocortisone, dofetilide, esomeprazole, doxazosin, warfarin, amLODipine, metoprolol tartrate, escitalopram, losartan, and nitroGLYCERIN.  No orders of the defined types were placed in this encounter.   Penni Homans, MD

## 2018-08-13 ENCOUNTER — Encounter: Payer: Self-pay | Admitting: Cardiology

## 2018-08-13 ENCOUNTER — Ambulatory Visit (INDEPENDENT_AMBULATORY_CARE_PROVIDER_SITE_OTHER): Payer: Medicare Other | Admitting: Cardiology

## 2018-08-13 ENCOUNTER — Ambulatory Visit (INDEPENDENT_AMBULATORY_CARE_PROVIDER_SITE_OTHER): Payer: Medicare Other | Admitting: Pharmacist Clinician (PhC)/ Clinical Pharmacy Specialist

## 2018-08-13 VITALS — BP 128/68 | HR 47 | Ht 63.0 in | Wt 246.0 lb

## 2018-08-13 DIAGNOSIS — I480 Paroxysmal atrial fibrillation: Secondary | ICD-10-CM | POA: Diagnosis not present

## 2018-08-13 DIAGNOSIS — I10 Essential (primary) hypertension: Secondary | ICD-10-CM

## 2018-08-13 DIAGNOSIS — E7800 Pure hypercholesterolemia, unspecified: Secondary | ICD-10-CM

## 2018-08-13 DIAGNOSIS — I2510 Atherosclerotic heart disease of native coronary artery without angina pectoris: Secondary | ICD-10-CM

## 2018-08-13 DIAGNOSIS — M0589 Other rheumatoid arthritis with rheumatoid factor of multiple sites: Secondary | ICD-10-CM | POA: Diagnosis not present

## 2018-08-13 DIAGNOSIS — Z5181 Encounter for therapeutic drug level monitoring: Secondary | ICD-10-CM | POA: Diagnosis not present

## 2018-08-13 LAB — POCT INR: INR: 3.3 — AB (ref 2.0–3.0)

## 2018-08-13 MED ORDER — METOPROLOL TARTRATE 25 MG PO TABS: 12.5000 mg | ORAL_TABLET | Freq: Two times a day (BID) | ORAL | 3 refills | Status: DC

## 2018-08-13 NOTE — Patient Instructions (Signed)
Medication Instructions:  DECREASE METOPROLOL TO 12.5 MG TWICE DAILY= 1/2 OF THE 25 MG TABLET ONCE DAILY If you need a refill on your cardiac medications before your next appointment, please call your pharmacy.   Lab work: Your physician recommends that you HAVE LAB WORK TODAY If you have labs (blood work) drawn today and your tests are completely normal, you will receive your results only by: Marland Kitchen MyChart Message (if you have MyChart) OR . A paper copy in the mail If you have any lab test that is abnormal or we need to change your treatment, we will call you to review the results.  Follow-Up: At [facility], you and your health needs are our priority.  As part of our continuing mission to provide you with exceptional heart care, we have created designated Provider Care Teams.  These Care Teams include your primary Cardiologist (physician) and Advanced Practice Providers (APPs -  Physician Assistants and Nurse Practitioners) who all work together to provide you with the care you need, when you need it. You will need a follow up appointment in 12 months.  Please call our office 2 months in advance to schedule this appointment.  You may see Kirk Ruths, MD or one of the following Advanced Practice Providers on your designated Care Team:   Kerin Ransom, PA-C Roby Lofts, [place] . Sande Rives, PA-C

## 2018-08-14 LAB — MAGNESIUM: MAGNESIUM: 2.2 mg/dL (ref 1.6–2.3)

## 2018-08-18 ENCOUNTER — Other Ambulatory Visit: Payer: Self-pay | Admitting: Cardiology

## 2018-08-27 ENCOUNTER — Ambulatory Visit (INDEPENDENT_AMBULATORY_CARE_PROVIDER_SITE_OTHER): Payer: Medicare Other | Admitting: Pharmacist

## 2018-08-27 DIAGNOSIS — Z5181 Encounter for therapeutic drug level monitoring: Secondary | ICD-10-CM | POA: Diagnosis not present

## 2018-08-27 DIAGNOSIS — I480 Paroxysmal atrial fibrillation: Secondary | ICD-10-CM

## 2018-08-27 LAB — POCT INR: INR: 2.1 (ref 2.0–3.0)

## 2018-09-10 ENCOUNTER — Ambulatory Visit: Payer: Medicare Other | Admitting: Podiatry

## 2018-09-17 ENCOUNTER — Other Ambulatory Visit: Payer: Self-pay | Admitting: Family Medicine

## 2018-09-17 MED ORDER — GABAPENTIN 100 MG PO CAPS: 100.0000 mg | ORAL_CAPSULE | Freq: Every day | ORAL | 2 refills | Status: DC

## 2018-09-24 DIAGNOSIS — M0589 Other rheumatoid arthritis with rheumatoid factor of multiple sites: Secondary | ICD-10-CM | POA: Diagnosis not present

## 2018-09-24 DIAGNOSIS — Z79899 Other long term (current) drug therapy: Secondary | ICD-10-CM | POA: Diagnosis not present

## 2018-09-26 ENCOUNTER — Telehealth: Payer: Self-pay

## 2018-09-26 ENCOUNTER — Ambulatory Visit (INDEPENDENT_AMBULATORY_CARE_PROVIDER_SITE_OTHER): Payer: Medicare Other | Admitting: Pharmacist

## 2018-09-26 DIAGNOSIS — Z5181 Encounter for therapeutic drug level monitoring: Secondary | ICD-10-CM | POA: Diagnosis not present

## 2018-09-26 DIAGNOSIS — I480 Paroxysmal atrial fibrillation: Secondary | ICD-10-CM

## 2018-09-26 DIAGNOSIS — I4891 Unspecified atrial fibrillation: Secondary | ICD-10-CM

## 2018-09-26 LAB — POCT INR: INR: 2 (ref 2.0–3.0)

## 2018-09-26 NOTE — Telephone Encounter (Signed)
Called to schedule overdue inr and did not see that the pt is actually coming to the clinic today error.

## 2018-09-28 ENCOUNTER — Other Ambulatory Visit: Payer: Self-pay | Admitting: Pharmacist Clinician (PhC)/ Clinical Pharmacy Specialist

## 2018-09-28 MED ORDER — WARFARIN SODIUM 2.5 MG PO TABS: ORAL_TABLET | ORAL | 1 refills | Status: DC

## 2018-10-24 ENCOUNTER — Other Ambulatory Visit: Payer: Self-pay | Admitting: Cardiology

## 2018-10-29 ENCOUNTER — Ambulatory Visit (INDEPENDENT_AMBULATORY_CARE_PROVIDER_SITE_OTHER): Payer: Medicare Other | Admitting: Family Medicine

## 2018-10-29 ENCOUNTER — Encounter: Payer: Self-pay | Admitting: Family Medicine

## 2018-10-29 VITALS — BP 126/82 | HR 58 | Temp 98.4°F | Resp 16 | Ht 63.0 in | Wt 245.4 lb

## 2018-10-29 DIAGNOSIS — J0100 Acute maxillary sinusitis, unspecified: Secondary | ICD-10-CM | POA: Diagnosis not present

## 2018-10-29 DIAGNOSIS — H6502 Acute serous otitis media, left ear: Secondary | ICD-10-CM | POA: Diagnosis not present

## 2018-10-29 MED ORDER — OFLOXACIN 0.3 % OT SOLN: 10.0000 [drp] | Freq: Every day | OTIC | 0 refills | Status: AC

## 2018-10-29 MED ORDER — AMOXICILLIN 875 MG PO TABS: 875.0000 mg | ORAL_TABLET | Freq: Two times a day (BID) | ORAL | 0 refills | Status: DC

## 2018-10-29 NOTE — Progress Notes (Signed)
Patient ID: [name], female    DOB: [birth year]  Age: 73 y.o. MRN: [id number]    Subjective:  Subjective  HPI ANGELIN [name] presents for sinus congestion , pressure and L ear pressure    Pt took tylenol , simply saline with no relief.    Review of Systems  Constitutional: Negative for appetite change, diaphoresis, fatigue and unexpected weight change.  HENT: Positive for congestion, ear pain, postnasal drip, rhinorrhea, sinus pressure and sinus pain. Negative for sneezing, sore throat and tinnitus.   Eyes: Negative for pain, redness and visual disturbance.  Respiratory: Negative for cough, chest tightness, shortness of breath and wheezing.   Cardiovascular: Negative for chest pain, palpitations and leg swelling.  Endocrine: Negative for cold intolerance, heat intolerance, polydipsia, polyphagia and polyuria.  Genitourinary: Negative for difficulty urinating, dysuria and frequency.  Neurological: Positive for light-headedness. Negative for dizziness, numbness and headaches.    History Past Medical History:  Diagnosis Date  . Anxiety   . Arthritis   . CAD (coronary artery disease)   . Crohn's colitis ([place])    she reports ulcerative colitis beginning in her 61's, biopsies 2008 suggest Crohn's not UC  . Depression   . GERD (gastroesophageal reflux disease)   . Hyperlipidemia   . Hypertension   . Keloid of skin   . Obesity   . OSA (obstructive sleep apnea)   . Pancreatitis   . Paroxysmal atrial fibrillation (H[facility])   . 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 Renal oncocytoma    s/p right nephrectomy  . Rheumatoid arthritis ([place]) 05/29/2017    She has a past surgical history that includes Nephrectomy (10/2006); Breast biopsy (1996); Cholecystectomy (10/2006); Tubal ligation; RIGHT RENAL ARTERY REPAIR (1976); RIGHT FEMORAL  PSEUDOANEURYSM & RIGHT GROIN HEMATOMA EVACUATION (02/2007); Colonoscopy w/ biopsies; Esophagogastroduodenoscopy; Coronary angioplasty; and Cataract  surgery (Left, 11/13/14).   Her family history includes Allergies in her mother; Asthma in her daughter and maternal uncle; Clotting disorder in her brother, mother, and sister; Colitis in her father; Colon cancer in her paternal aunt; Coronary artery disease in her mother; Heart disease in her father and mother; Hypertension in her mother; Stroke in her brother and mother.She reports that she has never smoked. She has never used smokeless tobacco. She reports that she does not drink alcohol or use drugs.  Current Outpatient Medications on File Prior to Visit  Medication Sig Dispense Refill  . acetaminophen (TYLENOL) 325 MG tablet Take 650 mg by mouth every 6 (six) hours as needed.    Marland Kitchen amLODipine (NORVASC) 10 MG tablet TAKE 1 TABLET BY MOUTH EVERY DAY 90 tablet 3  . dofetilide (TIKOSYN) 125 MCG capsule TAKE 3 CAPSULES BY MOUTH TWICE A DAY 180 capsule 10  . doxazosin (CARDURA) 2 MG tablet Take 1 tablet (2 mg total) by mouth daily. 30 tablet 3  . escitalopram (LEXAPRO) 20 MG tablet TAKE 1/2 A TABLET (10 MG TOTAL) BY MOUTH DAILY. 45 tablet 1  . esomeprazole (NEXIUM) 40 MG capsule Take 1 capsule (40 mg total) by mouth daily. Take in the morning before breakfast. 30 capsule 11  . hydrocortisone (ANUSOL-HC) 25 MG suppository UNWRAP AND INSERT 1 SUPPOSITORY RECTALLY AT BEDTIME AS NEEDED FOR HEMORRHOIDS OR ITCHING 12 suppository 1  . InFLIXimab (REMICADE IV) Inject into the vein as directed.    . loperamide (IMODIUM A-D) 2 MG tablet Take 1 mg by mouth daily as needed. Dihrrhea    . losartan (COZAAR) 100 MG tablet TAKE 1  TABLET (100 MG TOTAL) BY MOUTH DAILY. 90 tablet 1  . metoprolol tartrate (LOPRESSOR) 25 MG tablet Take 0.5 tablets (12.5 mg total) by mouth 2 (two) times daily. 90 tablet 3  . Multiple Vitamin (MULTIVITAMIN) capsule Take 1 capsule by mouth daily. 1 daily      . nitroGLYCERIN (NITROSTAT) 0.4 MG SL tablet Place 1 tablet (0.4 mg total) under the tongue every 5 (five) minutes as needed for  chest pain. Do not exceed 3 tabs in 15 minutes 75 tablet 3  . Probiotic Product ([place]) CAPS Take 1 capsule by mouth daily. 1 per day    . warfarin (COUMADIN) 2.5 MG tablet TAKE 1/2 to 1 tablet daily as directed by coumadin clinic 90 tablet 1  . gabapentin (NEURONTIN) 100 MG capsule Take 1-3 capsules (100-300 mg total) by mouth at bedtime. (Patient not taking: Reported on 10/29/2018) 90 capsule 2   No current facility-administered medications on file prior to visit.      Objective:  Objective  Physical Exam Vitals signs and nursing note reviewed.  Constitutional:      Appearance: She is well-developed.  HENT:     Head: Normocephalic and atraumatic.     Right Ear: Decreased hearing noted. Tympanic membrane is not erythematous or bulging.     Left Ear: Decreased hearing noted. A middle ear effusion is present. Tympanic membrane is bulging.     Ears:   Eyes:     Conjunctiva/sclera: Conjunctivae normal.  Neck:     Musculoskeletal: Normal range of motion and neck supple.     Thyroid: No thyromegaly.     Vascular: No carotid bruit or JVD.  Cardiovascular:     Rate and Rhythm: Normal rate and regular rhythm.     Heart sounds: Normal heart sounds. No murmur.  Pulmonary:     Effort: Pulmonary effort is normal. No respiratory distress.     Breath sounds: Normal breath sounds. No wheezing or rales.  Chest:     Chest wall: No tenderness.  Neurological:     Mental Status: She is alert and oriented to person, place, and time.    BP 126/82 (BP Location: Left Arm, Cuff Size: Large)   Pulse (!) 58   Temp 98.4 F (36.9 C) (Oral)   Resp 16   Ht 5\' 3"  (1.6 m)   Wt 245 lb 6.4 oz (111.3 kg)   SpO2 96%   BMI 43.47 kg/m  Wt Readings from Last 3 Encounters:  10/29/18 245 lb 6.4 oz (111.3 kg)  08/13/18 246 lb (111.6 kg)  07/31/18 249 lb 9.6 oz (113.2 kg)     Lab Results  Component Value Date   WBC 7.1 05/31/2018   HGB 13.1 05/31/2018   HCT 39.3 05/31/2018   PLT 190.0  05/31/2018   GLUCOSE 92 05/31/2018   CHOL 172 05/31/2018   TRIG 154.0 (H) 05/31/2018   HDL 45.00 05/31/2018   LDLCALC 96 05/31/2018   ALT 16 05/31/2018   AST 16 05/31/2018   NA 140 05/31/2018   K 4.2 05/31/2018   CL 105 05/31/2018   CREATININE 1.08 05/31/2018   BUN 20 05/31/2018   CO2 28 05/31/2018   TSH 3.00 05/31/2018   INR 2.0 09/26/2018   HGBA1C 5.6 05/31/2018    Ct Abdomen Pelvis W Contrast  Result Date: 01/17/2018 CLINICAL DATA:  Abdominal pain.  History of Crohn's disease. EXAM: CT ABDOMEN AND PELVIS WITH CONTRAST TECHNIQUE: Multidetector CT imaging of the abdomen and pelvis was performed  using the standard protocol following bolus administration of intravenous contrast. CONTRAST:  19mL ISOVUE-300 IOPAMIDOL (ISOVUE-300) INJECTION 61% COMPARISON:  None. FINDINGS: Lower chest: Heart is borderline in size. No effusions or confluent airspace opacities. Hepatobiliary: Diffuse fatty infiltration of the liver. No focal hepatic abnormality or biliary ductal dilatation. Prior cholecystectomy. Pancreas: No focal abnormality or ductal dilatation. Spleen: No focal abnormality.  Normal size. Adrenals/Urinary Tract: Prior right nephrectomy. No focal renal lesion on the left. No hydronephrosis. No adrenal mass. Urinary bladder unremarkable. Stomach/Bowel: Sigmoid diverticulosis. No active diverticulitis. Appendix is normal. Stomach and small bowel decompressed, unremarkable. Vascular/Lymphatic: Aortic atherosclerosis. No enlarged abdominal or pelvic lymph nodes. Reproductive: Uterus and adnexa unremarkable.  No mass. Other: No free fluid or free air. Musculoskeletal: No acute bony abnormality. IMPRESSION: Mild fatty infiltration of the liver. Prior cholecystectomy and right nephrectomy. Sigmoid diverticulosis.  No active diverticulitis. Aortic atherosclerosis. No acute findings in the abdomen or pelvis. Electronically Signed   By: Rolm Baptise M.D.   On: 01/17/2018 08:26     Assessment & Plan:    Plan  I am having Rendi Mapel. Santor start on amoxicillin and ofloxacin. I am also having her maintain her loperamide, multivitamin, [facility], InFLIXimab (REMICADE IV), acetaminophen, hydrocortisone, esomeprazole, doxazosin, amLODipine, escitalopram, losartan, metoprolol tartrate, nitroGLYCERIN, gabapentin, warfarin, and dofetilide.  Meds ordered this encounter  Medications  . amoxicillin (AMOXIL) 875 MG tablet    Sig: Take 1 tablet (875 mg total) by mouth 2 (two) times daily.    Dispense:  20 tablet    Refill:  0  . ofloxacin (FLOXIN) 0.3 % OTIC solution    Sig: Place 10 drops into the left ear daily for 6 days.    Dispense:  5 mL    Refill:  0    Problem List Items Addressed This Visit      Unprioritized   Sinusitis - Primary   Relevant Medications   amoxicillin (AMOXIL) 875 MG tablet    Other Visit Diagnoses    Acute serous otitis media of left ear, recurrence not specified       Relevant Medications   amoxicillin (AMOXIL) 875 MG tablet   ofloxacin (FLOXIN) 0.3 % OTIC solution    pt has flonase and claritin at home-- she will start using this as well She knows she needs to hold her remicade infusion while on antibiotics  Follow-up: Return if symptoms worsen or fail to improve.  Ann Held, DO

## 2018-10-29 NOTE — Patient Instructions (Signed)

## 2018-10-30 ENCOUNTER — Ambulatory Visit: Payer: Self-pay

## 2018-10-30 ENCOUNTER — Other Ambulatory Visit: Payer: Self-pay | Admitting: Family Medicine

## 2018-10-30 MED ORDER — DOXYCYCLINE HYCLATE 100 MG PO TABS: 100.0000 mg | ORAL_TABLET | Freq: Two times a day (BID) | ORAL | 0 refills | Status: DC

## 2018-10-30 NOTE — Telephone Encounter (Signed)
Patient notified that doxycycline was sent in and advised her about Dr. Etter Sjogren note.

## 2018-10-30 NOTE — Telephone Encounter (Signed)
  Patient called to say that after some research she realized that amoxicillin (AMOXIL) 875 MG tablet will cause Afib in patients 60+ and she also take doxazosin (CARDURA) 2 MG tablet that will cause an Interaction. She is requesting an Rx for Doxycycline sent to her pharmacy instead. Please advise PH# [phone]    Reason for Disposition . [1] Request for URGENT new prescription or refill of "essential" medication (i.e., likelihood of harm to patient if not taken) AND [2] triager unable to fill per unit policy  Protocols used: MEDICATION QUESTION CALL-A-AH

## 2018-10-30 NOTE — Telephone Encounter (Signed)
Amoxicillin is actually the safest one --- -  Doxy has more side effects But if she has had doxy--- Doxy sent

## 2018-10-30 NOTE — Addendum Note (Signed)
Addended by: Kem Boroughs D on: 10/30/2018 06:33 PM   Modules accepted: Orders

## 2018-11-05 DIAGNOSIS — M65322 Trigger finger, left index finger: Secondary | ICD-10-CM | POA: Diagnosis not present

## 2018-11-05 DIAGNOSIS — Q600 Renal agenesis, unilateral: Secondary | ICD-10-CM | POA: Diagnosis not present

## 2018-11-05 DIAGNOSIS — Z85828 Personal history of other malignant neoplasm of skin: Secondary | ICD-10-CM | POA: Diagnosis not present

## 2018-11-05 DIAGNOSIS — Z6841 Body Mass Index (BMI) 40.0 and over, adult: Secondary | ICD-10-CM | POA: Diagnosis not present

## 2018-11-05 DIAGNOSIS — Z79899 Other long term (current) drug therapy: Secondary | ICD-10-CM | POA: Diagnosis not present

## 2018-11-05 DIAGNOSIS — K5090 Crohn's disease, unspecified, without complications: Secondary | ICD-10-CM | POA: Diagnosis not present

## 2018-11-05 DIAGNOSIS — H209 Unspecified iridocyclitis: Secondary | ICD-10-CM | POA: Diagnosis not present

## 2018-11-05 DIAGNOSIS — M0589 Other rheumatoid arthritis with rheumatoid factor of multiple sites: Secondary | ICD-10-CM | POA: Diagnosis not present

## 2018-11-05 DIAGNOSIS — M545 Low back pain: Secondary | ICD-10-CM | POA: Diagnosis not present

## 2018-11-09 ENCOUNTER — Ambulatory Visit (INDEPENDENT_AMBULATORY_CARE_PROVIDER_SITE_OTHER): Payer: Medicare Other | Admitting: *Deleted

## 2018-11-09 DIAGNOSIS — Z5181 Encounter for therapeutic drug level monitoring: Secondary | ICD-10-CM | POA: Diagnosis not present

## 2018-11-09 DIAGNOSIS — I4891 Unspecified atrial fibrillation: Secondary | ICD-10-CM

## 2018-11-09 LAB — POCT INR: INR: 2.2 (ref 2.0–3.0)

## 2018-11-09 NOTE — Patient Instructions (Signed)
Description   Continue 1/2 tablet daily except 1 tablet each Monday, Wednesday and Friday.   Repeat INR in 6 weeks

## 2018-11-11 ENCOUNTER — Other Ambulatory Visit: Payer: Self-pay | Admitting: Family Medicine

## 2018-11-13 ENCOUNTER — Other Ambulatory Visit: Payer: Self-pay | Admitting: Physician Assistant

## 2018-11-18 ENCOUNTER — Other Ambulatory Visit: Payer: Self-pay

## 2018-11-18 ENCOUNTER — Emergency Department (HOSPITAL_BASED_OUTPATIENT_CLINIC_OR_DEPARTMENT_OTHER)
Admission: EM | Admit: 2018-11-18 | Discharge: 2018-11-18 | Disposition: A | Discharge status: Home / Self Care | Payer: Medicare Other | Attending: Emergency Medicine | Admitting: Emergency Medicine

## 2018-11-18 ENCOUNTER — Encounter (HOSPITAL_BASED_OUTPATIENT_CLINIC_OR_DEPARTMENT_OTHER): Payer: Self-pay | Admitting: Emergency Medicine

## 2018-11-18 DIAGNOSIS — I10 Essential (primary) hypertension: Secondary | ICD-10-CM | POA: Diagnosis not present

## 2018-11-18 DIAGNOSIS — Z79899 Other long term (current) drug therapy: Secondary | ICD-10-CM | POA: Insufficient documentation

## 2018-11-18 DIAGNOSIS — Z5189 Encounter for other specified aftercare: Secondary | ICD-10-CM

## 2018-11-18 DIAGNOSIS — R21 Rash and other nonspecific skin eruption: Secondary | ICD-10-CM | POA: Insufficient documentation

## 2018-11-18 DIAGNOSIS — I259 Chronic ischemic heart disease, unspecified: Secondary | ICD-10-CM | POA: Insufficient documentation

## 2018-11-18 DIAGNOSIS — S31000A Unspecified open wound of lower back and pelvis without penetration into retroperitoneum, initial encounter: Secondary | ICD-10-CM | POA: Diagnosis not present

## 2018-11-18 NOTE — ED Triage Notes (Signed)
Patient states that she wears incontinence pads and she saw a small area of redness to her perineal area on wed. Since then it has become more red

## 2018-11-18 NOTE — ED Notes (Signed)
ED Provider at bedside. 

## 2018-11-18 NOTE — ED Provider Notes (Signed)
[facility] EMERGENCY DEPARTMENT Provider Note   CSN: [id number] Arrival date & time: 11/18/18  1111     History   Chief Complaint Chief Complaint  Patient presents with  . Abscess    HPI [name] is a 73 y.o. female.  HPI   73 year old female presenting for wound evaluation.  This past Wednesday she noticed a small red area on the mons pubis.  Initially she noticed a spot of blood.  No further bleeding.  No pain.  She still sees the wound though and wanted it evaluated she has an upcoming Remicade infusion scheduled.  Past Medical History:  Diagnosis Date  . Anxiety   . Arthritis   . CAD (coronary artery disease)   . Crohn's colitis ([place])    she reports ulcerative colitis beginning in her 80's, biopsies 2008 suggest Crohn's not UC  . Depression   . GERD (gastroesophageal reflux disease)   . Hyperlipidemia   . Hypertension   . Keloid of skin   . Obesity   . OSA (obstructive sleep apnea)   . Pancreatitis   . Paroxysmal atrial fibrillation ([facility])   . 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 Renal oncocytoma    s/p right nephrectomy  . Rheumatoid arthritis ([place]) 05/29/2017    Patient Active Problem List   Diagnosis Date Noted  . Dry eyes 08/05/2018  . Onychomycosis 06/03/2018  . Renal oncocytoma   . 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 Paroxysmal atrial fibrillation ([facility])   . Pancreatitis   . Hypertension   . GERD (gastroesophageal reflux disease)   . CAD (coronary artery disease)   . Arthritis   . Anxiety   . Rheumatoid arthritis ([place]) 05/29/2017  . Skin lesion 05/29/2017  . Dry mouth 08/22/2016  . Cerebrovascular disease 07/30/2014  . Sinusitis 06/30/2014  . Encounter for therapeutic drug monitoring 06/09/2014  . Pain in joint of right shoulder 04/21/2014  . Other acne 04/21/2014  . Intertrigo 11/12/2013  . Keloid of skin   . Bilateral leg edema 09/12/2012  . IBS (irritable bowel syndrome) 08/21/2012  . Medicare  annual wellness visit, subsequent 08/17/2012  . Radicular low back pain 06/28/2012  . Basal cell carcinoma 02/07/2012  . Maxillary sinus cyst 02/07/2012  . Statin intolerance 02/28/2011  . Crohn's colitis ([place])   . 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  . CAROTID BRUIT 12/10/2009  . Hyperglycemia 07/02/2009  . HEADACHE 02/24/2009  . ATRIAL FIBRILLATION 01/15/2009  . Obesity 01/03/2008  . OSA (obstructive sleep apnea) 01/03/2008  . Osteoporosis 11/05/2007  . Hyperlipidemia 09/06/2007  . DEPRESSION/ANXIETY 09/06/2007  . Hearing loss 09/06/2007  . Essential hypertension 09/06/2007  . MYOCARDIAL INFARCTION, HX OF 09/06/2007  . Coronary atherosclerosis 09/06/2007  . ALLERGIC RHINITIS 09/06/2007  . COLONIC POLYPS, ADENOMATOUS, HX OF 07/04/2007    Past Surgical History:  Procedure Laterality Date  . BREAST BIOPSY  1996   benign lesion   . Cataract surgery Left 11/13/14  . CHOLECYSTECTOMY  10/2006  . COLONOSCOPY W/ BIOPSIES    . CORONARY ANGIOPLASTY    . ESOPHAGOGASTRODUODENOSCOPY    . NEPHRECTOMY  10/2006   right secondary to oncocytoma   . RIGHT FEMORAL  PSEUDOANEURYSM & RIGHT GROIN HEMATOMA EVACUATION  02/2007  . RIGHT RENAL ARTERY REPAIR  1976  . TUBAL LIGATION       OB History   No obstetric history on file.      Home Medications    Prior  to Admission medications   Medication Sig Start Date End Date Taking? Authorizing Provider  acetaminophen (TYLENOL) 325 MG tablet Take 650 mg by mouth every 6 (six) hours as needed.    [provider]  amLODipine (NORVASC) 10 MG tablet TAKE 1 TABLET BY MOUTH EVERY DAY 04/27/18   Lelon Perla, MD  dofetilide [facility]) 125 MCG capsule TAKE 3 CAPSULES BY MOUTH TWICE A DAY 10/25/18   Lelon Perla, MD  doxazosin (CARDURA) 2 MG tablet TAKE 1 TABLET BY MOUTH EVERY DAY 11/13/18   Lelon Perla, MD  doxycycline (VIBRA-TABS) 100 MG tablet Take 1 tablet (100 mg total) by mouth 2 (two) times daily. 10/30/18    Carollee Herter, Kendrick Fries R, DO  escitalopram (LEXAPRO) 20 MG tablet TAKE 1/2 TABLET BY MOUTH ONCE A DAY 11/12/18   [name], MD  esomeprazole (NEXIUM) 40 MG capsule Take 1 capsule (40 mg total) by mouth daily. Take in the morning before breakfast. 01/15/18   Esterwood, Amy S, PA-C  gabapentin (NEURONTIN) 100 MG capsule Take 1-3 capsules (100-300 mg total) by mouth at bedtime. Patient not taking: Reported on 10/29/2018 09/17/18   [name], MD  hydrocortisone (ANUSOL-HC) 25 MG suppository UNWRAP AND INSERT 1 SUPPOSITORY RECTALLY AT BEDTIME AS NEEDED FOR HEMORRHOIDS OR ITCHING 11/06/17   [name], MD  InFLIXimab (REMICADE IV) Inject into the vein as directed.    [provider]  loperamide (IMODIUM A-D) 2 MG tablet Take 1 mg by mouth daily as needed. Dihrrhea    [provider]  losartan (COZAAR) 100 MG tablet TAKE 1 TABLET (100 MG TOTAL) BY MOUTH DAILY. 07/25/18   Lelon Perla, MD  metoprolol tartrate (LOPRESSOR) 25 MG tablet Take 0.5 tablets (12.5 mg total) by mouth 2 (two) times daily. 08/13/18   Lelon Perla, MD  Multiple Vitamin (MULTIVITAMIN) capsule Take 1 capsule by mouth daily. 1 daily      [provider]  nitroGLYCERIN (NITROSTAT) 0.4 MG SL tablet Place 1 tablet (0.4 mg total) under the tongue every 5 (five) minutes as needed for chest pain. Do not exceed 3 tabs in 15 minutes 08/20/18   Lelon Perla, MD  Probiotic Product ([place]) CAPS Take 1 capsule by mouth daily. 1 per day    [provider]  warfarin (COUMADIN) 2.5 MG tablet TAKE 1/2 to 1 tablet daily as directed by coumadin clinic 09/28/18   Lelon Perla, MD    Family History Family History  Problem Relation Age of Onset  . Colitis Father   . Heart disease Father   . Hypertension Mother   . Coronary artery disease Mother   . Stroke Mother   . Clotting disorder Mother   . Heart disease Mother   . Allergies Mother   . Stroke Brother   . Colon  cancer Paternal Aunt   . Clotting disorder Brother   . Clotting disorder Sister   . Asthma Daughter   . Asthma Maternal Uncle   . Colon polyps Neg Hx   . Kidney disease Neg Hx     Social History Social History   Tobacco Use  . Smoking status: Never Smoker  . Smokeless tobacco: Never Used  Substance Use Topics  . Alcohol use: No  . Drug use: No     Allergies   Hydralazine; Cefdinir; Codeine; Guaifenesin er; Latex; Mucilgen [psyllium]; and Percocet [oxycodone-acetaminophen]   Review of Systems Review of Systems All systems reviewed and negative, other  than as noted in HPI.   Physical Exam Updated Vital Signs BP (!) 171/68 (BP Location: Left Arm)   Pulse (!) 57   Temp 98.7 F (37.1 C) (Oral)   Resp 18   Ht 5\' 3"  (1.6 m)   Wt 110.2 kg   SpO2 97%   BMI 43.05 kg/m   Physical Exam Vitals signs and nursing note reviewed.  Constitutional:      General: She is not in acute distress.    Appearance: She is well-developed.  HENT:     Head: Normocephalic and atraumatic.  Eyes:     General:        Right eye: No discharge.        Left eye: No discharge.     Conjunctiva/sclera: Conjunctivae normal.  Neck:     Musculoskeletal: Neck supple.  Cardiovascular:     Rate and Rhythm: Normal rate and regular rhythm.     Heart sounds: Normal heart sounds. No murmur. No friction rub. No gallop.   Pulmonary:     Effort: Pulmonary effort is normal. No respiratory distress.     Breath sounds: Normal breath sounds.  Abdominal:     General: There is no distension.     Palpations: Abdomen is soft.    Musculoskeletal:        General: No tenderness.  Skin:    General: Skin is warm and dry.     Comments: In the pictured area there is a small lesion about the size of a pencil eraser.  Appears to be a very superficial ulcer or excoriation.  Nontender.  No drainage.  No surrounding cellulitis.  No induration.  Neurological:     Mental Status: She is alert.  Psychiatric:         Behavior: Behavior normal.        Thought Content: Thought content normal.      ED Treatments / Results  Labs (all labs ordered are listed, but only abnormal results are displayed) Labs Reviewed - No data to display  EKG None  Radiology No results found.  Procedures Procedures (including critical care time)  Medications Ordered in ED Medications - No data to display   Initial Impression / Assessment and Plan / ED Course  I have reviewed the triage vital signs and the nursing notes.  Pertinent labs & imaging results that were available during my care of the patient were reviewed by me and considered in my medical decision making (see chart for details).     73 year old female with a small wound to her pubic region.  Question whether this may have been a very small abscess or a spot of folliculitis which is now healing.  The overall appearance is pretty non-concerning.  There is no active drainage.  History of Crohn's/UC but I highly doubt that this is fistulous.  I am not quite sure what the threshold for deferring Remicade would be or not.  Advised to discuss with prescribing provider.  I simply plan local wound care and observation.  Return precautions were discussed.  Final Clinical Impressions(s) / ED Diagnoses   Final diagnoses:  Visit for wound check    ED Discharge Orders    None       Virgel Manifold, MD 11/18/18 1225

## 2018-11-19 DIAGNOSIS — M0589 Other rheumatoid arthritis with rheumatoid factor of multiple sites: Secondary | ICD-10-CM | POA: Diagnosis not present

## 2018-11-30 NOTE — Progress Notes (Addendum)
Subjective:   [name] is a 73 y.o. female who presents for Medicare Annual (Subsequent) preventive examination.  Review of Systems: No ROS.  Medicare Wellness Visit. Additional risk factors are reflected in the social history. Cardiac Risk Factors include: advanced age (>29men, >74 women);dyslipidemia;hypertension;obesity (BMI >30kg/m2);sedentary lifestyle Sleep patterns:   No issues Home Safety/Smoke Alarms: Feels safe in home. Smoke alarms in place.  Lives with husband in 55 story ranch. Walk-in shower with seat and grab rail.   Female:         Mammo-11/2017. States she will schedule.    Dexa scan- 06/05/17       CCS- Pt states she was told she did not need another colonoscopy. Eye- [place] yearly- utd per pt.    Objective:     Vitals: BP 120/60 (BP Location: Left Arm, Patient Position: Sitting, Cuff Size: Large)   Pulse 60   Ht 5\' 3"  (1.6 m)   Wt 247 lb (112 kg)   SpO2 94%   BMI 43.75 kg/m   Body mass index is 43.75 kg/m.  Advanced Directives 12/03/2018 11/18/2018 11/30/2017 12/14/2015 08/28/2014  Does Patient Have a Medical Advance Directive? No No No No No  Would patient like information on creating a medical advance directive? Yes (MAU/Ambulatory/Procedural Areas - Information given) No - Patient declined No - Patient declined No - patient declined information Yes - Scientist, clinical (histocompatibility and immunogenetics) given    Tobacco Social History   Tobacco Use  Smoking Status Never Smoker  Smokeless Tobacco Never Used     Counseling given: Not Answered   Clinical Intake:     Pain : No/denies pain                 Past Medical History:  Diagnosis Date  . Anxiety   . Arthritis   . CAD (coronary artery disease)   . Crohn's colitis ([place])    she reports ulcerative colitis beginning in her 75's, biopsies 2008 suggest Crohn's not UC  . Depression   . GERD (gastroesophageal reflux disease)   . Hyperlipidemia   . Hypertension   . Keloid of skin   . Obesity   . OSA  (obstructive sleep apnea)   . Pancreatitis   . Paroxysmal atrial fibrillation ([facility])   . 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 Renal oncocytoma    s/p right nephrectomy  . Rheumatoid arthritis ([place]) 05/29/2017   Past Surgical History:  Procedure Laterality Date  . BREAST BIOPSY  1996   benign lesion   . Cataract surgery Left 11/13/14  . CHOLECYSTECTOMY  10/2006  . COLONOSCOPY W/ BIOPSIES    . CORONARY ANGIOPLASTY    . ESOPHAGOGASTRODUODENOSCOPY    . NEPHRECTOMY  10/2006   right secondary to oncocytoma   . RIGHT FEMORAL  PSEUDOANEURYSM & RIGHT GROIN HEMATOMA EVACUATION  02/2007  . RIGHT RENAL ARTERY REPAIR  1976  . TUBAL LIGATION     Family History  Problem Relation Age of Onset  . Colitis Father   . Heart disease Father   . Hypertension Mother   . Coronary artery disease Mother   . Stroke Mother   . Clotting disorder Mother   . Heart disease Mother   . Allergies Mother   . Stroke Brother   . Colon cancer Paternal Aunt   . Clotting disorder Brother   . Clotting disorder Sister   . Asthma Daughter   . Asthma Maternal Uncle   . Colon polyps Neg Hx   .  Kidney disease Neg Hx    Social History   Socioeconomic History  . Marital status: Married    Spouse name: Not on file  . Number of children: 3  . Years of education: Not on file  . Highest education level: Not on file  Occupational History  . 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 Financial resource strain: Not on file  . Food insecurity:    Worry: Not on file    Inability: Not on file  . Transportation needs:    Medical: Not on file    Non-medical: Not on file  Tobacco Use  . Smoking status: Never Smoker  . Smokeless tobacco: Never Used  Substance and Sexual Activity  . Alcohol use: No  . Drug use: No  . Sexual activity: Never  Lifestyle  . Physical activity:    Days per week: Not on file    Minutes per session: Not on file  . Stress: Not on file    Relationships  . Social connections:    Talks on phone: Not on file    Gets together: Not on file    Attends religious service: Not on file    Active member of club or organization: Not on file    Attends meetings of clubs or organizations: Not on file    Relationship status: Not on file  Other Topics Concern  . 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 acetaminophen (TYLENOL) 325 MG tablet Take 650 mg by mouth every 6 (six) hours as needed.  Marland Kitchen amLODipine (NORVASC) 10 MG tablet TAKE 1 TABLET BY MOUTH EVERY DAY  . dofetilide (TIKOSYN) 125 MCG capsule TAKE 3 CAPSULES BY MOUTH TWICE A DAY  . doxazosin (CARDURA) 2 MG tablet TAKE 1 TABLET BY MOUTH EVERY DAY  . escitalopram (LEXAPRO) 20 MG tablet TAKE 1/2 TABLET BY MOUTH ONCE A DAY  . esomeprazole (NEXIUM) 40 MG capsule Take 1 capsule (40 mg total) by mouth daily. Take in the morning before breakfast.  . hydrocortisone (ANUSOL-HC) 25 MG suppository UNWRAP AND INSERT 1 SUPPOSITORY RECTALLY AT BEDTIME AS NEEDED FOR HEMORRHOIDS OR ITCHING  . InFLIXimab (REMICADE IV) Inject into the vein as directed.  . loperamide (IMODIUM A-D) 2 MG tablet Take 1 mg by mouth daily as needed. Dihrrhea  . losartan (COZAAR) 100 MG tablet TAKE 1 TABLET (100 MG TOTAL) BY MOUTH DAILY.  . metoprolol tartrate (LOPRESSOR) 25 MG tablet Take 0.5 tablets (12.5 mg total) by mouth 2 (two) times daily.  . Multiple Vitamin (MULTIVITAMIN) capsule Take 1 capsule by mouth daily. 1 daily    . Probiotic Product ([place]) CAPS Take 1 capsule by mouth daily. 1 per day  . warfarin (COUMADIN) 2.5 MG tablet TAKE 1/2 to 1 tablet daily as directed by coumadin clinic  . gabapentin (NEURONTIN) 100 MG capsule Take 1-3 capsules (100-300 mg total) by mouth at bedtime. (Patient  not taking: Reported on 12/03/2018)  . nitroGLYCERIN (NITROSTAT) 0.4 MG SL tablet Place 1 tablet (0.4 mg total) under the tongue every 5 (five) minutes as needed for chest pain. Do not exceed 3 tabs in 15 minutes (Patient not taking: Reported on 12/03/2018)  . [DISCONTINUED] doxycycline (VIBRA-TABS) 100  MG tablet Take 1 tablet (100 mg total) by mouth 2 (two) times daily.   No facility-administered encounter medications on file as of 12/03/2018.     Activities of Daily Living In your present state of health, do you have any difficulty performing the following activities: 12/03/2018  Hearing? Y  Comment wearing hearing aids  Vision? N  Difficulty concentrating or making decisions? N  Walking or climbing stairs? Y  Dressing or bathing? N  Doing errands, shopping? N  Preparing Food and eating ? N  Using the Toilet? N  In the past six months, have you accidently leaked urine? N  Do you have problems with loss of bowel control? N  Managing your Medications? N  Managing your Finances? N  Housekeeping or managing your Housekeeping? Y  Comment has housekeeper once per month  Some recent data might be hidden    Patient Care Team: [name], MD as PCP - General (Family Medicine) Stanford Breed Denice Bors, MD as PCP - Cardiology (Cardiology) Gatha Mayer, MD as Consulting Physician (Gastroenterology) Rigoberto Noel, MD as Consulting Physician (Pulmonary Disease) Calvert Cantor, MD as Consulting Physician (Ophthalmology) Hennie Duos, MD as Consulting Physician (Rheumatology) [place], Jennefer Bravo, MD as Referring Physician (Dermatology)    Assessment:   This is a routine wellness examination for [name]. Physical assessment deferred to PCP.  Exercise Activities and Dietary recommendations Current Exercise Habits: The patient does not participate in regular exercise at present, Exercise limited by: Other - see comments(RA) Diet (meal preparation, eat out, water intake, caffeinated  beverages, dairy products, fruits and vegetables): 24 hour recall Breakfast: oatmeal  Lunch: ham, potatoes, and veg Dinner: soup and grilled cheese  Goals    . Increase physical activity     Walking        Fall Risk Fall Risk  12/03/2018 11/30/2017 05/29/2017 12/14/2015 08/31/2015  Falls in the past year? 1 No No No No  Number falls in past yr: 0 - - - -  Injury with Fall? 0 - - - -     Depression Screen PHQ 2/9 Scores 12/03/2018 11/30/2017 05/29/2017 12/14/2015  PHQ - 2 Score 1 1 0 0  PHQ- 9 Score - - - -     Cognitive Function MMSE - Mini Mental State Exam 12/03/2018 11/30/2017 12/14/2015  Orientation to time 5 5 5   Orientation to Place 5 5 5   Registration 3 3 3   Attention/ Calculation 5 5 5   Recall 3 3 3   Language- name 2 objects 2 2 2   Language- repeat 1 1 1   Language- follow 3 step command 3 3 3   Language- read & follow direction 1 1 1   Write a sentence 1 1 1   Copy design 1 1 1   Total score 30 30 30         Immunization History  Administered Date(s) Administered  . Influenza Split 07/23/2012  . Influenza Whole 09/06/2007, 08/11/2008, 08/24/2009, 08/03/2010  . Influenza, High Dose Seasonal PF 08/02/2013, 06/29/2016, 07/06/2017, 07/31/2018  . Influenza,inj,Quad PF,6+ Mos 06/27/2014, 06/26/2015  . Pneumococcal Conjugate-13 08/28/2014  . Pneumococcal Polysaccharide-23 09/13/2010  . Td 05/12/2009  . Zoster 02/11/2008    Screening Tests Health Maintenance  Topic Date Due  . FOOT EXAM  08/22/1956  . OPHTHALMOLOGY EXAM  08/22/1956  . COLONOSCOPY  09/10/2014  . HEMOGLOBIN A1C  12/01/2018  . TETANUS/TDAP  05/13/2019  . MAMMOGRAM  12/01/2019  . INFLUENZA VACCINE  Completed  . DEXA SCAN  Completed  . Hepatitis C  Screening  Completed  . PNA vac Low Risk Adult  Completed      Plan:    Please schedule your next medicare wellness visit with me in 1 yr.  Continue to eat heart healthy diet (full of fruits, vegetables, whole grains, lean protein, water--limit salt, fat, and  sugar intake) and increase physical activity as tolerated.  Continue doing brain stimulating activities (puzzles, reading, adult coloring books, staying active) to keep memory sharp.   Bring a copy of your living will and/or healthcare power of attorney to your next office visit.  Please schedule mammogram  I have personally reviewed and noted the following in the patient's chart:   . Medical and social history . Use of alcohol, tobacco or illicit drugs  . Current medications and supplements . Functional ability and status . Nutritional status . Physical activity . Advanced directives . List of other physicians . Hospitalizations, surgeries, and ER visits in previous 12 months . Vitals . Screenings to include cognitive, depression, and falls . Referrals and appointments  In addition, I have reviewed and discussed with patient certain preventive protocols, quality metrics, and best practice recommendations. A written personalized care plan for preventive services as well as general preventive health recommendations were provided to patient.     Shela Nevin, [place]  12/03/2018   Medical screening examination/treatment was performed by qualified clinical staff member and as supervising physician I was immediately available for consultation/collaboration. I have reviewed documentation and agree with assessment and plan.  Penni Homans, MD

## 2018-12-03 ENCOUNTER — Encounter: Payer: Self-pay | Admitting: *Deleted

## 2018-12-03 ENCOUNTER — Ambulatory Visit: Payer: Medicare Other | Admitting: *Deleted

## 2018-12-03 ENCOUNTER — Ambulatory Visit (INDEPENDENT_AMBULATORY_CARE_PROVIDER_SITE_OTHER): Payer: Medicare Other | Admitting: Family Medicine

## 2018-12-03 ENCOUNTER — Ambulatory Visit (INDEPENDENT_AMBULATORY_CARE_PROVIDER_SITE_OTHER): Payer: Medicare Other | Admitting: *Deleted

## 2018-12-03 VITALS — BP 120/60 | HR 60 | Ht 63.0 in | Wt 247.0 lb

## 2018-12-03 VITALS — BP 120/60 | HR 60 | Temp 97.6°F | Resp 18 | Wt 247.6 lb

## 2018-12-03 DIAGNOSIS — Z0000 Encounter for general adult medical examination without abnormal findings: Secondary | ICD-10-CM

## 2018-12-03 DIAGNOSIS — I10 Essential (primary) hypertension: Secondary | ICD-10-CM

## 2018-12-03 DIAGNOSIS — M25552 Pain in left hip: Secondary | ICD-10-CM

## 2018-12-03 DIAGNOSIS — I159 Secondary hypertension, unspecified: Secondary | ICD-10-CM

## 2018-12-03 DIAGNOSIS — E7800 Pure hypercholesterolemia, unspecified: Secondary | ICD-10-CM

## 2018-12-03 DIAGNOSIS — R739 Hyperglycemia, unspecified: Secondary | ICD-10-CM | POA: Diagnosis not present

## 2018-12-03 DIAGNOSIS — G8929 Other chronic pain: Secondary | ICD-10-CM

## 2018-12-03 LAB — LIPID PANEL
Cholesterol: 167 mg/dL (ref 0–200)
HDL: 39.5 mg/dL (ref >39.00)
LDL Cholesterol: 95 mg/dL (ref 0–99)
NonHDL: 127.71
TRIGLYCERIDES: 164 mg/dL — AB (ref 0.0–149.0)
Total CHOL/HDL Ratio: 4
VLDL: 32.8 mg/dL (ref 0.0–40.0)

## 2018-12-03 LAB — CBC
HCT: 40.2 % (ref 36.0–46.0)
Hemoglobin: 13.3 g/dL (ref 12.0–15.0)
MCHC: 33 g/dL (ref 30.0–36.0)
MCV: 95.6 fl (ref 78.0–100.0)
PLATELETS: 188 10*3/uL (ref 150.0–400.0)
RBC: 4.21 Mil/uL (ref 3.87–5.11)
RDW: 13.5 % (ref 11.5–15.5)
WBC: 7.6 10*3/uL (ref 4.0–10.5)

## 2018-12-03 LAB — COMPREHENSIVE METABOLIC PANEL
ALBUMIN: 3.6 g/dL (ref 3.5–5.2)
ALT: 16 U/L (ref 0–35)
AST: 18 U/L (ref 0–37)
Alkaline Phosphatase: 63 U/L (ref 39–117)
BUN: 17 mg/dL (ref 6–23)
CALCIUM: 9.1 mg/dL (ref 8.4–10.5)
CO2: 25 mEq/L (ref 19–32)
CREATININE: 1.36 mg/dL — AB (ref 0.40–1.20)
Chloride: 105 mEq/L (ref 96–112)
GFR: 38.08 mL/min — ABNORMAL LOW (ref >60.00)
Glucose, Bld: 90 mg/dL (ref 70–99)
Potassium: 4.4 mEq/L (ref 3.5–5.1)
SODIUM: 139 meq/L (ref 135–145)
Total Bilirubin: 0.4 mg/dL (ref 0.2–1.2)
Total Protein: 6.8 g/dL (ref 6.0–8.3)

## 2018-12-03 LAB — TSH: TSH: 2.85 u[IU]/mL (ref 0.35–4.50)

## 2018-12-03 LAB — HEMOGLOBIN A1C: Hgb A1c MFr Bld: 5.8 % (ref 4.6–6.5)

## 2018-12-03 NOTE — Assessment & Plan Note (Signed)
Well controlled, no changes to meds. Encouraged heart healthy diet such as the DASH diet and exercise as tolerated.  °

## 2018-12-03 NOTE — Patient Instructions (Signed)
Please schedule your next medicare wellness visit with me in 1 yr.  Continue to eat heart healthy diet (full of fruits, vegetables, whole grains, lean protein, water--limit salt, fat, and sugar intake) and increase physical activity as tolerated.  Continue doing brain stimulating activities (puzzles, reading, adult coloring books, staying active) to keep memory sharp.   Bring a copy of your living will and/or healthcare power of attorney to your next office visit.  Please schedule mammogram   [name] , Thank you for taking time to come for your Medicare Wellness Visit. I appreciate your ongoing commitment to your health goals. Please review the following plan we discussed and let me know if I can assist you in the future.   These are the goals we discussed: Goals    . Increase physical activity     Walking        This is a list of the screening recommended for you and due dates:  Health Maintenance  Topic Date Due  . Complete foot exam   08/22/1956  . Eye exam for diabetics  08/22/1956  . Colon Cancer Screening  09/10/2014  . Hemoglobin A1C  12/01/2018  . Tetanus Vaccine  05/13/2019  . Mammogram  12/01/2019  . Flu Shot  Completed  . DEXA scan (bone density measurement)  Completed  .  Hepatitis C: One time screening is recommended by Center for Disease Control  (CDC) for  adults born from [birth year] through 1966.   Completed  . Pneumonia vaccines  Completed    Health Maintenance After Age 47 After age 63, you are at a higher risk for certain long-term diseases and infections as well as injuries from falls. Falls are a major cause of broken bones and head injuries in people who are older than age 68. Getting regular preventive care can help to keep you healthy and well. Preventive care includes getting regular testing and making lifestyle changes as recommended by your health care provider. Talk with your health care provider about:  Which screenings and tests you should  have. A screening is a test that checks for a disease when you have no symptoms.  A diet and exercise plan that is right for you. What should I know about screenings and tests to prevent falls? Screening and testing are the best ways to find a health problem early. Early diagnosis and treatment give you the best chance of managing medical conditions that are common after age 37. Certain conditions and lifestyle choices may make you more likely to have a fall. Your health care provider may recommend:  Regular vision checks. Poor vision and conditions such as cataracts can make you more likely to have a fall. If you wear glasses, make sure to get your prescription updated if your vision changes.  Medicine review. Work with your health care provider to regularly review all of the medicines you are taking, including over-the-counter medicines. Ask your health care provider about any side effects that may make you more likely to have a fall. Tell your health care provider if any medicines that you take make you feel dizzy or sleepy.  Osteoporosis screening. Osteoporosis is a condition that causes the bones to get weaker. This can make the bones weak and cause them to break more easily.  Blood pressure screening. Blood pressure changes and medicines to control blood pressure can make you feel dizzy.  Strength and balance checks. Your health care provider may recommend certain tests to check your strength and  balance while standing, walking, or changing positions.  Foot health exam. Foot pain and numbness, as well as not wearing proper footwear, can make you more likely to have a fall.  Depression screening. You may be more likely to have a fall if you have a fear of falling, feel emotionally low, or feel unable to do activities that you used to do.  Alcohol use screening. Using too much alcohol can affect your balance and may make you more likely to have a fall. What actions can I take to lower my  risk of falls? General instructions  Talk with your health care provider about your risks for falling. Tell your health care provider if: ? You fall. Be sure to tell your health care provider about all falls, even ones that seem minor. ? You feel dizzy, sleepy, or off-balance.  Take over-the-counter and prescription medicines only as told by your health care provider. These include any supplements.  Eat a healthy diet and maintain a healthy weight. A healthy diet includes low-fat dairy products, low-fat (lean) meats, and fiber from whole grains, beans, and lots of fruits and vegetables. Home safety  Remove any tripping hazards, such as rugs, cords, and clutter.  Install safety equipment such as grab bars in bathrooms and safety rails on stairs.  Keep rooms and walkways well-lit. Activity   Follow a regular exercise program to stay fit. This will help you maintain your balance. Ask your health care provider what types of exercise are appropriate for you.  If you need a cane or walker, use it as recommended by your health care provider.  Wear supportive shoes that have nonskid soles. Lifestyle  Do not drink alcohol if your health care provider tells you not to drink.  If you drink alcohol, limit how much you have: ? 0-1 drink a day for women. ? 0-2 drinks a day for men.  Be aware of how much alcohol is in your drink. In the [place] one drink equals one typical bottle of beer (12 oz), one-half glass of wine (5 oz), or one shot of hard liquor (1 oz).  Do not use any products that contain nicotine or tobacco, such as cigarettes and e-cigarettes. If you need help quitting, ask your health care provider. Summary  Having a healthy lifestyle and getting preventive care can help to protect your health and wellness after age 26.  Screening and testing are the best way to find a health problem early and help you avoid having a fall. Early diagnosis and treatment give you the best chance  for managing medical conditions that are more common for people who are older than age 49.  Falls are a major cause of broken bones and head injuries in people who are older than age 104. Take precautions to prevent a fall at home.  Work with your health care provider to learn what changes you can make to improve your health and wellness and to prevent falls. This information is not intended to replace advice given to you by your health care provider. Make sure you discuss any questions you have with your health care provider. Document Released: 08/23/2017 Document Revised: 08/23/2017 Document Reviewed: 08/23/2017 Elsevier Interactive Patient Education  2019 [employer].

## 2018-12-03 NOTE — Assessment & Plan Note (Signed)
hgba1c acceptable, minimize simple carbs. Increase exercise as tolerated.  

## 2018-12-03 NOTE — Progress Notes (Signed)
Subjective:    Patient ID: [name], female    DOB: Nov 24, 1945, 73 y.o.   MRN: [id number]  No chief complaint on file.   HPI Patient is in today for follow-up.  She continues to struggle with pain and notes that her left hip is becoming increasingly painful with radiculopathy into her groin and down her legs.  She does note the 1970s she fell while cleaning her carport and her her left hip on the edge of the leg carport.  She has had pain off and on ever since but the pain is more frequent than it used to be.  No recent febrile illness or hospitalization. Denies CP/palp/SOB/HA/congestion/fevers/GI or GU c/o. Taking meds as prescribed  Past Medical History:  Diagnosis Date  . Anxiety   . Arthritis   . CAD (coronary artery disease)   . Crohn's colitis ([place])    she reports ulcerative colitis beginning in her 55's, biopsies 2008 suggest Crohn's not UC  . Depression   . GERD (gastroesophageal reflux disease)   . Hyperlipidemia   . Hypertension   . Keloid of skin   . Obesity   . OSA (obstructive sleep apnea)   . Pancreatitis   . Paroxysmal atrial fibrillation ([facility])   . 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 Renal oncocytoma    s/p right nephrectomy  . Rheumatoid arthritis ([place]) 05/29/2017    Past Surgical History:  Procedure Laterality Date  . BREAST BIOPSY  1996   benign lesion   . Cataract surgery Left 11/13/14  . CHOLECYSTECTOMY  10/2006  . COLONOSCOPY W/ BIOPSIES    . CORONARY ANGIOPLASTY    . ESOPHAGOGASTRODUODENOSCOPY    . NEPHRECTOMY  10/2006   right secondary to oncocytoma   . RIGHT FEMORAL  PSEUDOANEURYSM & RIGHT GROIN HEMATOMA EVACUATION  02/2007  . RIGHT RENAL ARTERY REPAIR  1976  . TUBAL LIGATION      Family History  Problem Relation Age of Onset  . Colitis Father   . Heart disease Father   . Hypertension Mother   . Coronary artery disease Mother   . Stroke Mother   . Clotting disorder Mother   . Heart disease Mother   . Allergies Mother     . Stroke Brother   . Colon cancer Paternal Aunt   . Clotting disorder Brother   . Clotting disorder Sister   . Asthma Daughter   . Asthma Maternal Uncle   . Colon polyps Neg Hx   . Kidney disease Neg Hx     Social History   Socioeconomic History  . Marital status: Married    Spouse name: Not on file  . Number of children: 3  . Years of education: Not on file  . Highest education level: Not on file  Occupational History  . 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 Financial resource strain: Not on file  . Food insecurity:    Worry: Not on file    Inability: Not on file  . Transportation needs:    Medical: Not on file    Non-medical: Not on file  Tobacco Use  . Smoking status: Never Smoker  . Smokeless tobacco: Never Used  Substance and Sexual Activity  . Alcohol use: No  . Drug use: No  . Sexual activity: Never  Lifestyle  . Physical activity:    Days per week: Not on file    Minutes per session:  Not on file  . Stress: Not on file  Relationships  . Social connections:    Talks on phone: Not on file    Gets together: Not on file    Attends religious service: Not on file    Active member of club or organization: Not on file    Attends meetings of clubs or organizations: Not on file    Relationship status: Not on file  . Intimate partner violence:    Fear of current or ex partner: Not on file    Emotionally abused: Not on file    Physically abused: Not on file    Forced sexual activity: Not on file  Other Topics Concern  . 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 acetaminophen (TYLENOL) 325 MG tablet Take 650 mg by mouth every 6 (six) hours as needed.    Marland Kitchen amLODipine (NORVASC) 10 MG tablet TAKE 1 TABLET BY MOUTH  EVERY DAY 90 tablet 3  . dofetilide (TIKOSYN) 125 MCG capsule TAKE 3 CAPSULES BY MOUTH TWICE A DAY 180 capsule 10  . doxazosin (CARDURA) 2 MG tablet TAKE 1 TABLET BY MOUTH EVERY DAY 90 tablet 2  . doxycycline (VIBRA-TABS) 100 MG tablet Take 1 tablet (100 mg total) by mouth 2 (two) times daily. 20 tablet 0  . escitalopram (LEXAPRO) 20 MG tablet TAKE 1/2 TABLET BY MOUTH ONCE A DAY 45 tablet 1  . esomeprazole (NEXIUM) 40 MG capsule Take 1 capsule (40 mg total) by mouth daily. Take in the morning before breakfast. 30 capsule 11  . gabapentin (NEURONTIN) 100 MG capsule Take 1-3 capsules (100-300 mg total) by mouth at bedtime. 90 capsule 2  . hydrocortisone (ANUSOL-HC) 25 MG suppository UNWRAP AND INSERT 1 SUPPOSITORY RECTALLY AT BEDTIME AS NEEDED FOR HEMORRHOIDS OR ITCHING 12 suppository 1  . InFLIXimab (REMICADE IV) Inject into the vein as directed.    . loperamide (IMODIUM A-D) 2 MG tablet Take 1 mg by mouth daily as needed. Dihrrhea    . losartan (COZAAR) 100 MG tablet TAKE 1 TABLET (100 MG TOTAL) BY MOUTH DAILY. 90 tablet 1  . metoprolol tartrate (LOPRESSOR) 25 MG tablet Take 0.5 tablets (12.5 mg total) by mouth 2 (two) times daily. 90 tablet 3  . Multiple Vitamin (MULTIVITAMIN) capsule Take 1 capsule by mouth daily. 1 daily      . nitroGLYCERIN (NITROSTAT) 0.4 MG SL tablet Place 1 tablet (0.4 mg total) under the tongue every 5 (five) minutes as needed for chest pain. Do not exceed 3 tabs in 15 minutes 75 tablet 3  . Probiotic Product ([place]) CAPS Take 1 capsule by mouth daily. 1 per day    . warfarin (COUMADIN) 2.5 MG tablet TAKE 1/2 to 1 tablet daily as directed by coumadin clinic 90 tablet 1   No facility-administered medications prior to visit.     Allergies  Allergen Reactions  . Hydralazine Diarrhea and Nausea Only    Anorexia, upset stomach, burning pain in abdomin  . Cefdinir     Pt cannot take; may interfere with AFib.  . Codeine     Heart beats fast   .  Guaifenesin Er Nausea And Vomiting and Other (See Comments)    Headaches; can tolerate liquid.  . Latex  Breathing problems  . Mucilgen [Psyllium]   . Percocet [Oxycodone-Acetaminophen]     Itching & swelling in jaw    Review of Systems  Constitutional: Negative for fever and malaise/fatigue.  HENT: Negative for congestion.   Eyes: Negative for blurred vision.  Respiratory: Negative for shortness of breath.   Cardiovascular: Negative for chest pain, palpitations and leg swelling.  Gastrointestinal: Negative for abdominal pain, blood in stool and nausea.  Genitourinary: Negative for dysuria and frequency.  Musculoskeletal: Positive for joint pain. Negative for falls.  Skin: Negative for rash.  Neurological: Negative for dizziness, loss of consciousness and headaches.  Endo/Heme/Allergies: Negative for environmental allergies.  Psychiatric/Behavioral: Negative for depression. The patient is not nervous/anxious.        Objective:    Physical Exam Vitals signs and nursing note reviewed.  Constitutional:      General: She is not in acute distress.    Appearance: She is well-developed.  HENT:     Head: Normocephalic and atraumatic.     Nose: Nose normal.  Eyes:     General:        Right eye: No discharge.        Left eye: No discharge.  Neck:     Musculoskeletal: Normal range of motion and neck supple.  Cardiovascular:     Rate and Rhythm: Normal rate and regular rhythm.     Heart sounds: No murmur.  Pulmonary:     Effort: Pulmonary effort is normal.     Breath sounds: Normal breath sounds.  Abdominal:     General: Bowel sounds are normal.     Palpations: Abdomen is soft.     Tenderness: There is no abdominal tenderness.  Skin:    General: Skin is warm and dry.  Neurological:     Mental Status: She is alert and oriented to person, place, and time.     BP 120/60 (BP Location: Left Arm, Patient Position: Sitting, Cuff Size: Normal)   Pulse 60   Temp 97.6 F  (36.4 C) (Oral)   Resp 18   Wt 247 lb 9.6 oz (112.3 kg)   SpO2 94%   BMI 43.86 kg/m  Wt Readings from Last 3 Encounters:  12/03/18 247 lb 9.6 oz (112.3 kg)  11/18/18 243 lb (110.2 kg)  10/29/18 245 lb 6.4 oz (111.3 kg)     Lab Results  Component Value Date   WBC 7.1 05/31/2018   HGB 13.1 05/31/2018   HCT 39.3 05/31/2018   PLT 190.0 05/31/2018   GLUCOSE 92 05/31/2018   CHOL 172 05/31/2018   TRIG 154.0 (H) 05/31/2018   HDL 45.00 05/31/2018   LDLCALC 96 05/31/2018   ALT 16 05/31/2018   AST 16 05/31/2018   NA 140 05/31/2018   K 4.2 05/31/2018   CL 105 05/31/2018   CREATININE 1.08 05/31/2018   BUN 20 05/31/2018   CO2 28 05/31/2018   TSH 3.00 05/31/2018   INR 2.2 11/09/2018   HGBA1C 5.6 05/31/2018    Lab Results  Component Value Date   TSH 3.00 05/31/2018   Lab Results  Component Value Date   WBC 7.1 05/31/2018   HGB 13.1 05/31/2018   HCT 39.3 05/31/2018   MCV 96.6 05/31/2018   PLT 190.0 05/31/2018   Lab Results  Component Value Date   NA 140 05/31/2018   K 4.2 05/31/2018   CO2 28 05/31/2018   GLUCOSE 92 05/31/2018   BUN 20 05/31/2018   CREATININE 1.08 05/31/2018   BILITOT  0.5 05/31/2018   ALKPHOS 64 05/31/2018   AST 16 05/31/2018   ALT 16 05/31/2018   PROT 7.2 05/31/2018   ALBUMIN 3.8 05/31/2018   CALCIUM 9.2 05/31/2018   ANIONGAP 11 11/13/2015   GFR 52.89 (L) 05/31/2018   Lab Results  Component Value Date   CHOL 172 05/31/2018   Lab Results  Component Value Date   HDL 45.00 05/31/2018   Lab Results  Component Value Date   LDLCALC 96 05/31/2018   Lab Results  Component Value Date   TRIG 154.0 (H) 05/31/2018   Lab Results  Component Value Date   CHOLHDL 4 05/31/2018   Lab Results  Component Value Date   HGBA1C 5.6 05/31/2018       Assessment & Plan:   Problem List Items Addressed This Visit    Hyperlipidemia - Primary   Relevant Orders   Lipid panel   Essential hypertension   Relevant Orders   TSH   Hyperglycemia     hgba1c acceptable, minimize simple carbs. Increase exercise as tolerated.       Relevant Orders   Hemoglobin A1c   Hypertension    Well controlled, no changes to meds. Encouraged heart healthy diet such as the DASH diet and exercise as tolerated.       Relevant Orders   CBC   Comprehensive metabolic panel   TSH   TSH   Hip pain, chronic, left    As a young woman she fell and injured it. Encouraged moist heat and gentle stretching as tolerated. May try NSAIDs and prescription meds as directed and report if symptoms worsen or seek immediate care. Xray ordered      Relevant Orders   DG HIPS BILAT W OR W/O PELVIS 2V      I am having Matea Stanard. Sun maintain her loperamide, multivitamin, [facility], InFLIXimab (REMICADE IV), acetaminophen, hydrocortisone, esomeprazole, amLODipine, losartan, metoprolol tartrate, nitroGLYCERIN, gabapentin, warfarin, dofetilide, doxycycline, escitalopram, and doxazosin.  No orders of the defined types were placed in this encounter.    Penni Homans, MD

## 2018-12-03 NOTE — Assessment & Plan Note (Addendum)
As a young woman she fell and injured it. Encouraged moist heat and gentle stretching as tolerated. May try NSAIDs and prescription meds as directed and report if symptoms worsen or seek immediate care. Xray ordered

## 2018-12-03 NOTE — Patient Instructions (Signed)
Shingrix is the new shingles shot 2 shots over 2-6 months at the pharmacy  Hip Pain  The hip is the joint between the upper legs and the lower pelvis. The bones, cartilage, tendons, and muscles of your hip joint support your body and allow you to move around. Hip pain can range from a minor ache to severe pain in one or both of your hips. The pain may be felt on the inside of the hip joint near the groin, or the outside near the buttocks and upper thigh. You may also have swelling or stiffness. Follow these instructions at home: Managing pain, stiffness, and swelling  If directed, apply ice to the injured area. ? Put ice in a plastic bag. ? Place a towel between your skin and the bag. ? Leave the ice on for 20 minutes, 2-3 times a day  Sleep with a pillow between your legs on your most comfortable side.  Avoid any activities that cause pain. General instructions  Take over-the-counter and prescription medicines only as told by your health care provider.  Do any exercises as told by your health care provider.  Record the following: ? How often you have hip pain. ? The location of your pain. ? What the pain feels like. ? What makes the pain worse.  Keep all follow-up visits as told by your health care provider. This is important. Contact a health care provider if:  You cannot put weight on your leg.  Your pain or swelling continues or gets worse after one week.  It gets harder to walk.  You have a fever. Get help right away if:  You fall.  You have a sudden increase in pain and swelling in your hip.  Your hip is red or swollen or very tender to touch. Summary  Hip pain can range from a minor ache to severe pain in one or both of your hips.  The pain may be felt on the inside of the hip joint near the groin, or the outside near the buttocks and upper thigh.  Avoid any activities that cause pain.  Record how often you have hip pain, the location of the pain, what  makes it worse and what it feels like. This information is not intended to replace advice given to you by your health care provider. Make sure you discuss any questions you have with your health care provider. Document Released: 03/30/2010 Document Revised: 09/12/2016 Document Reviewed: 09/12/2016 Elsevier Interactive Patient Education  2019 [employer].

## 2018-12-17 ENCOUNTER — Other Ambulatory Visit (HOSPITAL_BASED_OUTPATIENT_CLINIC_OR_DEPARTMENT_OTHER): Payer: Self-pay | Admitting: Family Medicine

## 2018-12-17 DIAGNOSIS — Z1231 Encounter for screening mammogram for malignant neoplasm of breast: Secondary | ICD-10-CM

## 2018-12-18 ENCOUNTER — Ambulatory Visit (HOSPITAL_BASED_OUTPATIENT_CLINIC_OR_DEPARTMENT_OTHER)
Admission: RE | Admit: 2018-12-18 | Discharge: 2018-12-18 | Disposition: A | Discharge status: Home / Self Care | Payer: Medicare Other | Source: Ambulatory Visit | Attending: Family Medicine | Admitting: Family Medicine

## 2018-12-18 ENCOUNTER — Encounter (HOSPITAL_BASED_OUTPATIENT_CLINIC_OR_DEPARTMENT_OTHER): Payer: Self-pay

## 2018-12-18 DIAGNOSIS — M25552 Pain in left hip: Secondary | ICD-10-CM | POA: Diagnosis not present

## 2018-12-18 DIAGNOSIS — G8929 Other chronic pain: Secondary | ICD-10-CM | POA: Diagnosis not present

## 2018-12-18 DIAGNOSIS — Z1231 Encounter for screening mammogram for malignant neoplasm of breast: Secondary | ICD-10-CM

## 2018-12-18 DIAGNOSIS — M160 Bilateral primary osteoarthritis of hip: Secondary | ICD-10-CM | POA: Diagnosis not present

## 2018-12-21 ENCOUNTER — Ambulatory Visit (INDEPENDENT_AMBULATORY_CARE_PROVIDER_SITE_OTHER): Payer: Medicare Other | Admitting: *Deleted

## 2018-12-21 DIAGNOSIS — Z5181 Encounter for therapeutic drug level monitoring: Secondary | ICD-10-CM | POA: Diagnosis not present

## 2018-12-21 DIAGNOSIS — I4891 Unspecified atrial fibrillation: Secondary | ICD-10-CM

## 2018-12-21 LAB — POCT INR: INR: 1.9 — AB (ref 2.0–3.0)

## 2018-12-21 NOTE — Patient Instructions (Signed)
Description   Today take 1.5 tablets, then continue 1/2 tablet daily except 1 tablet each Monday, Wednesday and Friday.   Repeat INR in 4 weeks

## 2019-01-02 DIAGNOSIS — M0589 Other rheumatoid arthritis with rheumatoid factor of multiple sites: Secondary | ICD-10-CM | POA: Diagnosis not present

## 2019-01-21 ENCOUNTER — Other Ambulatory Visit: Payer: Self-pay | Admitting: Cardiology

## 2019-01-31 ENCOUNTER — Other Ambulatory Visit: Payer: Self-pay | Admitting: Cardiology

## 2019-01-31 ENCOUNTER — Telehealth: Payer: Self-pay | Admitting: Cardiology

## 2019-01-31 NOTE — Telephone Encounter (Signed)
Losartan refilled 

## 2019-01-31 NOTE — Telephone Encounter (Signed)
New Message    *STAT* If patient is at the pharmacy, call can be transferred to refill team.   1. Which medications need to be refilled? (please list name of each medication and dose if known) Losartan 100mg   2. Which pharmacy/location (including street and city if local pharmacy) is medication to be sent to? [facility] [place]   3. Do they need a 30 day or 90 day supply? 90 day   Pt is almost out of medication

## 2019-02-01 NOTE — Telephone Encounter (Signed)
Please make sure pt is taking only one ARB; if has cozaar, DC previous order for avapro [facility]

## 2019-02-01 NOTE — Telephone Encounter (Signed)
Avapro 150 mg daily Kirk Ruths'

## 2019-02-04 MED ORDER — IRBESARTAN 150 MG PO TABS: 150.0000 mg | ORAL_TABLET | Freq: Every day | ORAL | 3 refills | Status: DC

## 2019-02-04 NOTE — Telephone Encounter (Signed)
Spoke with pt, aware not to take the irbesartan as she is taking the losartan. Spoke with pt pharmacy, aware to disregard the irbesartan script.

## 2019-02-13 DIAGNOSIS — M0589 Other rheumatoid arthritis with rheumatoid factor of multiple sites: Secondary | ICD-10-CM | POA: Diagnosis not present

## 2019-02-22 ENCOUNTER — Telehealth: Payer: Self-pay | Admitting: Cardiology

## 2019-02-22 NOTE — Telephone Encounter (Signed)
Patient states she can feel that her heart is out of rhythm, denies, chest pains, SOB or dizziness, has had several episodes previously- continued to take Tikosyn as prescribed. Patient states she normally goes back into rhythm by herself but she has been out of rhythm since around 9:00 am this morning. She is unable to check a HR or BP at this time- she just has a feeling. I advised patient I would send to PA for her recommendations on how to proceed.

## 2019-02-22 NOTE — Telephone Encounter (Signed)
Last INR was subtherapeutic.  Would continue present medications for now.  Hopefully she will convert on her own.  If not we can check INR and once therapeutic for 3 consecutive weeks proceed with cardioversion.  If she cannot tolerate atrial fibrillation from a symptomatic standpoint would need TEE cardioversion once INR therapeutic. Kirk Ruths

## 2019-02-22 NOTE — Telephone Encounter (Signed)
Message received from Triage. I returned patient call.  She states she is in Afib. Her heart rate is in the 60s and her pressure is 127/111. She denies dizziness, lightheadedness, and feelings of pre-syncope. No chest pain or shortness of breath. She does report feeling palpitations and is concerned because she normally spontaneously converts and hasn't yet.   I talked to her about going to [facility] for DCCV in the ER. However, she is not hypotensive or tachycardic.  I do not see an INR in the past three weeks, making DCCV without TEE questionable. She is asking if she should go to [facility] for an EKG. I explained that I don't want to expose her to two different ER's if [facility] can't do a DCCV and sends her to [facility]. I do not want to suggest extra BB dose as her HR is already in the 60s. She has not missed any doses of tikosyn.  I will send this message to [name] for further advisement.

## 2019-02-22 NOTE — Telephone Encounter (Signed)
Message received from [name]. I returned call to patient. She will call and setup an INR appt on Monday.

## 2019-02-22 NOTE — Telephone Encounter (Signed)
New message:    Patient calling stating she has been in AFIB lall dayand would like for someone to call her back.

## 2019-02-25 ENCOUNTER — Ambulatory Visit (INDEPENDENT_AMBULATORY_CARE_PROVIDER_SITE_OTHER): Payer: Medicare Other | Admitting: *Deleted

## 2019-02-25 ENCOUNTER — Other Ambulatory Visit: Payer: Self-pay

## 2019-02-25 DIAGNOSIS — Z5181 Encounter for therapeutic drug level monitoring: Secondary | ICD-10-CM | POA: Diagnosis not present

## 2019-02-25 DIAGNOSIS — I4891 Unspecified atrial fibrillation: Secondary | ICD-10-CM

## 2019-02-25 LAB — POCT INR: INR: 3.1 — AB (ref 2.0–3.0)

## 2019-02-25 NOTE — Patient Instructions (Addendum)
Description   Today take 1/2 tablet then continue 1/2 tablet daily except 1 tablet each Monday, Wednesday and Friday.   Repeat INR in 1 week (pending DCCV/TEE).

## 2019-02-26 ENCOUNTER — Telehealth: Payer: Self-pay | Admitting: Cardiology

## 2019-02-26 ENCOUNTER — Telehealth: Payer: Self-pay

## 2019-02-26 NOTE — Telephone Encounter (Signed)
New message   Patient states that she will not be needing a cardioversion her heart is now in rhythm. Please call to discuss.

## 2019-02-26 NOTE — Telephone Encounter (Signed)
Returned a call to the pt and pt states she has been back in rhythm since she saw [place] in the [facility] clinic. She states she will not be having a DCCV anytime soon as she is fine and in rhythm. Advised her to update to update [name] and she stated she would. Also, I transferred her to the main line to have her leave a message for [name] and RN. Pt advised to call back with any issues.

## 2019-02-26 NOTE — Telephone Encounter (Signed)
Pt stated that their heart went back into rhythm and no longer needs weekly appts. Pt requests a call back for confirmation will route to pharmd to address

## 2019-02-26 NOTE — Telephone Encounter (Signed)
Spoke with pt, she is back in rhythm and will no longer need cardioversion. She is not sure one is scheduled will call the hospital and cancel.

## 2019-03-06 DIAGNOSIS — Z85828 Personal history of other malignant neoplasm of skin: Secondary | ICD-10-CM | POA: Diagnosis not present

## 2019-03-06 DIAGNOSIS — H209 Unspecified iridocyclitis: Secondary | ICD-10-CM | POA: Diagnosis not present

## 2019-03-06 DIAGNOSIS — M545 Low back pain: Secondary | ICD-10-CM | POA: Diagnosis not present

## 2019-03-06 DIAGNOSIS — M65322 Trigger finger, left index finger: Secondary | ICD-10-CM | POA: Diagnosis not present

## 2019-03-06 DIAGNOSIS — Q600 Renal agenesis, unilateral: Secondary | ICD-10-CM | POA: Diagnosis not present

## 2019-03-06 DIAGNOSIS — M0589 Other rheumatoid arthritis with rheumatoid factor of multiple sites: Secondary | ICD-10-CM | POA: Diagnosis not present

## 2019-03-06 DIAGNOSIS — K5090 Crohn's disease, unspecified, without complications: Secondary | ICD-10-CM | POA: Diagnosis not present

## 2019-03-06 DIAGNOSIS — Z79899 Other long term (current) drug therapy: Secondary | ICD-10-CM | POA: Diagnosis not present

## 2019-03-19 ENCOUNTER — Telehealth: Payer: Self-pay

## 2019-03-19 NOTE — Telephone Encounter (Signed)
lmom for prescreen  

## 2019-03-19 NOTE — Telephone Encounter (Signed)

## 2019-03-20 ENCOUNTER — Other Ambulatory Visit: Payer: Self-pay

## 2019-03-20 ENCOUNTER — Ambulatory Visit (INDEPENDENT_AMBULATORY_CARE_PROVIDER_SITE_OTHER): Payer: Medicare Other | Admitting: *Deleted

## 2019-03-20 DIAGNOSIS — I4891 Unspecified atrial fibrillation: Secondary | ICD-10-CM | POA: Diagnosis not present

## 2019-03-20 DIAGNOSIS — Z5181 Encounter for therapeutic drug level monitoring: Secondary | ICD-10-CM

## 2019-03-20 LAB — POCT INR: INR: 2.9 (ref 2.0–3.0)

## 2019-03-20 NOTE — Patient Instructions (Addendum)
Description   Spoke with pt and instructed to continue taking 1/2 tablet daily except 1 tablet each Monday, Wednesday and Friday.  Repeat INR in 4 weeks.

## 2019-03-27 DIAGNOSIS — M0589 Other rheumatoid arthritis with rheumatoid factor of multiple sites: Secondary | ICD-10-CM | POA: Diagnosis not present

## 2019-03-27 DIAGNOSIS — K5090 Crohn's disease, unspecified, without complications: Secondary | ICD-10-CM | POA: Diagnosis not present

## 2019-03-29 ENCOUNTER — Other Ambulatory Visit: Payer: Self-pay | Admitting: Cardiology

## 2019-04-11 ENCOUNTER — Telehealth: Payer: Self-pay

## 2019-04-11 NOTE — Telephone Encounter (Signed)
lmom for prescreen  

## 2019-04-15 NOTE — Telephone Encounter (Signed)
1. COVID-19 Pre-Screening Questions:  . In the past 7 to 10 days have you had a cough,  shortness of breath, headache, congestion, fever (100 or greater) body aches, chills, sore throat, or sudden loss of taste or sense of smell?  Had respiratory bug 6/13, treated with Zicam, feeling fine since . Have you been around anyone with known Covid 19.  no . Have you been around anyone who is awaiting Covid 19 test results in the past 7 to 10 days?  no . Have you been around anyone who has been exposed to Covid 19, or has mentioned symptoms of Covid 19 within the past 7 to 10 days?  no    2. Pt advised of visitor restrictions (no visitors allowed except if needed to conduct the visit). Also advised to arrive at appointment time and wear a mask.

## 2019-04-18 ENCOUNTER — Other Ambulatory Visit: Payer: Self-pay

## 2019-04-18 ENCOUNTER — Ambulatory Visit (INDEPENDENT_AMBULATORY_CARE_PROVIDER_SITE_OTHER): Payer: Medicare Other | Admitting: Pharmacist

## 2019-04-18 DIAGNOSIS — Z5181 Encounter for therapeutic drug level monitoring: Secondary | ICD-10-CM

## 2019-04-18 DIAGNOSIS — I4891 Unspecified atrial fibrillation: Secondary | ICD-10-CM

## 2019-04-18 LAB — POCT INR: INR: 3.4 — AB (ref 2.0–3.0)

## 2019-04-20 ENCOUNTER — Other Ambulatory Visit: Payer: Self-pay | Admitting: Cardiology

## 2019-04-20 DIAGNOSIS — I480 Paroxysmal atrial fibrillation: Secondary | ICD-10-CM

## 2019-04-25 ENCOUNTER — Other Ambulatory Visit: Payer: Self-pay | Admitting: Cardiology

## 2019-05-01 DIAGNOSIS — L820 Inflamed seborrheic keratosis: Secondary | ICD-10-CM | POA: Diagnosis not present

## 2019-05-01 DIAGNOSIS — L409 Psoriasis, unspecified: Secondary | ICD-10-CM | POA: Diagnosis not present

## 2019-05-03 ENCOUNTER — Other Ambulatory Visit: Payer: Self-pay | Admitting: Family Medicine

## 2019-05-08 ENCOUNTER — Ambulatory Visit: Payer: Medicare Other | Admitting: Adult Health

## 2019-05-08 DIAGNOSIS — M0589 Other rheumatoid arthritis with rheumatoid factor of multiple sites: Secondary | ICD-10-CM | POA: Diagnosis not present

## 2019-05-10 ENCOUNTER — Encounter: Payer: Self-pay | Admitting: Adult Health

## 2019-05-10 ENCOUNTER — Other Ambulatory Visit: Payer: Self-pay

## 2019-05-10 ENCOUNTER — Ambulatory Visit (INDEPENDENT_AMBULATORY_CARE_PROVIDER_SITE_OTHER): Payer: Medicare Other | Admitting: Adult Health

## 2019-05-10 DIAGNOSIS — G4733 Obstructive sleep apnea (adult) (pediatric): Secondary | ICD-10-CM | POA: Diagnosis not present

## 2019-05-10 NOTE — Patient Instructions (Addendum)
Continue on CPAP At bedtime   CPAP Download  Keep up good work  Do not drive if sleepy.  Work on healthy weight  Follow up with Dr. Elsworth Soho or Parrett NP   In 1 year and As needed   Please contact office for sooner follow up if symptoms do not improve or worsen or seek emergency care

## 2019-05-10 NOTE — Progress Notes (Signed)
Virtual Visit via Telephone Note  I connected with [name] on 05/10/19 at  2:30 PM EDT by telephone and verified that I am speaking with the correct person using two identifiers.  Location: Patient: Home  Provider: Office    I discussed the limitations, risks, security and privacy concerns of performing an evaluation and management service by telephone and the availability of in person appointments. I also discussed with the patient that there may be a patient responsible charge related to this service. The patient expressed understanding and agreed to proceed.   History of Present Illness: 73 year old female followed for severe sleep apnea on nocturnal CPAP Medical history significant for rheumatoid arthritis on Remicade followed with rheumatology Dr. Amil Amen And atrial fibrillation on Coumadin therapy  Today's tele-visit is a one-year follow-up for obstructive sleep apnea.  Patient is on nocturnal CPAP.  Patient says she is doing very well on CPAP.  She says she wears it every single night gets in about 7 to 8 hours. She feels rested with no significant daytime sleepiness and feels that she benefits from CPAP. CPAP download was requested.  She says she can not live without it . Wears it every night , never misses a night .     Observations/Objective: PSG 2008 >. AHI 36/h Auto 2012 - optimal pr 12 cm , FF mask   Assessment and Plan: Obstructive sleep apnea with excellent compliance   Morbid obesity-healthy weight loss discussed  Plan  . Patient Instructions  Continue on CPAP At bedtime   CPAP Download  Keep up good work  Do not drive if sleepy.  Work on healthy weight  Follow up with Dr. Elsworth Soho or Parrett NP   In 1 year and As needed   Please contact office for sooner follow up if symptoms do not improve or worsen or seek emergency care       Follow Up Instructions: Follow-up with Dr. Elsworth Soho in 1 year and as needed   I discussed the assessment and treatment  plan with the patient. The patient was provided an opportunity to ask questions and all were answered. The patient agreed with the plan and demonstrated an understanding of the instructions.   The patient was advised to call back or seek an in-person evaluation if the symptoms worsen or if the condition fails to improve as anticipated.  I provided 21 minutes of non-face-to-face time during this encounter.   Rexene Edison, NP

## 2019-05-14 ENCOUNTER — Telehealth: Payer: Self-pay

## 2019-05-14 NOTE — Telephone Encounter (Signed)

## 2019-05-16 ENCOUNTER — Other Ambulatory Visit: Payer: Self-pay

## 2019-05-16 ENCOUNTER — Ambulatory Visit (INDEPENDENT_AMBULATORY_CARE_PROVIDER_SITE_OTHER): Payer: Medicare Other | Admitting: Pharmacist

## 2019-05-16 DIAGNOSIS — I4891 Unspecified atrial fibrillation: Secondary | ICD-10-CM | POA: Diagnosis not present

## 2019-05-16 DIAGNOSIS — Z5181 Encounter for therapeutic drug level monitoring: Secondary | ICD-10-CM | POA: Diagnosis not present

## 2019-05-16 LAB — POCT INR: INR: 2.7 (ref 2.0–3.0)

## 2019-05-30 ENCOUNTER — Other Ambulatory Visit: Payer: Self-pay | Admitting: Cardiology

## 2019-05-30 ENCOUNTER — Telehealth: Payer: Self-pay | Admitting: *Deleted

## 2019-05-30 MED ORDER — LOSARTAN POTASSIUM 100 MG PO TABS: 100.0000 mg | ORAL_TABLET | Freq: Every day | ORAL | 3 refills | Status: DC

## 2019-05-30 NOTE — Telephone Encounter (Signed)
Refill sent to the pharmacy electronically.  

## 2019-05-30 NOTE — Telephone Encounter (Signed)
-----   Message from Lelon Perla, MD sent at 05/28/2019  5:11 PM EDT ----- Regarding: FW: Epic error  ----- Message ----- From: Willy Eddy, RN Sent: 05/28/2019   4:02 PM EDT To: Lelon Perla, MD Subject: Epic error                                     Good afternoon [name],   Do to an issue with Epic the refill order did not go through or was auto cancelled.  We are working with Epic and hope to have this issue resolved by 06/13/19.  We apologize for any inconvenience this may cause.  Please place a new order for the medication.  04/25/2019 LOSARTAN POTASSIUM 100 MG PO TABS   Thank you,  Zebedee Iba, RN, BSN Ambulatory Analyst

## 2019-05-31 ENCOUNTER — Other Ambulatory Visit: Payer: Self-pay | Admitting: Cardiology

## 2019-05-31 NOTE — Telephone Encounter (Signed)
diovan should be 160 mg daily Kirk Ruths

## 2019-06-11 ENCOUNTER — Other Ambulatory Visit: Payer: Self-pay

## 2019-06-11 ENCOUNTER — Ambulatory Visit (INDEPENDENT_AMBULATORY_CARE_PROVIDER_SITE_OTHER): Payer: Medicare Other | Admitting: *Deleted

## 2019-06-11 DIAGNOSIS — I4891 Unspecified atrial fibrillation: Secondary | ICD-10-CM | POA: Diagnosis not present

## 2019-06-11 DIAGNOSIS — Z5181 Encounter for therapeutic drug level monitoring: Secondary | ICD-10-CM

## 2019-06-11 LAB — POCT INR: INR: 3.4 — AB (ref 2.0–3.0)

## 2019-06-11 NOTE — Patient Instructions (Signed)
Description   Skip today's dose, then Continue taking 1/2 tablet daily except 1 tablet each Monday, Wednesday and Friday.  Repeat INR in 3 weeks.

## 2019-06-19 DIAGNOSIS — M0589 Other rheumatoid arthritis with rheumatoid factor of multiple sites: Secondary | ICD-10-CM | POA: Diagnosis not present

## 2019-06-23 ENCOUNTER — Other Ambulatory Visit: Payer: Self-pay | Admitting: Cardiology

## 2019-06-24 NOTE — Telephone Encounter (Signed)
Please review for refill. Thank you! 

## 2019-07-02 ENCOUNTER — Ambulatory Visit (INDEPENDENT_AMBULATORY_CARE_PROVIDER_SITE_OTHER): Payer: Medicare Other | Admitting: *Deleted

## 2019-07-02 ENCOUNTER — Other Ambulatory Visit: Payer: Self-pay

## 2019-07-02 DIAGNOSIS — Z5181 Encounter for therapeutic drug level monitoring: Secondary | ICD-10-CM | POA: Diagnosis not present

## 2019-07-02 DIAGNOSIS — I4891 Unspecified atrial fibrillation: Secondary | ICD-10-CM

## 2019-07-02 LAB — POCT INR: INR: 2.7 (ref 2.0–3.0)

## 2019-07-02 NOTE — Patient Instructions (Signed)
Description   Continue taking 1/2 tablet daily except 1 tablet each Monday, Wednesday and Friday.  Repeat INR in 4 weeks.

## 2019-07-08 DIAGNOSIS — H209 Unspecified iridocyclitis: Secondary | ICD-10-CM | POA: Diagnosis not present

## 2019-07-08 DIAGNOSIS — M545 Low back pain: Secondary | ICD-10-CM | POA: Diagnosis not present

## 2019-07-08 DIAGNOSIS — L401 Generalized pustular psoriasis: Secondary | ICD-10-CM | POA: Diagnosis not present

## 2019-07-08 DIAGNOSIS — K5090 Crohn's disease, unspecified, without complications: Secondary | ICD-10-CM | POA: Diagnosis not present

## 2019-07-08 DIAGNOSIS — M65322 Trigger finger, left index finger: Secondary | ICD-10-CM | POA: Diagnosis not present

## 2019-07-08 DIAGNOSIS — Z85828 Personal history of other malignant neoplasm of skin: Secondary | ICD-10-CM | POA: Diagnosis not present

## 2019-07-08 DIAGNOSIS — Z79899 Other long term (current) drug therapy: Secondary | ICD-10-CM | POA: Diagnosis not present

## 2019-07-08 DIAGNOSIS — M0589 Other rheumatoid arthritis with rheumatoid factor of multiple sites: Secondary | ICD-10-CM | POA: Diagnosis not present

## 2019-07-08 DIAGNOSIS — Q600 Renal agenesis, unilateral: Secondary | ICD-10-CM | POA: Diagnosis not present

## 2019-07-17 ENCOUNTER — Other Ambulatory Visit: Payer: Self-pay | Admitting: Cardiology

## 2019-07-18 ENCOUNTER — Other Ambulatory Visit: Payer: Self-pay | Admitting: Cardiology

## 2019-07-30 ENCOUNTER — Ambulatory Visit (INDEPENDENT_AMBULATORY_CARE_PROVIDER_SITE_OTHER): Payer: Medicare Other | Admitting: Cardiology

## 2019-07-30 ENCOUNTER — Telehealth: Payer: Self-pay | Admitting: Adult Health

## 2019-07-30 ENCOUNTER — Other Ambulatory Visit: Payer: Self-pay

## 2019-07-30 DIAGNOSIS — I4891 Unspecified atrial fibrillation: Secondary | ICD-10-CM

## 2019-07-30 DIAGNOSIS — Z5181 Encounter for therapeutic drug level monitoring: Secondary | ICD-10-CM

## 2019-07-30 DIAGNOSIS — G4733 Obstructive sleep apnea (adult) (pediatric): Secondary | ICD-10-CM

## 2019-07-30 LAB — POCT INR: INR: 3 (ref 2.0–3.0)

## 2019-07-30 NOTE — Telephone Encounter (Signed)
LMTCB x1 for pt.  

## 2019-07-30 NOTE — Patient Instructions (Signed)
Continue taking 1/2 tablet daily except 1 tablet each Monday, Wednesday and Friday.  Repeat INR in 4 weeks.

## 2019-07-31 DIAGNOSIS — M0589 Other rheumatoid arthritis with rheumatoid factor of multiple sites: Secondary | ICD-10-CM | POA: Diagnosis not present

## 2019-07-31 DIAGNOSIS — K5090 Crohn's disease, unspecified, without complications: Secondary | ICD-10-CM | POA: Diagnosis not present

## 2019-08-01 NOTE — Telephone Encounter (Signed)
Spoke with pt, she states her machine is  Giving a message that the motor has ran its life cycle and needs a new order for a new CPAP machine. TP is it ok to write a new order for CPAP to [place]? Please advise.

## 2019-08-02 NOTE — Telephone Encounter (Signed)
Yes that is fine thanks  Order for new CPAP with Same pressure setting

## 2019-08-02 NOTE — Telephone Encounter (Signed)
Spoke with patient. She is aware that TP has approved the order for the new CPAP machine. Advised her that I will place the order today, she verbalized understanding.

## 2019-08-12 ENCOUNTER — Other Ambulatory Visit: Payer: Self-pay

## 2019-08-12 MED ORDER — DOXAZOSIN MESYLATE 2 MG PO TABS: 2.0000 mg | ORAL_TABLET | Freq: Every day | ORAL | 2 refills | Status: DC

## 2019-08-19 ENCOUNTER — Encounter: Payer: Self-pay | Admitting: Family Medicine

## 2019-08-27 ENCOUNTER — Ambulatory Visit (INDEPENDENT_AMBULATORY_CARE_PROVIDER_SITE_OTHER): Payer: Medicare Other | Admitting: *Deleted

## 2019-08-27 ENCOUNTER — Other Ambulatory Visit: Payer: Self-pay

## 2019-08-27 DIAGNOSIS — I4891 Unspecified atrial fibrillation: Secondary | ICD-10-CM | POA: Diagnosis not present

## 2019-08-27 DIAGNOSIS — Z5181 Encounter for therapeutic drug level monitoring: Secondary | ICD-10-CM

## 2019-08-27 LAB — POCT INR: INR: 3.9 — AB (ref 2.0–3.0)

## 2019-08-27 NOTE — Patient Instructions (Signed)
Hold warfarin tonight then decrease dose to 1/2 tablet daily except 1 tablet each Monday and Friday.  Repeat INR in 3 weeks.

## 2019-09-02 NOTE — Progress Notes (Signed)
HPI: FU coronary artery disease and atrial fibrillation. Last cardiac catheterization on Mar 06, 2007 showed an ejection fraction of 55%. There is nonobstructive plaque in the LAD, and circumflex and the right coronary artery was normal. 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 Also note the patient is extremely sensitive to Coumadin. MRA of the abdomen in September of 2012 showed no left renal artery stenosis and previous right nephrectomy. 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 Echocardiogram in June of 2014 showed normal LV function, grade 1 diastolic dysfunction and mild left atrial enlargement. Carotid Dopplers in March 2017 showed 1-39% stenosis bilaterally.Event monitor January 2018 showed sinus with PACs, PVCs and ectopic atrial tachycardia.Since I last saw her, she denies increased dyspnea, exertional chest pain or syncope.  No bleeding.  She has had one episode of atrial fibrillation since last seen which converted spontaneously.  Current Outpatient Medications  Medication Sig Dispense Refill  . acetaminophen (TYLENOL) 325 MG tablet Take 650 mg by mouth every 6 (six) hours as needed.    Marland Kitchen amLODipine (NORVASC) 10 MG tablet TAKE 1 TABLET BY MOUTH EVERY DAY 90 tablet 1  . dofetilide (TIKOSYN) 125 MCG capsule TAKE 3 CAPSULES BY MOUTH TWICE A DAY 540 capsule 3  . doxazosin (CARDURA) 2 MG tablet Take 1 tablet (2 mg total) by mouth daily. 90 tablet 2  . escitalopram (LEXAPRO) 20 MG tablet TAKE 1/2 TABLET BY MOUTH EVERY DAY 45 tablet 1  . esomeprazole (NEXIUM) 40 MG capsule Take 1 capsule (40 mg total) by mouth daily. Take in the morning before breakfast. 30 capsule 11  . hydrocortisone (ANUSOL-HC) 25 MG suppository UNWRAP AND INSERT 1 SUPPOSITORY RECTALLY AT BEDTIME AS NEEDED FOR HEMORRHOIDS OR ITCHING 12 suppository 1  . InFLIXimab (REMICADE IV) Inject into the vein  as directed.    . loperamide (IMODIUM A-D) 2 MG tablet Take 1 mg by mouth daily as needed. Dihrrhea    . metoprolol tartrate (LOPRESSOR) 25 MG tablet TAKE 1 TABLET BY MOUTH TWICE A DAY (Patient taking differently: 12.5 mg. ) 180 tablet 1  . Multiple Vitamin (MULTIVITAMIN) capsule Take 1 capsule by mouth daily. 1 daily      . nitroGLYCERIN (NITROSTAT) 0.4 MG SL tablet Place 1 tablet (0.4 mg total) under the tongue every 5 (five) minutes as needed for chest pain. Do not exceed 3 tabs in 15 minutes 75 tablet 3  . Probiotic Product ([place]) CAPS Take 1 capsule by mouth daily. 1 per day    . valsartan (DIOVAN) 160 MG tablet Take 1 tablet (160 mg total) by mouth daily. 90 tablet 3  . warfarin (COUMADIN) 2.5 MG tablet TAKE 1/2 TO 1 TABLET DAILY AS DIRECTED BY COUMADIN CLINIC 90 tablet 0   No current facility-administered medications for this visit.      Past Medical History:  Diagnosis Date  . Anxiety   . Arthritis   . CAD (coronary artery disease)   . Crohn's colitis ([place])    she reports ulcerative colitis beginning in her 65's, biopsies 2008 suggest Crohn's not UC  . Depression   . GERD (gastroesophageal reflux disease)   . Hyperlipidemia   . Hypertension   . Keloid of skin   . Obesity   . OSA (obstructive sleep apnea)   . Pancreatitis   . Paroxysmal atrial fibrillation ([facility])   . 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 Renal oncocytoma    s/p right nephrectomy  . Rheumatoid arthritis ([place]) 05/29/2017    Past Surgical History:  Procedure Laterality Date  . BREAST BIOPSY  1996   benign lesion   . Cataract surgery Left 11/13/14  . CHOLECYSTECTOMY  10/2006  . COLONOSCOPY W/ BIOPSIES    . CORONARY ANGIOPLASTY    . ESOPHAGOGASTRODUODENOSCOPY    . NEPHRECTOMY  10/2006   right secondary to oncocytoma   . RIGHT FEMORAL  PSEUDOANEURYSM & RIGHT GROIN HEMATOMA EVACUATION  02/2007  . RIGHT RENAL ARTERY REPAIR  1976  . TUBAL LIGATION      Social History    Socioeconomic History  . Marital status: Married    Spouse name: Not on file  . Number of children: 3  . Years of education: Not on file  . Highest education level: Not on file  Occupational History  . 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 Financial resource strain: Not on file  . Food insecurity    Worry: Not on file    Inability: Not on file  . Transportation needs    Medical: Not on file    Non-medical: Not on file  Tobacco Use  . Smoking status: Never Smoker  . Smokeless tobacco: Never Used  Substance and Sexual Activity  . Alcohol use: No  . Drug use: No  . Sexual activity: Never  Lifestyle  . Physical activity    Days per week: Not on file    Minutes per session: Not on file  . Stress: Not on file  Relationships  . Social Herbalist on phone: Not on file    Gets together: Not on file    Attends religious service: Not on file    Active member of club or organization: Not on file    Attends meetings of clubs or organizations: Not on file    Relationship status: Not on file  . Intimate partner violence    Fear of current or ex partner: Not on file    Emotionally abused: Not on file    Physically abused: Not on file    Forced sexual activity: Not on file  Other Topics Concern  . 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 Colitis Father   . Heart disease Father   . Hypertension Mother   . Coronary artery disease Mother   . Stroke Mother   . Clotting disorder Mother   . Heart disease Mother   . Allergies Mother   . Stroke Brother   . Colon cancer Paternal Aunt   . Clotting disorder Brother   . Clotting disorder Sister   . Asthma Daughter   . Asthma Maternal Uncle   . Colon polyps Neg Hx   . Kidney disease Neg Hx      ROS: Arthralgias but no fevers or chills, productive cough, hemoptysis, dysphasia, odynophagia, melena, hematochezia, dysuria, hematuria, rash, seizure activity, orthopnea, PND, pedal edema, claudication. Remaining systems are negative.  Physical Exam: Well-developed obese in no acute distress.  Skin is warm and dry.  HEENT is normal.  Neck is supple.  Chest is clear to auscultation with normal  expansion.  Cardiovascular exam is regular rate and rhythm.  Abdominal exam nontender or distended. No masses palpated. Extremities show no edema. neuro grossly intact  ECG-sinus bradycardia at a rate of 56, no ST changes, normal QT interval.  Personally reviewed  A/P  1 paroxysmal atrial fibrillation-patient remains in sinus rhythm today.  Continue Tikosyn at present dose.  Continue Coumadin.  Check potassium, magnesium, renal function and hemoglobin.  2 hypertension-blood pressure controlled.  Continue present medications and follow.  3 hyperlipidemia-intolerant to statins.  She declined other lipid-lowering medications previously.  Continue diet.    4 coronary artery disease-patient is intolerant to statins.  She is not on aspirin given need for long-term anticoagulation.  5 carotid artery disease-mild on most recent Dopplers.  Kirk Ruths, MD

## 2019-09-09 ENCOUNTER — Encounter: Payer: Self-pay | Admitting: Cardiology

## 2019-09-09 ENCOUNTER — Ambulatory Visit (INDEPENDENT_AMBULATORY_CARE_PROVIDER_SITE_OTHER): Payer: Medicare Other | Admitting: Pharmacist

## 2019-09-09 ENCOUNTER — Other Ambulatory Visit: Payer: Self-pay

## 2019-09-09 ENCOUNTER — Ambulatory Visit (INDEPENDENT_AMBULATORY_CARE_PROVIDER_SITE_OTHER): Payer: Medicare Other | Admitting: Cardiology

## 2019-09-09 VITALS — BP 122/66 | HR 56 | Temp 97.3°F | Ht 63.0 in | Wt 243.0 lb

## 2019-09-09 DIAGNOSIS — E7800 Pure hypercholesterolemia, unspecified: Secondary | ICD-10-CM

## 2019-09-09 DIAGNOSIS — Z5181 Encounter for therapeutic drug level monitoring: Secondary | ICD-10-CM

## 2019-09-09 DIAGNOSIS — I10 Essential (primary) hypertension: Secondary | ICD-10-CM

## 2019-09-09 DIAGNOSIS — I4891 Unspecified atrial fibrillation: Secondary | ICD-10-CM

## 2019-09-09 DIAGNOSIS — I480 Paroxysmal atrial fibrillation: Secondary | ICD-10-CM | POA: Diagnosis not present

## 2019-09-09 DIAGNOSIS — I2510 Atherosclerotic heart disease of native coronary artery without angina pectoris: Secondary | ICD-10-CM

## 2019-09-09 LAB — CBC
Hematocrit: 37.7 % (ref 34.0–46.6)
Hemoglobin: 12.9 g/dL (ref 11.1–15.9)
MCH: 32.3 pg (ref 26.6–33.0)
MCHC: 34.2 g/dL (ref 31.5–35.7)
MCV: 95 fL (ref 79–97)
Platelets: 191 10*3/uL (ref 150–450)
RBC: 3.99 x10E6/uL (ref 3.77–5.28)
RDW: 14 % (ref 11.7–15.4)
WBC: 6.7 10*3/uL (ref 3.4–10.8)

## 2019-09-09 LAB — BASIC METABOLIC PANEL
BUN/Creatinine Ratio: 13 (ref 12–28)
BUN: 14 mg/dL (ref 8–27)
CO2: 22 mmol/L (ref 20–29)
Calcium: 9.3 mg/dL (ref 8.7–10.3)
Chloride: 101 mmol/L (ref 96–106)
Creatinine, Ser: 1.04 mg/dL — ABNORMAL HIGH (ref 0.57–1.00)
GFR calc Af Amer: 61 mL/min/{1.73_m2} (ref >59)
GFR calc non Af Amer: 53 mL/min/{1.73_m2} — ABNORMAL LOW (ref >59)
Glucose: 91 mg/dL (ref 65–99)
Potassium: 4.3 mmol/L (ref 3.5–5.2)
Sodium: 140 mmol/L (ref 134–144)

## 2019-09-09 LAB — MAGNESIUM: Magnesium: 2 mg/dL (ref 1.6–2.3)

## 2019-09-09 LAB — POCT INR: INR: 2.5 (ref 2.0–3.0)

## 2019-09-09 NOTE — Patient Instructions (Signed)
Medication Instructions:  NO CHANGE *If you need a refill on your cardiac medications before your next appointment, please call your pharmacy*  Lab Work: Your physician recommends that you HAVE LAB WORK TODAY If you have labs (blood work) drawn today and your tests are completely normal, you will receive your results only by: . MyChart Message (if you have MyChart) OR . A paper copy in the mail If you have any lab test that is abnormal or we need to change your treatment, we will call you to review the results.  Follow-Up: At [facility], you and your health needs are our priority.  As part of our continuing mission to provide you with exceptional heart care, we have created designated Provider Care Teams.  These Care Teams include your primary Cardiologist (physician) and Advanced Practice Providers (APPs -  Physician Assistants and Nurse Practitioners) who all work together to provide you with the care you need, when you need it.  Your next appointment:   6 month(s)  The format for your next appointment:   Either In Person or Virtual  Provider:   You may see Brian Crenshaw, MD or one of the following Advanced Practice Providers on your designated Care Team:    Luke Kilroy, PA-C  Callie Goodrich, PA-C  Jesse Cleaver, FNP    

## 2019-09-10 ENCOUNTER — Ambulatory Visit: Payer: Medicare Other

## 2019-09-12 ENCOUNTER — Other Ambulatory Visit: Payer: Self-pay

## 2019-09-12 ENCOUNTER — Ambulatory Visit (INDEPENDENT_AMBULATORY_CARE_PROVIDER_SITE_OTHER): Payer: Medicare Other

## 2019-09-12 DIAGNOSIS — Z23 Encounter for immunization: Secondary | ICD-10-CM | POA: Diagnosis not present

## 2019-09-16 DIAGNOSIS — M0589 Other rheumatoid arthritis with rheumatoid factor of multiple sites: Secondary | ICD-10-CM | POA: Diagnosis not present

## 2019-10-08 ENCOUNTER — Other Ambulatory Visit: Payer: Self-pay

## 2019-10-08 ENCOUNTER — Ambulatory Visit (INDEPENDENT_AMBULATORY_CARE_PROVIDER_SITE_OTHER): Payer: Medicare Other | Admitting: *Deleted

## 2019-10-08 DIAGNOSIS — Z5181 Encounter for therapeutic drug level monitoring: Secondary | ICD-10-CM

## 2019-10-08 DIAGNOSIS — I4891 Unspecified atrial fibrillation: Secondary | ICD-10-CM | POA: Diagnosis not present

## 2019-10-08 LAB — POCT INR: INR: 3.3 — AB (ref 2.0–3.0)

## 2019-10-08 NOTE — Patient Instructions (Signed)
Hold warfarin tonight then resume 1/2 tablet daily except 1 tablet each Monday and Friday.  Repeat INR in 4 weeks.

## 2019-10-12 ENCOUNTER — Other Ambulatory Visit: Payer: Self-pay | Admitting: Cardiology

## 2019-10-15 ENCOUNTER — Other Ambulatory Visit: Payer: Self-pay | Admitting: Family Medicine

## 2019-10-15 ENCOUNTER — Encounter: Payer: Self-pay | Admitting: Family Medicine

## 2019-10-15 MED ORDER — ESCITALOPRAM OXALATE 20 MG PO TABS: 20.0000 mg | ORAL_TABLET | Freq: Every day | ORAL | 1 refills | Status: DC

## 2019-10-16 ENCOUNTER — Telehealth: Payer: Self-pay | Admitting: Family Medicine

## 2019-10-16 NOTE — Telephone Encounter (Signed)
Medication was sent in on 12/22

## 2019-10-16 NOTE — Telephone Encounter (Signed)
Medication Refill - Medication: escitalopram (LEXAPRO) 20 MG tablet  Has the patient contacted their pharmacy? Yes.  Pharmacy called and said that they got two different instruction on how patient should take medication and they need a new Rx with the correct instructions please.  (Agent: If no, request that the patient contact the pharmacy for the refill.) (Agent: If yes, when and what did the pharmacy advise?)  Preferred Pharmacy (with phone number or street name): [id number] - [place], [place]  Agent: Please be advised that RX refills may take up to 3 business days. We ask that you follow-up with your pharmacy.

## 2019-10-17 ENCOUNTER — Other Ambulatory Visit: Payer: Self-pay

## 2019-10-17 MED ORDER — ESCITALOPRAM OXALATE 20 MG PO TABS: 20.0000 mg | ORAL_TABLET | Freq: Every day | ORAL | 1 refills | Status: DC

## 2019-10-17 NOTE — Telephone Encounter (Signed)
I have not seen her in nearly a year. I have sent in a prescription for 20 mg for the month but she needs a virtual follow up for me to document how she is doing to keep it going please call and arrange

## 2019-10-18 ENCOUNTER — Other Ambulatory Visit: Payer: Self-pay | Admitting: Cardiology

## 2019-10-18 DIAGNOSIS — I480 Paroxysmal atrial fibrillation: Secondary | ICD-10-CM

## 2019-11-05 ENCOUNTER — Other Ambulatory Visit: Payer: Self-pay

## 2019-11-05 ENCOUNTER — Ambulatory Visit (INDEPENDENT_AMBULATORY_CARE_PROVIDER_SITE_OTHER): Payer: Medicare Other | Admitting: *Deleted

## 2019-11-05 DIAGNOSIS — Z5181 Encounter for therapeutic drug level monitoring: Secondary | ICD-10-CM

## 2019-11-05 DIAGNOSIS — I4891 Unspecified atrial fibrillation: Secondary | ICD-10-CM

## 2019-11-05 LAB — POCT INR: INR: 2.5 (ref 2.0–3.0)

## 2019-11-05 NOTE — Patient Instructions (Signed)
Continue warfarin 1/2 tablet daily except 1 tablet each Monday and Friday.  Repeat INR in 4 weeks.

## 2019-11-08 ENCOUNTER — Other Ambulatory Visit: Payer: Self-pay | Admitting: Family Medicine

## 2019-11-14 ENCOUNTER — Ambulatory Visit (INDEPENDENT_AMBULATORY_CARE_PROVIDER_SITE_OTHER): Payer: Medicare Other | Admitting: Family Medicine

## 2019-11-14 ENCOUNTER — Other Ambulatory Visit: Payer: Self-pay

## 2019-11-14 DIAGNOSIS — I10 Essential (primary) hypertension: Secondary | ICD-10-CM

## 2019-11-14 DIAGNOSIS — I4891 Unspecified atrial fibrillation: Secondary | ICD-10-CM | POA: Diagnosis not present

## 2019-11-14 DIAGNOSIS — H01003 Unspecified blepharitis right eye, unspecified eyelid: Secondary | ICD-10-CM | POA: Diagnosis not present

## 2019-11-14 DIAGNOSIS — H01006 Unspecified blepharitis left eye, unspecified eyelid: Secondary | ICD-10-CM | POA: Diagnosis not present

## 2019-11-14 DIAGNOSIS — E785 Hyperlipidemia, unspecified: Secondary | ICD-10-CM | POA: Diagnosis not present

## 2019-11-14 DIAGNOSIS — R739 Hyperglycemia, unspecified: Secondary | ICD-10-CM

## 2019-11-14 DIAGNOSIS — I159 Secondary hypertension, unspecified: Secondary | ICD-10-CM | POA: Diagnosis not present

## 2019-11-14 MED ORDER — AMOXICILLIN 500 MG PO CAPS: 500.0000 mg | ORAL_CAPSULE | Freq: Three times a day (TID) | ORAL | 0 refills | Status: DC

## 2019-11-14 MED ORDER — PREDNISONE 10 MG PO TABS: ORAL_TABLET | ORAL | 0 refills | Status: DC

## 2019-11-15 LAB — COMPREHENSIVE METABOLIC PANEL
ALT: 18 U/L (ref 0–35)
AST: 22 U/L (ref 0–37)
Albumin: 3.8 g/dL (ref 3.5–5.2)
Alkaline Phosphatase: 76 U/L (ref 39–117)
BUN: 18 mg/dL (ref 6–23)
CO2: 26 mEq/L (ref 19–32)
Calcium: 9 mg/dL (ref 8.4–10.5)
Chloride: 104 mEq/L (ref 96–112)
Creatinine, Ser: 1.04 mg/dL (ref 0.40–1.20)
GFR: 51.77 mL/min — ABNORMAL LOW (ref >60.00)
Glucose, Bld: 81 mg/dL (ref 70–99)
Potassium: 4.3 mEq/L (ref 3.5–5.1)
Sodium: 139 mEq/L (ref 135–145)
Total Bilirubin: 0.4 mg/dL (ref 0.2–1.2)
Total Protein: 7 g/dL (ref 6.0–8.3)

## 2019-11-15 LAB — LIPID PANEL
Cholesterol: 172 mg/dL (ref 0–200)
HDL: 46.9 mg/dL (ref >39.00)
LDL Cholesterol: 98 mg/dL (ref 0–99)
NonHDL: 124.95
Total CHOL/HDL Ratio: 4
Triglycerides: 136 mg/dL (ref 0.0–149.0)
VLDL: 27.2 mg/dL (ref 0.0–40.0)

## 2019-11-15 LAB — CBC
HCT: 39.3 % (ref 36.0–46.0)
Hemoglobin: 12.9 g/dL (ref 12.0–15.0)
MCHC: 32.8 g/dL (ref 30.0–36.0)
MCV: 100.5 fl — ABNORMAL HIGH (ref 78.0–100.0)
Platelets: 186 10*3/uL (ref 150.0–400.0)
RBC: 3.91 Mil/uL (ref 3.87–5.11)
RDW: 14.9 % (ref 11.5–15.5)
WBC: 6.4 10*3/uL (ref 4.0–10.5)

## 2019-11-15 LAB — TSH: TSH: 3.61 u[IU]/mL (ref 0.35–4.50)

## 2019-11-15 LAB — HEMOGLOBIN A1C: Hgb A1c MFr Bld: 5.6 % (ref 4.6–6.5)

## 2019-11-17 DIAGNOSIS — H01009 Unspecified blepharitis unspecified eye, unspecified eyelid: Secondary | ICD-10-CM | POA: Insufficient documentation

## 2019-11-17 NOTE — Assessment & Plan Note (Signed)
Long history of dry eyes which has worsened and now she has painful swollen crusty eyelids. Has been treated in the past with antibiotics and steroids with good response. Will try this approach and if still symptomatic patient agrees to contact her opthamologist.

## 2019-11-17 NOTE — Assessment & Plan Note (Signed)
Asymptomatic and tolerating meds.

## 2019-11-17 NOTE — Assessment & Plan Note (Signed)
hgba1c acceptable, minimize simple carbs. Increase exercise as tolerated.  

## 2019-11-17 NOTE — Assessment & Plan Note (Signed)
Monitor and report any concerns, no changes to meds. Encouraged heart healthy diet such as the DASH diet and exercise as tolerated.  ?

## 2019-11-17 NOTE — Assessment & Plan Note (Signed)
Statin intolerant, Encouraged heart healthy diet, increase exercise, avoid trans fats, consider a krill oil cap daily

## 2019-11-17 NOTE — Progress Notes (Signed)
Virtual Visit via phone Note  I connected with [name] on 11/14/19 at  3:00 PM EST by a phone enabled telemedicine application and verified that I am speaking with the correct person using two identifiers.  Location: Patient: home Provider: office   I discussed the limitations of evaluation and management by telemedicine and the availability of in person appointments. The patient expressed understanding and agreed to proceed. Magdalene Molly, CMA was able to get the patient set up on a visit, phone after being unable to set up a video visit   Subjective:    Patient ID: [name], female    DOB: 1945-11-03, 74 y.o.   MRN: [id number]  No chief complaint on file.   HPI Patient is in today for chronic medical concerns. She is denying any recent febrile illness or hospitalizations. She is maintaining quarantine well and trying to maintain a heart healthy diet. Denies CP/palp/SOB/HA/congestion/fevers/GI or GU c/o. Taking meds as prescribed. She notes trouble with dry eyes and now has crusty, red eyes with swollen and irritated eye lids. Also notes itchy ears at times as well.  Past Medical History:  Diagnosis Date  . Anxiety   . Arthritis   . CAD (coronary artery disease)   . Crohn's colitis ([place])    she reports ulcerative colitis beginning in her 41's, biopsies 2008 suggest Crohn's not UC  . Depression   . GERD (gastroesophageal reflux disease)   . Hyperlipidemia   . Hypertension   . Keloid of skin   . Obesity   . OSA (obstructive sleep apnea)   . Pancreatitis   . Paroxysmal atrial fibrillation ([facility])   . Pseudoaneurysm of right femoral artery ([place])   . Renal oncocytoma    s/p right nephrectomy  . Rheumatoid arthritis ([place]) 05/29/2017    Past Surgical History:  Procedure Laterality Date  . BREAST BIOPSY  1996   benign lesion   . Cataract surgery Left 11/13/14  . CHOLECYSTECTOMY  10/2006  . COLONOSCOPY W/ BIOPSIES    . CORONARY ANGIOPLASTY    .  ESOPHAGOGASTRODUODENOSCOPY    . NEPHRECTOMY  10/2006   right secondary to oncocytoma   . RIGHT FEMORAL  PSEUDOANEURYSM & RIGHT GROIN HEMATOMA EVACUATION  02/2007  . RIGHT RENAL ARTERY REPAIR  1976  . TUBAL LIGATION      Family History  Problem Relation Age of Onset  . Colitis Father   . Heart disease Father   . Hypertension Mother   . Coronary artery disease Mother   . Stroke Mother   . Clotting disorder Mother   . Heart disease Mother   . Allergies Mother   . Stroke Brother   . Colon cancer Paternal Aunt   . Clotting disorder Brother   . Clotting disorder Sister   . Asthma Daughter   . Asthma Maternal Uncle   . Colon polyps Neg Hx   . Kidney disease Neg Hx     Social History   Socioeconomic History  . Marital status: Married    Spouse name: Not on file  . Number of children: 3  . Years of education: Not on file  . Highest education level: Not on file  Occupational History  . Occupation: Retired    Fish farm manager: RETIRED    Comment: retired  Tobacco Use  . Smoking status: Never Smoker  . Smokeless tobacco: Never Used  Substance and Sexual Activity  . Alcohol use: No  . Drug use: No  . Sexual activity: Never  Other Topics Concern  . 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 Difficulty of Paying Living Expenses: Not on file  Food Insecurity:   . Worried About Charity fundraiser in the Last Year: Not on file  . Ran Out of Food in the Last Year: Not on file  Transportation Needs:   . Lack of Transportation (Medical): Not on file  . Lack of Transportation (Non-Medical): Not on file  Physical Activity:   . Days of Exercise per Week: Not on file  . Minutes of Exercise per Session: Not on file  Stress:   . Feeling of Stress : Not on file  Social Connections:   .  Frequency of Communication with Friends and Family: Not on file  . Frequency of Social Gatherings with Friends and Family: Not on file  . Attends Religious Services: Not on file  . Active Member of Clubs or Organizations: Not on file  . Attends Archivist Meetings: Not on file  . Marital Status: Not on file  Intimate Partner Violence:   . Fear of Current or Ex-Partner: Not on file  . Emotionally Abused: Not on file  . Physically Abused: Not on file  . Sexually Abused: Not on file    Outpatient Medications Prior to Visit  Medication Sig Dispense Refill  . warfarin (COUMADIN) 2.5 MG tablet TAKE 1/2 TO 1 TABLET DAILY AS DIRECTED BY COUMADIN CLINIC 90 tablet 0  . acetaminophen (TYLENOL) 325 MG tablet Take 650 mg by mouth every 6 (six) hours as needed.    Marland Kitchen amLODipine (NORVASC) 10 MG tablet TAKE 1 TABLET BY MOUTH EVERY DAY 90 tablet 1  . dofetilide (TIKOSYN) 125 MCG capsule TAKE 3 CAPSULES BY MOUTH TWICE A DAY 540 capsule 3  . doxazosin (CARDURA) 2 MG tablet Take 1 tablet (2 mg total) by mouth daily. 90 tablet 2  . escitalopram (LEXAPRO) 20 MG tablet TAKE 1 TABLET BY MOUTH EVERY DAY 90 tablet 1  . esomeprazole (NEXIUM) 40 MG capsule Take 1 capsule (40 mg total) by mouth daily. Take in the morning before breakfast. 30 capsule 11  . hydrocortisone (ANUSOL-HC) 25 MG suppository UNWRAP AND INSERT 1 SUPPOSITORY RECTALLY AT BEDTIME AS NEEDED FOR HEMORRHOIDS OR ITCHING 12 suppository 1  . InFLIXimab (REMICADE IV) Inject into the vein as directed.    . loperamide (IMODIUM A-D) 2 MG tablet Take 1 mg by mouth daily as needed. Dihrrhea    . metoprolol tartrate (LOPRESSOR) 25 MG tablet Take 0.5 tablets (12.5 mg total) by mouth daily. 45 tablet 1  . Multiple Vitamin (MULTIVITAMIN) capsule Take 1 capsule by mouth daily. 1 daily      . nitroGLYCERIN (NITROSTAT) 0.4 MG SL tablet Place 1 tablet (0.4 mg total) under the tongue every 5 (five) minutes as needed for chest pain. Do not exceed 3 tabs in 15  minutes 75 tablet 3  . Probiotic Product ([place]) CAPS Take 1 capsule by mouth daily. 1 per day    . valsartan (DIOVAN) 160 MG tablet Take 1 tablet (160 mg total) by mouth daily. 90 tablet 3   No facility-administered medications prior to visit.    Allergies  Allergen Reactions  . Hydralazine Diarrhea and Nausea Only    Anorexia,  upset stomach, burning pain in abdomin  . Cefdinir     Pt cannot take; may interfere with AFib.  . Codeine     Heart beats fast   . Guaifenesin Er Nausea And Vomiting and Other (See Comments)    Headaches; can tolerate liquid.  . Latex     Breathing problems  . Mucilgen [Psyllium]   . Percocet [Oxycodone-Acetaminophen]     Itching & swelling in jaw    Review of Systems  Constitutional: Positive for malaise/fatigue. Negative for fever.  HENT: Negative for congestion.   Eyes: Positive for discharge and redness. Negative for blurred vision.  Respiratory: Negative for shortness of breath.   Cardiovascular: Negative for chest pain, palpitations and leg swelling.  Gastrointestinal: Negative for abdominal pain, blood in stool and nausea.  Genitourinary: Negative for dysuria and frequency.  Musculoskeletal: Negative for falls.  Skin: Negative for rash.  Neurological: Negative for dizziness, loss of consciousness and headaches.  Endo/Heme/Allergies: Negative for environmental allergies.  Psychiatric/Behavioral: Negative for depression. The patient is nervous/anxious.        Objective:    Physical Exam unable to obtain via phone  There were no vitals taken for this visit. Wt Readings from Last 3 Encounters:  09/09/19 243 lb (110.2 kg)  12/03/18 247 lb (112 kg)  12/03/18 247 lb 9.6 oz (112.3 kg)    Diabetic Foot Exam - Simple   No data filed     Lab Results  Component Value Date   WBC 6.4 11/14/2019   HGB 12.9 11/14/2019   HCT 39.3 11/14/2019   PLT 186.0 11/14/2019   GLUCOSE 81 11/14/2019   CHOL 172 11/14/2019   TRIG 136.0  11/14/2019   HDL 46.90 11/14/2019   LDLCALC 98 11/14/2019   ALT 18 11/14/2019   AST 22 11/14/2019   NA 139 11/14/2019   K 4.3 11/14/2019   CL 104 11/14/2019   CREATININE 1.04 11/14/2019   BUN 18 11/14/2019   CO2 26 11/14/2019   TSH 3.61 11/14/2019   INR 2.5 11/05/2019   HGBA1C 5.6 11/14/2019    Lab Results  Component Value Date   TSH 3.61 11/14/2019   Lab Results  Component Value Date   WBC 6.4 11/14/2019   HGB 12.9 11/14/2019   HCT 39.3 11/14/2019   MCV 100.5 (H) 11/14/2019   PLT 186.0 11/14/2019   Lab Results  Component Value Date   NA 139 11/14/2019   K 4.3 11/14/2019   CO2 26 11/14/2019   GLUCOSE 81 11/14/2019   BUN 18 11/14/2019   CREATININE 1.04 11/14/2019   BILITOT 0.4 11/14/2019   ALKPHOS 76 11/14/2019   AST 22 11/14/2019   ALT 18 11/14/2019   PROT 7.0 11/14/2019   ALBUMIN 3.8 11/14/2019   CALCIUM 9.0 11/14/2019   ANIONGAP 11 11/13/2015   GFR 51.77 (L) 11/14/2019   Lab Results  Component Value Date   CHOL 172 11/14/2019   Lab Results  Component Value Date   HDL 46.90 11/14/2019   Lab Results  Component Value Date   LDLCALC 98 11/14/2019   Lab Results  Component Value Date   TRIG 136.0 11/14/2019   Lab Results  Component Value Date   CHOLHDL 4 11/14/2019   Lab Results  Component Value Date   HGBA1C 5.6 11/14/2019       Assessment & Plan:   Problem List Items Addressed This Visit    Hyperlipidemia    Statin intolerant, Encouraged heart healthy diet, increase exercise, avoid trans fats,  consider a krill oil cap daily      Relevant Orders   Lipid panel (Completed)   Essential hypertension    Monitor and report any concerns, no changes to meds. Encouraged heart healthy diet such as the DASH diet and exercise as tolerated.       Relevant Orders   CBC (Completed)   Comprehensive metabolic panel (Completed)   TSH (Completed)   ATRIAL FIBRILLATION    Asymptomatic and tolerating meds.       Hyperglycemia    hgba1c acceptable,  minimize simple carbs. Increase exercise as tolerated.       Relevant Orders   Hemoglobin A1c (Completed)   Hypertension - Primary   Relevant Orders   CBC (Completed)   Comprehensive metabolic panel (Completed)   TSH (Completed)   Blepharitis    Long history of dry eyes which has worsened and now she has painful swollen crusty eyelids. Has been treated in the past with antibiotics and steroids with good response. Will try this approach and if still symptomatic patient agrees to contact her opthamologist.          I am having Mianna Bosman. Keisling start on amoxicillin. I am also having her maintain her loperamide, multivitamin, [facility], InFLIXimab (REMICADE IV), acetaminophen, hydrocortisone, esomeprazole, nitroGLYCERIN, valsartan, dofetilide, doxazosin, warfarin, amLODipine, metoprolol tartrate, escitalopram, and predniSONE.  Meds ordered this encounter  Medications  . predniSONE (DELTASONE) 10 MG tablet    Sig: 4 tabs qd x 3 days, 3 tabs qd x 3 days, 2 tabs qd x 6 days, 1 tab qd  x 6 days, 1/2 tab qd x 6 days  Called in to pharmacy.    Dispense:  100 tablet    Refill:  0  . amoxicillin (AMOXIL) 500 MG capsule    Sig: Take 1 capsule (500 mg total) by mouth 3 (three) times daily.    Dispense:  30 capsule    Refill:  0     I discussed the assessment and treatment plan with the patient. The patient was provided an opportunity to ask questions and all were answered. The patient agreed with the plan and demonstrated an understanding of the instructions.   The patient was advised to call back or seek an in-person evaluation if the symptoms worsen or if the condition fails to improve as anticipated.  I provided 25 minutes of non-face-to-face time during this encounter.   Penni Homans, MD

## 2019-12-03 ENCOUNTER — Other Ambulatory Visit: Payer: Self-pay

## 2019-12-03 ENCOUNTER — Ambulatory Visit (INDEPENDENT_AMBULATORY_CARE_PROVIDER_SITE_OTHER): Payer: Medicare Other | Admitting: *Deleted

## 2019-12-03 DIAGNOSIS — I4891 Unspecified atrial fibrillation: Secondary | ICD-10-CM

## 2019-12-03 DIAGNOSIS — Z5181 Encounter for therapeutic drug level monitoring: Secondary | ICD-10-CM

## 2019-12-03 LAB — POCT INR: INR: 2.8 (ref 2.0–3.0)

## 2019-12-03 NOTE — Patient Instructions (Signed)
Continue warfarin 1/2 tablet daily except 1 tablet each Monday and Friday.  Repeat INR in 6 weeks.

## 2019-12-05 NOTE — Progress Notes (Addendum)
Virtual Visit via Audio Note  I connected with patient on 12/06/19 at  1:00 PM EST by audio enabled telemedicine application and verified that I am speaking with the correct person using two identifiers.   THIS ENCOUNTER IS A VIRTUAL VISIT DUE TO COVID-19 - PATIENT WAS NOT SEEN IN THE OFFICE. PATIENT HAS CONSENTED TO VIRTUAL VISIT / TELEMEDICINE VISIT   Location of patient: home  Location of provider: office  I discussed the limitations of evaluation and management by telemedicine and the availability of in person appointments. The patient expressed understanding and agreed to proceed.   Subjective:   [name] is a 74 y.o. female who presents for Medicare Annual (Subsequent) preventive examination.  Review of Systems:   Home Safety/Smoke Alarms: Feels safe in home. Smoke alarms in place.  Lives w/ husband in 1 story ranch home. Walk in shower w/ seat and grab bar.   Female:       Mammo-   12/18/18    Dexa scan-  06/05/17. Declines at this time.      CCS- No longer doing routine screening due to age.     Objective:     Vitals: Unable to assess. This visit is enabled though telemedicine due to Covid 19.   Advanced Directives 12/06/2019 12/03/2018 11/18/2018 11/30/2017 12/14/2015 08/28/2014  Does Patient Have a Medical Advance Directive? No No No No No No  Would patient like information on creating a medical advance directive? No - Patient declined Yes (MAU/Ambulatory/Procedural Areas - Information given) No - Patient declined No - Patient declined No - patient declined information Yes - Scientist, clinical (histocompatibility and immunogenetics) given    Tobacco Social History   Tobacco Use  Smoking Status Never Smoker  Smokeless Tobacco Never Used     Counseling given: Not Answered   Clinical Intake: Pain : No/denies pain     Past Medical History:  Diagnosis Date  . Anxiety   . Arthritis   . CAD (coronary artery disease)   . Crohn's colitis ([place])    she reports ulcerative colitis beginning in  her 77's, biopsies 2008 suggest Crohn's not UC  . Depression   . GERD (gastroesophageal reflux disease)   . Hyperlipidemia   . Hypertension   . Keloid of skin   . Obesity   . OSA (obstructive sleep apnea)   . Pancreatitis   . Paroxysmal atrial fibrillation ([facility])   . 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 Renal oncocytoma    s/p right nephrectomy  . Rheumatoid arthritis ([place]) 05/29/2017   Past Surgical History:  Procedure Laterality Date  . BREAST BIOPSY  1996   benign lesion   . Cataract surgery Left 11/13/14  . CHOLECYSTECTOMY  10/2006  . COLONOSCOPY W/ BIOPSIES    . CORONARY ANGIOPLASTY    . ESOPHAGOGASTRODUODENOSCOPY    . NEPHRECTOMY  10/2006   right secondary to oncocytoma   . RIGHT FEMORAL  PSEUDOANEURYSM & RIGHT GROIN HEMATOMA EVACUATION  02/2007  . RIGHT RENAL ARTERY REPAIR  1976  . TUBAL LIGATION     Family History  Problem Relation Age of Onset  . Colitis Father   . Heart disease Father   . Hypertension Mother   . Coronary artery disease Mother   . Stroke Mother   . Clotting disorder Mother   . Heart disease Mother   . Allergies Mother   . Stroke Brother   . Colon cancer Paternal Aunt   . Clotting disorder Brother   . Clotting disorder  Sister   . Asthma Daughter   . Asthma Maternal Uncle   . Colon polyps Neg Hx   . Kidney disease Neg Hx    Social History   Socioeconomic History  . Marital status: Married    Spouse name: Not on file  . Number of children: 3  . Years of education: Not on file  . Highest education level: Not on file  Occupational History  . Occupation: Retired    Fish farm manager: RETIRED    Comment: retired  Tobacco Use  . Smoking status: Never Smoker  . Smokeless tobacco: Never Used  Substance and Sexual Activity  . Alcohol use: No  . Drug use: No  . Sexual activity: Never  Other Topics Concern  . 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 Difficulty of Paying Living Expenses: Not hard at all  Food Insecurity:   . Worried About Charity fundraiser in the Last Year: Not on file  . Ran Out of Food in the Last Year: Not on file  Transportation Needs: No Transportation Needs  . Lack of Transportation (Medical): No  . Lack of Transportation (Non-Medical): No  Physical Activity:   . Days of Exercise per Week: Not on file  . Minutes of Exercise per Session: Not on file  Stress:   . Feeling of Stress : Not on file  Social Connections:   . Frequency of Communication with Friends and Family: Not on file  . Frequency of Social Gatherings with Friends and Family: Not on file  . Attends Religious Services: Not on file  . Active Member of Clubs or Organizations: Not on file  . Attends Archivist Meetings: Not on file  . Marital Status: Not on file    Outpatient Encounter Medications as of 12/06/2019  Medication Sig  . acetaminophen (TYLENOL) 325 MG tablet Take 650 mg by mouth every 6 (six) hours as needed.  Marland Kitchen amLODipine (NORVASC) 10 MG tablet TAKE 1 TABLET BY MOUTH EVERY DAY  . dofetilide (TIKOSYN) 125 MCG capsule TAKE 3 CAPSULES BY MOUTH TWICE A DAY  . doxazosin (CARDURA) 2 MG tablet Take 1 tablet (2 mg total) by mouth daily.  Marland Kitchen escitalopram (LEXAPRO) 20 MG tablet TAKE 1 TABLET BY MOUTH EVERY DAY  . esomeprazole (NEXIUM) 40 MG capsule Take 1 capsule (40 mg total) by mouth daily. Take in the morning before breakfast.  . folic acid (FOLVITE) 1 MG tablet Take 2 mg by mouth daily.  . hydrocortisone (ANUSOL-HC) 25 MG suppository UNWRAP AND INSERT 1 SUPPOSITORY RECTALLY AT BEDTIME AS NEEDED FOR HEMORRHOIDS OR ITCHING  . InFLIXimab (REMICADE IV) Inject into the vein as directed.  . loperamide (IMODIUM A-D) 2 MG tablet Take 1 mg by mouth daily as needed. Dihrrhea  . methotrexate (RHEUMATREX) 2.5 MG tablet  Take 10 mg by mouth once a week.  . metoprolol tartrate (LOPRESSOR) 25 MG tablet Take 0.5 tablets (12.5 mg total) by mouth daily.  . Multiple Vitamin (MULTIVITAMIN) capsule Take 1 capsule by mouth daily. 1 daily    . Probiotic Product ([place]) CAPS Take 1 capsule by mouth daily. 1 per day  . valsartan (DIOVAN) 160 MG tablet Take 1 tablet (160 mg total) by mouth daily.  Marland Kitchen  warfarin (COUMADIN) 2.5 MG tablet TAKE 1/2 TO 1 TABLET DAILY AS DIRECTED BY COUMADIN CLINIC  . nitroGLYCERIN (NITROSTAT) 0.4 MG SL tablet Place 1 tablet (0.4 mg total) under the tongue every 5 (five) minutes as needed for chest pain. Do not exceed 3 tabs in 15 minutes (Patient not taking: Reported on 12/06/2019)  . [DISCONTINUED] amoxicillin (AMOXIL) 500 MG capsule Take 1 capsule (500 mg total) by mouth 3 (three) times daily.  . [DISCONTINUED] losartan (COZAAR) 100 MG tablet Take 1 tablet (100 mg total) by mouth daily.  . [DISCONTINUED] predniSONE (DELTASONE) 10 MG tablet 4 tabs qd x 3 days, 3 tabs qd x 3 days, 2 tabs qd x 6 days, 1 tab qd  x 6 days, 1/2 tab qd x 6 days  Called in to pharmacy.   No facility-administered encounter medications on file as of 12/06/2019.    Activities of Daily Living In your present state of health, do you have any difficulty performing the following activities: 12/06/2019  Hearing? N  Vision? N  Difficulty concentrating or making decisions? N  Walking or climbing stairs? N  Dressing or bathing? N  Doing errands, shopping? N  Preparing Food and eating ? N  Using the Toilet? N  In the past six months, have you accidently leaked urine? N  Do you have problems with loss of bowel control? N  Managing your Medications? N  Managing your Finances? N  Housekeeping or managing your Housekeeping? N  Some recent data might be hidden    Patient Care Team: [name], MD as PCP - General (Family Medicine) Stanford Breed Denice Bors, MD as PCP - Cardiology (Cardiology) Gatha Mayer, MD  as Consulting Physician (Gastroenterology) Rigoberto Noel, MD as Consulting Physician (Pulmonary Disease) Calvert Cantor, MD as Consulting Physician (Ophthalmology) Hennie Duos, MD as Consulting Physician (Rheumatology) [place], Jennefer Bravo, MD as Referring Physician (Dermatology)    Assessment:   This is a routine wellness examination for Symphany. Physical assessment deferred to PCP.  Exercise Activities and Dietary recommendations Current Exercise Habits: The patient does not participate in regular exercise at present, Exercise limited by: None identified Diet (meal preparation, eat out, water intake, caffeinated beverages, dairy products, fruits and vegetables): 24 hr recall Breakfast: egg and cheese and applesauce Lunch: grilled [place] sandwich. Dinner:  [employer] w/ cream potatoes and green beans  Goals    . Increase physical activity     Walking        Fall Risk Fall Risk  12/06/2019 12/03/2018 11/30/2017 05/29/2017 12/14/2015  Falls in the past year? 0 1 No No No  Number falls in past yr: 0 0 - - -  Injury with Fall? 0 0 - - -  Follow up Education provided;Falls prevention discussed - - - -   Depression Screen PHQ 2/9 Scores 12/06/2019 12/03/2018 11/30/2017 05/29/2017  PHQ - 2 Score 0 1 1 0  PHQ- 9 Score - - - -     Cognitive Function Ad8 score reviewed for issues:  Issues making decisions:no  Less interest in hobbies / activities:no  Repeats questions, stories (family complaining):no  Trouble using ordinary gadgets (microwave, computer, phone):no  Forgets the month or year: no  Mismanaging finances: no  Remembering appts:no  Daily problems with thinking and/or memory:no Ad8 score is=0    MMSE - Mini Mental State Exam 12/03/2018 11/30/2017 12/14/2015  Orientation to time 5 5 5   Orientation to Place 5 5 5   Registration 3 3 3   Attention/  Calculation 5 5 5   Recall 3 3 3   Language- name 2 objects 2 2 2   Language- repeat 1 1 1   Language- follow 3 step  command 3 3 3   Language- read & follow direction 1 1 1   Write a sentence 1 1 1   Copy design 1 1 1   Total score 30 30 30         Immunization History  Administered Date(s) Administered  . Fluad Quad(high Dose 65+) 09/12/2019  . Influenza Split 07/23/2012  . Influenza Whole 09/06/2007, 08/11/2008, 08/24/2009, 08/03/2010  . Influenza, High Dose Seasonal PF 08/02/2013, 06/29/2016, 07/06/2017, 07/31/2018  . Influenza,inj,Quad PF,6+ Mos 06/27/2014, 06/26/2015  . Pneumococcal Conjugate-13 08/28/2014  . Pneumococcal Polysaccharide-23 09/13/2010  . Td 05/12/2009  . Zoster 02/11/2008    Screening Tests Health Maintenance  Topic Date Due  . FOOT EXAM  08/22/1956  . OPHTHALMOLOGY EXAM  08/22/1956  . TETANUS/TDAP  05/13/2019  . COLONOSCOPY  12/04/2020 (Originally 09/10/2014)  . HEMOGLOBIN A1C  05/13/2020  . MAMMOGRAM  12/18/2020  . INFLUENZA VACCINE  Completed  . DEXA SCAN  Completed  . Hepatitis C Screening  Completed  . PNA vac Low Risk Adult  Completed      Plan:   See you next year!  Continue to eat heart healthy diet (full of fruits, vegetables, whole grains, lean protein, water--limit salt, fat, and sugar intake) and increase physical activity as tolerated.  Continue doing brain stimulating activities (puzzles, reading, adult coloring books, staying active) to keep memory sharp.   Bring a copy of your living will and/or healthcare power of attorney to your next office visit.    I have personally reviewed and noted the following in the patient's chart:   . Medical and social history . Use of alcohol, tobacco or illicit drugs  . Current medications and supplements . Functional ability and status . Nutritional status . Physical activity . Advanced directives . List of other physicians . Hospitalizations, surgeries, and ER visits in previous 12 months . Vitals . Screenings to include cognitive, depression, and falls . Referrals and appointments  In addition, I have  reviewed and discussed with patient certain preventive protocols, quality metrics, and best practice recommendations. A written personalized care plan for preventive services as well as general preventive health recommendations were provided to patient.     Shela Nevin, [place]  12/06/2019   Medical screening examination/treatment was performed by qualified clinical staff member and as supervising physician I was immediately available for consultation/collaboration. I have reviewed documentation and agree with assessment and plan.  Penni Homans, MD

## 2019-12-06 ENCOUNTER — Encounter: Payer: Self-pay | Admitting: *Deleted

## 2019-12-06 ENCOUNTER — Ambulatory Visit (INDEPENDENT_AMBULATORY_CARE_PROVIDER_SITE_OTHER): Payer: Medicare Other | Admitting: *Deleted

## 2019-12-06 ENCOUNTER — Other Ambulatory Visit: Payer: Self-pay

## 2019-12-06 DIAGNOSIS — Z0000 Encounter for general adult medical examination without abnormal findings: Secondary | ICD-10-CM

## 2019-12-06 NOTE — Patient Instructions (Signed)
See you next year!  Continue to eat heart healthy diet (full of fruits, vegetables, whole grains, lean protein, water--limit salt, fat, and sugar intake) and increase physical activity as tolerated.  Continue doing brain stimulating activities (puzzles, reading, adult coloring books, staying active) to keep memory sharp.   Bring a copy of your living will and/or healthcare power of attorney to your next office visit.   [name] , Thank you for taking time to come for your Medicare Wellness Visit. I appreciate your ongoing commitment to your health goals. Please review the following plan we discussed and let me know if I can assist you in the future.   These are the goals we discussed: Goals    . Increase physical activity     Walking        This is a list of the screening recommended for you and due dates:  Health Maintenance  Topic Date Due  . Complete foot exam   08/22/1956  . Eye exam for diabetics  08/22/1956  . Tetanus Vaccine  05/13/2019  . Colon Cancer Screening  12/04/2020*  . Hemoglobin A1C  05/13/2020  . Mammogram  12/18/2020  . Flu Shot  Completed  . DEXA scan (bone density measurement)  Completed  .  Hepatitis C: One time screening is recommended by Center for Disease Control  (CDC) for  adults born from [birth year] through 1966.   Completed  . Pneumonia vaccines  Completed  *Topic was postponed. The date shown is not the original due date.    Preventive Care 75 Years and Older, Female Preventive care refers to lifestyle choices and visits with your health care provider that can promote health and wellness. This includes:  A yearly physical exam. This is also called an annual well check.  Regular dental and eye exams.  Immunizations.  Screening for certain conditions.  Healthy lifestyle choices, such as diet and exercise. What can I expect for my preventive care visit? Physical exam Your health care provider will check:  Height and weight. These may be  used to calculate body mass index (BMI), which is a measurement that tells if you are at a healthy weight.  Heart rate and blood pressure.  Your skin for abnormal spots. Counseling Your health care provider may ask you questions about:  Alcohol, tobacco, and drug use.  Emotional well-being.  Home and relationship well-being.  Sexual activity.  Eating habits.  History of falls.  Memory and ability to understand (cognition).  Work and work Statistician.  Pregnancy and menstrual history. What immunizations do I need?  Influenza (flu) vaccine  This is recommended every year. Tetanus, diphtheria, and pertussis (Tdap) vaccine  You may need a Td booster every 10 years. Varicella (chickenpox) vaccine  You may need this vaccine if you have not already been vaccinated. Zoster (shingles) vaccine  You may need this after age 32. Pneumococcal conjugate (PCV13) vaccine  One dose is recommended after age 41. Pneumococcal polysaccharide (PPSV23) vaccine  One dose is recommended after age 66. Measles, mumps, and rubella (MMR) vaccine  You may need at least one dose of MMR if you were born in 1957 or later. You may also need a second dose. Meningococcal conjugate (MenACWY) vaccine  You may need this if you have certain conditions. Hepatitis A vaccine  You may need this if you have certain conditions or if you travel or work in places where you may be exposed to hepatitis A. Hepatitis B vaccine  You may need this  if you have certain conditions or if you travel or work in places where you may be exposed to hepatitis B. Haemophilus influenzae type b (Hib) vaccine  You may need this if you have certain conditions. You may receive vaccines as individual doses or as more than one vaccine together in one shot (combination vaccines). Talk with your health care provider about the risks and benefits of combination vaccines. What tests do I need? Blood tests  Lipid and cholesterol  levels. These may be checked every 5 years, or more frequently depending on your overall health.  Hepatitis C test.  Hepatitis B test. Screening  Lung cancer screening. You may have this screening every year starting at age 54 if you have a 30-pack-year history of smoking and currently smoke or have quit within the past 15 years.  Colorectal cancer screening. All adults should have this screening starting at age 49 and continuing until age 26. Your health care provider may recommend screening at age 22 if you are at increased risk. You will have tests every 1-10 years, depending on your results and the type of screening test.  Diabetes screening. This is done by checking your blood sugar (glucose) after you have not eaten for a while (fasting). You may have this done every 1-3 years.  Mammogram. This may be done every 1-2 years. Talk with your health care provider about how often you should have regular mammograms.  BRCA-related cancer screening. This may be done if you have a family history of breast, ovarian, tubal, or peritoneal cancers. Other tests  Sexually transmitted disease (STD) testing.  Bone density scan. This is done to screen for osteoporosis. You may have this done starting at age 37. Follow these instructions at home: Eating and drinking  Eat a diet that includes fresh fruits and vegetables, whole grains, lean protein, and low-fat dairy products. Limit your intake of foods with high amounts of sugar, saturated fats, and salt.  Take vitamin and mineral supplements as recommended by your health care provider.  Do not drink alcohol if your health care provider tells you not to drink.  If you drink alcohol: ? Limit how much you have to 0-1 drink a day. ? Be aware of how much alcohol is in your drink. In the [place] one drink equals one 12 oz bottle of beer (355 mL), one 5 oz glass of wine (148 mL), or one 1 oz glass of hard liquor (44 mL). Lifestyle  Take daily care of  your teeth and gums.  Stay active. Exercise for at least 30 minutes on 5 or more days each week.  Do not use any products that contain nicotine or tobacco, such as cigarettes, e-cigarettes, and chewing tobacco. If you need help quitting, ask your health care provider.  If you are sexually active, practice safe sex. Use a condom or other form of protection in order to prevent STIs (sexually transmitted infections).  Talk with your health care provider about taking a low-dose aspirin or statin. What's next?  Go to your health care provider once a year for a well check visit.  Ask your health care provider how often you should have your eyes and teeth checked.  Stay up to date on all vaccines. This information is not intended to replace advice given to you by your health care provider. Make sure you discuss any questions you have with your health care provider. Document Revised: 10/04/2018 Document Reviewed: 10/04/2018 Elsevier Patient Education  2020 [employer].

## 2019-12-11 DIAGNOSIS — M0589 Other rheumatoid arthritis with rheumatoid factor of multiple sites: Secondary | ICD-10-CM | POA: Diagnosis not present

## 2019-12-25 DIAGNOSIS — Z23 Encounter for immunization: Secondary | ICD-10-CM | POA: Diagnosis not present

## 2020-01-01 DIAGNOSIS — L570 Actinic keratosis: Secondary | ICD-10-CM | POA: Diagnosis not present

## 2020-01-01 DIAGNOSIS — L409 Psoriasis, unspecified: Secondary | ICD-10-CM | POA: Diagnosis not present

## 2020-01-01 DIAGNOSIS — L820 Inflamed seborrheic keratosis: Secondary | ICD-10-CM | POA: Diagnosis not present

## 2020-01-08 ENCOUNTER — Other Ambulatory Visit: Payer: Self-pay | Admitting: Cardiology

## 2020-01-14 ENCOUNTER — Ambulatory Visit (INDEPENDENT_AMBULATORY_CARE_PROVIDER_SITE_OTHER): Payer: Medicare Other | Admitting: *Deleted

## 2020-01-14 ENCOUNTER — Other Ambulatory Visit: Payer: Self-pay

## 2020-01-14 DIAGNOSIS — Z5181 Encounter for therapeutic drug level monitoring: Secondary | ICD-10-CM

## 2020-01-14 DIAGNOSIS — I4891 Unspecified atrial fibrillation: Secondary | ICD-10-CM

## 2020-01-14 LAB — POCT INR: INR: 2.4 (ref 2.0–3.0)

## 2020-01-14 NOTE — Patient Instructions (Signed)
Continue warfarin 1/2 tablet daily except 1 tablet each Monday and Friday.  Repeat INR in 6 weeks.

## 2020-01-17 DIAGNOSIS — K5090 Crohn's disease, unspecified, without complications: Secondary | ICD-10-CM | POA: Diagnosis not present

## 2020-01-17 DIAGNOSIS — Q600 Renal agenesis, unilateral: Secondary | ICD-10-CM | POA: Diagnosis not present

## 2020-01-17 DIAGNOSIS — Z85828 Personal history of other malignant neoplasm of skin: Secondary | ICD-10-CM | POA: Diagnosis not present

## 2020-01-17 DIAGNOSIS — M65322 Trigger finger, left index finger: Secondary | ICD-10-CM | POA: Diagnosis not present

## 2020-01-17 DIAGNOSIS — M0589 Other rheumatoid arthritis with rheumatoid factor of multiple sites: Secondary | ICD-10-CM | POA: Diagnosis not present

## 2020-01-17 DIAGNOSIS — H209 Unspecified iridocyclitis: Secondary | ICD-10-CM | POA: Diagnosis not present

## 2020-01-17 DIAGNOSIS — M545 Low back pain: Secondary | ICD-10-CM | POA: Diagnosis not present

## 2020-01-21 DIAGNOSIS — Z23 Encounter for immunization: Secondary | ICD-10-CM | POA: Diagnosis not present

## 2020-01-30 DIAGNOSIS — M0589 Other rheumatoid arthritis with rheumatoid factor of multiple sites: Secondary | ICD-10-CM | POA: Diagnosis not present

## 2020-02-25 ENCOUNTER — Other Ambulatory Visit: Payer: Self-pay

## 2020-02-25 ENCOUNTER — Ambulatory Visit (INDEPENDENT_AMBULATORY_CARE_PROVIDER_SITE_OTHER): Payer: Medicare Other | Admitting: *Deleted

## 2020-02-25 DIAGNOSIS — I4891 Unspecified atrial fibrillation: Secondary | ICD-10-CM

## 2020-02-25 DIAGNOSIS — Z5181 Encounter for therapeutic drug level monitoring: Secondary | ICD-10-CM

## 2020-02-25 LAB — POCT INR: INR: 2.3 (ref 2.0–3.0)

## 2020-02-25 NOTE — Patient Instructions (Signed)
Continue warfarin 1/2 tablet daily except 1 tablet each Monday and Friday.  Repeat INR in 6 weeks.

## 2020-03-12 DIAGNOSIS — M0589 Other rheumatoid arthritis with rheumatoid factor of multiple sites: Secondary | ICD-10-CM | POA: Diagnosis not present

## 2020-03-18 NOTE — Progress Notes (Signed)
HPI:FU coronary artery disease and atrial fibrillation. Last cardiac catheterization on Mar 06, 2007 showed an ejection fraction of 55%. There is nonobstructive plaque in the LAD, and circumflex and the right coronary artery was normal. 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 Also note the patient is extremely sensitive to Coumadin. MRA of the abdomen in September of 2012 showed no left renal artery stenosis and previous right nephrectomy. 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 Echocardiogram in June of 2014 showed normal LV function, grade 1 diastolic dysfunction and mild left atrial enlargement. Carotid Dopplers in March 2017 showed 1-39% stenosis bilaterally.Event monitor January 2018 showed sinus with PACs, PVCs and ectopic atrial tachycardia.Since I last saw her,  she denies increased dyspnea, chest pain, palpitations or syncope.  No bleeding.  Current Outpatient Medications  Medication Sig Dispense Refill  . acetaminophen (TYLENOL) 325 MG tablet Take 650 mg by mouth every 6 (six) hours as needed.    Marland Kitchen amLODipine (NORVASC) 10 MG tablet TAKE 1 TABLET BY MOUTH EVERY DAY 90 tablet 1  . dofetilide (TIKOSYN) 125 MCG capsule TAKE 3 CAPSULES BY MOUTH TWICE A DAY 540 capsule 3  . doxazosin (CARDURA) 2 MG tablet Take 1 tablet (2 mg total) by mouth daily. 90 tablet 2  . escitalopram (LEXAPRO) 20 MG tablet TAKE 1 TABLET BY MOUTH EVERY DAY 90 tablet 1  . esomeprazole (NEXIUM) 40 MG capsule Take 1 capsule (40 mg total) by mouth daily. Take in the morning before breakfast. 30 capsule 11  . folic acid (FOLVITE) 1 MG tablet Take 2 mg by mouth daily.    . hydrocortisone (ANUSOL-HC) 25 MG suppository UNWRAP AND INSERT 1 SUPPOSITORY RECTALLY AT BEDTIME AS NEEDED FOR HEMORRHOIDS OR ITCHING 12 suppository 1  . InFLIXimab (REMICADE IV) Inject into the vein as directed.    . loperamide  (IMODIUM A-D) 2 MG tablet Take 1 mg by mouth daily as needed. Dihrrhea    . methotrexate (RHEUMATREX) 2.5 MG tablet Take 10 mg by mouth once a week.    . metoprolol tartrate (LOPRESSOR) 25 MG tablet Take 0.5 tablets (12.5 mg total) by mouth daily. 45 tablet 1  . Multiple Vitamin (MULTIVITAMIN) capsule Take 1 capsule by mouth daily. 1 daily      . nitroGLYCERIN (NITROSTAT) 0.4 MG SL tablet Place 1 tablet (0.4 mg total) under the tongue every 5 (five) minutes as needed for chest pain. Do not exceed 3 tabs in 15 minutes (Patient not taking: Reported on 12/06/2019) 75 tablet 3  . Probiotic Product ([place]) CAPS Take 1 capsule by mouth daily. 1 per day    . valsartan (DIOVAN) 160 MG tablet Take 1 tablet (160 mg total) by mouth daily. 90 tablet 3  . warfarin (COUMADIN) 2.5 MG tablet TAKE 1/2 TO 1 TABLET DAILY AS DIRECTED BY COUMADIN CLINIC 90 tablet 0   No current facility-administered medications for this visit.     Past Medical History:  Diagnosis Date  . Anxiety   . Arthritis   . CAD (coronary artery disease)   . Crohn's colitis ([place])    she reports ulcerative colitis beginning in her 47's, biopsies 2008 suggest Crohn's not UC  . Depression   . GERD (gastroesophageal reflux disease)   . Hyperlipidemia   . Hypertension   . Keloid of skin   . Obesity   . OSA (  obstructive sleep apnea)   . Pancreatitis   . Paroxysmal atrial fibrillation (H[facility])   . 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 Renal oncocytoma    s/p right nephrectomy  . Rheumatoid arthritis ([place]) 05/29/2017    Past Surgical History:  Procedure Laterality Date  . BREAST BIOPSY  1996   benign lesion   . Cataract surgery Left 11/13/14  . CHOLECYSTECTOMY  10/2006  . COLONOSCOPY W/ BIOPSIES    . CORONARY ANGIOPLASTY    . ESOPHAGOGASTRODUODENOSCOPY    . NEPHRECTOMY  10/2006   right secondary to oncocytoma   . RIGHT FEMORAL  PSEUDOANEURYSM & RIGHT GROIN HEMATOMA EVACUATION  02/2007  . RIGHT RENAL ARTERY  REPAIR  1976  . TUBAL LIGATION      Social History   Socioeconomic History  . Marital status: Married    Spouse name: Not on file  . Number of children: 3  . Years of education: Not on file  . Highest education level: Not on file  Occupational History  . Occupation: Retired    Fish farm manager: RETIRED    Comment: retired  Tobacco Use  . Smoking status: Never Smoker  . Smokeless tobacco: Never Used  Substance and Sexual Activity  . Alcohol use: No  . Drug use: No  . Sexual activity: Never  Other Topics Concern  . 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 Difficulty of Paying Living Expenses: Not hard at all  Food Insecurity:   . Worried About Charity fundraiser in the Last Year:   . Arboriculturist in the Last Year:   Transportation Needs: No Transportation Needs  . Lack of Transportation (Medical): No  . Lack of Transportation (Non-Medical): No  Physical Activity:   . Days of Exercise per Week:   . Minutes of Exercise per Session:   Stress:   . Feeling of Stress :   Social Connections:   . Frequency of Communication with Friends and Family:   . Frequency of Social Gatherings with Friends and Family:   . Attends Religious Services:   . Active Member of Clubs or Organizations:   . Attends Archivist Meetings:   Marland Kitchen Marital Status:   Intimate Partner Violence:   . Fear of Current or Ex-Partner:   . Emotionally Abused:   Marland Kitchen Physically Abused:   . Sexually Abused:     Family History  Problem Relation Age of Onset  . Colitis Father   . Heart disease Father   . Hypertension Mother   . Coronary artery disease Mother   . Stroke Mother   . Clotting disorder Mother   . Heart disease Mother   . Allergies Mother   . Stroke Brother   . Colon cancer Paternal  Aunt   . Clotting disorder Brother   . Clotting disorder Sister   . Asthma Daughter   . Asthma Maternal Uncle   . Colon polyps Neg Hx   . Kidney disease Neg Hx     ROS: no fevers or chills, productive cough, hemoptysis, dysphasia, odynophagia, melena, hematochezia, dysuria, hematuria, rash, seizure activity, orthopnea, PND, pedal edema, claudication. Remaining systems are negative.  Physical Exam: Well-developed obese in no acute distress.  Skin is warm and  dry.  HEENT is normal.  Neck is supple.  Chest is clear to auscultation with normal expansion.  Cardiovascular exam is regular rate and rhythm. 1/6  Systolic murmur Abdominal exam nontender or distended. No masses palpated. Extremities show trace edema. neuro grossly intact  ECG-sinus bradycardia at a rate of 52, normal axis, normal QT interval.  Personally reviewed  A/P  1 patient remains in sinus rhythm.  Continue Tikosyn and Coumadin.  2 coronary artery disease-patient denies chest pain.  Continue medical therapy.  She is intolerant to statins.  She is not on aspirin given need for Coumadin.  3 hypertension-blood pressure controlled.  Continue present medical regimen.  4 hyperlipidemia-patient is intolerant to statins.  She declines other lipid-lowering medications.  5 history of carotid artery disease-mild on most recent Dopplers.  Kirk Ruths, MD

## 2020-03-24 ENCOUNTER — Ambulatory Visit (INDEPENDENT_AMBULATORY_CARE_PROVIDER_SITE_OTHER): Payer: Medicare Other | Admitting: Cardiology

## 2020-03-24 ENCOUNTER — Ambulatory Visit (INDEPENDENT_AMBULATORY_CARE_PROVIDER_SITE_OTHER): Payer: Medicare Other | Admitting: Pharmacist

## 2020-03-24 ENCOUNTER — Other Ambulatory Visit: Payer: Self-pay

## 2020-03-24 ENCOUNTER — Encounter: Payer: Self-pay | Admitting: Cardiology

## 2020-03-24 VITALS — BP 124/74 | HR 60 | Temp 97.2°F | Ht 63.0 in | Wt 250.6 lb

## 2020-03-24 DIAGNOSIS — I4891 Unspecified atrial fibrillation: Secondary | ICD-10-CM

## 2020-03-24 DIAGNOSIS — Z5181 Encounter for therapeutic drug level monitoring: Secondary | ICD-10-CM

## 2020-03-24 DIAGNOSIS — I2510 Atherosclerotic heart disease of native coronary artery without angina pectoris: Secondary | ICD-10-CM

## 2020-03-24 DIAGNOSIS — E7800 Pure hypercholesterolemia, unspecified: Secondary | ICD-10-CM | POA: Diagnosis not present

## 2020-03-24 DIAGNOSIS — I480 Paroxysmal atrial fibrillation: Secondary | ICD-10-CM | POA: Diagnosis not present

## 2020-03-24 DIAGNOSIS — I10 Essential (primary) hypertension: Secondary | ICD-10-CM

## 2020-03-24 LAB — POCT INR: INR: 2 (ref 2.0–3.0)

## 2020-03-24 NOTE — Patient Instructions (Signed)

## 2020-04-15 ENCOUNTER — Other Ambulatory Visit: Payer: Self-pay | Admitting: Cardiology

## 2020-04-15 DIAGNOSIS — I480 Paroxysmal atrial fibrillation: Secondary | ICD-10-CM

## 2020-04-16 ENCOUNTER — Encounter (HOSPITAL_COMMUNITY): Payer: Self-pay | Admitting: Nurse Practitioner

## 2020-04-16 ENCOUNTER — Other Ambulatory Visit: Payer: Self-pay

## 2020-04-16 ENCOUNTER — Telehealth: Payer: Self-pay | Admitting: Cardiology

## 2020-04-16 ENCOUNTER — Ambulatory Visit (HOSPITAL_COMMUNITY)
Admission: RE | Admit: 2020-04-16 | Discharge: 2020-04-16 | Disposition: A | Discharge status: Home / Self Care | Payer: Medicare Other | Source: Ambulatory Visit | Attending: Nurse Practitioner | Admitting: Nurse Practitioner

## 2020-04-16 VITALS — BP 142/88 | HR 113 | Ht 63.0 in | Wt 249.2 lb

## 2020-04-16 DIAGNOSIS — I4891 Unspecified atrial fibrillation: Secondary | ICD-10-CM | POA: Diagnosis not present

## 2020-04-16 DIAGNOSIS — F329 Major depressive disorder, single episode, unspecified: Secondary | ICD-10-CM | POA: Insufficient documentation

## 2020-04-16 DIAGNOSIS — Z8249 Family history of ischemic heart disease and other diseases of the circulatory system: Secondary | ICD-10-CM | POA: Diagnosis not present

## 2020-04-16 DIAGNOSIS — E785 Hyperlipidemia, unspecified: Secondary | ICD-10-CM | POA: Diagnosis not present

## 2020-04-16 DIAGNOSIS — Z79899 Other long term (current) drug therapy: Secondary | ICD-10-CM | POA: Diagnosis not present

## 2020-04-16 DIAGNOSIS — Z888 Allergy status to other drugs, medicaments and biological substances status: Secondary | ICD-10-CM | POA: Diagnosis not present

## 2020-04-16 DIAGNOSIS — Z7901 Long term (current) use of anticoagulants: Secondary | ICD-10-CM | POA: Insufficient documentation

## 2020-04-16 DIAGNOSIS — I2510 Atherosclerotic heart disease of native coronary artery without angina pectoris: Secondary | ICD-10-CM | POA: Diagnosis not present

## 2020-04-16 DIAGNOSIS — Z823 Family history of stroke: Secondary | ICD-10-CM | POA: Insufficient documentation

## 2020-04-16 DIAGNOSIS — I10 Essential (primary) hypertension: Secondary | ICD-10-CM | POA: Diagnosis not present

## 2020-04-16 DIAGNOSIS — M069 Rheumatoid arthritis, unspecified: Secondary | ICD-10-CM | POA: Diagnosis not present

## 2020-04-16 DIAGNOSIS — I480 Paroxysmal atrial fibrillation: Secondary | ICD-10-CM | POA: Diagnosis not present

## 2020-04-16 DIAGNOSIS — K219 Gastro-esophageal reflux disease without esophagitis: Secondary | ICD-10-CM | POA: Insufficient documentation

## 2020-04-16 DIAGNOSIS — D6869 Other thrombophilia: Secondary | ICD-10-CM | POA: Diagnosis not present

## 2020-04-16 MED ORDER — METOPROLOL TARTRATE 25 MG PO TABS: 12.5000 mg | ORAL_TABLET | Freq: Two times a day (BID) | ORAL | Status: DC

## 2020-04-16 MED ORDER — METOPROLOL TARTRATE 25 MG PO TABS: 25.0000 mg | ORAL_TABLET | Freq: Two times a day (BID) | ORAL | Status: DC

## 2020-04-16 NOTE — Telephone Encounter (Signed)
Spoke with pt, she noticed being out of rhythm on the 23rd in the early morning after going to the bathroom. She is sweaty but no chest pain. She reports the rate has slowed down but it is still irregular. She will see the atrial fib clinic today at 2 pm.

## 2020-04-16 NOTE — Telephone Encounter (Signed)
Patient c/o Palpitations:  High priority if patient c/o lightheadedness, shortness of breath, or chest pain  1) How long have you had palpitations/irregular HR/ Afib? Are you having the symptoms now?been in Afib for the last 24 hrs  2) Are you currently experiencing lightheadedness, SOB or CP? no  3) Do you have a history of afib (atrial fibrillation) or irregular heart rhythm?  yes  4) Have you checked your BP or HR? (document readings if available):  5) Are you experiencing any other symptoms? sweaty

## 2020-04-16 NOTE — Progress Notes (Signed)
Primary Care Physician: [name], MD Referring Physician: Dr. Artemio Aly tiage    [name] is a 74 y.o. female with a h/o paroxysmal afib that is in the afib office for an episode  that has been going on for 36 hours. She continues on Tikosyn but had her BB decreased the first of June for fatigue.. It has been over a year since she has had any afib and this has not self corrected like her other episodes. She is very sleepy in afib. She  is on warfarin for a CHA2DS2VASc score of 4. She has had both Covid vaccines.   Today, she denies symptoms of palpitations, chest pain, shortness of breath, orthopnea, PND, lower extremity edema, dizziness, presyncope, syncope, or neurologic sequela. The patient is tolerating medications without difficulties and is otherwise without complaint today.   Past Medical History:  Diagnosis Date  . Anxiety   . Arthritis   . CAD (coronary artery disease)   . Crohn's colitis ([place])    she reports ulcerative colitis beginning in her 35's, biopsies 2008 suggest Crohn's not UC  . Depression   . GERD (gastroesophageal reflux disease)   . Hyperlipidemia   . Hypertension   . Keloid of skin   . Obesity   . OSA (obstructive sleep apnea)   . Pancreatitis   . Paroxysmal atrial fibrillation (H[facility])   . 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 Renal oncocytoma    s/p right nephrectomy  . Rheumatoid arthritis ([place]) 05/29/2017   Past Surgical History:  Procedure Laterality Date  . BREAST BIOPSY  1996   benign lesion   . Cataract surgery Left 11/13/14  . CHOLECYSTECTOMY  10/2006  . COLONOSCOPY W/ BIOPSIES    . CORONARY ANGIOPLASTY    . ESOPHAGOGASTRODUODENOSCOPY    . NEPHRECTOMY  10/2006   right secondary to oncocytoma   . RIGHT FEMORAL  PSEUDOANEURYSM & RIGHT GROIN HEMATOMA EVACUATION  02/2007  . RIGHT RENAL ARTERY REPAIR  1976  . TUBAL LIGATION      Current Outpatient Medications  Medication Sig Dispense Refill  . acetaminophen (TYLENOL)  325 MG tablet Take 650 mg by mouth every 6 (six) hours as needed.    Marland Kitchen amLODipine (NORVASC) 10 MG tablet TAKE 1 TABLET BY MOUTH EVERY DAY 90 tablet 1  . dofetilide (TIKOSYN) 125 MCG capsule TAKE 3 CAPSULES BY MOUTH TWICE A DAY 540 capsule 3  . doxazosin (CARDURA) 2 MG tablet Take 1 tablet (2 mg total) by mouth daily. 90 tablet 2  . escitalopram (LEXAPRO) 20 MG tablet TAKE 1 TABLET BY MOUTH EVERY DAY (Patient taking differently: Take 10 mg by mouth daily. ) 90 tablet 1  . esomeprazole (NEXIUM) 40 MG capsule Take 1 capsule (40 mg total) by mouth daily. Take in the morning before breakfast. 30 capsule 11  . folic acid (FOLVITE) 1 MG tablet Take 2 mg by mouth daily.    . hydrocortisone (ANUSOL-HC) 25 MG suppository UNWRAP AND INSERT 1 SUPPOSITORY RECTALLY AT BEDTIME AS NEEDED FOR HEMORRHOIDS OR ITCHING 12 suppository 1  . InFLIXimab (REMICADE IV) Inject into the vein as directed.    . loperamide (IMODIUM A-D) 2 MG tablet Take 1 mg by mouth as needed. Dihrrhea    . methotrexate (RHEUMATREX) 2.5 MG tablet Take 10 mg by mouth once a week.    . Multiple Vitamin (MULTIVITAMIN) capsule Take 1 capsule by mouth daily. 1 daily      . nitroGLYCERIN (NITROSTAT) 0.4 MG SL  tablet Place 1 tablet (0.4 mg total) under the tongue every 5 (five) minutes as needed for chest pain. Do not exceed 3 tabs in 15 minutes 75 tablet 3  . Probiotic Product ([place]) CAPS Take 1 capsule by mouth daily. 1 per day    . valsartan (DIOVAN) 160 MG tablet Take 1 tablet (160 mg total) by mouth daily. 90 tablet 3  . warfarin (COUMADIN) 2.5 MG tablet TAKE 1/2 TO 1 TABLET DAILY AS DIRECTED BY COUMADIN CLINIC 90 tablet 0  . metoprolol tartrate (LOPRESSOR) 25 MG tablet Take 1 tablet (25 mg total) by mouth in the morning and at bedtime.     No current facility-administered medications for this encounter.    Allergies  Allergen Reactions  . Hydralazine Diarrhea and Nausea Only    Anorexia, upset stomach, burning pain in  abdomin  . Cefdinir     Pt cannot take; may interfere with AFib.  . Codeine     Heart beats fast   . Guaifenesin Er Nausea And Vomiting and Other (See Comments)    Headaches; can tolerate liquid.  . Latex     Breathing problems  . Mucilgen [Psyllium]   . Percocet [Oxycodone-Acetaminophen]     Itching & swelling in jaw    Social History   Socioeconomic History  . Marital status: Married    Spouse name: Not on file  . Number of children: 3  . Years of education: Not on file  . Highest education level: Not on file  Occupational History  . Occupation: Retired    Fish farm manager: RETIRED    Comment: retired  Tobacco Use  . Smoking status: Never Smoker  . Smokeless tobacco: Never Used  Vaping Use  . Vaping Use: Never used  Substance and Sexual Activity  . Alcohol use: No  . Drug use: No  . Sexual activity: Never  Other Topics Concern  . 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 Difficulty of Paying Living Expenses: Not hard at all  Food Insecurity:   . Worried About Charity fundraiser in the Last Year:   . Arboriculturist in the Last Year:   Transportation Needs: No Transportation Needs  . Lack of Transportation (Medical): No  . Lack of Transportation (Non-Medical): No  Physical Activity:   . Days of Exercise per Week:   . Minutes of Exercise per Session:   Stress:   . Feeling of Stress :   Social Connections:   . Frequency of Communication with Friends and Family:   . Frequency of Social Gatherings with Friends and Family:   . Attends Religious Services:   . Active Member of Clubs or Organizations:   . Attends Archivist Meetings:   Marland Kitchen Marital Status:   Intimate Partner Violence:   . Fear of Current or Ex-Partner:   . Emotionally Abused:   Marland Kitchen  Physically Abused:   . Sexually Abused:     Family History  Problem Relation Age of Onset  . Colitis Father   . Heart disease Father   . Hypertension Mother   . Coronary artery disease Mother   . Stroke Mother   . Clotting disorder Mother   . Heart disease Mother   .  Allergies Mother   . Stroke Brother   . Colon cancer Paternal Aunt   . Clotting disorder Brother   . Clotting disorder Sister   . Asthma Daughter   . Asthma Maternal Uncle   . Colon polyps Neg Hx   . Kidney disease Neg Hx     ROS- All systems are reviewed and negative except as per the HPI above  Physical Exam: Vitals:   04/16/20 1430  BP: (!) 142/88  Pulse: (!) 113  Weight: 113 kg  Height: 5\' 3"  (1.6 m)   Wt Readings from Last 3 Encounters:  04/16/20 113 kg  03/24/20 113.7 kg  09/09/19 110.2 kg    Labs: Lab Results  Component Value Date   NA 139 11/14/2019   K 4.3 11/14/2019   CL 104 11/14/2019   CO2 26 11/14/2019   GLUCOSE 81 11/14/2019   BUN 18 11/14/2019   CREATININE 1.04 11/14/2019   CALCIUM 9.0 11/14/2019   PHOS 3.7 04/21/2014   MG 2.0 09/09/2019   Lab Results  Component Value Date   INR 2.0 03/24/2020   Lab Results  Component Value Date   CHOL 172 11/14/2019   HDL 46.90 11/14/2019   LDLCALC 98 11/14/2019   TRIG 136.0 11/14/2019     GEN- The patient is well appearing, alert and oriented x 3 today.   Head- normocephalic, atraumatic Eyes-  Sclera clear, conjunctiva pink Ears- hearing intact Oropharynx- clear Neck- supple, no JVP Lymph- no cervical lymphadenopathy Lungs- Clear to ausculation bilaterally, normal work of breathing Heart- irregular rate and rhythm, no murmurs, rubs or gallops, PMI not laterally displaced GI- soft, NT, ND, + BS Extremities- no clubbing, cyanosis, or edema MS- no significant deformity or atrophy Skin- no rash or lesion Psych- euthymic mood, full affect Neuro- strength and sensation are intact  EKG-afib at 113 bpm, qrs int 80 ms, qtc 482 ms      Assessment and Plan: 1. Afib with RVR Episode may have been contributed to recent decrease of  Metoprolol  Go back to metoprolol 25 mg bid  Continue Tikosyn 375 mcg bid Will see back on Monday and if still out of rhythm will set up for cardioversion  2. CHA2DS2VASc score of 4 Pt is on warfarin and if cardioversion is needed, she will need 4 weekly INR's or TEE guided Erin. Tenisha Fleece, [facility] [address] [place], [place] [address] [phone]

## 2020-04-16 NOTE — Patient Instructions (Signed)
Increase metoprolol to 25mg twice a day

## 2020-04-20 ENCOUNTER — Other Ambulatory Visit: Payer: Self-pay

## 2020-04-20 ENCOUNTER — Ambulatory Visit (HOSPITAL_COMMUNITY)
Admission: RE | Admit: 2020-04-20 | Discharge: 2020-04-20 | Disposition: A | Discharge status: Home / Self Care | Payer: Medicare Other | Source: Ambulatory Visit | Attending: Nurse Practitioner | Admitting: Nurse Practitioner

## 2020-04-20 VITALS — BP 150/66 | HR 56

## 2020-04-20 DIAGNOSIS — I480 Paroxysmal atrial fibrillation: Secondary | ICD-10-CM

## 2020-04-20 DIAGNOSIS — Z79899 Other long term (current) drug therapy: Secondary | ICD-10-CM | POA: Diagnosis not present

## 2020-04-20 DIAGNOSIS — I4891 Unspecified atrial fibrillation: Secondary | ICD-10-CM | POA: Diagnosis present

## 2020-04-20 NOTE — Progress Notes (Signed)
Pt in far an EKG as she felt that she went back into rhythm last Friday after BB dose was increased back to usual dose. It had been reduced 2 weeks earlier for c/o's of fatigue. EKG shows sinus brady at 56 bpm, pr int 174 ms, qrs int 92 ms, qtc 438 ms. BP 150/66. She will continue on dofetilide 375 mcg bid and metoprolol tartrate at 25 mg bid. F/u with [name] in December as scheduled.

## 2020-04-28 ENCOUNTER — Other Ambulatory Visit: Payer: Self-pay | Admitting: Cardiology

## 2020-04-28 ENCOUNTER — Other Ambulatory Visit: Payer: Self-pay | Admitting: Family Medicine

## 2020-05-05 ENCOUNTER — Other Ambulatory Visit: Payer: Self-pay

## 2020-05-05 ENCOUNTER — Ambulatory Visit (INDEPENDENT_AMBULATORY_CARE_PROVIDER_SITE_OTHER): Payer: Medicare Other | Admitting: *Deleted

## 2020-05-05 DIAGNOSIS — I4891 Unspecified atrial fibrillation: Secondary | ICD-10-CM | POA: Diagnosis not present

## 2020-05-05 DIAGNOSIS — Z5181 Encounter for therapeutic drug level monitoring: Secondary | ICD-10-CM

## 2020-05-05 LAB — POCT INR: INR: 2.4 (ref 2.0–3.0)

## 2020-05-05 NOTE — Patient Instructions (Signed)
Continue warfarin 1/2 tablet daily except 1 tablet each Monday and Friday.  Repeat INR in 8 weeks.

## 2020-06-04 DIAGNOSIS — M0589 Other rheumatoid arthritis with rheumatoid factor of multiple sites: Secondary | ICD-10-CM | POA: Diagnosis not present

## 2020-06-30 ENCOUNTER — Other Ambulatory Visit: Payer: Self-pay

## 2020-06-30 ENCOUNTER — Ambulatory Visit (INDEPENDENT_AMBULATORY_CARE_PROVIDER_SITE_OTHER): Payer: Medicare Other

## 2020-06-30 DIAGNOSIS — I4891 Unspecified atrial fibrillation: Secondary | ICD-10-CM

## 2020-06-30 DIAGNOSIS — Z5181 Encounter for therapeutic drug level monitoring: Secondary | ICD-10-CM | POA: Diagnosis not present

## 2020-06-30 LAB — POCT INR: INR: 2.9 (ref 2.0–3.0)

## 2020-06-30 NOTE — Patient Instructions (Signed)
Continue warfarin 1/2 tablet daily except 1 tablet each Monday and Friday.  Repeat INR in 8 weeks.   

## 2020-07-16 DIAGNOSIS — M0589 Other rheumatoid arthritis with rheumatoid factor of multiple sites: Secondary | ICD-10-CM | POA: Diagnosis not present

## 2020-07-22 ENCOUNTER — Other Ambulatory Visit: Payer: Self-pay | Admitting: Cardiology

## 2020-07-22 ENCOUNTER — Other Ambulatory Visit: Payer: Self-pay | Admitting: Family Medicine

## 2020-08-06 ENCOUNTER — Other Ambulatory Visit (HOSPITAL_BASED_OUTPATIENT_CLINIC_OR_DEPARTMENT_OTHER): Payer: Self-pay | Admitting: Family Medicine

## 2020-08-06 DIAGNOSIS — Z1231 Encounter for screening mammogram for malignant neoplasm of breast: Secondary | ICD-10-CM

## 2020-08-13 ENCOUNTER — Other Ambulatory Visit: Payer: Self-pay | Admitting: Family Medicine

## 2020-08-14 ENCOUNTER — Telehealth: Payer: Self-pay | Admitting: Cardiology

## 2020-08-14 NOTE — Telephone Encounter (Signed)
Spoke with pt, she reports she went into atrial fib on Friday last week, it woke her up but it felt funny and different from how it usually feels. She went back in rhythm on Sunday pm. Once she went back into rhythm she has been having dizziness when she stands too fast. If she gets up slowly, it does not happen. She does not get dizzy at any other time. Her bp is running 107-126/91-63. Her medications were confirmed and Follow up scheduled 08/19/20 with dr Stanford Breed. Will forward for dr Stanford Breed review to see if any medication changes needed prior to appointment.

## 2020-08-14 NOTE — Telephone Encounter (Signed)
STAT if patient feels like he/she is going to faint   1) Are you dizzy now? No- only when she stands  2) Do you feel faint or have you passed out? no  3) Do you have any other symptoms? Headaches- no energy and do not feel good  4) Have you checked your HR and BP (record if available)?09-11-20  145/72 and pulse 55  09-07-20 126/103 and pulse 70- she said she was in Atrial Fib, 08-09-20 107/91 and pulse 67- she was in Atrial Fib

## 2020-08-17 MED ORDER — AMLODIPINE BESYLATE 10 MG PO TABS
5.0000 mg | ORAL_TABLET | Freq: Every day | ORAL | 2 refills | Status: DC
Start: 2020-08-17 — End: 2020-08-19

## 2020-08-17 NOTE — Telephone Encounter (Signed)
Change amlodipine to 5 mg daily Kirk Ruths

## 2020-08-17 NOTE — Telephone Encounter (Signed)
Spoke with pt, Aware of dr crenshaw's recommendations.  °

## 2020-08-19 ENCOUNTER — Ambulatory Visit (INDEPENDENT_AMBULATORY_CARE_PROVIDER_SITE_OTHER): Payer: Medicare Other | Admitting: Cardiology

## 2020-08-19 ENCOUNTER — Encounter: Payer: Self-pay | Admitting: Cardiology

## 2020-08-19 ENCOUNTER — Other Ambulatory Visit: Payer: Self-pay

## 2020-08-19 VITALS — BP 164/68 | HR 63 | Ht 63.0 in | Wt 252.8 lb

## 2020-08-19 DIAGNOSIS — I480 Paroxysmal atrial fibrillation: Secondary | ICD-10-CM | POA: Diagnosis not present

## 2020-08-19 DIAGNOSIS — Z5181 Encounter for therapeutic drug level monitoring: Secondary | ICD-10-CM

## 2020-08-19 DIAGNOSIS — I6523 Occlusion and stenosis of bilateral carotid arteries: Secondary | ICD-10-CM

## 2020-08-19 LAB — BASIC METABOLIC PANEL
BUN/Creatinine Ratio: 14 (ref 12–28)
BUN: 17 mg/dL (ref 8–27)
CO2: 23 mmol/L (ref 20–29)
Calcium: 8.9 mg/dL (ref 8.7–10.3)
Chloride: 101 mmol/L (ref 96–106)
Creatinine, Ser: 1.2 mg/dL — ABNORMAL HIGH (ref 0.57–1.00)
GFR calc Af Amer: 51 mL/min/{1.73_m2} — ABNORMAL LOW (ref >59)
GFR calc non Af Amer: 45 mL/min/{1.73_m2} — ABNORMAL LOW (ref >59)
Glucose: 80 mg/dL (ref 65–99)
Potassium: 4.5 mmol/L (ref 3.5–5.2)
Sodium: 137 mmol/L (ref 134–144)

## 2020-08-19 LAB — MAGNESIUM: Magnesium: 2.2 mg/dL (ref 1.6–2.3)

## 2020-08-19 LAB — CBC
Hematocrit: 36.3 % (ref 34.0–46.6)
Hemoglobin: 12.2 g/dL (ref 11.1–15.9)
MCH: 33.3 pg — ABNORMAL HIGH (ref 26.6–33.0)
MCHC: 33.6 g/dL (ref 31.5–35.7)
MCV: 99 fL — ABNORMAL HIGH (ref 79–97)
Platelets: 190 10*3/uL (ref 150–450)
RBC: 3.66 x10E6/uL — ABNORMAL LOW (ref 3.77–5.28)
RDW: 14.1 % (ref 11.7–15.4)
WBC: 6.2 10*3/uL (ref 3.4–10.8)

## 2020-08-19 MED ORDER — AMLODIPINE BESYLATE 10 MG PO TABS: 10.0000 mg | ORAL_TABLET | Freq: Every day | ORAL | 2 refills | Status: DC

## 2020-08-19 NOTE — Patient Instructions (Signed)
Medication Instructions:   INCREASE AMLODIPINE TO 10 MG ONCE DAILY= 1 WHOLE TABLET ONCE DAILY  *If you need a refill on your cardiac medications before your next appointment, please call your pharmacy*   Lab Work:  Your physician recommends that you HAVE LAB WORK TODAY  If you have labs (blood work) drawn today and your tests are completely normal, you will receive your results only by: Marland Kitchen MyChart Message (if you have MyChart) OR . A paper copy in the mail If you have any lab test that is abnormal or we need to change your treatment, we will call you to review the results.   Testing/Procedures:  Your physician has requested that you have an echocardiogram. Echocardiography is a painless test that uses sound waves to create images of your heart. It provides your doctor with information about the size and shape of your heart and how well your heart's chambers and valves are working. This procedure takes approximately one hour. There are no restrictions for this procedure.[place] has requested that you have a carotid duplex. This test is an ultrasound of the carotid arteries in your neck. It looks at blood flow through these arteries that supply the brain with blood. Allow one hour for this exam. There are no restrictions or special instructions.[facility]    Follow-Up: At [facility], you and your health needs are our priority.  As part of our continuing mission to provide you with exceptional heart care, we have created designated Provider Care Teams.  These Care Teams include your primary Cardiologist (physician) and Advanced Practice Providers (APPs -  Physician Assistants and Nurse Practitioners) who all work together to provide you with the care you need, when you need it.  We recommend signing up for the patient portal called "MyChart".  Sign up information is provided on this After Visit Summary.  MyChart is used to connect with patients for  Virtual Visits (Telemedicine).  Patients are able to view lab/test results, encounter notes, upcoming appointments, etc.  Non-urgent messages can be sent to your provider as well.   To learn more about what you can do with MyChart, go to [email or web address].    Your next appointment:   6 month(s)  The format for your next appointment:   In Person  Provider:   Kirk Ruths, MD

## 2020-08-19 NOTE — Progress Notes (Signed)
HPI: FU coronary artery disease and atrial fibrillation. Last cardiac catheterization on Mar 06, 2007 showed an ejection fraction of 55%. There is nonobstructive plaque in the LAD, and circumflex and the right coronary artery was normal. 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 Also note the patient is extremely sensitive to Coumadin. MRA of the abdomen in September of 2012 showed no left renal artery stenosis and previous right nephrectomy. 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 Echocardiogram in June of 2014 showed normal LV function, grade 1 diastolic dysfunction and mild left atrial enlargement. Carotid Dopplers in March 2017 showed 1-39% stenosis bilaterally.Event monitor January 2018 showed sinus with PACs, PVCs and ectopic atrial tachycardia.Since I last saw her,she denies increased dyspnea, exertional chest pain or syncope.  She has some dizziness with standing.  Current Outpatient Medications  Medication Sig Dispense Refill  . acetaminophen (TYLENOL) 325 MG tablet Take 650 mg by mouth every 6 (six) hours as needed.    Marland Kitchen amLODipine (NORVASC) 10 MG tablet Take 0.5 tablets (5 mg total) by mouth daily. 90 tablet 2  . dofetilide (TIKOSYN) 125 MCG capsule TAKE 3 CAPSULES BY MOUTH TWICE A DAY 540 capsule 1  . doxazosin (CARDURA) 2 MG tablet TAKE 1 TABLET BY MOUTH EVERY DAY 90 tablet 2  . escitalopram (LEXAPRO) 20 MG tablet TAKE 1 TABLET BY MOUTH EVERY DAY 90 tablet 1  . esomeprazole (NEXIUM) 40 MG capsule Take 1 capsule (40 mg total) by mouth daily. Take in the morning before breakfast. 30 capsule 11  . folic acid (FOLVITE) 1 MG tablet Take 2 mg by mouth daily.    . hydrocortisone (ANUSOL-HC) 25 MG suppository UNWRAP AND INSERT 1 SUPPOSITORY RECTALLY AT BEDTIME AS NEEDED FOR HEMORRHOIDS OR ITCHING 12 suppository 1  . InFLIXimab (REMICADE IV) Inject into the vein as  directed.    . loperamide (IMODIUM A-D) 2 MG tablet Take 1 mg by mouth as needed. Dihrrhea    . methotrexate (RHEUMATREX) 2.5 MG tablet Take 10 mg by mouth once a week.    . metoprolol tartrate (LOPRESSOR) 25 MG tablet Take 1 tablet (25 mg total) by mouth in the morning and at bedtime.    . Multiple Vitamin (MULTIVITAMIN) capsule Take 1 capsule by mouth daily. 1 daily      . nitroGLYCERIN (NITROSTAT) 0.4 MG SL tablet Place 1 tablet (0.4 mg total) under the tongue every 5 (five) minutes as needed for chest pain. Do not exceed 3 tabs in 15 minutes 75 tablet 3  . Probiotic Product ([place]) CAPS Take 1 capsule by mouth daily. 1 per day    . valsartan (DIOVAN) 160 MG tablet TAKE 1 TABLET BY MOUTH EVERY DAY 90 tablet 1  . warfarin (COUMADIN) 2.5 MG tablet TAKE 1/2 TO 1 TABLET DAILY AS DIRECTED BY COUMADIN CLINIC 90 tablet 0   No current facility-administered medications for this visit.     Past Medical History:  Diagnosis Date  . Anxiety   . Arthritis   . CAD (coronary artery disease)   . Crohn's colitis ([place])    she reports ulcerative colitis beginning in her 58's, biopsies 2008 suggest Crohn's not UC  . Depression   . GERD (gastroesophageal reflux disease)   . Hyperlipidemia   . Hypertension   . Keloid of skin   . Obesity   . OSA (obstructive sleep  apnea)   . Pancreatitis   . Paroxysmal atrial fibrillation (HCC)   . 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 Renal oncocytoma    s/p right nephrectomy  . Rheumatoid arthritis ([place]) 05/29/2017    Past Surgical History:  Procedure Laterality Date  . BREAST BIOPSY  1996   benign lesion   . Cataract surgery Left 11/13/14  . CHOLECYSTECTOMY  10/2006  . COLONOSCOPY W/ BIOPSIES    . CORONARY ANGIOPLASTY    . ESOPHAGOGASTRODUODENOSCOPY    . NEPHRECTOMY  10/2006   right secondary to oncocytoma   . RIGHT FEMORAL  PSEUDOANEURYSM & RIGHT GROIN HEMATOMA EVACUATION  02/2007  . RIGHT RENAL ARTERY REPAIR  1976  . TUBAL  LIGATION      Social History   Socioeconomic History  . Marital status: Married    Spouse name: Not on file  . Number of children: 3  . Years of education: Not on file  . Highest education level: Not on file  Occupational History  . Occupation: Retired    Fish farm manager: RETIRED    Comment: retired  Tobacco Use  . Smoking status: Never Smoker  . Smokeless tobacco: Never Used  Vaping Use  . Vaping Use: Never used  Substance and Sexual Activity  . Alcohol use: No  . Drug use: No  . Sexual activity: Never  Other Topics Concern  . 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 Difficulty of Paying Living Expenses: Not hard at all  Food Insecurity:   . Worried About Charity fundraiser in the Last Year: Not on file  . Ran Out of Food in the Last Year: Not on file  Transportation Needs: No Transportation Needs  . Lack of Transportation (Medical): No  . Lack of Transportation (Non-Medical): No  Physical Activity:   . Days of Exercise per Week: Not on file  . Minutes of Exercise per Session: Not on file  Stress:   . Feeling of Stress : Not on file  Social Connections:   . Frequency of Communication with Friends and Family: Not on file  . Frequency of Social Gatherings with Friends and Family: Not on file  . Attends Religious Services: Not on file  . Active Member of Clubs or Organizations: Not on file  . Attends Archivist Meetings: Not on file  . Marital Status: Not on file  Intimate Partner Violence:   . Fear of Current or Ex-Partner: Not on file  . Emotionally Abused: Not on file  . Physically Abused: Not on file  . Sexually Abused: Not on file    Family History  Problem Relation Age of Onset  . Colitis Father   . Heart disease Father   . Hypertension Mother    . Coronary artery disease Mother   . Stroke Mother   . Clotting disorder Mother   . Heart disease Mother   . Allergies Mother   . Stroke Brother   . Colon cancer Paternal Aunt   . Clotting disorder Brother   . Clotting disorder Sister   . Asthma Daughter   . Asthma Maternal Uncle   . Colon polyps Neg Hx   . Kidney disease Neg Hx     ROS: no fevers  or chills, productive cough, hemoptysis, dysphasia, odynophagia, melena, hematochezia, dysuria, hematuria, rash, seizure activity, orthopnea, PND, pedal edema, claudication. Remaining systems are negative.  Physical Exam: Well-developed obese in no acute distress.  Skin is warm and dry.  HEENT is normal.  Neck is supple.  Chest is clear to auscultation with normal expansion.  Cardiovascular exam is regular rate and rhythm.  Abdominal exam nontender or distended. No masses palpated. Extremities show trace edema. neuro grossly intact  ECG-sinus bradycardia at a rate of 63, no ST changes.  Personally reviewed  A/P  1 paroxysmal atrial fibrillation-patient is in sinus rhythm this morning.  Continue present dose of Tikosyn.  Continue Coumadin with goal INR 2-3.  Hemoglobin, renal function, potassium and magnesium.  Repeat echocardiogram.  2 coronary artery disease-patient denies exertional chest pain.  Continue medical therapy.  Note she is intolerant to statins.  She is not on aspirin given need for Coumadin.  3 hypertension-patient's blood pressure is elevated; increase amlodipine to 10 mg daily and follow.  Note she has some orthostatic symptoms and I have asked her to increase her p.o. fluid intake.  4 hyperlipidemia-intolerant to statins.  She has previously declined other lipid-lowering medications.  5 history of carotid artery disease-repeat carotid Dopplers.  Kirk Ruths, MD

## 2020-08-25 ENCOUNTER — Ambulatory Visit (INDEPENDENT_AMBULATORY_CARE_PROVIDER_SITE_OTHER): Payer: Medicare Other

## 2020-08-25 ENCOUNTER — Other Ambulatory Visit: Payer: Self-pay

## 2020-08-25 DIAGNOSIS — I4891 Unspecified atrial fibrillation: Secondary | ICD-10-CM

## 2020-08-25 DIAGNOSIS — Z5181 Encounter for therapeutic drug level monitoring: Secondary | ICD-10-CM

## 2020-08-25 LAB — POCT INR: INR: 2.5 (ref 2.0–3.0)

## 2020-08-25 NOTE — Patient Instructions (Signed)
Continue warfarin 1/2 tablet daily except 1 tablet each Monday and Friday.  Repeat INR in 8 weeks.   

## 2020-08-27 DIAGNOSIS — M0589 Other rheumatoid arthritis with rheumatoid factor of multiple sites: Secondary | ICD-10-CM | POA: Diagnosis not present

## 2020-09-02 DIAGNOSIS — Q600 Renal agenesis, unilateral: Secondary | ICD-10-CM | POA: Diagnosis not present

## 2020-09-02 DIAGNOSIS — M545 Low back pain, unspecified: Secondary | ICD-10-CM | POA: Diagnosis not present

## 2020-09-02 DIAGNOSIS — Z6841 Body Mass Index (BMI) 40.0 and over, adult: Secondary | ICD-10-CM | POA: Diagnosis not present

## 2020-09-02 DIAGNOSIS — Z85828 Personal history of other malignant neoplasm of skin: Secondary | ICD-10-CM | POA: Diagnosis not present

## 2020-09-02 DIAGNOSIS — M0589 Other rheumatoid arthritis with rheumatoid factor of multiple sites: Secondary | ICD-10-CM | POA: Diagnosis not present

## 2020-09-02 DIAGNOSIS — K5090 Crohn's disease, unspecified, without complications: Secondary | ICD-10-CM | POA: Diagnosis not present

## 2020-09-02 DIAGNOSIS — H209 Unspecified iridocyclitis: Secondary | ICD-10-CM | POA: Diagnosis not present

## 2020-09-02 DIAGNOSIS — M65322 Trigger finger, left index finger: Secondary | ICD-10-CM | POA: Diagnosis not present

## 2020-09-03 ENCOUNTER — Other Ambulatory Visit: Payer: Self-pay

## 2020-09-03 ENCOUNTER — Ambulatory Visit (HOSPITAL_COMMUNITY)
Admission: RE | Admit: 2020-09-03 | Discharge: 2020-09-03 | Disposition: A | Discharge status: Home / Self Care | Payer: Medicare Other | Source: Ambulatory Visit | Attending: Cardiology | Admitting: Cardiology

## 2020-09-03 DIAGNOSIS — I6523 Occlusion and stenosis of bilateral carotid arteries: Secondary | ICD-10-CM | POA: Insufficient documentation

## 2020-09-04 ENCOUNTER — Encounter: Payer: Self-pay | Admitting: *Deleted

## 2020-09-10 ENCOUNTER — Other Ambulatory Visit: Payer: Self-pay

## 2020-09-10 ENCOUNTER — Ambulatory Visit (HOSPITAL_COMMUNITY): Payer: Medicare Other | Attending: Cardiovascular Disease

## 2020-09-10 DIAGNOSIS — I480 Paroxysmal atrial fibrillation: Secondary | ICD-10-CM | POA: Insufficient documentation

## 2020-09-10 LAB — ECHOCARDIOGRAM COMPLETE
Area-P 1/2: 2.83 cm2
S' Lateral: 3.4 cm

## 2020-09-21 ENCOUNTER — Ambulatory Visit (HOSPITAL_BASED_OUTPATIENT_CLINIC_OR_DEPARTMENT_OTHER): Payer: Medicare Other

## 2020-09-23 NOTE — Progress Notes (Deleted)
HPI: FU coronary artery disease and atrial fibrillation. Last cardiac catheterization on Mar 06, 2007 showed an ejection fraction of 55%. There is nonobstructive plaque in the LAD, and circumflex and the right coronary artery was normal. 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 Also note the patient is extremely sensitive to Coumadin. MRA of the abdomen in September of 2012 showed no left renal artery stenosis and previous right nephrectomy. 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 Event monitor January 2018 showed sinus with PACs, PVCs and ectopic atrial tachycardia. Echocardiogram November 2021 showed normal LV function, grade 2 diastolic dysfunction, moderate pulmonary hypertension, moderate left atrial enlargement, mild right atrial enlargement. Carotid Dopplers November 2021 showed 1 to 39% bilateral stenosis. Since I last saw her,  Current Outpatient Medications  Medication Sig Dispense Refill  . acetaminophen (TYLENOL) 325 MG tablet Take 650 mg by mouth every 6 (six) hours as needed.    Marland Kitchen amLODipine (NORVASC) 10 MG tablet Take 1 tablet (10 mg total) by mouth daily. 90 tablet 2  . dofetilide (TIKOSYN) 125 MCG capsule TAKE 3 CAPSULES BY MOUTH TWICE A DAY 540 capsule 1  . doxazosin (CARDURA) 2 MG tablet TAKE 1 TABLET BY MOUTH EVERY DAY 90 tablet 2  . escitalopram (LEXAPRO) 20 MG tablet TAKE 1 TABLET BY MOUTH EVERY DAY 90 tablet 1  . esomeprazole (NEXIUM) 40 MG capsule Take 1 capsule (40 mg total) by mouth daily. Take in the morning before breakfast. 30 capsule 11  . folic acid (FOLVITE) 1 MG tablet Take 2 mg by mouth daily.    . hydrocortisone (ANUSOL-HC) 25 MG suppository UNWRAP AND INSERT 1 SUPPOSITORY RECTALLY AT BEDTIME AS NEEDED FOR HEMORRHOIDS OR ITCHING 12 suppository 1  . InFLIXimab (REMICADE IV) Inject into the vein as directed.    . loperamide (IMODIUM A-D)  2 MG tablet Take 1 mg by mouth as needed. Dihrrhea    . methotrexate (RHEUMATREX) 2.5 MG tablet Take 10 mg by mouth once a week.    . metoprolol tartrate (LOPRESSOR) 25 MG tablet Take 1 tablet (25 mg total) by mouth in the morning and at bedtime.    . Multiple Vitamin (MULTIVITAMIN) capsule Take 1 capsule by mouth daily. 1 daily      . nitroGLYCERIN (NITROSTAT) 0.4 MG SL tablet Place 1 tablet (0.4 mg total) under the tongue every 5 (five) minutes as needed for chest pain. Do not exceed 3 tabs in 15 minutes 75 tablet 3  . Probiotic Product ([place]) CAPS Take 1 capsule by mouth daily. 1 per day    . valsartan (DIOVAN) 160 MG tablet TAKE 1 TABLET BY MOUTH EVERY DAY 90 tablet 1  . warfarin (COUMADIN) 2.5 MG tablet TAKE 1/2 TO 1 TABLET DAILY AS DIRECTED BY COUMADIN CLINIC 90 tablet 0   No current facility-administered medications for this visit.     Past Medical History:  Diagnosis Date  . Anxiety   . Arthritis   . CAD (coronary artery disease)   . Crohn's colitis ([place])    she reports ulcerative colitis beginning in her 57's, biopsies 2008 suggest Crohn's not UC  . Depression   . GERD (gastroesophageal reflux disease)   . Hyperlipidemia   . Hypertension   . Keloid of skin   . Obesity   . OSA (obstructive sleep apnea)   . Pancreatitis   .  Paroxysmal atrial fibrillation (HCC)   . 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 Renal oncocytoma    s/p right nephrectomy  . Rheumatoid arthritis ([place]) 05/29/2017    Past Surgical History:  Procedure Laterality Date  . BREAST BIOPSY  1996   benign lesion   . Cataract surgery Left 11/13/14  . CHOLECYSTECTOMY  10/2006  . COLONOSCOPY W/ BIOPSIES    . CORONARY ANGIOPLASTY    . ESOPHAGOGASTRODUODENOSCOPY    . NEPHRECTOMY  10/2006   right secondary to oncocytoma   . RIGHT FEMORAL  PSEUDOANEURYSM & RIGHT GROIN HEMATOMA EVACUATION  02/2007  . RIGHT RENAL ARTERY REPAIR  1976  . TUBAL LIGATION      Social History    Socioeconomic History  . Marital status: Married    Spouse name: Not on file  . Number of children: 3  . Years of education: Not on file  . Highest education level: Not on file  Occupational History  . Occupation: Retired    Fish farm manager: RETIRED    Comment: retired  Tobacco Use  . Smoking status: Never Smoker  . Smokeless tobacco: Never Used  Vaping Use  . Vaping Use: Never used  Substance and Sexual Activity  . Alcohol use: No  . Drug use: No  . Sexual activity: Never  Other Topics Concern  . 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 Difficulty of Paying Living Expenses: Not hard at all  Food Insecurity:   . Worried About Charity fundraiser in the Last Year: Not on file  . Ran Out of Food in the Last Year: Not on file  Transportation Needs: No Transportation Needs  . Lack of Transportation (Medical): No  . Lack of Transportation (Non-Medical): No  Physical Activity:   . Days of Exercise per Week: Not on file  . Minutes of Exercise per Session: Not on file  Stress:   . Feeling of Stress : Not on file  Social Connections:   . Frequency of Communication with Friends and Family: Not on file  . Frequency of Social Gatherings with Friends and Family: Not on file  . Attends Religious Services: Not on file  . Active Member of Clubs or Organizations: Not on file  . Attends Archivist Meetings: Not on file  . Marital Status: Not on file  Intimate Partner Violence:   . Fear of Current or Ex-Partner: Not on file  . Emotionally Abused: Not on file  . Physically Abused: Not on file  . Sexually Abused: Not on file    Family History  Problem Relation Age of Onset  . Colitis Father   . Heart disease Father   . Hypertension Mother   . Coronary artery disease  Mother   . Stroke Mother   . Clotting disorder Mother   . Heart disease Mother   . Allergies Mother   . Stroke Brother   . Colon cancer Paternal Aunt   . Clotting disorder Brother   . Clotting disorder Sister   . Asthma Daughter   . Asthma Maternal Uncle   . Colon polyps Neg Hx   . Kidney disease Neg Hx     ROS: no fevers or chills, productive cough, hemoptysis, dysphasia, odynophagia, melena,  hematochezia, dysuria, hematuria, rash, seizure activity, orthopnea, PND, pedal edema, claudication. Remaining systems are negative.  Physical Exam: Well-developed well-nourished in no acute distress.  Skin is warm and dry.  HEENT is normal.  Neck is supple.  Chest is clear to auscultation with normal expansion.  Cardiovascular exam is regular rate and rhythm.  Abdominal exam nontender or distended. No masses palpated. Extremities show no edema. neuro grossly intact  ECG- personally reviewed  A/P  1 paroxysmal atrial fibrillation-patient remains in sinus rhythm.  Continue Tikosyn and Coumadin at present dose.  Note LV function is normal.  2 coronary artery disease-she denies chest pain.  She is intolerant to statins.  She is not on aspirin given need for Coumadin.  3 hypertension-patient's blood pressure is controlled.  Continue present medical regimen and follow.  4 hyperlipidemia-intolerant to statins.  Has declined other lipid-lowering medications previously.  5 history of carotid disease-minimal on most recent Dopplers.  Kirk Ruths, MD

## 2020-10-05 ENCOUNTER — Ambulatory Visit: Payer: Medicare Other | Admitting: Cardiology

## 2020-10-12 DIAGNOSIS — M0589 Other rheumatoid arthritis with rheumatoid factor of multiple sites: Secondary | ICD-10-CM | POA: Diagnosis not present

## 2020-10-22 ENCOUNTER — Other Ambulatory Visit: Payer: Self-pay

## 2020-10-22 ENCOUNTER — Ambulatory Visit (INDEPENDENT_AMBULATORY_CARE_PROVIDER_SITE_OTHER): Payer: Medicare Other

## 2020-10-22 DIAGNOSIS — Z5181 Encounter for therapeutic drug level monitoring: Secondary | ICD-10-CM | POA: Diagnosis not present

## 2020-10-22 DIAGNOSIS — I4891 Unspecified atrial fibrillation: Secondary | ICD-10-CM

## 2020-10-22 LAB — POCT INR: INR: 2.3 (ref 2.0–3.0)

## 2020-10-22 NOTE — Patient Instructions (Signed)
Continue warfarin 1/2 tablet daily except 1 tablet each Monday and Friday.  Repeat INR in 8 weeks.   

## 2020-10-26 ENCOUNTER — Ambulatory Visit (HOSPITAL_BASED_OUTPATIENT_CLINIC_OR_DEPARTMENT_OTHER): Payer: Medicare Other

## 2020-11-05 ENCOUNTER — Other Ambulatory Visit (HOSPITAL_COMMUNITY): Payer: Self-pay

## 2020-11-05 ENCOUNTER — Other Ambulatory Visit: Payer: Self-pay | Admitting: Cardiology

## 2020-11-05 DIAGNOSIS — I480 Paroxysmal atrial fibrillation: Secondary | ICD-10-CM

## 2020-11-06 MED ORDER — METOPROLOL TARTRATE 25 MG PO TABS: 25.0000 mg | ORAL_TABLET | Freq: Two times a day (BID) | ORAL | 0 refills | Status: DC

## 2020-11-11 ENCOUNTER — Ambulatory Visit (HOSPITAL_BASED_OUTPATIENT_CLINIC_OR_DEPARTMENT_OTHER): Payer: Medicare Other

## 2020-11-13 ENCOUNTER — Other Ambulatory Visit (HOSPITAL_COMMUNITY): Payer: Self-pay | Admitting: Cardiology

## 2020-11-13 DIAGNOSIS — I480 Paroxysmal atrial fibrillation: Secondary | ICD-10-CM

## 2020-11-16 ENCOUNTER — Telehealth: Payer: Self-pay | Admitting: Cardiology

## 2020-11-16 ENCOUNTER — Other Ambulatory Visit (HOSPITAL_COMMUNITY): Payer: Self-pay

## 2020-11-16 DIAGNOSIS — I480 Paroxysmal atrial fibrillation: Secondary | ICD-10-CM

## 2020-11-16 MED ORDER — METOPROLOL TARTRATE 25 MG PO TABS: 25.0000 mg | ORAL_TABLET | Freq: Two times a day (BID) | ORAL | 3 refills | Status: DC

## 2020-11-16 NOTE — Telephone Encounter (Signed)
New Rx sent to the requested pharmacy. 

## 2020-11-16 NOTE — Telephone Encounter (Signed)
Pt c/o medication issue:  1. Name of Medication: metoprolol tartrate (LOPRESSOR) 25 MG tablet  2. How are you currently taking this medication (dosage and times per day)? As written  3. Are you having a reaction (difficulty breathing--STAT)? No   4. What is your medication issue? Patient needs a new prescription sent to [id number] - [place], [place] - [address] for a 90 day supply

## 2020-11-23 DIAGNOSIS — M0589 Other rheumatoid arthritis with rheumatoid factor of multiple sites: Secondary | ICD-10-CM | POA: Diagnosis not present

## 2020-11-24 DIAGNOSIS — L853 Xerosis cutis: Secondary | ICD-10-CM | POA: Diagnosis not present

## 2020-11-24 DIAGNOSIS — L918 Other hypertrophic disorders of the skin: Secondary | ICD-10-CM | POA: Diagnosis not present

## 2020-11-24 DIAGNOSIS — L409 Psoriasis, unspecified: Secondary | ICD-10-CM | POA: Diagnosis not present

## 2020-12-01 ENCOUNTER — Other Ambulatory Visit: Payer: Self-pay | Admitting: Cardiology

## 2020-12-15 ENCOUNTER — Other Ambulatory Visit: Payer: Self-pay

## 2020-12-15 ENCOUNTER — Ambulatory Visit (INDEPENDENT_AMBULATORY_CARE_PROVIDER_SITE_OTHER): Payer: Medicare Other

## 2020-12-15 DIAGNOSIS — I4891 Unspecified atrial fibrillation: Secondary | ICD-10-CM

## 2020-12-15 DIAGNOSIS — Z5181 Encounter for therapeutic drug level monitoring: Secondary | ICD-10-CM | POA: Diagnosis not present

## 2020-12-15 LAB — POCT INR: INR: 2 (ref 2.0–3.0)

## 2020-12-15 NOTE — Patient Instructions (Signed)
Continue warfarin 1/2 tablet daily except 1 tablet each Monday and Friday.  Repeat INR in 8 weeks.   

## 2021-01-04 DIAGNOSIS — M0589 Other rheumatoid arthritis with rheumatoid factor of multiple sites: Secondary | ICD-10-CM | POA: Diagnosis not present

## 2021-01-13 ENCOUNTER — Telehealth: Payer: Self-pay | Admitting: Cardiology

## 2021-01-13 NOTE — Telephone Encounter (Signed)
Spoke with patient and she has been having back pain between the shoulder blades and sometimes just shoulder Uncomfortable from front to back  Denies shortness of breath  Pain not worse with breathing, better when lays down at night Patient does have RA but has never had this type of pain before Patient requested follow up with Dr Stanford Breed to rule out heart Scheduled appointment for Friday and advised patient to go ahead and call rheumatologist for follow up as well

## 2021-01-13 NOTE — Telephone Encounter (Signed)
Message sent to scheduling pool, pt has appt with [name] on 02/15/21:  "Back pain between spine and shoulder blade. Have had sore tooth. Primary dentist sent me to specialist because of fear of infection in previously root canaled tooth with crown. Specialist says no infection. Am doing treatment for gingivitis. Is there possiblity of doing course of prednisolone because prednisone and most antibiotics trigger A-fib?"

## 2021-01-15 ENCOUNTER — Ambulatory Visit: Payer: Medicare Other | Admitting: Cardiology

## 2021-02-08 NOTE — Progress Notes (Signed)
HPI: FU coronary artery disease and atrial fibrillation. Last cardiac catheterization on Mar 06, 2007 showed an ejection fraction of 55%. There is nonobstructive plaque in the LAD, and circumflex and the right coronary artery was normal. 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 Also note the patient is extremely sensitive to Coumadin. MRA of the abdomen in September of 2012 showed no left renal artery stenosis and previous right nephrectomy. 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 Event monitor January 2018 showed sinus with PACs, PVCs and ectopic atrial tachycardia. Carotid dopplers 11/21 showed 1-39 bilateral stenosis. Echo 11/21 showed normal LV function, grade 2 DD, moderate pulmonary hypertension, moderate LAE, mild RAE.Since I last saw her, she denies increased dyspnea, exertional chest pain, palpitations or syncope.  Current Outpatient Medications  Medication Sig Dispense Refill  . acetaminophen (TYLENOL) 325 MG tablet Take 650 mg by mouth every 6 (six) hours as needed.    Marland Kitchen amLODipine (NORVASC) 10 MG tablet TAKE 1 TABLET BY MOUTH EVERY DAY 90 tablet 2  . dextrose 5 % SOLN 50 mL with methotrexate 25 MG/ML (PF) SOLN 40 mg/m2 Inject 40 mg/m2 into the vein once a week. Every wednesday    . dofetilide (TIKOSYN) 125 MCG capsule TAKE 3 CAPSULES BY MOUTH TWICE A DAY 540 capsule 1  . doxazosin (CARDURA) 2 MG tablet TAKE 1 TABLET BY MOUTH EVERY DAY 90 tablet 2  . escitalopram (LEXAPRO) 20 MG tablet TAKE 1 TABLET BY MOUTH EVERY DAY 30 tablet 0  . esomeprazole (NEXIUM) 40 MG capsule Take 1 capsule (40 mg total) by mouth daily. Take in the morning before breakfast. 30 capsule 11  . folic acid (FOLVITE) 1 MG tablet Take 2 mg by mouth daily.    . InFLIXimab (REMICADE IV) Inject into the vein as directed.    . loperamide (IMODIUM A-D) 2 MG tablet Take 1 mg by mouth as needed.  Dihrrhea    . metoprolol tartrate (LOPRESSOR) 25 MG tablet Take 1 tablet (25 mg total) by mouth in the morning and at bedtime. 180 tablet 3  . Multiple Vitamin (MULTIVITAMIN) capsule Take 1 capsule by mouth daily. 1 daily    . nitroGLYCERIN (NITROSTAT) 0.4 MG SL tablet Place 1 tablet (0.4 mg total) under the tongue every 5 (five) minutes as needed for chest pain. Do not exceed 3 tabs in 15 minutes 75 tablet 3  . Probiotic Product ([place]) CAPS Take 1 capsule by mouth daily. 1 per day    . valsartan (DIOVAN) 160 MG tablet TAKE 1 TABLET BY MOUTH EVERY DAY 90 tablet 1  . warfarin (COUMADIN) 2.5 MG tablet TAKE 1/2 TO 1 TABLET DAILY AS DIRECTED BY COUMADIN CLINIC 90 tablet 0   No current facility-administered medications for this visit.     Past Medical History:  Diagnosis Date  . Anxiety   . Arthritis   . CAD (coronary artery disease)   . Crohn's colitis ([place])    she reports ulcerative colitis beginning in her 70's, biopsies 2008 suggest Crohn's not UC  . Depression   . GERD (gastroesophageal reflux disease)   . Hyperlipidemia   . Hypertension   . Keloid of skin   . Obesity   . OSA (obstructive sleep apnea)   . Pancreatitis   . Paroxysmal atrial fibrillation (H[facility])   . 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  Renal oncocytoma    s/p right nephrectomy  . Rheumatoid arthritis ([place]) 05/29/2017    Past Surgical History:  Procedure Laterality Date  . BREAST BIOPSY  1996   benign lesion   . Cataract surgery Left 11/13/14  . CHOLECYSTECTOMY  10/2006  . COLONOSCOPY W/ BIOPSIES    . CORONARY ANGIOPLASTY    . ESOPHAGOGASTRODUODENOSCOPY    . NEPHRECTOMY  10/2006   right secondary to oncocytoma   . RIGHT FEMORAL  PSEUDOANEURYSM & RIGHT GROIN HEMATOMA EVACUATION  02/2007  . RIGHT RENAL ARTERY REPAIR  1976  . TUBAL LIGATION      Social History   Socioeconomic History  . Marital status: Married    Spouse name: Not on file  . Number of children: 3  . Years of  education: Not on file  . Highest education level: Not on file  Occupational History  . Occupation: Retired    Fish farm manager: RETIRED    Comment: retired  Tobacco Use  . Smoking status: Never Smoker  . Smokeless tobacco: Never Used  Vaping Use  . Vaping Use: Never used  Substance and Sexual Activity  . Alcohol use: No  . Drug use: No  . Sexual activity: Never  Other Topics Concern  . 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 Colitis Father   . Heart disease Father   . Hypertension Mother   . Coronary artery disease Mother   . Stroke Mother   . Clotting disorder Mother   . Heart disease Mother   . Allergies Mother   . Stroke Brother   . Colon cancer Paternal Aunt   . Clotting disorder Brother   . Clotting disorder Sister   . Asthma Daughter   . Asthma Maternal Uncle   . Colon polyps Neg Hx   . Kidney disease Neg Hx     ROS: no fevers or chills, productive cough, hemoptysis, dysphasia, odynophagia, melena, hematochezia, dysuria, hematuria, rash, seizure activity, orthopnea, PND, pedal edema, claudication. Remaining systems are negative.  Physical Exam: Well-developed obese in no acute distress.  Skin is warm and dry.  HEENT is normal.  Neck is supple.  Chest is clear to auscultation with normal expansion.  Cardiovascular exam is regular rate and rhythm.  Abdominal exam nontender or distended. No masses palpated. Extremities show no edema. neuro grossly intact  ECG-sinus bradycardia at a rate of 54, occasional PAC, no ST  changes.  Personally reviewed  A/P  1 PAF-pt remains in sinus; continue tikosyn and coumadin. Check renal function and mg.  2 CAD-statin intolerant; no ASA given need for coumadin.  3 hypertension-BP controlled; continue present meds.  4 hyperlipidemia-intolerant to statins. Previously declined other lipid lowering meds.  5 carotid artery disease-mild on most recent dopplers.   Kirk Ruths, MD

## 2021-02-11 ENCOUNTER — Other Ambulatory Visit: Payer: Self-pay | Admitting: Family Medicine

## 2021-02-15 ENCOUNTER — Other Ambulatory Visit: Payer: Self-pay

## 2021-02-15 ENCOUNTER — Ambulatory Visit (INDEPENDENT_AMBULATORY_CARE_PROVIDER_SITE_OTHER): Payer: Medicare Other

## 2021-02-15 ENCOUNTER — Encounter: Payer: Self-pay | Admitting: Cardiology

## 2021-02-15 ENCOUNTER — Ambulatory Visit (INDEPENDENT_AMBULATORY_CARE_PROVIDER_SITE_OTHER): Payer: Medicare Other | Admitting: Cardiology

## 2021-02-15 VITALS — BP 138/72 | HR 54 | Ht 63.0 in | Wt 244.8 lb

## 2021-02-15 DIAGNOSIS — I4891 Unspecified atrial fibrillation: Secondary | ICD-10-CM | POA: Diagnosis not present

## 2021-02-15 DIAGNOSIS — I10 Essential (primary) hypertension: Secondary | ICD-10-CM

## 2021-02-15 DIAGNOSIS — I480 Paroxysmal atrial fibrillation: Secondary | ICD-10-CM | POA: Diagnosis not present

## 2021-02-15 DIAGNOSIS — Z5181 Encounter for therapeutic drug level monitoring: Secondary | ICD-10-CM

## 2021-02-15 DIAGNOSIS — E7800 Pure hypercholesterolemia, unspecified: Secondary | ICD-10-CM | POA: Diagnosis not present

## 2021-02-15 DIAGNOSIS — I2510 Atherosclerotic heart disease of native coronary artery without angina pectoris: Secondary | ICD-10-CM | POA: Diagnosis not present

## 2021-02-15 LAB — POCT INR: INR: 2.4 (ref 2.0–3.0)

## 2021-02-15 NOTE — Patient Instructions (Signed)

## 2021-02-15 NOTE — Patient Instructions (Signed)
Continue warfarin 1/2 tablet daily except 1 tablet each Monday and Friday.  Repeat INR in 8 weeks.

## 2021-02-16 DIAGNOSIS — M0589 Other rheumatoid arthritis with rheumatoid factor of multiple sites: Secondary | ICD-10-CM | POA: Diagnosis not present

## 2021-02-16 LAB — BASIC METABOLIC PANEL
BUN/Creatinine Ratio: 13 (ref 12–28)
BUN: 14 mg/dL (ref 8–27)
CO2: 21 mmol/L (ref 20–29)
Calcium: 9.2 mg/dL (ref 8.7–10.3)
Chloride: 104 mmol/L (ref 96–106)
Creatinine, Ser: 1.1 mg/dL — ABNORMAL HIGH (ref 0.57–1.00)
Glucose: 98 mg/dL (ref 65–99)
Potassium: 4.5 mmol/L (ref 3.5–5.2)
Sodium: 142 mmol/L (ref 134–144)
eGFR: 52 mL/min/{1.73_m2} — ABNORMAL LOW (ref >59)

## 2021-02-16 LAB — MAGNESIUM: Magnesium: 2.1 mg/dL (ref 1.6–2.3)

## 2021-03-03 DIAGNOSIS — M65322 Trigger finger, left index finger: Secondary | ICD-10-CM | POA: Diagnosis not present

## 2021-03-03 DIAGNOSIS — Z85828 Personal history of other malignant neoplasm of skin: Secondary | ICD-10-CM | POA: Diagnosis not present

## 2021-03-03 DIAGNOSIS — M0589 Other rheumatoid arthritis with rheumatoid factor of multiple sites: Secondary | ICD-10-CM | POA: Diagnosis not present

## 2021-03-03 DIAGNOSIS — K5090 Crohn's disease, unspecified, without complications: Secondary | ICD-10-CM | POA: Diagnosis not present

## 2021-03-03 DIAGNOSIS — Z6841 Body Mass Index (BMI) 40.0 and over, adult: Secondary | ICD-10-CM | POA: Diagnosis not present

## 2021-03-03 DIAGNOSIS — Q600 Renal agenesis, unilateral: Secondary | ICD-10-CM | POA: Diagnosis not present

## 2021-03-03 DIAGNOSIS — H209 Unspecified iridocyclitis: Secondary | ICD-10-CM | POA: Diagnosis not present

## 2021-03-03 DIAGNOSIS — M545 Low back pain, unspecified: Secondary | ICD-10-CM | POA: Diagnosis not present

## 2021-03-04 ENCOUNTER — Other Ambulatory Visit: Payer: Self-pay | Admitting: Family Medicine

## 2021-03-04 ENCOUNTER — Other Ambulatory Visit: Payer: Self-pay | Admitting: Cardiology

## 2021-03-11 DIAGNOSIS — L570 Actinic keratosis: Secondary | ICD-10-CM | POA: Diagnosis not present

## 2021-03-30 ENCOUNTER — Other Ambulatory Visit: Payer: Self-pay | Admitting: Family Medicine

## 2021-04-03 ENCOUNTER — Other Ambulatory Visit: Payer: Self-pay | Admitting: Family Medicine

## 2021-04-05 DIAGNOSIS — M0589 Other rheumatoid arthritis with rheumatoid factor of multiple sites: Secondary | ICD-10-CM | POA: Diagnosis not present

## 2021-04-05 NOTE — Telephone Encounter (Signed)
I have called pt and scheduled her a follow up visit. Pt reports that she doesn't need any refills of the Lexapro at this time.

## 2021-04-13 ENCOUNTER — Ambulatory Visit (INDEPENDENT_AMBULATORY_CARE_PROVIDER_SITE_OTHER): Payer: Medicare Other

## 2021-04-13 ENCOUNTER — Other Ambulatory Visit: Payer: Self-pay

## 2021-04-13 DIAGNOSIS — Z5181 Encounter for therapeutic drug level monitoring: Secondary | ICD-10-CM | POA: Diagnosis not present

## 2021-04-13 DIAGNOSIS — I4891 Unspecified atrial fibrillation: Secondary | ICD-10-CM | POA: Diagnosis not present

## 2021-04-13 LAB — POCT INR: INR: 2.7 (ref 2.0–3.0)

## 2021-04-13 NOTE — Patient Instructions (Signed)
Continue warfarin 1/2 tablet daily except 1 tablet each Monday and Friday.  Repeat INR in 8 weeks.

## 2021-04-26 DIAGNOSIS — Z20822 Contact with and (suspected) exposure to covid-19: Secondary | ICD-10-CM | POA: Diagnosis not present

## 2021-05-17 DIAGNOSIS — Z79899 Other long term (current) drug therapy: Secondary | ICD-10-CM | POA: Diagnosis not present

## 2021-05-17 DIAGNOSIS — R5383 Other fatigue: Secondary | ICD-10-CM | POA: Diagnosis not present

## 2021-05-17 DIAGNOSIS — M0589 Other rheumatoid arthritis with rheumatoid factor of multiple sites: Secondary | ICD-10-CM | POA: Diagnosis not present

## 2021-05-17 DIAGNOSIS — Z111 Encounter for screening for respiratory tuberculosis: Secondary | ICD-10-CM | POA: Diagnosis not present

## 2021-05-24 ENCOUNTER — Ambulatory Visit (INDEPENDENT_AMBULATORY_CARE_PROVIDER_SITE_OTHER): Payer: Medicare Other

## 2021-05-24 VITALS — Ht 63.0 in | Wt 240.0 lb

## 2021-05-24 DIAGNOSIS — Z0000 Encounter for general adult medical examination without abnormal findings: Secondary | ICD-10-CM

## 2021-05-24 NOTE — Progress Notes (Signed)
Subjective:   [name] is a 75 y.o. female who presents for Medicare Annual (Subsequent) preventive examination.  I connected with Yoko today by telephone and verified that I am speaking with the correct person using two identifiers. Location patient: home Location provider: work Persons participating in the virtual visit: patient, Marine scientist.    I discussed the limitations, risks, security and privacy concerns of performing an evaluation and management service by telephone and the availability of in person appointments. I also discussed with the patient that there may be a patient responsible charge related to this service. The patient expressed understanding and verbally consented to this telephonic visit.    Interactive audio and video telecommunications were attempted between this provider and patient, however failed, due to patient having technical difficulties OR patient did not have access to video capability.  We continued and completed visit with audio only.  Some vital signs may be absent or patient reported.   Time Spent with patient on telephone encounter: 20 minutes   Review of Systems     Cardiac Risk Factors include: advanced age (>63mn, >>49women);hypertension;dyslipidemia;obesity (BMI >30kg/m2);sedentary lifestyle     Objective:    Today's Vitals   05/24/21 1317  Weight: 240 lb (108.9 kg)  Height: '5\' 3"'$  (1.6 m)   Body mass index is 42.51 kg/m.  Advanced Directives 05/24/2021 12/06/2019 12/03/2018 11/18/2018 11/30/2017 12/14/2015 08/28/2014  Does Patient Have a Medical Advance Directive? No No No No No No No  Does patient want to make changes to medical advance directive? No - Patient declined - - - - - -  Would patient like information on creating a medical advance directive? - No - Patient declined Yes (MAU/Ambulatory/Procedural Areas - Information given) No - Patient declined No - Patient declined No - patient declined information Yes - Educational materials  given    Current Medications (verified) Outpatient Encounter Medications as of 05/24/2021  Medication Sig   acetaminophen (TYLENOL) 325 MG tablet Take 650 mg by mouth every 6 (six) hours as needed.   amLODipine (NORVASC) 10 MG tablet TAKE 1 TABLET BY MOUTH EVERY DAY   dextrose 5 % SOLN 50 mL with methotrexate 25 MG/ML (PF) SOLN 40 mg/m2 Inject 40 mg/m2 into the vein once a week. Every wednesday   dofetilide (TIKOSYN) 125 MCG capsule TAKE 3 CAPSULES BY MOUTH TWICE A DAY   doxazosin (CARDURA) 2 MG tablet TAKE 1 TABLET BY MOUTH EVERY DAY   escitalopram (LEXAPRO) 20 MG tablet TAKE 1 TABLET BY MOUTH EVERY DAY   esomeprazole (NEXIUM) 40 MG capsule Take 1 capsule (40 mg total) by mouth daily. Take in the morning before breakfast.   folic acid (FOLVITE) 1 MG tablet Take 2 mg by mouth daily.   InFLIXimab (REMICADE IV) Inject into the vein as directed.   loperamide (IMODIUM A-D) 2 MG tablet Take 1 mg by mouth as needed. Dihrrhea   metoprolol tartrate (LOPRESSOR) 25 MG tablet Take 1 tablet (25 mg total) by mouth in the morning and at bedtime.   Multiple Vitamin (MULTIVITAMIN) capsule Take 1 capsule by mouth daily. 1 daily   nitroGLYCERIN (NITROSTAT) 0.4 MG SL tablet Place 1 tablet (0.4 mg total) under the tongue every 5 (five) minutes as needed for chest pain. Do not exceed 3 tabs in 15 minutes   Probiotic Product ([place] CAPS Take 1 capsule by mouth daily. 1 per day   valsartan (DIOVAN) 160 MG tablet TAKE 1 TABLET BY MOUTH EVERY DAY   warfarin (  COUMADIN) 2.5 MG tablet TAKE 1/2 TO 1 TABLET DAILY AS DIRECTED BY COUMADIN CLINIC   No facility-administered encounter medications on file as of 05/24/2021.    Allergies (verified) Hydralazine, Cefdinir, Codeine, Guaifenesin er, Latex, Mucilgen [psyllium], and Percocet [oxycodone-acetaminophen]   History: Past Medical History:  Diagnosis Date   Anxiety    Arthritis    CAD (coronary artery disease)    Crohn's colitis ([facility])    she reports  ulcerative colitis beginning in her 39's, biopsies 2008 suggest Crohn's not UC   Depression    GERD (gastroesophageal reflux disease)    Hyperlipidemia    Hypertension    Keloid of skin    Obesity    OSA (obstructive sleep apnea)    Pancreatitis    Paroxysmal atrial fibrillation (HCC)    Pseudoaneurysm of right femoral artery (HCC)    Renal oncocytoma    s/p right nephrectomy   Rheumatoid arthritis ([facility]) 05/29/2017   Past Surgical History:  Procedure Laterality Date   BREAST BIOPSY  1996   benign lesion    Cataract surgery Left 11/13/14   CHOLECYSTECTOMY  10/2006   COLONOSCOPY W/ BIOPSIES     CORONARY ANGIOPLASTY     ESOPHAGOGASTRODUODENOSCOPY     NEPHRECTOMY  10/2006   right secondary to oncocytoma    RIGHT FEMORAL  PSEUDOANEURYSM & RIGHT GROIN HEMATOMA EVACUATION  02/2007   RIGHT RENAL ARTERY REPAIR  1976   TUBAL LIGATION     Family History  Problem Relation Age of Onset   Colitis Father    Heart disease Father    Hypertension Mother    Coronary artery disease Mother    Stroke Mother    Clotting disorder Mother    Heart disease Mother    Allergies Mother    Stroke Brother    Colon cancer Paternal Aunt    Clotting disorder Brother    Clotting disorder Sister    Asthma Daughter    Asthma Maternal Uncle    Colon polyps Neg Hx    Kidney disease Neg Hx    Social History   Socioeconomic History   Marital status: Married    Spouse name: Not on file   Number of children: 3   Years of education: Not on file   Highest education level: Not on file  Occupational History   Occupation: Retired    Fish farm manager: RETIRED    Comment: retired  Tobacco Use   Smoking status: Never   Smokeless tobacco: Never  Scientific laboratory technician Use: Never used  Substance and Sexual Activity   Alcohol use: No   Drug use: No   Sexual activity: Never  Other Topics Concern   Not on file  Social History Narrative   Married since 1965    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, Pensions consultant)   3 children - adults, 2 grandchildren   No pets   Social Determinants of Radio broadcast assistant Strain: Low Risk    Difficulty of Paying Living Expenses: Not hard at all  Food Insecurity: No Food Insecurity   Worried About Charity fundraiser in the Last Year: Never true   Arboriculturist in the Last Year: Never true  Transportation Needs: No Transportation Needs   Lack of Transportation (Medical): No   Lack of Transportation (Non-Medical): No  Physical Activity: Inactive   Days of Exercise per Week: 0 days   Minutes of Exercise per Session: 0 min  Stress: No Stress  Concern Present   Feeling of Stress : Not at all  Social Connections: Moderately Isolated   Frequency of Communication with Friends and Family: More than three times a week   Frequency of Social Gatherings with Friends and Family: More than three times a week   Attends Religious Services: Never   Marine scientist or Organizations: No   Attends Music therapist: Never   Marital Status: Married    Tobacco Counseling Counseling given: Not Answered   Clinical Intake:  Pre-visit preparation completed: Yes  Pain : No/denies pain     Nutritional Status: BMI > 30  Obese Nutritional Risks: None Diabetes: No  How often do you need to have someone help you when you read instructions, pamphlets, or other written materials from your doctor or pharmacy?: 1 - Never  Diabetic?No  Interpreter Needed?: No  Information entered by :: Caroleen Hamman LPN   Activities of Daily Living In your present state of health, do you have any difficulty performing the following activities: 05/24/2021  Hearing? N  Vision? N  Difficulty concentrating or making decisions? N  Walking or climbing stairs? N  Dressing or bathing? N  Doing errands, shopping? N  Preparing Food and eating ? N  Using the Toilet? N  In the past six months, have you accidently leaked urine? Y  Do you have problems with  loss of bowel control? N  Managing your Medications? N  Managing your Finances? N  Housekeeping or managing your Housekeeping? N  Some recent data might be hidden    Patient Care Team: [name], MD as PCP - General (Family Medicine) Stanford Breed Denice Bors, MD as PCP - Cardiology (Cardiology) Gatha Mayer, MD as Consulting Physician (Gastroenterology) Rigoberto Noel, MD as Consulting Physician (Pulmonary Disease) Calvert Cantor, MD as Consulting Physician (Ophthalmology) Hennie Duos, MD as Consulting Physician (Rheumatology) [place], Jennefer Bravo, MD as Referring Physician (Dermatology)  Indicate any recent Medical Services you may have received from other than [facility] providers in the past year (date may be approximate).     Assessment:   This is a routine wellness examination for [name].  Hearing/Vision screen Hearing Screening - Comments:: Bilateral hearing aids Vision Screening - Comments:: Wears glasses Last eye exam-2-3 years ago-Dr. Lorene Dy  Dietary issues and exercise activities discussed: Current Exercise Habits: The patient does not participate in regular exercise at present, Exercise limited by: orthopedic condition(s) (rheumatoid arthritis)   Goals Addressed             This Visit's Progress    Patient Stated       Continue to drink plenty of water       Depression Screen PHQ 2/9 Scores 05/24/2021 12/06/2019 12/03/2018 11/30/2017 05/29/2017 12/14/2015 08/31/2015  PHQ - 2 Score 0 0 1 1 0 0 4  PHQ- 9 Score - - - - - - 8    Fall Risk Fall Risk  05/24/2021 12/06/2019 12/03/2018 11/30/2017 05/29/2017  Falls in the past year? 0 0 1 No No  Number falls in past yr: 0 0 0 - -  Injury with Fall? 0 0 0 - -  Follow up Falls prevention discussed Education provided;Falls prevention discussed - - -    FALL RISK PREVENTION PERTAINING TO THE HOME:  Any stairs in or around the home? Yes  If so, are there any without handrails? No  Home free of loose throw rugs in walkways,  pet beds, electrical cords, etc? Yes  Adequate lighting  in your home to reduce risk of falls? Yes   ASSISTIVE DEVICES UTILIZED TO PREVENT FALLS:  Life alert? No  Use of a cane, walker or w/c? No  Grab bars in the bathroom? Yes  Shower chair or bench in shower? No  Elevated toilet seat or a handicapped toilet? No   TIMED UP AND GO:  Was the test performed? No . Phone visit   Cognitive Function:Normal cognitive status assessed by  this Nurse Health Advisor. No abnormalities found.   MMSE - Mini Mental State Exam 12/03/2018 11/30/2017 12/14/2015  Orientation to time '5 5 5  '$ Orientation to Place '5 5 5  '$ Registration '3 3 3  '$ Attention/ Calculation '5 5 5  '$ Recall '3 3 3  '$ Language- name 2 objects '2 2 2  '$ Language- repeat '1 1 1  '$ Language- follow 3 step command '3 3 3  '$ Language- read & follow direction '1 1 1  '$ Write a sentence '1 1 1  '$ Copy design '1 1 1  '$ Total score '30 30 30        '$ Immunizations Immunization History  Administered Date(s) Administered   Fluad Quad(high Dose 65+) 09/12/2019   Influenza Split 07/23/2012   Influenza Whole 09/06/2007, 08/11/2008, 08/24/2009, 08/03/2010   Influenza, High Dose Seasonal PF 08/02/2013, 06/29/2016, 07/06/2017, 07/31/2018   Influenza,inj,Quad PF,6+ Mos 06/27/2014, 06/26/2015   Moderna Sars-Covid-2 Vaccination 12/25/2019, 01/22/2020   Pneumococcal Conjugate-13 08/28/2014   Pneumococcal Polysaccharide-23 09/13/2010   Td 05/12/2009   Zoster, Live 02/11/2008    TDAP status: Due, Education has been provided regarding the importance of this vaccine. Advised may receive this vaccine at local pharmacy or Health Dept. Aware to provide a copy of the vaccination record if obtained from local pharmacy or Health Dept. Verbalized acceptance and understanding.  Flu Vaccine status: Up to date  Pneumococcal vaccine status: Up to date  Covid-19 vaccine status: Information provided on how to obtain vaccines. Booster due  Qualifies for Shingles Vaccine? Yes    Zostavax completed Yes   Shingrix Completed?: No.    Education has been provided regarding the importance of this vaccine. Patient has been advised to call insurance company to determine out of pocket expense if they have not yet received this vaccine. Advised may also receive vaccine at local pharmacy or Health Dept. Verbalized acceptance and understanding.  Screening Tests Health Maintenance  Topic Date Due   FOOT EXAM  Never done   OPHTHALMOLOGY EXAM  Never done   Zoster Vaccines- Shingrix (1 of 2) Never done   COLONOSCOPY (Pts 45-65yr Insurance coverage will need to be confirmed)  09/10/2014   TETANUS/TDAP  05/13/2019   COVID-19 Vaccine (3 - Moderna risk series) 02/19/2020   HEMOGLOBIN A1C  05/13/2020   INFLUENZA VACCINE  05/24/2021   DEXA SCAN  Completed   Hepatitis C Screening  Completed   PNA vac Low Risk Adult  Completed   HPV VACCINES  Aged Out    Health Maintenance  Health Maintenance Due  Topic Date Due   FOOT EXAM  Never done   OPHTHALMOLOGY EXAM  Never done   Zoster Vaccines- Shingrix (1 of 2) Never done   COLONOSCOPY (Pts 45-460yrInsurance coverage will need to be confirmed)  09/10/2014   TETANUS/TDAP  05/13/2019   COVID-19 Vaccine (3 - Moderna risk series) 02/19/2020   HEMOGLOBIN A1C  05/13/2020   INFLUENZA VACCINE  05/24/2021    Colorectal cancer screening: Declined  Mammogram status: Declined today-patient states she will discuss with PCP at next office visit.  Bone Density status:Declined today-patient states she will discuss with PCP at next office visit.  Lung Cancer Screening: (Low Dose CT Chest recommended if Age 80-80 years, 30 pack-year currently smoking OR have quit w/in 15years.) does not qualify.     Additional Screening:  Hepatitis C Screening: Completed 06/17/2014  Vision Screening: Recommended annual ophthalmology exams for early detection of glaucoma and other disorders of the eye. Is the patient up to date with their annual eye exam?   No  Who is the provider or what is the name of the office in which the patient attends annual eye exams? Dr. Phineas Real  Dental Screening: Recommended annual dental exams for proper oral hygiene  Community Resource Referral / Chronic Care Management: CRR required this visit?  No   CCM required this visit?  No      Plan:     I have personally reviewed and noted the following in the patient's chart:   Medical and social history Use of alcohol, tobacco or illicit drugs  Current medications and supplements including opioid prescriptions.  Functional ability and status Nutritional status Physical activity Advanced directives List of other physicians Hospitalizations, surgeries, and ER visits in previous 12 months Vitals Screenings to include cognitive, depression, and falls Referrals and appointments  In addition, I have reviewed and discussed with patient certain preventive protocols, quality metrics, and best practice recommendations. A written personalized care plan for preventive services as well as general preventive health recommendations were provided to patient.   Due to this being a telephonic visit, the after visit summary with patients personalized plan was offered to patient via mail or my-chart. Patient would like to access on my-chart.   Marta Antu, LPN   [id number]  Nurse Health Advisor  Nurse Notes: None

## 2021-05-24 NOTE — Patient Instructions (Signed)
[name] , Thank you for taking time to complete your Medicare Wellness Visit. I appreciate your ongoing commitment to your health goals. Please review the following plan we discussed and let me know if I can assist you in the future.   Screening recommendations/referrals: Colonoscopy: Declined today. Mammogram: Declined today Bone Density: Declined today Recommended yearly ophthalmology/optometry visit for glaucoma screening and checkup Recommended yearly dental visit for hygiene and checkup  Vaccinations: Influenza vaccine: Up to date Pneumococcal vaccine: Up to date Tdap vaccine: Discuss with pharmacy Shingles vaccine: Discuss with pharmacy   Covid-19:Declined Booster  Advanced directives: Per our conversation, you have forms at home.  Conditions/risks identified: See problem list  Next appointment: Follow up in one year for your annual wellness visit 05/26/2021 @ 1:40   Preventive Care 65 Years and Older, Female Preventive care refers to lifestyle choices and visits with your health care provider that can promote health and wellness. What does preventive care include? A yearly physical exam. This is also called an annual well check. Dental exams once or twice a year. Routine eye exams. Ask your health care provider how often you should have your eyes checked. Personal lifestyle choices, including: Daily care of your teeth and gums. Regular physical activity. Eating a healthy diet. Avoiding tobacco and drug use. Limiting alcohol use. Practicing safe sex. Taking low-dose aspirin every day. Taking vitamin and mineral supplements as recommended by your health care provider. What happens during an annual well check? The services and screenings done by your health care provider during your annual well check will depend on your age, overall health, lifestyle risk factors, and family history of disease. Counseling  Your health care provider may ask you questions about  your: Alcohol use. Tobacco use. Drug use. Emotional well-being. Home and relationship well-being. Sexual activity. Eating habits. History of falls. Memory and ability to understand (cognition). Work and work Statistician. Reproductive health. Screening  You may have the following tests or measurements: Height, weight, and BMI. Blood pressure. Lipid and cholesterol levels. These may be checked every 5 years, or more frequently if you are over 76 years old. Skin check. Lung cancer screening. You may have this screening every year starting at age 76 if you have a 30-pack-year history of smoking and currently smoke or have quit within the past 15 years. Fecal occult blood test (FOBT) of the stool. You may have this test every year starting at age 76. Flexible sigmoidoscopy or colonoscopy. You may have a sigmoidoscopy every 5 years or a colonoscopy every 10 years starting at age 76. Hepatitis C blood test. Hepatitis B blood test. Sexually transmitted disease (STD) testing. Diabetes screening. This is done by checking your blood sugar (glucose) after you have not eaten for a while (fasting). You may have this done every 1-3 years. Bone density scan. This is done to screen for osteoporosis. You may have this done starting at age 76. Mammogram. This may be done every 1-2 years. Talk to your health care provider about how often you should have regular mammograms. Talk with your health care provider about your test results, treatment options, and if necessary, the need for more tests. Vaccines  Your health care provider may recommend certain vaccines, such as: Influenza vaccine. This is recommended every year. Tetanus, diphtheria, and acellular pertussis (Tdap, Td) vaccine. You may need a Td booster every 10 years. Zoster vaccine. You may need this after age 76. Pneumococcal 13-valent conjugate (PCV13) vaccine. One dose is recommended after age 76. Pneumococcal  polysaccharide (PPSV23) vaccine.  One dose is recommended after age 76. Talk to your health care provider about which screenings and vaccines you need and how often you need them. This information is not intended to replace advice given to you by your health care provider. Make sure you discuss any questions you have with your health care provider. Document Released: 11/06/2015 Document Revised: 06/29/2016 Document Reviewed: 08/11/2015 Elsevier Interactive Patient Education  2017 [place] Prevention in the Home Falls can cause injuries. They can happen to people of all ages. There are many things you can do to make your home safe and to help prevent falls. What can I do on the outside of my home? Regularly fix the edges of walkways and driveways and fix any cracks. Remove anything that might make you trip as you walk through a door, such as a raised step or threshold. Trim any bushes or trees on the path to your home. Use bright outdoor lighting. Clear any walking paths of anything that might make someone trip, such as rocks or tools. Regularly check to see if handrails are loose or broken. Make sure that both sides of any steps have handrails. Any raised decks and porches should have guardrails on the edges. Have any leaves, snow, or ice cleared regularly. Use sand or salt on walking paths during winter. Clean up any spills in your garage right away. This includes oil or grease spills. What can I do in the bathroom? Use night lights. Install grab bars by the toilet and in the tub and shower. Do not use towel bars as grab bars. Use non-skid mats or decals in the tub or shower. If you need to sit down in the shower, use a plastic, non-slip stool. Keep the floor dry. Clean up any water that spills on the floor as soon as it happens. Remove soap buildup in the tub or shower regularly. Attach bath mats securely with double-sided non-slip rug tape. Do not have throw rugs and other things on the floor that can make  you trip. What can I do in the bedroom? Use night lights. Make sure that you have a light by your bed that is easy to reach. Do not use any sheets or blankets that are too big for your bed. They should not hang down onto the floor. Have a firm chair that has side arms. You can use this for support while you get dressed. Do not have throw rugs and other things on the floor that can make you trip. What can I do in the kitchen? Clean up any spills right away. Avoid walking on wet floors. Keep items that you use a lot in easy-to-reach places. If you need to reach something above you, use a strong step stool that has a grab bar. Keep electrical cords out of the way. Do not use floor polish or wax that makes floors slippery. If you must use wax, use non-skid floor wax. Do not have throw rugs and other things on the floor that can make you trip. What can I do with my stairs? Do not leave any items on the stairs. Make sure that there are handrails on both sides of the stairs and use them. Fix handrails that are broken or loose. Make sure that handrails are as long as the stairways. Check any carpeting to make sure that it is firmly attached to the stairs. Fix any carpet that is loose or worn. Avoid having throw rugs at the top  or bottom of the stairs. If you do have throw rugs, attach them to the floor with carpet tape. Make sure that you have a light switch at the top of the stairs and the bottom of the stairs. If you do not have them, ask someone to add them for you. What else can I do to help prevent falls? Wear shoes that: Do not have high heels. Have rubber bottoms. Are comfortable and fit you well. Are closed at the toe. Do not wear sandals. If you use a stepladder: Make sure that it is fully opened. Do not climb a closed stepladder. Make sure that both sides of the stepladder are locked into place. Ask someone to hold it for you, if possible. Clearly mark and make sure that you can  see: Any grab bars or handrails. First and last steps. Where the edge of each step is. Use tools that help you move around (mobility aids) if they are needed. These include: Canes. Walkers. Scooters. Crutches. Turn on the lights when you go into a dark area. Replace any light bulbs as soon as they burn out. Set up your furniture so you have a clear path. Avoid moving your furniture around. If any of your floors are uneven, fix them. If there are any pets around you, be aware of where they are. Review your medicines with your doctor. Some medicines can make you feel dizzy. This can increase your chance of falling. Ask your doctor what other things that you can do to help prevent falls. This information is not intended to replace advice given to you by your health care provider. Make sure you discuss any questions you have with your health care provider. Document Released: 08/06/2009 Document Revised: 03/17/2016 Document Reviewed: 11/14/2014 Elsevier Interactive Patient Education  2017 [employer].

## 2021-05-29 ENCOUNTER — Other Ambulatory Visit: Payer: Self-pay | Admitting: Family Medicine

## 2021-05-29 ENCOUNTER — Other Ambulatory Visit: Payer: Self-pay | Admitting: Cardiology

## 2021-06-08 ENCOUNTER — Ambulatory Visit (INDEPENDENT_AMBULATORY_CARE_PROVIDER_SITE_OTHER): Payer: Medicare Other

## 2021-06-08 ENCOUNTER — Other Ambulatory Visit: Payer: Self-pay

## 2021-06-08 DIAGNOSIS — Z5181 Encounter for therapeutic drug level monitoring: Secondary | ICD-10-CM | POA: Diagnosis not present

## 2021-06-08 DIAGNOSIS — I4891 Unspecified atrial fibrillation: Secondary | ICD-10-CM | POA: Diagnosis not present

## 2021-06-08 LAB — POCT INR: INR: 2.4 (ref 2.0–3.0)

## 2021-06-08 NOTE — Patient Instructions (Signed)
Description   Continue warfarin 1/2 tablet daily except 1 tablet each Monday and Friday.  Repeat INR in 8 weeks.

## 2021-06-17 ENCOUNTER — Other Ambulatory Visit: Payer: Self-pay

## 2021-06-17 ENCOUNTER — Telehealth: Payer: Self-pay | Admitting: Family Medicine

## 2021-06-17 DIAGNOSIS — Z780 Asymptomatic menopausal state: Secondary | ICD-10-CM

## 2021-06-17 NOTE — Telephone Encounter (Signed)
Order placed

## 2021-06-17 NOTE — Telephone Encounter (Signed)
Pt. Had appoint with [facility] 8/1 and it was suggested to get a bone density scan. Pt. Wants to know will an order just be placed or does she need to come in for that to be placed?

## 2021-06-25 ENCOUNTER — Other Ambulatory Visit: Payer: Self-pay | Admitting: Family Medicine

## 2021-06-25 NOTE — Telephone Encounter (Signed)
Okay to fill? There is a drug interaction with another medication pt is taking. She is scheduled to f/u on 08/03/21

## 2021-06-29 DIAGNOSIS — M0589 Other rheumatoid arthritis with rheumatoid factor of multiple sites: Secondary | ICD-10-CM | POA: Diagnosis not present

## 2021-07-26 ENCOUNTER — Ambulatory Visit (HOSPITAL_BASED_OUTPATIENT_CLINIC_OR_DEPARTMENT_OTHER)
Admission: RE | Admit: 2021-07-26 | Discharge: 2021-07-26 | Disposition: A | Discharge status: Home / Self Care | Payer: Medicare Other | Source: Ambulatory Visit | Attending: Family Medicine | Admitting: Family Medicine

## 2021-07-26 ENCOUNTER — Encounter (HOSPITAL_BASED_OUTPATIENT_CLINIC_OR_DEPARTMENT_OTHER): Payer: Self-pay

## 2021-07-26 ENCOUNTER — Other Ambulatory Visit: Payer: Self-pay

## 2021-07-26 DIAGNOSIS — Z780 Asymptomatic menopausal state: Secondary | ICD-10-CM | POA: Diagnosis not present

## 2021-07-26 DIAGNOSIS — Z1231 Encounter for screening mammogram for malignant neoplasm of breast: Secondary | ICD-10-CM

## 2021-07-26 DIAGNOSIS — M810 Age-related osteoporosis without current pathological fracture: Secondary | ICD-10-CM | POA: Diagnosis not present

## 2021-07-26 DIAGNOSIS — M85832 Other specified disorders of bone density and structure, left forearm: Secondary | ICD-10-CM | POA: Diagnosis not present

## 2021-08-03 ENCOUNTER — Encounter: Payer: Self-pay | Admitting: Family Medicine

## 2021-08-03 ENCOUNTER — Ambulatory Visit (INDEPENDENT_AMBULATORY_CARE_PROVIDER_SITE_OTHER): Payer: Medicare Other

## 2021-08-03 ENCOUNTER — Other Ambulatory Visit: Payer: Self-pay

## 2021-08-03 ENCOUNTER — Ambulatory Visit (INDEPENDENT_AMBULATORY_CARE_PROVIDER_SITE_OTHER): Payer: Medicare Other | Admitting: Family Medicine

## 2021-08-03 VITALS — BP 114/60 | HR 66 | Temp 97.6°F | Resp 18 | Wt 247.2 lb

## 2021-08-03 DIAGNOSIS — R739 Hyperglycemia, unspecified: Secondary | ICD-10-CM

## 2021-08-03 DIAGNOSIS — I4891 Unspecified atrial fibrillation: Secondary | ICD-10-CM | POA: Diagnosis not present

## 2021-08-03 DIAGNOSIS — L409 Psoriasis, unspecified: Secondary | ICD-10-CM | POA: Diagnosis not present

## 2021-08-03 DIAGNOSIS — I2510 Atherosclerotic heart disease of native coronary artery without angina pectoris: Secondary | ICD-10-CM

## 2021-08-03 DIAGNOSIS — E559 Vitamin D deficiency, unspecified: Secondary | ICD-10-CM | POA: Diagnosis not present

## 2021-08-03 DIAGNOSIS — I10 Essential (primary) hypertension: Secondary | ICD-10-CM

## 2021-08-03 DIAGNOSIS — Z5181 Encounter for therapeutic drug level monitoring: Secondary | ICD-10-CM

## 2021-08-03 DIAGNOSIS — L600 Ingrowing nail: Secondary | ICD-10-CM

## 2021-08-03 DIAGNOSIS — Z789 Other specified health status: Secondary | ICD-10-CM

## 2021-08-03 DIAGNOSIS — K5010 Crohn's disease of large intestine without complications: Secondary | ICD-10-CM

## 2021-08-03 DIAGNOSIS — Z23 Encounter for immunization: Secondary | ICD-10-CM

## 2021-08-03 DIAGNOSIS — M810 Age-related osteoporosis without current pathological fracture: Secondary | ICD-10-CM

## 2021-08-03 DIAGNOSIS — E7800 Pure hypercholesterolemia, unspecified: Secondary | ICD-10-CM

## 2021-08-03 LAB — POCT INR: INR: 1.9 — AB (ref 2.0–3.0)

## 2021-08-03 NOTE — Patient Instructions (Signed)
Description   Take 1 tablet today and then continue warfarin 1/2 tablet daily except 1 tablet each Monday and Friday. Repeat INR in 8 weeks.

## 2021-08-03 NOTE — Progress Notes (Signed)
Patient ID: [name], female    DOB: April 04, 1946  Age: 75 y.o. MRN: [id number]    Subjective:   Chief Complaint  Patient presents with   Follow-up   Subjective   HPI [name] presents for office visit today for follow up on HTN and osteoporosis. She is currently not tolerating fosamax due to GI issues. She reports side effects of diarrhea and gas. She was taking it BID. She endorses having cheese in her diet. Denies CP/palp/SOB/HA/congestion/fevers/GI or GU c/o. Taking meds as prescribed. She reports that her last episode of palpitations was September of last year 2021. She has an ingrown toenail of 1st right toe that is developing. She is taking methotrexate for psoriasis.   Review of Systems  Constitutional:  Negative for chills, fatigue and fever.  HENT:  Negative for congestion, rhinorrhea, sinus pressure, sinus pain and sore throat.   Eyes:  Negative for pain.  Respiratory:  Negative for cough and shortness of breath.   Cardiovascular:  Negative for chest pain, palpitations and leg swelling.  Gastrointestinal:  Negative for abdominal pain, blood in stool, diarrhea, nausea and vomiting.  Genitourinary:  Negative for decreased urine volume, flank pain, frequency, vaginal bleeding and vaginal discharge.  Musculoskeletal:  Negative for back pain.  Neurological:  Negative for headaches.   History Past Medical History:  Diagnosis Date   Anxiety    Arthritis    CAD (coronary artery disease)    Crohn's colitis ([place])    she reports ulcerative colitis beginning in her 54's, biopsies 2008 suggest Crohn's not UC   Depression    GERD (gastroesophageal reflux disease)    Hyperlipidemia    Hypertension    Keloid of skin    Obesity    OSA (obstructive sleep apnea)    Pancreatitis    Paroxysmal atrial fibrillation (HCC)    Pseudoaneurysm of right femoral artery (HCC)    Renal oncocytoma    s/p right nephrectomy   Rheumatoid arthritis ([place]) 05/29/2017    She has a  past surgical history that includes Nephrectomy (10/2006); Breast biopsy (1996); Cholecystectomy (10/2006); Tubal ligation; RIGHT RENAL ARTERY REPAIR (1976); RIGHT FEMORAL  PSEUDOANEURYSM & RIGHT GROIN HEMATOMA EVACUATION (02/2007); Colonoscopy w/ biopsies; Esophagogastroduodenoscopy; Coronary angioplasty; and Cataract surgery (Left, 11/13/14).   Her family history includes Allergies in her mother; Asthma in her daughter and maternal uncle; Clotting disorder in her brother, mother, and sister; Colitis in her father; Colon cancer in her paternal aunt; Coronary artery disease in her mother; Heart disease in her father and mother; Hypertension in her mother; Stroke in her brother and mother.She reports that she has never smoked. She has never used smokeless tobacco. She reports that she does not drink alcohol and does not use drugs.  Current Outpatient Medications on File Prior to Visit  Medication Sig Dispense Refill   acetaminophen (TYLENOL) 325 MG tablet Take 650 mg by mouth every 6 (six) hours as needed.     amLODipine (NORVASC) 10 MG tablet TAKE 1 TABLET BY MOUTH EVERY DAY 90 tablet 2   dextrose 5 % SOLN 50 mL with methotrexate 25 MG/ML (PF) SOLN 40 mg/m2 Inject 40 mg/m2 into the vein once a week. Every wednesday     dofetilide (TIKOSYN) 125 MCG capsule TAKE 3 CAPSULES BY MOUTH TWICE A DAY 540 capsule 1   doxazosin (CARDURA) 2 MG tablet TAKE 1 TABLET BY MOUTH EVERY DAY 90 tablet 2   escitalopram (LEXAPRO) 20 MG tablet TAKE 1 TABLET BY MOUTH EVERY  DAY 90 tablet 2   esomeprazole (NEXIUM) 40 MG capsule Take 1 capsule (40 mg total) by mouth daily. Take in the morning before breakfast. 30 capsule 11   folic acid (FOLVITE) 1 MG tablet Take 2 mg by mouth daily.     InFLIXimab (REMICADE IV) Inject into the vein as directed.     loperamide (IMODIUM A-D) 2 MG tablet Take 1 mg by mouth as needed. Dihrrhea     metoprolol tartrate (LOPRESSOR) 25 MG tablet Take 1 tablet (25 mg total) by mouth in the morning and at  bedtime. 180 tablet 3   Multiple Vitamin (MULTIVITAMIN) capsule Take 1 capsule by mouth daily. 1 daily     nitroGLYCERIN (NITROSTAT) 0.4 MG SL tablet Place 1 tablet (0.4 mg total) under the tongue every 5 (five) minutes as needed for chest pain. Do not exceed 3 tabs in 15 minutes 75 tablet 3   Probiotic Product ([place]) CAPS Take 1 capsule by mouth daily. 1 per day     valsartan (DIOVAN) 160 MG tablet TAKE 1 TABLET BY MOUTH EVERY DAY 90 tablet 1   warfarin (COUMADIN) 2.5 MG tablet TAKE 1/2 TO 1 TABLET DAILY AS DIRECTED BY COUMADIN CLINIC 90 tablet 0   No current facility-administered medications on file prior to visit.     Objective:  Objective  Physical Exam Constitutional:      General: She is not in acute distress.    Appearance: Normal appearance. She is not ill-appearing or toxic-appearing.  HENT:     Head: Normocephalic and atraumatic.     Right Ear: Tympanic membrane, ear canal and external ear normal.     Left Ear: Tympanic membrane, ear canal and external ear normal.     Nose: No congestion or rhinorrhea.  Eyes:     Extraocular Movements: Extraocular movements intact.     Pupils: Pupils are equal, round, and reactive to light.  Cardiovascular:     Rate and Rhythm: Normal rate and regular rhythm.     Pulses: Normal pulses.     Heart sounds: Normal heart sounds. No murmur heard. Pulmonary:     Effort: Pulmonary effort is normal. No respiratory distress.     Breath sounds: Normal breath sounds. No wheezing, rhonchi or rales.  Abdominal:     General: Bowel sounds are normal.     Palpations: Abdomen is soft. There is no mass.     Tenderness: There is no abdominal tenderness. There is no guarding.     Hernia: No hernia is present.  Musculoskeletal:        General: Normal range of motion.     Cervical back: Normal range of motion and neck supple.  Skin:    General: Skin is warm and dry.  Neurological:     Mental Status: She is alert and oriented to person,  place, and time.  Psychiatric:        Behavior: Behavior normal.   BP 114/60   Pulse 66   Temp 97.6 F (36.4 C)   Resp 18   Wt 247 lb 3.2 oz (112.1 kg)   SpO2 92%   BMI 43.79 kg/m  Wt Readings from Last 3 Encounters:  08/03/21 247 lb 3.2 oz (112.1 kg)  05/24/21 240 lb (108.9 kg)  02/15/21 244 lb 12.8 oz (111 kg)     Lab Results  Component Value Date   WBC 5.8 08/03/2021   HGB 13.1 08/03/2021   HCT 39.6 08/03/2021   PLT 190.0 08/03/2021  GLUCOSE 87 08/03/2021   CHOL 165 08/03/2021   TRIG 113.0 08/03/2021   HDL 50.60 08/03/2021   LDLCALC 92 08/03/2021   ALT 18 08/03/2021   AST 24 08/03/2021   NA 139 08/03/2021   K 4.0 08/03/2021   CL 104 08/03/2021   CREATININE 1.03 08/03/2021   BUN 12 08/03/2021   CO2 27 08/03/2021   TSH 3.02 08/03/2021   INR 1.9 (A) 08/03/2021   HGBA1C 5.7 08/03/2021    DG Bone Density  Result Date: 07/27/2021 EXAM: DUAL X-RAY ABSORPTIOMETRY (DXA) FOR BONE MINERAL DENSITY IMPRESSION: Laurice Iglesia A Harlie Ragle Your patient Xitlalic Maslin completed a BMD test on 07/26/2021 using the [employer] (analysis version: 16.SP2) manufactured by [employer]. The following summarizes the results of our evaluation. [facility] PATIENT: Name: Nelma, Phagan Patient ID: [id number] Birth Date: 16-May-1946 Height: 62.2 in. Gender: Female Measured: 07/26/2021 Weight: 264.8 lbs. Indications: Advanced Age, Caucasian, Estrogen Deficiency, Height Loss, Low Calcium Intake, Post Menopausal, Rheumatoid Arthritis Fractures: Treatments: Methotrexate, Multivitamin, Remicade ASSESSMENT: The BMD measured at Femur Neck Right is 0.649 g/cm2 with a T-score of -2.8. This patient is considered OSTEOPOROTIC according to [facility]) criteria. Lumbar spine was not utilized due to advanced degenerative changes. Compared with the prior study on, 06/05/2017 the BMD of the total mean shows a statistically significant decrease. The scan quality is good. Patient is not a candidate for FRAX  assessment. Site Region Measured Date Measured Age WHO YA BMD Classification T-score DualFemur Neck Right 07/26/2020 75.9 Osteoporosis -2.8 0.649 g/cm2 DualFemur Neck Right 06/05/2017 71.7 Osteoporosis -2.7 0.663 g/cm2 DualFemur Total Mean 07/26/2021 75.9 Osteopenia -2.0 0.753 g/cm2 DualFemur Total Mean 06/05/2017 71.7 Osteopenia -1.7 0.789 g/cm2 Left Forearm Radius 33% 07/26/2021 75.9 Osteopenia -1.7 0.727 g/cm2 Left Forearm Radius 33% 06/05/2017 71.7 Osteopenia -1.6 0.735 g/cm2 World Health Organization [facility]) criteria for post-menopausal, Caucasian Women: Normal       T-score at or above -1 SD Osteopenia   T-score between -1 and -2.5 SD Osteoporosis T-score at or below -2.5 SD RECOMMENDATION: 1. All patients should optimize calcium and vitamin D intake. 2. Consider FDA-approved medical therapies in postmenopausal women and men aged 66 years and older, based on the following: a. A hip or vertebral(clinical or morphometric) fracture. b. T-Score < -2.5 at the femoral neck or spine after appropriate evaluation to exclude secondary causes c. Low bone mass (T-score between -1.0 and -2.5 at the femoral neck or spine) and a 10 year probability of a hip fracture >3% or a 10 year probability of major osteoporosis-related fracture > 20% based on the [place]-adapted WHO algorithm d. Clinical judgement and/or patient preferences may indicate treatment for people with 10-year fracture probabilities above or below these levels FOLLOW-UP: Patients with diagnosis of osteoporosis or at high risk for fracture should have regular bone mineral density tests. For patients eligible for Medicare, routine testing is allowed once every 2 years. The testing frequency can be increased to one year for patients who have rapidly progressing disease, those who are receiving or discontinuing medical therapy to restore bone mass, or have additional risk factors. I have reviewed this report, and agree with the above findings. Mark A. Thornton Papas, M.D.  [facility] Electronically Signed   By: Lavonia Dana M.D.   On: 07/27/2021 10:52     Assessment & Plan:  Plan    No orders of the defined types were placed in this encounter.   Problem List Items Addressed This Visit     Hyperlipidemia  Encourage heart healthy diet such as MIND or DASH diet, increase exercise, avoid trans fats, simple carbohydrates and processed foods, consider a krill or fish or flaxseed oil cap daily.       Relevant Orders   Lipid panel (Completed)   Essential hypertension    Well controlled, no changes to meds. Encouraged heart healthy diet such as the DASH diet and exercise as tolerated.       Relevant Orders   CBC (Completed)   Comprehensive metabolic panel (Completed)   TSH (Completed)   ATRIAL FIBRILLATION   Osteoporosis   Relevant Orders   VITAMIN D 25 Hydroxy (Vit-D Deficiency, Fractures) (Completed)   Hyperglycemia    hgba1c acceptable, minimize simple carbs. Increase exercise as tolerated.       Relevant Orders   Hemoglobin A1c (Completed)   Crohn's colitis ([place])    Doing well on Remicade and MTX IM      Statin intolerance    Unable to tolerate statins      Psoriasis    Doing well on current meds.       Vitamin D deficiency    Supplement and monitor      Other Visit Diagnoses     Ingrown right greater toenail    -  Primary   Relevant Orders   Ambulatory referral to Podiatry   Need for influenza vaccination       Relevant Orders   Flu Vaccine QUAD High Dose(Fluad) (Completed)       Follow-up: Return in about 6 months (around 02/01/2022) for annual CPE.  I, Suezanne Jacquet, acting as a scribe for Penni Homans, MD, have documented all relevent documentation on behalf of Penni Homans, MD, as directed by Penni Homans, MD while in the presence of Penni Homans, MD. DO:08/04/21.  I, [name], MD personally performed the services described in this documentation. All medical record entries made by the scribe were at my  direction and in my presence. I have reviewed the chart and agree that the record reflects my personal performance and is accurate and complete

## 2021-08-03 NOTE — Patient Instructions (Addendum)
Viactiv Chewable Calcium for bone density supplements  Prolia is the shot you take twice a year for osteoporosis   Call if injured for Tdap shot  Bivalent covid booster available with walk-ins at [facility] Mon-Fri, 9am-4pm or any other pharmacy.  Molnupiravir/Paxlovid is the new COVID medication we can give you if you get COVID so make sure you test if you have symptoms because we have to treat by day 5 of symptoms for it to be effective. If you are positive let us know so we can treat. If a home test is negative and your symptoms are persistent get a PCR test. Can check testing locations at [email or web address] If you are positive we will make an appointment with [place] and we will send in molnupiravir/paxlovid if you would like it. Check with your pharmacy before we meet to confirm they have it in stock, if they do not then we can get the prescription at the [facility].

## 2021-08-04 DIAGNOSIS — E559 Vitamin D deficiency, unspecified: Secondary | ICD-10-CM | POA: Insufficient documentation

## 2021-08-04 DIAGNOSIS — L409 Psoriasis, unspecified: Secondary | ICD-10-CM | POA: Insufficient documentation

## 2021-08-04 LAB — COMPREHENSIVE METABOLIC PANEL
ALT: 18 U/L (ref 0–35)
AST: 24 U/L (ref 0–37)
Albumin: 3.9 g/dL (ref 3.5–5.2)
Alkaline Phosphatase: 76 U/L (ref 39–117)
BUN: 12 mg/dL (ref 6–23)
CO2: 27 mEq/L (ref 19–32)
Calcium: 9.2 mg/dL (ref 8.4–10.5)
Chloride: 104 mEq/L (ref 96–112)
Creatinine, Ser: 1.03 mg/dL (ref 0.40–1.20)
GFR: 53.02 mL/min — ABNORMAL LOW (ref >60.00)
Glucose, Bld: 87 mg/dL (ref 70–99)
Potassium: 4 mEq/L (ref 3.5–5.1)
Sodium: 139 mEq/L (ref 135–145)
Total Bilirubin: 0.5 mg/dL (ref 0.2–1.2)
Total Protein: 7.6 g/dL (ref 6.0–8.3)

## 2021-08-04 LAB — LIPID PANEL
Cholesterol: 165 mg/dL (ref 0–200)
HDL: 50.6 mg/dL (ref >39.00)
LDL Cholesterol: 92 mg/dL (ref 0–99)
NonHDL: 114.85
Total CHOL/HDL Ratio: 3
Triglycerides: 113 mg/dL (ref 0.0–149.0)
VLDL: 22.6 mg/dL (ref 0.0–40.0)

## 2021-08-04 LAB — VITAMIN D 25 HYDROXY (VIT D DEFICIENCY, FRACTURES): VITD: 47.05 ng/mL (ref 30.00–100.00)

## 2021-08-04 LAB — CBC
HCT: 39.6 % (ref 36.0–46.0)
Hemoglobin: 13.1 g/dL (ref 12.0–15.0)
MCHC: 33 g/dL (ref 30.0–36.0)
MCV: 102.1 fl — ABNORMAL HIGH (ref 78.0–100.0)
Platelets: 190 10*3/uL (ref 150.0–400.0)
RBC: 3.87 Mil/uL (ref 3.87–5.11)
RDW: 14.8 % (ref 11.5–15.5)
WBC: 5.8 10*3/uL (ref 4.0–10.5)

## 2021-08-04 LAB — HEMOGLOBIN A1C: Hgb A1c MFr Bld: 5.7 % (ref 4.6–6.5)

## 2021-08-04 LAB — TSH: TSH: 3.02 u[IU]/mL (ref 0.35–5.50)

## 2021-08-04 NOTE — Assessment & Plan Note (Signed)
Well controlled, no changes to meds. Encouraged heart healthy diet such as the DASH diet and exercise as tolerated.  °

## 2021-08-04 NOTE — Assessment & Plan Note (Signed)
Unable to tolerate statins 

## 2021-08-04 NOTE — Assessment & Plan Note (Signed)
Supplement and monitor 

## 2021-08-04 NOTE — Assessment & Plan Note (Signed)
Encourage heart healthy diet such as MIND or DASH diet, increase exercise, avoid trans fats, simple carbohydrates and processed foods, consider a krill or fish or flaxseed oil cap daily.  °

## 2021-08-04 NOTE — Assessment & Plan Note (Signed)
Doing well on current meds.

## 2021-08-04 NOTE — Assessment & Plan Note (Signed)
hgba1c acceptable, minimize simple carbs. Increase exercise as tolerated.  

## 2021-08-04 NOTE — Assessment & Plan Note (Signed)
Doing well on Remicade and MTX IM

## 2021-08-10 DIAGNOSIS — Z79899 Other long term (current) drug therapy: Secondary | ICD-10-CM | POA: Diagnosis not present

## 2021-08-10 DIAGNOSIS — M0589 Other rheumatoid arthritis with rheumatoid factor of multiple sites: Secondary | ICD-10-CM | POA: Diagnosis not present

## 2021-08-18 ENCOUNTER — Other Ambulatory Visit: Payer: Self-pay

## 2021-08-18 ENCOUNTER — Encounter: Payer: Self-pay | Admitting: Sports Medicine

## 2021-08-18 ENCOUNTER — Ambulatory Visit (INDEPENDENT_AMBULATORY_CARE_PROVIDER_SITE_OTHER): Payer: Medicare Other | Admitting: Sports Medicine

## 2021-08-18 DIAGNOSIS — B351 Tinea unguium: Secondary | ICD-10-CM

## 2021-08-18 DIAGNOSIS — H209 Unspecified iridocyclitis: Secondary | ICD-10-CM | POA: Insufficient documentation

## 2021-08-18 DIAGNOSIS — R269 Unspecified abnormalities of gait and mobility: Secondary | ICD-10-CM | POA: Insufficient documentation

## 2021-08-18 DIAGNOSIS — K5090 Crohn's disease, unspecified, without complications: Secondary | ICD-10-CM | POA: Insufficient documentation

## 2021-08-18 DIAGNOSIS — Q605 Renal hypoplasia, unspecified: Secondary | ICD-10-CM | POA: Insufficient documentation

## 2021-08-18 DIAGNOSIS — I2510 Atherosclerotic heart disease of native coronary artery without angina pectoris: Secondary | ICD-10-CM

## 2021-08-18 DIAGNOSIS — M79674 Pain in right toe(s): Secondary | ICD-10-CM

## 2021-08-18 DIAGNOSIS — Z85828 Personal history of other malignant neoplasm of skin: Secondary | ICD-10-CM | POA: Insufficient documentation

## 2021-08-18 DIAGNOSIS — M79675 Pain in left toe(s): Secondary | ICD-10-CM

## 2021-08-18 DIAGNOSIS — I739 Peripheral vascular disease, unspecified: Secondary | ICD-10-CM

## 2021-08-18 DIAGNOSIS — Z7901 Long term (current) use of anticoagulants: Secondary | ICD-10-CM

## 2021-08-18 NOTE — Progress Notes (Signed)
Subjective: [name] is a 75 y.o. female patient seen today in office with complaint of mildly painful thickened and elongated toenails; unable to trim. Patient reports that she has issues with ingrowing and pain sometimes at the right hallux nail.  Patient reports that her nails are very thick and no longer trim them herself reports that she is afraid to especially with use of blood thinner and history of rheumatoid unable to grasp clippers to trim toenails. 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   Uveitis 08/18/2021   Psoriasis 08/04/2021   Vitamin D deficiency 08/04/2021   Blepharitis 11/17/2019   Hip pain, chronic, left 12/03/2018   Dry eyes 08/05/2018   Onychomycosis 06/03/2018   Renal oncocytoma    Pseudoaneurysm of right femoral artery (HCC)    Paroxysmal atrial fibrillation (HCC)    Pancreatitis    Hypertension    GERD (gastroesophageal reflux disease)    CAD (coronary artery disease)    Arthritis    Anxiety    Rheumatoid arthritis ([place]) 05/29/2017   Skin lesion 05/29/2017   Dry mouth 08/22/2016   Cerebrovascular disease 07/30/2014   Sinusitis 06/30/2014   Encounter for therapeutic drug monitoring 06/09/2014   Pain in joint of right shoulder 04/21/2014   Other acne 04/21/2014   Intertrigo 11/12/2013   Keloid of skin    Bilateral leg edema 09/12/2012   IBS (irritable bowel syndrome) 08/21/2012   Medicare annual wellness visit, subsequent 08/17/2012   Radicular low back pain 06/28/2012   Basal cell carcinoma 02/07/2012   Maxillary sinus cyst 02/07/2012   Statin intolerance 02/28/2011   Crohn's colitis ([place])    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   CAROTID BRUIT 12/10/2009   Hyperglycemia 07/02/2009   HEADACHE 02/24/2009   ATRIAL FIBRILLATION 01/15/2009   Obesity  01/03/2008   OSA (obstructive sleep apnea) 01/03/2008   Osteoporosis 11/05/2007   Hyperlipidemia 09/06/2007   DEPRESSION/ANXIETY 09/06/2007   Hearing loss 09/06/2007   Essential hypertension 09/06/2007   MYOCARDIAL INFARCTION, HX OF 09/06/2007   Coronary atherosclerosis 09/06/2007   ALLERGIC RHINITIS 09/06/2007   COLONIC POLYPS, ADENOMATOUS, HX OF 07/04/2007    Current Outpatient Medications on File Prior to Visit  Medication Sig Dispense Refill   doxazosin (CARDURA) 2 MG tablet      amLODipine (NORVASC) 10 MG tablet TAKE 1 TABLET BY MOUTH EVERY DAY 90 tablet 2   dextrose 5 % SOLN 50 mL with methotrexate 25 MG/ML (PF) SOLN 40 mg/m2 Inject 40 mg/m2 into the vein once a week. Every wednesday     dofetilide (TIKOSYN) 125 MCG capsule TAKE 3 CAPSULES BY MOUTH TWICE A DAY 540 capsule 1   escitalopram (LEXAPRO) 20 MG tablet TAKE 1 TABLET BY MOUTH EVERY DAY 90 tablet 2   esomeprazole (NEXIUM) 40 MG capsule Take 1 capsule (40 mg total) by mouth daily. Take in the morning before breakfast. 30 capsule 11   folic acid (FOLVITE) 1 MG tablet Take 2 mg by mouth daily.     InFLIXimab (REMICADE IV) Inject into the vein as directed.     loperamide (IMODIUM A-D) 2 MG tablet Take 1 mg by mouth as needed. Dihrrhea     losartan (COZAAR) 100 MG tablet      metoprolol tartrate (LOPRESSOR) 25 MG tablet Take 1 tablet (25 mg total) by mouth  in the morning and at bedtime. 180 tablet 3   Multiple Vitamin (MULTIVITAMIN) capsule Take 1 capsule by mouth daily. 1 daily     nitroGLYCERIN (NITROSTAT) 0.4 MG SL tablet Place 1 tablet (0.4 mg total) under the tongue every 5 (five) minutes as needed for chest pain. Do not exceed 3 tabs in 15 minutes 75 tablet 3   Probiotic Product ([place]) CAPS Take 1 capsule by mouth daily. 1 per day     TUBERCULIN SYR 1CC/27GX1/2" (B-D TB SYRINGE 1CC/27GX1/2") 27G X 1/2" 1 ML MISC      valsartan (DIOVAN) 160 MG tablet TAKE 1 TABLET BY MOUTH EVERY DAY 90 tablet 1   warfarin  (COUMADIN) 2.5 MG tablet TAKE 1/2 TO 1 TABLET DAILY AS DIRECTED BY COUMADIN CLINIC 90 tablet 0   No current facility-administered medications on file prior to visit.    Allergies  Allergen Reactions   Hydralazine Diarrhea and Nausea Only    Anorexia, upset stomach, burning pain in abdomin   Cefdinir     Pt cannot take; may interfere with AFib.   Codeine     Heart beats fast    Fosamax [Alendronate Sodium] Other (See Comments)    GI upset   Guaifenesin Er Nausea And Vomiting and Other (See Comments)    Headaches; can tolerate liquid.   Latex Other (See Comments)    Breathing problems   Mucilgen [Psyllium]    Percocet [Oxycodone-Acetaminophen]     Itching & swelling in jaw    Objective: Physical Exam  General: Well developed, nourished, no acute distress, awake, alert and oriented x 3  Vascular: Dorsalis pedis artery 1/4 bilateral, Posterior tibial artery 0/4 bilateral, skin temperature warm to warm proximal to distal bilateral lower extremities, mild varicosities, pedal hair present bilateral.  Trace edema to ankles.  Neurological: Gross sensation present via light touch bilateral.   Dermatological: Skin is warm, dry, and supple bilateral, Nails 1-10 are tender, long, thick, and discolored with mild subungal debris, no webspace macerations present bilateral, no incurvation noted bilateral hallux nails with no acute signs of infection, no open lesions present bilateral, no callus/corns/hyperkeratotic tissue present bilateral. No signs of infection bilateral.  Musculoskeletal: No symptomatic boney deformities noted bilateral. Muscular strength within normal limits without painon range of motion. No pain with calf compression bilateral.  Assessment and Plan:  Problem List Items Addressed This Visit   None Visit Diagnoses     Pain due to onychomycosis of toenails of both feet    -  Primary   PVD (peripheral vascular disease) ([place])       Relevant Medications   doxazosin  (CARDURA) 2 MG tablet   losartan (COZAAR) 100 MG tablet   Current use of anticoagulant therapy           -Examined patient.  -Discussed treatment options for painful mycotic nails. -Mechanically debrided and reduced mycotic nails with sterile nail nipper and dremel nail file without incident. -Patient to return in 3 months for follow up evaluation or sooner if symptoms worsen.  Landis Martins, DPM

## 2021-08-30 DIAGNOSIS — Z20828 Contact with and (suspected) exposure to other viral communicable diseases: Secondary | ICD-10-CM | POA: Diagnosis not present

## 2021-09-10 ENCOUNTER — Other Ambulatory Visit: Payer: Self-pay | Admitting: Cardiology

## 2021-09-19 ENCOUNTER — Other Ambulatory Visit: Payer: Self-pay | Admitting: Cardiology

## 2021-09-21 DIAGNOSIS — M0589 Other rheumatoid arthritis with rheumatoid factor of multiple sites: Secondary | ICD-10-CM | POA: Diagnosis not present

## 2021-09-28 ENCOUNTER — Ambulatory Visit (INDEPENDENT_AMBULATORY_CARE_PROVIDER_SITE_OTHER): Payer: Medicare Other

## 2021-09-28 ENCOUNTER — Other Ambulatory Visit: Payer: Self-pay

## 2021-09-28 DIAGNOSIS — Z5181 Encounter for therapeutic drug level monitoring: Secondary | ICD-10-CM | POA: Diagnosis not present

## 2021-09-28 DIAGNOSIS — I4891 Unspecified atrial fibrillation: Secondary | ICD-10-CM | POA: Diagnosis not present

## 2021-09-28 LAB — POCT INR: INR: 2 (ref 2.0–3.0)

## 2021-09-28 NOTE — Patient Instructions (Signed)
Description   Take 1 tablet today and then continue warfarin 1/2 tablet daily except 1 tablet each Monday and Friday. Repeat INR in 8 weeks.

## 2021-09-29 DIAGNOSIS — K5090 Crohn's disease, unspecified, without complications: Secondary | ICD-10-CM | POA: Diagnosis not present

## 2021-09-29 DIAGNOSIS — Q600 Renal agenesis, unilateral: Secondary | ICD-10-CM | POA: Diagnosis not present

## 2021-09-29 DIAGNOSIS — M65322 Trigger finger, left index finger: Secondary | ICD-10-CM | POA: Diagnosis not present

## 2021-09-29 DIAGNOSIS — Z6841 Body Mass Index (BMI) 40.0 and over, adult: Secondary | ICD-10-CM | POA: Diagnosis not present

## 2021-09-29 DIAGNOSIS — H209 Unspecified iridocyclitis: Secondary | ICD-10-CM | POA: Diagnosis not present

## 2021-09-29 DIAGNOSIS — M545 Low back pain, unspecified: Secondary | ICD-10-CM | POA: Diagnosis not present

## 2021-09-29 DIAGNOSIS — Z85828 Personal history of other malignant neoplasm of skin: Secondary | ICD-10-CM | POA: Diagnosis not present

## 2021-09-29 DIAGNOSIS — M0589 Other rheumatoid arthritis with rheumatoid factor of multiple sites: Secondary | ICD-10-CM | POA: Diagnosis not present

## 2021-10-04 NOTE — Progress Notes (Signed)
HPI:FU coronary artery disease and atrial fibrillation. Last cardiac catheterization on Mar 06, 2007 showed an ejection fraction of 55%. There is nonobstructive plaque in the LAD, and circumflex and the right coronary artery was normal. 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 Also note the patient is extremely sensitive to Coumadin. MRA of the abdomen in September of 2012 showed no left renal artery stenosis and previous right nephrectomy. 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 Event monitor January 2018 showed sinus with PACs, PVCs and ectopic atrial tachycardia. Carotid dopplers 11/21 showed 1-39 bilateral stenosis. Echo 11/21 showed normal LV function, grade 2 DD, moderate pulmonary hypertension, moderate LAE, mild RAE. Since I last saw her, she denies dyspnea, chest pain, palpitations or syncope.  No bleeding.  She has had no recurrent episodes of atrial fibrillation.  Current Outpatient Medications  Medication Sig Dispense Refill   amLODipine (NORVASC) 10 MG tablet TAKE 1 TABLET BY MOUTH EVERY DAY 90 tablet 2   dextrose 5 % SOLN 50 mL with methotrexate 25 MG/ML (PF) SOLN 40 mg/m2 Inject 40 mg/m2 into the vein once a week. Every wednesday     dofetilide (TIKOSYN) 125 MCG capsule TAKE 3 CAPSULES BY MOUTH TWICE A DAY 540 capsule 1   doxazosin (CARDURA) 2 MG tablet TAKE 1 TABLET BY MOUTH EVERY DAY 90 tablet 2   escitalopram (LEXAPRO) 20 MG tablet TAKE 1 TABLET BY MOUTH EVERY DAY 90 tablet 2   esomeprazole (NEXIUM) 40 MG capsule Take 1 capsule (40 mg total) by mouth daily. Take in the morning before breakfast. 30 capsule 11   folic acid (FOLVITE) 1 MG tablet Take 2 mg by mouth daily.     InFLIXimab (REMICADE IV) Inject into the vein as directed.     inFLIXimab 10 mg/kg in sodium chloride 0.9 % Inject 10 mg/kg into the vein every 6 (six) weeks.     loperamide (IMODIUM  A-D) 2 MG tablet Take 1 mg by mouth as needed. Dihrrhea     methotrexate 50 MG/2ML injection SMARTSIG:0.4 Milliliter(s) Injection Once a Week     metoprolol tartrate (LOPRESSOR) 25 MG tablet Take 1 tablet (25 mg total) by mouth in the morning and at bedtime. 180 tablet 3   Multiple Vitamin (MULTIVITAMIN) capsule Take 1 capsule by mouth daily. 1 daily     nitroGLYCERIN (NITROSTAT) 0.4 MG SL tablet Place 1 tablet (0.4 mg total) under the tongue every 5 (five) minutes as needed for chest pain. Do not exceed 3 tabs in 15 minutes 75 tablet 3   Probiotic Product ([place]) CAPS Take 1 capsule by mouth daily. 1 per day     TUBERCULIN SYR 1CC/27GX1/2" 27G X 1/2" 1 ML MISC      valsartan (DIOVAN) 160 MG tablet TAKE 1 TABLET BY MOUTH EVERY DAY 90 tablet 1   warfarin (COUMADIN) 2.5 MG tablet TAKE 1/2 TO 1 TABLET DAILY AS DIRECTED BY COUMADIN CLINIC 90 tablet 0   losartan (COZAAR) 100 MG tablet      No current facility-administered medications for this visit.     Past Medical History:  Diagnosis Date   Anxiety    Arthritis    CAD (coronary artery disease)    Crohn's colitis ([place])    she reports ulcerative colitis beginning in her 14's, biopsies 2008 suggest Crohn's not UC   Depression  GERD (gastroesophageal reflux disease)    Hyperlipidemia    Hypertension    Keloid of skin    Obesity    OSA (obstructive sleep apnea)    Pancreatitis    Paroxysmal atrial fibrillation (HCC)    Pseudoaneurysm of right femoral artery (HCC)    Renal oncocytoma    s/p right nephrectomy   Rheumatoid arthritis ([place]) 05/29/2017    Past Surgical History:  Procedure Laterality Date   BREAST BIOPSY  1996   benign lesion    Cataract surgery Left 11/13/14   CHOLECYSTECTOMY  10/2006   COLONOSCOPY W/ BIOPSIES     CORONARY ANGIOPLASTY     ESOPHAGOGASTRODUODENOSCOPY     NEPHRECTOMY  10/2006   right secondary to oncocytoma    RIGHT FEMORAL  PSEUDOANEURYSM & RIGHT GROIN HEMATOMA EVACUATION  02/2007   RIGHT  RENAL ARTERY REPAIR  1976   TUBAL LIGATION      Social History   Socioeconomic History   Marital status: Married    Spouse name: Not on file   Number of children: 3   Years of education: Not on file   Highest education level: Not on file  Occupational History   Occupation: Retired    Fish farm manager: RETIRED    Comment: retired  Tobacco Use   Smoking status: Never   Smokeless tobacco: Never  Vaping Use   Vaping Use: Never used  Substance and Sexual Activity   Alcohol use: No   Drug use: No   Sexual activity: Never  Other Topics Concern   Not on file  Social History Narrative   Married since 1965    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, Oceanographer)   3 children - adults, 2 grandchildren   No pets   Social Determinants of Radio broadcast assistant Strain: Low Risk    Difficulty of Paying Living Expenses: Not hard at all  Food Insecurity: No Food Insecurity   Worried About Charity fundraiser in the Last Year: Never true   Arboriculturist in the Last Year: Never true  Transportation Needs: No Transportation Needs   Lack of Transportation (Medical): No   Lack of Transportation (Non-Medical): No  Physical Activity: Inactive   Days of Exercise per Week: 0 days   Minutes of Exercise per Session: 0 min  Stress: No Stress Concern Present   Feeling of Stress : Not at all  Social Connections: Moderately Isolated   Frequency of Communication with Friends and Family: More than three times a week   Frequency of Social Gatherings with Friends and Family: More than three times a week   Attends Religious Services: Never   Marine scientist or Organizations: No   Attends Music therapist: Never   Marital Status: Married  Human resources officer Violence: Not At Risk   Fear of Current or Ex-Partner: No   Emotionally Abused: No   Physically Abused: No   Sexually Abused: No    Family History  Problem Relation Age of Onset   Colitis Father    Heart disease  Father    Hypertension Mother    Coronary artery disease Mother    Stroke Mother    Clotting disorder Mother    Heart disease Mother    Allergies Mother    Stroke Brother    Colon cancer Paternal Aunt    Clotting disorder Brother    Clotting disorder Sister    Asthma Daughter    Asthma Maternal Uncle  Colon polyps Neg Hx    Kidney disease Neg Hx     ROS: no fevers or chills, productive cough, hemoptysis, dysphasia, odynophagia, melena, hematochezia, dysuria, hematuria, rash, seizure activity, orthopnea, PND, pedal edema, claudication. Remaining systems are negative.  Physical Exam: Well-developed obese in no acute distress.  Skin is warm and dry.  HEENT is normal.  Neck is supple.  Chest is clear to auscultation with normal expansion.  Cardiovascular exam is regular rate and rhythm.  Abdominal exam nontender or distended. No masses palpated. Extremities show no edema. neuro grossly intact  ECG-sinus bradycardia at a rate of 55, normal axis, no ST changes, normal QT interval.  Personally reviewed  A/P  1 paroxysmal atrial fibrillation-she remains in sinus rhythm today.  We will continue Tikosyn and Coumadin.    2 hypertension-patient's blood pressure is controlled.  Continue present medical regimen.  3 hyperlipidemia-she is intolerant to statins and has declined other lipid-lowering medications.  4 coronary artery disease-she denies chest pain.  She is not on aspirin given need for Coumadin.  Intolerant to statins.  5 carotid artery disease-mild on most recent Dopplers.  Kirk Ruths, MD

## 2021-10-08 ENCOUNTER — Other Ambulatory Visit: Payer: Self-pay

## 2021-10-08 ENCOUNTER — Encounter: Payer: Self-pay | Admitting: Cardiology

## 2021-10-08 ENCOUNTER — Ambulatory Visit (INDEPENDENT_AMBULATORY_CARE_PROVIDER_SITE_OTHER): Payer: Medicare Other | Admitting: Cardiology

## 2021-10-08 VITALS — BP 124/80 | HR 55 | Ht 63.0 in | Wt 249.2 lb

## 2021-10-08 DIAGNOSIS — I480 Paroxysmal atrial fibrillation: Secondary | ICD-10-CM | POA: Diagnosis not present

## 2021-10-08 DIAGNOSIS — E7800 Pure hypercholesterolemia, unspecified: Secondary | ICD-10-CM

## 2021-10-08 DIAGNOSIS — I2510 Atherosclerotic heart disease of native coronary artery without angina pectoris: Secondary | ICD-10-CM | POA: Diagnosis not present

## 2021-10-08 DIAGNOSIS — I10 Essential (primary) hypertension: Secondary | ICD-10-CM | POA: Diagnosis not present

## 2021-10-08 NOTE — Patient Instructions (Signed)

## 2021-10-31 DIAGNOSIS — Z20822 Contact with and (suspected) exposure to covid-19: Secondary | ICD-10-CM | POA: Diagnosis not present

## 2021-11-02 DIAGNOSIS — M0589 Other rheumatoid arthritis with rheumatoid factor of multiple sites: Secondary | ICD-10-CM | POA: Diagnosis not present

## 2021-11-02 DIAGNOSIS — Z79899 Other long term (current) drug therapy: Secondary | ICD-10-CM | POA: Diagnosis not present

## 2021-11-08 ENCOUNTER — Other Ambulatory Visit: Payer: Self-pay | Admitting: Cardiology

## 2021-11-08 DIAGNOSIS — I480 Paroxysmal atrial fibrillation: Secondary | ICD-10-CM

## 2021-11-18 DIAGNOSIS — H35342 Macular cyst, hole, or pseudohole, left eye: Secondary | ICD-10-CM | POA: Diagnosis not present

## 2021-11-18 DIAGNOSIS — H5213 Myopia, bilateral: Secondary | ICD-10-CM | POA: Diagnosis not present

## 2021-11-18 DIAGNOSIS — H02834 Dermatochalasis of left upper eyelid: Secondary | ICD-10-CM | POA: Diagnosis not present

## 2021-11-18 DIAGNOSIS — H524 Presbyopia: Secondary | ICD-10-CM | POA: Diagnosis not present

## 2021-11-18 DIAGNOSIS — H40013 Open angle with borderline findings, low risk, bilateral: Secondary | ICD-10-CM | POA: Diagnosis not present

## 2021-11-18 DIAGNOSIS — H26493 Other secondary cataract, bilateral: Secondary | ICD-10-CM | POA: Diagnosis not present

## 2021-11-18 DIAGNOSIS — H52222 Regular astigmatism, left eye: Secondary | ICD-10-CM | POA: Diagnosis not present

## 2021-11-23 ENCOUNTER — Other Ambulatory Visit: Payer: Self-pay

## 2021-11-23 ENCOUNTER — Ambulatory Visit (INDEPENDENT_AMBULATORY_CARE_PROVIDER_SITE_OTHER): Payer: Medicare Other

## 2021-11-23 DIAGNOSIS — Z5181 Encounter for therapeutic drug level monitoring: Secondary | ICD-10-CM

## 2021-11-23 DIAGNOSIS — I4891 Unspecified atrial fibrillation: Secondary | ICD-10-CM

## 2021-11-23 LAB — POCT INR: INR: 1.7 — AB (ref 2.0–3.0)

## 2021-11-23 NOTE — Patient Instructions (Signed)
Description   Take 1 tablet today and then continue warfarin 1/2 tablet daily except 1 tablet each Monday and Friday. Repeat INR in 6 weeks.

## 2021-11-24 ENCOUNTER — Ambulatory Visit (INDEPENDENT_AMBULATORY_CARE_PROVIDER_SITE_OTHER): Payer: Medicare Other | Admitting: Sports Medicine

## 2021-11-24 ENCOUNTER — Encounter: Payer: Self-pay | Admitting: Sports Medicine

## 2021-11-24 DIAGNOSIS — M79674 Pain in right toe(s): Secondary | ICD-10-CM | POA: Diagnosis not present

## 2021-11-24 DIAGNOSIS — B351 Tinea unguium: Secondary | ICD-10-CM

## 2021-11-24 DIAGNOSIS — Z7901 Long term (current) use of anticoagulants: Secondary | ICD-10-CM

## 2021-11-24 DIAGNOSIS — M79675 Pain in left toe(s): Secondary | ICD-10-CM | POA: Diagnosis not present

## 2021-11-24 DIAGNOSIS — I739 Peripheral vascular disease, unspecified: Secondary | ICD-10-CM

## 2021-11-24 NOTE — Progress Notes (Signed)
Subjective: [name] is a 76 y.o. female patient seen today in office with complaint of mildly painful thickened and elongated toenails; unable to trim. Patient reports no changes with medications since last visit.    Currently anticoagulated for A. fib.  History of rheumatoid arthritis.  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   Uveitis 08/18/2021   Psoriasis 08/04/2021   Vitamin D deficiency 08/04/2021   Blepharitis 11/17/2019   Hip pain, chronic, left 12/03/2018   Dry eyes 08/05/2018   Onychomycosis 06/03/2018   Renal oncocytoma    Pseudoaneurysm of right femoral artery (HCC)    Paroxysmal atrial fibrillation (HCC)    Pancreatitis    Hypertension    GERD (gastroesophageal reflux disease)    CAD (coronary artery disease)    Arthritis    Anxiety    Rheumatoid arthritis ([place]) 05/29/2017   Skin lesion 05/29/2017   Dry mouth 08/22/2016   Cerebrovascular disease 07/30/2014   Sinusitis 06/30/2014   Encounter for therapeutic drug monitoring 06/09/2014   Pain in joint of right shoulder 04/21/2014   Other acne 04/21/2014   Intertrigo 11/12/2013   Keloid of skin    Bilateral leg edema 09/12/2012   IBS (irritable bowel syndrome) 08/21/2012   Medicare annual wellness visit, subsequent 08/17/2012   Radicular low back pain 06/28/2012   Basal cell carcinoma 02/07/2012   Maxillary sinus cyst 02/07/2012   Statin intolerance 02/28/2011   Crohn's colitis ([place])    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   CAROTID BRUIT 12/10/2009   Hyperglycemia 07/02/2009   HEADACHE 02/24/2009   ATRIAL FIBRILLATION 01/15/2009   Obesity 01/03/2008   OSA (obstructive sleep apnea) 01/03/2008   Osteoporosis 11/05/2007   Hyperlipidemia 09/06/2007   DEPRESSION/ANXIETY 09/06/2007   Hearing loss 09/06/2007    Essential hypertension 09/06/2007   MYOCARDIAL INFARCTION, HX OF 09/06/2007   Coronary atherosclerosis 09/06/2007   ALLERGIC RHINITIS 09/06/2007   COLONIC POLYPS, ADENOMATOUS, HX OF 07/04/2007    Current Outpatient Medications on File Prior to Visit  Medication Sig Dispense Refill   amLODipine (NORVASC) 10 MG tablet TAKE 1 TABLET BY MOUTH EVERY DAY 90 tablet 2   dextrose 5 % SOLN 50 mL with methotrexate 25 MG/ML (PF) SOLN 40 mg/m2 Inject 40 mg/m2 into the vein once a week. Every wednesday     dofetilide (TIKOSYN) 125 MCG capsule TAKE 3 CAPSULES BY MOUTH TWICE A DAY 540 capsule 1   doxazosin (CARDURA) 2 MG tablet TAKE 1 TABLET BY MOUTH EVERY DAY 90 tablet 2   escitalopram (LEXAPRO) 20 MG tablet TAKE 1 TABLET BY MOUTH EVERY DAY 90 tablet 2   esomeprazole (NEXIUM) 40 MG capsule Take 1 capsule (40 mg total) by mouth daily. Take in the morning before breakfast. 30 capsule 11   folic acid (FOLVITE) 1 MG tablet Take 2 mg by mouth daily.     InFLIXimab (REMICADE IV) Inject into the vein as directed.     inFLIXimab 10 mg/kg in sodium chloride 0.9 % Inject 10 mg/kg into the vein every 6 (six) weeks.     loperamide (IMODIUM A-D) 2 MG tablet Take 1 mg by mouth as needed. Dihrrhea     methotrexate 50 MG/2ML injection SMARTSIG:0.4 Milliliter(s) Injection Once a Week     metoprolol tartrate (LOPRESSOR) 25 MG tablet TAKE 1 TABLET (25 MG TOTAL)  BY MOUTH IN THE MORNING AND AT BEDTIME. 180 tablet 3   Multiple Vitamin (MULTIVITAMIN) capsule Take 1 capsule by mouth daily. 1 daily     nitroGLYCERIN (NITROSTAT) 0.4 MG SL tablet Place 1 tablet (0.4 mg total) under the tongue every 5 (five) minutes as needed for chest pain. Do not exceed 3 tabs in 15 minutes 75 tablet 3   Probiotic Product ([place]) CAPS Take 1 capsule by mouth daily. 1 per day     TUBERCULIN SYR 1CC/27GX1/2" 27G X 1/2" 1 ML MISC      valsartan (DIOVAN) 160 MG tablet TAKE 1 TABLET BY MOUTH EVERY DAY 90 tablet 1   warfarin (COUMADIN)  2.5 MG tablet TAKE 1/2 TO 1 TABLET DAILY AS DIRECTED BY COUMADIN CLINIC 90 tablet 0   No current facility-administered medications on file prior to visit.    Allergies  Allergen Reactions   Hydralazine Diarrhea and Nausea Only    Anorexia, upset stomach, burning pain in abdomin   Cefdinir     Pt cannot take; may interfere with AFib.   Codeine     Heart beats fast    Fosamax [Alendronate Sodium] Other (See Comments)    GI upset   Guaifenesin Er Nausea And Vomiting and Other (See Comments)    Headaches; can tolerate liquid.   Latex Other (See Comments)    Breathing problems   Mucilgen [Psyllium]    Percocet [Oxycodone-Acetaminophen]     Itching & swelling in jaw    Objective: Physical Exam  General: Well developed, nourished, no acute distress, awake, alert and oriented x 3  Vascular: Dorsalis pedis artery 1/4 bilateral, Posterior tibial artery 0/4 bilateral, skin temperature warm to warm proximal to distal bilateral lower extremities, mild varicosities, pedal hair present bilateral.  Trace edema to ankles pitting in nature nonpainful.  Neurological: Gross sensation present via light touch bilateral.   Dermatological: Skin is warm, dry, and supple bilateral, Nails 1-10 are tender, long, thick, and discolored with mild subungal debris, no webspace macerations present bilateral, no acute signs of infection, no open lesions present bilateral, no callus/corns/hyperkeratotic tissue present bilateral. No signs of infection bilateral.  Musculoskeletal: No symptomatic boney deformities noted bilateral. Muscular strength within normal limits without painon range of motion. No pain with calf compression bilateral.  Assessment and Plan:  Problem List Items Addressed This Visit   None Visit Diagnoses     Pain due to onychomycosis of toenails of both feet    -  Primary   PVD (peripheral vascular disease) ([place])       Current use of anticoagulant therapy           -Examined patient.   -Discussed treatment options for painful mycotic nails. -Mechanically debrided and reduced painful mycotic nails with sterile nail nipper and dremel nail file without incident. -Encouraged elevation to assist with edema control -Patient to return in 3 months for follow up evaluation or sooner if symptoms worsen.  Landis Martins, DPM

## 2021-11-26 ENCOUNTER — Other Ambulatory Visit: Payer: Self-pay | Admitting: Cardiology

## 2021-11-26 DIAGNOSIS — Z20822 Contact with and (suspected) exposure to covid-19: Secondary | ICD-10-CM | POA: Diagnosis not present

## 2021-12-08 ENCOUNTER — Other Ambulatory Visit: Payer: Self-pay | Admitting: Cardiology

## 2021-12-09 ENCOUNTER — Other Ambulatory Visit: Payer: Self-pay | Admitting: Cardiology

## 2021-12-20 DIAGNOSIS — M0589 Other rheumatoid arthritis with rheumatoid factor of multiple sites: Secondary | ICD-10-CM | POA: Diagnosis not present

## 2021-12-28 DIAGNOSIS — L820 Inflamed seborrheic keratosis: Secondary | ICD-10-CM | POA: Diagnosis not present

## 2021-12-28 DIAGNOSIS — D485 Neoplasm of uncertain behavior of skin: Secondary | ICD-10-CM | POA: Diagnosis not present

## 2021-12-28 DIAGNOSIS — D0439 Carcinoma in situ of skin of other parts of face: Secondary | ICD-10-CM | POA: Diagnosis not present

## 2021-12-28 DIAGNOSIS — Z23 Encounter for immunization: Secondary | ICD-10-CM | POA: Diagnosis not present

## 2022-01-04 ENCOUNTER — Ambulatory Visit (INDEPENDENT_AMBULATORY_CARE_PROVIDER_SITE_OTHER): Payer: Medicare Other

## 2022-01-04 ENCOUNTER — Other Ambulatory Visit: Payer: Self-pay

## 2022-01-04 DIAGNOSIS — Z5181 Encounter for therapeutic drug level monitoring: Secondary | ICD-10-CM

## 2022-01-04 DIAGNOSIS — I4891 Unspecified atrial fibrillation: Secondary | ICD-10-CM | POA: Diagnosis not present

## 2022-01-04 LAB — POCT INR: INR: 1.8 — AB (ref 2.0–3.0)

## 2022-01-04 NOTE — Patient Instructions (Signed)
Description   ?Take 1 tablet today and then START taking warfarin 1/2 tablet daily except 1 tablet each Monday, Wednesday and Friday. Repeat INR in 4 weeks.   ?  ?   ?

## 2022-02-01 ENCOUNTER — Ambulatory Visit (INDEPENDENT_AMBULATORY_CARE_PROVIDER_SITE_OTHER): Payer: Medicare Other

## 2022-02-01 DIAGNOSIS — I4891 Unspecified atrial fibrillation: Secondary | ICD-10-CM | POA: Diagnosis not present

## 2022-02-01 DIAGNOSIS — Z5181 Encounter for therapeutic drug level monitoring: Secondary | ICD-10-CM | POA: Diagnosis not present

## 2022-02-01 LAB — POCT INR: INR: 2 (ref 2.0–3.0)

## 2022-02-01 NOTE — Patient Instructions (Signed)
Description   ?Take 1 tablet today and then continue taking warfarin 1/2 tablet daily except 1 tablet each Monday, Wednesday and Friday. Repeat INR in 5 weeks.   ?  ?   ?

## 2022-02-03 DIAGNOSIS — Z6841 Body Mass Index (BMI) 40.0 and over, adult: Secondary | ICD-10-CM | POA: Diagnosis not present

## 2022-02-03 DIAGNOSIS — Q600 Renal agenesis, unilateral: Secondary | ICD-10-CM | POA: Diagnosis not present

## 2022-02-03 DIAGNOSIS — Z85828 Personal history of other malignant neoplasm of skin: Secondary | ICD-10-CM | POA: Diagnosis not present

## 2022-02-03 DIAGNOSIS — K5090 Crohn's disease, unspecified, without complications: Secondary | ICD-10-CM | POA: Diagnosis not present

## 2022-02-03 DIAGNOSIS — H209 Unspecified iridocyclitis: Secondary | ICD-10-CM | POA: Diagnosis not present

## 2022-02-03 DIAGNOSIS — M0589 Other rheumatoid arthritis with rheumatoid factor of multiple sites: Secondary | ICD-10-CM | POA: Diagnosis not present

## 2022-02-03 DIAGNOSIS — M545 Low back pain, unspecified: Secondary | ICD-10-CM | POA: Diagnosis not present

## 2022-02-03 DIAGNOSIS — M65322 Trigger finger, left index finger: Secondary | ICD-10-CM | POA: Diagnosis not present

## 2022-02-03 DIAGNOSIS — C4492 Squamous cell carcinoma of skin, unspecified: Secondary | ICD-10-CM | POA: Diagnosis not present

## 2022-02-07 DIAGNOSIS — M0589 Other rheumatoid arthritis with rheumatoid factor of multiple sites: Secondary | ICD-10-CM | POA: Diagnosis not present

## 2022-02-07 DIAGNOSIS — Z20822 Contact with and (suspected) exposure to covid-19: Secondary | ICD-10-CM | POA: Diagnosis not present

## 2022-02-09 NOTE — Progress Notes (Signed)
? ?Subjective:  ? ? Patient ID: [name], female    DOB: 12-18-1945, 76 y.o.   MRN: [id number] ? ?Chief Complaint  ?Patient presents with  ? Medicare CPE  ? ? ?HPI ?Patient is in today for follow up on chronic medical concerns. No recent febrile illness or hospitalizations. She is trying to stay active but she has significant joint pain including wrists, ankles, knees no swelling or warmth. She is working with [employer], is tolerating Methotrexate. She is noting some fullness in her right ear since she tried to clean it. No pain or current discharge. She did note some fluid drainage once about a month ago. Denies CP/palp/SOB/HA/congestion/fevers/GI or GU c/o. Taking meds as prescribed  ? ?Past Medical History:  ?Diagnosis Date  ? Anxiety   ? Arthritis   ? CAD (coronary artery disease)   ? Crohn's colitis ([place])   ? she reports ulcerative colitis beginning in her 10's, biopsies 2008 suggest Crohn's not UC  ? Depression   ? GERD (gastroesophageal reflux disease)   ? Hyperlipidemia   ? Hypertension   ? Keloid of skin   ? Obesity   ? OSA (obstructive sleep apnea)   ? Pancreatitis   ? Paroxysmal atrial fibrillation (HCC)   ? Pseudoaneurysm of right femoral artery ([place])   ? Renal oncocytoma   ? s/p right nephrectomy  ? Rheumatoid arthritis ([place]) 05/29/2017  ? ? ?Past Surgical History:  ?Procedure Laterality Date  ? BREAST BIOPSY  1996  ? benign lesion   ? Cataract surgery Left 11/13/14  ? CHOLECYSTECTOMY  10/2006  ? COLONOSCOPY W/ BIOPSIES    ? CORONARY ANGIOPLASTY    ? ESOPHAGOGASTRODUODENOSCOPY    ? NEPHRECTOMY  10/2006  ? right secondary to oncocytoma   ? RIGHT FEMORAL  PSEUDOANEURYSM & RIGHT GROIN HEMATOMA EVACUATION  02/2007  ? RIGHT RENAL ARTERY REPAIR  1976  ? TUBAL LIGATION    ? ? ?Family History  ?Problem Relation Age of Onset  ? Colitis Father   ? Heart disease Father   ? Hypertension Mother   ? Coronary artery disease Mother   ? Stroke Mother   ? Clotting disorder Mother   ? Heart disease Mother   ? Allergies  Mother   ? Stroke Brother   ? Colon cancer Paternal Aunt   ? Clotting disorder Brother   ? Clotting disorder Sister   ? Asthma Daughter   ? Asthma Maternal Uncle   ? Colon polyps Neg Hx   ? Kidney disease Neg Hx   ? ? ?Social History  ? ?Socioeconomic History  ? Marital status: Married  ?  Spouse name: Not on file  ? Number of children: 3  ? Years of education: Not on file  ? Highest education level: Not on file  ?Occupational History  ? Occupation: Retired  ?  Employer: RETIRED  ?  Comment: retired  ?Tobacco Use  ? Smoking status: Never  ? Smokeless tobacco: Never  ?Vaping Use  ? Vaping Use: Never used  ?Substance and Sexual Activity  ? Alcohol use: No  ? Drug use: No  ? Sexual activity: Never  ?Other Topics Concern  ? Not on file  ?Social History Narrative  ? Married since 1965   ? 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 3 children - adults, 2 grandchildren  ? No pets  ? ?Social Determinants of Health  ? ?Financial Resource Strain: Low Risk   ? Difficulty of Paying Living Expenses:  Not hard at all  ?Food Insecurity: No Food Insecurity  ? Worried About Charity fundraiser in the Last Year: Never true  ? Ran Out of Food in the Last Year: Never true  ?Transportation Needs: No Transportation Needs  ? Lack of Transportation (Medical): No  ? Lack of Transportation (Non-Medical): No  ?Physical Activity: Inactive  ? Days of Exercise per Week: 0 days  ? Minutes of Exercise per Session: 0 min  ?Stress: No Stress Concern Present  ? Feeling of Stress : Not at all  ?Social Connections: Moderately Isolated  ? Frequency of Communication with Friends and Family: More than three times a week  ? Frequency of Social Gatherings with Friends and Family: More than three times a week  ? Attends Religious Services: Never  ? Active Member of Clubs or Organizations: No  ? Attends Archivist Meetings: Never  ? Marital Status: Married  ?Intimate Partner Violence: Not At Risk  ? Fear of Current or Ex-Partner:  No  ? Emotionally Abused: No  ? Physically Abused: No  ? Sexually Abused: No  ? ? ?Outpatient Medications Prior to Visit  ?Medication Sig Dispense Refill  ? amLODipine (NORVASC) 10 MG tablet TAKE 1 TABLET BY MOUTH EVERY DAY 90 tablet 2  ? dextrose 5 % SOLN 50 mL with methotrexate 25 MG/ML (PF) SOLN 40 mg/m2 Inject 40 mg/m2 into the vein once a week. Every wednesday    ? dofetilide (TIKOSYN) 125 MCG capsule TAKE 3 CAPSULES BY MOUTH TWICE A DAY 540 capsule 1  ? doxazosin (CARDURA) 2 MG tablet TAKE 1 TABLET BY MOUTH EVERY DAY 90 tablet 2  ? escitalopram (LEXAPRO) 20 MG tablet TAKE 1 TABLET BY MOUTH EVERY DAY 90 tablet 2  ? esomeprazole (NEXIUM) 40 MG capsule Take 1 capsule (40 mg total) by mouth daily. Take in the morning before breakfast. 30 capsule 11  ? folic acid (FOLVITE) 1 MG tablet Take 2 mg by mouth daily.    ? InFLIXimab (REMICADE IV) Inject into the vein as directed.    ? inFLIXimab 10 mg/kg in sodium chloride 0.9 % Inject 10 mg/kg into the vein every 6 (six) weeks.    ? loperamide (IMODIUM A-D) 2 MG tablet Take 1 mg by mouth as needed. Dihrrhea    ? metoprolol tartrate (LOPRESSOR) 25 MG tablet TAKE 1 TABLET (25 MG TOTAL) BY MOUTH IN THE MORNING AND AT BEDTIME. 180 tablet 3  ? Multiple Vitamin (MULTIVITAMIN) capsule Take 1 capsule by mouth daily. 1 daily    ? nitroGLYCERIN (NITROSTAT) 0.4 MG SL tablet Place 1 tablet (0.4 mg total) under the tongue every 5 (five) minutes as needed for chest pain. Do not exceed 3 tabs in 15 minutes 75 tablet 3  ? Probiotic Product ([place]) CAPS Take 1 capsule by mouth daily. 1 per day    ? TUBERCULIN SYR 1CC/27GX1/2" 27G X 1/2" 1 ML MISC     ? valsartan (DIOVAN) 160 MG tablet TAKE 1 TABLET BY MOUTH EVERY DAY 90 tablet 1  ? warfarin (COUMADIN) 2.5 MG tablet TAKE 1/2 TO 1 TABLET DAILY AS DIRECTED BY COUMADIN CLINIC 90 tablet 0  ? methotrexate 50 MG/2ML injection SMARTSIG:0.4 Milliliter(s) Injection Once a Week    ? ?No facility-administered medications prior to  visit.  ? ? ?Allergies  ?Allergen Reactions  ? Hydralazine Diarrhea and Nausea Only  ?  Anorexia, upset stomach, burning pain in abdomin  ? Cefdinir   ?  Pt cannot take; may  interfere with AFib.  ? Codeine   ?  Heart beats fast ?  ? Fosamax [Alendronate Sodium] Other (See Comments)  ?  GI upset  ? Guaifenesin Er Nausea And Vomiting and Other (See Comments)  ?  Headaches; can tolerate liquid.  ? Latex Other (See Comments)  ?  Breathing problems  ? Mucilgen [Psyllium]   ? Percocet [Oxycodone-Acetaminophen]   ?  Itching & swelling in jaw  ? ? ?Review of Systems  ?Constitutional:  Positive for malaise/fatigue. Negative for chills and fever.  ?HENT:  Negative for congestion and hearing loss.   ?Eyes:  Negative for discharge.  ?Respiratory:  Negative for cough, sputum production and shortness of breath.   ?Cardiovascular:  Negative for chest pain, palpitations and leg swelling.  ?Gastrointestinal:  Negative for abdominal pain, blood in stool, constipation, diarrhea, heartburn, nausea and vomiting.  ?Genitourinary:  Negative for dysuria, frequency, hematuria and urgency.  ?Musculoskeletal:  Positive for joint pain. Negative for back pain, falls and myalgias.  ?Skin:  Negative for rash.  ?Neurological:  Negative for dizziness, sensory change, loss of consciousness, weakness and headaches.  ?Endo/Heme/Allergies:  Negative for environmental allergies. Does not bruise/bleed easily.  ?Psychiatric/Behavioral:  Negative for depression and suicidal ideas. The patient is not nervous/anxious and does not have insomnia.   ? ?   ?Objective:  ?  ?Physical Exam ?Constitutional:   ?   General: She is not in acute distress. ?   Appearance: She is well-developed.  ?HENT:  ?   Head: Normocephalic and atraumatic.  ?Eyes:  ?   Conjunctiva/sclera: Conjunctivae normal.  ?Neck:  ?   Thyroid: No thyromegaly.  ?Cardiovascular:  ?   Rate and Rhythm: Normal rate and regular rhythm.  ?   Heart sounds: Normal heart sounds. No murmur heard. ?Pulmonary:   ?   Effort: Pulmonary effort is normal. No respiratory distress.  ?   Breath sounds: Normal breath sounds.  ?Abdominal:  ?   General: Bowel sounds are normal. There is no distension.  ?   Palpations: A

## 2022-02-10 ENCOUNTER — Encounter: Payer: Self-pay | Admitting: Family Medicine

## 2022-02-10 ENCOUNTER — Ambulatory Visit (INDEPENDENT_AMBULATORY_CARE_PROVIDER_SITE_OTHER): Payer: Medicare Other | Admitting: Family Medicine

## 2022-02-10 VITALS — BP 116/70 | HR 54 | Resp 20 | Ht 63.0 in | Wt 253.6 lb

## 2022-02-10 DIAGNOSIS — I480 Paroxysmal atrial fibrillation: Secondary | ICD-10-CM

## 2022-02-10 DIAGNOSIS — L409 Psoriasis, unspecified: Secondary | ICD-10-CM

## 2022-02-10 DIAGNOSIS — Z1211 Encounter for screening for malignant neoplasm of colon: Secondary | ICD-10-CM

## 2022-02-10 DIAGNOSIS — Z0000 Encounter for general adult medical examination without abnormal findings: Secondary | ICD-10-CM

## 2022-02-10 DIAGNOSIS — R739 Hyperglycemia, unspecified: Secondary | ICD-10-CM | POA: Diagnosis not present

## 2022-02-10 DIAGNOSIS — G4733 Obstructive sleep apnea (adult) (pediatric): Secondary | ICD-10-CM | POA: Diagnosis not present

## 2022-02-10 DIAGNOSIS — E6609 Other obesity due to excess calories: Secondary | ICD-10-CM | POA: Diagnosis not present

## 2022-02-10 DIAGNOSIS — M810 Age-related osteoporosis without current pathological fracture: Secondary | ICD-10-CM

## 2022-02-10 DIAGNOSIS — E559 Vitamin D deficiency, unspecified: Secondary | ICD-10-CM | POA: Diagnosis not present

## 2022-02-10 DIAGNOSIS — C44320 Squamous cell carcinoma of skin of unspecified parts of face: Secondary | ICD-10-CM | POA: Diagnosis not present

## 2022-02-10 DIAGNOSIS — I159 Secondary hypertension, unspecified: Secondary | ICD-10-CM

## 2022-02-10 DIAGNOSIS — I10 Essential (primary) hypertension: Secondary | ICD-10-CM | POA: Diagnosis not present

## 2022-02-10 DIAGNOSIS — Z789 Other specified health status: Secondary | ICD-10-CM | POA: Diagnosis not present

## 2022-02-10 DIAGNOSIS — E7800 Pure hypercholesterolemia, unspecified: Secondary | ICD-10-CM | POA: Diagnosis not present

## 2022-02-10 NOTE — Assessment & Plan Note (Signed)
Encouraged DASH or MIND diet, decrease po intake and increase exercise as tolerated. Needs 7-8 hours of sleep nightly. Avoid trans fats, eat small, frequent meals every 4-5 hours with lean proteins, complex carbs and healthy fats. Minimize simple carbs, high fat foods and processed foods 

## 2022-02-10 NOTE — Assessment & Plan Note (Signed)
Following with dermatology Dr Renda Rolls will see her soon in follow up ?

## 2022-02-10 NOTE — Assessment & Plan Note (Signed)
On Remicade and tolerating well ?

## 2022-02-10 NOTE — Assessment & Plan Note (Signed)
hgba1c acceptable, minimize simple carbs. Increase exercise as tolerated.  

## 2022-02-10 NOTE — Assessment & Plan Note (Signed)
Encourage heart healthy diet such as MIND or DASH diet, increase exercise, avoid trans fats, simple carbohydrates and processed foods, consider a krill or fish or flaxseed oil cap daily.  °

## 2022-02-10 NOTE — Assessment & Plan Note (Addendum)
Using CPAP nightly, Dr Elsworth Soho, Patricia Nettle, NP of pulmonology helps her, [place] ?

## 2022-02-10 NOTE — Assessment & Plan Note (Signed)
Does not tolerate statins.

## 2022-02-10 NOTE — Assessment & Plan Note (Signed)
Well controlled, no changes to meds. Encouraged heart healthy diet such as the DASH diet and exercise as tolerated.  °

## 2022-02-10 NOTE — Assessment & Plan Note (Addendum)
Encouraged DASH or MIND diet, decrease po intake and increase exercise as tolerated. Needs 7-8 hours of sleep nightly. Avoid trans fats, eat small, frequent meals every 4-5 hours with lean proteins, complex carbs and healthy fats. Minimize simple carbs, high fat foods and processed foods. Patient declines meds at this time ?

## 2022-02-10 NOTE — Patient Instructions (Addendum)
Shingrix is the new shingles shot, 2 shots over 2-6 months, confirm coverage with insurance and document, then can return here for shots with nurse appt or at pharmacy  ? ?Get a tetanus if you have an injury ? ? ?DASH Eating Plan ?DASH stands for Dietary Approaches to Stop Hypertension. The DASH eating plan is a healthy eating plan that has been shown to: ?Reduce high blood pressure (hypertension). ?Reduce your risk for type 2 diabetes, heart disease, and stroke. ?Help with weight loss. ?What are tips for following this plan? ?Reading food labels ?Check food labels for the amount of salt (sodium) per serving. Choose foods with less than 5 percent of the Daily Value of sodium. Generally, foods with less than 300 milligrams (mg) of sodium per serving fit into this eating plan. ?To find whole grains, look for the word "whole" as the first word in the ingredient list. ?Shopping ?Buy products labeled as "low-sodium" or "no salt added." ?Buy fresh foods. Avoid canned foods and pre-made or frozen meals. ?Cooking ?Avoid adding salt when cooking. Use salt-free seasonings or herbs instead of table salt or sea salt. Check with your health care provider or pharmacist before using salt substitutes. ?Do not fry foods. Cook foods using healthy methods such as baking, boiling, grilling, roasting, and broiling instead. ?Cook with heart-healthy oils, such as olive, canola, avocado, soybean, or sunflower oil. ?Meal planning ? ?Eat a balanced diet that includes: ?4 or more servings of fruits and 4 or more servings of vegetables each day. Try to fill one-half of your plate with fruits and vegetables. ?6-8 servings of whole grains each day. ?Less than 6 oz (170 g) of lean meat, poultry, or fish each day. A 3-oz (85-g) serving of meat is about the same size as a deck of cards. One egg equals 1 oz (28 g). ?2-3 servings of low-fat dairy each day. One serving is 1 cup (237 mL). ?1 serving of nuts, seeds, or beans 5 times each week. ?2-3  servings of heart-healthy fats. Healthy fats called omega-3 fatty acids are found in foods such as walnuts, flaxseeds, fortified milks, and eggs. These fats are also found in cold-water fish, such as sardines, salmon, and mackerel. ?Limit how much you eat of: ?Canned or prepackaged foods. ?Food that is high in trans fat, such as some fried foods. ?Food that is high in saturated fat, such as fatty meat. ?Desserts and other sweets, sugary drinks, and other foods with added sugar. ?Full-fat dairy products. ?Do not salt foods before eating. ?Do not eat more than 4 egg yolks a week. ?Try to eat at least 2 vegetarian meals a week. ?Eat more home-cooked food and less restaurant, buffet, and fast food. ?Lifestyle ?When eating at a restaurant, ask that your food be prepared with less salt or no salt, if possible. ?If you drink alcohol: ?Limit how much you use to: ?0-1 drink a day for women who are not pregnant. ?0-2 drinks a day for men. ?Be aware of how much alcohol is in your drink. In the [place] one drink equals one 12 oz bottle of beer (355 mL), one 5 oz glass of wine (148 mL), or one 1? oz glass of hard liquor (44 mL). ?General information ?Avoid eating more than 2,300 mg of salt a day. If you have hypertension, you may need to reduce your sodium intake to 1,500 mg a day. ?Work with your health care provider to maintain a healthy body weight or to lose weight. Ask what an ideal  weight is for you. ?Get at least 30 minutes of exercise that causes your heart to beat faster (aerobic exercise) most days of the week. Activities may include walking, swimming, or biking. ?Work with your health care provider or dietitian to adjust your eating plan to your individual calorie needs. ?What foods should I eat? ?Fruits ?All fresh, dried, or frozen fruit. Canned fruit in natural juice (without added sugar). ?Vegetables ?Fresh or frozen vegetables (raw, steamed, roasted, or grilled). Low-sodium or reduced-sodium tomato and vegetable  juice. Low-sodium or reduced-sodium tomato sauce and tomato paste. Low-sodium or reduced-sodium canned vegetables. ?Grains ?Whole-grain or whole-wheat bread. Whole-grain or whole-wheat pasta. Brown rice. [place]. Bulgur. Whole-grain and low-sodium cereals. Pita bread. Low-fat, low-sodium crackers. Whole-wheat flour tortillas. ?Meats and other proteins ?Skinless chicken or [place]. Ground chicken or [place]. Pork with fat trimmed off. Fish and seafood. Egg whites. Dried beans, peas, or lentils. Unsalted nuts, nut butters, and seeds. Unsalted canned beans. Lean cuts of beef with fat trimmed off. Low-sodium, lean precooked or cured meat, such as sausages or meat loaves. ?Dairy ?Low-fat (1%) or fat-free (skim) milk. Reduced-fat, low-fat, or fat-free cheeses. Nonfat, low-sodium ricotta or cottage cheese. Low-fat or nonfat yogurt. Low-fat, low-sodium cheese. ?Fats and oils ?Soft margarine without trans fats. Vegetable oil. Reduced-fat, low-fat, or light mayonnaise and salad dressings (reduced-sodium). Canola, safflower, olive, avocado, soybean, and sunflower oils. Avocado. ?Seasonings and condiments ?Herbs. Spices. Seasoning mixes without salt. ?Other foods ?Unsalted popcorn and pretzels. Fat-free sweets. ?The items listed above may not be a complete list of foods and beverages you can eat. Contact a dietitian for more information. ?What foods should I avoid? ?Fruits ?Canned fruit in a light or heavy syrup. Fried fruit. Fruit in cream or butter sauce. ?Vegetables ?Creamed or fried vegetables. Vegetables in a cheese sauce. Regular canned vegetables (not low-sodium or reduced-sodium). Regular canned tomato sauce and paste (not low-sodium or reduced-sodium). Regular tomato and vegetable juice (not low-sodium or reduced-sodium). Angie Fava. Olives. ?Grains ?Baked goods made with fat, such as croissants, muffins, or some breads. Dry pasta or rice meal packs. ?Meats and other proteins ?Fatty cuts of meat. Ribs. Fried meat.  Berniece Salines. Bologna, salami, and other precooked or cured meats, such as sausages or meat loaves. Fat from the back of a pig (fatback). Bratwurst. Salted nuts and seeds. Canned beans with added salt. Canned or smoked fish. Whole eggs or egg yolks. Chicken or [place] with skin. ?Dairy ?Whole or 2% milk, cream, and half-and-half. Whole or full-fat cream cheese. Whole-fat or sweetened yogurt. Full-fat cheese. Nondairy creamers. Whipped toppings. Processed cheese and cheese spreads. ?Fats and oils ?Butter. Stick margarine. Lard. Shortening. Ghee. Bacon fat. Tropical oils, such as coconut, palm kernel, or palm oil. ?Seasonings and condiments ?Onion salt, garlic salt, seasoned salt, table salt, and sea salt. Worcestershire sauce. Tartar sauce. Barbecue sauce. Teriyaki sauce. Soy sauce, including reduced-sodium. Steak sauce. Canned and packaged gravies. Fish sauce. Oyster sauce. Cocktail sauce. Store-bought horseradish. Ketchup. Mustard. Meat flavorings and tenderizers. Bouillon cubes. Hot sauces. Pre-made or packaged marinades. Pre-made or packaged taco seasonings. Relishes. Regular salad dressings. ?Other foods ?Salted popcorn and pretzels. ?The items listed above may not be a complete list of foods and beverages you should avoid. Contact a dietitian for more information. ?Where to find more information ?National Heart, Lung, and Blood Institute: https://wilson-eaton.com/ ?American Heart Association: www.heart.org ?Academy of Nutrition and Dietetics: www.eatright.org ?[place]: www.kidney.org ?Summary ?The DASH eating plan is a healthy eating plan that has been shown to reduce high blood pressure (  hypertension). It may also reduce your risk for type 2 diabetes, heart disease, and stroke. ?When on the DASH eating plan, aim to eat more fresh fruits and vegetables, whole grains, lean proteins, low-fat dairy, and heart-healthy fats. ?With the DASH eating plan, you should limit salt (sodium) intake to 2,300 mg a day. If  you have hypertension, you may need to reduce your sodium intake to 1,500 mg a day. ?Work with your health care provider or dietitian to adjust your eating plan to your individual calorie needs. ?This informat

## 2022-02-10 NOTE — Assessment & Plan Note (Signed)
Tolerating coumadin 

## 2022-02-10 NOTE — Assessment & Plan Note (Signed)
Supplement and monitor 

## 2022-02-11 LAB — COMPREHENSIVE METABOLIC PANEL
ALT: 16 U/L (ref 0–35)
AST: 20 U/L (ref 0–37)
Albumin: 3.7 g/dL (ref 3.5–5.2)
Alkaline Phosphatase: 74 U/L (ref 39–117)
BUN: 15 mg/dL (ref 6–23)
CO2: 25 mEq/L (ref 19–32)
Calcium: 8.8 mg/dL (ref 8.4–10.5)
Chloride: 104 mEq/L (ref 96–112)
Creatinine, Ser: 1.1 mg/dL (ref 0.40–1.20)
GFR: 48.82 mL/min — ABNORMAL LOW (ref >60.00)
Glucose, Bld: 93 mg/dL (ref 70–99)
Potassium: 4.6 mEq/L (ref 3.5–5.1)
Sodium: 139 mEq/L (ref 135–145)
Total Bilirubin: 0.5 mg/dL (ref 0.2–1.2)
Total Protein: 7 g/dL (ref 6.0–8.3)

## 2022-02-11 LAB — LIPID PANEL
Cholesterol: 155 mg/dL (ref 0–200)
HDL: 50.1 mg/dL (ref >39.00)
LDL Cholesterol: 86 mg/dL (ref 0–99)
NonHDL: 104.96
Total CHOL/HDL Ratio: 3
Triglycerides: 93 mg/dL (ref 0.0–149.0)
VLDL: 18.6 mg/dL (ref 0.0–40.0)

## 2022-02-11 LAB — CBC
HCT: 38.5 % (ref 36.0–46.0)
Hemoglobin: 12.5 g/dL (ref 12.0–15.0)
MCHC: 32.6 g/dL (ref 30.0–36.0)
MCV: 101.5 fl — ABNORMAL HIGH (ref 78.0–100.0)
Platelets: 207 10*3/uL (ref 150.0–400.0)
RBC: 3.79 Mil/uL — ABNORMAL LOW (ref 3.87–5.11)
RDW: 15.6 % — ABNORMAL HIGH (ref 11.5–15.5)
WBC: 7.2 10*3/uL (ref 4.0–10.5)

## 2022-02-11 LAB — TSH: TSH: 3.2 u[IU]/mL (ref 0.35–5.50)

## 2022-02-11 LAB — HEMOGLOBIN A1C: Hgb A1c MFr Bld: 5.8 % (ref 4.6–6.5)

## 2022-02-13 NOTE — Assessment & Plan Note (Signed)
Well controlled, no changes to meds. Encouraged heart healthy diet such as the DASH diet and exercise as tolerated.  °

## 2022-02-19 ENCOUNTER — Other Ambulatory Visit: Payer: Self-pay | Admitting: Cardiology

## 2022-02-21 ENCOUNTER — Other Ambulatory Visit (INDEPENDENT_AMBULATORY_CARE_PROVIDER_SITE_OTHER): Payer: Medicare Other

## 2022-02-21 DIAGNOSIS — Z1211 Encounter for screening for malignant neoplasm of colon: Secondary | ICD-10-CM | POA: Diagnosis not present

## 2022-02-21 MED ORDER — VALSARTAN 160 MG PO TABS: 160.0000 mg | ORAL_TABLET | Freq: Every day | ORAL | 3 refills | Status: DC

## 2022-02-21 NOTE — Telephone Encounter (Signed)
New script sent to the pharmacy

## 2022-02-22 ENCOUNTER — Other Ambulatory Visit: Payer: Self-pay

## 2022-02-22 ENCOUNTER — Telehealth: Payer: Self-pay | Admitting: *Deleted

## 2022-02-22 ENCOUNTER — Encounter: Payer: Self-pay | Admitting: Physician Assistant

## 2022-02-22 DIAGNOSIS — K921 Melena: Secondary | ICD-10-CM

## 2022-02-22 LAB — FECAL OCCULT BLOOD, IMMUNOCHEMICAL: Fecal Occult Bld: POSITIVE — AB

## 2022-02-22 NOTE — Telephone Encounter (Signed)
Pt advised. Colonoscopy referral placed. ?

## 2022-02-22 NOTE — Telephone Encounter (Signed)
Received call from Laurie at [facility] reporting positive IFOB. 

## 2022-02-23 ENCOUNTER — Encounter: Payer: Self-pay | Admitting: Sports Medicine

## 2022-02-23 ENCOUNTER — Ambulatory Visit (INDEPENDENT_AMBULATORY_CARE_PROVIDER_SITE_OTHER): Payer: Medicare Other | Admitting: Sports Medicine

## 2022-02-23 DIAGNOSIS — B351 Tinea unguium: Secondary | ICD-10-CM

## 2022-02-23 DIAGNOSIS — Z7901 Long term (current) use of anticoagulants: Secondary | ICD-10-CM

## 2022-02-23 DIAGNOSIS — I739 Peripheral vascular disease, unspecified: Secondary | ICD-10-CM

## 2022-02-23 DIAGNOSIS — M79675 Pain in left toe(s): Secondary | ICD-10-CM | POA: Diagnosis not present

## 2022-02-23 DIAGNOSIS — M79674 Pain in right toe(s): Secondary | ICD-10-CM

## 2022-02-23 NOTE — Progress Notes (Signed)
Subjective: ?[name] is a 76 y.o. female patient seen today in office with complaint of mildly painful thickened and elongated toenails; unable to trim. Patient reports no changes with medications since last visit and states that she saw the dermatologist the other day as well.   ? ?Currently anticoagulated for A. Fib as previously noted.  History of rheumatoid arthritis. ? ?[name] Lukes, MD, last visit April 2023 ? ?Patient Active Problem List  ? Diagnosis Date Noted  ? Abnormal gait 08/18/2021  ? Crohn's disease ([place]) 08/18/2021  ? History of malignant neoplasm of skin 08/18/2021  ? 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 Uveitis 08/18/2021  ? Psoriasis 08/04/2021  ? Vitamin D deficiency 08/04/2021  ? Blepharitis 11/17/2019  ? Hip pain, chronic, left 12/03/2018  ? Dry eyes 08/05/2018  ? Onychomycosis 06/03/2018  ? Renal oncocytoma   ? 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 Paroxysmal atrial fibrillation ([facility])   ? Pancreatitis   ? Hypertension   ? GERD (gastroesophageal reflux disease)   ? CAD (coronary artery disease)   ? Arthritis   ? Anxiety   ? Rheumatoid arthritis ([place]) 05/29/2017  ? SCC (squamous cell carcinoma), 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  ? Dry mouth 08/22/2016  ? Cerebrovascular disease 07/30/2014  ? Sinusitis 06/30/2014  ? Encounter for therapeutic drug monitoring 06/09/2014  ? Pain in joint of right shoulder 04/21/2014  ? Other acne 04/21/2014  ? Intertrigo 11/12/2013  ? Keloid of skin   ? Bilateral leg edema 09/12/2012  ? IBS (irritable bowel syndrome) 08/21/2012  ? Medicare annual wellness visit, subsequent 08/17/2012  ? Radicular low back pain 06/28/2012  ? Basal cell carcinoma 02/07/2012  ? Maxillary sinus cyst 02/07/2012  ? Statin intolerance 02/28/2011  ? Crohn's colitis ([place])   ? 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  ? CAROTID BRUIT 12/10/2009  ? Hyperglycemia 07/02/2009  ? HEADACHE 02/24/2009  ? ATRIAL FIBRILLATION 01/15/2009  ? Obesity 01/03/2008  ? OSA  (obstructive sleep apnea) 01/03/2008  ? Osteoporosis 11/05/2007  ? Hyperlipidemia 09/06/2007  ? DEPRESSION/ANXIETY 09/06/2007  ? Hearing loss 09/06/2007  ? Essential hypertension 09/06/2007  ? MYOCARDIAL INFARCTION, HX OF 09/06/2007  ? Coronary atherosclerosis 09/06/2007  ? ALLERGIC RHINITIS 09/06/2007  ? COLONIC POLYPS, ADENOMATOUS, HX OF 07/04/2007  ? ? ?Current Outpatient Medications on File Prior to Visit  ?Medication Sig Dispense Refill  ? amLODipine (NORVASC) 10 MG tablet TAKE 1 TABLET BY MOUTH EVERY DAY 90 tablet 2  ? dextrose 5 % SOLN 50 mL with methotrexate 25 MG/ML (PF) SOLN 40 mg/m2 Inject 40 mg/m2 into the vein once a week. Every wednesday    ? dofetilide (TIKOSYN) 125 MCG capsule TAKE 3 CAPSULES BY MOUTH TWICE A DAY 540 capsule 1  ? doxazosin (CARDURA) 2 MG tablet TAKE 1 TABLET BY MOUTH EVERY DAY 90 tablet 2  ? escitalopram (LEXAPRO) 20 MG tablet TAKE 1 TABLET BY MOUTH EVERY DAY 90 tablet 2  ? esomeprazole (NEXIUM) 40 MG capsule Take 1 capsule (40 mg total) by mouth daily. Take in the morning before breakfast. 30 capsule 11  ? folic acid (FOLVITE) 1 MG tablet Take 2 mg by mouth daily.    ? InFLIXimab (REMICADE IV) Inject into the vein as directed.    ? inFLIXimab 10 mg/kg in sodium chloride 0.9 % Inject 10 mg/kg into the vein every 6 (six) weeks.    ? loperamide (IMODIUM A-D) 2 MG tablet Take 1 mg by mouth as needed. Dihrrhea    ?  metoprolol tartrate (LOPRESSOR) 25 MG tablet TAKE 1 TABLET (25 MG TOTAL) BY MOUTH IN THE MORNING AND AT BEDTIME. 180 tablet 3  ? Multiple Vitamin (MULTIVITAMIN) capsule Take 1 capsule by mouth daily. 1 daily    ? nitroGLYCERIN (NITROSTAT) 0.4 MG SL tablet Place 1 tablet (0.4 mg total) under the tongue every 5 (five) minutes as needed for chest pain. Do not exceed 3 tabs in 15 minutes 75 tablet 3  ? Probiotic Product ([place]) CAPS Take 1 capsule by mouth daily. 1 per day    ? TUBERCULIN SYR 1CC/27GX1/2" 27G X 1/2" 1 ML MISC     ? valsartan (DIOVAN) 160 MG tablet  TAKE 1 TABLET BY MOUTH EVERY DAY 90 tablet 1  ? valsartan (DIOVAN) 160 MG tablet Take 1 tablet (160 mg total) by mouth daily. 90 tablet 3  ? warfarin (COUMADIN) 2.5 MG tablet TAKE 1/2 TO 1 TABLET DAILY AS DIRECTED BY COUMADIN CLINIC 90 tablet 0  ? ?No current facility-administered medications on file prior to visit.  ? ? ?Allergies  ?Allergen Reactions  ? Hydralazine Diarrhea and Nausea Only  ?  Anorexia, upset stomach, burning pain in abdomin  ? Cefdinir   ?  Pt cannot take; may interfere with AFib.  ? Codeine   ?  Heart beats fast ?  ? Fosamax [Alendronate Sodium] Other (See Comments)  ?  GI upset  ? Guaifenesin Er Nausea And Vomiting and Other (See Comments)  ?  Headaches; can tolerate liquid.  ? Latex Other (See Comments)  ?  Breathing problems  ? Mucilgen [Psyllium]   ? Percocet [Oxycodone-Acetaminophen]   ?  Itching & swelling in jaw  ? ? ?Objective: ?Physical Exam ? ?General: Well developed, nourished, no acute distress, awake, alert and oriented x 3 ? ?Vascular: Dorsalis pedis artery 1/4 bilateral, Posterior tibial artery 0/4 bilateral, skin temperature warm to warm proximal to distal bilateral lower extremities, mild varicosities, pedal hair present bilateral.  Trace edema to ankles pitting in nature nonpainful. ? ?Neurological: Gross sensation present via light touch bilateral.  ? ?Dermatological: Skin is warm, dry, and supple bilateral, Nails 1-10 are tender, long, thick, and discolored with mild subungal debris, no webspace macerations present bilateral, no acute signs of infection, no open lesions present bilateral, no callus/corns/hyperkeratotic tissue present bilateral. No signs of infection bilateral. ? ?Musculoskeletal: No symptomatic boney deformities noted bilateral. Muscular strength within normal limits without painon range of motion. No pain with calf compression bilateral. ? ?Assessment and Plan:  ?Problem List Items Addressed This Visit   ?None ?Visit Diagnoses   ? ? Pain due to onychomycosis  of toenails of both feet    -  Primary  ? PVD (peripheral vascular disease) ([place])      ? Current use of anticoagulant therapy      ? ?  ? ? ?-Examined patient.  ?-Discussed treatment options for painful mycotic nails. ?-Mechanically debrided and reduced all painful mycotic nails with sterile nail nipper and dremel nail file without incident. ?-Encouraged elevation to assist with edema control like before ?-Patient to return in 3 months for follow up evaluation or sooner if symptoms worsen. ? ?Landis Martins, DPM  ?

## 2022-03-04 ENCOUNTER — Other Ambulatory Visit: Payer: Self-pay | Admitting: Cardiology

## 2022-03-08 ENCOUNTER — Ambulatory Visit (INDEPENDENT_AMBULATORY_CARE_PROVIDER_SITE_OTHER): Payer: Medicare Other

## 2022-03-08 DIAGNOSIS — Z5181 Encounter for therapeutic drug level monitoring: Secondary | ICD-10-CM

## 2022-03-08 DIAGNOSIS — I4891 Unspecified atrial fibrillation: Secondary | ICD-10-CM | POA: Diagnosis not present

## 2022-03-08 LAB — POCT INR: INR: 1.6 — AB (ref 2.0–3.0)

## 2022-03-08 NOTE — Patient Instructions (Addendum)
Description   ?Today, take 1 tablet and then START taking 1 tablet daily except 0.5 tablet on Sundays, Tuesdays, Thursdays.  ? Repeat INR in 1 week.   ?  ?   ?

## 2022-03-15 ENCOUNTER — Ambulatory Visit (INDEPENDENT_AMBULATORY_CARE_PROVIDER_SITE_OTHER): Payer: Medicare Other

## 2022-03-15 DIAGNOSIS — Z5181 Encounter for therapeutic drug level monitoring: Secondary | ICD-10-CM

## 2022-03-15 DIAGNOSIS — I4891 Unspecified atrial fibrillation: Secondary | ICD-10-CM

## 2022-03-15 LAB — POCT INR: INR: 1.9 — AB (ref 2.0–3.0)

## 2022-03-15 NOTE — Patient Instructions (Signed)
TAKE 1 TABLET TODAY ONLY and then START taking 1 tablet daily except 0.5 tablet Tuesdays and Thursdays.   Repeat INR in 3 weeks.

## 2022-03-17 ENCOUNTER — Other Ambulatory Visit (INDEPENDENT_AMBULATORY_CARE_PROVIDER_SITE_OTHER): Payer: Medicare Other

## 2022-03-17 ENCOUNTER — Encounter: Payer: Self-pay | Admitting: Physician Assistant

## 2022-03-17 ENCOUNTER — Ambulatory Visit (INDEPENDENT_AMBULATORY_CARE_PROVIDER_SITE_OTHER): Payer: Medicare Other | Admitting: Physician Assistant

## 2022-03-17 VITALS — BP 118/66 | HR 46 | Ht 62.5 in | Wt 250.6 lb

## 2022-03-17 DIAGNOSIS — G8929 Other chronic pain: Secondary | ICD-10-CM

## 2022-03-17 DIAGNOSIS — R109 Unspecified abdominal pain: Secondary | ICD-10-CM

## 2022-03-17 DIAGNOSIS — R195 Other fecal abnormalities: Secondary | ICD-10-CM

## 2022-03-17 DIAGNOSIS — Z8719 Personal history of other diseases of the digestive system: Secondary | ICD-10-CM | POA: Diagnosis not present

## 2022-03-17 LAB — C-REACTIVE PROTEIN: CRP: <1 mg/dL (ref 0.5–20.0)

## 2022-03-17 LAB — CBC WITH DIFFERENTIAL/PLATELET
Basophils Absolute: 0 10*3/uL (ref 0.0–0.1)
Basophils Relative: 0.5 % (ref 0.0–3.0)
Eosinophils Absolute: 0.1 10*3/uL (ref 0.0–0.7)
Eosinophils Relative: 1.9 % (ref 0.0–5.0)
HCT: 38.6 % (ref 36.0–46.0)
Hemoglobin: 12.9 g/dL (ref 12.0–15.0)
Lymphocytes Relative: 22.3 % (ref 12.0–46.0)
Lymphs Abs: 1.5 10*3/uL (ref 0.7–4.0)
MCHC: 33.3 g/dL (ref 30.0–36.0)
MCV: 98.7 fl (ref 78.0–100.0)
Monocytes Absolute: 0.6 10*3/uL (ref 0.1–1.0)
Monocytes Relative: 8.5 % (ref 3.0–12.0)
Neutro Abs: 4.5 10*3/uL (ref 1.4–7.7)
Neutrophils Relative %: 66.8 % (ref 43.0–77.0)
Platelets: 205 10*3/uL (ref 150.0–400.0)
RBC: 3.91 Mil/uL (ref 3.87–5.11)
RDW: 15.4 % (ref 11.5–15.5)
WBC: 6.7 10*3/uL (ref 4.0–10.5)

## 2022-03-17 LAB — BASIC METABOLIC PANEL
BUN: 18 mg/dL (ref 6–23)
CO2: 26 mEq/L (ref 19–32)
Calcium: 9.1 mg/dL (ref 8.4–10.5)
Chloride: 105 mEq/L (ref 96–112)
Creatinine, Ser: 1.15 mg/dL (ref 0.40–1.20)
GFR: 46.25 mL/min — ABNORMAL LOW (ref >60.00)
Glucose, Bld: 95 mg/dL (ref 70–99)
Potassium: 4.2 mEq/L (ref 3.5–5.1)
Sodium: 139 mEq/L (ref 135–145)

## 2022-03-17 NOTE — Progress Notes (Signed)
Subjective:    Patient ID: [name], female    DOB: [birth year], 76 y.o.   MRN: [id number]  HPI  [name] is a pleasant 76 year old white female, established with Dr. Carlean Purl.  She was last seen here in March 2019 by myself.  She does have previous history of Crohn's disease which has been in remission for a long time, and is maintained on Remicade over the past several years for rheumatoid arthritis.  She last had colonoscopy in 2013 with finding of 3 adenomatous polyps, severe diverticulosis and no evidence of active Crohn's.  When she was seen here in 2019 she was fairly adamant that she did not ever want to have another colonoscopy, as she is afraid of complications because she has had complications with other procedures and surgeries in the past. She is referred back today by primary care/Dr. Charlett Blake after annual labs and Hemoccults were done with finding of Hemoccult positive stool. On 02/10/2022 hemoglobin 12.5/hematocrit 38.5 MCV 101/platelets 207  Patient is on Coumadin for atrial fibrillation, she says she has not noted any melena or hematochezia herself.  She has not had any recent changes in bowel habits, has constant alternating bowel habits with formed and looser stools.  She says about 6 weeks ago she did have a spell with bad gas and malodorous gas that has resolved.  She has left-sided mid abdominal discomfort which is constant and has been present for many years. Other core morbidities include hypertension, coronary artery disease, prior MI, atrial fibrillation with chronic anticoagulation, sleep apnea without oxygen use positive CPAP Obesity, Status post right nephrectomy and cholecystectomy and chronic GERD.  Review of Systems.Pertinent positive and negative review of systems were noted in the above HPI section.  All other review of systems was otherwise negative.   Outpatient Encounter Medications as of 03/17/2022  Medication Sig   amLODipine (NORVASC) 10 MG tablet TAKE 1  TABLET BY MOUTH EVERY DAY   dextrose 5 % SOLN 50 mL with methotrexate 25 MG/ML (PF) SOLN 40 mg/m2 Inject 40 mg/m2 into the vein once a week. Every wednesday   dofetilide (TIKOSYN) 125 MCG capsule TAKE 3 CAPSULES BY MOUTH TWICE A DAY   doxazosin (CARDURA) 2 MG tablet TAKE 1 TABLET BY MOUTH EVERY DAY   escitalopram (LEXAPRO) 20 MG tablet TAKE 1 TABLET BY MOUTH EVERY DAY   esomeprazole (NEXIUM) 40 MG capsule Take 1 capsule (40 mg total) by mouth daily. Take in the morning before breakfast.   folic acid (FOLVITE) 1 MG tablet Take 2 mg by mouth daily.   InFLIXimab (REMICADE IV) Inject into the vein as directed.   inFLIXimab 10 mg/kg in sodium chloride 0.9 % Inject 10 mg/kg into the vein every 6 (six) weeks.   loperamide (IMODIUM A-D) 2 MG tablet Take 1 mg by mouth as needed. Dihrrhea   metoprolol tartrate (LOPRESSOR) 25 MG tablet TAKE 1 TABLET (25 MG TOTAL) BY MOUTH IN THE MORNING AND AT BEDTIME.   Multiple Vitamin (MULTIVITAMIN) capsule Take 1 capsule by mouth daily. 1 daily   nitroGLYCERIN (NITROSTAT) 0.4 MG SL tablet Place 1 tablet (0.4 mg total) under the tongue every 5 (five) minutes as needed for chest pain. Do not exceed 3 tabs in 15 minutes   Probiotic Product ([place]) CAPS Take 1 capsule by mouth daily. 1 per day   TUBERCULIN SYR 1CC/27GX1/2" 27G X 1/2" 1 ML MISC    valsartan (DIOVAN) 160 MG tablet Take 1 tablet (160 mg total) by mouth daily.  warfarin (COUMADIN) 2.5 MG tablet TAKE 1/2 TO 1 TABLET DAILY AS DIRECTED BY COUMADIN CLINIC   [DISCONTINUED] valsartan (DIOVAN) 160 MG tablet TAKE 1 TABLET BY MOUTH EVERY DAY   No facility-administered encounter medications on file as of 03/17/2022.   Allergies  Allergen Reactions   Hydralazine Diarrhea and Nausea Only    Anorexia, upset stomach, burning pain in abdomin   Cefdinir     Pt cannot take; may interfere with AFib.   Codeine     Heart beats fast    Fosamax [Alendronate Sodium] Other (See Comments)    GI upset    Guaifenesin Er Nausea And Vomiting and Other (See Comments)    Headaches; can tolerate liquid.   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   Uveitis 08/18/2021   Psoriasis 08/04/2021   Vitamin D deficiency 08/04/2021   Blepharitis 11/17/2019   Hip pain, chronic, left 12/03/2018   Dry eyes 08/05/2018   Onychomycosis 06/03/2018   Renal oncocytoma    Pseudoaneurysm of right femoral artery (HCC)    Paroxysmal atrial fibrillation (HCC)    Pancreatitis    Hypertension    GERD (gastroesophageal reflux disease)    CAD (coronary artery disease)    Arthritis    Anxiety    Rheumatoid arthritis ([place]) 05/29/2017   SCC (squamous cell carcinoma), 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   Dry mouth 08/22/2016   Cerebrovascular disease 07/30/2014   Sinusitis 06/30/2014   Encounter for therapeutic drug monitoring 06/09/2014   Pain in joint of right shoulder 04/21/2014   Other acne 04/21/2014   Intertrigo 11/12/2013   Keloid of skin    Bilateral leg edema 09/12/2012   IBS (irritable bowel syndrome) 08/21/2012   Medicare annual wellness visit, subsequent 08/17/2012   Radicular low back pain 06/28/2012   Basal cell carcinoma 02/07/2012   Maxillary sinus cyst 02/07/2012   Statin intolerance 02/28/2011   Crohn's colitis ([place])    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   CAROTID BRUIT 12/10/2009   Hyperglycemia 07/02/2009   HEADACHE 02/24/2009   ATRIAL FIBRILLATION 01/15/2009   Obesity 01/03/2008   OSA (obstructive sleep apnea) 01/03/2008   Osteoporosis 11/05/2007   Hyperlipidemia 09/06/2007   DEPRESSION/ANXIETY 09/06/2007   Hearing loss 09/06/2007   Essential hypertension 09/06/2007    MYOCARDIAL INFARCTION, HX OF 09/06/2007   Coronary atherosclerosis 09/06/2007   ALLERGIC RHINITIS 09/06/2007   COLONIC POLYPS, ADENOMATOUS, HX OF 07/04/2007   Social History   Socioeconomic History   Marital status: Married    Spouse name: Not on file   Number of children: 3   Years of education: Not on file   Highest education level: Not on file  Occupational History   Occupation: Retired    Fish farm manager: RETIRED    Comment: retired  Tobacco Use   Smoking status: Never   Smokeless tobacco: Never  Vaping Use   Vaping Use: Never used  Substance and Sexual Activity   Alcohol use: No   Drug use: No   Sexual activity: Never  Other Topics Concern   Not on file  Social History Narrative   Married since 1965    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,  substitute teacher)   3 children - adults, 2 grandchildren   No pets   Social Determinants of Radio broadcast assistant Strain: Low Risk    Difficulty of Paying Living Expenses: Not hard at all  Food Insecurity: No Food Insecurity   Worried About Charity fundraiser in the Last Year: Never true   Arboriculturist in the Last Year: Never true  Transportation Needs: No Transportation Needs   Lack of Transportation (Medical): No   Lack of Transportation (Non-Medical): No  Physical Activity: Inactive   Days of Exercise per Week: 0 days   Minutes of Exercise per Session: 0 min  Stress: No Stress Concern Present   Feeling of Stress : Not at all  Social Connections: Moderately Isolated   Frequency of Communication with Friends and Family: More than three times a week   Frequency of Social Gatherings with Friends and Family: More than three times a week   Attends Religious Services: Never   Marine scientist or Organizations: No   Attends Archivist Meetings: Never   Marital Status: Married  Human resources officer Violence: Not At Risk   Fear of Current or Ex-Partner: No   Emotionally Abused: No   Physically Abused: No    Sexually Abused: No    Ms. Diers's family history includes Allergies in her mother; Asthma in her daughter and maternal uncle; Clotting disorder in her brother, mother, and sister; Colitis in her father; Colon cancer in her paternal aunt; Coronary artery disease in her mother; Heart disease in her father and mother; Hypertension in her mother; Stroke in her brother and mother.      Objective:    Vitals:   03/17/22 0913  BP: 118/66  Pulse: (!) 46  SpO2: 97%    Physical Exam Well-developed well-nourished elderly white female in no acute distress.  Height, Weight, 250 BMI 45.1  HEENT; nontraumatic normocephalic, EOMI, PE R LA, sclera anicteric. Oropharynx; not examined today Neck; supple, no JVD Cardiovascular; regular rate and rhythm with S1-S2, no murmur rub or gallop Pulmonary; Clear bilaterally Abdomen; soft, obese, large transverse upper abdominal incisional scar nondistended, no focal tenderness no palpable mass or hepatosplenomegaly, bowel sounds are active Rectal; not done today Skin; benign exam, no jaundice rash or appreciable lesions Extremities; no clubbing cyanosis or edema skin warm and dry Neuro/Psych; alert and oriented x4, grossly nonfocal mood and affect appropriate        Assessment & Plan:   #25 76 year old white female referred with Hemoccult positive stool d done for routine raining by PCP.  This is in the setting of chronic Coumadin use, and prior history of adenomatous colon polyps as well as Crohn's disease which has been in remission for many years.  She is on chronic Remicade for rheumatoid arthritis.  Patient has not noted any melena or hematochezia, she has not had any increase in abdominal discomfort over the past several months, and no changes in her usual bowel habits.  No anemia  Etiology of the heme positive stool is not clear, she is overdue for colonoscopy-last done 2013 but has been extremely reluctant to undergo further colonoscopies due  to history of multiple complications with procedures and surgeries in the past (not directly related to colonoscopy)  Rule out mild activation of Crohn's, rule out colonic neoplasm, rule out polyps, rule out AVMs  #2 chronic GERD stable #3 coronary artery disease status post MI #4.  Status post right nephrectomy and cholecystectomy #5.  Atrial fibrillation-on chronic Coumadin #6.  Hypertension #7.  Morbid obesity-BMI 45 #8.  Sleep apnea no oxygen use  #9  Rheumatoid arthritis  Plan; discussion today with the patient regarding options for work-up.  She would like to avoid colonoscopy if at all possible and would like to pursue noninvasive options first.   will repeat CBC today, check be met, CRP, fecal calprotectin  Patient will be scheduled for CT of the abdomen and pelvis with contrast  Further recommendations pending results of above. Patient says if any positive findings with the above work-up she would be agreeable to colonoscopy.  Anali Cabanilla Genia Harold PA-C 03/17/2022   Cc: [name], MD

## 2022-03-17 NOTE — Patient Instructions (Signed)
If you are age 77 or older, your body mass index should be between 23-30. Your Body mass index is 45.1 kg/m. If this is out of the aforementioned range listed, please consider follow up with your Primary Care Provider. ________________________________________________________  The [facility] providers would like to encourage you to use [facility] to communicate with providers for non-urgent requests or questions.  Due to long hold times on the telephone, sending your provider a message by [facility] may be a faster and more efficient way to get a response.  Please allow 48 business hours for a response.  Please remember that this is for non-urgent requests.  _______________________________________________________  Your provider has requested that you go to the basement level for lab work before leaving today. Press "B" on the elevator. The lab is located at the first door on the left as you exit the elevator.  You have been scheduled for a CT scan of the abdomen and pelvis at [facility], 1st floor Radiology. You are scheduled on 04/01/2022  at 9:30 am. You should arrive 15 minutes prior to your appointment time for registration.  Please pick up 2 bottles of contrast from [place] at least 3 days prior to your scan. The solution may taste better if refrigerated, but do NOT add ice or any other liquid to this solution. Shake well before drinking.   Please follow the written instructions below on the day of your exam:   1) Do not eat anything after 5:30 am (4 hours prior to your test)   2) Drink 1 bottle of contrast @ 7:30 am (2 hours prior to your exam)  Remember to shake well before drinking and do NOT pour over ice.     Drink 1 bottle of contrast @ 8:30 am (1 hour prior to your exam)   You may take any medications as prescribed with a small amount of water, if necessary. If you take any of the following medications: METFORMIN, GLUCOPHAGE, GLUCOVANCE, AVANDAMET, RIOMET, FORTAMET, [place] MET,  JANUMET, GLUMETZA or METAGLIP, you MAY be asked to HOLD this medication 48 hours AFTER the exam.   The purpose of you drinking the oral contrast is to aid in the visualization of your intestinal tract. The contrast solution may cause some diarrhea. Depending on your individual set of symptoms, you may also receive an intravenous injection of x-ray contrast/dye. Plan on being at [facility] for 45 minutes or longer, depending on the type of exam you are having performed.   If you have any questions regarding your exam or if you need to reschedule, you may call [facility] at [phone] between the hours of 8:00 am and 5:00 pm, Monday-Friday.   Follow up pending the results of your CT.  Thank you for entrusting me with your care and choosing [facility].  Amy Esterwood, PA-C

## 2022-03-22 ENCOUNTER — Telehealth: Payer: Self-pay | Admitting: Cardiology

## 2022-03-22 DIAGNOSIS — M0589 Other rheumatoid arthritis with rheumatoid factor of multiple sites: Secondary | ICD-10-CM | POA: Diagnosis not present

## 2022-03-22 NOTE — Telephone Encounter (Signed)
Pt returned to normal rhythm this morning. Requested Afib clinic appt be canceled. Pt will call if need arises.

## 2022-03-22 NOTE — Telephone Encounter (Signed)
Patient c/o Palpitations:  High priority if patient c/o lightheadedness, shortness of breath, or chest pain  How long have you had palpitations/irregular HR/ Afib? Are you having the symptoms now? Yes- been n Afib since Saturday  Are you currently experiencing lightheadedness, SOB or CP? no  Do you have a history of afib (atrial fibrillation) or irregular heart rhythm? yes  Have you checked your BP or HR? (document readings if available): good- heart rates is in the 90's  Are you experiencing any other symptoms?   Very tired

## 2022-03-22 NOTE — Telephone Encounter (Signed)
Spoke with patient of [name]. She reports being in AFib since Saturday AM. She feels OK, just some fatigue. She did previously have some "woozy" spells upon standing but this has improved. No SOB or chest pains.  She reports missing 1 dose of Tikosyn Friday at 7pm. She took nighttime med that she missed on Satruday AM and then spread out Saturday's dosing about every 8-9 hours, so she took 3 doses on Saturday. She takes metoprolol and warfarin - no missed doses.   Her HR is in the 90s. Her BP is "controlled"   She is scheduled for a Remicaid infusion this afternoon - she spoke with infusion clinic and was told OK to proceed with this as scheduled -- would like MD advice on this also.   Advised will send message to MD to review

## 2022-03-22 NOTE — Telephone Encounter (Signed)
Spoke with pt, Aware of dr crenshaw's recommendations.  Follow up scheduled  

## 2022-03-23 ENCOUNTER — Other Ambulatory Visit: Payer: Medicare Other

## 2022-03-23 ENCOUNTER — Ambulatory Visit (HOSPITAL_COMMUNITY): Payer: Medicare Other | Admitting: Physician Assistant

## 2022-03-23 DIAGNOSIS — Z8719 Personal history of other diseases of the digestive system: Secondary | ICD-10-CM | POA: Diagnosis not present

## 2022-03-23 DIAGNOSIS — R195 Other fecal abnormalities: Secondary | ICD-10-CM | POA: Diagnosis not present

## 2022-03-23 DIAGNOSIS — G8929 Other chronic pain: Secondary | ICD-10-CM | POA: Diagnosis not present

## 2022-03-23 DIAGNOSIS — R109 Unspecified abdominal pain: Secondary | ICD-10-CM | POA: Diagnosis not present

## 2022-03-24 ENCOUNTER — Other Ambulatory Visit: Payer: Self-pay | Admitting: Family Medicine

## 2022-03-28 ENCOUNTER — Telehealth: Payer: Self-pay | Admitting: Physician Assistant

## 2022-03-28 NOTE — Telephone Encounter (Signed)
Spoke with the patient. Reporting blood on the tissue after a "fairly firm bowel movement." No blood in toilet or with the stool. States "always a little aching in my abdomen but not today."  She has turned in the stool specimen and her CT is 04/01/22. Any recommendations or changes in the plan of care?

## 2022-03-28 NOTE — Telephone Encounter (Signed)
Patient called wanted to let [facility] nurse know that she is now seeing rectal bleeding seeking advise.

## 2022-03-28 NOTE — Telephone Encounter (Signed)
Message to the patient through My Chart. Confirmed she uses it. She is at an anniversary celebration.

## 2022-03-30 DIAGNOSIS — M65322 Trigger finger, left index finger: Secondary | ICD-10-CM | POA: Diagnosis not present

## 2022-03-30 DIAGNOSIS — M7989 Other specified soft tissue disorders: Secondary | ICD-10-CM | POA: Diagnosis not present

## 2022-03-30 DIAGNOSIS — K5090 Crohn's disease, unspecified, without complications: Secondary | ICD-10-CM | POA: Diagnosis not present

## 2022-03-30 DIAGNOSIS — Q600 Renal agenesis, unilateral: Secondary | ICD-10-CM | POA: Diagnosis not present

## 2022-03-30 DIAGNOSIS — M0589 Other rheumatoid arthritis with rheumatoid factor of multiple sites: Secondary | ICD-10-CM | POA: Diagnosis not present

## 2022-03-30 DIAGNOSIS — H209 Unspecified iridocyclitis: Secondary | ICD-10-CM | POA: Diagnosis not present

## 2022-03-30 DIAGNOSIS — Z85828 Personal history of other malignant neoplasm of skin: Secondary | ICD-10-CM | POA: Diagnosis not present

## 2022-03-30 DIAGNOSIS — M545 Low back pain, unspecified: Secondary | ICD-10-CM | POA: Diagnosis not present

## 2022-03-30 DIAGNOSIS — C4492 Squamous cell carcinoma of skin, unspecified: Secondary | ICD-10-CM | POA: Diagnosis not present

## 2022-03-30 DIAGNOSIS — Z6841 Body Mass Index (BMI) 40.0 and over, adult: Secondary | ICD-10-CM | POA: Diagnosis not present

## 2022-04-01 ENCOUNTER — Ambulatory Visit (HOSPITAL_COMMUNITY)
Admission: RE | Admit: 2022-04-01 | Discharge: 2022-04-01 | Disposition: A | Discharge status: Home / Self Care | Payer: Medicare Other | Source: Ambulatory Visit | Attending: Physician Assistant | Admitting: Physician Assistant

## 2022-04-01 ENCOUNTER — Other Ambulatory Visit (HOSPITAL_COMMUNITY): Payer: Medicare Other

## 2022-04-01 DIAGNOSIS — R195 Other fecal abnormalities: Secondary | ICD-10-CM | POA: Insufficient documentation

## 2022-04-01 DIAGNOSIS — Z8719 Personal history of other diseases of the digestive system: Secondary | ICD-10-CM | POA: Diagnosis not present

## 2022-04-01 DIAGNOSIS — R109 Unspecified abdominal pain: Secondary | ICD-10-CM | POA: Diagnosis not present

## 2022-04-01 DIAGNOSIS — I700 Atherosclerosis of aorta: Secondary | ICD-10-CM | POA: Diagnosis not present

## 2022-04-01 DIAGNOSIS — G8929 Other chronic pain: Secondary | ICD-10-CM | POA: Diagnosis not present

## 2022-04-01 MED ORDER — SODIUM CHLORIDE (PF) 0.9 % IJ SOLN
INTRAMUSCULAR | Status: AC
  Filled 2022-04-01: qty 50

## 2022-04-01 MED ORDER — IOHEXOL 300 MG/ML  SOLN
100.0000 mL | Freq: Once | INTRAMUSCULAR | Status: AC | PRN
  Administered 2022-04-01: 100 mL via INTRAVENOUS

## 2022-04-04 LAB — CALPROTECTIN, FECAL: Calprotectin, Fecal: 161 ug/g — ABNORMAL HIGH (ref 0–120)

## 2022-04-04 LAB — SPECIMEN STATUS REPORT

## 2022-04-05 ENCOUNTER — Ambulatory Visit (INDEPENDENT_AMBULATORY_CARE_PROVIDER_SITE_OTHER): Payer: Medicare Other

## 2022-04-05 DIAGNOSIS — Z5181 Encounter for therapeutic drug level monitoring: Secondary | ICD-10-CM | POA: Diagnosis not present

## 2022-04-05 DIAGNOSIS — I4891 Unspecified atrial fibrillation: Secondary | ICD-10-CM

## 2022-04-05 LAB — POCT INR: INR: 3 (ref 2.0–3.0)

## 2022-04-05 NOTE — Patient Instructions (Signed)
Description   Continue taking 1 tablet daily except 0.5 tablet Tuesdays and Thursdays.  Repeat INR in 4 weeks.  Coumadin Clinic [phone]

## 2022-04-13 DIAGNOSIS — M0589 Other rheumatoid arthritis with rheumatoid factor of multiple sites: Secondary | ICD-10-CM | POA: Diagnosis not present

## 2022-04-13 DIAGNOSIS — M7989 Other specified soft tissue disorders: Secondary | ICD-10-CM | POA: Diagnosis not present

## 2022-04-13 DIAGNOSIS — Z6841 Body Mass Index (BMI) 40.0 and over, adult: Secondary | ICD-10-CM | POA: Diagnosis not present

## 2022-04-13 DIAGNOSIS — K5090 Crohn's disease, unspecified, without complications: Secondary | ICD-10-CM | POA: Diagnosis not present

## 2022-04-13 DIAGNOSIS — C4492 Squamous cell carcinoma of skin, unspecified: Secondary | ICD-10-CM | POA: Diagnosis not present

## 2022-04-13 DIAGNOSIS — H209 Unspecified iridocyclitis: Secondary | ICD-10-CM | POA: Diagnosis not present

## 2022-04-13 DIAGNOSIS — Q600 Renal agenesis, unilateral: Secondary | ICD-10-CM | POA: Diagnosis not present

## 2022-04-13 DIAGNOSIS — Z85828 Personal history of other malignant neoplasm of skin: Secondary | ICD-10-CM | POA: Diagnosis not present

## 2022-05-03 DIAGNOSIS — M0589 Other rheumatoid arthritis with rheumatoid factor of multiple sites: Secondary | ICD-10-CM | POA: Diagnosis not present

## 2022-05-03 DIAGNOSIS — Z79899 Other long term (current) drug therapy: Secondary | ICD-10-CM | POA: Diagnosis not present

## 2022-05-10 ENCOUNTER — Ambulatory Visit (INDEPENDENT_AMBULATORY_CARE_PROVIDER_SITE_OTHER): Payer: Medicare Other

## 2022-05-10 DIAGNOSIS — Z5181 Encounter for therapeutic drug level monitoring: Secondary | ICD-10-CM | POA: Diagnosis not present

## 2022-05-10 DIAGNOSIS — I4891 Unspecified atrial fibrillation: Secondary | ICD-10-CM

## 2022-05-10 LAB — POCT INR: INR: 2.9 (ref 2.0–3.0)

## 2022-05-10 NOTE — Patient Instructions (Signed)
Description   Continue taking 1 tablet daily except 0.5 tablet Tuesdays and Thursdays.  Repeat INR in 5 weeks.  Coumadin Clinic [phone]

## 2022-05-19 ENCOUNTER — Telehealth: Payer: Self-pay | Admitting: Cardiology

## 2022-05-19 ENCOUNTER — Other Ambulatory Visit: Payer: Self-pay | Admitting: Cardiology

## 2022-05-19 NOTE — Telephone Encounter (Signed)
Routed to pharmD to review 

## 2022-05-19 NOTE — Telephone Encounter (Signed)
Spoke to [place] at [employer] and verbalized understanding.

## 2022-05-19 NOTE — Telephone Encounter (Signed)
Pt c/o medication issue:  1. Name of Medication:   dofetilide (TIKOSYN) 125 MCG capsule  2. How are you currently taking this medication (dosage and times per day)?   3. Are you having a reaction (difficulty breathing--STAT)?   4. What is your medication issue?   Caller is concern this medication may cause an interaction with her escitalopram (LEXAPRO) 20 MG tablet prescription.  Please advise.

## 2022-05-19 NOTE — Telephone Encounter (Signed)
Tikosyn and escitalopram can increase QT interval however patient has been on this particular regimen for many years. Has appt with Dr Stanford Breed at the end of August when EKG can be performed

## 2022-05-23 ENCOUNTER — Ambulatory Visit (INDEPENDENT_AMBULATORY_CARE_PROVIDER_SITE_OTHER): Payer: Medicare Other | Admitting: Podiatry

## 2022-05-23 ENCOUNTER — Encounter: Payer: Self-pay | Admitting: Podiatry

## 2022-05-23 DIAGNOSIS — M79674 Pain in right toe(s): Secondary | ICD-10-CM | POA: Diagnosis not present

## 2022-05-23 DIAGNOSIS — B351 Tinea unguium: Secondary | ICD-10-CM

## 2022-05-23 DIAGNOSIS — Z7901 Long term (current) use of anticoagulants: Secondary | ICD-10-CM | POA: Diagnosis not present

## 2022-05-23 DIAGNOSIS — I739 Peripheral vascular disease, unspecified: Secondary | ICD-10-CM

## 2022-05-23 DIAGNOSIS — M79675 Pain in left toe(s): Secondary | ICD-10-CM

## 2022-05-23 NOTE — Progress Notes (Signed)
Subjective: [name] is a 76 y.o. female patient seen today in office with complaint of mildly painful thickened and elongated toenails; unable to trim. Patient reports no changes with medications since last visit and states that she saw the dermatologist the other day as well.    Currently anticoagulated for A. Fib as previously noted.  History of rheumatoid arthritis.  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   Uveitis 08/18/2021   Psoriasis 08/04/2021   Vitamin D deficiency 08/04/2021   Blepharitis 11/17/2019   Hip pain, chronic, left 12/03/2018   Dry eyes 08/05/2018   Onychomycosis 06/03/2018   Renal oncocytoma    Pseudoaneurysm of right femoral artery (HCC)    Paroxysmal atrial fibrillation (HCC)    Pancreatitis    Hypertension    GERD (gastroesophageal reflux disease)    CAD (coronary artery disease)    Arthritis    Anxiety    Rheumatoid arthritis ([place]) 05/29/2017   SCC (squamous cell carcinoma), 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   Dry mouth 08/22/2016   Cerebrovascular disease 07/30/2014   Sinusitis 06/30/2014   Encounter for therapeutic drug monitoring 06/09/2014   Pain in joint of right shoulder 04/21/2014   Other acne 04/21/2014   Intertrigo 11/12/2013   Keloid of skin    Bilateral leg edema 09/12/2012   IBS (irritable bowel syndrome) 08/21/2012   Medicare annual wellness visit, subsequent 08/17/2012   Radicular low back pain 06/28/2012   Basal cell carcinoma 02/07/2012   Maxillary sinus cyst 02/07/2012   Statin intolerance 02/28/2011   Crohn's colitis ([place])    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   CAROTID BRUIT 12/10/2009   Hyperglycemia 07/02/2009   HEADACHE 02/24/2009   ATRIAL FIBRILLATION 01/15/2009   Obesity 01/03/2008   OSA  (obstructive sleep apnea) 01/03/2008   Osteoporosis 11/05/2007   Hyperlipidemia 09/06/2007   DEPRESSION/ANXIETY 09/06/2007   Hearing loss 09/06/2007   Essential hypertension 09/06/2007   MYOCARDIAL INFARCTION, HX OF 09/06/2007   Coronary atherosclerosis 09/06/2007   ALLERGIC RHINITIS 09/06/2007   COLONIC POLYPS, ADENOMATOUS, HX OF 07/04/2007    Current Outpatient Medications on File Prior to Visit  Medication Sig Dispense Refill   amLODipine (NORVASC) 10 MG tablet TAKE 1 TABLET BY MOUTH EVERY DAY 90 tablet 2   dextrose 5 % SOLN 50 mL with methotrexate 25 MG/ML (PF) SOLN 40 mg/m2 Inject 40 mg/m2 into the vein once a week. Every wednesday     dofetilide (TIKOSYN) 125 MCG capsule TAKE 3 CAPSULES BY MOUTH TWICE A DAY 540 capsule 1   doxazosin (CARDURA) 2 MG tablet TAKE 1 TABLET BY MOUTH EVERY DAY 90 tablet 2   escitalopram (LEXAPRO) 20 MG tablet TAKE 1 TABLET BY MOUTH EVERY DAY 90 tablet 2   esomeprazole (NEXIUM) 40 MG capsule Take 1 capsule (40 mg total) by mouth daily. Take in the morning before breakfast. 30 capsule 11   folic acid (FOLVITE) 1 MG tablet Take 2 mg by mouth daily.     InFLIXimab (REMICADE IV) Inject into the vein as directed.     inFLIXimab 10 mg/kg in sodium chloride 0.9 % Inject 10 mg/kg into the vein every 6 (six) weeks.     loperamide (IMODIUM A-D) 2 MG tablet Take 1 mg by mouth as needed. Dihrrhea  metoprolol tartrate (LOPRESSOR) 25 MG tablet TAKE 1 TABLET (25 MG TOTAL) BY MOUTH IN THE MORNING AND AT BEDTIME. 180 tablet 3   Multiple Vitamin (MULTIVITAMIN) capsule Take 1 capsule by mouth daily. 1 daily     nitroGLYCERIN (NITROSTAT) 0.4 MG SL tablet Place 1 tablet (0.4 mg total) under the tongue every 5 (five) minutes as needed for chest pain. Do not exceed 3 tabs in 15 minutes 75 tablet 3   Probiotic Product ([place]) CAPS Take 1 capsule by mouth daily. 1 per day     TUBERCULIN SYR 1CC/27GX1/2" 27G X 1/2" 1 ML MISC      valsartan (DIOVAN) 160 MG tablet  Take 1 tablet (160 mg total) by mouth daily. 90 tablet 3   warfarin (COUMADIN) 2.5 MG tablet TAKE 1/2 TO 1 TABLET DAILY AS DIRECTED BY COUMADIN CLINIC 90 tablet 0   No current facility-administered medications on file prior to visit.    Allergies  Allergen Reactions   Hydralazine Diarrhea and Nausea Only    Anorexia, upset stomach, burning pain in abdomin   Cefdinir     Pt cannot take; may interfere with AFib.   Codeine     Heart beats fast    Fosamax [Alendronate Sodium] Other (See Comments)    GI upset   Guaifenesin Er Nausea And Vomiting and Other (See Comments)    Headaches; can tolerate liquid.   Latex Other (See Comments)    Breathing problems   Mucilgen [Psyllium]    Percocet [Oxycodone-Acetaminophen]     Itching & swelling in jaw    Objective: Physical Exam  General: Well developed, nourished, no acute distress, awake, alert and oriented x 3  Vascular: Dorsalis pedis artery 1/4 bilateral, Posterior tibial artery 0/4 bilateral, skin temperature warm to warm proximal to distal bilateral lower extremities, mild varicosities, pedal hair present bilateral.  Trace edema to ankles pitting in nature nonpainful.  Neurological: Gross sensation present via light touch bilateral.   Dermatological: Skin is warm, dry, and supple bilateral, Nails 1-10 are tender, long, thick, and discolored with mild subungal debris, no webspace macerations present bilateral, no acute signs of infection, no open lesions present bilateral, no callus/corns/hyperkeratotic tissue present bilateral. No signs of infection bilateral.  Musculoskeletal: No symptomatic boney deformities noted bilateral. Muscular strength within normal limits without painon range of motion. No pain with calf compression bilateral.  Assessment and Plan:  Problem List Items Addressed This Visit   None Visit Diagnoses     Pain due to onychomycosis of toenails of both feet    -  Primary   PVD (peripheral vascular disease)  ([place])       Current use of anticoagulant therapy           -Examined patient.  -Discussed treatment options for painful mycotic nails. -Mechanically debrided and reduced all painful mycotic nails with sterile nail nipper and dremel nail file without incident. -Encouraged elevation to assist with edema control like before -Patient to return in 3 months for follow up evaluation or sooner if symptoms worsen.  Lorenda Peck, DPM

## 2022-05-26 ENCOUNTER — Ambulatory Visit: Payer: Medicare Other

## 2022-05-27 ENCOUNTER — Ambulatory Visit (INDEPENDENT_AMBULATORY_CARE_PROVIDER_SITE_OTHER): Payer: Medicare Other

## 2022-05-27 DIAGNOSIS — Z0000 Encounter for general adult medical examination without abnormal findings: Secondary | ICD-10-CM

## 2022-05-27 NOTE — Patient Instructions (Signed)
Ms. Hooser , Thank you for taking time to come for your Medicare Wellness Visit. I appreciate your ongoing commitment to your health goals. Please review the following plan we discussed and let me know if I can assist you in the future.   Screening recommendations/referrals: Colonoscopy: no longer needed Mammogram: 07/26/21 due 07/26/22 Bone Density: 07/26/21 due 07/26/24 Recommended yearly ophthalmology/optometry visit for glaucoma screening and checkup Recommended yearly dental visit for hygiene and checkup  Vaccinations: Influenza vaccine: up to date Pneumococcal vaccine: up to date Tdap vaccine: Due-May obtain vaccine at your local pharmacy.  Shingles vaccine: Due-May obtain vaccine at your local pharmacy.    Covid-19:Due-May obtain vaccine at your local pharmacy.   Advanced directives: no  Conditions/risks identified: see problem list  Next appointment: Follow up in one year for your annual wellness visit    Preventive Care 65 Years and Older, Female Preventive care refers to lifestyle choices and visits with your health care provider that can promote health and wellness. What does preventive care include? A yearly physical exam. This is also called an annual well check. Dental exams once or twice a year. Routine eye exams. Ask your health care provider how often you should have your eyes checked. Personal lifestyle choices, including: Daily care of your teeth and gums. Regular physical activity. Eating a healthy diet. Avoiding tobacco and drug use. Limiting alcohol use. Practicing safe sex. Taking low-dose aspirin every day. Taking vitamin and mineral supplements as recommended by your health care provider. What happens during an annual well check? The services and screenings done by your health care provider during your annual well check will depend on your age, overall health, lifestyle risk factors, and family history of disease. Counseling  Your health care provider  may ask you questions about your: Alcohol use. Tobacco use. Drug use. Emotional well-being. Home and relationship well-being. Sexual activity. Eating habits. History of falls. Memory and ability to understand (cognition). Work and work Statistician. Reproductive health. Screening  You may have the following tests or measurements: Height, weight, and BMI. Blood pressure. Lipid and cholesterol levels. These may be checked every 5 years, or more frequently if you are over 71 years old. Skin check. Lung cancer screening. You may have this screening every year starting at age 41 if you have a 30-pack-year history of smoking and currently smoke or have quit within the past 15 years. Fecal occult blood test (FOBT) of the stool. You may have this test every year starting at age 62. Flexible sigmoidoscopy or colonoscopy. You may have a sigmoidoscopy every 5 years or a colonoscopy every 10 years starting at age 46. Hepatitis C blood test. Hepatitis B blood test. Sexually transmitted disease (STD) testing. Diabetes screening. This is done by checking your blood sugar (glucose) after you have not eaten for a while (fasting). You may have this done every 1-3 years. Bone density scan. This is done to screen for osteoporosis. You may have this done starting at age 23. Mammogram. This may be done every 1-2 years. Talk to your health care provider about how often you should have regular mammograms. Talk with your health care provider about your test results, treatment options, and if necessary, the need for more tests. Vaccines  Your health care provider may recommend certain vaccines, such as: Influenza vaccine. This is recommended every year. Tetanus, diphtheria, and acellular pertussis (Tdap, Td) vaccine. You may need a Td booster every 10 years. Zoster vaccine. You may need this after age 43. Pneumococcal 13-valent  conjugate (PCV13) vaccine. One dose is recommended after age 3. Pneumococcal  polysaccharide (PPSV23) vaccine. One dose is recommended after age 20. Talk to your health care provider about which screenings and vaccines you need and how often you need them. This information is not intended to replace advice given to you by your health care provider. Make sure you discuss any questions you have with your health care provider. Document Released: 11/06/2015 Document Revised: 06/29/2016 Document Reviewed: 08/11/2015 Elsevier Interactive Patient Education  2017 [place] Prevention in the Home Falls can cause injuries. They can happen to people of all ages. There are many things you can do to make your home safe and to help prevent falls. What can I do on the outside of my home? Regularly fix the edges of walkways and driveways and fix any cracks. Remove anything that might make you trip as you walk through a door, such as a raised step or threshold. Trim any bushes or trees on the path to your home. Use bright outdoor lighting. Clear any walking paths of anything that might make someone trip, such as rocks or tools. Regularly check to see if handrails are loose or broken. Make sure that both sides of any steps have handrails. Any raised decks and porches should have guardrails on the edges. Have any leaves, snow, or ice cleared regularly. Use sand or salt on walking paths during winter. Clean up any spills in your garage right away. This includes oil or grease spills. What can I do in the bathroom? Use night lights. Install grab bars by the toilet and in the tub and shower. Do not use towel bars as grab bars. Use non-skid mats or decals in the tub or shower. If you need to sit down in the shower, use a plastic, non-slip stool. Keep the floor dry. Clean up any water that spills on the floor as soon as it happens. Remove soap buildup in the tub or shower regularly. Attach bath mats securely with double-sided non-slip rug tape. Do not have throw rugs and other  things on the floor that can make you trip. What can I do in the bedroom? Use night lights. Make sure that you have a light by your bed that is easy to reach. Do not use any sheets or blankets that are too big for your bed. They should not hang down onto the floor. Have a firm chair that has side arms. You can use this for support while you get dressed. Do not have throw rugs and other things on the floor that can make you trip. What can I do in the kitchen? Clean up any spills right away. Avoid walking on wet floors. Keep items that you use a lot in easy-to-reach places. If you need to reach something above you, use a strong step stool that has a grab bar. Keep electrical cords out of the way. Do not use floor polish or wax that makes floors slippery. If you must use wax, use non-skid floor wax. Do not have throw rugs and other things on the floor that can make you trip. What can I do with my stairs? Do not leave any items on the stairs. Make sure that there are handrails on both sides of the stairs and use them. Fix handrails that are broken or loose. Make sure that handrails are as long as the stairways. Check any carpeting to make sure that it is firmly attached to the stairs. Fix any carpet that  is loose or worn. Avoid having throw rugs at the top or bottom of the stairs. If you do have throw rugs, attach them to the floor with carpet tape. Make sure that you have a light switch at the top of the stairs and the bottom of the stairs. If you do not have them, ask someone to add them for you. What else can I do to help prevent falls? Wear shoes that: Do not have high heels. Have rubber bottoms. Are comfortable and fit you well. Are closed at the toe. Do not wear sandals. If you use a stepladder: Make sure that it is fully opened. Do not climb a closed stepladder. Make sure that both sides of the stepladder are locked into place. Ask someone to hold it for you, if possible. Clearly  mark and make sure that you can see: Any grab bars or handrails. First and last steps. Where the edge of each step is. Use tools that help you move around (mobility aids) if they are needed. These include: Canes. Walkers. Scooters. Crutches. Turn on the lights when you go into a dark area. Replace any light bulbs as soon as they burn out. Set up your furniture so you have a clear path. Avoid moving your furniture around. If any of your floors are uneven, fix them. If there are any pets around you, be aware of where they are. Review your medicines with your doctor. Some medicines can make you feel dizzy. This can increase your chance of falling. Ask your doctor what other things that you can do to help prevent falls. This information is not intended to replace advice given to you by your health care provider. Make sure you discuss any questions you have with your health care provider. Document Released: 08/06/2009 Document Revised: 03/17/2016 Document Reviewed: 11/14/2014 Elsevier Interactive Patient Education  2017 [employer].

## 2022-05-27 NOTE — Progress Notes (Signed)
Subjective:   [name] is a 76 y.o. female who presents for Medicare Annual (Subsequent) preventive examination.  I connected with  [name] on 05/27/22 by a audio enabled telemedicine application and verified that I am speaking with the correct person using two identifiers.  Patient Location: Home  Provider Location: Office/Clinic  I discussed the limitations of evaluation and management by telemedicine. The patient expressed understanding and agreed to proceed.   Review of Systems     Cardiac Risk Factors include: advanced age (>70mn, >>57women);hypertension;dyslipidemia;obesity (BMI >30kg/m2)     Objective:    Today's Vitals   05/27/22 1345  PainSc: 5    There is no height or weight on file to calculate BMI.     05/27/2022    1:44 PM 05/24/2021    1:20 PM 12/06/2019    1:11 PM 12/03/2018    4:11 PM 11/18/2018   11:21 AM 11/30/2017    9:16 AM 12/14/2015   10:27 AM  Advanced Directives  Does Patient Have a Medical Advance Directive? No No No No No No No  Does patient want to make changes to medical advance directive?  No - Patient declined       Would patient like information on creating a medical advance directive? No - Patient declined  No - Patient declined Yes (MAU/Ambulatory/Procedural Areas - Information given) No - Patient declined No - Patient declined No - patient declined information    Current Medications (verified) Outpatient Encounter Medications as of 05/27/2022  Medication Sig   amLODipine (NORVASC) 10 MG tablet TAKE 1 TABLET BY MOUTH EVERY DAY   dextrose 5 % SOLN 50 mL with methotrexate 25 MG/ML (PF) SOLN 40 mg/m2 Inject 40 mg/m2 into the vein once a week. Every wednesday   dofetilide (TIKOSYN) 125 MCG capsule TAKE 3 CAPSULES BY MOUTH TWICE A DAY   doxazosin (CARDURA) 2 MG tablet TAKE 1 TABLET BY MOUTH EVERY DAY   escitalopram (LEXAPRO) 20 MG tablet TAKE 1 TABLET BY MOUTH EVERY DAY   esomeprazole (NEXIUM) 40 MG capsule Take 1 capsule (40 mg  total) by mouth daily. Take in the morning before breakfast.   folic acid (FOLVITE) 1 MG tablet Take 2 mg by mouth daily.   inFLIXimab 10 mg/kg in sodium chloride 0.9 % Inject 10 mg/kg into the vein every 6 (six) weeks.   loperamide (IMODIUM A-D) 2 MG tablet Take 1 mg by mouth as needed. Dihrrhea   metoprolol tartrate (LOPRESSOR) 25 MG tablet TAKE 1 TABLET (25 MG TOTAL) BY MOUTH IN THE MORNING AND AT BEDTIME.   Multiple Vitamin (MULTIVITAMIN) capsule Take 1 capsule by mouth daily. 1 daily   nitroGLYCERIN (NITROSTAT) 0.4 MG SL tablet Place 1 tablet (0.4 mg total) under the tongue every 5 (five) minutes as needed for chest pain. Do not exceed 3 tabs in 15 minutes   Probiotic Product ([place] CAPS Take 1 capsule by mouth daily. 1 per day   TUBERCULIN SYR 1CC/27GX1/2" 27G X 1/2" 1 ML MISC    valsartan (DIOVAN) 160 MG tablet Take 1 tablet (160 mg total) by mouth daily.   warfarin (COUMADIN) 2.5 MG tablet TAKE 1/2 TO 1 TABLET DAILY AS DIRECTED BY COUMADIN CLINIC   [DISCONTINUED] InFLIXimab (REMICADE IV) Inject into the vein as directed.   No facility-administered encounter medications on file as of 05/27/2022.    Allergies (verified) Hydralazine, Cefdinir, Codeine, Fosamax [alendronate sodium], Guaifenesin er, Latex, Mucilgen [psyllium], and Percocet [oxycodone-acetaminophen]   History: Past  Medical History:  Diagnosis Date   Anxiety    Arthritis    Atrial fibrillation ([place]) 2008   CAD (coronary artery disease)    Crohn's colitis ([place])    she reports ulcerative colitis beginning in her 27's, biopsies 2008 suggest Crohn's not UC   Depression    Diverticulosis    GERD (gastroesophageal reflux disease)    Hyperlipidemia    Hypertension    IBS (irritable bowel syndrome)    Keloid of skin    Obesity    OSA (obstructive sleep apnea)    Pancreatitis    Paroxysmal atrial fibrillation (HCC)    Pseudoaneurysm of right femoral artery (HCC)    Renal oncocytoma    s/p right  nephrectomy   Rheumatoid arthritis ([place]) 05/29/2017   UC (ulcerative colitis) ([place])    Past Surgical History:  Procedure Laterality Date   ANGIOPLASTY     BREAST BIOPSY  10/24/1994   benign lesion    Cataract surgery Left 11/13/2014   CHOLECYSTECTOMY  10/24/2006   COLONOSCOPY W/ BIOPSIES     CORONARY ANGIOPLASTY     ESOPHAGOGASTRODUODENOSCOPY     NEPHRECTOMY  10/24/2006   right secondary to oncocytoma    RIGHT FEMORAL  PSEUDOANEURYSM & RIGHT GROIN HEMATOMA EVACUATION  02/22/2007   RIGHT RENAL ARTERY REPAIR  10/24/1974   TUBAL LIGATION     Family History  Problem Relation Age of Onset   Hypertension Mother    Coronary artery disease Mother    Stroke Mother    Clotting disorder Mother    Heart disease Mother    Allergies Mother    Colitis Father    Heart disease Father    Clotting disorder Sister    Stroke Brother    Clotting disorder Brother    Cancer Daughter    Asthma Daughter    Asthma Maternal Uncle    Colon cancer Paternal Aunt    Colon polyps Neg Hx    Kidney disease Neg Hx    Stomach cancer Neg Hx    Esophageal cancer Neg Hx    Social History   Socioeconomic History   Marital status: Married    Spouse name: Not on file   Number of children: 3   Years of education: Not on file   Highest education level: Not on file  Occupational History   Occupation: Retired    Fish farm manager: RETIRED    Comment: retired  Tobacco Use   Smoking status: Never   Smokeless tobacco: Never  Vaping Use   Vaping Use: Never used  Substance and Sexual Activity   Alcohol use: No   Drug use: No   Sexual activity: Never  Other Topics Concern   Not on file  Social History Narrative   Married since 1965 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, 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 - adults, 2 grandchildrenNo pets -Daughter has breast cancer   Social Determinants of Health   Financial Resource Strain: Low Risk  (05/24/2021)   Overall Financial Resource Strain (CARDIA)    Difficulty of  Paying Living Expenses: Not hard at all  Food Insecurity: No [place] (05/24/2021)   Hunger Vital Sign    Worried About Running Out of Food in the Last Year: Never true    [place] in the Last Year: Never true  Transportation Needs: No Transportation Needs (05/24/2021)   PRAPARE - Hydrologist (Medical): No    Lack of Transportation (Non-Medical): No  Physical Activity: Inactive (  05/24/2021)   Exercise Vital Sign    Days of Exercise per Week: 0 days    Minutes of Exercise per Session: 0 min  Stress: No Stress Concern Present (05/24/2021)   [place]    Feeling of Stress : Not at all  Social Connections: Moderately Isolated (05/24/2021)   Social Connection and Isolation Panel [NHANES]    Frequency of Communication with Friends and Family: More than three times a week    Frequency of Social Gatherings with Friends and Family: More than three times a week    Attends Religious Services: Never    Marine scientist or Organizations: No    Attends Music therapist: Never    Marital Status: Married    Tobacco Counseling Counseling given: Not Answered   Clinical Intake:  Pre-visit preparation completed: Yes  Pain : 0-10 Pain Score: 5  Pain Type: Chronic pain Pain Location: Other (Comment) (feet and ankles) Pain Descriptors / Indicators: Aching, Dull, Sore Pain Onset: More than a month ago Pain Frequency: Intermittent     BMI - recorded: 44.92 Nutritional Status: BMI > 30  Obese Nutritional Risks: None Diabetes: No  How often do you need to have someone help you when you read instructions, pamphlets, or other written materials from your doctor or pharmacy?: 1 - Never  Diabetic?no  Interpreter Needed?: No  Information entered by :: Lissa Rowles   Activities of Daily Living    05/27/2022    1:47 PM  In your present state of health, do you have any  difficulty performing the following activities:  Hearing? 1  Vision? 0  Difficulty concentrating or making decisions? 0  Walking or climbing stairs? 1  Dressing or bathing? 0  Doing errands, shopping? 1  Preparing Food and eating ? N  Using the Toilet? N  In the past six months, have you accidently leaked urine? Y  Do you have problems with loss of bowel control? Y  Managing your Medications? N  Managing your Finances? N  Housekeeping or managing your Housekeeping? N    Patient Care Team: [name], MD as PCP - General (Family Medicine) Stanford Breed Denice Bors, MD as PCP - Cardiology (Cardiology) Gatha Mayer, MD as Consulting Physician (Gastroenterology) Rigoberto Noel, MD as Consulting Physician (Pulmonary Disease) Calvert Cantor, MD as Consulting Physician (Ophthalmology) Hennie Duos, MD as Consulting Physician (Rheumatology) [place], Jennefer Bravo, MD as Referring Physician (Dermatology)  Indicate any recent Medical Services you may have received from other than [facility] providers in the past year (date may be approximate).     Assessment:   This is a routine wellness examination for [name].  Hearing/Vision screen No results found.  Dietary issues and exercise activities discussed: Current Exercise Habits: Home exercise routine, Type of exercise: stretching;strength training/weights;walking, Time (Minutes): 15, Frequency (Times/Week): 7, Weekly Exercise (Minutes/Week): 105, Intensity: Mild, Exercise limited by: None identified   Goals Addressed             This Visit's Progress    Increase physical activity   On track    Walking      Patient Stated   On track    Continue to drink plenty of water       Depression Screen    05/27/2022    1:44 PM 05/24/2021    1:25 PM 12/06/2019    1:15 PM 12/03/2018    4:12 PM 11/30/2017    9:18 AM 05/29/2017  10:10 AM 12/14/2015   10:32 AM  PHQ 2/9 Scores  PHQ - 2 Score 0 0 0 1 1 0 0    Fall Risk    05/27/2022    1:44  PM 02/10/2022    1:13 PM 05/24/2021    1:23 PM 12/06/2019    1:14 PM 12/03/2018    4:12 PM  Fall Risk   Falls in the past year? 0 0 0 0 1  Number falls in past yr: 0 0 0 0 0  Injury with Fall? 0 0 0 0 0  Risk for fall due to : No Fall Risks No Fall Risks     Follow up Falls evaluation completed Falls evaluation completed Falls prevention discussed Education provided;Falls prevention discussed     FALL RISK PREVENTION PERTAINING TO THE HOME:  Any stairs in or around the home? Yes  If so, are there any without handrails? No  Home free of loose throw rugs in walkways, pet beds, electrical cords, etc? Yes  Adequate lighting in your home to reduce risk of falls? Yes   ASSISTIVE DEVICES UTILIZED TO PREVENT FALLS:  Life alert? No  Use of a cane, walker or w/c? Yes  Grab bars in the bathroom? Yes  Shower chair or bench in shower? Yes  Elevated toilet seat or a handicapped toilet? Yes   TIMED UP AND GO:  Was the test performed? No .     Cognitive Function:    12/03/2018    4:13 PM 11/30/2017    9:21 AM 12/14/2015   10:51 AM  MMSE - Mini Mental State Exam  Orientation to time '5 5 5  '$ Orientation to Place '5 5 5  '$ Registration '3 3 3  '$ Attention/ Calculation '5 5 5  '$ Recall '3 3 3  '$ Language- name 2 objects '2 2 2  '$ Language- repeat '1 1 1  '$ Language- follow 3 step command '3 3 3  '$ Language- read & follow direction '1 1 1  '$ Write a sentence '1 1 1  '$ Copy design '1 1 1  '$ Total score '30 30 30        '$ 05/27/2022    1:52 PM  6CIT Screen  What Year? 0 points  What month? 0 points  What time? 0 points  Count back from 20 0 points  Months in reverse 0 points  Repeat phrase 0 points  Total Score 0 points    Immunizations Immunization History  Administered Date(s) Administered   Fluad Quad(high Dose 65+) 09/12/2019, 08/03/2021   Influenza Split 07/23/2012   Influenza Whole 09/06/2007, 08/11/2008, 08/24/2009, 08/03/2010   Influenza, High Dose Seasonal PF 08/02/2013, 06/29/2016, 07/06/2017,  07/31/2018   Influenza,inj,Quad PF,6+ Mos 06/27/2014, 06/26/2015   Moderna Sars-Covid-2 Vaccination 12/25/2019, 01/22/2020   Pneumococcal Conjugate-13 08/28/2014   Pneumococcal Polysaccharide-23 09/13/2010   Td 05/12/2009   Zoster, Live 02/11/2008    TDAP status: Due, Education has been provided regarding the importance of this vaccine. Advised may receive this vaccine at local pharmacy or Health Dept. Aware to provide a copy of the vaccination record if obtained from local pharmacy or Health Dept. Verbalized acceptance and understanding.  Flu Vaccine status: Up to date  Pneumococcal vaccine status: Up to date  Covid-19 vaccine status: Declined, Education has been provided regarding the importance of this vaccine but patient still declined. Advised may receive this vaccine at local pharmacy or Health Dept.or vaccine clinic. Aware to provide a copy of the vaccination record if obtained from local pharmacy or Health Dept. Verbalized acceptance  and understanding.  Qualifies for Shingles Vaccine? Yes   Zostavax completed No   Shingrix Completed?: No.    Education has been provided regarding the importance of this vaccine. Patient has been advised to call insurance company to determine out of pocket expense if they have not yet received this vaccine. Advised may also receive vaccine at local pharmacy or Health Dept. Verbalized acceptance and understanding.  Screening Tests Health Maintenance  Topic Date Due   Zoster Vaccines- Shingrix (1 of 2) Never done   COLONOSCOPY (Pts 45-70yr Insurance coverage will need to be confirmed)  09/10/2014   TETANUS/TDAP  05/13/2019   COVID-19 Vaccine (3 - Moderna risk series) 02/19/2020   INFLUENZA VACCINE  05/24/2022   FOOT EXAM  08/02/2022 (Originally 08/22/1956)   Pneumonia Vaccine 76 Years old  Completed   DEXA SCAN  Completed   Hepatitis C Screening  Completed   HPV VACCINES  Aged Out   HEMOGLOBIN A1C  Discontinued   OPHTHALMOLOGY EXAM   Discontinued    Health Maintenance  Health Maintenance Due  Topic Date Due   Zoster Vaccines- Shingrix (1 of 2) Never done   COLONOSCOPY (Pts 45-471yrInsurance coverage will need to be confirmed)  09/10/2014   TETANUS/TDAP  05/13/2019   COVID-19 Vaccine (3 - Moderna risk series) 02/19/2020   INFLUENZA VACCINE  05/24/2022    Colorectal cancer screening: No longer required.   Mammogram status: Completed 07/26/21. Repeat every year  Bone Density status: Completed 07/26/21. Results reflect: Bone density results: OSTEOPOROSIS. Repeat every 2 years.  Lung Cancer Screening: (Low Dose CT Chest recommended if Age 77-80ears, 30 pack-year currently smoking OR have quit w/in 15years.) does not qualify.   Lung Cancer Screening Referral: n/a  Additional Screening:  Hepatitis C Screening: does qualify; Completed 06/17/14  Vision Screening: Recommended annual ophthalmology exams for early detection of glaucoma and other disorders of the eye. Is the patient up to date with their annual eye exam?  Yes  Who is the provider or what is the name of the office in which the patient attends annual eye exams? DiBing Plumef pt is not established with a provider, would they like to be referred to a provider to establish care? No .   Dental Screening: Recommended annual dental exams for proper oral hygiene  Community Resource Referral / Chronic Care Management: CRR required this visit?  No   CCM required this visit?  No      Plan:     I have personally reviewed and noted the following in the patient's chart:   Medical and social history Use of alcohol, tobacco or illicit drugs  Current medications and supplements including opioid prescriptions.  Functional ability and status Nutritional status Physical activity Advanced directives List of other physicians Hospitalizations, surgeries, and ER visits in previous 12 months Vitals Screenings to include cognitive, depression, and falls Referrals  and appointments  In addition, I have reviewed and discussed with patient certain preventive protocols, quality metrics, and best practice recommendations. A written personalized care plan for preventive services as well as general preventive health recommendations were provided to patient.   Due to this being a telephonic visit, the after visit summary with patients personalized plan was offered to patient via mail or my-chart. Patient would like to access on my-chart.  ShDuard Bradyhism, CMMarshalltown 05/27/2022   Nurse Notes: none

## 2022-06-03 ENCOUNTER — Other Ambulatory Visit: Payer: Self-pay | Admitting: Cardiology

## 2022-06-08 NOTE — Progress Notes (Signed)
HPI: FU coronary artery disease and atrial fibrillation. Last cardiac catheterization on Mar 06, 2007 showed an ejection fraction of 55%. There is nonobstructive plaque in the LAD, and circumflex and the right coronary artery was normal. 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 Also note the patient is extremely sensitive to Coumadin. MRA of the abdomen in September of 2012 showed no left renal artery stenosis and previous right nephrectomy. 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 Event monitor January 2018 showed sinus with PACs, PVCs and EAT. Carotid dopplers 11/21 showed 1-39 bilateral stenosis. Echo 11/21 showed normal LV function, grade 2 DD, moderate pulmonary hypertension, moderate LAE, mild RAE. Since I last saw her, she has mild dyspnea on exertion but no orthopnea, PND, increased pedal edema, exertional chest pain or syncope.  Current Outpatient Medications  Medication Sig Dispense Refill   amLODipine (NORVASC) 10 MG tablet TAKE 1 TABLET BY MOUTH EVERY DAY 90 tablet 2   dextrose 5 % SOLN 50 mL with methotrexate 25 MG/ML (PF) SOLN 40 mg/m2 Inject 40 mg/m2 into the vein once a week. Every wednesday     dofetilide (TIKOSYN) 125 MCG capsule TAKE 3 CAPSULES BY MOUTH TWICE A DAY 540 capsule 1   doxazosin (CARDURA) 2 MG tablet TAKE 1 TABLET BY MOUTH EVERY DAY 90 tablet 2   escitalopram (LEXAPRO) 20 MG tablet TAKE 1 TABLET BY MOUTH EVERY DAY 90 tablet 2   esomeprazole (NEXIUM) 40 MG capsule Take 1 capsule (40 mg total) by mouth daily. Take in the morning before breakfast. 30 capsule 11   folic acid (FOLVITE) 1 MG tablet Take 2 mg by mouth daily.     inFLIXimab 10 mg/kg in sodium chloride 0.9 % Inject 10 mg/kg into the vein every 6 (six) weeks.     loperamide (IMODIUM A-D) 2 MG tablet Take 1 mg by mouth as needed. Dihrrhea     metoprolol tartrate (LOPRESSOR) 25 MG tablet  TAKE 1 TABLET (25 MG TOTAL) BY MOUTH IN THE MORNING AND AT BEDTIME. 180 tablet 3   Multiple Vitamin (MULTIVITAMIN) capsule Take 1 capsule by mouth daily. 1 daily     nitroGLYCERIN (NITROSTAT) 0.4 MG SL tablet Place 1 tablet (0.4 mg total) under the tongue every 5 (five) minutes as needed for chest pain. Do not exceed 3 tabs in 15 minutes 75 tablet 3   Probiotic Product ([place]) CAPS Take 1 capsule by mouth daily. 1 per day     TUBERCULIN SYR 1CC/27GX1/2" 27G X 1/2" 1 ML MISC      valsartan (DIOVAN) 160 MG tablet Take 1 tablet (160 mg total) by mouth daily. 90 tablet 3   warfarin (COUMADIN) 2.5 MG tablet TAKE 1/2 TO 1 TABLET DAILY AS DIRECTED BY COUMADIN CLINIC 90 tablet 0   No current facility-administered medications for this visit.     Past Medical History:  Diagnosis Date   Anxiety    Arthritis    Atrial fibrillation ([place]) 2008   CAD (coronary artery disease)    Crohn's colitis ([place])    she reports ulcerative colitis beginning in her 53's, biopsies 2008 suggest Crohn's not UC   Depression    Diverticulosis    GERD (gastroesophageal reflux disease)    Hyperlipidemia    Hypertension    IBS (irritable bowel syndrome)    Keloid of skin    Obesity  OSA (obstructive sleep apnea)    Pancreatitis    Paroxysmal atrial fibrillation (HCC)    Pseudoaneurysm of right femoral artery (HCC)    Renal oncocytoma    s/p right nephrectomy   Rheumatoid arthritis ([place]) 05/29/2017   UC (ulcerative colitis) ([place])     Past Surgical History:  Procedure Laterality Date   ANGIOPLASTY     BREAST BIOPSY  10/24/1994   benign lesion    Cataract surgery Left 11/13/2014   CHOLECYSTECTOMY  10/24/2006   COLONOSCOPY W/ BIOPSIES     CORONARY ANGIOPLASTY     ESOPHAGOGASTRODUODENOSCOPY     NEPHRECTOMY  10/24/2006   right secondary to Fernando [place]  02/22/2007   RIGHT RENAL ARTERY REPAIR  10/24/1974   TUBAL LIGATION       Social History   Socioeconomic History   Marital status: Married    Spouse name: Not on file   Number of children: 3   Years of education: Not on file   Highest education level: Not on file  Occupational History   Occupation: Retired    Fish farm manager: RETIRED    Comment: retired  Tobacco Use   Smoking status: Never   Smokeless tobacco: Never  Vaping Use   Vaping Use: Never used  Substance and Sexual Activity   Alcohol use: No   Drug use: No   Sexual activity: Never  Other Topics Concern   Not on file  Social History Narrative   Married since 1965 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, 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 - adults, 2 grandchildrenNo pets -Daughter has breast cancer   Social Determinants of Health   Financial Resource Strain: Low Risk  (05/24/2021)   Overall Financial Resource Strain (CARDIA)    Difficulty of Paying Living Expenses: Not hard at all  Food Insecurity: No Food Insecurity (05/24/2021)   Hunger Vital Sign    Worried About Running Out of Food in the Last Year: Never true    [place] in the Last Year: Never true  Transportation Needs: No Transportation Needs (05/24/2021)   PRAPARE - Hydrologist (Medical): No    Lack of Transportation (Non-Medical): No  Physical Activity: Inactive (05/24/2021)   Exercise Vital Sign    Days of Exercise per Week: 0 days    Minutes of Exercise per Session: 0 min  Stress: No Stress Concern Present (05/24/2021)   [place]    Feeling of Stress : Not at all  Social Connections: Moderately Isolated (05/24/2021)   Social Connection and Isolation Panel [NHANES]    Frequency of Communication with Friends and Family: More than three times a week    Frequency of Social Gatherings with Friends and Family: More than three times a week    Attends Religious Services: Never    Marine scientist or Organizations: No    Attends Theatre manager Meetings: Never    Marital Status: Married  Human resources officer Violence: Not At Risk (05/24/2021)   Humiliation, Afraid, Rape, and Kick questionnaire    Fear of Current or Ex-Partner: No    Emotionally Abused: No    Physically Abused: No    Sexually Abused: No    Family History  Problem Relation Age of Onset   Hypertension Mother    Coronary artery disease Mother    Stroke Mother    Clotting disorder Mother  Heart disease Mother    Allergies Mother    Colitis Father    Heart disease Father    Clotting disorder Sister    Stroke Brother    Clotting disorder Brother    Cancer Daughter    Asthma Daughter    Asthma Maternal Uncle    Colon cancer Paternal Aunt    Colon polyps Neg Hx    Kidney disease Neg Hx    Stomach cancer Neg Hx    Esophageal cancer Neg Hx     ROS: no fevers or chills, productive cough, hemoptysis, dysphasia, odynophagia, melena, hematochezia, dysuria, hematuria, rash, seizure activity, orthopnea, PND, pedal edema, claudication. Remaining systems are negative.  Physical Exam: Well-developed well-nourished in no acute distress.  Skin is warm and dry.  HEENT is normal.  Neck is supple.  Chest is clear to auscultation with normal expansion.  Cardiovascular exam is regular rate and rhythm.  Abdominal exam nontender or distended. No masses palpated. Extremities show no edema. neuro grossly intact  ECG-sinus bradycardia with PACs.  Personally reviewed  A/P  1 paroxysmal atrial fibrillation-patient is in sinus rhythm today.  We will continue Tikosyn  Check potassium, renal function and magnesium.  She is now agreeable to [place].  She did not tolerate Xarelto in the past.  I will discontinue Coumadin.  In 3 days we will begin apixaban 5 mg twice daily.  2 hypertension-blood pressure controlled.  Continue present medications.  3 coronary artery disease-patient not on aspirin given need for anticoagulation.  She is intolerant to statins.  4  hyperlipidemia-intolerant to statins and previously declined other lipid-lowering medications.  5 carotid artery disease-mild on previous Dopplers.  Kirk Ruths, MD

## 2022-06-14 DIAGNOSIS — M0589 Other rheumatoid arthritis with rheumatoid factor of multiple sites: Secondary | ICD-10-CM | POA: Diagnosis not present

## 2022-06-21 ENCOUNTER — Ambulatory Visit: Payer: Medicare Other | Attending: Cardiology

## 2022-06-21 DIAGNOSIS — I2510 Atherosclerotic heart disease of native coronary artery without angina pectoris: Secondary | ICD-10-CM | POA: Insufficient documentation

## 2022-06-21 DIAGNOSIS — Z5181 Encounter for therapeutic drug level monitoring: Secondary | ICD-10-CM | POA: Diagnosis not present

## 2022-06-21 DIAGNOSIS — I480 Paroxysmal atrial fibrillation: Secondary | ICD-10-CM | POA: Insufficient documentation

## 2022-06-21 DIAGNOSIS — I10 Essential (primary) hypertension: Secondary | ICD-10-CM | POA: Insufficient documentation

## 2022-06-21 DIAGNOSIS — I4891 Unspecified atrial fibrillation: Secondary | ICD-10-CM | POA: Insufficient documentation

## 2022-06-21 DIAGNOSIS — E7800 Pure hypercholesterolemia, unspecified: Secondary | ICD-10-CM | POA: Insufficient documentation

## 2022-06-21 LAB — POCT INR: INR: 4 — AB (ref 2.0–3.0)

## 2022-06-21 NOTE — Patient Instructions (Signed)
Description   Hold today's dose and eat greens and then continue taking 1 tablet daily except 0.5 tablet Tuesdays and Thursdays.  Repeat INR in 2 weeks.  [facility] [phone]

## 2022-06-22 ENCOUNTER — Ambulatory Visit (INDEPENDENT_AMBULATORY_CARE_PROVIDER_SITE_OTHER): Payer: Medicare Other | Admitting: Cardiology

## 2022-06-22 ENCOUNTER — Encounter: Payer: Self-pay | Admitting: Cardiology

## 2022-06-22 VITALS — BP 126/62 | HR 51 | Ht 62.5 in | Wt 251.0 lb

## 2022-06-22 DIAGNOSIS — E7800 Pure hypercholesterolemia, unspecified: Secondary | ICD-10-CM | POA: Diagnosis not present

## 2022-06-22 DIAGNOSIS — I480 Paroxysmal atrial fibrillation: Secondary | ICD-10-CM

## 2022-06-22 DIAGNOSIS — I2510 Atherosclerotic heart disease of native coronary artery without angina pectoris: Secondary | ICD-10-CM | POA: Diagnosis not present

## 2022-06-22 DIAGNOSIS — I10 Essential (primary) hypertension: Secondary | ICD-10-CM | POA: Diagnosis not present

## 2022-06-22 DIAGNOSIS — I4891 Unspecified atrial fibrillation: Secondary | ICD-10-CM | POA: Diagnosis not present

## 2022-06-22 DIAGNOSIS — Z5181 Encounter for therapeutic drug level monitoring: Secondary | ICD-10-CM | POA: Diagnosis not present

## 2022-06-22 MED ORDER — APIXABAN 5 MG PO TABS: 5.0000 mg | ORAL_TABLET | Freq: Two times a day (BID) | ORAL | 6 refills | Status: DC

## 2022-06-22 NOTE — Patient Instructions (Signed)
Medication Instructions:   STOP WARFARIN  AFTER 3 DAYS START ELIQUIS 5 MG ONE TABLET TWICE DAILY  *If you need a refill on your cardiac medications before your next appointment, please call your pharmacy*   Follow-Up: At [facility], you and your health needs are our priority.  As part of our continuing mission to provide you with exceptional heart care, we have created designated Provider Care Teams.  These Care Teams include your primary Cardiologist (physician) and Advanced Practice Providers (APPs -  Physician Assistants and Nurse Practitioners) who all work together to provide you with the care you need, when you need it.  We recommend signing up for the patient portal called "MyChart".  Sign up information is provided on this After Visit Summary.  MyChart is used to connect with patients for Virtual Visits (Telemedicine).  Patients are able to view lab/test results, encounter notes, upcoming appointments, etc.  Non-urgent messages can be sent to your provider as well.   To learn more about what you can do with MyChart, go to [email or web address].    Your next appointment:   6 month(s)  The format for your next appointment:   In Person  Provider:   Kirk Ruths, MD

## 2022-06-23 LAB — BASIC METABOLIC PANEL
BUN/Creatinine Ratio: 16 (ref 12–28)
BUN: 18 mg/dL (ref 8–27)
CO2: 22 mmol/L (ref 20–29)
Calcium: 9.2 mg/dL (ref 8.7–10.3)
Chloride: 104 mmol/L (ref 96–106)
Creatinine, Ser: 1.11 mg/dL — ABNORMAL HIGH (ref 0.57–1.00)
Glucose: 119 mg/dL — ABNORMAL HIGH (ref 70–99)
Potassium: 4.2 mmol/L (ref 3.5–5.2)
Sodium: 140 mmol/L (ref 134–144)
eGFR: 52 mL/min/{1.73_m2} — ABNORMAL LOW (ref >59)

## 2022-06-23 LAB — MAGNESIUM: Magnesium: 2.2 mg/dL (ref 1.6–2.3)

## 2022-06-28 ENCOUNTER — Encounter: Payer: Self-pay | Admitting: Cardiology

## 2022-06-28 NOTE — Telephone Encounter (Signed)
Routed for review.

## 2022-07-01 ENCOUNTER — Encounter: Payer: Self-pay | Admitting: Family Medicine

## 2022-07-06 ENCOUNTER — Ambulatory Visit: Payer: Medicare Other | Admitting: Podiatry

## 2022-07-06 ENCOUNTER — Ambulatory Visit (HOSPITAL_COMMUNITY)
Admission: RE | Admit: 2022-07-06 | Discharge: 2022-07-06 | Disposition: A | Discharge status: Home / Self Care | Payer: Medicare Other | Source: Ambulatory Visit | Attending: Nurse Practitioner | Admitting: Nurse Practitioner

## 2022-07-06 VITALS — BP 170/79 | HR 61 | Ht 62.5 in | Wt 247.0 lb

## 2022-07-06 DIAGNOSIS — I480 Paroxysmal atrial fibrillation: Secondary | ICD-10-CM | POA: Diagnosis not present

## 2022-07-06 DIAGNOSIS — D6869 Other thrombophilia: Secondary | ICD-10-CM

## 2022-07-06 MED ORDER — ESCITALOPRAM OXALATE 20 MG PO TABS: ORAL_TABLET | ORAL | Status: DC

## 2022-07-07 NOTE — Progress Notes (Signed)
Electrophysiology TeleHealth Note    Date:  07/06/22   ID:  [name], DOB Mar 15, 1946, MRN [id number]  Location: home  Provider location: [place], [place] [address] Evaluation Performed : F/u  PCP:  [name] Lukes, MD  Primary Cardiologist:  Dr. Stanford [name] Primary Electrophysiologist: none   CC: I just have a few questions    History of Present Illness: [name] is a 76 y.o. female who presents via video conferencing for a telehealth visit today. Pt is on tikosyn which is suppressing her afib nicely. She has no complaints of afib. We discussed appropriateness of anticoagulation. She is currently not having any issues. We did discuss watchman which she may consider in the future. She is compliant with tikosyn and eliquis. She recently saw Dr. Stanford [name] with stable qt and labs.    Today, she denies symptoms of palpitations, chest pain, shortness of breath, orthopnea, PND, lower extremity edema, claudication, dizziness, presyncope, syncope, bleeding, or neurologic sequela. The patient is tolerating medications without difficulties and is otherwise without complaint today.    Atrial Fibrillation Risk Factors:   she does not have a history of rheumatic fever. she does not have a history of alcohol use. The patient does not have a history of early familial atrial fibrillation or other arrhythmias.  she has a BMI of Body mass index is 44.46 kg/m.Marland Kitchen Filed Weights   07/06/22 1402  Weight: 112 kg    Past Medical History:  Diagnosis Date   Anxiety    Arthritis    Atrial fibrillation ([place]) 2008   CAD (coronary artery disease)    Crohn's colitis ([place])    she reports ulcerative colitis beginning in her 26's, biopsies 2008 suggest Crohn's not UC   Depression    Diverticulosis    GERD (gastroesophageal reflux disease)    Hyperlipidemia    Hypertension    IBS (irritable bowel syndrome)    Keloid of skin    Obesity    OSA (obstructive sleep apnea)     Pancreatitis    Paroxysmal atrial fibrillation (H[facility])    Pseudoaneurysm of right femoral artery (H[facility])    Renal oncocytoma    s/p right nephrectomy   Rheumatoid arthritis ([place]) 05/29/2017   UC (ulcerative colitis) ([place])    Past Surgical History:  Procedure Laterality Date   ANGIOPLASTY     BREAST BIOPSY  10/24/1994   benign lesion    Cataract surgery Left 11/13/2014   CHOLECYSTECTOMY  10/24/2006   COLONOSCOPY W/ BIOPSIES     CORONARY ANGIOPLASTY     ESOPHAGOGASTRODUODENOSCOPY     NEPHRECTOMY  10/24/2006   right secondary to oncocytoma    RIGHT FEMORAL  PSEUDOANEURYSM & RIGHT GROIN HEMATOMA EVACUATION  02/22/2007   RIGHT RENAL ARTERY REPAIR  10/24/1974   TUBAL LIGATION       Current Outpatient Medications  Medication Sig Dispense Refill   amLODipine (NORVASC) 10 MG tablet TAKE 1 TABLET BY MOUTH EVERY DAY 90 tablet 2   apixaban (ELIQUIS) 5 MG TABS tablet Take 1 tablet (5 mg total) by mouth 2 (two) times daily. 60 tablet 6   dextrose 5 % SOLN 50 mL with methotrexate 25 MG/ML (PF) SOLN 40 mg/m2 Inject 40 mg/m2 into the vein once a week. Every wednesday     dofetilide (TIKOSYN) 125 MCG capsule TAKE 3 CAPSULES BY MOUTH TWICE A DAY 540 capsule 1   doxazosin (CARDURA) 2 MG tablet TAKE 1 TABLET BY MOUTH EVERY DAY 90  tablet 2   esomeprazole (NEXIUM) 40 MG capsule Take 1 capsule (40 mg total) by mouth daily. Take in the morning before breakfast. 30 capsule 11   folic acid (FOLVITE) 1 MG tablet Take 2 mg by mouth daily.     inFLIXimab 10 mg/kg in sodium chloride 0.9 % Inject 10 mg/kg into the vein every 6 (six) weeks.     loperamide (IMODIUM A-D) 2 MG tablet Take 1 mg by mouth as needed. Dihrrhea     metoprolol tartrate (LOPRESSOR) 25 MG tablet TAKE 1 TABLET (25 MG TOTAL) BY MOUTH IN THE MORNING AND AT BEDTIME. 180 tablet 3   Multiple Vitamin (MULTIVITAMIN) capsule Take 1 capsule by mouth daily. 1 daily     nitroGLYCERIN (NITROSTAT) 0.4 MG SL tablet Place 1 tablet (0.4 mg total) under the  tongue every 5 (five) minutes as needed for chest pain. Do not exceed 3 tabs in 15 minutes 75 tablet 3   Probiotic Product ([place]) CAPS Take 1 capsule by mouth daily. 1 per day     TUBERCULIN SYR 1CC/27GX1/2" 27G X 1/2" 1 ML MISC      valsartan (DIOVAN) 160 MG tablet Take 1 tablet (160 mg total) by mouth daily. 90 tablet 3   escitalopram (LEXAPRO) 20 MG tablet TAKE 1/2 TABLET BY MOUTH EVERY DAY     No current facility-administered medications for this encounter.    Allergies:   Hydralazine, Cefdinir, Codeine, Fosamax [alendronate sodium], Guaifenesin er, Latex, Mucilgen [psyllium], and Percocet [oxycodone-acetaminophen]   Social History:  The patient  reports that she has never smoked. She has never used smokeless tobacco. She reports that she does not drink alcohol and does not use drugs.   Family History:  The patient's  family history includes Allergies in her mother; Asthma in her daughter and maternal uncle; Cancer in her daughter; Clotting disorder in her brother, mother, and sister; Colitis in her father; Colon cancer in her paternal aunt; Coronary artery disease in her mother; Heart disease in her father and mother; Hypertension in her mother; Stroke in her brother and mother.    ROS:  Please see the history of present illness.   All other systems are personally reviewed and negative.   Exam: Well appearing, alert and conversant, regular work of breathing,  good skin color  Recent Labs: 02/10/2022: ALT 16; TSH 3.20 03/17/2022: Hemoglobin 12.9; Platelets 205.0 06/22/2022: BUN 18; Creatinine, Ser 1.11; Magnesium 2.2; Potassium 4.2; Sodium 140  personally reviewed      ASSESSMENT AND PLAN:  1.  Atrial fibrillation Currently quiet on tikosyn, continue 125 mcg bid  Continue metoprolol tartrate 25 mg bid   2. CHA2DS2VASc  score of 8 Continue eliquis 5 mg bid  Watchman procedure discussed today but is not interested in pursing right now   F/u with Dr. Stanford [name] in  March 2024 per recall for tikosyn surveillance and every 6 months Afib clinic as needed   [name] Afib [facility] [address] [place], [place] [address] [phone]      Today, I have spent 10 minutes with the patient with telehealth technology discussing afib .    Bethani, Brugger NP 07/07/2022 8:31 AM  Afib [facility] [place], [place] [address] [phone]   I hereby voluntarily request, consent and authorize the [facility] and its employed or contracted physicians, physician assistants, nurse practitioners or other licensed health care professionals (the Practitioner), to provide me with  telemedicine health care services (the "Services") as deemed necessary by the treating Practitioner. I acknowledge and consent to receive the Services by the Practitioner via telemedicine. I understand that the telemedicine visit will involve communicating with the Practitioner through live audiovisual communication technology and the disclosure of certain medical information by electronic transmission. I acknowledge that I have been given the opportunity to request an in-person assessment or other available alternative prior to the telemedicine visit and am voluntarily participating in the telemedicine visit.   I understand that I have the right to withhold or withdraw my consent to the use of telemedicine in the course of my care at any time, without affecting my right to future care or treatment, and that the Practitioner or I may terminate the telemedicine visit at any time. I understand that I have the right to inspect all information obtained and/or recorded in the course of the telemedicine visit and may receive copies of available information for a reasonable fee.  I understand that some of the potential risks of receiving the Services via telemedicine include:   Delay or interruption in medical  evaluation due to technological equipment failure or disruption;  Information transmitted may not be sufficient (e.g. poor resolution of images) to allow for appropriate medical decision making by the Practitioner; and/or  In rare instances, security protocols could fail, causing a breach of personal health information.   Furthermore, I acknowledge that it is my responsibility to provide information about my medical history, conditions and care that is complete and accurate to the best of my ability. I acknowledge that Practitioner's advice, recommendations, and/or decision may be based on factors not within their control, such as incomplete or inaccurate data provided by me or distortions of diagnostic images or specimens that may result from electronic transmissions. I understand that the practice of medicine is not an exact science and that Practitioner makes no warranties or guarantees regarding treatment outcomes. I acknowledge that I will receive a copy of this consent concurrently upon execution via email to the email address I last provided but may also request a printed copy by calling the office of the [facility].  I understand that my insurance will be billed for this visit.   I have read or had this consent read to me.  I understand the contents of this consent, which adequately explains the benefits and risks of the Services being provided via telemedicine.  I have been provided ample opportunity to ask questions regarding this consent and the Services and have had my questions answered to my satisfaction.  I give my informed consent for the services to be provided through the use of telemedicine in my medical care  By participating in this telemedicine visit I agree to the above.

## 2022-07-11 ENCOUNTER — Telehealth: Payer: Self-pay

## 2022-07-11 NOTE — Telephone Encounter (Signed)
Pt switched to Eliquis. Will discontinue anticoagulation episode for Coumadin Clinic.

## 2022-07-18 ENCOUNTER — Other Ambulatory Visit: Payer: Self-pay | Admitting: Family Medicine

## 2022-07-18 MED ORDER — BUPROPION HCL ER (XL) 150 MG PO TB24: ORAL_TABLET | ORAL | 2 refills | Status: DC

## 2022-07-20 ENCOUNTER — Ambulatory Visit: Payer: Medicare Other | Admitting: Podiatry

## 2022-07-27 DIAGNOSIS — Q600 Renal agenesis, unilateral: Secondary | ICD-10-CM | POA: Diagnosis not present

## 2022-07-27 DIAGNOSIS — C4492 Squamous cell carcinoma of skin, unspecified: Secondary | ICD-10-CM | POA: Diagnosis not present

## 2022-07-27 DIAGNOSIS — K5090 Crohn's disease, unspecified, without complications: Secondary | ICD-10-CM | POA: Diagnosis not present

## 2022-07-27 DIAGNOSIS — H209 Unspecified iridocyclitis: Secondary | ICD-10-CM | POA: Diagnosis not present

## 2022-07-27 DIAGNOSIS — Z85828 Personal history of other malignant neoplasm of skin: Secondary | ICD-10-CM | POA: Diagnosis not present

## 2022-07-27 DIAGNOSIS — Z6841 Body Mass Index (BMI) 40.0 and over, adult: Secondary | ICD-10-CM | POA: Diagnosis not present

## 2022-07-27 DIAGNOSIS — M0589 Other rheumatoid arthritis with rheumatoid factor of multiple sites: Secondary | ICD-10-CM | POA: Diagnosis not present

## 2022-07-27 DIAGNOSIS — M7989 Other specified soft tissue disorders: Secondary | ICD-10-CM | POA: Diagnosis not present

## 2022-07-28 ENCOUNTER — Encounter: Payer: Self-pay | Admitting: Family Medicine

## 2022-07-28 DIAGNOSIS — L989 Disorder of the skin and subcutaneous tissue, unspecified: Secondary | ICD-10-CM

## 2022-08-01 DIAGNOSIS — Z79899 Other long term (current) drug therapy: Secondary | ICD-10-CM | POA: Diagnosis not present

## 2022-08-01 DIAGNOSIS — M0589 Other rheumatoid arthritis with rheumatoid factor of multiple sites: Secondary | ICD-10-CM | POA: Diagnosis not present

## 2022-08-01 DIAGNOSIS — Z111 Encounter for screening for respiratory tuberculosis: Secondary | ICD-10-CM | POA: Diagnosis not present

## 2022-08-01 DIAGNOSIS — R5383 Other fatigue: Secondary | ICD-10-CM | POA: Diagnosis not present

## 2022-08-02 ENCOUNTER — Encounter: Payer: Self-pay | Admitting: Family Medicine

## 2022-08-02 ENCOUNTER — Ambulatory Visit (INDEPENDENT_AMBULATORY_CARE_PROVIDER_SITE_OTHER): Payer: Medicare Other | Admitting: Podiatry

## 2022-08-02 DIAGNOSIS — M79674 Pain in right toe(s): Secondary | ICD-10-CM | POA: Diagnosis not present

## 2022-08-02 DIAGNOSIS — B351 Tinea unguium: Secondary | ICD-10-CM | POA: Diagnosis not present

## 2022-08-02 DIAGNOSIS — M79675 Pain in left toe(s): Secondary | ICD-10-CM | POA: Diagnosis not present

## 2022-08-02 DIAGNOSIS — Z7901 Long term (current) use of anticoagulants: Secondary | ICD-10-CM

## 2022-08-02 DIAGNOSIS — I739 Peripheral vascular disease, unspecified: Secondary | ICD-10-CM

## 2022-08-02 NOTE — Progress Notes (Signed)
  Subjective:  Patient ID: [name], female    DOB: [birth year],  MRN: [id number]  Chief Complaint  Patient presents with   Nail Problem    Room 1  Nail trim     76 y.o. female presents with the above complaint. History confirmed with patient. Patient presenting with pain related to dystrophic thickened elongated nails. Patient is unable to trim own nails related to nail dystrophy and/or mobility issues. Patient does not have a history of T2DM but does have history PVD and has a clotting disorder.   Objective:  Physical Exam: warm, good capillary refill, DP and PT pulses 2/4 bilateral nail exam onychomycosis of the toenails, onycholysis, and dystrophic nails DP pulses palpable, PT pulses palpable, and protective sensation intact Left Foot:  Pain with palpation of nails due to elongation and dystrophic growth.  Right Foot: Pain with palpation of nails due to elongation and dystrophic growth.   Assessment:   1. Pain due to onychomycosis of toenails of both feet   2. PVD (peripheral vascular disease) ([place])   3. Current use of anticoagulant therapy      Plan:  Patient was evaluated and treated and all questions answered.  #Onychomycosis with pain  -Nails palliatively debrided as below. -Educated on self-care  Procedure: Nail Debridement Rationale: Pain Type of Debridement: manual, sharp debridement. Instrumentation: Nail nipper, rotary burr. Number of Nails: 10  Return in about 3 months (around 11/02/2022) for RFC.         Everitt Amber, DPM [facility]

## 2022-08-08 ENCOUNTER — Other Ambulatory Visit (HOSPITAL_BASED_OUTPATIENT_CLINIC_OR_DEPARTMENT_OTHER): Payer: Self-pay | Admitting: Family Medicine

## 2022-08-08 DIAGNOSIS — Z1231 Encounter for screening mammogram for malignant neoplasm of breast: Secondary | ICD-10-CM

## 2022-08-11 ENCOUNTER — Other Ambulatory Visit: Payer: Self-pay | Admitting: Family Medicine

## 2022-08-14 NOTE — Assessment & Plan Note (Signed)
Well controlled, no changes to meds. Encouraged heart healthy diet such as the DASH diet and exercise as tolerated.  °

## 2022-08-14 NOTE — Assessment & Plan Note (Signed)
Rate controlled, tolerating meds 

## 2022-08-14 NOTE — Assessment & Plan Note (Signed)
Encourage heart healthy diet such as MIND or DASH diet, increase exercise, avoid trans fats, simple carbohydrates and processed foods, consider a krill or fish or flaxseed oil cap daily.  °

## 2022-08-14 NOTE — Progress Notes (Unsigned)
Subjective:    Patient ID: [name], female    DOB: 04-03-1946, 76 y.o.   MRN: [id number]  No chief complaint on file.   HPI Patient is in today for follow up on chronic medical concerns. No recent febrile illness or acute hospitalizations. No complaints of polyuria or polydispia. Denies CP/palp/SOB/HA/congestion/fevers/GI or GU c/o. Taking meds as prescribed. Is trying to maintain a heart healthy diet. She is only taking Escitalopram 10 mg daily and doing well.   Past Medical History:  Diagnosis Date   Anxiety    Arthritis    Atrial fibrillation ([place]) 2008   CAD (coronary artery disease)    Crohn's colitis ([place])    she reports ulcerative colitis beginning in her 1's, biopsies 2008 suggest Crohn's not UC   Depression    Diverticulosis    GERD (gastroesophageal reflux disease)    Hyperlipidemia    Hypertension    IBS (irritable bowel syndrome)    Keloid of skin    Obesity    OSA (obstructive sleep apnea)    Pancreatitis    Paroxysmal atrial fibrillation (HCC)    Pseudoaneurysm of right femoral artery (HCC)    Renal oncocytoma    s/p right nephrectomy   Rheumatoid arthritis ([place]) 05/29/2017   UC (ulcerative colitis) ([place])     Past Surgical History:  Procedure Laterality Date   ANGIOPLASTY     BREAST BIOPSY  10/24/1994   benign lesion    Cataract surgery Left 11/13/2014   CHOLECYSTECTOMY  10/24/2006   COLONOSCOPY W/ BIOPSIES     CORONARY ANGIOPLASTY     ESOPHAGOGASTRODUODENOSCOPY     NEPHRECTOMY  10/24/2006   right secondary to oncocytoma    RIGHT FEMORAL  PSEUDOANEURYSM & RIGHT GROIN HEMATOMA EVACUATION  02/22/2007   RIGHT RENAL ARTERY REPAIR  10/24/1974   TUBAL LIGATION      Family History  Problem Relation Age of Onset   Hypertension Mother    Coronary artery disease Mother    Stroke Mother    Clotting disorder Mother    Heart disease Mother    Allergies Mother    Colitis Father    Heart disease Father    Clotting disorder Sister    Stroke  Brother    Clotting disorder Brother    Cancer Daughter    Asthma Daughter    Asthma Maternal Uncle    Colon cancer Paternal Aunt    Colon polyps Neg Hx    Kidney disease Neg Hx    Stomach cancer Neg Hx    Esophageal cancer Neg Hx     Social History   Socioeconomic History   Marital status: Married    Spouse name: Not on file   Number of children: 3   Years of education: Not on file   Highest education level: Not on file  Occupational History   Occupation: Retired    Fish farm manager: RETIRED    Comment: retired  Tobacco Use   Smoking status: Never   Smokeless tobacco: Never  Vaping Use   Vaping Use: Never used  Substance and Sexual Activity   Alcohol use: No   Drug use: No   Sexual activity: Never  Other Topics Concern   Not on file  Social History Narrative   Married since 1965 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, 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 - adults, 2 grandchildrenNo pets -Daughter has breast cancer   Social Determinants of Health   Financial Resource Strain: Low Risk  (05/24/2021)   Overall  Financial Resource Strain (CARDIA)    Difficulty of Paying Living Expenses: Not hard at all  Food Insecurity: No Food Insecurity (05/24/2021)   Hunger Vital Sign    Worried About Running Out of Food in the Last Year: Never true    Ran Out of Food in the Last Year: Never true  Transportation Needs: No Transportation Needs (05/24/2021)   PRAPARE - Hydrologist (Medical): No    Lack of Transportation (Non-Medical): No  Physical Activity: Inactive (05/24/2021)   Exercise Vital Sign    Days of Exercise per Week: 0 days    Minutes of Exercise per Session: 0 min  Stress: No Stress Concern Present (05/24/2021)   [place]    Feeling of Stress : Not at all  Social Connections: Moderately Isolated (05/24/2021)   Social Connection and Isolation Panel [NHANES]    Frequency of Communication with  Friends and Family: More than three times a week    Frequency of Social Gatherings with Friends and Family: More than three times a week    Attends Religious Services: Never    Marine scientist or Organizations: No    Attends Archivist Meetings: Never    Marital Status: Married  Human resources officer Violence: Not At Risk (05/24/2021)   Humiliation, Afraid, Rape, and Kick questionnaire    Fear of Current or Ex-Partner: No    Emotionally Abused: No    Physically Abused: No    Sexually Abused: No    Outpatient Medications Prior to Visit  Medication Sig Dispense Refill   amLODipine (NORVASC) 10 MG tablet TAKE 1 TABLET BY MOUTH EVERY DAY 90 tablet 2   buPROPion (WELLBUTRIN XL) 150 MG 24 hr tablet TAKE 1 TABLET BY MOUTH EVERY DAY FOR 7 DAYS THEN INCREASE TO 2 TABLETS DAILY 180 tablet 0   dextrose 5 % SOLN 50 mL with methotrexate 25 MG/ML (PF) SOLN 40 mg/m2 Inject 40 mg/m2 into the vein once a week. Every wednesday     dofetilide (TIKOSYN) 125 MCG capsule TAKE 3 CAPSULES BY MOUTH TWICE A DAY 540 capsule 1   doxazosin (CARDURA) 2 MG tablet TAKE 1 TABLET BY MOUTH EVERY DAY 90 tablet 2   esomeprazole (NEXIUM) 40 MG capsule Take 1 capsule (40 mg total) by mouth daily. Take in the morning before breakfast. 30 capsule 11   folic acid (FOLVITE) 1 MG tablet Take 2 mg by mouth daily.     inFLIXimab 10 mg/kg in sodium chloride 0.9 % Inject 10 mg/kg into the vein every 6 (six) weeks.     loperamide (IMODIUM A-D) 2 MG tablet Take 1 mg by mouth as needed. Dihrrhea     metoprolol tartrate (LOPRESSOR) 25 MG tablet TAKE 1 TABLET (25 MG TOTAL) BY MOUTH IN THE MORNING AND AT BEDTIME. 180 tablet 3   Multiple Vitamin (MULTIVITAMIN) capsule Take 1 capsule by mouth daily. 1 daily     nitroGLYCERIN (NITROSTAT) 0.4 MG SL tablet Place 1 tablet (0.4 mg total) under the tongue every 5 (five) minutes as needed for chest pain. Do not exceed 3 tabs in 15 minutes 75 tablet 3   Probiotic Product ([place]) CAPS Take 1 capsule by mouth daily. 1 per day     TUBERCULIN SYR 1CC/27GX1/2" 27G X 1/2" 1 ML MISC      valsartan (DIOVAN) 160 MG tablet Take 1 tablet (160 mg total) by mouth daily. 90 tablet 3  No facility-administered medications prior to visit.    Allergies  Allergen Reactions   Hydralazine Diarrhea and Nausea Only    Anorexia, upset stomach, burning pain in abdomin   Cefdinir     Pt cannot take; may interfere with AFib.   Codeine     Heart beats fast    Fosamax [Alendronate Sodium] Other (See Comments)    GI upset   Guaifenesin Er Nausea And Vomiting and Other (See Comments)    Headaches; can tolerate liquid.   Latex Other (See Comments)    Breathing problems   Mucilgen [Psyllium]    Percocet [Oxycodone-Acetaminophen]     Itching & swelling in jaw    Review of Systems  Constitutional:  Negative for chills, fever and malaise/fatigue.  HENT:  Negative for congestion and hearing loss.   Eyes:  Negative for discharge.  Respiratory:  Negative for cough, sputum production and shortness of breath.   Cardiovascular:  Negative for chest pain, palpitations and leg swelling.  Gastrointestinal:  Negative for abdominal pain, blood in stool, constipation, diarrhea, heartburn, nausea and vomiting.  Genitourinary:  Negative for dysuria, frequency, hematuria and urgency.  Musculoskeletal:  Negative for back pain, falls and myalgias.  Skin:  Negative for rash.  Neurological:  Negative for dizziness, sensory change, loss of consciousness, weakness and headaches.  Endo/Heme/Allergies:  Negative for environmental allergies. Does not bruise/bleed easily.  Psychiatric/Behavioral:  Negative for depression and suicidal ideas. The patient is not nervous/anxious and does not have insomnia.        Objective:    Physical Exam Constitutional:      General: She is not in acute distress.    Appearance: She is well-developed.  HENT:     Head: Normocephalic and atraumatic.  Eyes:      Conjunctiva/sclera: Conjunctivae normal.  Neck:     Thyroid: No thyromegaly.  Cardiovascular:     Rate and Rhythm: Normal rate and regular rhythm.     Heart sounds: Normal heart sounds. No murmur heard. Pulmonary:     Effort: Pulmonary effort is normal. No respiratory distress.     Breath sounds: Normal breath sounds.  Abdominal:     General: Bowel sounds are normal. There is no distension.     Palpations: Abdomen is soft. There is no mass.     Tenderness: There is no abdominal tenderness.  Musculoskeletal:     Cervical back: Neck supple.  Lymphadenopathy:     Cervical: No cervical adenopathy.  Skin:    General: Skin is warm and dry.  Neurological:     Mental Status: She is alert and oriented to person, place, and time.  Psychiatric:        Behavior: Behavior normal.     There were no vitals taken for this visit. Wt Readings from Last 3 Encounters:  07/06/22 247 lb (112 kg)  06/22/22 251 lb (113.9 kg)  03/17/22 250 lb 9.6 oz (113.7 kg)    Diabetic Foot Exam - Simple   No data filed    Lab Results  Component Value Date   WBC 6.7 03/17/2022   HGB 12.9 03/17/2022   HCT 38.6 03/17/2022   PLT 205.0 03/17/2022   GLUCOSE 119 (H) 06/22/2022   CHOL 155 02/10/2022   TRIG 93.0 02/10/2022   HDL 50.10 02/10/2022   LDLCALC 86 02/10/2022   ALT 16 02/10/2022   AST 20 02/10/2022   NA 140 06/22/2022   K 4.2 06/22/2022   CL 104 06/22/2022   CREATININE 1.11 (H)  06/22/2022   BUN 18 06/22/2022   CO2 22 06/22/2022   TSH 3.20 02/10/2022   INR 4.0 (A) 06/21/2022   HGBA1C 5.8 02/10/2022    Lab Results  Component Value Date   TSH 3.20 02/10/2022   Lab Results  Component Value Date   WBC 6.7 03/17/2022   HGB 12.9 03/17/2022   HCT 38.6 03/17/2022   MCV 98.7 03/17/2022   PLT 205.0 03/17/2022   Lab Results  Component Value Date   NA 140 06/22/2022   K 4.2 06/22/2022   CO2 22 06/22/2022   GLUCOSE 119 (H) 06/22/2022   BUN 18 06/22/2022   CREATININE 1.11 (H) 06/22/2022    BILITOT 0.5 02/10/2022   ALKPHOS 74 02/10/2022   AST 20 02/10/2022   ALT 16 02/10/2022   PROT 7.0 02/10/2022   ALBUMIN 3.7 02/10/2022   CALCIUM 9.2 06/22/2022   ANIONGAP 11 11/13/2015   EGFR 52 (L) 06/22/2022   GFR 46.25 (L) 03/17/2022   Lab Results  Component Value Date   CHOL 155 02/10/2022   Lab Results  Component Value Date   HDL 50.10 02/10/2022   Lab Results  Component Value Date   LDLCALC 86 02/10/2022   Lab Results  Component Value Date   TRIG 93.0 02/10/2022   Lab Results  Component Value Date   CHOLHDL 3 02/10/2022   Lab Results  Component Value Date   HGBA1C 5.8 02/10/2022       Assessment & Plan:   Problem List Items Addressed This Visit     Hyperlipidemia    Encourage heart healthy diet such as MIND or DASH diet, increase exercise, avoid trans fats, simple carbohydrates and processed foods, consider a krill or fish or flaxseed oil cap daily.       Obesity    Encouraged DASH or MIND diet, decrease po intake and increase exercise as tolerated. Needs 7-8 hours of sleep nightly. Avoid trans fats, eat small, frequent meals every 4-5 hours with lean proteins, complex carbs and healthy fats. Minimize simple carbs, high fat foods and processed foods      DEPRESSION/ANXIETY    Tolerating Bupropion      Essential hypertension    Well controlled, no changes to meds. Encouraged heart healthy diet such as the DASH diet and exercise as tolerated.       ATRIAL FIBRILLATION    Rate controlled, tolerating meds      Statin intolerance    Does not tolerate statins      Hypertension    Well controlled, no changes to meds. Encouraged heart healthy diet such as the DASH diet and exercise as tolerated.  RSV (respiratory syncitial virus) vaccine at pharmacy, Arexvy Covid booster when new version at pharmacy High dose flu shot  [place] is due Shingrix is the new shingles shot, 2 shots over 2-6 months, confirm coverage with insurance and document, then  can return here for shots with nurse appt or at pharmacy      Vitamin D deficiency    Supplement and monitor       I am having Avari Nevares. [place] maintain her loperamide, multivitamin, [facility], esomeprazole, nitroGLYCERIN, folic acid, dextrose 5 % SOLN 50 mL with methotrexate 25 MG/ML (PF) SOLN 40 mg/m2, TUBERCULIN SYR 1CC/27GX1/2", inFLIXimab 10 mg/kg in sodium chloride 0.9 %, metoprolol tartrate, valsartan, amLODipine, dofetilide, doxazosin, and buPROPion.  No orders of the defined types were placed in this encounter.    Penni Homans, MD

## 2022-08-14 NOTE — Assessment & Plan Note (Signed)
Encouraged DASH or MIND diet, decrease po intake and increase exercise as tolerated. Needs 7-8 hours of sleep nightly. Avoid trans fats, eat small, frequent meals every 4-5 hours with lean proteins, complex carbs and healthy fats. Minimize simple carbs, high fat foods and processed foods 

## 2022-08-14 NOTE — Assessment & Plan Note (Signed)
Supplement and monitor 

## 2022-08-14 NOTE — Assessment & Plan Note (Signed)
Well controlled, no changes to meds. Encouraged heart healthy diet such as the DASH diet and exercise as tolerated.  RSV (respiratory syncitial virus) vaccine at pharmacy, Arexvy Covid booster when new version at pharmacy High dose flu shot  [place] is due Shingrix is the new shingles shot, 2 shots over 2-6 months, confirm coverage with insurance and document, then can return here for shots with nurse appt or at pharmacy

## 2022-08-14 NOTE — Assessment & Plan Note (Signed)
Does not tolerate statins.

## 2022-08-14 NOTE — Assessment & Plan Note (Signed)
Tolerating Bupropion

## 2022-08-15 ENCOUNTER — Ambulatory Visit (INDEPENDENT_AMBULATORY_CARE_PROVIDER_SITE_OTHER): Payer: Medicare Other | Admitting: Family Medicine

## 2022-08-15 VITALS — BP 128/64 | HR 71 | Temp 97.9°F | Resp 16 | Ht 63.0 in | Wt 251.0 lb

## 2022-08-15 DIAGNOSIS — Z789 Other specified health status: Secondary | ICD-10-CM | POA: Diagnosis not present

## 2022-08-15 DIAGNOSIS — R739 Hyperglycemia, unspecified: Secondary | ICD-10-CM

## 2022-08-15 DIAGNOSIS — I2510 Atherosclerotic heart disease of native coronary artery without angina pectoris: Secondary | ICD-10-CM | POA: Diagnosis not present

## 2022-08-15 DIAGNOSIS — Z23 Encounter for immunization: Secondary | ICD-10-CM

## 2022-08-15 DIAGNOSIS — E6609 Other obesity due to excess calories: Secondary | ICD-10-CM | POA: Diagnosis not present

## 2022-08-15 DIAGNOSIS — D0471 Carcinoma in situ of skin of right lower limb, including hip: Secondary | ICD-10-CM | POA: Diagnosis not present

## 2022-08-15 DIAGNOSIS — E7800 Pure hypercholesterolemia, unspecified: Secondary | ICD-10-CM | POA: Diagnosis not present

## 2022-08-15 DIAGNOSIS — D649 Anemia, unspecified: Secondary | ICD-10-CM

## 2022-08-15 DIAGNOSIS — D485 Neoplasm of uncertain behavior of skin: Secondary | ICD-10-CM | POA: Diagnosis not present

## 2022-08-15 DIAGNOSIS — I10 Essential (primary) hypertension: Secondary | ICD-10-CM | POA: Diagnosis not present

## 2022-08-15 DIAGNOSIS — F341 Dysthymic disorder: Secondary | ICD-10-CM | POA: Diagnosis not present

## 2022-08-15 DIAGNOSIS — E559 Vitamin D deficiency, unspecified: Secondary | ICD-10-CM

## 2022-08-15 DIAGNOSIS — I4891 Unspecified atrial fibrillation: Secondary | ICD-10-CM | POA: Diagnosis not present

## 2022-08-15 DIAGNOSIS — I159 Secondary hypertension, unspecified: Secondary | ICD-10-CM

## 2022-08-15 DIAGNOSIS — D0430 Carcinoma in situ of skin of unspecified part of face: Secondary | ICD-10-CM | POA: Diagnosis not present

## 2022-08-15 DIAGNOSIS — L570 Actinic keratosis: Secondary | ICD-10-CM | POA: Diagnosis not present

## 2022-08-15 MED ORDER — ESCITALOPRAM OXALATE 10 MG PO TABS: 10.0000 mg | ORAL_TABLET | Freq: Every day | ORAL | 1 refills | Status: DC

## 2022-08-15 NOTE — Assessment & Plan Note (Signed)
hgba1c acceptable, minimize simple carbs. Increase exercise as tolerated.  

## 2022-08-15 NOTE — Patient Instructions (Addendum)
RSV (respiratory syncitial virus) vaccine at pharmacy, Arexvy Covid booster when new version at pharmacy High dose flu shot given today  Anemia  Anemia is a condition in which there are not enough red blood cells or hemoglobin in the blood. Hemoglobin is a substance in red blood cells that carries oxygen. When you do not have enough red blood cells or hemoglobin (are anemic), your body cannot get enough oxygen, and your organs may not work properly. As a result, you may feel very tired or have other problems. What are the causes? Common causes of anemia include: Excessive bleeding. Anemia can be caused by excessive bleeding inside or outside the body, including bleeding from the intestines or from heavy menstrual periods in females. Poor nutrition. Long-lasting (chronic) kidney, thyroid, and liver disease. Bone marrow disorders, spleen problems, and blood disorders. Cancer and treatments for cancer. Human immunodeficiency virus (HIV) and acquired immunodeficiency syndrome (AIDS). Infections, medicines, and autoimmune disorders that destroy red blood cells. What are the signs or symptoms? Symptoms of this condition include: Minor weakness. Dizziness. Headache, or difficulties concentrating and sleeping. Heartbeats that feel irregular or faster than normal (palpitations). Shortness of breath, especially with exercise. Pale skin, lips, and nails, or cold hands and feet. Upset stomach (indigestion) and nausea. Symptoms may occur suddenly or develop slowly. If your anemia is mild, you may not have symptoms. How is this diagnosed? This condition is diagnosed based on blood tests, your medical history, and a physical exam. In some cases, a test may be needed in which cells are removed from the soft tissue inside of a bone and looked at under a microscope (bone marrow biopsy). Your health care provider may also check your stool (feces) for blood and may do more testing to look for the cause of  your bleeding. Other tests may include: Imaging tests, such as a CT scan or MRI. A procedure to see inside your esophagus and stomach (endoscopy). The esophagus is the part of the body that moves food from your mouth to your stomach. A procedure to see inside your colon and rectum (colonoscopy). How is this treated? Treatment for this condition depends on the cause. If you continue to lose a lot of blood, you may need to be treated at a hospital. Treatment may include: Taking supplements of iron, vitamin T62, or folic acid. Taking a hormone medicine (erythropoietin) that can help to stimulate red blood cell growth. Receiving donated blood through an IV (blood transfusion). This may be needed if you lose a lot of blood. Making changes to your diet. Having surgery to remove your spleen. Follow these instructions at home: Take over-the-counter and prescription medicines only as told by your health care provider. Take supplements only as told by your health care provider. Follow any diet instructions that you were given by your health care provider. Keep all follow-up visits. Your health care provider will want to recheck your blood tests. Contact a health care provider if: You develop new bleeding anywhere in the body. You are very weak. Get help right away if: You are short of breath. You have pain in your abdomen or chest. You are dizzy or feel faint. You have trouble concentrating. You have bloody stools, black stools, or tarry stools. You vomit repeatedly or you vomit up blood. These symptoms may be an emergency. Get help right away. Call 911. Do not wait to see if the symptoms will go away. Do not drive yourself to the hospital. Summary Anemia is a condition in  which you do not have enough red blood cells or enough of a substance in your red blood cells that carries oxygen. Symptoms may occur suddenly or develop slowly. If your anemia is mild, you may not have symptoms. This  condition is diagnosed with blood tests, a medical history, and a physical exam. Other tests may be needed. Treatment for this condition depends on the cause of the anemia. This information is not intended to replace advice given to you by your health care provider. Make sure you discuss any questions you have with your health care provider. Document Revised: 01/03/2022 Document Reviewed: 01/03/2022 Elsevier Patient Education  [place].

## 2022-08-16 LAB — COMPREHENSIVE METABOLIC PANEL
ALT: 14 U/L (ref 0–35)
AST: 18 U/L (ref 0–37)
Albumin: 3.9 g/dL (ref 3.5–5.2)
Alkaline Phosphatase: 86 U/L (ref 39–117)
BUN: 18 mg/dL (ref 6–23)
CO2: 28 mEq/L (ref 19–32)
Calcium: 9.1 mg/dL (ref 8.4–10.5)
Chloride: 103 mEq/L (ref 96–112)
Creatinine, Ser: 1.23 mg/dL — ABNORMAL HIGH (ref 0.40–1.20)
GFR: 42.54 mL/min — ABNORMAL LOW (ref >60.00)
Glucose, Bld: 97 mg/dL (ref 70–99)
Potassium: 4 mEq/L (ref 3.5–5.1)
Sodium: 140 mEq/L (ref 135–145)
Total Bilirubin: 0.5 mg/dL (ref 0.2–1.2)
Total Protein: 7.4 g/dL (ref 6.0–8.3)

## 2022-08-16 LAB — IRON,TIBC AND FERRITIN PANEL
%SAT: 20 % (calc) (ref 16–45)
Ferritin: 44 ng/mL (ref 16–288)
Iron: 72 ug/dL (ref 45–160)
TIBC: 354 mcg/dL (calc) (ref 250–450)

## 2022-08-16 LAB — LIPID PANEL
Cholesterol: 158 mg/dL (ref 0–200)
HDL: 51 mg/dL (ref >39.00)
LDL Cholesterol: 83 mg/dL (ref 0–99)
NonHDL: 106.72
Total CHOL/HDL Ratio: 3
Triglycerides: 120 mg/dL (ref 0.0–149.0)
VLDL: 24 mg/dL (ref 0.0–40.0)

## 2022-08-16 LAB — CBC
HCT: 38.4 % (ref 36.0–46.0)
Hemoglobin: 12.5 g/dL (ref 12.0–15.0)
MCHC: 32.4 g/dL (ref 30.0–36.0)
MCV: 100.4 fl — ABNORMAL HIGH (ref 78.0–100.0)
Platelets: 217 10*3/uL (ref 150.0–400.0)
RBC: 3.82 Mil/uL — ABNORMAL LOW (ref 3.87–5.11)
RDW: 15.8 % — ABNORMAL HIGH (ref 11.5–15.5)
WBC: 9.4 10*3/uL (ref 4.0–10.5)

## 2022-08-16 LAB — VITAMIN D 25 HYDROXY (VIT D DEFICIENCY, FRACTURES): VITD: 38.99 ng/mL (ref 30.00–100.00)

## 2022-08-16 LAB — TSH: TSH: 2.58 u[IU]/mL (ref 0.35–5.50)

## 2022-08-16 LAB — HEMOGLOBIN A1C: Hgb A1c MFr Bld: 5.7 % (ref 4.6–6.5)

## 2022-08-17 ENCOUNTER — Ambulatory Visit (HOSPITAL_BASED_OUTPATIENT_CLINIC_OR_DEPARTMENT_OTHER)
Admission: RE | Admit: 2022-08-17 | Discharge: 2022-08-17 | Disposition: A | Discharge status: Home / Self Care | Payer: Medicare Other | Source: Ambulatory Visit | Attending: Family Medicine | Admitting: Family Medicine

## 2022-08-17 ENCOUNTER — Encounter (HOSPITAL_BASED_OUTPATIENT_CLINIC_OR_DEPARTMENT_OTHER): Payer: Self-pay

## 2022-08-17 DIAGNOSIS — Z1231 Encounter for screening mammogram for malignant neoplasm of breast: Secondary | ICD-10-CM | POA: Diagnosis not present

## 2022-08-19 ENCOUNTER — Encounter: Payer: Self-pay | Admitting: Cardiology

## 2022-08-19 NOTE — Telephone Encounter (Signed)
Patient stated since being on eliquis since August 2023, she has knee and back pain and has no energy. Spoke with K. Alvstad PharmD who recommended Xarelto '20mg'$  daily with big meal. Patient stated she cannot take Xarelto due to colon issues. She stated she will stay on eliquis and wait to hear from [name]. No signs of bleeding. She also asked about taking Tikosyn and lexapro together (her pharmacist said she shouldn't take them together). PharmD check QT interval and approved both meds that patient has been on for years. Please advise of Eliquis.

## 2022-08-25 ENCOUNTER — Encounter: Payer: Self-pay | Admitting: Family Medicine

## 2022-08-26 ENCOUNTER — Telehealth: Payer: Self-pay

## 2022-08-26 DIAGNOSIS — I4891 Unspecified atrial fibrillation: Secondary | ICD-10-CM

## 2022-08-26 MED ORDER — WARFARIN SODIUM 5 MG PO TABS: ORAL_TABLET | ORAL | 0 refills | Status: DC

## 2022-08-26 NOTE — Telephone Encounter (Signed)
Per Dr Stanford Breed in patient messages on 10/27, okay for pt to switch to Coumadin.   Called pt and discussed switching from Eliquis to Warfarin. Pt stated she would start Warfarin on Sunday, 08/28/22. Instructed pt to START Warfarin '5mg'$  daily and continue Eliquis for three days (11/5, 11/6, & 11/7). Then, discontinue Eliquis and continue Warfarin '5mg'$  daily. Scheduled pt an anticoagulation clinic appt on 09/06/22 at 2:30pm. Educated pt on the importance of staying consistent with greens each week, avoid missing doses, and make coumadin clinic aware if she is prescribed steroids or antibiotics. Pt verbalized understanding. Prescription sent to requested pharmacy.

## 2022-08-26 NOTE — Telephone Encounter (Signed)
Called pt and dicussed switching from Eliquis to Warfarin (refer to telephone note on 08/26/22). Coumadin Clinic appt scheduled. Prescription for Warfarin sent to requested pharmacy.

## 2022-09-06 ENCOUNTER — Ambulatory Visit: Payer: Medicare Other | Attending: Cardiology

## 2022-09-06 DIAGNOSIS — Z5181 Encounter for therapeutic drug level monitoring: Secondary | ICD-10-CM

## 2022-09-06 DIAGNOSIS — I4891 Unspecified atrial fibrillation: Secondary | ICD-10-CM | POA: Diagnosis not present

## 2022-09-07 ENCOUNTER — Telehealth: Payer: Self-pay | Admitting: Student

## 2022-09-07 LAB — PROTIME-INR
INR: 8 (ref 0.9–1.2)
Prothrombin Time: 74.6 s — ABNORMAL HIGH (ref 9.1–12.0)

## 2022-09-07 NOTE — Telephone Encounter (Signed)
Telephone encounter:  Called for INR 8.0. Patient takes warfarin 5 mg qD.  Plan: - Will route this to Lisette Abu, RN - Had advised patient to skip today's dose of warfarin, but she said she wanted to speak to [facility] about it, and that Vernie Shanks would call her today  Loel Dubonnet MD MPH [facility]

## 2022-09-07 NOTE — Patient Instructions (Signed)
Description   Lab INR 8.0  Called pt and instructed to continue to HOLD Warfarin today, Thursday, and Friday. Then,  START taking 0.5 tablet daily.  Made pt aware that she should seek immediate medication attention if signs or symptoms of bleeding occur.  Repeat INR in 1 week.  Coumadin Clinic [phone]

## 2022-09-07 NOTE — Telephone Encounter (Signed)
Called pt and provided Warfarin dosing instructions. Scheduled appt with Coumadin Clinic for 09/13/22 in [place]. Pt verbalized understanding.

## 2022-09-12 DIAGNOSIS — M0589 Other rheumatoid arthritis with rheumatoid factor of multiple sites: Secondary | ICD-10-CM | POA: Diagnosis not present

## 2022-09-13 ENCOUNTER — Ambulatory Visit: Payer: Medicare Other | Attending: Cardiology

## 2022-09-13 DIAGNOSIS — Z5181 Encounter for therapeutic drug level monitoring: Secondary | ICD-10-CM | POA: Diagnosis not present

## 2022-09-13 DIAGNOSIS — I4891 Unspecified atrial fibrillation: Secondary | ICD-10-CM

## 2022-09-13 LAB — POCT INR: INR: 2.6 (ref 2.0–3.0)

## 2022-09-13 NOTE — Patient Instructions (Signed)
Description   Continue taking 0.5 tablet daily.   Stay consistent with green each week Repeat INR in 1 week.  Coumadin Clinic [phone]

## 2022-09-19 ENCOUNTER — Other Ambulatory Visit: Payer: Self-pay | Admitting: Cardiology

## 2022-09-19 DIAGNOSIS — I4891 Unspecified atrial fibrillation: Secondary | ICD-10-CM

## 2022-09-20 ENCOUNTER — Ambulatory Visit: Payer: Medicare Other | Attending: Cardiology

## 2022-09-20 DIAGNOSIS — Z5181 Encounter for therapeutic drug level monitoring: Secondary | ICD-10-CM

## 2022-09-20 DIAGNOSIS — I4891 Unspecified atrial fibrillation: Secondary | ICD-10-CM | POA: Diagnosis not present

## 2022-09-20 LAB — POCT INR: INR: 3.3 — AB (ref 2.0–3.0)

## 2022-09-20 MED ORDER — WARFARIN SODIUM 2.5 MG PO TABS: ORAL_TABLET | ORAL | 0 refills | Status: DC

## 2022-09-20 NOTE — Patient Instructions (Addendum)
Description   Only take 1.'25mg'$  today and 1.'25mg'$  tomorrow then START taking 2.'5mg'$  daily EXCEPT 1.'25mg'$  on Sundays, Tuesdays, and Thursdays.  Stay consistent with green each week Repeat INR in 1 week.  Coumadin Clinic [phone]

## 2022-09-27 ENCOUNTER — Ambulatory Visit: Payer: Medicare Other | Attending: Cardiology

## 2022-09-27 DIAGNOSIS — Z5181 Encounter for therapeutic drug level monitoring: Secondary | ICD-10-CM

## 2022-09-27 DIAGNOSIS — I4891 Unspecified atrial fibrillation: Secondary | ICD-10-CM

## 2022-09-27 LAB — POCT INR: INR: 2.4 (ref 2.0–3.0)

## 2022-09-27 NOTE — Patient Instructions (Signed)
Description   Continue taking 2.'5mg'$  daily EXCEPT 1.'25mg'$  on Sundays, Tuesdays, and Thursdays.  Stay consistent with green each week  (2 per week)  Repeat INR in 1 week.  Coumadin Clinic [phone]

## 2022-10-04 ENCOUNTER — Ambulatory Visit: Payer: Medicare Other | Attending: Cardiology

## 2022-10-04 DIAGNOSIS — I4891 Unspecified atrial fibrillation: Secondary | ICD-10-CM

## 2022-10-04 DIAGNOSIS — Z5181 Encounter for therapeutic drug level monitoring: Secondary | ICD-10-CM

## 2022-10-04 LAB — POCT INR: INR: 2.4 (ref 2.0–3.0)

## 2022-10-04 NOTE — Patient Instructions (Signed)
Description   Continue taking 2.'5mg'$  daily EXCEPT 1.'25mg'$  on Sundays, Tuesdays, and Thursdays.  Stay consistent with green each week  (2 per week)  Repeat INR in 3 weeks.  Coumadin Clinic [phone]

## 2022-10-20 DIAGNOSIS — C44311 Basal cell carcinoma of skin of nose: Secondary | ICD-10-CM | POA: Diagnosis not present

## 2022-10-20 DIAGNOSIS — L578 Other skin changes due to chronic exposure to nonionizing radiation: Secondary | ICD-10-CM | POA: Diagnosis not present

## 2022-10-20 DIAGNOSIS — L821 Other seborrheic keratosis: Secondary | ICD-10-CM | POA: Diagnosis not present

## 2022-10-20 DIAGNOSIS — L400 Psoriasis vulgaris: Secondary | ICD-10-CM | POA: Diagnosis not present

## 2022-10-25 ENCOUNTER — Ambulatory Visit: Payer: Medicare Other | Attending: Cardiology

## 2022-10-25 DIAGNOSIS — M0589 Other rheumatoid arthritis with rheumatoid factor of multiple sites: Secondary | ICD-10-CM | POA: Diagnosis not present

## 2022-10-25 DIAGNOSIS — Z5181 Encounter for therapeutic drug level monitoring: Secondary | ICD-10-CM | POA: Diagnosis not present

## 2022-10-25 DIAGNOSIS — I4891 Unspecified atrial fibrillation: Secondary | ICD-10-CM | POA: Diagnosis not present

## 2022-10-25 DIAGNOSIS — Z79899 Other long term (current) drug therapy: Secondary | ICD-10-CM | POA: Diagnosis not present

## 2022-10-25 LAB — POCT INR: INR: 3.4 — AB (ref 2.0–3.0)

## 2022-10-25 NOTE — Patient Instructions (Signed)
Description   Hold today's dose and then continue taking 2.'5mg'$  daily EXCEPT 1.'25mg'$  on Sundays, Tuesdays, and Thursdays.  Stay consistent with green each week  (2 per week)  Repeat INR in 3 weeks.  Coumadin Clinic [phone]

## 2022-11-03 ENCOUNTER — Other Ambulatory Visit: Payer: Self-pay | Admitting: Cardiology

## 2022-11-03 DIAGNOSIS — I480 Paroxysmal atrial fibrillation: Secondary | ICD-10-CM

## 2022-11-08 ENCOUNTER — Ambulatory Visit (INDEPENDENT_AMBULATORY_CARE_PROVIDER_SITE_OTHER): Payer: Medicare Other | Admitting: Podiatry

## 2022-11-08 DIAGNOSIS — M79675 Pain in left toe(s): Secondary | ICD-10-CM | POA: Diagnosis not present

## 2022-11-08 DIAGNOSIS — M79674 Pain in right toe(s): Secondary | ICD-10-CM

## 2022-11-08 DIAGNOSIS — B351 Tinea unguium: Secondary | ICD-10-CM

## 2022-11-08 DIAGNOSIS — Z7901 Long term (current) use of anticoagulants: Secondary | ICD-10-CM

## 2022-11-08 DIAGNOSIS — I739 Peripheral vascular disease, unspecified: Secondary | ICD-10-CM

## 2022-11-08 NOTE — Progress Notes (Signed)
  Subjective:  Patient ID: [name], female    DOB: [birth year],  MRN: [id number]  Chief Complaint  Patient presents with   Nail Problem    Routine Foot Care     77 y.o. female presents with the above complaint. History confirmed with patient. Patient presenting with pain related to dystrophic thickened elongated nails. Patient is unable to trim own nails related to nail dystrophy and/or mobility issues. Patient does not have a history of T2DM but does have history PVD and has a clotting disorder.   Objective:  Physical Exam: warm, good capillary refill, DP and PT pulses 2/4 bilateral nail exam onychomycosis of the toenails, onycholysis, and dystrophic nails DP pulses palpable, PT pulses palpable, and protective sensation intact Left Foot:  Pain with palpation of nails due to elongation and dystrophic growth.  Right Foot: Pain with palpation of nails due to elongation and dystrophic growth.   Assessment:   1. Pain due to onychomycosis of toenails of both feet   2. PVD (peripheral vascular disease) ([place])   3. Current use of anticoagulant therapy       Plan:  Patient was evaluated and treated and all questions answered.  #Onychomycosis with pain  -Nails palliatively debrided as below. -Educated on self-care  Procedure: Nail Debridement Rationale: Pain Type of Debridement: manual, sharp debridement. Instrumentation: Nail nipper, rotary burr. Number of Nails: 10  Return in about 3 months (around 02/07/2023) for RFC.         Everitt Amber, DPM [facility]

## 2022-11-13 ENCOUNTER — Other Ambulatory Visit: Payer: Self-pay | Admitting: Cardiology

## 2022-11-15 ENCOUNTER — Ambulatory Visit: Payer: Medicare Other | Attending: Cardiology

## 2022-11-15 DIAGNOSIS — I4891 Unspecified atrial fibrillation: Secondary | ICD-10-CM | POA: Diagnosis not present

## 2022-11-15 DIAGNOSIS — Z5181 Encounter for therapeutic drug level monitoring: Secondary | ICD-10-CM | POA: Diagnosis not present

## 2022-11-15 LAB — POCT INR: INR: 3 (ref 2.0–3.0)

## 2022-11-15 NOTE — Patient Instructions (Signed)
Description   Continue taking 2.'5mg'$  daily EXCEPT 1.'25mg'$  on Sundays, Tuesdays, and Thursdays.  Stay consistent with green each week  (2 per week)  Repeat INR in 4 weeks.  Coumadin Clinic [phone]

## 2022-11-16 DIAGNOSIS — C44311 Basal cell carcinoma of skin of nose: Secondary | ICD-10-CM | POA: Diagnosis not present

## 2022-11-21 ENCOUNTER — Ambulatory Visit: Payer: Self-pay | Admitting: Licensed Clinical Social Worker

## 2022-11-21 NOTE — Patient Outreach (Signed)
  Care Coordination  Initial Visit Note   11/21/2022 Name: [name] MRN: [id number] DOB: Aug 11, 1946  [name] is a 77 y.o. year old female who sees [name], MD for primary care. I spoke with  [name] by phone today.  What matters to the patients health and wellness today?    Would like to discuss her mental health needs Patient scheduled phone appointment to obtain additional information about the Care Coordination program..   SDOH assessments and interventions completed:  No    Care Coordination Interventions:  No, not indicated   Follow up plan: Follow up call scheduled for 02/30/24    Encounter Outcome:  Pt. Visit Completed   Casimer Lanius, [place] [phone]

## 2022-11-21 NOTE — Patient Instructions (Signed)
    It was a pleasure speaking with you today. Per your request a Care Coordination phone appointment is scheduled 02/30/24  Casimer Lanius, [place] [phone]

## 2022-11-22 ENCOUNTER — Ambulatory Visit: Payer: Self-pay | Admitting: Licensed Clinical Social Worker

## 2022-11-22 NOTE — Patient Instructions (Signed)
Visit Information  Thank you for taking time to visit with me today. Please don't hesitate to contact me if I can be of assistance to you.   Following are the goals we discussed today:   Goals Addressed             This Visit's Progress    COMPLETED: Care Coordination Activities No Follow up Required       Care Coordination Interventions: Reviewed Care Coordination Services:Declined Assessed Social Determinants of Health Depression screen reviewed  Reviewed mental health medications and discussed importance of compliance: currently taking Lexapro  Provided EMMI education information on :Breathing to Relax and Coping with a Health Condition           Please call the care guide team at [phone] if you need to cancel or reschedule your appointment.    Patient verbalizes understanding of instructions and care plan provided today and agrees to view in [facility]. Active MyChart status and patient understanding of how to access instructions and care plan via MyChart confirmed with patient.     No further follow up required: By Care Coordination at this time  Casimer Lanius, [place] [phone]

## 2022-11-22 NOTE — Patient Outreach (Signed)
  Care Coordination  Initial Visit Note   11/22/2022 Name: [name] MRN: [id number] DOB: 10-04-1946  MAGALY POLLINA is a 77 y.o. year old female who sees [name], MD for primary care. I spoke with  Rosemary Holms by phone today.  What matters to the patients health and wellness today?    Patient reports symptoms of anxiety at times. She is managing well but is open to additional information  .    Goals Addressed             This Visit's Progress    COMPLETED: Care Coordination Activities No Follow up Required       Care Coordination Interventions: Reviewed Care Coordination Services:Declined Assessed Social Determinants of Health Depression screen reviewed  Reviewed mental health medications and discussed importance of compliance: currently taking Lexapro  Provided EMMI education information on :Breathing to Relax and Coping with a Health Condition          SDOH assessments and interventions completed:  Yes  SDOH Interventions Today    Flowsheet Row Most Recent Value  SDOH Interventions   Food Insecurity Interventions Intervention Not Indicated  Housing Interventions Intervention Not Indicated  Transportation Interventions Intervention Not Indicated  Utilities Interventions Intervention Not Indicated  Stress Interventions Intervention Not Indicated       Care Coordination Interventions:  Yes, provided  Interventions Today    Flowsheet Row Most Recent Value  Chronic Disease Discussed/Reviewed   Chronic disease discussed/reviewed during today's visit Other  [anxiety]  Education Interventions   Education Provided Provided Web-based Education, Provided Verbal Education  Provided Verbal Education On --  [Relaxation]  Mental Health Interventions   Mental Health Discussed/Reviewed Coping Strategies       Follow up plan: No further intervention required. Or desired by patient.   Encounter Outcome:  Pt. Visit Completed   Casimer Lanius,  [place] [phone]

## 2022-11-23 DIAGNOSIS — M7989 Other specified soft tissue disorders: Secondary | ICD-10-CM | POA: Diagnosis not present

## 2022-11-23 DIAGNOSIS — Z6841 Body Mass Index (BMI) 40.0 and over, adult: Secondary | ICD-10-CM | POA: Diagnosis not present

## 2022-11-23 DIAGNOSIS — Q600 Renal agenesis, unilateral: Secondary | ICD-10-CM | POA: Diagnosis not present

## 2022-11-23 DIAGNOSIS — K5090 Crohn's disease, unspecified, without complications: Secondary | ICD-10-CM | POA: Diagnosis not present

## 2022-11-23 DIAGNOSIS — H209 Unspecified iridocyclitis: Secondary | ICD-10-CM | POA: Diagnosis not present

## 2022-11-23 DIAGNOSIS — M0589 Other rheumatoid arthritis with rheumatoid factor of multiple sites: Secondary | ICD-10-CM | POA: Diagnosis not present

## 2022-11-23 DIAGNOSIS — C4492 Squamous cell carcinoma of skin, unspecified: Secondary | ICD-10-CM | POA: Diagnosis not present

## 2022-12-01 ENCOUNTER — Encounter (HOSPITAL_COMMUNITY): Payer: Self-pay | Admitting: *Deleted

## 2022-12-06 DIAGNOSIS — M0589 Other rheumatoid arthritis with rheumatoid factor of multiple sites: Secondary | ICD-10-CM | POA: Diagnosis not present

## 2022-12-13 ENCOUNTER — Ambulatory Visit: Payer: Medicare Other | Attending: Cardiology

## 2022-12-13 DIAGNOSIS — Z5181 Encounter for therapeutic drug level monitoring: Secondary | ICD-10-CM | POA: Diagnosis not present

## 2022-12-13 DIAGNOSIS — I4891 Unspecified atrial fibrillation: Secondary | ICD-10-CM | POA: Diagnosis not present

## 2022-12-13 LAB — POCT INR: INR: 2.7 (ref 2.0–3.0)

## 2022-12-13 NOTE — Patient Instructions (Signed)
Description   Continue taking 2.6m daily EXCEPT 1.243mon Sundays, Tuesdays, and Thursdays.  Stay consistent with green each week  (2 per week)  Repeat INR in 5 weeks.  [facility] #[phone]

## 2022-12-14 NOTE — Progress Notes (Signed)
HPI: FU coronary artery disease and atrial fibrillation. Last cardiac catheterization on Mar 06, 2007 showed an ejection fraction of 55%. There is nonobstructive plaque in the LAD, and circumflex and the right coronary artery was normal. 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 Also note the patient is extremely sensitive to Coumadin. MRA of the abdomen in September of 2012 showed no left renal artery stenosis and previous right nephrectomy. 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 Event monitor January 2018 showed sinus with PACs, PVCs and EAT. Carotid dopplers 11/21 showed 1-39 bilateral stenosis. Echo 11/21 showed normal LV function, grade 2 DD, moderate pulmonary hypertension, moderate LAE, mild RAE. Since I last saw her, she has some dyspnea on exertion but no orthopnea, PND, pedal edema, chest pain, palpitations or syncope.  Current Outpatient Medications  Medication Sig Dispense Refill   amLODipine (NORVASC) 10 MG tablet TAKE 1 TABLET BY MOUTH EVERY DAY 90 tablet 2   dextrose 5 % SOLN 50 mL with methotrexate 25 MG/ML (PF) SOLN 40 mg/m2 Inject 40 mg/m2 into the vein once a week. Every wednesday     dofetilide (TIKOSYN) 125 MCG capsule TAKE 3 CAPSULES BY MOUTH TWICE A DAY 540 capsule 1   doxazosin (CARDURA) 2 MG tablet TAKE 1 TABLET BY MOUTH EVERY DAY 90 tablet 2   escitalopram (LEXAPRO) 10 MG tablet Take 1 tablet (10 mg total) by mouth daily. 90 tablet 1   esomeprazole (NEXIUM) 40 MG capsule Take 1 capsule (40 mg total) by mouth daily. Take in the morning before breakfast. 30 capsule 11   folic acid (FOLVITE) 1 MG tablet Take 2 mg by mouth daily.     inFLIXimab 10 mg/kg in sodium chloride 0.9 % Inject 10 mg/kg into the vein every 6 (six) weeks.     loperamide (IMODIUM A-D) 2 MG tablet Take 1 mg by mouth as needed. Dihrrhea     metoprolol tartrate (LOPRESSOR) 25 MG  tablet TAKE 1 TABLET (25 MG TOTAL) BY MOUTH IN THE MORNING AND AT BEDTIME. 90 tablet 1   Multiple Vitamin (MULTIVITAMIN) capsule Take 1 capsule by mouth daily. 1 daily     nitroGLYCERIN (NITROSTAT) 0.4 MG SL tablet Place 1 tablet (0.4 mg total) under the tongue every 5 (five) minutes as needed for chest pain. Do not exceed 3 tabs in 15 minutes 75 tablet 3   Probiotic Product ([place]) CAPS Take 1 capsule by mouth daily. 1 per day     TUBERCULIN SYR 1CC/27GX1/2" 27G X 1/2" 1 ML MISC      valsartan (DIOVAN) 160 MG tablet Take 1 tablet (160 mg total) by mouth daily. 90 tablet 3   warfarin (COUMADIN) 2.5 MG tablet TAKE 1/2 TABLET TO 1 TABLET BY MOUTH DAILY AS DIRECTED BY COUMADIN CLINIC 75 tablet 0   No current facility-administered medications for this visit.     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[facility])    Renal oncocytoma    s/p right nephrectomy   Rheumatoid arthritis ([place]) 05/29/2017   UC (ulcerative colitis) ([place])     Past Surgical History:  Procedure Laterality Date   ANGIOPLASTY     BREAST BIOPSY  10/24/1994   benign lesion    Cataract surgery Left 11/13/2014   CHOLECYSTECTOMY  10/24/2006   COLONOSCOPY W/ BIOPSIES     CORONARY ANGIOPLASTY     ESOPHAGOGASTRODUODENOSCOPY     NEPHRECTOMY  10/24/2006   right secondary to [place]  02/22/2007   RIGHT RENAL ARTERY REPAIR  10/24/1974    TUBAL LIGATION      Social History   Socioeconomic History   Marital status: Married    Spouse name: Not on file   Number of children: 3   Years of education: Not on file   Highest education level: Not on file  Occupational History   Occupation: Retired    Fish farm manager: RETIRED    Comment: retired  Tobacco Use   Smoking status: Never   Smokeless tobacco: Never  Vaping Use   Vaping Use: Never used  Substance and Sexual Activity   Alcohol use: No   Drug use: No   Sexual activity: Never  Other Topics Concern   Not on file  Social History Narrative   Married since 1965 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, 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 - adults, 2 grandchildrenNo pets -Daughter has breast cancer   Social Determinants of Health   Financial Resource Strain: Low Risk  (05/24/2021)   Overall Financial Resource Strain (CARDIA)    Difficulty of Paying Living Expenses: Not hard at all  Food Insecurity: No Food Insecurity (11/22/2022)   Hunger Vital Sign    Worried About Running Out of Food in the Last Year: Never true    Ran Out of Food in the Last Year: Never true  Transportation Needs: No Transportation Needs (11/22/2022)   PRAPARE - Hydrologist (Medical): No    Lack of Transportation (Non-Medical): No  Physical Activity: Inactive (05/24/2021)   Exercise Vital Sign    Days of Exercise per Week: 0 days    Minutes of Exercise per Session: 0 min  Stress: No Stress Concern Present (11/22/2022)   [place]    Feeling of Stress : Only a little  Social Connections: Moderately Isolated (05/24/2021)   Social Connection and Isolation Panel [NHANES]    Frequency of Communication with Friends and Family: More than three times a week    Frequency of Social Gatherings with Friends and Family: More than three times a week    Attends Religious Services: Never    Marine scientist or Organizations:  No    Attends Archivist Meetings: Never    Marital Status: Married  Human resources officer Violence: Not At Risk (05/24/2021)   Humiliation, Afraid, Rape, and Kick questionnaire    Fear of Current or Ex-Partner: No    Emotionally Abused: No    Physically Abused: No    Sexually Abused: No    Family History  Problem Relation Age of Onset   Hypertension Mother    Coronary artery disease Mother    Stroke Mother    Clotting  disorder Mother    Heart disease Mother    Allergies Mother    Colitis Father    Heart disease Father    Clotting disorder Sister    Stroke Brother    Clotting disorder Brother    Cancer Daughter    Asthma Daughter    Asthma Maternal Uncle    Colon cancer Paternal Aunt    Colon polyps Neg Hx    Kidney disease Neg Hx    Stomach cancer Neg Hx    Esophageal cancer Neg Hx     ROS: Arthralgias but no fevers or chills, productive cough, hemoptysis, dysphasia, odynophagia, melena, hematochezia, dysuria, hematuria, rash, seizure activity, orthopnea, PND, pedal edema, claudication. Remaining systems are negative.  Physical Exam: Well-developed obese in no acute distress.  Skin is warm and dry.  HEENT is normal.  Neck is supple.  Chest is clear to auscultation with normal expansion.  Cardiovascular exam is regular rate and rhythm.  Abdominal exam nontender or distended. No masses palpated. Extremities show no edema. neuro grossly intact  ECG-sinus bradycardia at a rate of 50, normal axis, normal QT interval.  Personally reviewed  A/P  1 paroxysmal atrial fibrillation-patient remains in sinus rhythm today.  Will continue Tikosyn, Coumadin.  She did not tolerate DOAC's in the past.  Check potassium, renal function and magnesium.    2 hypertension-patient's blood pressure is controlled.  Continue present medications.  3 hyperlipidemia-patient is intolerant to statins and she has previously declined other lipid-lowering medications.  Continue diet.  4  coronary artery disease-she denies chest pain.  Continue medical therapy.  She is not on aspirin given need for apixaban.  Intolerant to statins.  5 carotid artery disease-mild on previous  Kirk Ruths, MD

## 2022-12-16 ENCOUNTER — Other Ambulatory Visit: Payer: Self-pay | Admitting: Cardiology

## 2022-12-16 DIAGNOSIS — I4891 Unspecified atrial fibrillation: Secondary | ICD-10-CM

## 2022-12-16 NOTE — Telephone Encounter (Signed)
Prescription refill request received for warfarin [facility]: 06/22/22 [name]  Next INR check: 01/17/23 Warfarin tablet strength: 2.'5mg'$   Appropriate dose. Refill sent.

## 2022-12-28 ENCOUNTER — Encounter: Payer: Self-pay | Admitting: Cardiology

## 2022-12-28 ENCOUNTER — Ambulatory Visit: Payer: Medicare Other | Attending: Cardiology | Admitting: Cardiology

## 2022-12-28 VITALS — BP 128/82 | HR 50 | Ht 63.0 in | Wt 255.4 lb

## 2022-12-28 DIAGNOSIS — E7800 Pure hypercholesterolemia, unspecified: Secondary | ICD-10-CM | POA: Diagnosis not present

## 2022-12-28 DIAGNOSIS — I2510 Atherosclerotic heart disease of native coronary artery without angina pectoris: Secondary | ICD-10-CM | POA: Insufficient documentation

## 2022-12-28 DIAGNOSIS — I480 Paroxysmal atrial fibrillation: Secondary | ICD-10-CM | POA: Diagnosis not present

## 2022-12-28 DIAGNOSIS — I10 Essential (primary) hypertension: Secondary | ICD-10-CM | POA: Insufficient documentation

## 2022-12-28 NOTE — Patient Instructions (Signed)
    Follow-Up: At [facility], you and your health needs are our priority.  As part of our continuing mission to provide you with exceptional heart care, we have created designated Provider Care Teams.  These Care Teams include your primary Cardiologist (physician) and Advanced Practice Providers (APPs -  Physician Assistants and Nurse Practitioners) who all work together to provide you with the care you need, when you need it.  We recommend signing up for the patient portal called "MyChart".  Sign up information is provided on this After Visit Summary.  MyChart is used to connect with patients for Virtual Visits (Telemedicine).  Patients are able to view lab/test results, encounter notes, upcoming appointments, etc.  Non-urgent messages can be sent to your provider as well.   To learn more about what you can do with MyChart, go to [email or web address].    Your next appointment:   6 month(s)  Provider:   Kirk Ruths, MD

## 2022-12-29 LAB — BASIC METABOLIC PANEL
BUN/Creatinine Ratio: 14 (ref 12–28)
BUN: 15 mg/dL (ref 8–27)
CO2: 22 mmol/L (ref 20–29)
Calcium: 9.3 mg/dL (ref 8.7–10.3)
Chloride: 103 mmol/L (ref 96–106)
Creatinine, Ser: 1.1 mg/dL — ABNORMAL HIGH (ref 0.57–1.00)
Glucose: 94 mg/dL (ref 70–99)
Potassium: 4.4 mmol/L (ref 3.5–5.2)
Sodium: 141 mmol/L (ref 134–144)
eGFR: 52 mL/min/{1.73_m2} — ABNORMAL LOW (ref >59)

## 2022-12-29 LAB — MAGNESIUM: Magnesium: 2.3 mg/dL (ref 1.6–2.3)

## 2023-01-17 ENCOUNTER — Ambulatory Visit: Payer: Medicare Other | Attending: Cardiology

## 2023-01-17 DIAGNOSIS — Z5181 Encounter for therapeutic drug level monitoring: Secondary | ICD-10-CM | POA: Diagnosis not present

## 2023-01-17 DIAGNOSIS — I4891 Unspecified atrial fibrillation: Secondary | ICD-10-CM

## 2023-01-17 DIAGNOSIS — Z79899 Other long term (current) drug therapy: Secondary | ICD-10-CM | POA: Diagnosis not present

## 2023-01-17 DIAGNOSIS — M0589 Other rheumatoid arthritis with rheumatoid factor of multiple sites: Secondary | ICD-10-CM | POA: Diagnosis not present

## 2023-01-17 LAB — POCT INR: INR: 2.7 (ref 2.0–3.0)

## 2023-01-17 NOTE — Patient Instructions (Signed)
Description   Continue taking 2.5mg  daily EXCEPT 1.25mg  on Sundays, Tuesdays, and Thursdays.  Stay consistent with green each week  (2 per week)  Repeat INR in 6 weeks.  [facility] [phone]

## 2023-01-25 ENCOUNTER — Other Ambulatory Visit (HOSPITAL_BASED_OUTPATIENT_CLINIC_OR_DEPARTMENT_OTHER): Payer: Self-pay

## 2023-01-25 MED ORDER — METHYLPREDNISOLONE 4 MG PO TBPK
ORAL_TABLET | ORAL | 0 refills | Status: DC
  Filled 2023-01-25: qty 21, 6d supply, fill #0

## 2023-01-26 ENCOUNTER — Encounter (HOSPITAL_BASED_OUTPATIENT_CLINIC_OR_DEPARTMENT_OTHER): Payer: Self-pay

## 2023-01-26 ENCOUNTER — Other Ambulatory Visit: Payer: Self-pay

## 2023-01-26 ENCOUNTER — Emergency Department (HOSPITAL_BASED_OUTPATIENT_CLINIC_OR_DEPARTMENT_OTHER)
Admission: EM | Admit: 2023-01-26 | Discharge: 2023-01-27 | Disposition: A | Discharge status: Home / Self Care | Payer: Medicare Other | Attending: Emergency Medicine | Admitting: Emergency Medicine

## 2023-01-26 ENCOUNTER — Emergency Department (HOSPITAL_BASED_OUTPATIENT_CLINIC_OR_DEPARTMENT_OTHER): Payer: Medicare Other

## 2023-01-26 DIAGNOSIS — I2510 Atherosclerotic heart disease of native coronary artery without angina pectoris: Secondary | ICD-10-CM | POA: Diagnosis not present

## 2023-01-26 DIAGNOSIS — R079 Chest pain, unspecified: Secondary | ICD-10-CM | POA: Diagnosis not present

## 2023-01-26 DIAGNOSIS — Z91040 Latex allergy status: Secondary | ICD-10-CM | POA: Diagnosis not present

## 2023-01-26 DIAGNOSIS — Z79899 Other long term (current) drug therapy: Secondary | ICD-10-CM | POA: Insufficient documentation

## 2023-01-26 DIAGNOSIS — Z7901 Long term (current) use of anticoagulants: Secondary | ICD-10-CM | POA: Insufficient documentation

## 2023-01-26 DIAGNOSIS — R002 Palpitations: Secondary | ICD-10-CM | POA: Diagnosis not present

## 2023-01-26 DIAGNOSIS — I4891 Unspecified atrial fibrillation: Secondary | ICD-10-CM | POA: Insufficient documentation

## 2023-01-26 DIAGNOSIS — R072 Precordial pain: Secondary | ICD-10-CM | POA: Diagnosis present

## 2023-01-26 DIAGNOSIS — I10 Essential (primary) hypertension: Secondary | ICD-10-CM | POA: Diagnosis not present

## 2023-01-26 DIAGNOSIS — J9811 Atelectasis: Secondary | ICD-10-CM | POA: Diagnosis not present

## 2023-01-26 LAB — CBC
HCT: 40.1 % (ref 36.0–46.0)
Hemoglobin: 12.9 g/dL (ref 12.0–15.0)
MCH: 31.6 pg (ref 26.0–34.0)
MCHC: 32.2 g/dL (ref 30.0–36.0)
MCV: 98.3 fL (ref 80.0–100.0)
Platelets: 211 10*3/uL (ref 150–400)
RBC: 4.08 MIL/uL (ref 3.87–5.11)
RDW: 15 % (ref 11.5–15.5)
WBC: 11.9 10*3/uL — ABNORMAL HIGH (ref 4.0–10.5)
nRBC: 0 % (ref 0.0–0.2)

## 2023-01-26 LAB — BASIC METABOLIC PANEL
Anion gap: 11 (ref 5–15)
BUN: 18 mg/dL (ref 8–23)
CO2: 21 mmol/L — ABNORMAL LOW (ref 22–32)
Calcium: 9.1 mg/dL (ref 8.9–10.3)
Chloride: 103 mmol/L (ref 98–111)
Creatinine, Ser: 1.24 mg/dL — ABNORMAL HIGH (ref 0.44–1.00)
GFR, Estimated: 45 mL/min — ABNORMAL LOW (ref >60)
Glucose, Bld: 159 mg/dL — ABNORMAL HIGH (ref 70–99)
Potassium: 4 mmol/L (ref 3.5–5.1)
Sodium: 135 mmol/L (ref 135–145)

## 2023-01-26 LAB — TROPONIN I (HIGH SENSITIVITY): Troponin I (High Sensitivity): 105 ng/L (ref <18)

## 2023-01-26 LAB — PROTIME-INR
INR: 2.8 — ABNORMAL HIGH (ref 0.8–1.2)
Prothrombin Time: 29.3 seconds — ABNORMAL HIGH (ref 11.4–15.2)

## 2023-01-26 MED ORDER — ETOMIDATE 2 MG/ML IV SOLN
INTRAVENOUS | Status: AC | PRN
  Administered 2023-01-26: 10 mg via INTRAVENOUS

## 2023-01-26 MED ORDER — ETOMIDATE 2 MG/ML IV SOLN
10.0000 mg | Freq: Once | INTRAVENOUS | Status: DC
  Filled 2023-01-26: qty 10

## 2023-01-26 NOTE — ED Provider Notes (Signed)
[facility] [place] [facility] Provider Note   CSN: [id number] Arrival date & time: 01/26/23  2057     History  Chief Complaint  Patient presents with   Chest Pain    [name] is a 77 y.o. female.  The history is provided by the patient, medical records, a relative and the spouse. No language interpreter was used.  Chest Pain Pain location:  Substernal area Pain quality: pressure   Pain radiates to:  Does not radiate Pain severity:  Mild Onset quality:  Sudden Duration: 7:30 pm. Timing:  Constant Progression:  Waxing and waning Chronicity:  Recurrent Relieved by:  Nothing Worsened by:  Nothing Associated symptoms: palpitations and shortness of breath   Associated symptoms: no abdominal pain, no back pain, no cough, no diaphoresis, no fatigue, no fever, no headache, no lower extremity edema, no nausea, no near-syncope and no syncope        Home Medications Prior to Admission medications   Medication Sig Start Date End Date Taking? Authorizing Provider  amLODipine (NORVASC) 10 MG tablet TAKE 1 TABLET BY MOUTH EVERY DAY 05/19/22   Lelon Perla, MD  dextrose 5 % SOLN 50 mL with methotrexate 25 MG/ML (PF) SOLN 40 mg/m2 Inject 40 mg/m2 into the vein once a week. Every wednesday    [provider]  dofetilide (TIKOSYN) 125 MCG capsule TAKE 3 CAPSULES BY MOUTH TWICE A DAY 11/15/22   Lelon Perla, MD  doxazosin (CARDURA) 2 MG tablet TAKE 1 TABLET BY MOUTH EVERY DAY 05/19/22   Lelon Perla, MD  escitalopram (LEXAPRO) 10 MG tablet Take 1 tablet (10 mg total) by mouth daily. 08/15/22   [name], MD  esomeprazole (NEXIUM) 40 MG capsule Take 1 capsule (40 mg total) by mouth daily. Take in the morning before breakfast. 01/15/18   Esterwood, Amy S, PA-C  folic acid (FOLVITE) 1 MG tablet Take 2 mg by mouth daily. 09/25/19   [provider]  inFLIXimab 10 mg/kg in sodium chloride 0.9 % Inject 10 mg/kg into the vein every  6 (six) weeks.    [provider]  loperamide (IMODIUM A-D) 2 MG tablet Take 1 mg by mouth as needed. Dihrrhea    [provider]  methylPREDNISolone (MEDROL DOSEPAK) 4 MG TBPK tablet Take 6 tablets (60 mg total) by mouth on day 1, then 5 tablets (50 mg total) on day 2, then 4 tablets (40 mg total) on day 3, then 3 tablets (30 mg total) on day 4, then 2 tablets (20 mg total) on day 5, and then 1 tablet (10 mg total) on day 6. 01/25/23   Hennie Duos, MD  metoprolol tartrate (LOPRESSOR) 25 MG tablet TAKE 1 TABLET (25 MG TOTAL) BY MOUTH IN THE MORNING AND AT BEDTIME. 11/03/22   Lelon Perla, MD  Multiple Vitamin (MULTIVITAMIN) capsule Take 1 capsule by mouth daily. 1 daily    [provider]  nitroGLYCERIN (NITROSTAT) 0.4 MG SL tablet Place 1 tablet (0.4 mg total) under the tongue every 5 (five) minutes as needed for chest pain. Do not exceed 3 tabs in 15 minutes 08/20/18   Lelon Perla, MD  Probiotic Product ([place]) CAPS Take 1 capsule by mouth daily. 1 per day    [provider]  TUBERCULIN SYR 1CC/27GX1/2" 27G X 1/2" 1 ML MISC     [provider]  valsartan (DIOVAN) 160 MG tablet Take 1 tablet (160 mg total) by  mouth daily. 02/21/22   Lelon Perla, MD  warfarin (COUMADIN) 2.5 MG tablet TAKE 1/2 TABLET TO 1 TABLET BY MOUTH DAILY AS DIRECTED BY COUMADIN CLINIC 12/16/22   Lelon Perla, MD      Allergies    Hydralazine, Cefdinir, Codeine, Fosamax [alendronate sodium], Guaifenesin er, Latex, Percocet [oxycodone-acetaminophen], and Psyllium    Review of Systems   Review of Systems  Constitutional:  Positive for chills. Negative for diaphoresis, fatigue and fever.  HENT:  Negative for congestion.   Eyes:  Negative for visual disturbance.  Respiratory:  Positive for shortness of breath. Negative for cough, chest tightness and wheezing.   Cardiovascular:  Positive for chest pain and palpitations. Negative for leg swelling,  syncope and near-syncope.  Gastrointestinal:  Negative for abdominal pain, constipation, diarrhea and nausea.  Genitourinary:  Negative for flank pain.  Musculoskeletal:  Negative for back pain.  Skin:  Negative for rash and wound.  Neurological:  Negative for light-headedness and headaches.  Psychiatric/Behavioral:  Negative for agitation.   All other systems reviewed and are negative.   Physical Exam Updated Vital Signs BP (!) 196/74 (BP Location: Right Arm)   Pulse (!) 108   Temp 97.6 F (36.4 C) (Oral)   Resp 20   Ht 5\' 3"  (1.6 m)   Wt 113.4 kg   SpO2 97%   BMI 44.29 kg/m  Physical Exam Vitals and nursing note reviewed.  Constitutional:      General: She is not in acute distress.    Appearance: She is well-developed. She is not ill-appearing, toxic-appearing or diaphoretic.  HENT:     Head: Normocephalic and atraumatic.     Right Ear: External ear normal.     Left Ear: External ear normal.     Nose: Nose normal.     Mouth/Throat:     Pharynx: No oropharyngeal exudate.  Eyes:     Conjunctiva/sclera: Conjunctivae normal.     Pupils: Pupils are equal, round, and reactive to light.  Cardiovascular:     Rate and Rhythm: Tachycardia present. Rhythm irregular.     Heart sounds: Normal heart sounds.  Pulmonary:     Effort: Pulmonary effort is normal. No respiratory distress.     Breath sounds: No stridor. No decreased breath sounds, wheezing, rhonchi or rales.  Chest:     Chest wall: No tenderness.  Abdominal:     General: There is no distension.     Tenderness: There is no abdominal tenderness. There is no rebound.  Musculoskeletal:     Cervical back: Normal range of motion and neck supple.     Right lower leg: No edema.     Left lower leg: No edema.  Skin:    General: Skin is warm.     Findings: No erythema or rash.  Neurological:     Mental Status: She is alert and oriented to person, place, and time.     Motor: No abnormal muscle tone.     Coordination:  Coordination normal.     Deep Tendon Reflexes: Reflexes are normal and symmetric.     ED Results / Procedures / Treatments   Labs (all labs ordered are listed, but only abnormal results are displayed) Labs Reviewed  BASIC METABOLIC PANEL - Abnormal; Notable for the following components:      Result Value   CO2 21 (*)    Glucose, Bld 159 (*)    Creatinine, Ser 1.24 (*)    GFR, Estimated 45 (*)  All other components within normal limits  CBC - Abnormal; Notable for the following components:   WBC 11.9 (*)    All other components within normal limits  PROTIME-INR - Abnormal; Notable for the following components:   Prothrombin Time 29.3 (*)    INR 2.8 (*)    All other components within normal limits  TROPONIN I (HIGH SENSITIVITY) - Abnormal; Notable for the following components:   Troponin I (High Sensitivity) 105 (*)    All other components within normal limits    EKG None  Radiology DG Chest 2 View  Result Date: 01/26/2023 CLINICAL DATA:  Chest pain. EXAM: CHEST - 2 VIEW COMPARISON:  Chest radiograph dated 05/25/2012. FINDINGS: Minimal bibasilar atelectasis. No focal consolidation, pleural effusion, or pneumothorax. The cardiac silhouette is within normal limits. No acute osseous pathology. IMPRESSION: No active cardiopulmonary disease. Electronically Signed   By: Anner Crete M.D.   On: 01/26/2023 21:35    Procedures .Cardioversion  Date/Time: 01/26/2023 11:57 PM  Performed by: Courtney Paris, MD Authorized by: Courtney Paris, MD   Consent:    Consent obtained:  Written   Risks discussed:  Cutaneous burn, induced arrhythmia, death and pain   Alternatives discussed:  No treatment Pre-procedure details:    Cardioversion basis:  Emergent   Rhythm:  Atrial fibrillation   Electrode placement:  Anterior-posterior Patient sedated: Yes. Refer to sedation procedure documentation for details of sedation.  Attempt one:    Cardioversion mode:  Synchronous    Waveform:  Biphasic   Shock (Joules):  200   Shock outcome:  Conversion to normal sinus rhythm Post-procedure details:    Patient status:  Awake   Patient tolerance of procedure:  Tolerated well, no immediate complications .Sedation  Date/Time: 01/26/2023 11:59 PM  Performed by: Courtney Paris, MD Authorized by: Courtney Paris, MD   Consent:    Consent obtained:  Written   Consent given by:  Patient   Risks discussed:  Dysrhythmia, prolonged hypoxia resulting in organ damage, inadequate sedation, prolonged sedation necessitating reversal, respiratory compromise necessitating ventilatory assistance and intubation, vomiting and nausea Universal protocol:    Immediately prior to procedure, a time out was called: yes   Indications:    Procedure performed:  Cardioversion   Procedure necessitating sedation performed by:  Physician performing sedation Pre-sedation assessment:    Time since last food or drink:  2 hours   NPO status caution: urgency dictates proceeding with non-ideal NPO status     ASA classification: class 2 - patient with mild systemic disease     Mallampati score:  I - soft palate, uvula, fauces, pillars visible   Neck mobility: normal     Pre-sedation assessments completed and reviewed: pre-procedure airway patency not reviewed, pre-procedure mental status not reviewed, pre-procedure nausea and vomiting status not reviewed and pre-procedure respiratory function not reviewed   Immediate pre-procedure details:    Reassessment: Patient reassessed immediately prior to procedure   Procedure details (see MAR for exact dosages):    Preoxygenation:  Nasal cannula   Sedation:  Etomidate   Intended level of sedation: deep   Analgesia:  None   Intra-procedure monitoring:  Continuous capnometry, blood pressure monitoring, frequent LOC assessments, continuous pulse oximetry, cardiac monitor and frequent vital sign checks   Intra-procedure events: none      Intra-procedure management:  Airway repositioning   Total Provider sedation time (minutes):  25 Post-procedure details:    Patient is stable for discharge or admission: yes  Procedure completion:  Tolerated well, no immediate complications     CRITICAL CARE Performed by: Gwenyth Allegra Gordie Belvin Total critical care time: 30 minutes Critical care time was exclusive of separately billable procedures and treating other patients. Critical care was necessary to treat or prevent imminent or life-threatening deterioration. Critical care was time spent personally by me on the following activities: development of treatment plan with patient and/or surrogate as well as nursing, discussions with consultants, evaluation of patient's response to treatment, examination of patient, obtaining history from patient or surrogate, ordering and performing treatments and interventions, ordering and review of laboratory studies, ordering and review of radiographic studies, pulse oximetry and re-evaluation of patient's condition.  Medications Ordered in ED Medications  etomidate (AMIDATE) injection 10 mg (10 mg Intravenous Not Given 01/26/23 2253)  etomidate (AMIDATE) injection (10 mg Intravenous Given 01/26/23 2242)    ED Course/ Medical Decision Making/ A&P                             Medical Decision Making Amount and/or Complexity of Data Reviewed Labs: ordered. Radiology: ordered.  Risk Prescription drug management.    KARLEI ALLOWAY is a 77 y.o. female with past medical history significant for hypertension, hyperlipidemia, CAD with previous MI, paroxysmal atrial fibrillation on Coumadin therapy, ulcerative colitis, sleep apnea, anxiety and diverticulosis who presents for palpitations with some chest pain shortness of breath consistent with previous atrial fibrillation.  According to patient, she had finished eating something which is about 7:30 PM when she had onset of palpitations and some chest  tightness and shortness of breath.  This is similar to when she had A-fib in the past.  She reports he always knows right when it starts.  She is well-controlled with her Coumadin and otherwise had no preceding symptoms.  She denies any fevers, chills, congestion, cough, nausea, vomiting, constipation, diarrhea, or urinary changes.  She has been cardioverted in the past and is otherwise having no symptoms.  On exam, lungs clear.  Chest nontender.  Abdomen nontender.  Good pulses in extremities.  Patient resting comfortably but is in A-fib with a rate going up to 130 at times..  EKG shows no STEMI.  Given reassuring exam, we will get labs to confirm she is therapeutic with her INR but will also get labs to look for significant electrolyte abnormalities.  Labs reassuring but troponin was elevated over 100.  I spoke to cardiology fellow who reviewed the case and does feel that she needs to be cardioverted here.  Patient is amenable this.  Cardiology did not feel that we should trend the troponin as the troponin elevated indicates that she is likely having some heart strain and it would like to continue go up and if she is symptom-free after cardioversion he does not want it trended.  She can continue her home medicine and follow-up with cardiology.  Patient was cardioverted with sedation without difficulty with a single shock.  She then prove stability and had complete resolution of symptoms.  She is able to ambulate well without difficulty and had no symptoms.  Of note, patient started a Medrol Dosepak yesterday for her foot and she will talk to her doctor before continuing on steroids as this may have contributed to her A-fib episode today.  Given stability and resolution of symptoms, patient discharged in stable condition.          Final Clinical Impression(s) / ED Diagnoses Final diagnoses:  Palpitations  Atrial fibrillation, unspecified type    Rx / DC Orders ED Discharge Orders      None       Clinical Impression: 1. Palpitations   2. Atrial fibrillation, unspecified type     Disposition: Discharge  Condition: Good  I have discussed the results, Dx and Tx plan with the pt(& family if present). He/she/they expressed understanding and agree(s) with the plan. Discharge instructions discussed at great length. Strict return precautions discussed and pt &/or family have verbalized understanding of the instructions. No further questions at time of discharge.    New Prescriptions   No medications on file    Follow Up: your cardiology team and afib clinic     [facility] [id number] [place]       Mycah Mcdougall, Gwenyth Allegra, MD 01/27/23 [phone]

## 2023-01-26 NOTE — ED Notes (Signed)
Pt assisted up to bathroom.  Ambulated with cane with steady gait.

## 2023-01-26 NOTE — ED Triage Notes (Signed)
Pt reports chatting with a friend and felt heart start to race and endorses CP. Pt with hx of afib; pt taking steroids currently for dactylitis and thinks that might be causing it. Pt reports it might be the food that she ate that is causing indigestion. Pain radiates to both arms; denies SOB, N/V.

## 2023-01-26 NOTE — Sedation Documentation (Signed)
1 shock delivered at 200j by Stephens November, PA Student/Dr. Tegeler

## 2023-01-26 NOTE — Discharge Instructions (Signed)
Your history, exam, and evaluation are consistent with recurrent atrial fibrillation leading to the palpitations and symptoms.  Your labs were overall reassuring and after speak with cardiology, they feel you are a candidate for ED cardioversion.  We are able to cardiovert you without difficulty and you return to sinus rhythm.  You proved stability without return to arrhythmia and feel you are safe for discharge home.  I suspect your symptoms may have been brought on by the steroid so please talk to your doctor tomorrow before restarting them.  Please rest and stay hydrated and take your home medications.  If any symptoms change or worsen, return to the nearest emergency department.  Please follow-up with your cardiology team and A-fib clinic teams.

## 2023-01-26 NOTE — ED Notes (Signed)
Pt given sprite and gluten free chips for PO challenge per MD request.  Pt A&Ox4, maintaining secretions, chest rise even and unlabored, and sitting up in bed without complaints.

## 2023-01-26 NOTE — ED Notes (Signed)
Patient transported to X-ray 

## 2023-01-27 ENCOUNTER — Telehealth (HOSPITAL_COMMUNITY): Payer: Self-pay | Admitting: *Deleted

## 2023-01-27 NOTE — Telephone Encounter (Signed)
-----   Message from Alcide Clever sent at 01/27/2023 10:03 AM EDT ----- Regarding: RE: er fu Just spoke to her and she said she went to the ER last night and they did a cardioversion, so she feels better and don't think she need an appt with [place]. ----- Message ----- From: Shona Simpson, RN Sent: 01/27/2023   8:59 AM EDT To: Alcide Clever Subject: er fu                                          Pt needs ER follow up 1 week thanks [name]

## 2023-02-03 ENCOUNTER — Other Ambulatory Visit: Payer: Self-pay | Admitting: Cardiology

## 2023-02-03 DIAGNOSIS — I480 Paroxysmal atrial fibrillation: Secondary | ICD-10-CM

## 2023-02-07 ENCOUNTER — Ambulatory Visit (INDEPENDENT_AMBULATORY_CARE_PROVIDER_SITE_OTHER): Payer: Medicare Other | Admitting: Podiatry

## 2023-02-07 ENCOUNTER — Other Ambulatory Visit: Payer: Self-pay | Admitting: Family Medicine

## 2023-02-07 DIAGNOSIS — M79674 Pain in right toe(s): Secondary | ICD-10-CM | POA: Diagnosis not present

## 2023-02-07 DIAGNOSIS — I739 Peripheral vascular disease, unspecified: Secondary | ICD-10-CM

## 2023-02-07 DIAGNOSIS — Z7901 Long term (current) use of anticoagulants: Secondary | ICD-10-CM

## 2023-02-07 DIAGNOSIS — M79675 Pain in left toe(s): Secondary | ICD-10-CM | POA: Diagnosis not present

## 2023-02-07 DIAGNOSIS — B351 Tinea unguium: Secondary | ICD-10-CM

## 2023-02-07 NOTE — Progress Notes (Signed)
  Subjective:  Patient ID: [name], female    DOB: Jan 12, 1946,  MRN: [id number]  Chief Complaint  Patient presents with   Nail Problem    Routine foot care, nail trim     77 y.o. female presents with the above complaint. History confirmed with patient. Patient presenting with pain related to dystrophic thickened elongated nails. Patient is unable to trim own nails related to nail dystrophy and/or mobility issues. Patient does not have a history of T2DM but does have history PVD and has a clotting disorder.   Objective:  Physical Exam: warm, good capillary refill, DP and PT pulses 2/4 bilateral nail exam onychomycosis of the toenails, onycholysis, and dystrophic nails DP pulses palpable, PT pulses palpable, and protective sensation intact Left Foot:  Pain with palpation of nails due to elongation and dystrophic growth.  Right Foot: Pain with palpation of nails due to elongation and dystrophic growth.   Assessment:   1. Pain due to onychomycosis of toenails of both feet   2. PVD (peripheral vascular disease)   3. Current use of anticoagulant therapy        Plan:  Patient was evaluated and treated and all questions answered.  #Onychomycosis with pain  -Nails palliatively debrided as below. -Educated on self-care  Procedure: Nail Debridement Rationale: Pain Type of Debridement: manual, sharp debridement. Instrumentation: Nail nipper, rotary burr. Number of Nails: 10  Return in about 3 months (around 05/09/2023) for RFC.         Corinna Gab, DPM [facility] / [facility]

## 2023-02-16 ENCOUNTER — Other Ambulatory Visit: Payer: Self-pay | Admitting: Cardiology

## 2023-02-26 ENCOUNTER — Other Ambulatory Visit: Payer: Self-pay | Admitting: Cardiology

## 2023-02-26 DIAGNOSIS — I4891 Unspecified atrial fibrillation: Secondary | ICD-10-CM

## 2023-02-28 ENCOUNTER — Ambulatory Visit: Payer: Medicare Other | Attending: Cardiology

## 2023-02-28 DIAGNOSIS — I4891 Unspecified atrial fibrillation: Secondary | ICD-10-CM | POA: Insufficient documentation

## 2023-02-28 DIAGNOSIS — Z5181 Encounter for therapeutic drug level monitoring: Secondary | ICD-10-CM | POA: Diagnosis not present

## 2023-02-28 LAB — POCT INR: INR: 2.1 (ref 2.0–3.0)

## 2023-02-28 NOTE — Patient Instructions (Signed)
Description   Continue taking 2.5mg daily EXCEPT 1.25mg on Sundays, Tuesdays, and Thursdays.  Stay consistent with green each week  (2 per week)  Repeat INR in 6 weeks.  [facility] #[phone]      

## 2023-03-06 DIAGNOSIS — M0589 Other rheumatoid arthritis with rheumatoid factor of multiple sites: Secondary | ICD-10-CM | POA: Diagnosis not present

## 2023-04-11 ENCOUNTER — Ambulatory Visit: Payer: Medicare Other | Attending: Cardiology

## 2023-04-11 DIAGNOSIS — I4891 Unspecified atrial fibrillation: Secondary | ICD-10-CM | POA: Diagnosis not present

## 2023-04-11 DIAGNOSIS — Z5181 Encounter for therapeutic drug level monitoring: Secondary | ICD-10-CM | POA: Diagnosis not present

## 2023-04-11 LAB — POCT INR: INR: 2.8 (ref 2.0–3.0)

## 2023-04-11 NOTE — Patient Instructions (Signed)
Description   Continue taking 2.5mg daily EXCEPT 1.25mg on Sundays, Tuesdays, and Thursdays.  Stay consistent with green each week  (2 per week)  Repeat INR in 6 weeks.  [facility] #[phone]      

## 2023-04-13 DIAGNOSIS — H35342 Macular cyst, hole, or pseudohole, left eye: Secondary | ICD-10-CM | POA: Diagnosis not present

## 2023-04-13 DIAGNOSIS — Z961 Presence of intraocular lens: Secondary | ICD-10-CM | POA: Diagnosis not present

## 2023-04-13 DIAGNOSIS — H35363 Drusen (degenerative) of macula, bilateral: Secondary | ICD-10-CM | POA: Diagnosis not present

## 2023-04-17 DIAGNOSIS — Z79899 Other long term (current) drug therapy: Secondary | ICD-10-CM | POA: Diagnosis not present

## 2023-04-17 DIAGNOSIS — M0589 Other rheumatoid arthritis with rheumatoid factor of multiple sites: Secondary | ICD-10-CM | POA: Diagnosis not present

## 2023-04-24 NOTE — Assessment & Plan Note (Signed)
Does not tolerate statins.

## 2023-04-24 NOTE — Assessment & Plan Note (Signed)
Supplement and monitor 

## 2023-04-24 NOTE — Assessment & Plan Note (Signed)
hgba1c acceptable, minimize simple carbs. Increase exercise as tolerated.  

## 2023-04-24 NOTE — Assessment & Plan Note (Signed)
Following with [facility], Dr Dierdre Forth

## 2023-04-24 NOTE — Progress Notes (Unsigned)
Subjective:    Patient ID: [name], female    DOB: [birth year], 77 y.o.   MRN: [id number]  No chief complaint on file.   HPI Discussed the use of AI scribe software for clinical note transcription with the patient, who gave verbal consent to proceed.  History of Present Illness        Patient is a 77 yo female in today for follow up on chronic medical concerns. No recent febrile illness or hospitalizations. Denies CP/palp/SOB/HA/congestion/fevers/GI or GU c/o. Taking meds as prescribed     Past Medical History:  Diagnosis Date  . Anxiety   . Arthritis   . Atrial fibrillation (HCC) 2008  . CAD (coronary artery disease)   . Crohn's colitis (HCC)    she reports ulcerative colitis beginning in her 67's, biopsies 2008 suggest Crohn's not UC  . Depression   . Diverticulosis   . GERD (gastroesophageal reflux disease)   . Hyperlipidemia   . Hypertension   . IBS (irritable bowel syndrome)   . Keloid of skin   . Obesity   . OSA (obstructive sleep apnea)   . Pancreatitis   . Paroxysmal atrial fibrillation (HCC)   . Pseudoaneurysm of right femoral artery (HCC)   . Renal oncocytoma    s/p right nephrectomy  . Rheumatoid arthritis (HCC) 05/29/2017  . UC (ulcerative colitis) [facility])     Past Surgical History:  Procedure Laterality Date  . ANGIOPLASTY    . BREAST BIOPSY  10/24/1994   benign lesion   . Cataract surgery Left 11/13/2014  . CHOLECYSTECTOMY  10/24/2006  . COLONOSCOPY W/ BIOPSIES    . CORONARY ANGIOPLASTY    . ESOPHAGOGASTRODUODENOSCOPY    . NEPHRECTOMY  10/24/2006   right secondary to oncocytoma   . RIGHT FEMORAL  PSEUDOANEURYSM & RIGHT GROIN HEMATOMA EVACUATION  02/22/2007  . RIGHT RENAL ARTERY REPAIR  10/24/1974  . TUBAL LIGATION      Family History  Problem Relation Age of Onset  . Hypertension Mother   . Coronary artery disease Mother   . Stroke Mother   . Clotting disorder Mother   . Heart disease Mother   . Allergies Mother   . Colitis  Father   . Heart disease Father   . Clotting disorder Sister   . Stroke Brother   . Clotting disorder Brother   . Cancer Daughter   . Asthma Daughter   . Asthma Maternal Uncle   . Colon cancer Paternal Aunt   . Colon polyps Neg Hx   . Kidney disease Neg Hx   . Stomach cancer Neg Hx   . Esophageal cancer Neg Hx     Social History   Socioeconomic History  . Marital status: Married    Spouse name: Not on file  . Number of children: 3  . Years of education: Not on file  . Highest education level: 12th grade  Occupational History  . Occupation: Retired    Associate Professor: RETIRED    Comment: retired  Tobacco Use  . Smoking status: Never  . Smokeless tobacco: Never  Vaping Use  . Vaping Use: Never used  Substance and Sexual Activity  . Alcohol use: No  . Drug use: No  . Sexual activity: Never  Other Topics Concern  . 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 Difficulty of Paying Living Expenses: Not very hard  Food Insecurity: No Food Insecurity (04/24/2023)   Hunger Vital Sign   . Worried About Programme researcher, broadcasting/film/video in the Last Year: Never true   . Ran Out of Food in the Last Year: Never true  Transportation Needs: No Transportation Needs (04/24/2023)   PRAPARE - Transportation   . Lack of Transportation (Medical): No   . Lack of Transportation (Non-Medical): No  Physical Activity: Unknown (04/24/2023)   Exercise Vital Sign   . Days of Exercise per Week: Patient declined   . Minutes of Exercise per Session: Not on file  Stress: No Stress Concern Present (04/24/2023)   [employer] of Occupational Health - Occupational Stress Questionnaire   . Feeling of Stress : Not at all  Social Connections:  Unknown (04/24/2023)   Social Connection and Isolation Panel [NHANES]   . Frequency of Communication with Friends and Family: More than three times a week   . Frequency of Social Gatherings with Friends and Family: More than three times a week   . Attends Religious Services: Patient declined   . Active Member of Clubs or Organizations: Not on file   . Attends Banker Meetings: Not on file   . Marital Status: Married  Catering manager Violence: Not At Risk (05/24/2021)   Humiliation, Afraid, Rape, and Kick questionnaire   . Fear of Current or Ex-Partner: No   . Emotionally Abused: No   . Physically Abused: No   . Sexually Abused: No    Outpatient Medications Prior to Visit  Medication Sig Dispense Refill  . amLODipine (NORVASC) 10 MG tablet TAKE 1 TABLET BY MOUTH EVERY DAY 90 tablet 2  . dextrose 5 % SOLN 50 mL with methotrexate 25 MG/ML (PF) SOLN 40 mg/m2 Inject 40 mg/m2 into the vein once a week. Every wednesday    . dofetilide (TIKOSYN) 125 MCG capsule TAKE 3 CAPSULES BY MOUTH TWICE A DAY 540 capsule 1  . doxazosin (CARDURA) 2 MG tablet TAKE 1 TABLET BY MOUTH EVERY DAY 90 tablet 2  . escitalopram (LEXAPRO) 10 MG tablet Take 1 tablet (10 mg total) by mouth daily. 90 tablet 1  . esomeprazole (NEXIUM) 40 MG capsule Take 1 capsule (40 mg total) by mouth daily. Take in the morning before breakfast. 30 capsule 11  . folic acid (FOLVITE) 1 MG tablet Take 2 mg by mouth daily.    Marland Kitchen inFLIXimab 10 mg/kg in sodium chloride 0.9 % Inject 10 mg/kg into the vein every 6 (six) weeks.    Marland Kitchen loperamide (IMODIUM A-D) 2 MG tablet Take 1 mg by mouth as needed. Dihrrhea    . metoprolol tartrate (LOPRESSOR) 25 MG tablet TAKE 1 TABLET (25 MG) BY MOUTH IN THE MORNING AND AT BEDTIME 180 tablet 3  . Multiple Vitamin (MULTIVITAMIN) capsule Take 1 capsule by mouth daily. 1 daily    . nitroGLYCERIN (NITROSTAT) 0.4 MG SL tablet Place 1 tablet (0.4 mg total) under the tongue every 5 (five) minutes as needed  for chest pain. Do not exceed 3 tabs in 15 minutes 75 tablet 3  . Probiotic Product (PHILLIPS COLON HEALTH) CAPS Take 1 capsule by mouth daily. 1 per day    . TUBERCULIN SYR 1CC/27GX1/2" 27G X 1/2" 1 ML MISC     . valsartan (DIOVAN) 160 MG tablet Take 1 tablet (160 mg total) by mouth daily.  90 tablet 3  . warfarin (COUMADIN) 2.5 MG tablet TAKE 1/2 TABLET TO 1 TABLET BY MOUTH DAILY AS DIRECTED BY COUMADIN CLINIC 75 tablet 1  . methylPREDNISolone (MEDROL DOSEPAK) 4 MG TBPK tablet Take 6 tablets (60 mg total) by mouth on day 1, then 5 tablets (50 mg total) on day 2, then 4 tablets (40 mg total) on day 3, then 3 tablets (30 mg total) on day 4, then 2 tablets (20 mg total) on day 5, and then 1 tablet (10 mg total) on day 6. 21 tablet 0   No facility-administered medications prior to visit.    Allergies  Allergen Reactions  . Hydralazine Diarrhea and Nausea Only    Anorexia, upset stomach, burning pain in abdomin  . Cefdinir     Pt cannot take; may interfere with AFib.  . Codeine     Heart beats fast   . Fosamax [Alendronate Sodium] Other (See Comments)    GI upset  . Guaifenesin Er Nausea And Vomiting and Other (See Comments)    Headaches; can tolerate liquid.  . Latex Other (See Comments)    Breathing problems  . Percocet [Oxycodone-Acetaminophen]     Itching & swelling in jaw  . Psyllium Other (See Comments)    Review of Systems  Constitutional:  Negative for fever and malaise/fatigue.  HENT:  Negative for congestion.   Eyes:  Negative for blurred vision.  Respiratory:  Negative for shortness of breath.   Cardiovascular:  Positive for palpitations. Negative for chest pain and leg swelling.  Gastrointestinal:  Negative for abdominal pain, blood in stool and nausea.  Genitourinary:  Negative for dysuria and frequency.  Musculoskeletal:  Negative for falls.  Skin:  Negative for rash.  Neurological:  Negative for dizziness, loss of consciousness and headaches.  Endo/Heme/Allergies:   Negative for environmental allergies.  Psychiatric/Behavioral:  Negative for depression. The patient is not nervous/anxious.       Objective:    Physical Exam Constitutional:      General: She is not in acute distress.    Appearance: Normal appearance. She is well-developed. She is not toxic-appearing.  HENT:     Head: Normocephalic and atraumatic.     Right Ear: External ear normal.     Left Ear: External ear normal.     Nose: Nose normal.  Eyes:     General:        Right eye: No discharge.        Left eye: No discharge.     Conjunctiva/sclera: Conjunctivae normal.  Neck:     Thyroid: No thyromegaly.  Cardiovascular:     Rate and Rhythm: Normal rate and regular rhythm.     Heart sounds: Normal heart sounds. No murmur heard. Pulmonary:     Effort: Pulmonary effort is normal. No respiratory distress.     Breath sounds: Normal breath sounds.  Abdominal:     General: Bowel sounds are normal.     Palpations: Abdomen is soft.     Tenderness: There is no abdominal tenderness. There is no guarding.  Musculoskeletal:        General: Normal range of motion.     Cervical back: Neck supple.  Lymphadenopathy:     Cervical: No cervical adenopathy.  Skin:    General: Skin is warm and dry.  Neurological:     Mental Status: She is alert and oriented to person, place, and time.  Psychiatric:        Mood and Affect: Mood normal.  Behavior: Behavior normal.        Thought Content: Thought content normal.        Judgment: Judgment normal.   There were no vitals taken for this visit. Wt Readings from Last 3 Encounters:  01/26/23 250 lb (113.4 kg)  12/28/22 255 lb 6.4 oz (115.8 kg)  08/15/22 251 lb (113.9 kg)    Diabetic Foot Exam - Simple   No data filed    Lab Results  Component Value Date   WBC 11.9 (H) 01/26/2023   HGB 12.9 01/26/2023   HCT 40.1 01/26/2023   PLT 211 01/26/2023   GLUCOSE 159 (H) 01/26/2023   CHOL 158 08/15/2022   TRIG 120.0 08/15/2022   HDL 51.00  08/15/2022   LDLCALC 83 08/15/2022   ALT 14 08/15/2022   AST 18 08/15/2022   NA 135 01/26/2023   K 4.0 01/26/2023   CL 103 01/26/2023   CREATININE 1.24 (H) 01/26/2023   BUN 18 01/26/2023   CO2 21 (L) 01/26/2023   TSH 2.58 08/15/2022   INR 2.8 04/11/2023   HGBA1C 5.7 08/15/2022    Lab Results  Component Value Date   TSH 2.58 08/15/2022   Lab Results  Component Value Date   WBC 11.9 (H) 01/26/2023   HGB 12.9 01/26/2023   HCT 40.1 01/26/2023   MCV 98.3 01/26/2023   PLT 211 01/26/2023   Lab Results  Component Value Date   NA 135 01/26/2023   K 4.0 01/26/2023   CO2 21 (L) 01/26/2023   GLUCOSE 159 (H) 01/26/2023   BUN 18 01/26/2023   CREATININE 1.24 (H) 01/26/2023   BILITOT 0.5 08/15/2022   ALKPHOS 86 08/15/2022   AST 18 08/15/2022   ALT 14 08/15/2022   PROT 7.4 08/15/2022   ALBUMIN 3.9 08/15/2022   CALCIUM 9.1 01/26/2023   ANIONGAP 11 01/26/2023   EGFR 52 (L) 12/28/2022   GFR 42.54 (L) 08/15/2022   Lab Results  Component Value Date   CHOL 158 08/15/2022   Lab Results  Component Value Date   HDL 51.00 08/15/2022   Lab Results  Component Value Date   LDLCALC 83 08/15/2022   Lab Results  Component Value Date   TRIG 120.0 08/15/2022   Lab Results  Component Value Date   CHOLHDL 3 08/15/2022   Lab Results  Component Value Date   HGBA1C 5.7 08/15/2022       Assessment & Plan:  Pure hypercholesterolemia Assessment & Plan: Encourage heart healthy diet such as MIND or DASH diet, increase exercise, avoid trans fats, simple carbohydrates and processed foods, consider a krill or fish or flaxseed oil cap daily.    Essential hypertension Assessment & Plan: Well controlled, no changes to meds. Encouraged heart healthy diet such as the DASH diet and exercise as tolerated.    Atrial fibrillation, unspecified type (HCC) Assessment & Plan: Rate controlled and tolerating warfarin   Vitamin D deficiency Assessment & Plan: Supplement and  monitor   Secondary hypertension Assessment & Plan: Well controlled, no changes to meds. Encouraged heart healthy diet such as the DASH diet and exercise as tolerated.     Statin intolerance Assessment & Plan: Does not tolerate statins   Hyperglycemia Assessment & Plan: hgba1c acceptable, minimize simple carbs. Increase exercise as tolerated.    Rheumatoid arthritis, involving unspecified site, unspecified whether rheumatoid factor present [facility]) Assessment & Plan: Following with [facility], Dr Dierdre Forth     Assessment and Plan  Penni Homans, MD

## 2023-04-24 NOTE — Assessment & Plan Note (Signed)
Encourage heart healthy diet such as MIND or DASH diet, increase exercise, avoid trans fats, simple carbohydrates and processed foods, consider a krill or fish or flaxseed oil cap daily.  °

## 2023-04-24 NOTE — Assessment & Plan Note (Signed)
Well controlled, no changes to meds. Encouraged heart healthy diet such as the DASH diet and exercise as tolerated.  °

## 2023-04-24 NOTE — Assessment & Plan Note (Signed)
Rate controlled and tolerating warfarin

## 2023-04-25 ENCOUNTER — Telehealth (HOSPITAL_BASED_OUTPATIENT_CLINIC_OR_DEPARTMENT_OTHER): Payer: Self-pay

## 2023-04-25 ENCOUNTER — Ambulatory Visit (INDEPENDENT_AMBULATORY_CARE_PROVIDER_SITE_OTHER): Payer: Medicare Other | Admitting: Family Medicine

## 2023-04-25 VITALS — BP 150/65 | HR 56 | Temp 97.8°F | Resp 12

## 2023-04-25 DIAGNOSIS — M545 Low back pain, unspecified: Secondary | ICD-10-CM | POA: Diagnosis not present

## 2023-04-25 DIAGNOSIS — R519 Headache, unspecified: Secondary | ICD-10-CM | POA: Diagnosis not present

## 2023-04-25 DIAGNOSIS — I10 Essential (primary) hypertension: Secondary | ICD-10-CM | POA: Diagnosis not present

## 2023-04-25 DIAGNOSIS — Z789 Other specified health status: Secondary | ICD-10-CM

## 2023-04-25 DIAGNOSIS — M069 Rheumatoid arthritis, unspecified: Secondary | ICD-10-CM | POA: Diagnosis not present

## 2023-04-25 DIAGNOSIS — R739 Hyperglycemia, unspecified: Secondary | ICD-10-CM | POA: Diagnosis not present

## 2023-04-25 DIAGNOSIS — I159 Secondary hypertension, unspecified: Secondary | ICD-10-CM

## 2023-04-25 DIAGNOSIS — M810 Age-related osteoporosis without current pathological fracture: Secondary | ICD-10-CM | POA: Diagnosis not present

## 2023-04-25 DIAGNOSIS — I4891 Unspecified atrial fibrillation: Secondary | ICD-10-CM

## 2023-04-25 DIAGNOSIS — E559 Vitamin D deficiency, unspecified: Secondary | ICD-10-CM | POA: Diagnosis not present

## 2023-04-25 DIAGNOSIS — M4690 Unspecified inflammatory spondylopathy, site unspecified: Secondary | ICD-10-CM

## 2023-04-25 DIAGNOSIS — E7800 Pure hypercholesterolemia, unspecified: Secondary | ICD-10-CM

## 2023-04-25 DIAGNOSIS — G8929 Other chronic pain: Secondary | ICD-10-CM

## 2023-04-25 LAB — COMPREHENSIVE METABOLIC PANEL
ALT: 14 U/L (ref 0–35)
AST: 19 U/L (ref 0–37)
Albumin: 4 g/dL (ref 3.5–5.2)
Alkaline Phosphatase: 83 U/L (ref 39–117)
BUN: 17 mg/dL (ref 6–23)
CO2: 26 mEq/L (ref 19–32)
Calcium: 9.7 mg/dL (ref 8.4–10.5)
Chloride: 103 mEq/L (ref 96–112)
Creatinine, Ser: 1.07 mg/dL (ref 0.40–1.20)
GFR: 50.04 mL/min — ABNORMAL LOW (ref >60.00)
Glucose, Bld: 92 mg/dL (ref 70–99)
Potassium: 4.2 mEq/L (ref 3.5–5.1)
Sodium: 139 mEq/L (ref 135–145)
Total Bilirubin: 0.6 mg/dL (ref 0.2–1.2)
Total Protein: 7.4 g/dL (ref 6.0–8.3)

## 2023-04-25 LAB — LIPID PANEL
Cholesterol: 162 mg/dL (ref 0–200)
HDL: 49.7 mg/dL (ref >39.00)
LDL Cholesterol: 88 mg/dL (ref 0–99)
NonHDL: 112.37
Total CHOL/HDL Ratio: 3
Triglycerides: 123 mg/dL (ref 0.0–149.0)
VLDL: 24.6 mg/dL (ref 0.0–40.0)

## 2023-04-25 LAB — CBC
HCT: 39.4 % (ref 36.0–46.0)
Hemoglobin: 12.6 g/dL (ref 12.0–15.0)
MCHC: 31.9 g/dL (ref 30.0–36.0)
MCV: 99 fl (ref 78.0–100.0)
Platelets: 242 10*3/uL (ref 150.0–400.0)
RBC: 3.97 Mil/uL (ref 3.87–5.11)
RDW: 16.7 % — ABNORMAL HIGH (ref 11.5–15.5)
WBC: 7.9 10*3/uL (ref 4.0–10.5)

## 2023-04-25 LAB — TSH: TSH: 4.49 u[IU]/mL (ref 0.35–5.50)

## 2023-04-25 LAB — MICROALBUMIN / CREATININE URINE RATIO
Creatinine,U: 242.2 mg/dL
Microalb Creat Ratio: 1.6 mg/g (ref 0.0–30.0)
Microalb, Ur: 3.8 mg/dL — ABNORMAL HIGH (ref 0.0–1.9)

## 2023-04-25 LAB — HEMOGLOBIN A1C: Hgb A1c MFr Bld: 5.6 % (ref 4.6–6.5)

## 2023-04-25 LAB — VITAMIN D 25 HYDROXY (VIT D DEFICIENCY, FRACTURES): VITD: 37.92 ng/mL (ref 30.00–100.00)

## 2023-04-25 NOTE — Assessment & Plan Note (Signed)
Describes left sided and can radiate to left labia and back again but is tolerable and happens infrequently. Likely arthritic changes related to old trauma to hip and low back now compromising nerves if worsens will need to consider referral for specialty care

## 2023-04-25 NOTE — Assessment & Plan Note (Signed)
Encouraged to get adequate exercise, calcium and vitamin d intake 

## 2023-04-25 NOTE — Patient Instructions (Addendum)
Consider Calcium Citrate instead of calcium carbonate, it absorbs better with less side effects   Chronic Back Pain Chronic back pain is back pain that lasts longer than 3 months. The cause of your back pain may not be known. Some common causes include: Wear and tear (degenerative disease) of the bones, disks, or tissues that connect bones to each other (ligaments) in your back. Inflammation and stiffness in your back (arthritis). If you have chronic back pain, you may have times when the pain is more intense (flare-ups). You can also learn to manage the pain with home care. Follow these instructions at home: Watch for any changes in your symptoms. Take these actions to help with your pain: Managing pain and stiffness     If told, put ice on the painful area. You may be told to apply ice for the first 24-48 hours after a flare-up starts. Put ice in a plastic bag. Place a towel between your skin and the bag. Leave the ice on for 20 minutes, 2-3 times per day. If told, apply heat to the affected area as often as told by your health care provider. Use the heat source that your provider recommends, such as a moist heat pack or a heating pad. Place a towel between your skin and the heat source. Leave the heat on for 20-30 minutes. If your skin turns bright red, remove the ice or heat right away to prevent skin damage. The risk of damage is higher if you cannot feel pain, heat, or cold. Try soaking in a warm tub. Activity        Avoid bending and other activities that make the pain worse. Have good posture when you stand or sit. When you stand, keep your upper back and neck straight, with your shoulders pulled back. Avoid slouching. When you sit, keep your back straight. Relax your shoulders. Do not round your shoulders or pull them backward. Do not sit or stand in one place for too long. Take brief periods of rest during the day. This will reduce your pain. Resting in a lying or standing  position is often better than sitting to rest. When you rest for longer periods, mix in some mild activity or stretching between periods of rest. This will help to prevent stiffness and pain. Get regular exercise. Ask your provider what activities are safe for you. You may have to avoid lifting. Ask your provider how much you can safely lift. If you do lift, always use the right technique. This means you should: Bend your knees. Keep the load close to your body. Avoid twisting. Medicines Take over-the-counter and prescription medicines only as told by your provider. You may need to take medicines for pain and inflammation. These may be taken by mouth or put on the skin. You may also be given muscle relaxants. Ask your provider if the medicine prescribed to you: Requires you to avoid driving or using machinery. Can cause constipation. You may need to take these actions to prevent or treat constipation: Drink enough fluid to keep your pee (urine) pale yellow. Take over-the-counter or prescription medicines. Eat foods that are high in fiber, such as beans, whole grains, and fresh fruits and vegetables. Limit foods that are high in fat and processed sugars, such as fried or sweet foods. General instructions  Sleep on a firm mattress in a comfortable position. Try lying on your side with your knees slightly bent. If you lie on your back, put a pillow under your knees.  Do not use any products that contain nicotine or tobacco. These products include cigarettes, chewing tobacco, and vaping devices, such as e-cigarettes. If you need help quitting, ask your provider. Contact a health care provider if: You have pain that does not get better with rest or medicine. You have new pain. You have a fever. You lose weight quickly. You have trouble doing your normal activities. You feel weak or numb in one or both of your legs or feet. Get help right away if: You are not able to control when you pee or  poop. You have severe back pain and: Nausea or vomiting. Pain in your chest or abdomen. Shortness of breath. You faint. These symptoms may be an emergency. Get help right away. Call 911. Do not wait to see if the symptoms will go away. Do not drive yourself to the hospital. This information is not intended to replace advice given to you by your health care provider. Make sure you discuss any questions you have with your health care provider. Document Revised: 05/30/2022 Document Reviewed: 05/30/2022 Elsevier Patient Education  2024 [employer].

## 2023-04-25 NOTE — Assessment & Plan Note (Signed)
Left sided with some questionable hearing loss on left. Has been getting progressively worse over a couple of months

## 2023-04-26 ENCOUNTER — Encounter: Payer: Self-pay | Admitting: Family Medicine

## 2023-05-01 ENCOUNTER — Other Ambulatory Visit: Payer: Self-pay | Admitting: Cardiology

## 2023-05-09 ENCOUNTER — Ambulatory Visit: Payer: Medicare Other | Admitting: Podiatry

## 2023-05-09 DIAGNOSIS — I739 Peripheral vascular disease, unspecified: Secondary | ICD-10-CM

## 2023-05-09 DIAGNOSIS — M79674 Pain in right toe(s): Secondary | ICD-10-CM

## 2023-05-09 DIAGNOSIS — M79675 Pain in left toe(s): Secondary | ICD-10-CM

## 2023-05-09 DIAGNOSIS — B351 Tinea unguium: Secondary | ICD-10-CM | POA: Diagnosis not present

## 2023-05-09 NOTE — Progress Notes (Signed)
  Subjective:  Patient ID: [name], female    DOB: [birth year],  MRN: [id number]  Chief Complaint  Patient presents with   Nail Problem    Routine Foot Care- nail trim     77 y.o. female presents with the above complaint. History confirmed with patient. Patient presenting with pain related to dystrophic thickened elongated nails. Patient is unable to trim own nails related to nail dystrophy and/or mobility issues. Patient does not have a history of T2DM but does have history PVD and has a clotting disorder.   Objective:  Physical Exam: warm, good capillary refill, DP and PT pulses 2/4 bilateral nail exam onychomycosis of the toenails, onycholysis, and dystrophic nails DP pulses palpable, PT pulses palpable, and protective sensation intact Left Foot:  Pain with palpation of nails due to elongation and dystrophic growth.  Right Foot: Pain with palpation of nails due to elongation and dystrophic growth.   Assessment:   1. Pain due to onychomycosis of toenails of both feet   2. PVD (peripheral vascular disease) (HCC)     Plan:  Patient was evaluated and treated and all questions answered.  #Onychomycosis with pain  -Nails palliatively debrided as below. -Educated on self-care  Procedure: Nail Debridement Rationale: Pain Type of Debridement: manual, sharp debridement. Instrumentation: Nail nipper, rotary burr. Number of Nails: 10  Return in about 3 months (around 08/09/2023) for RFC.         Corinna Gab, DPM [facility] / [facility]

## 2023-05-20 ENCOUNTER — Other Ambulatory Visit: Payer: Self-pay | Admitting: Cardiology

## 2023-05-23 ENCOUNTER — Telehealth: Payer: Self-pay | Admitting: Cardiology

## 2023-05-23 ENCOUNTER — Ambulatory Visit: Payer: Medicare Other

## 2023-05-23 ENCOUNTER — Emergency Department (HOSPITAL_BASED_OUTPATIENT_CLINIC_OR_DEPARTMENT_OTHER)
Admission: EM | Admit: 2023-05-23 | Discharge: 2023-05-23 | Disposition: A | Discharge status: Home / Self Care | Payer: Medicare Other

## 2023-05-23 ENCOUNTER — Emergency Department (HOSPITAL_BASED_OUTPATIENT_CLINIC_OR_DEPARTMENT_OTHER): Payer: Medicare Other

## 2023-05-23 ENCOUNTER — Encounter (HOSPITAL_BASED_OUTPATIENT_CLINIC_OR_DEPARTMENT_OTHER): Payer: Self-pay | Admitting: *Deleted

## 2023-05-23 DIAGNOSIS — I480 Paroxysmal atrial fibrillation: Secondary | ICD-10-CM | POA: Insufficient documentation

## 2023-05-23 DIAGNOSIS — R002 Palpitations: Secondary | ICD-10-CM | POA: Diagnosis present

## 2023-05-23 DIAGNOSIS — Z79899 Other long term (current) drug therapy: Secondary | ICD-10-CM | POA: Insufficient documentation

## 2023-05-23 DIAGNOSIS — I4891 Unspecified atrial fibrillation: Secondary | ICD-10-CM | POA: Diagnosis not present

## 2023-05-23 DIAGNOSIS — Z91040 Latex allergy status: Secondary | ICD-10-CM | POA: Diagnosis not present

## 2023-05-23 DIAGNOSIS — Z7901 Long term (current) use of anticoagulants: Secondary | ICD-10-CM | POA: Insufficient documentation

## 2023-05-23 DIAGNOSIS — I10 Essential (primary) hypertension: Secondary | ICD-10-CM | POA: Diagnosis not present

## 2023-05-23 DIAGNOSIS — I2510 Atherosclerotic heart disease of native coronary artery without angina pectoris: Secondary | ICD-10-CM | POA: Diagnosis not present

## 2023-05-23 LAB — CBC WITH DIFFERENTIAL/PLATELET
Abs Immature Granulocytes: 0.03 10*3/uL (ref 0.00–0.07)
Basophils Absolute: 0 10*3/uL (ref 0.0–0.1)
Basophils Relative: 1 %
Eosinophils Absolute: 0.1 10*3/uL (ref 0.0–0.5)
Eosinophils Relative: 2 %
HCT: 38.8 % (ref 36.0–46.0)
Hemoglobin: 12.4 g/dL (ref 12.0–15.0)
Immature Granulocytes: 0 %
Lymphocytes Relative: 26 %
Lymphs Abs: 1.8 10*3/uL (ref 0.7–4.0)
MCH: 31.3 pg (ref 26.0–34.0)
MCHC: 32 g/dL (ref 30.0–36.0)
MCV: 98 fL (ref 80.0–100.0)
Monocytes Absolute: 0.6 10*3/uL (ref 0.1–1.0)
Monocytes Relative: 9 %
Neutro Abs: 4.1 10*3/uL (ref 1.7–7.7)
Neutrophils Relative %: 62 %
Platelets: 216 10*3/uL (ref 150–400)
RBC: 3.96 MIL/uL (ref 3.87–5.11)
RDW: 15.7 % — ABNORMAL HIGH (ref 11.5–15.5)
WBC: 6.7 10*3/uL (ref 4.0–10.5)
nRBC: 0 % (ref 0.0–0.2)

## 2023-05-23 LAB — URINALYSIS, W/ REFLEX TO CULTURE (INFECTION SUSPECTED)
Bilirubin Urine: NEGATIVE
Glucose, UA: NEGATIVE mg/dL
Ketones, ur: NEGATIVE mg/dL
Leukocytes,Ua: NEGATIVE
Nitrite: NEGATIVE
Protein, ur: NEGATIVE mg/dL
Specific Gravity, Urine: 1.015 (ref 1.005–1.030)
pH: 7.5 (ref 5.0–8.0)

## 2023-05-23 LAB — BASIC METABOLIC PANEL
Anion gap: 9 (ref 5–15)
BUN: 14 mg/dL (ref 8–23)
CO2: 25 mmol/L (ref 22–32)
Calcium: 8.8 mg/dL — ABNORMAL LOW (ref 8.9–10.3)
Chloride: 106 mmol/L (ref 98–111)
Creatinine, Ser: 1.09 mg/dL — ABNORMAL HIGH (ref 0.44–1.00)
GFR, Estimated: 52 mL/min — ABNORMAL LOW (ref >60)
Glucose, Bld: 104 mg/dL — ABNORMAL HIGH (ref 70–99)
Potassium: 4.1 mmol/L (ref 3.5–5.1)
Sodium: 140 mmol/L (ref 135–145)

## 2023-05-23 LAB — TROPONIN I (HIGH SENSITIVITY)
Troponin I (High Sensitivity): 19 ng/L — ABNORMAL HIGH (ref <18)
Troponin I (High Sensitivity): 19 ng/L — ABNORMAL HIGH (ref <18)

## 2023-05-23 LAB — PROTIME-INR
INR: 1.1 (ref 0.8–1.2)
Prothrombin Time: 14.9 seconds (ref 11.4–15.2)

## 2023-05-23 LAB — MAGNESIUM: Magnesium: 2.1 mg/dL (ref 1.7–2.4)

## 2023-05-23 MED ORDER — WARFARIN SODIUM 5 MG PO TABS
5.0000 mg | ORAL_TABLET | Freq: Once | ORAL | Status: AC
  Administered 2023-05-23: 5 mg via ORAL
  Filled 2023-05-23: qty 1

## 2023-05-23 MED ORDER — METOPROLOL TARTRATE 25 MG PO TABS
25.0000 mg | ORAL_TABLET | Freq: Once | ORAL | Status: AC
  Administered 2023-05-23: 25 mg via ORAL
  Filled 2023-05-23: qty 1

## 2023-05-23 MED ORDER — SODIUM CHLORIDE 0.9 % IV BOLUS
1000.0000 mL | Freq: Once | INTRAVENOUS | Status: AC
  Administered 2023-05-23: 1000 mL via INTRAVENOUS

## 2023-05-23 NOTE — ED Provider Notes (Signed)
[facility] Provider Note   CSN: [id number] Arrival date & time: 05/23/23  1000     History  Chief Complaint  Patient presents with   Palpitations    [name] is a 77 y.o. female.  Is a 77 year old female presenting emergency department chief complaint of palpitations.  She reports symptoms started this morning with feeling that her heart was racing.  She is essentially asymptomatic otherwise, denies other lightheadedness, chest pain, shortness of breath, abdominal pain.  She does note that she has had some urinary frequency/dysuria for the past several days.  She has been compliant with her Tikosyn and metoprolol which she took this morning.  However, she does not think she took her Coumadin for the past 5 days 2/2 forgetting to place pills in weekly pill box.    Palpitations      Home Medications Prior to Admission medications   Medication Sig Start Date End Date Taking? Authorizing Provider  amLODipine (NORVASC) 10 MG tablet TAKE 1 TABLET BY MOUTH EVERY DAY 02/16/23   Lewayne Bunting, MD  dextrose 5 % SOLN 50 mL with methotrexate 25 MG/ML (PF) SOLN 40 mg/m2 Inject 40 mg/m2 into the vein once a week. Every wednesday    [provider]  dofetilide (TIKOSYN) 125 MCG capsule TAKE 3 CAPSULES BY MOUTH TWICE A DAY 05/22/23   Lewayne Bunting, MD  doxazosin (CARDURA) 2 MG tablet TAKE 1 TABLET BY MOUTH EVERY DAY 02/16/23   Lewayne Bunting, MD  escitalopram (LEXAPRO) 10 MG tablet Take 1 tablet (10 mg total) by mouth daily. 02/07/23   Bradd Canary, MD  esomeprazole (NEXIUM) 40 MG capsule Take 1 capsule (40 mg total) by mouth daily. Take in the morning before breakfast. 01/15/18   Esterwood, Amy S, PA-C  folic acid (FOLVITE) 1 MG tablet Take 2 mg by mouth daily. 09/25/19   [provider]  inFLIXimab 10 mg/kg in sodium chloride 0.9 % Inject 10 mg/kg into the vein every 6 (six) weeks.    [provider]   loperamide (IMODIUM A-D) 2 MG tablet Take 1 mg by mouth as needed. Dihrrhea    [provider]  metoprolol tartrate (LOPRESSOR) 25 MG tablet TAKE 1 TABLET (25 MG) BY MOUTH IN THE MORNING AND AT BEDTIME 02/03/23   Lewayne Bunting, MD  Multiple Vitamin (MULTIVITAMIN) capsule Take 1 capsule by mouth daily. 1 daily    [provider]  nitroGLYCERIN (NITROSTAT) 0.4 MG SL tablet Place 1 tablet (0.4 mg total) under the tongue every 5 (five) minutes as needed for chest pain. Do not exceed 3 tabs in 15 minutes 08/20/18   Lewayne Bunting, MD  Probiotic Product [facility]) CAPS Take 1 capsule by mouth daily. 1 per day    [provider]  TUBERCULIN SYR 1CC/27GX1/2" 27G X 1/2" 1 ML MISC     [provider]  valsartan (DIOVAN) 160 MG tablet TAKE 1 TABLET BY MOUTH EVERY DAY 05/01/23   Lewayne Bunting, MD  warfarin (COUMADIN) 2.5 MG tablet TAKE 1/2 TABLET TO 1 TABLET BY MOUTH DAILY AS DIRECTED BY COUMADIN CLINIC 02/27/23   Lewayne Bunting, MD      Allergies    Hydralazine, Cefdinir, Codeine, Fosamax [alendronate sodium], Guaifenesin er, Latex, Percocet [oxycodone-acetaminophen], and Psyllium    Review of Systems   Review of Systems  Cardiovascular:  Positive for palpitations.    Physical Exam Updated Vital Signs BP Marland Kitchen)  152/88   Pulse 81   Temp 98.4 F (36.9 C)   Resp 20   Wt 119.5 kg   SpO2 93%   BMI 46.68 kg/m  Physical Exam Vitals and nursing note reviewed.  Constitutional:      General: She is not in acute distress.    Appearance: She is not toxic-appearing.  HENT:     Head: Normocephalic and atraumatic.     Mouth/Throat:     Mouth: Mucous membranes are moist.  Eyes:     Conjunctiva/sclera: Conjunctivae normal.  Cardiovascular:     Rate and Rhythm: Tachycardia present. Rhythm irregular.     Comments: Patient appears to be in A-fib on the monitor on my independent interpretation., rate controlled with a heart rate 90-1  teens Pulmonary:     Effort: Pulmonary effort is normal.     Breath sounds: Normal breath sounds.  Abdominal:     General: Abdomen is flat. There is no distension.     Tenderness: There is no abdominal tenderness. There is no guarding or rebound.  Skin:    General: Skin is warm and dry.     Capillary Refill: Capillary refill takes less than 2 seconds.  Neurological:     Mental Status: She is alert and oriented to person, place, and time.  Psychiatric:        Mood and Affect: Mood normal.        Behavior: Behavior normal.     ED Results / Procedures / Treatments   Labs (all labs ordered are listed, but only abnormal results are displayed) Labs Reviewed  CBC WITH DIFFERENTIAL/PLATELET - Abnormal; Notable for the following components:      Result Value   RDW 15.7 (*)    All other components within normal limits  BASIC METABOLIC PANEL - Abnormal; Notable for the following components:   Glucose, Bld 104 (*)    Creatinine, Ser 1.09 (*)    Calcium 8.8 (*)    GFR, Estimated 52 (*)    All other components within normal limits  URINALYSIS, W/ REFLEX TO CULTURE (INFECTION SUSPECTED) - Abnormal; Notable for the following components:   Hgb urine dipstick TRACE (*)    Bacteria, UA RARE (*)    All other components within normal limits  TROPONIN I (HIGH SENSITIVITY) - Abnormal; Notable for the following components:   Troponin I (High Sensitivity) 19 (*)    All other components within normal limits  TROPONIN I (HIGH SENSITIVITY) - Abnormal; Notable for the following components:   Troponin I (High Sensitivity) 19 (*)    All other components within normal limits  PROTIME-INR  MAGNESIUM    EKG EKG Interpretation Date/Time:  Tuesday May 23 2023 10:10:29 EDT Ventricular Rate:  89 PR Interval:    QRS Duration:  97 QT Interval:  371 QTC Calculation: 452 R Axis:   66  Text Interpretation: Atrial fibrillation Paired ventricular premature complexes Confirmed by Estanislado Pandy [phone]) on  05/23/2023 10:23:23 AM  Radiology DG Chest Port 1 View  Result Date: 05/23/2023 CLINICAL DATA:  Atrial fibrillation EXAM: PORTABLE CHEST 1 VIEW COMPARISON:  01/26/2023 FINDINGS: Film is under penetrated. Normal cardiopericardial silhouette. No edema. Mild interstitial changes. No pneumothorax, effusion or consolidation. Overlapping cardiac leads. Degenerative changes of the spine. IMPRESSION: Under penetrated radiograph. Mild interstitial changes, similar to previous when adjusting for technique. Electronically Signed   By: Karen Kays M.D.   On: 05/23/2023 11:10    Procedures Procedures    Medications Ordered in ED  Medications  sodium chloride 0.9 % bolus 1,000 mL (0 mLs Intravenous Stopped 05/23/23 1217)  metoprolol tartrate (LOPRESSOR) tablet 25 mg (25 mg Oral Given 05/23/23 1158)  warfarin (COUMADIN) tablet 5 mg (5 mg Oral Given 05/23/23 1311)    ED Course/ Medical Decision Making/ A&P Clinical Course as of 05/23/23 1502  Tue May 23, 2023  1049 Per cardiology note march 2024 with Dr. Jens Som: "A/P   1 paroxysmal atrial fibrillation-patient remains in sinus rhythm today.  Will continue Tikosyn, Coumadin.  She did not tolerate DOAC's in the past.  Check potassium, renal function and magnesium.     2 hypertension-patient's blood pressure is controlled.  Continue present medications.   3 hyperlipidemia-patient is intolerant to statins and she has previously declined other lipid-lowering medications.  Continue diet.   4 coronary artery disease-she denies chest pain.  Continue medical therapy.  She is not on aspirin given need for apixaban.  Intolerant to statins.   5 carotid artery disease-mild on previous " [TY]  1054 Troponin I (High Sensitivity)(!): 19 Mild elevation [TY]  1054 INR: 1.1 Subtherapeutic on warfarin.  Consistent with patient's history of missing past 5 days [TY]  1054 Basic metabolic panel(!) No significant metabolic derangements.  Minor elevation in creatinine,  but not consistent with AKI. [TY]  1117 Workup largely reassuring.  Will consult cardiology regarding recommendations for further Tikosyn and metoprolol for patient's A-fib.  [TY]  1128 Case discussed with cardiology on-call, Dr. Peter Swaziland.  Recommends getting patient therapeutic and INR.  As long as she continues to be asymptomatic with a heart rate less than 110 and her troponin does not significantly rise she can follow-up outpatient.  Also discussed case with ED pharmacist, stated the patient could double dose her warfarin for the next 2 days and follow-up with the Coumadin clinic. [TY]  1322 Patient continues to be asymptomatic.  Heart rate in the 80s to 90s.  Repeat troponin not elevated.  Patient given dose of home warfarin.  Stable for discharge at this time. [TY]    Clinical Course User Index [TY] Coral Spikes, DO                                 Medical Decision Making Is a 77 year old female presenting emergency department for palpitations.  She is afebrile, appears to be in A-fib heart rate in the low 90s to 110s.  Hemodynamically stable.  She is essentially asymptomatic at this time.  Will get labs to look for underlying etiology.  Patient is on Coumadin, but has reportedly not taken for the past 5 days.  EKG independently turbid by myself appears to be A-fib, no ST segment changes to indicate ischemia.   Amount and/or Complexity of Data Reviewed External Data Reviewed: notes.    Details: Was cardioverted last ED evaluation for her A-fib Labs: ordered. Decision-making details documented in ED Course. Radiology: ordered. Decision-making details documented in ED Course. ECG/medicine tests:  Decision-making details documented in ED Course.  Risk Prescription drug management. Decision regarding hospitalization.         Final Clinical Impression(s) / ED Diagnoses Final diagnoses:  Paroxysmal atrial fibrillation [facility])    Rx / DC Orders ED Discharge Orders     None          Coral Spikes, DO 05/23/23 1502

## 2023-05-23 NOTE — Discharge Instructions (Addendum)
We would like you to double your home dose of warfarin tomorrow and resume your usual dose thereafter.  As discussed, please follow-up with cardiology as soon as possible.  Return immediately if develop any lightheadedness, sustained heart rate greater than 110 bpm, chest pain, shortness of breath, you pass out or develop any new or worsening symptoms that are concerning to you.  Continue to take your usual atrial fibrillation medications.

## 2023-05-23 NOTE — Telephone Encounter (Signed)
Returned pt call. Spoke with Dr. Swaziland and recommendations are to go to the ER since pt is symptomatic. Pt verbalized understanding. Pt also stated she forgot to put her Coumadin in her pill box.

## 2023-05-23 NOTE — ED Notes (Signed)
Up to [facility], steady gait. Tolerated well. Family x2 at [facility].

## 2023-05-23 NOTE — Telephone Encounter (Signed)
Patient c/o Palpitations:  High priority if patient c/o lightheadedness, shortness of breath, or chest pain  How long have you had palpitations/irregular HR/ Afib? Are you having the symptoms now?  Patient states she went into afib this morning after bowel movement around 7:00 AM and she is still in afib  Are you currently experiencing lightheadedness, SOB or CP?  No   Do you have a history of afib (atrial fibrillation) or irregular heart rhythm?  Yes   Have you checked your BP or HR? (document readings if available):  HR was 100's early this morning, but now it is in the 80's   Are you experiencing any other symptoms?  Sweats this morning

## 2023-05-23 NOTE — ED Triage Notes (Addendum)
Here for heart racing and palpitations, believes she is in afib again, thinks she has missed a week of her coumadin, denies other sx. Denies pain, sob, dizziness, fatigue, weakness, NVD, fever, cough or other sx. "Feel fairly normal". Alert, NAD, calm, interactive, resps e/u, speaking in clear complete sentences, skin W&D.

## 2023-05-30 ENCOUNTER — Ambulatory Visit: Payer: Medicare Other

## 2023-05-30 DIAGNOSIS — Z5181 Encounter for therapeutic drug level monitoring: Secondary | ICD-10-CM | POA: Diagnosis not present

## 2023-05-30 DIAGNOSIS — I4891 Unspecified atrial fibrillation: Secondary | ICD-10-CM | POA: Diagnosis not present

## 2023-05-30 LAB — POCT INR: INR: 3.6 — AB (ref 2.0–3.0)

## 2023-05-30 NOTE — Patient Instructions (Signed)
Description   HOLD today's dose and then continue taking 2.5mg  daily EXCEPT 1.25mg  on Sundays, Tuesdays, and Thursdays.  Stay consistent with green each week  (2 per week)  Repeat INR in 2 weeks.  Coumadin Clinic [phone]

## 2023-05-30 NOTE — Progress Notes (Signed)
Cardiology Office Note:  .   Date:  05/31/2023  ID:  [name], DOB 02-06-1946, MRN [id number] PCP: [name] Canary, MD  [facility] Providers Cardiologist:  [name] Millers, MD    History of Present Illness: .   [name] is a 77 y.o. 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  Cardaic catheterization in 02/2027 indicated nonobstructive plaque in the LAD.  EF at the time was 55%.  The left circumflex and right coronary artery were normal.  Per Dr. 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  In September 2012 she had a workup for pheochromocytoma that was negative. 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  January 2018 she wore a cardiac event monitor that showed sinus with PACs, PVCs and ectopic atrial tachycardia.  She has known history of atrial fibrillation, maintained in normal sinus rhythm on Tikosyn with occasional breakthrough.  In 10/2016 she was found to be in atrial fibrillation, prior to DCCV she self converted back to normal sinus rhythm, this occurred has occurred intermittently in the past few years.   Last seen by Dr. Jens [name] on 12/28/2022, at that time she had remained stable from a cardiac standpoint.   On 01/26/2023 she presented to the emergency room for chest discomfort and palpitations.  She was in atrial fibrillation.  She underwent DCCV with successful conversion to normal sinus rhythm with a single shock.  It was noted that she had started a Medrol Dosepak the day prior to her presenting in the emergency room.  She was discharged in stable condition with recommended follow-up with cardiology.  On 05/23/23 she presented to [facility] ED with palpitations. She was found to be in atrial fibrillation. She woke up that morning feeling as though her  heart was racing, she was otherwise asymptomatic. She reported compliance with her Tikosyn and metoprolol, although she did not think she had taken her Coumadin the the prior five/six days. Cardiology was consulted, recommended rate control and follow up outpatient.   Today she presents for follow up with her daughter.  Reports that she had been consistently in atrial fibrillation for the last week, she has been monitoring on her Kardia mobile.  Overall she is asymptomatic aside from increased fatigue while in atrial fibrillation.  Her [name] mobile shows that this morning she is in atrial fibrillation at a rate of 110 bpm.  She denies chest pain, palpitations, shortness of breath or lower extremity edema.  ROS: Today she denies chest pain, shortness of breath, lower extremity edema, palpitations, melena, hematuria, hemoptysis, diaphoresis, weakness, presyncope, syncope, orthopnea, and PND.   Studies Reviewed: Marland Kitchen   EKG Interpretation Date/Time:  Wednesday May 31 2023 10:00:03 EDT Ventricular Rate:  55 PR Interval:  166 QRS Duration:  82 QT Interval:  490 QTC Calculation: 468 R Axis:   50  Text Interpretation: Sinus bradycardia No acute changes Confirmed by [name] [phone]) on 05/31/2023 10:02:59 AM   Cardiac Studies & Procedures       ECHOCARDIOGRAM  ECHOCARDIOGRAM COMPLETE 09/10/2020  Narrative ECHOCARDIOGRAM REPORT    Patient Name:   IJAHNAE [name] Date of Exam: 09/10/2020 Medical Rec #:  [id number]        Height:       63.0 in Accession #:    [id number]  Weight:       252.8 lb Date of Birth:  10-30-1945       BSA:          2.136 m Patient Age:    74 years         BP:           164/68 mmHg Patient Gender: F                HR:           58 bpm. Exam Location:  [address]  Procedure: 2D Echo, 3D Echo, Cardiac Doppler, Color Doppler and Strain Analysis  Indications:    I48 Atrial fibrillation  History:        Patient has prior history of Echocardiogram  examinations, most recent 04/02/2013. Acute MI and CAD, Arrythmias:Atrial Fibrillation; Risk Factors:Hypertension, Sleep Apnea, Dyslipidemia and Obesity. Edema.  Sonographer:    [name], RDCS Referring Phys: [name]  IMPRESSIONS   1. Left ventricular ejection fraction, by estimation, is 60 to 65%. The left ventricle has normal function. The left ventricle has no regional wall motion abnormalities. Left ventricular diastolic parameters are consistent with Grade II diastolic dysfunction (pseudonormalization). The average left ventricular global longitudinal strain is -22.6 %. 2. Right ventricular systolic function is normal. The right ventricular size is normal. There is moderately elevated pulmonary artery systolic pressure. 3. Left atrial size was moderately dilated. 4. Right atrial size was mildly dilated. 5. The mitral valve is normal in structure. Trivial mitral valve regurgitation. No evidence of mitral stenosis. 6. The aortic valve is normal in structure. Aortic valve regurgitation is not visualized. No aortic stenosis is present.  Comparison(s): 04/02/13 EF 60-65%. PA pressure .  FINDINGS Left Ventricle: Left ventricular ejection fraction, by estimation, is 60 to 65%. The left ventricle has normal function. The left ventricle has no regional wall motion abnormalities. The average left ventricular global longitudinal strain is -22.6 %. The left ventricular internal cavity size was normal in size. There is no left ventricular hypertrophy. Left ventricular diastolic parameters are consistent with Grade II diastolic dysfunction (pseudonormalization).  Right Ventricle: The right ventricular size is normal. No increase in right ventricular wall thickness. Right ventricular systolic function is normal. There is moderately elevated pulmonary artery systolic pressure. The tricuspid regurgitant velocity is 3.12 m/s, and with an assumed right atrial pressure of 8 mmHg, the  estimated right ventricular systolic pressure is 46.9 mmHg.  Left Atrium: Left atrial size was moderately dilated.  Right Atrium: Right atrial size was mildly dilated.  Pericardium: There is no evidence of pericardial effusion.  Mitral Valve: The mitral valve is normal in structure. Trivial mitral valve regurgitation. No evidence of mitral valve stenosis.  Tricuspid Valve: The tricuspid valve is normal in structure. Tricuspid valve regurgitation is mild.  Aortic Valve: The aortic valve is normal in structure. Aortic valve regurgitation is not visualized. No aortic stenosis is present.  Pulmonic Valve: The pulmonic valve was normal in structure. Pulmonic valve regurgitation is not visualized.  Aorta: The aortic root and ascending aorta are structurally normal, with no evidence of dilitation.  IAS/Shunts: The atrial septum is grossly normal.   LEFT VENTRICLE PLAX 2D LVIDd:         5.00 cm  Diastology LVIDs:         3.40 cm  LV e' medial:    7.18 cm/s LV PW:         1.10 cm  LV E/e' medial:  14.1  LV IVS:        1.10 cm  LV e' lateral:   6.68 cm/s LVOT diam:     1.85 cm  LV E/e' lateral: 15.1 LV SV:         111 LV SV Index:   52       2D Longitudinal Strain LVOT Area:     2.69 cm 2D Strain GLS (A2C):   -24.1 % 2D Strain GLS (A3C):   -21.4 % 2D Strain GLS (A4C):   -22.4 % 2D Strain GLS Avg:     -22.6 %  3D Volume EF: 3D EF:        64 % LV EDV:       143 ml LV ESV:       52 ml LV SV:        91 ml  RIGHT VENTRICLE RV Basal diam:  4.20 cm RV Mid diam:    3.60 cm RV S prime:     10.40 cm/s TAPSE (M-mode): 3.0 cm RVSP:           46.9 mmHg  LEFT ATRIUM             Index       RIGHT ATRIUM           Index LA diam:        4.80 cm 2.25 cm/m  RA Pressure: 8.00 mmHg LA Vol (A2C):   78.0 ml 36.52 ml/m RA Area:     18.00 cm LA Vol (A4C):   99.3 ml 46.49 ml/m RA Volume:   47.40 ml  22.19 ml/m LA Biplane Vol: 93.2 ml 43.63 ml/m AORTIC VALVE LVOT Vmax:   141.00 cm/s LVOT  Vmean:  106.000 cm/s LVOT VTI:    0.414 m  AORTA Ao Root diam: 3.00 cm Ao Asc diam:  3.50 cm  MITRAL VALVE                TRICUSPID VALVE TR Peak grad:   38.9 mmHg TR Vmax:        312.00 cm/s MV E velocity: 101.00 cm/s  Estimated RAP:  8.00 mmHg MV A velocity: 86.50 cm/s   RVSP:           46.9 mmHg MV E/A ratio:  1.17 SHUNTS Systemic VTI:  0.41 m Systemic Diam: 1.85 cm  Kristeen Miss MD Electronically signed by Kristeen Miss MD Signature Date/Time: 09/10/2020/2:42:40 PM    Final    MONITORS  CARDIAC EVENT MONITOR 11/08/2016  Narrative Sinus with pacs, pvcs and ectopic atrial tachycardia [name]           Risk Assessment/Calculations:    CHA2DS2-VASc Score = 5   This indicates a 7.2% annual risk of stroke. The patient's score is based upon: CHF History: 0 HTN History: 1 Diabetes History: 0 Stroke History: 0 Vascular Disease History: 1 Age Score: 2 Gender Score: 1            Physical Exam:   VS:  BP 138/76   Pulse (!) 55   Ht 5\' 3"  (1.6 m)   Wt 258 lb (117 kg)   SpO2 99%   BMI 45.70 kg/m    Wt Readings from Last 3 Encounters:  05/31/23 258 lb (117 kg)  05/23/23 263 lb 8 oz (119.5 kg)  01/26/23 250 lb (113.4 kg)    GEN: Well nourished, well developed in no acute distress NECK: No JVD; No carotid bruits CARDIAC: RRR, no murmurs, rubs, gallops RESPIRATORY:  Clear to auscultation without rales, wheezing or rhonchi  ABDOMEN: Soft, non-tender, non-distended EXTREMITIES:  No edema; No deformity   ASSESSMENT AND PLAN: .    Atrial fibrillation: History of paroxysmal atrial fibrillation, previously well maintained with Tikosyn and metoprolol, history of occasional breakthrough. Presented to the ED in 01/2023 in atrial fibrillation, required cardioversion that was successful. Seen again in the ED on 05/23/23 in atrial fibrillation, was rate controlled and it recommended outpatient follow up. She had follow-up with the Coumadin clinic yesterday INR was  3.6. Today she reports that she had been in atrial fibrillation for the last week.  EKG today at visit indicates sinus bradycardia at 55 bpm.  She is generally cardiac unaware, although notes some increased fatigue when in atrial fibrillation.  Reviewed ED precautions. She has previously been followed by the atrial fibrillation clinic but has requested referral to EP.  Will place referral to EP. Continue Coumadin, Tikosyn and Metoprolol tartrate.   CAD: Cardaic catheterization in 02/2027 indicated nonobstructive plaque in the LAD.  EF at the time was 55%.  The left circumflex and right coronary artery were normal.  Per Dr. Jens [name] she had a previous MI that was felt secondary to a first diagonal branch occlusion. Last echocardiogram on 08/2020 showed EF 60 to 65%, Lv with normal function and no RWMA, LV with GIIDD. There was moderately elevated pulmonary artery systolic pressure. Stable with no anginal symptoms. No indication for ischemic evaluation.  Heart healthy diet and regular cardiovascular exercise encouraged.  Reviewed ED precautions. Continue amlodipine, dofetilide, doxazosin, Lopressor, and valsartan.   HTN: Initial blood pressure today 159/73, on recheck was 138/76. She notes intermittent high blood pressures at home while going in and out of atrial fibrillation. She will monitor her blood pressures at home and notify the office if consistently above 140/80. Continue amlodipine, doxazosin, lopressor and valsartan.   HLD: Last lipid profile on 04/25/2023 indicated total cholesterol of 162 and LDL of 88. History of statin intolerance. Discussed other lipid lowering agent, she declines.   OSA: Reports good CPAP compliance.    Carotid artery disease: Last carotid duplex on 08/2020 indicated 1 to 39% ICA stenosis bilaterally. She has been asymptomatic. Repeat as clinically indicated.        Dispo: Follow up with Dr. Elberta Fortis with next available appointment and follow up with Dr. Jens [name] in 6 months.    Signed, Rip Harbour, NP

## 2023-05-31 ENCOUNTER — Encounter: Payer: Self-pay | Admitting: Nurse Practitioner

## 2023-05-31 ENCOUNTER — Ambulatory Visit: Payer: Medicare Other | Attending: Cardiology | Admitting: Cardiology

## 2023-05-31 VITALS — BP 138/76 | HR 55 | Ht 63.0 in | Wt 258.0 lb

## 2023-05-31 DIAGNOSIS — I4891 Unspecified atrial fibrillation: Secondary | ICD-10-CM | POA: Diagnosis not present

## 2023-05-31 DIAGNOSIS — D6859 Other primary thrombophilia: Secondary | ICD-10-CM | POA: Insufficient documentation

## 2023-05-31 DIAGNOSIS — I2510 Atherosclerotic heart disease of native coronary artery without angina pectoris: Secondary | ICD-10-CM | POA: Diagnosis not present

## 2023-05-31 DIAGNOSIS — G4733 Obstructive sleep apnea (adult) (pediatric): Secondary | ICD-10-CM | POA: Diagnosis not present

## 2023-05-31 DIAGNOSIS — I10 Essential (primary) hypertension: Secondary | ICD-10-CM | POA: Diagnosis not present

## 2023-05-31 NOTE — Patient Instructions (Signed)
Medication Instructions:  Your physician recommends that you continue on your current medications as directed. Please refer to the Current Medication list given to you today.  *If you need a refill on your cardiac medications before your next appointment, please call your pharmacy*   Lab Work: NONE ordered at this time of appointment    Testing/Procedures: NONE ordered at this time of appointment     Follow-Up: At [facility], you and your health needs are our priority.  As part of our continuing mission to provide you with exceptional heart care, we have created designated Provider Care Teams.  These Care Teams include your primary Cardiologist (physician) and Advanced Practice Providers (APPs -  Physician Assistants and Nurse Practitioners) who all work together to provide you with the care you need, when you need it.  We recommend signing up for the patient portal called "MyChart".  Sign up information is provided on this After Visit Summary.  MyChart is used to connect with patients for Virtual Visits (Telemedicine).  Patients are able to view lab/test results, encounter notes, upcoming appointments, etc.  Non-urgent messages can be sent to your provider as well.   To learn more about what you can do with MyChart, go to [email or web address].    Your next appointment:   6 month(s)  Provider:   Olga Millers, MD

## 2023-06-05 DIAGNOSIS — M0589 Other rheumatoid arthritis with rheumatoid factor of multiple sites: Secondary | ICD-10-CM | POA: Diagnosis not present

## 2023-06-07 ENCOUNTER — Ambulatory Visit (INDEPENDENT_AMBULATORY_CARE_PROVIDER_SITE_OTHER): Payer: Medicare Other | Admitting: *Deleted

## 2023-06-07 DIAGNOSIS — Z0000 Encounter for general adult medical examination without abnormal findings: Secondary | ICD-10-CM

## 2023-06-07 NOTE — Patient Instructions (Signed)
[name] , Thank you for taking time to come for your Medicare Wellness Visit. I appreciate your ongoing commitment to your health goals. Please review the following plan we discussed and let me know if I can assist you in the future.     This is a list of the screening recommended for you and due dates:  Health Maintenance  Topic Date Due   Complete foot exam   Never done   Zoster (Shingles) Vaccine (1 of 2) 08/21/1965   Colon Cancer Screening  09/10/2014   DTaP/Tdap/Td vaccine (2 - Tdap) 05/13/2019   COVID-19 Vaccine (3 - Moderna risk series) 02/19/2020   Flu Shot  05/25/2023   Yearly kidney health urinalysis for diabetes  04/24/2024   Yearly kidney function blood test for diabetes  05/22/2024   Medicare Annual Wellness Visit  06/06/2024   Pneumonia Vaccine  Completed   DEXA scan (bone density measurement)  Completed   Hepatitis C Screening  Completed   HPV Vaccine  Aged Out   Hemoglobin A1C  Discontinued   Eye exam for diabetics  Discontinued    Next appointment: Follow up in one year for your annual wellness visit.   Preventive Care 78 Years and Older, Female Preventive care refers to lifestyle choices and visits with your health care provider that can promote health and wellness. What does preventive care include? A yearly physical exam. This is also called an annual well check. Dental exams once or twice a year. Routine eye exams. Ask your health care provider how often you should have your eyes checked. Personal lifestyle choices, including: Daily care of your teeth and gums. Regular physical activity. Eating a healthy diet. Avoiding tobacco and drug use. Limiting alcohol use. Practicing safe sex. Taking low-dose aspirin every day. Taking vitamin and mineral supplements as recommended by your health care provider. What happens during an annual well check? The services and screenings done by your health care provider during your annual well check will depend on  your age, overall health, lifestyle risk factors, and family history of disease. Counseling  Your health care provider may ask you questions about your: Alcohol use. Tobacco use. Drug use. Emotional well-being. Home and relationship well-being. Sexual activity. Eating habits. History of falls. Memory and ability to understand (cognition). Work and work Astronomer. Reproductive health. Screening  You may have the following tests or measurements: Height, weight, and BMI. Blood pressure. Lipid and cholesterol levels. These may be checked every 5 years, or more frequently if you are over 78 years old. Skin check. Lung cancer screening. You may have this screening every year starting at age 78 if you have a 30-pack-year history of smoking and currently smoke or have quit within the past 15 years. Fecal occult blood test (FOBT) of the stool. You may have this test every year starting at age 78. Flexible sigmoidoscopy or colonoscopy. You may have a sigmoidoscopy every 5 years or a colonoscopy every 10 years starting at age 78. Hepatitis C blood test. Hepatitis B blood test. Sexually transmitted disease (STD) testing. Diabetes screening. This is done by checking your blood sugar (glucose) after you have not eaten for a while (fasting). You may have this done every 1-3 years. Bone density scan. This is done to screen for osteoporosis. You may have this done starting at age 78. Mammogram. This may be done every 1-2 years. Talk to your health care provider about how often you should have regular mammograms. Talk with your health care provider about  your test results, treatment options, and if necessary, the need for more tests. Vaccines  Your health care provider may recommend certain vaccines, such as: Influenza vaccine. This is recommended every year. Tetanus, diphtheria, and acellular pertussis (Tdap, Td) vaccine. You may need a Td booster every 10 years. Zoster vaccine. You may need this  after age 15. Pneumococcal 13-valent conjugate (PCV13) vaccine. One dose is recommended after age 78. Pneumococcal polysaccharide (PPSV23) vaccine. One dose is recommended after age 78. Talk to your health care provider about which screenings and vaccines you need and how often you need them. This information is not intended to replace advice given to you by your health care provider. Make sure you discuss any questions you have with your health care provider. Document Released: 11/06/2015 Document Revised: 06/29/2016 Document Reviewed: 08/11/2015 Elsevier Interactive Patient Education  2017 [employer].  Fall Prevention in the Home Falls can cause injuries. They can happen to people of all ages. There are many things you can do to make your home safe and to help prevent falls. What can I do on the outside of my home? Regularly fix the edges of walkways and driveways and fix any cracks. Remove anything that might make you trip as you walk through a door, such as a raised step or threshold. Trim any bushes or trees on the path to your home. Use bright outdoor lighting. Clear any walking paths of anything that might make someone trip, such as rocks or tools. Regularly check to see if handrails are loose or broken. Make sure that both sides of any steps have handrails. Any raised decks and porches should have guardrails on the edges. Have any leaves, snow, or ice cleared regularly. Use sand or salt on walking paths during winter. Clean up any spills in your garage right away. This includes oil or grease spills. What can I do in the bathroom? Use night lights. Install grab bars by the toilet and in the tub and shower. Do not use towel bars as grab bars. Use non-skid mats or decals in the tub or shower. If you need to sit down in the shower, use a plastic, non-slip stool. Keep the floor dry. Clean up any water that spills on the floor as soon as it happens. Remove soap buildup in the tub or  shower regularly. Attach bath mats securely with double-sided non-slip rug tape. Do not have throw rugs and other things on the floor that can make you trip. What can I do in the bedroom? Use night lights. Make sure that you have a light by your bed that is easy to reach. Do not use any sheets or blankets that are too big for your bed. They should not hang down onto the floor. Have a firm chair that has side arms. You can use this for support while you get dressed. Do not have throw rugs and other things on the floor that can make you trip. What can I do in the kitchen? Clean up any spills right away. Avoid walking on wet floors. Keep items that you use a lot in easy-to-reach places. If you need to reach something above you, use a strong step stool that has a grab bar. Keep electrical cords out of the way. Do not use floor polish or wax that makes floors slippery. If you must use wax, use non-skid floor wax. Do not have throw rugs and other things on the floor that can make you trip. What can I do with  my stairs? Do not leave any items on the stairs. Make sure that there are handrails on both sides of the stairs and use them. Fix handrails that are broken or loose. Make sure that handrails are as long as the stairways. Check any carpeting to make sure that it is firmly attached to the stairs. Fix any carpet that is loose or worn. Avoid having throw rugs at the top or bottom of the stairs. If you do have throw rugs, attach them to the floor with carpet tape. Make sure that you have a light switch at the top of the stairs and the bottom of the stairs. If you do not have them, ask someone to add them for you. What else can I do to help prevent falls? Wear shoes that: Do not have high heels. Have rubber bottoms. Are comfortable and fit you well. Are closed at the toe. Do not wear sandals. If you use a stepladder: Make sure that it is fully opened. Do not climb a closed stepladder. Make  sure that both sides of the stepladder are locked into place. Ask someone to hold it for you, if possible. Clearly mark and make sure that you can see: Any grab bars or handrails. First and last steps. Where the edge of each step is. Use tools that help you move around (mobility aids) if they are needed. These include: Canes. Walkers. Scooters. Crutches. Turn on the lights when you go into a dark area. Replace any light bulbs as soon as they burn out. Set up your furniture so you have a clear path. Avoid moving your furniture around. If any of your floors are uneven, fix them. If there are any pets around you, be aware of where they are. Review your medicines with your doctor. Some medicines can make you feel dizzy. This can increase your chance of falling. Ask your doctor what other things that you can do to help prevent falls. This information is not intended to replace advice given to you by your health care provider. Make sure you discuss any questions you have with your health care provider. Document Released: 08/06/2009 Document Revised: 03/17/2016 Document Reviewed: 11/14/2014 Elsevier Interactive Patient Education  2017 [employer].

## 2023-06-07 NOTE — Progress Notes (Signed)
Subjective:   [name] is a 77 y.o. female who presents for Medicare Annual (Subsequent) preventive examination.  Visit Complete: Virtual  I connected with  [name] on 06/07/23 by a audio enabled telemedicine application and verified that I am speaking with the correct person using two identifiers.  Patient Location: Home  Provider Location: Office/Clinic  I discussed the limitations of evaluation and management by telemedicine. The patient expressed understanding and agreed to proceed.  Patient Medicare AWV questionnaire was completed by the patient on 06/06/23; I have confirmed that all information answered by patient is correct and no changes since this date.  Review of Systems     Cardiac Risk Factors include: advanced age (>25men, >47 women);dyslipidemia;hypertension     Objective:    Vital Signs: Unable to obtain new vitals due to this being a telehealth visit.      06/07/2023    3:44 PM 05/23/2023   10:13 AM 01/26/2023    9:05 PM 05/27/2022    1:44 PM 05/24/2021    1:20 PM 12/06/2019    1:11 PM 12/03/2018    4:11 PM  Advanced Directives  Does Patient Have a Medical Advance Directive? No No No No No No No  Does patient want to make changes to medical advance directive?     No - Patient declined    Would patient like information on creating a medical advance directive? No - Patient declined   No - Patient declined  No - Patient declined Yes (MAU/Ambulatory/Procedural Areas - Information given)    Current Medications (verified) Outpatient Encounter Medications as of 06/07/2023  Medication Sig   amLODipine (NORVASC) 10 MG tablet TAKE 1 TABLET BY MOUTH EVERY DAY   dextrose 5 % SOLN 50 mL with methotrexate 25 MG/ML (PF) SOLN 40 mg/m2 Inject 40 mg/m2 into the vein once a week. Every wednesday   dofetilide (TIKOSYN) 125 MCG capsule TAKE 3 CAPSULES BY MOUTH TWICE A DAY   doxazosin (CARDURA) 2 MG tablet TAKE 1 TABLET BY MOUTH EVERY DAY   escitalopram (LEXAPRO) 10 MG  tablet Take 1 tablet (10 mg total) by mouth daily.   esomeprazole (NEXIUM) 40 MG capsule Take 1 capsule (40 mg total) by mouth daily. Take in the morning before breakfast.   folic acid (FOLVITE) 1 MG tablet Take 2 mg by mouth daily.   inFLIXimab 10 mg/kg in sodium chloride 0.9 % Inject 10 mg/kg into the vein every 6 (six) weeks.   loperamide (IMODIUM A-D) 2 MG tablet Take 1 mg by mouth as needed. Dihrrhea   metoprolol tartrate (LOPRESSOR) 25 MG tablet TAKE 1 TABLET (25 MG) BY MOUTH IN THE MORNING AND AT BEDTIME   Multiple Vitamin (MULTIVITAMIN) capsule Take 1 capsule by mouth daily. 1 daily   nitroGLYCERIN (NITROSTAT) 0.4 MG SL tablet Place 1 tablet (0.4 mg total) under the tongue every 5 (five) minutes as needed for chest pain. Do not exceed 3 tabs in 15 minutes   Probiotic Product (PHILLIPS COLON HEALTH) CAPS Take 1 capsule by mouth daily. 1 per day   TUBERCULIN SYR 1CC/27GX1/2" 27G X 1/2" 1 ML MISC    valsartan (DIOVAN) 160 MG tablet TAKE 1 TABLET BY MOUTH EVERY DAY   warfarin (COUMADIN) 2.5 MG tablet TAKE 1/2 TABLET TO 1 TABLET BY MOUTH DAILY AS DIRECTED BY COUMADIN CLINIC   No facility-administered encounter medications on file as of 06/07/2023.    Allergies (verified) Hydralazine, Cefdinir, Codeine, Fosamax [alendronate sodium], Guaifenesin er, Latex, Percocet [oxycodone-acetaminophen], and  Psyllium   History: Past Medical History:  Diagnosis Date   Anxiety    Arthritis    Atrial fibrillation (HCC) 2008   CAD (coronary artery disease)    Crohn's colitis (HCC)    she reports ulcerative colitis beginning in her 28's, biopsies 2008 suggest Crohn's not UC   Depression    Diverticulosis    GERD (gastroesophageal reflux disease)    Hyperlipidemia    Hypertension    IBS (irritable bowel syndrome)    Keloid of skin    Obesity    OSA (obstructive sleep apnea)    Pancreatitis    Paroxysmal atrial fibrillation (HCC)    Pseudoaneurysm of right femoral artery (HCC)    Renal oncocytoma     s/p right nephrectomy   Rheumatoid arthritis (HCC) 05/29/2017   UC (ulcerative colitis) (HCC)    Past Surgical History:  Procedure Laterality Date   ANGIOPLASTY     BREAST BIOPSY  10/24/1994   benign lesion    Cataract surgery Left 11/13/2014   CHOLECYSTECTOMY  10/24/2006   COLONOSCOPY W/ BIOPSIES     CORONARY ANGIOPLASTY     ESOPHAGOGASTRODUODENOSCOPY     NEPHRECTOMY  10/24/2006   right secondary to oncocytoma    RIGHT FEMORAL  PSEUDOANEURYSM & RIGHT GROIN HEMATOMA EVACUATION  02/22/2007   RIGHT RENAL ARTERY REPAIR  10/24/1974   TUBAL LIGATION     Family History  Problem Relation Age of Onset   Hypertension Mother    Coronary artery disease Mother    Stroke Mother    Clotting disorder Mother    Heart disease Mother    Allergies Mother    Colitis Father    Heart disease Father    Clotting disorder Sister    Stroke Brother    Clotting disorder Brother    Cancer Daughter    Asthma Daughter    Asthma Maternal Uncle    Colon cancer Paternal Aunt    Colon polyps Neg Hx    Kidney disease Neg Hx    Stomach cancer Neg Hx    Esophageal cancer Neg Hx    Social History   Socioeconomic History   Marital status: Married    Spouse name: Not on file   Number of children: 3   Years of education: Not on file   Highest education level: 12th grade  Occupational History   Occupation: Retired    Associate Professor: RETIRED    Comment: retired  Tobacco Use   Smoking status: Never   Smokeless tobacco: Never  Vaping Use   Vaping status: Never Used  Substance and Sexual Activity   Alcohol use: No   Drug use: No   Sexual activity: Never  Other Topics Concern   Not on file  Social History Narrative   Married since 1965 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, 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 - adults, 2 grandchildrenNo pets -Daughter has breast cancer   Social Determinants of Health   Financial Resource Strain: Low Risk  (06/06/2023)   Overall Financial Resource Strain (CARDIA)     Difficulty of Paying Living Expenses: Not very hard  Food Insecurity: No Food Insecurity (06/06/2023)   Hunger Vital Sign    Worried About Running Out of Food in the Last Year: Never true    Ran Out of Food in the Last Year: Never true  Transportation Needs: No Transportation Needs (06/06/2023)   PRAPARE - Administrator, Civil Service (Medical): No    Lack of Transportation (Non-Medical): No  Physical Activity: Patient Declined (06/06/2023)   Exercise Vital Sign    Days of Exercise per Week: Patient declined    Minutes of Exercise per Session: Patient declined  Stress: No Stress Concern Present (04/24/2023)   [employer] of Occupational Health - Occupational Stress Questionnaire    Feeling of Stress : Not at all  Social Connections: Unknown (06/06/2023)   Social Connection and Isolation Panel [NHANES]    Frequency of Communication with Friends and Family: More than three times a week    Frequency of Social Gatherings with Friends and Family: More than three times a week    Attends Religious Services: Patient declined    Database administrator or Organizations: Yes    Attends Banker Meetings: 1 to 4 times per year    Marital Status: Married    Tobacco Counseling Counseling given: Not Answered   Clinical Intake:  Pre-visit preparation completed: Yes  Pain : No/denies pain  Nutritional Risks: None Diabetes: No  How often do you need to have someone help you when you read instructions, pamphlets, or other written materials from your doctor or pharmacy?: 1 - Never  Interpreter Needed?: No  Information entered by :: [employer], CMA   Activities of Daily Living    06/06/2023   10:15 PM 05/29/2023    3:22 PM  In your present state of health, do you have any difficulty performing the following activities:  Hearing? 1 1  Comment wears hearing aids   Vision? 0 0  Difficulty concentrating or making decisions? 0 0  Walking or climbing stairs? 1 1   Dressing or bathing? 0 0  Doing errands, shopping? 1 1  Preparing Food and eating ? N N  Using the Toilet? N N  In the past six months, have you accidently leaked urine? Y Y  Do you have problems with loss of bowel control? Y Y  Managing your Medications? N N  Managing your Finances? N N  Housekeeping or managing your Housekeeping? N N    Patient Care Team: Bradd Canary, MD as PCP - General (Family Medicine) Jens Som Madolyn Frieze, MD as PCP - Cardiology (Cardiology) Iva Boop, MD as Consulting Physician (Gastroenterology) Oretha Milch, MD as Consulting Physician (Pulmonary Disease) Nelson Chimes, MD as Consulting Physician (Ophthalmology) Donnetta Hail, MD as Consulting Physician (Rheumatology) Haverstock, Elvin So, MD as Referring Physician (Dermatology)  Indicate any recent Medical Services you may have received from other than [facility] providers in the past year (date may be approximate).     Assessment:   This is a routine wellness examination for [name].  Hearing/Vision screen No results found.  Dietary issues and exercise activities discussed:     Goals Addressed   None    Depression Screen    06/07/2023    3:46 PM 04/25/2023   10:47 AM 08/15/2022    1:14 PM 05/27/2022    1:44 PM 05/24/2021    1:25 PM 12/06/2019    1:15 PM 12/03/2018    4:12 PM  PHQ 2/9 Scores  PHQ - 2 Score 0 0 0 0 0 0 1  PHQ- 9 Score  0 0        Fall Risk    06/06/2023   10:15 PM 05/29/2023    3:22 PM 04/25/2023   10:47 AM 08/15/2022    1:13 PM 05/27/2022    1:44 PM  Fall Risk   Falls in the past year? 0 0 0 0 0  Number falls in past yr: 0  0 0 0  Injury with Fall? 0  0 0 0  Risk for fall due to : No Fall Risks    No Fall Risks  Follow up Falls evaluation completed   Falls evaluation completed Falls evaluation completed    MEDICARE RISK AT HOME:   TIMED UP AND GO:  Was the test performed?  No    Cognitive Function:    12/03/2018    4:13 PM 11/30/2017    9:21 AM 12/14/2015    10:51 AM  MMSE - Mini Mental State Exam  Orientation to time 5 5 5   Orientation to Place 5 5 5   Registration 3 3 3   Attention/ Calculation 5 5 5   Recall 3 3 3   Language- name 2 objects 2 2 2   Language- repeat 1 1 1   Language- follow 3 step command 3 3 3   Language- read & follow direction 1 1 1   Write a sentence 1 1 1   Copy design 1 1 1   Total score 30 30 30         06/07/2023    3:48 PM 05/27/2022    1:52 PM  6CIT Screen  What Year? 0 points 0 points  What month? 0 points 0 points  What time? 0 points 0 points  Count back from 20 0 points 0 points  Months in reverse 0 points 0 points  Repeat phrase 0 points 0 points  Total Score 0 points 0 points    Immunizations Immunization History  Administered Date(s) Administered   Fluad Quad(high Dose 65+) 09/12/2019, 08/03/2021, 08/15/2022   Influenza Split 07/23/2012   Influenza Whole 09/06/2007, 08/11/2008, 08/24/2009, 08/03/2010   Influenza, High Dose Seasonal PF 08/02/2013, 06/29/2016, 07/06/2017, 07/31/2018   Influenza,inj,Quad PF,6+ Mos 06/27/2014, 06/26/2015   Moderna Sars-Covid-2 Vaccination 12/25/2019, 01/22/2020   Pneumococcal Conjugate-13 08/28/2014   Pneumococcal Polysaccharide-23 09/13/2010   Td 05/12/2009   Zoster, Live 02/11/2008    TDAP status: Due, Education has been provided regarding the importance of this vaccine. Advised may receive this vaccine at local pharmacy or Health Dept. Aware to provide a copy of the vaccination record if obtained from local pharmacy or Health Dept. Verbalized acceptance and understanding.  Flu Vaccine status: Due, Education has been provided regarding the importance of this vaccine. Advised may receive this vaccine at local pharmacy or Health Dept. Aware to provide a copy of the vaccination record if obtained from local pharmacy or Health Dept. Verbalized acceptance and understanding.  Pneumococcal vaccine status: Up to date  Covid-19 vaccine status: Information provided on how to  obtain vaccines.   Qualifies for Shingles Vaccine? Yes   Zostavax completed Yes   Shingrix Completed?: No.    Education has been provided regarding the importance of this vaccine. Patient has been advised to call insurance company to determine out of pocket expense if they have not yet received this vaccine. Advised may also receive vaccine at local pharmacy or Health Dept. Verbalized acceptance and understanding.  Screening Tests Health Maintenance  Topic Date Due   FOOT EXAM  Never done   Zoster Vaccines- Shingrix (1 of 2) 08/21/1964   Colonoscopy  09/10/2014   DTaP/Tdap/Td (2 - Tdap) 05/13/2019   COVID-19 Vaccine (3 - Moderna risk series) 02/19/2020   Medicare Annual Wellness (AWV)  05/28/2023   INFLUENZA VACCINE  05/25/2023   Diabetic kidney evaluation - Urine ACR  04/24/2024   Diabetic kidney evaluation - eGFR measurement  05/22/2024   Pneumonia Vaccine 65+  Years old  Completed   DEXA SCAN  Completed   Hepatitis C Screening  Completed   HPV VACCINES  Aged Out   HEMOGLOBIN A1C  Discontinued   OPHTHALMOLOGY EXAM  Discontinued    Health Maintenance  Health Maintenance Due  Topic Date Due   FOOT EXAM  Never done   Zoster Vaccines- Shingrix (1 of 2) 08/21/1964   Colonoscopy  09/10/2014   DTaP/Tdap/Td (2 - Tdap) 05/13/2019   COVID-19 Vaccine (3 - Moderna risk series) 02/19/2020   Medicare Annual Wellness (AWV)  05/28/2023   INFLUENZA VACCINE  05/25/2023    Colorectal cancer screening: No longer required.   Mammogram status: Completed 08/17/22. Repeat every year  Bone Density status: Completed 07/26/21. Results reflect: Bone density results: OSTEOPOROSIS. Repeat every 2 years.  Lung Cancer Screening: (Low Dose CT Chest recommended if Age 93-80 years, 20 pack-year currently smoking OR have quit w/in 15years.) does not qualify.   Additional Screening:  Hepatitis C Screening: does qualify; Completed 06/17/14  Vision Screening: Recommended annual ophthalmology exams for  early detection of glaucoma and other disorders of the eye. Is the patient up to date with their annual eye exam?  Yes  Who is the provider or what is the name of the office in which the patient attends annual eye exams? [facility] in [place] If pt is not established with a provider, would they like to be referred to a provider to establish care? No .   Dental Screening: Recommended annual dental exams for proper oral hygiene  Diabetic Foot Exam: N/a  Community Resource Referral / Chronic Care Management: CRR required this visit?  No   CCM required this visit?  No     Plan:     I have personally reviewed and noted the following in the patient's chart:   Medical and social history Use of alcohol, tobacco or illicit drugs  Current medications and supplements including opioid prescriptions. Patient is not currently taking opioid prescriptions. Functional ability and status Nutritional status Physical activity Advanced directives List of other physicians Hospitalizations, surgeries, and ER visits in previous 12 months Vitals Screenings to include cognitive, depression, and falls Referrals and appointments  In addition, I have reviewed and discussed with patient certain preventive protocols, quality metrics, and best practice recommendations. A written personalized care plan for preventive services as well as general preventive health recommendations were provided to patient.     Donne Anon, CMA   06/07/2023   After Visit Summary: (MyChart) Due to this being a telephonic visit, the after visit summary with patients personalized plan was offered to patient via MyChart   Nurse Notes: None

## 2023-06-13 ENCOUNTER — Ambulatory Visit: Payer: Medicare Other | Attending: Cardiology

## 2023-06-13 DIAGNOSIS — I4891 Unspecified atrial fibrillation: Secondary | ICD-10-CM

## 2023-06-13 DIAGNOSIS — Z5181 Encounter for therapeutic drug level monitoring: Secondary | ICD-10-CM | POA: Diagnosis not present

## 2023-06-13 LAB — POCT INR: INR: 4.3 — AB (ref 2.0–3.0)

## 2023-06-13 NOTE — Patient Instructions (Signed)
Description   HOLD today's dose and then START taking 2.5mg  daily EXCEPT 1.25mg  on Sundays, Tuesdays, Thursdays and Saturday.   Stay consistent with green each week  (2 per week)  Repeat INR in 2 weeks.  [facility] [phone]

## 2023-06-16 DIAGNOSIS — L814 Other melanin hyperpigmentation: Secondary | ICD-10-CM | POA: Diagnosis not present

## 2023-06-16 DIAGNOSIS — L578 Other skin changes due to chronic exposure to nonionizing radiation: Secondary | ICD-10-CM | POA: Diagnosis not present

## 2023-06-16 DIAGNOSIS — L821 Other seborrheic keratosis: Secondary | ICD-10-CM | POA: Diagnosis not present

## 2023-06-16 DIAGNOSIS — L400 Psoriasis vulgaris: Secondary | ICD-10-CM | POA: Diagnosis not present

## 2023-06-16 DIAGNOSIS — C44311 Basal cell carcinoma of skin of nose: Secondary | ICD-10-CM | POA: Diagnosis not present

## 2023-06-16 DIAGNOSIS — L570 Actinic keratosis: Secondary | ICD-10-CM | POA: Diagnosis not present

## 2023-06-19 DIAGNOSIS — K5090 Crohn's disease, unspecified, without complications: Secondary | ICD-10-CM | POA: Diagnosis not present

## 2023-06-19 DIAGNOSIS — Z6841 Body Mass Index (BMI) 40.0 and over, adult: Secondary | ICD-10-CM | POA: Diagnosis not present

## 2023-06-19 DIAGNOSIS — C4492 Squamous cell carcinoma of skin, unspecified: Secondary | ICD-10-CM | POA: Diagnosis not present

## 2023-06-19 DIAGNOSIS — M0589 Other rheumatoid arthritis with rheumatoid factor of multiple sites: Secondary | ICD-10-CM | POA: Diagnosis not present

## 2023-06-19 DIAGNOSIS — M7989 Other specified soft tissue disorders: Secondary | ICD-10-CM | POA: Diagnosis not present

## 2023-06-19 DIAGNOSIS — H209 Unspecified iridocyclitis: Secondary | ICD-10-CM | POA: Diagnosis not present

## 2023-06-19 DIAGNOSIS — Q600 Renal agenesis, unilateral: Secondary | ICD-10-CM | POA: Diagnosis not present

## 2023-06-19 DIAGNOSIS — Z85828 Personal history of other malignant neoplasm of skin: Secondary | ICD-10-CM | POA: Diagnosis not present

## 2023-06-27 ENCOUNTER — Ambulatory Visit: Payer: Medicare Other | Attending: Cardiology

## 2023-06-27 DIAGNOSIS — I4891 Unspecified atrial fibrillation: Secondary | ICD-10-CM | POA: Insufficient documentation

## 2023-06-27 DIAGNOSIS — Z5181 Encounter for therapeutic drug level monitoring: Secondary | ICD-10-CM | POA: Diagnosis not present

## 2023-06-27 LAB — POCT INR: INR: 3.1 — AB (ref 2.0–3.0)

## 2023-06-27 NOTE — Patient Instructions (Signed)
Description   Eat greens today and continue taking 2.5mg  daily EXCEPT 1.25mg  on Sundays, Tuesdays, Thursdays and Saturday.   Stay consistent with green each week  (2 per week)  Repeat INR in 3 weeks.  [facility] [phone]

## 2023-06-28 ENCOUNTER — Ambulatory Visit: Payer: Medicare Other | Admitting: Cardiology

## 2023-06-30 ENCOUNTER — Telehealth: Payer: Self-pay | Admitting: Cardiology

## 2023-06-30 NOTE — Telephone Encounter (Signed)
Returned call to pt. Patient states she has been in AFIB since this past Monday. No chest pain or pressure, SOB, HA, lightheadedness, blurred vision. She's feel dizzy when she stands up. On April 3rd she was prescribed a medrol dose pack and it put her in AFIB immediately and she did have chest pain at that time. She has been in AFIB twice since then including this one. Spoke with the DOD and he will get back to Triage with his recommendations.

## 2023-06-30 NOTE — Telephone Encounter (Signed)
         I was unable to ask other questions, message jumped, before I could finish it, so sorry about that.

## 2023-06-30 NOTE — Telephone Encounter (Signed)
   Appointment was made for Monday(07-03-23). Please call to evaluate.

## 2023-06-30 NOTE — Telephone Encounter (Signed)
Patient c/o Palpitations:  STAT if patient reporting lightheadedness, shortness of breath, or chest pain  How long have you had palpitations/irregular HR/ Afib? Are you having the symptoms now? Patient is in Afib, been in Afib since Monday   Are you currently experiencing lightheadedness, SOB or CP?   Do you have a history of afib (atrial fibrillation) or irregular heart rhythm?   Have you checked your BP or HR? (document readings if available):   Are you experiencing any other symptoms?

## 2023-06-30 NOTE — Telephone Encounter (Signed)
Returned call to pt with Dr. Jenene Slicker recommendation. Pt verbalized understanding. Advised pt to go to the ER if she develops any chest pain or pressure.  Per Dr. Antoine Poche,  The patient has had a history of paroxysmal atrial fibrillation.  She actually has an appointment on 9/9 with the EP and I would keep that appointment.  I do not think there is necessarily a specific change that needs to be made today unless she is feeling particularly symptomatic with the palpitations.  She could take an extra half of her metoprolol a couple of times as needed again if she is symptomatic.

## 2023-07-03 ENCOUNTER — Encounter: Payer: Self-pay | Admitting: Cardiology

## 2023-07-03 ENCOUNTER — Ambulatory Visit: Payer: Medicare Other | Attending: Cardiology | Admitting: Cardiology

## 2023-07-03 VITALS — BP 136/72 | HR 53 | Ht 63.0 in | Wt 257.2 lb

## 2023-07-03 DIAGNOSIS — D6869 Other thrombophilia: Secondary | ICD-10-CM | POA: Diagnosis not present

## 2023-07-03 DIAGNOSIS — I10 Essential (primary) hypertension: Secondary | ICD-10-CM

## 2023-07-03 DIAGNOSIS — I4819 Other persistent atrial fibrillation: Secondary | ICD-10-CM | POA: Diagnosis not present

## 2023-07-03 NOTE — Progress Notes (Signed)
Electrophysiology Office Note:   Date:  07/03/2023  ID:  [name], DOB 15-Aug-1946, MRN [id number]  Primary Cardiologist: Olga Millers, MD Electrophysiologist: None      History of Present Illness:   [name] is a 77 y.o. female with h/o coronary artery disease, atrial fibrillation, hyperlipidemia, hypertension, OSA, renal oncocytoma post nephrectomy, Crohn's disease seen today for  for Electrophysiology evaluation of atrial fibrillation at the request of Reather Littler.    She has a longstanding history of atrial fibrillation.  She has been on Tikosyn for many years.  She presented to the emergency room for for 24 with chest discomfort and palpitations.  She was found to be in atrial fibrillation.  She was on a Medrol Dosepak started the day prior.  Again she presented to the emergency room 05/23/2023 with palpitations and was found to be in atrial fibrillation.  She converted to sinus rhythm during that episode without intervention.  She has had a few episodes of atrial fibrillation, making her feel palpitations.  She has some fatigue associated with atrial fibrillation.  Review of systems complete and found to be negative unless listed in HPI.   EP Information / Studies Reviewed:    EKG is ordered today. Personal review as below.  EKG Interpretation Date/Time:  Monday July 03 2023 11:56:15 EDT Ventricular Rate:  53 PR Interval:  180 QRS Duration:  90 QT Interval:  492 QTC Calculation: 461 R Axis:   14  Text Interpretation: Sinus bradycardia When compared with ECG of 31-May-2023 10:00, No significant change was found Confirmed by Ruwayda Curet ([id number]) on 07/03/2023 12:03:33 PM     Risk Assessment/Calculations:    CHA2DS2-VASc Score = 5   This indicates a 7.2% annual risk of stroke. The patient's score is based upon: CHF History: 0 HTN History: 1 Diabetes History: 0 Stroke History: 0 Vascular Disease History: 1 Age Score: 2 Gender Score: 1              Physical Exam:   VS:  BP 136/72   Pulse (!) 53   Ht 5\' 3"  (1.6 m)   Wt 257 lb 3.2 oz (116.7 kg)   SpO2 96%   BMI 45.56 kg/m    Wt Readings from Last 3 Encounters:  07/03/23 257 lb 3.2 oz (116.7 kg)  05/31/23 258 lb (117 kg)  05/23/23 263 lb 8 oz (119.5 kg)     GEN: Well nourished, well developed in no acute distress NECK: No JVD; No carotid bruits CARDIAC: Regular rate and rhythm, no murmurs, rubs, gallops RESPIRATORY:  Clear to auscultation without rales, wheezing or rhonchi  ABDOMEN: Soft, non-tender, non-distended EXTREMITIES:  No edema; No deformity   ASSESSMENT AND PLAN:    1.  Persistent atrial fibrillation: Currently on dofetilide and metoprolol.  He is continued to have episodes of atrial fibrillation despite these medications.  Unfortunately, due to her morbid obesity, she would not be a good candidate for ablation.  We had a long discussion about the benefits of weight loss for management of atrial fibrillation.  I have told her that she Deivi Huckins need a BMI of around 40 before she is a good candidate for ablation.  She Tamaira Ciriello discuss with her primary physician weight loss aids.  Rumeal Cullipher continue with current management for now.  2.  Coronary artery disease: Nonobstructive disease.  No current chest pain.  Plan per primary cardiology.  3.  Hypertension: Currently well-controlled  4.  Obstructive sleep apnea: CPAP compliance encouraged  5.  Secondary hypercoagulable state: Currently on warfarin for atrial fibrillation  Follow up with Dr. Elberta Fortis in 6 months  Signed, Edmar Blankenburg Jorja Loa, MD

## 2023-07-03 NOTE — Patient Instructions (Signed)

## 2023-07-10 ENCOUNTER — Other Ambulatory Visit (HOSPITAL_BASED_OUTPATIENT_CLINIC_OR_DEPARTMENT_OTHER): Payer: Self-pay | Admitting: Family Medicine

## 2023-07-10 ENCOUNTER — Telehealth: Payer: Self-pay | Admitting: Family Medicine

## 2023-07-10 DIAGNOSIS — Z1231 Encounter for screening mammogram for malignant neoplasm of breast: Secondary | ICD-10-CM

## 2023-07-10 NOTE — Telephone Encounter (Signed)
Shena from [facility] called to inform Dr. Abner Greenspan that they received the prescription for the pt's CPAP. However, she stated that it was missing a signature and sign date. Please advise at [phone] (fax #).

## 2023-07-17 DIAGNOSIS — Z111 Encounter for screening for respiratory tuberculosis: Secondary | ICD-10-CM | POA: Diagnosis not present

## 2023-07-17 DIAGNOSIS — R5383 Other fatigue: Secondary | ICD-10-CM | POA: Diagnosis not present

## 2023-07-17 DIAGNOSIS — Z79899 Other long term (current) drug therapy: Secondary | ICD-10-CM | POA: Diagnosis not present

## 2023-07-17 DIAGNOSIS — M0589 Other rheumatoid arthritis with rheumatoid factor of multiple sites: Secondary | ICD-10-CM | POA: Diagnosis not present

## 2023-07-18 ENCOUNTER — Ambulatory Visit: Payer: Medicare Other | Attending: Cardiology

## 2023-07-18 DIAGNOSIS — I4891 Unspecified atrial fibrillation: Secondary | ICD-10-CM | POA: Insufficient documentation

## 2023-07-18 DIAGNOSIS — Z5181 Encounter for therapeutic drug level monitoring: Secondary | ICD-10-CM | POA: Diagnosis not present

## 2023-07-18 LAB — POCT INR: INR: 2.8 (ref 2.0–3.0)

## 2023-07-18 NOTE — Patient Instructions (Signed)
Description   Cntinue taking 2.5mg  daily EXCEPT 1.25mg  on Sundays, Tuesdays, Thursdays and Saturday.   Stay consistent with green each week  (2 per week)  Repeat INR in 4 weeks.  [facility] [phone]

## 2023-07-19 ENCOUNTER — Institutional Professional Consult (permissible substitution): Payer: Medicare Other | Admitting: Cardiology

## 2023-08-04 ENCOUNTER — Other Ambulatory Visit: Payer: Self-pay | Admitting: Family Medicine

## 2023-08-15 ENCOUNTER — Ambulatory Visit: Payer: Medicare Other | Admitting: Podiatry

## 2023-08-15 ENCOUNTER — Ambulatory Visit: Payer: Medicare Other

## 2023-08-21 ENCOUNTER — Ambulatory Visit (HOSPITAL_BASED_OUTPATIENT_CLINIC_OR_DEPARTMENT_OTHER)
Admission: RE | Admit: 2023-08-21 | Discharge: 2023-08-21 | Disposition: A | Discharge status: Home / Self Care | Payer: Medicare Other | Source: Ambulatory Visit | Attending: Family Medicine | Admitting: Family Medicine

## 2023-08-21 ENCOUNTER — Encounter (HOSPITAL_BASED_OUTPATIENT_CLINIC_OR_DEPARTMENT_OTHER): Payer: Self-pay

## 2023-08-21 ENCOUNTER — Institutional Professional Consult (permissible substitution): Payer: Medicare Other | Admitting: Cardiology

## 2023-08-21 DIAGNOSIS — I6523 Occlusion and stenosis of bilateral carotid arteries: Secondary | ICD-10-CM | POA: Diagnosis not present

## 2023-08-21 DIAGNOSIS — R519 Headache, unspecified: Secondary | ICD-10-CM | POA: Diagnosis not present

## 2023-08-21 DIAGNOSIS — Z1231 Encounter for screening mammogram for malignant neoplasm of breast: Secondary | ICD-10-CM | POA: Diagnosis not present

## 2023-08-24 ENCOUNTER — Other Ambulatory Visit: Payer: Self-pay | Admitting: Cardiology

## 2023-08-24 DIAGNOSIS — I4891 Unspecified atrial fibrillation: Secondary | ICD-10-CM

## 2023-08-24 NOTE — Telephone Encounter (Signed)
Warfarin 2.5mg  refill Afib Last INR 07/18/23 & has appt pending on 08/29/23 Last OV 07/03/23

## 2023-08-28 ENCOUNTER — Telehealth (INDEPENDENT_AMBULATORY_CARE_PROVIDER_SITE_OTHER): Payer: Medicare Other | Admitting: Family Medicine

## 2023-08-28 ENCOUNTER — Ambulatory Visit: Payer: Medicare Other | Admitting: Podiatry

## 2023-08-28 ENCOUNTER — Ambulatory Visit: Payer: Medicare Other | Admitting: Family Medicine

## 2023-08-28 VITALS — BP 168/91 | HR 58 | Ht 63.0 in | Wt 245.0 lb

## 2023-08-28 DIAGNOSIS — R739 Hyperglycemia, unspecified: Secondary | ICD-10-CM | POA: Diagnosis not present

## 2023-08-28 DIAGNOSIS — E6609 Other obesity due to excess calories: Secondary | ICD-10-CM | POA: Diagnosis not present

## 2023-08-28 DIAGNOSIS — Z789 Other specified health status: Secondary | ICD-10-CM

## 2023-08-28 DIAGNOSIS — U071 COVID-19: Secondary | ICD-10-CM | POA: Diagnosis not present

## 2023-08-28 DIAGNOSIS — E559 Vitamin D deficiency, unspecified: Secondary | ICD-10-CM | POA: Diagnosis not present

## 2023-08-28 DIAGNOSIS — I480 Paroxysmal atrial fibrillation: Secondary | ICD-10-CM | POA: Diagnosis not present

## 2023-08-28 DIAGNOSIS — I159 Secondary hypertension, unspecified: Secondary | ICD-10-CM | POA: Diagnosis not present

## 2023-08-28 DIAGNOSIS — E7800 Pure hypercholesterolemia, unspecified: Secondary | ICD-10-CM

## 2023-08-28 DIAGNOSIS — M069 Rheumatoid arthritis, unspecified: Secondary | ICD-10-CM

## 2023-08-28 DIAGNOSIS — M0589 Other rheumatoid arthritis with rheumatoid factor of multiple sites: Secondary | ICD-10-CM | POA: Diagnosis not present

## 2023-08-28 DIAGNOSIS — I10 Essential (primary) hypertension: Secondary | ICD-10-CM | POA: Diagnosis not present

## 2023-08-28 NOTE — Assessment & Plan Note (Signed)
Well controlled, no changes to meds. Encouraged heart healthy diet such as the DASH diet and exercise as tolerated.  °

## 2023-08-28 NOTE — Assessment & Plan Note (Signed)
She was infected in early October. Is improving but still does not feel great. She is trying to move and eat right and hydrate.

## 2023-08-28 NOTE — Assessment & Plan Note (Signed)
Supplement and monitor 

## 2023-08-28 NOTE — Assessment & Plan Note (Addendum)
No significant exacerbation, tolerating meds, following with cariology, she has been cardioverted several times but she continues to convert back. She is following with Dr Elberta Fortis and Jens Som

## 2023-08-28 NOTE — Assessment & Plan Note (Signed)
Encourage heart healthy diet such as MIND or DASH diet, increase exercise, avoid trans fats, simple carbohydrates and processed foods, consider a krill or fish or flaxseed oil cap daily.  °

## 2023-08-28 NOTE — Progress Notes (Signed)
MyChart Video Visit    Virtual Visit via Video Note   This patient is at least at moderate risk for complications without adequate follow up. This format is felt to be most appropriate for this patient at this time. Physical exam was limited by quality of the video and audio technology used for the visit. Juanetta, CMA was able to get the patient set up on a video visit.  Patient location: home Patient and provider in visit Provider location: Office  I discussed the limitations of evaluation and management by telemedicine and the availability of in person appointments. The patient expressed understanding and agreed to proceed.  Visit Date: 08/28/2023  Today's healthcare provider: Danise Edge, MD     Subjective:    Patient ID: [name], female    DOB: 03/22/1946, 77 y.o.   MRN: [id number]  Chief Complaint  Patient presents with  . Follow-up    HPI Discussed the use of AI scribe software for clinical note transcription with the patient, who gave verbal consent to proceed.  History of Present Illness   The patient, with a history of autoimmune diseases and recent COVID-19 infection, presents with lingering weakness and nausea post-COVID-19. The patient denies any respiratory symptoms such as congestion or difficulty breathing. The patient reports that the severe headache experienced during the acute phase of the infection has resolved. The patient has had several episodes of AFib since April, which have self-resolved within a week each time. The patient also reported a past issue of pain running from the middle of the left buttock to the left labia, which has since resolved. The patient is currently on Remicade infusions for autoimmune diseases and Coumadin for AFib.        Past Medical History:  Diagnosis Date  . Anxiety   . Arthritis   . Atrial fibrillation (HCC) 2008  . CAD (coronary artery disease)   . Crohn's colitis (HCC)    she reports ulcerative colitis  beginning in her 53's, biopsies 2008 suggest Crohn's not UC  . Depression   . Diverticulosis   . GERD (gastroesophageal reflux disease)   . Hyperlipidemia   . Hypertension   . IBS (irritable bowel syndrome)   . Keloid of skin   . Obesity   . OSA (obstructive sleep apnea)   . Pancreatitis   . Paroxysmal atrial fibrillation (HCC)   . Pseudoaneurysm of right femoral artery (HCC)   . Renal oncocytoma    s/p right nephrectomy  . Rheumatoid arthritis (HCC) 05/29/2017  . UC (ulcerative colitis) [facility])     Past Surgical History:  Procedure Laterality Date  . ANGIOPLASTY    . BREAST BIOPSY  10/24/1994   benign lesion   . Cataract surgery Left 11/13/2014  . CHOLECYSTECTOMY  10/24/2006  . COLONOSCOPY W/ BIOPSIES    . CORONARY ANGIOPLASTY    . ESOPHAGOGASTRODUODENOSCOPY    . NEPHRECTOMY  10/24/2006   right secondary to oncocytoma   . RIGHT FEMORAL  PSEUDOANEURYSM & RIGHT GROIN HEMATOMA EVACUATION  02/22/2007  . RIGHT RENAL ARTERY REPAIR  10/24/1974  . TUBAL LIGATION      Family History  Problem Relation Age of Onset  . Hypertension Mother   . Coronary artery disease Mother   . Stroke Mother   . Clotting disorder Mother   . Heart disease Mother   . Allergies Mother   . Colitis Father   . Heart disease Father   . Clotting disorder Sister   . Stroke Brother   .  Clotting disorder Brother   . Cancer Daughter   . Asthma Daughter   . Asthma Maternal Uncle   . Colon cancer Paternal Aunt   . Colon polyps Neg Hx   . Kidney disease Neg Hx   . Stomach cancer Neg Hx   . Esophageal cancer Neg Hx     Social History   Socioeconomic History  . Marital status: Married    Spouse name: Not on file  . Number of children: 3  . Years of education: Not on file  . Highest education level: 12th grade  Occupational History  . Occupation: Retired    Associate Professor: RETIRED    Comment: retired  Tobacco Use  . Smoking status: Never  . Smokeless tobacco: Never  Vaping Use  . Vaping status:  Never Used  Substance and Sexual Activity  . Alcohol use: No  . Drug use: No  . Sexual activity: Never  Other Topics Concern  . 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 Difficulty of Paying Living Expenses: Not very hard  Food Insecurity: No Food Insecurity (06/06/2023)   Hunger Vital Sign   . Worried About Programme researcher, broadcasting/film/video in the Last Year: Never true   . Ran Out of Food in the Last Year: Never true  Transportation Needs: No Transportation Needs (06/06/2023)   PRAPARE - Transportation   . Lack of Transportation (Medical): No   . Lack of Transportation (Non-Medical): No  Physical Activity: Patient Declined (06/06/2023)   Exercise Vital Sign   . Days of Exercise per Week: Patient declined   . Minutes of Exercise per Session: Patient declined  Stress: No Stress Concern Present (04/24/2023)   [employer] of Occupational Health - Occupational Stress Questionnaire   . Feeling of Stress : Not at all  Social Connections: Unknown (06/06/2023)   Social Connection and Isolation Panel [NHANES]   . Frequency of Communication with Friends and Family: More than three times a week   . Frequency of Social Gatherings with Friends and Family: More than three times a week   . Attends Religious Services: Patient declined   . Active Member of Clubs or Organizations: Yes   . Attends Banker Meetings: 1 to 4 times per year   . Marital Status: Married  Catering manager Violence: Not At Risk (06/07/2023)   Humiliation, Afraid, Rape, and Kick questionnaire   . Fear of Current or Ex-Partner: No   . Emotionally Abused: No   . Physically Abused: No   . Sexually Abused: No    Outpatient Medications Prior to  Visit  Medication Sig Dispense Refill  . amLODipine (NORVASC) 10 MG tablet TAKE 1 TABLET BY MOUTH EVERY DAY 90 tablet 2  . dextrose 5 % SOLN 50 mL with methotrexate 25 MG/ML (PF) SOLN 40 mg/m2 Inject 40 mg/m2 into the vein once a week. Every wednesday    . dofetilide (TIKOSYN) 125 MCG capsule TAKE 3 CAPSULES BY MOUTH TWICE A DAY 540 capsule 1  . doxazosin (CARDURA) 2 MG tablet TAKE 1 TABLET BY MOUTH EVERY DAY 90 tablet 2  . escitalopram (LEXAPRO) 10 MG tablet TAKE 1 TABLET BY MOUTH EVERY DAY 90 tablet 1  . esomeprazole (NEXIUM) 40 MG capsule  Take 1 capsule (40 mg total) by mouth daily. Take in the morning before breakfast. 30 capsule 11  . folic acid (FOLVITE) 1 MG tablet Take 2 mg by mouth daily.    Marland Kitchen inFLIXimab 10 mg/kg in sodium chloride 0.9 % Inject 10 mg/kg into the vein every 6 (six) weeks.    Marland Kitchen loperamide (IMODIUM A-D) 2 MG tablet Take 1 mg by mouth as needed. Dihrrhea    . metoprolol tartrate (LOPRESSOR) 25 MG tablet TAKE 1 TABLET (25 MG) BY MOUTH IN THE MORNING AND AT BEDTIME 180 tablet 3  . Multiple Vitamin (MULTIVITAMIN) capsule Take 1 capsule by mouth daily. 1 daily    . nitroGLYCERIN (NITROSTAT) 0.4 MG SL tablet Place 1 tablet (0.4 mg total) under the tongue every 5 (five) minutes as needed for chest pain. Do not exceed 3 tabs in 15 minutes 75 tablet 3  . Probiotic Product ([facility] COLON HEALTH) CAPS Take 1 capsule by mouth daily. 1 per day    . TUBERCULIN SYR 1CC/27GX1/2" 27G X 1/2" 1 ML MISC     . valsartan (DIOVAN) 160 MG tablet TAKE 1 TABLET BY MOUTH EVERY DAY 90 tablet 3  . warfarin (COUMADIN) 2.5 MG tablet TAKE 1/2 TABLET TO 1 TABLET BY MOUTH DAILY AS DIRECTED BY COUMADIN CLINIC 75 tablet 1   No facility-administered medications prior to visit.    Allergies  Allergen Reactions  . Hydralazine Diarrhea and Nausea Only    Anorexia, upset stomach, burning pain in abdomin  . Cefdinir     Pt cannot take; may interfere with AFib.  . Codeine     Heart beats fast   . Fosamax  [Alendronate Sodium] Other (See Comments)    GI upset  . Guaifenesin Er Nausea And Vomiting and Other (See Comments)    Headaches; can tolerate liquid.  . Latex Other (See Comments)    Breathing problems  . Percocet [Oxycodone-Acetaminophen]     Itching & swelling in jaw  . Psyllium Other (See Comments)    Review of Systems  Constitutional:  Positive for malaise/fatigue. Negative for fever.  HENT:  Negative for congestion.   Eyes:  Negative for blurred vision.  Respiratory:  Negative for shortness of breath.   Cardiovascular:  Positive for palpitations. Negative for chest pain and leg swelling.  Gastrointestinal:  Negative for abdominal pain, blood in stool and nausea.  Genitourinary:  Negative for dysuria and frequency.  Musculoskeletal:  Negative for falls.  Skin:  Negative for rash.  Neurological:  Negative for dizziness, loss of consciousness and headaches.  Endo/Heme/Allergies:  Negative for environmental allergies.  Psychiatric/Behavioral:  Negative for depression. The patient is not nervous/anxious.        Objective:    Physical Exam Constitutional:      General: She is not in acute distress.    Appearance: Normal appearance. She is not ill-appearing or toxic-appearing.  HENT:     Head: Normocephalic and atraumatic.     Right Ear: External ear normal.     Left Ear: External ear normal.     Nose: Nose normal.  Eyes:     General:        Right eye: No discharge.        Left eye: No discharge.  Pulmonary:     Effort: Pulmonary effort is normal.  Skin:    Findings: No rash.  Neurological:     Mental Status: She is alert and oriented to person, place, and time.  Psychiatric:  Behavior: Behavior normal.    BP (!) 168/91 Comment: Blood pressure reading taken at home  Pulse (!) 58 Comment: Pulse was taken at home  Ht 5\' 3"  (1.6 m) Comment: Pt stated  Wt 245 lb (111.1 kg) Comment: Pt stated  BMI 43.40 kg/m  Wt Readings from Last 3 Encounters:  08/28/23  245 lb (111.1 kg)  07/03/23 257 lb 3.2 oz (116.7 kg)  05/31/23 258 lb (117 kg)       Assessment & Plan:  Vitamin D deficiency Assessment & Plan: Supplement and monitor   Statin intolerance Assessment & Plan: Does not tolerate statins   Rheumatoid arthritis, involving unspecified site, unspecified whether rheumatoid factor present [facility]) Assessment & Plan: Following with rheumatology, tolerating Remicade   Paroxysmal atrial fibrillation (HCC) Assessment & Plan: No significant exacerbation, tolerating meds, following with cariology, she has been cardioverted several times but she continues to convert back. She is following with Dr Elberta Fortis and Jens Som   Obesity due to excess calories, unspecified class, unspecified whether serious comorbidity present Assessment & Plan: Encouraged DASH or MIND diet, decrease po intake and increase exercise as tolerated. Needs 7-8 hours of sleep nightly. Avoid trans fats, eat small, frequent meals every 4-5 hours with lean proteins, complex carbs and healthy fats. Minimize simple carbs, high fat foods and processed foods   Secondary hypertension Assessment & Plan: Well controlled, no changes to meds. Encouraged heart healthy diet such as the DASH diet and exercise as tolerated.     Pure hypercholesterolemia Assessment & Plan: Encourage heart healthy diet such as MIND or DASH diet, increase exercise, avoid trans fats, simple carbohydrates and processed foods, consider a krill or fish or flaxseed oil cap daily.    Hyperglycemia Assessment & Plan: hgba1c acceptable, minimize simple carbs. Increase exercise as tolerated.    Essential hypertension Assessment & Plan: Well controlled, no changes to meds. Encouraged heart healthy diet such as the DASH diet and exercise as tolerated.    COVID-19 Assessment & Plan: She was infected in early October. Is improving but still does not feel great. She is trying to move and eat right and hydrate.        Assessment and Plan    Post-Acute Sequelae of SARS-CoV-2 infection (PASC) Residual weakness and nausea following COVID-19 infection in early October. No respiratory symptoms. Headache resolved within 2-3 days of acute infection. -Continue supportive care including hydration, deep breathing exercises, and physical activity as tolerated. -Consider adding fish oil supplement for overall health, pending discussion with Coumadin clinic.  Autoimmune diseases and Remicade infusions Increased risk for severe COVID-19 due to underlying autoimmune conditions and immunosuppressive therapy. Next Remicade infusion scheduled for today. -Continue Remicade infusions as scheduled by rheumatologist.  Vaccination considerations Tolerated initial COVID-19 vaccinations well. Discussed potential benefits of annual flu shot, COVID-19 booster, and RSV vaccination (Arexvy). -Consider receiving flu shot, COVID-19 booster, and RSV vaccination, spacing out each shot by 2-3 weeks.  Atrial Fibrillation Multiple episodes of AFib since April, each resolving within approximately one week. Currently managed with Tikosyn. -Continue follow-up with cardiologist Dr. Jens Som.  Possible Herpes Zoster Previous episode of pain from left buttock to left labia, with associated sore. Pain and sore have resolved. -If symptoms recur, referral to Gynecology for evaluation.  General Health Maintenance -Plan for follow-up appointment and repeat blood work in 3-4 months. -Consider adding fish oil supplement, pending discussion with Coumadin clinic. -Consider receiving flu shot, COVID-19 booster, and RSV vaccination, spacing out each shot by 2-3 weeks.  I discussed the assessment and treatment plan with the patient. The patient was provided an opportunity to ask questions and all were answered. The patient agreed with the plan and demonstrated an understanding of the instructions.   The patient was advised to call  back or seek an in-person evaluation if the symptoms worsen or if the condition fails to improve as anticipated.  Danise Edge, MD [facility] [phone] (phone) [phone] (fax)  [facility]

## 2023-08-28 NOTE — Assessment & Plan Note (Signed)
hgba1c acceptable, minimize simple carbs. Increase exercise as tolerated.  

## 2023-08-28 NOTE — Assessment & Plan Note (Addendum)
Following with rheumatology, tolerating Remicade

## 2023-08-28 NOTE — Assessment & Plan Note (Signed)
Does not tolerate statins.

## 2023-08-28 NOTE — Assessment & Plan Note (Signed)
Encouraged DASH or MIND diet, decrease po intake and increase exercise as tolerated. Needs 7-8 hours of sleep nightly. Avoid trans fats, eat small, frequent meals every 4-5 hours with lean proteins, complex carbs and healthy fats. Minimize simple carbs, high fat foods and processed foods 

## 2023-08-29 ENCOUNTER — Ambulatory Visit: Payer: Medicare Other | Attending: Cardiology

## 2023-08-29 ENCOUNTER — Encounter: Payer: Self-pay | Admitting: Podiatry

## 2023-08-29 ENCOUNTER — Ambulatory Visit (INDEPENDENT_AMBULATORY_CARE_PROVIDER_SITE_OTHER): Payer: Medicare Other | Admitting: Podiatry

## 2023-08-29 DIAGNOSIS — Z5181 Encounter for therapeutic drug level monitoring: Secondary | ICD-10-CM | POA: Diagnosis not present

## 2023-08-29 DIAGNOSIS — B351 Tinea unguium: Secondary | ICD-10-CM | POA: Diagnosis not present

## 2023-08-29 DIAGNOSIS — M79675 Pain in left toe(s): Secondary | ICD-10-CM | POA: Diagnosis not present

## 2023-08-29 DIAGNOSIS — Z7901 Long term (current) use of anticoagulants: Secondary | ICD-10-CM

## 2023-08-29 DIAGNOSIS — M79674 Pain in right toe(s): Secondary | ICD-10-CM

## 2023-08-29 DIAGNOSIS — I739 Peripheral vascular disease, unspecified: Secondary | ICD-10-CM

## 2023-08-29 DIAGNOSIS — I4891 Unspecified atrial fibrillation: Secondary | ICD-10-CM | POA: Insufficient documentation

## 2023-08-29 LAB — POCT INR: INR: 2.1 (ref 2.0–3.0)

## 2023-08-29 MED ORDER — CICLOPIROX 8 % EX SOLN: Freq: Every day | CUTANEOUS | 0 refills | Status: DC

## 2023-08-29 NOTE — Progress Notes (Signed)
  Subjective:  Patient ID: [name], female    DOB: 1946/01/12,  MRN: [id number]  Chief Complaint  Patient presents with   RFC    Nail Trim. Clotting disorder, PVD    77 y.o. female presents with the above complaint. History confirmed with patient. Patient presenting with pain related to dystrophic thickened elongated nails. Patient is unable to trim own nails related to nail dystrophy and/or mobility issues. Patient does not have a history of T2DM but does have history PVD and has a clotting disorder.   Objective:  Physical Exam: warm, good capillary refill, DP and PT pulses 2/4 bilateral nail exam onychomycosis of the toenails, onycholysis, and dystrophic nails DP pulses palpable, PT pulses palpable, and protective sensation intact Left Foot:  Pain with palpation of nails due to elongation and dystrophic growth.  Right Foot: Pain with palpation of nails due to elongation and dystrophic growth.   Assessment:   1. Pain due to onychomycosis of toenails of both feet   2. PVD (peripheral vascular disease) (HCC)   3. Current use of anticoagulant therapy      Plan:  Patient was evaluated and treated and all questions answered.  #Onychomycosis with pain  -Nails palliatively debrided as below. -Educated on self-care  Procedure: Nail Debridement Rationale: Pain Type of Debridement: manual, sharp debridement. Instrumentation: Nail nipper, rotary burr. Number of Nails: 10  Return in about 3 months (around 11/29/2023) for RFC.         Corinna Gab, DPM [facility] / [facility]

## 2023-08-29 NOTE — Patient Instructions (Signed)
Description   Cntinue taking 2.5mg  daily EXCEPT 1.25mg  on Sundays, Tuesdays, Thursdays and Saturday.   Stay consistent with green each week  (2 per week)  Repeat INR in 5 weeks.  Coumadin Clinic [phone]

## 2023-08-29 NOTE — Addendum Note (Signed)
Addended by: Carlena Hurl F on: 08/29/2023 10:51 AM   Modules accepted: Orders

## 2023-09-05 ENCOUNTER — Encounter: Payer: Self-pay | Admitting: Family Medicine

## 2023-09-05 ENCOUNTER — Emergency Department (HOSPITAL_BASED_OUTPATIENT_CLINIC_OR_DEPARTMENT_OTHER): Payer: Medicare Other

## 2023-09-05 ENCOUNTER — Encounter (HOSPITAL_BASED_OUTPATIENT_CLINIC_OR_DEPARTMENT_OTHER): Payer: Self-pay | Admitting: Emergency Medicine

## 2023-09-05 ENCOUNTER — Other Ambulatory Visit: Payer: Self-pay

## 2023-09-05 DIAGNOSIS — K5730 Diverticulosis of large intestine without perforation or abscess without bleeding: Secondary | ICD-10-CM | POA: Diagnosis not present

## 2023-09-05 DIAGNOSIS — Z7901 Long term (current) use of anticoagulants: Secondary | ICD-10-CM | POA: Diagnosis not present

## 2023-09-05 DIAGNOSIS — Z79899 Other long term (current) drug therapy: Secondary | ICD-10-CM | POA: Insufficient documentation

## 2023-09-05 DIAGNOSIS — I2510 Atherosclerotic heart disease of native coronary artery without angina pectoris: Secondary | ICD-10-CM | POA: Insufficient documentation

## 2023-09-05 DIAGNOSIS — K5090 Crohn's disease, unspecified, without complications: Secondary | ICD-10-CM | POA: Diagnosis not present

## 2023-09-05 DIAGNOSIS — N189 Chronic kidney disease, unspecified: Secondary | ICD-10-CM | POA: Diagnosis not present

## 2023-09-05 DIAGNOSIS — M47814 Spondylosis without myelopathy or radiculopathy, thoracic region: Secondary | ICD-10-CM | POA: Diagnosis not present

## 2023-09-05 DIAGNOSIS — M546 Pain in thoracic spine: Secondary | ICD-10-CM | POA: Diagnosis not present

## 2023-09-05 DIAGNOSIS — Z91040 Latex allergy status: Secondary | ICD-10-CM | POA: Diagnosis not present

## 2023-09-05 DIAGNOSIS — R918 Other nonspecific abnormal finding of lung field: Secondary | ICD-10-CM | POA: Diagnosis not present

## 2023-09-05 DIAGNOSIS — N201 Calculus of ureter: Secondary | ICD-10-CM | POA: Diagnosis not present

## 2023-09-05 DIAGNOSIS — N132 Hydronephrosis with renal and ureteral calculous obstruction: Secondary | ICD-10-CM | POA: Insufficient documentation

## 2023-09-05 NOTE — Telephone Encounter (Signed)
Can double book to add her in

## 2023-09-05 NOTE — Telephone Encounter (Signed)
Spoke to patient via telephone and schedule appt 09/25/23 at 10:30. Address provided.  Nothing further needed.

## 2023-09-05 NOTE — Telephone Encounter (Signed)
Patient will be considered new. Last seen in 2020. Just wanting to verify that you are okay with double booking a consult?

## 2023-09-05 NOTE — ED Triage Notes (Signed)
Pt c/o upper back pain since last night; no injury; denies cough

## 2023-09-06 ENCOUNTER — Emergency Department (HOSPITAL_BASED_OUTPATIENT_CLINIC_OR_DEPARTMENT_OTHER)
Admission: EM | Admit: 2023-09-06 | Discharge: 2023-09-06 | Disposition: A | Discharge status: Home / Self Care | Payer: Medicare Other | Attending: Emergency Medicine | Admitting: Emergency Medicine

## 2023-09-06 ENCOUNTER — Emergency Department (HOSPITAL_BASED_OUTPATIENT_CLINIC_OR_DEPARTMENT_OTHER): Payer: Medicare Other

## 2023-09-06 ENCOUNTER — Other Ambulatory Visit: Payer: Self-pay

## 2023-09-06 DIAGNOSIS — N201 Calculus of ureter: Secondary | ICD-10-CM | POA: Diagnosis not present

## 2023-09-06 DIAGNOSIS — R918 Other nonspecific abnormal finding of lung field: Secondary | ICD-10-CM | POA: Diagnosis not present

## 2023-09-06 DIAGNOSIS — M546 Pain in thoracic spine: Secondary | ICD-10-CM

## 2023-09-06 DIAGNOSIS — K5090 Crohn's disease, unspecified, without complications: Secondary | ICD-10-CM | POA: Diagnosis not present

## 2023-09-06 DIAGNOSIS — K5730 Diverticulosis of large intestine without perforation or abscess without bleeding: Secondary | ICD-10-CM | POA: Diagnosis not present

## 2023-09-06 LAB — CBC WITH DIFFERENTIAL/PLATELET
Abs Immature Granulocytes: 0.04 10*3/uL (ref 0.00–0.07)
Basophils Absolute: 0.1 10*3/uL (ref 0.0–0.1)
Basophils Relative: 1 %
Eosinophils Absolute: 0.1 10*3/uL (ref 0.0–0.5)
Eosinophils Relative: 1 %
HCT: 38.1 % (ref 36.0–46.0)
Hemoglobin: 12.5 g/dL (ref 12.0–15.0)
Immature Granulocytes: 1 %
Lymphocytes Relative: 28 %
Lymphs Abs: 2.4 10*3/uL (ref 0.7–4.0)
MCH: 32.3 pg (ref 26.0–34.0)
MCHC: 32.8 g/dL (ref 30.0–36.0)
MCV: 98.4 fL (ref 80.0–100.0)
Monocytes Absolute: 0.8 10*3/uL (ref 0.1–1.0)
Monocytes Relative: 9 %
Neutro Abs: 5.1 10*3/uL (ref 1.7–7.7)
Neutrophils Relative %: 60 %
Platelets: 227 10*3/uL (ref 150–400)
RBC: 3.87 MIL/uL (ref 3.87–5.11)
RDW: 16.7 % — ABNORMAL HIGH (ref 11.5–15.5)
WBC: 8.5 10*3/uL (ref 4.0–10.5)
nRBC: 0 % (ref 0.0–0.2)

## 2023-09-06 LAB — BASIC METABOLIC PANEL
Anion gap: 7 (ref 5–15)
BUN: 18 mg/dL (ref 8–23)
CO2: 26 mmol/L (ref 22–32)
Calcium: 8.7 mg/dL — ABNORMAL LOW (ref 8.9–10.3)
Chloride: 103 mmol/L (ref 98–111)
Creatinine, Ser: 1.24 mg/dL — ABNORMAL HIGH (ref 0.44–1.00)
GFR, Estimated: 45 mL/min — ABNORMAL LOW (ref >60)
Glucose, Bld: 102 mg/dL — ABNORMAL HIGH (ref 70–99)
Potassium: 3.7 mmol/L (ref 3.5–5.1)
Sodium: 136 mmol/L (ref 135–145)

## 2023-09-06 LAB — TROPONIN I (HIGH SENSITIVITY)
Troponin I (High Sensitivity): 14 ng/L (ref <18)
Troponin I (High Sensitivity): 14 ng/L (ref <18)

## 2023-09-06 LAB — PROTIME-INR
INR: 2 — ABNORMAL HIGH (ref 0.8–1.2)
Prothrombin Time: 23 s — ABNORMAL HIGH (ref 11.4–15.2)

## 2023-09-06 MED ORDER — FENTANYL CITRATE PF 50 MCG/ML IJ SOSY
50.0000 ug | PREFILLED_SYRINGE | Freq: Once | INTRAMUSCULAR | Status: AC
  Administered 2023-09-06: 50 ug via INTRAVENOUS
  Filled 2023-09-06 (×2): qty 1

## 2023-09-06 MED ORDER — IOHEXOL 350 MG/ML SOLN
100.0000 mL | Freq: Once | INTRAVENOUS | Status: AC | PRN
  Administered 2023-09-06: 100 mL via INTRAVENOUS

## 2023-09-06 MED ORDER — IOHEXOL 350 MG/ML SOLN: 80.0000 mL | Freq: Once | INTRAVENOUS | Status: DC | PRN

## 2023-09-06 MED ORDER — HYDROCODONE-ACETAMINOPHEN 5-325 MG PO TABS: 1.0000 | ORAL_TABLET | Freq: Four times a day (QID) | ORAL | 0 refills | Status: DC | PRN

## 2023-09-06 NOTE — ED Notes (Signed)
Pt daughter informed nurse of back pain radiating into chest, EDP notified

## 2023-09-06 NOTE — ED Provider Notes (Signed)
[place] EMERGENCY DEPARTMENT AT [facility]  Provider Note  CSN: [id number] Arrival date & time: 09/05/23 2134  History Chief Complaint  Patient presents with   Back Pain    [name] is a 77 y.o. female with history of pAF on Coumadin, CAD s/p MI in 2008 here initially for mid/upper back pain, onset about 24 hours ago, persistent through the day. No falls or injuries. While waiting for evaluation, she began having some anterior chest pain as well. No SOB. No pain radiating into arms.    Home Medications Prior to Admission medications   Medication Sig Start Date End Date Taking? Authorizing Provider  HYDROcodone-acetaminophen (NORCO/VICODIN) 5-325 MG tablet Take 1 tablet by mouth every 6 (six) hours as needed for severe pain (pain score 7-10). 09/06/23  Yes Pollyann Savoy, MD  amLODipine (NORVASC) 10 MG tablet TAKE 1 TABLET BY MOUTH EVERY DAY 02/16/23   Lewayne Bunting, MD  ciclopirox [facility]) 8 % solution Apply topically at bedtime. Apply over nail and surrounding skin. Apply daily over previous coat. After seven (7) days, may remove with alcohol and continue cycle. 08/29/23   Standiford, Jenelle Mages, DPM  dextrose 5 % SOLN 50 mL with methotrexate 25 MG/ML (PF) SOLN 40 mg/m2 Inject 40 mg/m2 into the vein once a week. Every wednesday    [provider]  dofetilide (TIKOSYN) 125 MCG capsule TAKE 3 CAPSULES BY MOUTH TWICE A DAY 05/22/23   Lewayne Bunting, MD  doxazosin (CARDURA) 2 MG tablet TAKE 1 TABLET BY MOUTH EVERY DAY 02/16/23   Lewayne Bunting, MD  escitalopram (LEXAPRO) 10 MG tablet TAKE 1 TABLET BY MOUTH EVERY DAY 08/04/23   Bradd Canary, MD  esomeprazole (NEXIUM) 40 MG capsule Take 1 capsule (40 mg total) by mouth daily. Take in the morning before breakfast. 01/15/18   Esterwood, Amy S, PA-C  folic acid (FOLVITE) 1 MG tablet Take 2 mg by mouth daily. 09/25/19   [provider]  inFLIXimab 10 mg/kg in sodium chloride 0.9 % Inject 10  mg/kg into the vein every 6 (six) weeks.    [provider]  loperamide (IMODIUM A-D) 2 MG tablet Take 1 mg by mouth as needed. Dihrrhea    [provider]  metoprolol tartrate (LOPRESSOR) 25 MG tablet TAKE 1 TABLET (25 MG) BY MOUTH IN THE MORNING AND AT BEDTIME 02/03/23   Lewayne Bunting, MD  Multiple Vitamin (MULTIVITAMIN) capsule Take 1 capsule by mouth daily. 1 daily    [provider]  nitroGLYCERIN (NITROSTAT) 0.4 MG SL tablet Place 1 tablet (0.4 mg total) under the tongue every 5 (five) minutes as needed for chest pain. Do not exceed 3 tabs in 15 minutes 08/20/18   Lewayne Bunting, MD  Probiotic Product [facility] COLON HEALTH) CAPS Take 1 capsule by mouth daily. 1 per day    [provider]  TUBERCULIN SYR 1CC/27GX1/2" 27G X 1/2" 1 ML MISC     [provider]  valsartan (DIOVAN) 160 MG tablet TAKE 1 TABLET BY MOUTH EVERY DAY 05/01/23   Lewayne Bunting, MD  warfarin (COUMADIN) 2.5 MG tablet TAKE 1/2 TABLET TO 1 TABLET BY MOUTH DAILY AS DIRECTED BY COUMADIN CLINIC 08/24/23   Lewayne Bunting, MD     Allergies    Hydralazine, Cefdinir, Codeine, Fosamax [alendronate sodium], Guaifenesin er, Latex, Percocet [oxycodone-acetaminophen], and Psyllium   Review of Systems   Review of Systems Please see HPI for pertinent positives and  negatives  Physical Exam BP (!) 176/83   Pulse 69   Temp 98 F (36.7 C) (Oral)   Resp 14   Ht 5\' 2"  (1.575 m)   Wt 110.7 kg   SpO2 99%   BMI 44.63 kg/m   Physical Exam Vitals and nursing note reviewed.  Constitutional:      Appearance: Normal appearance.  HENT:     Head: Normocephalic and atraumatic.     Nose: Nose normal.     Mouth/Throat:     Mouth: Mucous membranes are moist.  Eyes:     Extraocular Movements: Extraocular movements intact.     Conjunctiva/sclera: Conjunctivae normal.  Cardiovascular:     Rate and Rhythm: Normal rate.  Pulmonary:     Effort: Pulmonary effort is normal.      Breath sounds: Normal breath sounds.  Abdominal:     General: Abdomen is flat.     Palpations: Abdomen is soft.     Tenderness: There is no abdominal tenderness.  Musculoskeletal:        General: Tenderness (L thoracic paraspinal muscles) present. No swelling. Normal range of motion.     Cervical back: Neck supple.  Skin:    General: Skin is warm and dry.  Neurological:     General: No focal deficit present.     Mental Status: She is alert.  Psychiatric:        Mood and Affect: Mood normal.     ED Results / Procedures / Treatments   EKG None  Procedures Procedures  Medications Ordered in the ED Medications  fentaNYL (SUBLIMAZE) injection 50 mcg (50 mcg Intravenous Given 09/06/23 0418)  iohexol (OMNIPAQUE) 350 MG/ML injection 100 mL (100 mLs Intravenous Contrast Given 09/06/23 0335)    Initial Impression and Plan  Patient here with L upper back pain, initially isolated to the back, but now also having chest pain which was improved with APAP given by daughter. She has an elevated BP raising concern for dissection, lower suspicion for ACS. I personally viewed the images from radiology studies and agree with radiologist interpretation: Xray of T-spine done from triage is negative. Will check labs and send for CTA. Pain medication for comfort.   ED Course   Clinical Course as of 09/06/23 0543  Wed Sep 06, 2023  0301 CBC is unremarkable.  [CS]  0306 INR is therapeutic.  [CS]  0313 BMP with mild CKD at baseline.  [CS]  0324 Initial Trop is neg.  [CS]  0433 Repeat trop remains normal.  [CS]  0529 Patient reports back pain is resolved, but now just having some headache. She had APAP earlier tonight (given by daughter). Would avoid NSAIDs due to CKD and anticoagulation use. Still awaiting CT results.  [CS]  0540 I personally viewed the images from radiology studies and agree with radiologist interpretation: CTA is neg for dissection. She has a ureteral stone, but no signs of  hydronephrosis, so unlikely to be causing her pain. She is aware of other incidental findings and will follow up with PCP for further evaluation. Plan discharge with Rx for pain medication if needed. RTED for any other concerns.  [CS]    Clinical Course User Index [CS] Pollyann Savoy, MD     MDM Rules/Calculators/A&P Medical Decision Making Given presenting complaint, I considered that admission might be necessary. After review of results from ED lab and/or imaging studies, admission to the hospital is not indicated at this time.    Problems Addressed: Acute thoracic back  pain, unspecified back pain laterality: acute illness or injury Left ureteral stone: undiagnosed new problem with uncertain prognosis  Amount and/or Complexity of Data Reviewed Labs: ordered. Decision-making details documented in ED Course. Radiology: ordered and independent interpretation performed. Decision-making details documented in ED Course. ECG/medicine tests: ordered and independent interpretation performed. Decision-making details documented in ED Course.  Risk Prescription drug management. Parenteral controlled substances. Decision regarding hospitalization.     Final Clinical Impression(s) / ED Diagnoses Final diagnoses:  Acute thoracic back pain, unspecified back pain laterality  Left ureteral stone    Rx / DC Orders ED Discharge Orders          Ordered    HYDROcodone-acetaminophen (NORCO/VICODIN) 5-325 MG tablet  Every 6 hours PRN        09/06/23 0543             Pollyann Savoy, MD 09/06/23 [phone]

## 2023-09-06 NOTE — Assessment & Plan Note (Signed)
Describes left sided and can radiate to left labia and back again but is tolerable and happens infrequently. Likely arthritic changes related to old trauma to hip and low back

## 2023-09-06 NOTE — Assessment & Plan Note (Signed)
Encourage heart healthy diet such as MIND or DASH diet, increase exercise, avoid trans fats, simple carbohydrates and processed foods, consider a krill or fish or flaxseed oil cap daily.  °

## 2023-09-06 NOTE — Assessment & Plan Note (Signed)
Well controlled, no changes to meds. Encouraged heart healthy diet such as the DASH diet and exercise as tolerated.  °

## 2023-09-06 NOTE — Assessment & Plan Note (Signed)
Supplement and monitor 

## 2023-09-06 NOTE — Assessment & Plan Note (Signed)
Rate controlled, tolerating meds 

## 2023-09-06 NOTE — Assessment & Plan Note (Signed)
hgba1c acceptable, minimize simple carbs. Increase exercise as tolerated.  

## 2023-09-07 ENCOUNTER — Ambulatory Visit (INDEPENDENT_AMBULATORY_CARE_PROVIDER_SITE_OTHER): Payer: Medicare Other | Admitting: Family Medicine

## 2023-09-07 ENCOUNTER — Encounter: Payer: Self-pay | Admitting: Family Medicine

## 2023-09-07 VITALS — BP 156/84 | HR 54 | Temp 98.6°F | Resp 16 | Ht 63.0 in | Wt 245.8 lb

## 2023-09-07 DIAGNOSIS — I10 Essential (primary) hypertension: Secondary | ICD-10-CM | POA: Diagnosis not present

## 2023-09-07 DIAGNOSIS — M545 Low back pain, unspecified: Secondary | ICD-10-CM

## 2023-09-07 DIAGNOSIS — E7800 Pure hypercholesterolemia, unspecified: Secondary | ICD-10-CM

## 2023-09-07 DIAGNOSIS — G8929 Other chronic pain: Secondary | ICD-10-CM | POA: Diagnosis not present

## 2023-09-07 DIAGNOSIS — I4891 Unspecified atrial fibrillation: Secondary | ICD-10-CM | POA: Diagnosis not present

## 2023-09-07 DIAGNOSIS — I159 Secondary hypertension, unspecified: Secondary | ICD-10-CM

## 2023-09-07 DIAGNOSIS — Z23 Encounter for immunization: Secondary | ICD-10-CM

## 2023-09-07 DIAGNOSIS — N259 Disorder resulting from impaired renal tubular function, unspecified: Secondary | ICD-10-CM

## 2023-09-07 DIAGNOSIS — R739 Hyperglycemia, unspecified: Secondary | ICD-10-CM

## 2023-09-07 DIAGNOSIS — E559 Vitamin D deficiency, unspecified: Secondary | ICD-10-CM

## 2023-09-07 DIAGNOSIS — R2681 Unsteadiness on feet: Secondary | ICD-10-CM

## 2023-09-07 DIAGNOSIS — M546 Pain in thoracic spine: Secondary | ICD-10-CM | POA: Insufficient documentation

## 2023-09-07 DIAGNOSIS — N2889 Other specified disorders of kidney and ureter: Secondary | ICD-10-CM | POA: Diagnosis not present

## 2023-09-07 MED ORDER — TIZANIDINE HCL 2 MG PO TABS: 1.0000 mg | ORAL_TABLET | Freq: Three times a day (TID) | ORAL | 1 refills | Status: DC | PRN

## 2023-09-07 NOTE — Patient Instructions (Addendum)
Tylenol/Acetaminophen ES, 00 mg tabs, 1-2 tabs up to three x a day, max of 3000 mg in 24 hours   Respiratory Syncitial Virus Vaccine, Arexvy Vaccine at pharmacy

## 2023-09-07 NOTE — Progress Notes (Signed)
Subjective:    Patient ID: [name], female    DOB: 1946-01-16, 77 y.o.   MRN: [id number]  Chief Complaint  Patient presents with   Follow-up    ED Follow up    HPI Discussed the use of AI scribe software for clinical note transcription with the patient, who gave verbal consent to proceed.  History of Present Illness   The patient, with a past medical history of nephrectomy and rheumatoid arthritis, presents with a chief complaint of back pain. The pain, described as aching, started on a Tuesday night and escalated throughout the day. The pain is located in the middle of the back and has been reported to worsen with movement. The patient also reports muscle pain in the upper arms, which is a new symptom.  In addition to the back pain, the patient reports memory issues following a recent bout of COVID-19. The patient feels that she is over the acute phase of the infection but continues to experience fatigue and cognitive difficulties. The patient also reports a dry throat, which she attributes to the COVID-19 infection.  The patient has a history of rheumatoid arthritis and is currently on Remicade. However, she reports that the medication has not been as effective recently, with increased joint pain over the last six weeks. The patient also has a history of nephrectomy and reports a recent ER visit due to the back pain.        Past Medical History:  Diagnosis Date   Anxiety    Arthritis    Atrial fibrillation (HCC) 2008   CAD (coronary artery disease)    Crohn's colitis (HCC)    she reports ulcerative colitis beginning in her 79's, biopsies 2008 suggest Crohn's not UC   Depression    Diverticulosis    GERD (gastroesophageal reflux disease)    Hyperlipidemia    Hypertension    IBS (irritable bowel syndrome)    Keloid of skin    Obesity    OSA (obstructive sleep apnea)    Pancreatitis    Paroxysmal atrial fibrillation (HCC)    Pseudoaneurysm of right femoral artery  (HCC)    Renal oncocytoma    s/p right nephrectomy   Rheumatoid arthritis (HCC) 05/29/2017   UC (ulcerative colitis) (HCC)     Past Surgical History:  Procedure Laterality Date   ANGIOPLASTY     BREAST BIOPSY  10/24/1994   benign lesion    Cataract surgery Left 11/13/2014   CHOLECYSTECTOMY  10/24/2006   COLONOSCOPY W/ BIOPSIES     CORONARY ANGIOPLASTY     ESOPHAGOGASTRODUODENOSCOPY     NEPHRECTOMY  10/24/2006   right secondary to oncocytoma    RIGHT FEMORAL  PSEUDOANEURYSM & RIGHT GROIN HEMATOMA EVACUATION  02/22/2007   RIGHT RENAL ARTERY REPAIR  10/24/1974   TUBAL LIGATION      Family History  Problem Relation Age of Onset   Hypertension Mother    Coronary artery disease Mother    Stroke Mother    Clotting disorder Mother    Heart disease Mother    Allergies Mother    Colitis Father    Heart disease Father    Clotting disorder Sister    Stroke Brother    Clotting disorder Brother    Cancer Daughter    Asthma Daughter    Asthma Maternal Uncle    Colon cancer Paternal Aunt    Colon polyps Neg Hx    Kidney disease Neg Hx    Stomach cancer Neg  Hx    Esophageal cancer Neg Hx     Social History   Socioeconomic History   Marital status: Married    Spouse name: Not on file   Number of children: 3   Years of education: Not on file   Highest education level: 12th grade  Occupational History   Occupation: Retired    Associate Professor: RETIRED    Comment: retired  Tobacco Use   Smoking status: Never   Smokeless tobacco: Never  Vaping Use   Vaping status: Never Used  Substance and Sexual Activity   Alcohol use: No   Drug use: No   Sexual activity: Never  Other Topics Concern   Not on file  Social History Narrative   Married since 1965 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, 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 - adults, 2 grandchildrenNo pets -Daughter has breast cancer   Social Determinants of Health   Financial Resource Strain: Low Risk  (06/06/2023)   Overall  Financial Resource Strain (CARDIA)    Difficulty of Paying Living Expenses: Not very hard  Food Insecurity: No Food Insecurity (06/06/2023)   Hunger Vital Sign    Worried About Running Out of Food in the Last Year: Never true    Ran Out of Food in the Last Year: Never true  Transportation Needs: No Transportation Needs (06/06/2023)   PRAPARE - Administrator, Civil Service (Medical): No    Lack of Transportation (Non-Medical): No  Physical Activity: Patient Declined (06/06/2023)   Exercise Vital Sign    Days of Exercise per Week: Patient declined    Minutes of Exercise per Session: Patient declined  Stress: No Stress Concern Present (04/24/2023)   [employer] of Occupational Health - Occupational Stress Questionnaire    Feeling of Stress : Not at all  Social Connections: Unknown (06/06/2023)   Social Connection and Isolation Panel [NHANES]    Frequency of Communication with Friends and Family: More than three times a week    Frequency of Social Gatherings with Friends and Family: More than three times a week    Attends Religious Services: Patient declined    Database administrator or Organizations: Yes    Attends Banker Meetings: 1 to 4 times per year    Marital Status: Married  Catering manager Violence: Not At Risk (06/07/2023)   Humiliation, Afraid, Rape, and Kick questionnaire    Fear of Current or Ex-Partner: No    Emotionally Abused: No    Physically Abused: No    Sexually Abused: No    Outpatient Medications Prior to Visit  Medication Sig Dispense Refill   amLODipine (NORVASC) 10 MG tablet TAKE 1 TABLET BY MOUTH EVERY DAY 90 tablet 2   ciclopirox (PENLAC) 8 % solution Apply topically at bedtime. Apply over nail and surrounding skin. Apply daily over previous coat. After seven (7) days, may remove with alcohol and continue cycle. 6.6 mL 0   dextrose 5 % SOLN 50 mL with methotrexate 25 MG/ML (PF) SOLN 40 mg/m2 Inject 40 mg/m2 into the vein once a  week. Every wednesday     dofetilide (TIKOSYN) 125 MCG capsule TAKE 3 CAPSULES BY MOUTH TWICE A DAY 540 capsule 1   doxazosin (CARDURA) 2 MG tablet TAKE 1 TABLET BY MOUTH EVERY DAY 90 tablet 2   escitalopram (LEXAPRO) 10 MG tablet TAKE 1 TABLET BY MOUTH EVERY DAY 90 tablet 1   esomeprazole (NEXIUM) 40 MG capsule Take 1 capsule (40 mg total) by mouth daily. Take  in the morning before breakfast. 30 capsule 11   folic acid (FOLVITE) 1 MG tablet Take 2 mg by mouth daily.     HYDROcodone-acetaminophen (NORCO/VICODIN) 5-325 MG tablet Take 1 tablet by mouth every 6 (six) hours as needed for severe pain (pain score 7-10). 12 tablet 0   inFLIXimab 10 mg/kg in sodium chloride 0.9 % Inject 10 mg/kg into the vein every 6 (six) weeks.     loperamide (IMODIUM A-D) 2 MG tablet Take 1 mg by mouth as needed. Dihrrhea     metoprolol tartrate (LOPRESSOR) 25 MG tablet TAKE 1 TABLET (25 MG) BY MOUTH IN THE MORNING AND AT BEDTIME 180 tablet 3   Multiple Vitamin (MULTIVITAMIN) capsule Take 1 capsule by mouth daily. 1 daily     nitroGLYCERIN (NITROSTAT) 0.4 MG SL tablet Place 1 tablet (0.4 mg total) under the tongue every 5 (five) minutes as needed for chest pain. Do not exceed 3 tabs in 15 minutes 75 tablet 3   Probiotic Product (PHILLIPS COLON HEALTH) CAPS Take 1 capsule by mouth daily. 1 per day     TUBERCULIN SYR 1CC/27GX1/2" 27G X 1/2" 1 ML MISC      valsartan (DIOVAN) 160 MG tablet TAKE 1 TABLET BY MOUTH EVERY DAY 90 tablet 3   warfarin (COUMADIN) 2.5 MG tablet TAKE 1/2 TABLET TO 1 TABLET BY MOUTH DAILY AS DIRECTED BY COUMADIN CLINIC 75 tablet 1   No facility-administered medications prior to visit.    Allergies  Allergen Reactions   Hydralazine Diarrhea and Nausea Only    Anorexia, upset stomach, burning pain in abdomin   Cefdinir     Pt cannot take; may interfere with AFib.   Codeine     Heart beats fast    Fosamax [Alendronate Sodium] Other (See Comments)    GI upset   Guaifenesin Er Nausea And  Vomiting and Other (See Comments)    Headaches; can tolerate liquid.   Latex Other (See Comments)    Breathing problems   Percocet [Oxycodone-Acetaminophen]     Itching & swelling in jaw   Psyllium Other (See Comments)    Review of Systems  Constitutional:  Positive for malaise/fatigue. Negative for fever.  HENT:  Negative for congestion.   Eyes:  Negative for blurred vision.  Respiratory:  Negative for shortness of breath.   Cardiovascular:  Negative for chest pain, palpitations and leg swelling.  Gastrointestinal:  Positive for abdominal pain. Negative for blood in stool and nausea.  Genitourinary:  Negative for dysuria and frequency.  Musculoskeletal:  Positive for back pain, joint pain and myalgias. Negative for falls.  Skin:  Negative for rash.  Neurological:  Negative for dizziness, loss of consciousness and headaches.  Endo/Heme/Allergies:  Negative for environmental allergies.  Psychiatric/Behavioral:  Positive for memory loss. Negative for depression. The patient is not nervous/anxious.        Objective:    Physical Exam Constitutional:      General: She is not in acute distress.    Appearance: Normal appearance. She is well-developed. She is not toxic-appearing.  HENT:     Head: Normocephalic and atraumatic.     Right Ear: External ear normal.     Left Ear: External ear normal.     Nose: Nose normal.  Eyes:     General:        Right eye: No discharge.        Left eye: No discharge.     Conjunctiva/sclera: Conjunctivae normal.  Neck:  Thyroid: No thyromegaly.  Cardiovascular:     Rate and Rhythm: Normal rate and regular rhythm.     Heart sounds: Normal heart sounds. No murmur heard. Pulmonary:     Effort: Pulmonary effort is normal. No respiratory distress.     Breath sounds: Normal breath sounds.  Abdominal:     General: Bowel sounds are normal.     Palpations: Abdomen is soft.     Tenderness: There is no abdominal tenderness. There is no guarding.   Musculoskeletal:        General: Normal range of motion.     Cervical back: Neck supple.  Lymphadenopathy:     Cervical: No cervical adenopathy.  Skin:    General: Skin is warm and dry.  Neurological:     Mental Status: She is alert and oriented to person, place, and time.  Psychiatric:        Mood and Affect: Mood normal.        Behavior: Behavior normal.        Thought Content: Thought content normal.        Judgment: Judgment normal.     BP (!) 156/84 (BP Location: Left Arm, Patient Position: Sitting, Cuff Size: Large)   Pulse (!) 54   Temp 98.6 F (37 C) (Oral)   Resp 16   Ht 5\' 3"  (1.6 m)   Wt 245 lb 12.8 oz (111.5 kg)   SpO2 97%   BMI 43.54 kg/m  Wt Readings from Last 3 Encounters:  09/07/23 245 lb 12.8 oz (111.5 kg)  09/05/23 244 lb (110.7 kg)  08/28/23 245 lb (111.1 kg)    Diabetic Foot Exam - Simple   No data filed    Lab Results  Component Value Date   WBC 8.5 09/06/2023   HGB 12.5 09/06/2023   HCT 38.1 09/06/2023   PLT 227 09/06/2023   GLUCOSE 102 (H) 09/06/2023   CHOL 162 04/25/2023   TRIG 123.0 04/25/2023   HDL 49.70 04/25/2023   LDLCALC 88 04/25/2023   ALT 14 04/25/2023   AST 19 04/25/2023   NA 136 09/06/2023   K 3.7 09/06/2023   CL 103 09/06/2023   CREATININE 1.24 (H) 09/06/2023   BUN 18 09/06/2023   CO2 26 09/06/2023   TSH 4.49 04/25/2023   INR 2.0 (H) 09/06/2023   HGBA1C 5.6 04/25/2023   MICROALBUR 3.8 (H) 04/25/2023    Lab Results  Component Value Date   TSH 4.49 04/25/2023   Lab Results  Component Value Date   WBC 8.5 09/06/2023   HGB 12.5 09/06/2023   HCT 38.1 09/06/2023   MCV 98.4 09/06/2023   PLT 227 09/06/2023   Lab Results  Component Value Date   NA 136 09/06/2023   K 3.7 09/06/2023   CO2 26 09/06/2023   GLUCOSE 102 (H) 09/06/2023   BUN 18 09/06/2023   CREATININE 1.24 (H) 09/06/2023   BILITOT 0.6 04/25/2023   ALKPHOS 83 04/25/2023   AST 19 04/25/2023   ALT 14 04/25/2023   PROT 7.4 04/25/2023   ALBUMIN 4.0  04/25/2023   CALCIUM 8.7 (L) 09/06/2023   ANIONGAP 7 09/06/2023   EGFR 52 (L) 12/28/2022   GFR 50.04 (L) 04/25/2023   Lab Results  Component Value Date   CHOL 162 04/25/2023   Lab Results  Component Value Date   HDL 49.70 04/25/2023   Lab Results  Component Value Date   LDLCALC 88 04/25/2023   Lab Results  Component Value Date   TRIG  123.0 04/25/2023   Lab Results  Component Value Date   CHOLHDL 3 04/25/2023   Lab Results  Component Value Date   HGBA1C 5.6 04/25/2023       Assessment & Plan:  Atrial fibrillation, unspecified type (HCC) Assessment & Plan: Rate controlled, tolerating meds   Essential hypertension Assessment & Plan: Well controlled, no changes to meds. Encouraged heart healthy diet such as the DASH diet and exercise as tolerated.    Hyperglycemia Assessment & Plan: hgba1c acceptable, minimize simple carbs. Increase exercise as tolerated.    Pure hypercholesterolemia Assessment & Plan: Encourage heart healthy diet such as MIND or DASH diet, increase exercise, avoid trans fats, simple carbohydrates and processed foods, consider a krill or fish or flaxseed oil cap daily.    Secondary hypertension Assessment & Plan: Well controlled, no changes to meds. Encouraged heart healthy diet such as the DASH diet and exercise as tolerated.     Chronic left-sided low back pain, unspecified whether sciatica present Assessment & Plan: Describes left sided and can radiate to left labia and back again but is tolerable and happens infrequently. Likely arthritic changes related to old trauma to hip and low back    Vitamin D deficiency Assessment & Plan: Supplement and monitor   Acute midline thoracic back pain Assessment & Plan: Set up with home PT, given Tizanidine to use prn and use Tylenol 500 mg po tid  Orders: -     Ambulatory referral to Home Health  Unsteady gait -     Ambulatory referral to Home Health  Other specified disorders of  kidney and ureter -     MR ABDOMEN W WO CONTRAST; Future  Need for influenza vaccination -     Flu Vaccine Trivalent High Dose (Fluad)  Disorder resulting from impaired renal function Assessment & Plan: Renal abnormality seen on recent CT scan. Now will order MR    Other orders -     tiZANidine HCl; Take 0.5-1 tablets (1-2 mg total) by mouth every 8 (eight) hours as needed for muscle spasms.  Dispense: 40 tablet; Refill: 1    Assessment and Plan    Back Pain Mid-back aching pain, possibly radiating to left shoulder. No associated symptoms suggestive of cardiac origin. Recent ER visit for this issue. -Consider physical therapy for management of back pain. -Consider referral to sports medicine or orthopedics if pain persists or worsens. -Home physical therapy and occupational therapy for fall risk reduction and home adjustments.  Rheumatoid Arthritis Increased joint pain, possibly indicative of psoriatic arthritis. Currently on Remicade. -Continue Remicade as scheduled. -Consider discussing with rheumatologist if pain continues to escalate.  Post-COVID Syndrome Reports of fatigue, memory issues, and headaches following COVID-19 infection. -Encourage hydration, clean eating, and gradual increase in physical activity.  Hyperglycemia Mildly elevated HbA1c over the past 5 years. -Continue monitoring carbohydrate intake and physical activity.  Vitamin D Deficiency Low-normal Vitamin D levels in July. -Resume Vitamin D supplementation at 2000 units daily. -Check Vitamin D levels in February.  Muscle Pain New onset muscle pain in upper arms. -Prescribe Tizanidine 2mg  tablet, half to one tablet up to three times a day as needed.  Dry Mouth Likely secondary to recent illness and mouth breathing. -Encourage increased hydration.  Liver Lesion/Renal Mass Indeterminate findings on recent imaging. -Order abdominal MRI with and without IV gadolinium. -Follow up with  urology.  General Health Maintenance -Consider COVID booster in the fall. -Consider RSV vaccine when available.  Danise Edge, MD

## 2023-09-07 NOTE — Assessment & Plan Note (Signed)
Set up with home PT, given Tizanidine to use prn and use Tylenol 500 mg po tid

## 2023-09-07 NOTE — Assessment & Plan Note (Signed)
Renal abnormality seen on recent CT scan. Now will order MR

## 2023-09-10 ENCOUNTER — Other Ambulatory Visit: Payer: Self-pay

## 2023-09-10 ENCOUNTER — Emergency Department (HOSPITAL_BASED_OUTPATIENT_CLINIC_OR_DEPARTMENT_OTHER): Payer: Medicare Other

## 2023-09-10 ENCOUNTER — Observation Stay (HOSPITAL_BASED_OUTPATIENT_CLINIC_OR_DEPARTMENT_OTHER)
Admission: EM | Admit: 2023-09-10 | Discharge: 2023-09-13 | Disposition: A | Discharge status: Home / Self Care | Payer: Medicare Other | Attending: Internal Medicine | Admitting: Internal Medicine

## 2023-09-10 DIAGNOSIS — K5730 Diverticulosis of large intestine without perforation or abscess without bleeding: Secondary | ICD-10-CM | POA: Diagnosis not present

## 2023-09-10 DIAGNOSIS — N838 Other noninflammatory disorders of ovary, fallopian tube and broad ligament: Secondary | ICD-10-CM

## 2023-09-10 DIAGNOSIS — R1032 Left lower quadrant pain: Secondary | ICD-10-CM

## 2023-09-10 DIAGNOSIS — I480 Paroxysmal atrial fibrillation: Secondary | ICD-10-CM | POA: Diagnosis not present

## 2023-09-10 DIAGNOSIS — R7989 Other specified abnormal findings of blood chemistry: Secondary | ICD-10-CM

## 2023-09-10 DIAGNOSIS — Z91040 Latex allergy status: Secondary | ICD-10-CM | POA: Insufficient documentation

## 2023-09-10 DIAGNOSIS — I129 Hypertensive chronic kidney disease with stage 1 through stage 4 chronic kidney disease, or unspecified chronic kidney disease: Secondary | ICD-10-CM | POA: Diagnosis not present

## 2023-09-10 DIAGNOSIS — I4891 Unspecified atrial fibrillation: Secondary | ICD-10-CM | POA: Diagnosis present

## 2023-09-10 DIAGNOSIS — Z6841 Body Mass Index (BMI) 40.0 and over, adult: Secondary | ICD-10-CM | POA: Diagnosis not present

## 2023-09-10 DIAGNOSIS — E669 Obesity, unspecified: Secondary | ICD-10-CM | POA: Diagnosis present

## 2023-09-10 DIAGNOSIS — Z955 Presence of coronary angioplasty implant and graft: Secondary | ICD-10-CM | POA: Diagnosis not present

## 2023-09-10 DIAGNOSIS — N83209 Unspecified ovarian cyst, unspecified side: Secondary | ICD-10-CM | POA: Insufficient documentation

## 2023-09-10 DIAGNOSIS — R0789 Other chest pain: Secondary | ICD-10-CM | POA: Diagnosis not present

## 2023-09-10 DIAGNOSIS — I878 Other specified disorders of veins: Secondary | ICD-10-CM | POA: Insufficient documentation

## 2023-09-10 DIAGNOSIS — N182 Chronic kidney disease, stage 2 (mild): Secondary | ICD-10-CM | POA: Diagnosis not present

## 2023-09-10 DIAGNOSIS — Z7901 Long term (current) use of anticoagulants: Secondary | ICD-10-CM | POA: Diagnosis not present

## 2023-09-10 DIAGNOSIS — R778 Other specified abnormalities of plasma proteins: Secondary | ICD-10-CM | POA: Diagnosis not present

## 2023-09-10 DIAGNOSIS — M069 Rheumatoid arthritis, unspecified: Secondary | ICD-10-CM | POA: Diagnosis present

## 2023-09-10 DIAGNOSIS — R079 Chest pain, unspecified: Secondary | ICD-10-CM | POA: Diagnosis not present

## 2023-09-10 DIAGNOSIS — I2510 Atherosclerotic heart disease of native coronary artery without angina pectoris: Secondary | ICD-10-CM | POA: Diagnosis not present

## 2023-09-10 DIAGNOSIS — J9811 Atelectasis: Secondary | ICD-10-CM | POA: Diagnosis not present

## 2023-09-10 DIAGNOSIS — Z9049 Acquired absence of other specified parts of digestive tract: Secondary | ICD-10-CM | POA: Diagnosis not present

## 2023-09-10 DIAGNOSIS — Z79899 Other long term (current) drug therapy: Secondary | ICD-10-CM | POA: Insufficient documentation

## 2023-09-10 DIAGNOSIS — E66813 Obesity, class 3: Secondary | ICD-10-CM | POA: Diagnosis present

## 2023-09-10 LAB — CBC WITH DIFFERENTIAL/PLATELET
Abs Immature Granulocytes: 0.04 10*3/uL (ref 0.00–0.07)
Basophils Absolute: 0 10*3/uL (ref 0.0–0.1)
Basophils Relative: 0 %
Eosinophils Absolute: 0.1 10*3/uL (ref 0.0–0.5)
Eosinophils Relative: 1 %
HCT: 38.7 % (ref 36.0–46.0)
Hemoglobin: 12.4 g/dL (ref 12.0–15.0)
Immature Granulocytes: 0 %
Lymphocytes Relative: 14 %
Lymphs Abs: 1.4 10*3/uL (ref 0.7–4.0)
MCH: 31.6 pg (ref 26.0–34.0)
MCHC: 32 g/dL (ref 30.0–36.0)
MCV: 98.5 fL (ref 80.0–100.0)
Monocytes Absolute: 0.9 10*3/uL (ref 0.1–1.0)
Monocytes Relative: 9 %
Neutro Abs: 7.6 10*3/uL (ref 1.7–7.7)
Neutrophils Relative %: 76 %
Platelets: 219 10*3/uL (ref 150–400)
RBC: 3.93 MIL/uL (ref 3.87–5.11)
RDW: 16.5 % — ABNORMAL HIGH (ref 11.5–15.5)
WBC: 10 10*3/uL (ref 4.0–10.5)
nRBC: 0 % (ref 0.0–0.2)

## 2023-09-10 LAB — LACTIC ACID, PLASMA: Lactic Acid, Venous: 1.1 mmol/L (ref 0.5–1.9)

## 2023-09-10 LAB — COMPREHENSIVE METABOLIC PANEL
ALT: 20 U/L (ref 0–44)
AST: 25 U/L (ref 15–41)
Albumin: 3.5 g/dL (ref 3.5–5.0)
Alkaline Phosphatase: 83 U/L (ref 38–126)
Anion gap: 9 (ref 5–15)
BUN: 13 mg/dL (ref 8–23)
CO2: 23 mmol/L (ref 22–32)
Calcium: 8.8 mg/dL — ABNORMAL LOW (ref 8.9–10.3)
Chloride: 105 mmol/L (ref 98–111)
Creatinine, Ser: 1.15 mg/dL — ABNORMAL HIGH (ref 0.44–1.00)
GFR, Estimated: 49 mL/min — ABNORMAL LOW (ref >60)
Glucose, Bld: 105 mg/dL — ABNORMAL HIGH (ref 70–99)
Potassium: 4.1 mmol/L (ref 3.5–5.1)
Sodium: 137 mmol/L (ref 135–145)
Total Bilirubin: 0.7 mg/dL (ref <1.2)
Total Protein: 7.4 g/dL (ref 6.5–8.1)

## 2023-09-10 LAB — URINALYSIS, ROUTINE W REFLEX MICROSCOPIC
Bilirubin Urine: NEGATIVE
Glucose, UA: NEGATIVE mg/dL
Ketones, ur: NEGATIVE mg/dL
Leukocytes,Ua: NEGATIVE
Nitrite: NEGATIVE
Protein, ur: NEGATIVE mg/dL
Specific Gravity, Urine: 1.01 (ref 1.005–1.030)
pH: 6 (ref 5.0–8.0)

## 2023-09-10 LAB — URINALYSIS, MICROSCOPIC (REFLEX)

## 2023-09-10 LAB — TROPONIN I (HIGH SENSITIVITY): Troponin I (High Sensitivity): 21 ng/L — ABNORMAL HIGH (ref <18)

## 2023-09-10 LAB — PROTIME-INR
INR: 2.5 — ABNORMAL HIGH (ref 0.8–1.2)
Prothrombin Time: 27.6 s — ABNORMAL HIGH (ref 11.4–15.2)

## 2023-09-10 MED ORDER — LIDOCAINE 5 % EX PTCH
1.0000 | MEDICATED_PATCH | CUTANEOUS | Status: DC
  Administered 2023-09-10 – 2023-09-12 (×3): 1 via TRANSDERMAL
  Filled 2023-09-10 (×3): qty 1

## 2023-09-10 MED ORDER — IOHEXOL 300 MG/ML  SOLN
100.0000 mL | Freq: Once | INTRAMUSCULAR | Status: AC | PRN
  Administered 2023-09-10: 100 mL via INTRAVENOUS

## 2023-09-10 MED ORDER — FENTANYL CITRATE PF 50 MCG/ML IJ SOSY
50.0000 ug | PREFILLED_SYRINGE | INTRAMUSCULAR | Status: DC | PRN
  Filled 2023-09-10: qty 1

## 2023-09-10 NOTE — ED Notes (Signed)
Attempted IV x 2 unsuccessful.

## 2023-09-10 NOTE — ED Triage Notes (Signed)
Pt returns for continued middle back pain that has now progressed to LUE and LT CP (pressure) x a few days

## 2023-09-10 NOTE — ED Provider Notes (Signed)
[name] Provider Note   CSN: [id number] Arrival date & time: 09/10/23  1849     History  Chief Complaint  Patient presents with   Chest Pain    [name] is a 77 y.o. female.   Chest Pain Associated symptoms: abdominal pain      77 year old female with medical history significant for rheumatoid arthritis, atrial fibrillation on Coumadin, CAD, Crohn's disease, diverticulosis, HLD, HTN, IBS, obesity presenting to the emergency department with 2 complaints.  1) the patient states that she has had new onset left lower quadrant abdominal pain, no radiation, moderate in severity.  No fevers or chills.  She is moving her bowels and is passing gas.  She also states that 2) has had persistent left-sided chest pain.  She had been seen in the emergency department on 09/06/2023 and had a negative CTA dissection study and negative labs.  She follow-up outpatient with her PCP and there is also low concern for cardiac etiology.  She endorses reproducible musculoskeletal pain, worse with movement and twisting on the left side of her chest, slight radiation up to the neck and left arm.  Also has left sided back pain.  She denies any cough, denies any dyspnea.  There is a pleuritic component where she has worsening pain with expansion of the left chest wall.  Pain and discomfort has been persistent for the last 5 days.  Home Medications Prior to Admission medications   Medication Sig Start Date End Date Taking? Authorizing Provider  amLODipine (NORVASC) 10 MG tablet TAKE 1 TABLET BY MOUTH EVERY DAY 02/16/23   Lewayne Bunting, MD  ciclopirox [facility]) 8 % solution Apply topically at bedtime. Apply over nail and surrounding skin. Apply daily over previous coat. After seven (7) days, may remove with alcohol and continue cycle. 08/29/23   Standiford, Jenelle Mages, DPM  dextrose 5 % SOLN 50 mL with methotrexate 25 MG/ML (PF) SOLN 40 mg/m2 Inject 40 mg/m2  into the vein once a week. Every wednesday    [provider]  dofetilide (TIKOSYN) 125 MCG capsule TAKE 3 CAPSULES BY MOUTH TWICE A DAY 05/22/23   Lewayne Bunting, MD  doxazosin (CARDURA) 2 MG tablet TAKE 1 TABLET BY MOUTH EVERY DAY 02/16/23   Lewayne Bunting, MD  escitalopram (LEXAPRO) 10 MG tablet TAKE 1 TABLET BY MOUTH EVERY DAY 08/04/23   Bradd Canary, MD  esomeprazole (NEXIUM) 40 MG capsule Take 1 capsule (40 mg total) by mouth daily. Take in the morning before breakfast. 01/15/18   Esterwood, Amy S, PA-C  folic acid (FOLVITE) 1 MG tablet Take 2 mg by mouth daily. 09/25/19   [provider]  HYDROcodone-acetaminophen (NORCO/VICODIN) 5-325 MG tablet Take 1 tablet by mouth every 6 (six) hours as needed for severe pain (pain score 7-10). 09/06/23   Pollyann Savoy, MD  inFLIXimab 10 mg/kg in sodium chloride 0.9 % Inject 10 mg/kg into the vein every 6 (six) weeks.    [provider]  loperamide (IMODIUM A-D) 2 MG tablet Take 1 mg by mouth as needed. Dihrrhea    [provider]  metoprolol tartrate (LOPRESSOR) 25 MG tablet TAKE 1 TABLET (25 MG) BY MOUTH IN THE MORNING AND AT BEDTIME 02/03/23   Lewayne Bunting, MD  Multiple Vitamin (MULTIVITAMIN) capsule Take 1 capsule by mouth daily. 1 daily    [provider]  nitroGLYCERIN (NITROSTAT) 0.4 MG SL tablet Place 1 tablet (0.4 mg total)  under the tongue every 5 (five) minutes as needed for chest pain. Do not exceed 3 tabs in 15 minutes 08/20/18   Lewayne Bunting, MD  Probiotic Product [facility]ON [facility]ALTH) CAPS Take 1 capsule by mouth daily. 1 per day    [provider]  tiZANidine (ZANAFLEX) 2 MG tablet Take 0.5-1 tablets (1-2 mg total) by mouth every 8 (eight) hours as needed for muscle spasms. 09/07/23   Bradd Canary, MD  TUBERCULIN SYR 1CC/27GX1/2" 27G X 1/2" 1 ML MISC     [provider]  valsartan (DIOVAN) 160 MG tablet TAKE 1 TABLET BY MOUTH EVERY DAY 05/01/23   Lewayne Bunting, MD  warfarin (COUMADIN) 2.5 MG tablet TAKE 1/2 TABLET TO 1 TABLET BY MOUTH DAILY AS DIRECTED BY COUMADIN CLINIC 08/24/23   Lewayne Bunting, MD      Allergies    Hydralazine, Cefdinir, Codeine, Fosamax [alendronate sodium], Guaifenesin er, Latex, Percocet [oxycodone-acetaminophen], and Psyllium    Review of Systems   Review of Systems  Cardiovascular:  Positive for chest pain.  Gastrointestinal:  Positive for abdominal pain.  All other systems reviewed and are negative.   Physical Exam Updated Vital Signs BP (!) 173/76   Pulse 69   Temp 98.3 F (36.8 C) (Oral)   Resp 20   Ht 5\' 3"  (1.6 m)   Wt 111.5 kg   SpO2 96%   BMI 43.54 kg/m  Physical Exam Vitals and nursing note reviewed. Exam conducted with a chaperone present.  Constitutional:      General: She is not in acute distress.    Appearance: She is well-developed. She is obese.  HENT:     Head: Normocephalic and atraumatic.  Eyes:     Conjunctiva/sclera: Conjunctivae normal.  Cardiovascular:     Rate and Rhythm: Normal rate and regular rhythm.  Pulmonary:     Effort: Pulmonary effort is normal. No respiratory distress.     Breath sounds: Normal breath sounds.  Chest:     Comments: left-sided chest wall tenderness to palpation that reproduces the patient's pain Abdominal:     Palpations: Abdomen is soft.     Tenderness: There is abdominal tenderness in the left lower quadrant. There is no guarding or rebound.  Musculoskeletal:        General: No swelling.     Cervical back: Neck supple.  Skin:    General: Skin is warm and dry.     Capillary Refill: Capillary refill takes less than 2 seconds.  Neurological:     Mental Status: She is alert.  Psychiatric:        Mood and Affect: Mood normal.     ED Results / Procedures / Treatments   Labs (all labs ordered are listed, but only abnormal results are displayed) Labs Reviewed  CBC WITH DIFFERENTIAL/PLATELET - Abnormal; Notable for the following  components:      Result Value   RDW 16.5 (*)    All other components within normal limits  COMPREHENSIVE METABOLIC PANEL - Abnormal; Notable for the following components:   Glucose, Bld 105 (*)    Creatinine, Ser 1.15 (*)    Calcium 8.8 (*)    GFR, Estimated 49 (*)    All other components within normal limits  PROTIME-INR - Abnormal; Notable for the following components:   Prothrombin Time 27.6 (*)    INR 2.5 (*)    All other components within normal limits  URINALYSIS, ROUTINE W REFLEX MICROSCOPIC - Abnormal; Notable  for the following components:   Hgb urine dipstick TRACE (*)    All other components within normal limits  URINALYSIS, MICROSCOPIC (REFLEX) - Abnormal; Notable for the following components:   Bacteria, UA RARE (*)    All other components within normal limits  TROPONIN I (HIGH SENSITIVITY) - Abnormal; Notable for the following components:   Troponin I (High Sensitivity) 21 (*)    All other components within normal limits  LACTIC ACID, PLASMA  TROPONIN I (HIGH SENSITIVITY)    EKG None  Radiology No results found.  Procedures Procedures    Medications Ordered in ED Medications  fentaNYL (SUBLIMAZE) injection 50 mcg (has no administration in time range)  lidocaine (LIDODERM) 5 % 1 patch (1 patch Transdermal Patch Applied 09/10/23 2137)  iohexol (OMNIPAQUE) 300 MG/ML solution 100 mL (has no administration in time range)    ED Course/ Medical Decision Making/ A&P                                 Medical Decision Making Amount and/or Complexity of Data Reviewed Labs: ordered. Radiology: ordered.  Risk Prescription drug management.    77 year old female with medical history significant for rheumatoid arthritis, atrial fibrillation on Coumadin, CAD, Crohn's disease, diverticulosis, HLD, HTN, IBS, obesity presenting to the emergency department with 2 complaints.  1) the patient states that she has had new onset left lower quadrant abdominal pain, no  radiation, moderate in severity.  No fevers or chills.  She is moving her bowels and is passing gas.  She also states that 2) has had persistent left-sided chest pain.  She had been seen in the emergency department on 09/06/2023 and had a negative CTA dissection study and negative labs.  She follow-up outpatient with her PCP and there is also low concern for cardiac etiology.  She endorses reproducible musculoskeletal pain, worse with movement and twisting on the left side of her chest, slight radiation up to the neck and left arm.  Also has left sided back pain.  She denies any cough, denies any dyspnea.  There is a pleuritic component where she has worsening pain with expansion of the left chest wall.  Pain and discomfort has been persistent for the last 5 days.    On arrival, the patient was afebrile, not tachycardic or tachypneic, hypertensive BP 176/75, saturating 97% on room air.  Physical exam revealed left lower quadrant tenderness to palpation, no rebound or guarding, reproducible left-sided chest wall tenderness.  Differential diagnosis includes musculoskeletal pain, less likely ACS.  Dissection previously had been ruled out pain has been persistent for the last few days.  The patient is also anticoagulated, low concern for aortic dissection, low concern for PE.  Patient without cough, low concern for pneumonia, viral infection.  Considered diverticulosis/diverticulitis, considered nephrolithiasis, considered pyelonephritis/UTI.  EKG: Sinus rhythm, ventricular rate 65, no acute ischemic changes, no significant changes compared to previous tracing, QTc normal  CXR: pending  CT Abdomen Pelvis: pending  Labs: CBC without a leukocytosis or anemia, urinalysis with trace hemoglobin, negative for UTI, INR therapeutic at 2.5, lactic acid normal at 1.1, CMP generally unremarkable with no electrolyte abnormality, normal renal and liver function, initial cardiac troponin elevated at 21, repeat  pending.  Patient chest x-ray and CT imaging pending.  Suspect likely musculoskeletal pain as etiology of the patient's chest wall discomfort.  No rash to indicate shingles.  Will obtain delta troponin given initial elevated troponin  at 21 however symptoms of ongoing for 5 days, lower concern for ACS.  Previous dissection have been ruled out with similar symptoms on presentation 5 days ago.  Where the patient was abdominal pain, will obtain CT of the abdomen pelvis to further evaluate for possible diverticulitis versus other acute intra-abdominal abnormality.  Signout given to Dr. Pilar Plate pending results of remaining diagnostic testing and reassessment.   Final Clinical Impression(s) / ED Diagnoses Final diagnoses:  Chest wall pain  Left lower quadrant abdominal pain    Rx / DC Orders ED Discharge Orders     None         Ernie Avena, MD 09/10/23 2309

## 2023-09-10 NOTE — ED Notes (Signed)
Pt requested to take at-home meds, EDP notified and approved

## 2023-09-10 NOTE — ED Provider Notes (Signed)
  Provider Note MRN:  [id number]  Arrival date & time: 09/11/23    ED Course and Medical Decision Making  Assumed care from Dr. Karene Fry at shift change.  LLQ pain also chest pain, recent evaluation with normal dissection study, non emergent kidney finding.  Awaiting CT, delta trop.  1 AM update: Troponin slightly rising, patient still having some occasional chest pain, will admit to medicine.  .Critical Care  Performed by: Sabas Sous, MD Authorized by: Sabas Sous, MD   Critical care provider statement:    Critical care time (minutes):  30   Critical care was necessary to treat or prevent imminent or life-threatening deterioration of the following conditions: Chest pain with elevated troponin, possible ACS.   Critical care was time spent personally by me on the following activities:  Development of treatment plan with patient or surrogate, discussions with consultants, evaluation of patient's response to treatment, examination of patient, ordering and review of laboratory studies, ordering and review of radiographic studies, ordering and performing treatments and interventions, pulse oximetry, re-evaluation of patient's condition and review of old charts   Final Clinical Impressions(s) / ED Diagnoses     ICD-10-CM   1. Chest wall pain  R07.89     2. Left lower quadrant abdominal pain  R10.32     3. Elevated troponin  R79.89       ED Discharge Orders     None       Discharge Instructions   None     Elmer Sow. Pilar Plate, MD [facility] [email or web address]    Sabas Sous, MD 09/11/23 [phone]

## 2023-09-11 ENCOUNTER — Encounter (HOSPITAL_COMMUNITY): Payer: Self-pay | Admitting: Internal Medicine

## 2023-09-11 DIAGNOSIS — I2510 Atherosclerotic heart disease of native coronary artery without angina pectoris: Secondary | ICD-10-CM | POA: Diagnosis not present

## 2023-09-11 DIAGNOSIS — I878 Other specified disorders of veins: Secondary | ICD-10-CM | POA: Diagnosis not present

## 2023-09-11 DIAGNOSIS — R079 Chest pain, unspecified: Secondary | ICD-10-CM | POA: Diagnosis not present

## 2023-09-11 DIAGNOSIS — N83209 Unspecified ovarian cyst, unspecified side: Secondary | ICD-10-CM | POA: Diagnosis not present

## 2023-09-11 DIAGNOSIS — R0789 Other chest pain: Secondary | ICD-10-CM | POA: Diagnosis not present

## 2023-09-11 DIAGNOSIS — Z91040 Latex allergy status: Secondary | ICD-10-CM | POA: Diagnosis not present

## 2023-09-11 DIAGNOSIS — Z955 Presence of coronary angioplasty implant and graft: Secondary | ICD-10-CM | POA: Diagnosis not present

## 2023-09-11 DIAGNOSIS — Z79899 Other long term (current) drug therapy: Secondary | ICD-10-CM | POA: Diagnosis not present

## 2023-09-11 DIAGNOSIS — I480 Paroxysmal atrial fibrillation: Secondary | ICD-10-CM | POA: Diagnosis not present

## 2023-09-11 DIAGNOSIS — N182 Chronic kidney disease, stage 2 (mild): Secondary | ICD-10-CM | POA: Diagnosis not present

## 2023-09-11 DIAGNOSIS — Z6841 Body Mass Index (BMI) 40.0 and over, adult: Secondary | ICD-10-CM | POA: Diagnosis not present

## 2023-09-11 DIAGNOSIS — I129 Hypertensive chronic kidney disease with stage 1 through stage 4 chronic kidney disease, or unspecified chronic kidney disease: Secondary | ICD-10-CM | POA: Diagnosis not present

## 2023-09-11 DIAGNOSIS — Z7901 Long term (current) use of anticoagulants: Secondary | ICD-10-CM | POA: Diagnosis not present

## 2023-09-11 DIAGNOSIS — N838 Other noninflammatory disorders of ovary, fallopian tube and broad ligament: Secondary | ICD-10-CM

## 2023-09-11 LAB — D-DIMER, QUANTITATIVE: D-Dimer, Quant: <0.27 ug{FEU}/mL (ref 0.00–0.50)

## 2023-09-11 LAB — TROPONIN I (HIGH SENSITIVITY)
Troponin I (High Sensitivity): 21 ng/L — ABNORMAL HIGH (ref <18)
Troponin I (High Sensitivity): 25 ng/L — ABNORMAL HIGH (ref <18)

## 2023-09-11 LAB — PROTIME-INR
INR: 2.6 — ABNORMAL HIGH (ref 0.8–1.2)
Prothrombin Time: 27.7 s — ABNORMAL HIGH (ref 11.4–15.2)

## 2023-09-11 MED ORDER — ATORVASTATIN CALCIUM 40 MG PO TABS
40.0000 mg | ORAL_TABLET | Freq: Every day | ORAL | Status: DC
  Administered 2023-09-12 – 2023-09-13 (×2): 40 mg via ORAL
  Filled 2023-09-11 (×2): qty 1

## 2023-09-11 MED ORDER — HYDRALAZINE HCL 20 MG/ML IJ SOLN
10.0000 mg | INTRAMUSCULAR | Status: DC | PRN
  Administered 2023-09-13: 10 mg via INTRAVENOUS
  Filled 2023-09-11 (×2): qty 1

## 2023-09-11 MED ORDER — DOXAZOSIN MESYLATE 2 MG PO TABS
2.0000 mg | ORAL_TABLET | Freq: Every day | ORAL | Status: DC
  Administered 2023-09-11 – 2023-09-12 (×2): 2 mg via ORAL
  Filled 2023-09-11 (×2): qty 1

## 2023-09-11 MED ORDER — METOPROLOL TARTRATE 25 MG PO TABS
25.0000 mg | ORAL_TABLET | Freq: Two times a day (BID) | ORAL | Status: DC
  Administered 2023-09-11 – 2023-09-13 (×4): 25 mg via ORAL
  Filled 2023-09-11 (×4): qty 1

## 2023-09-11 MED ORDER — LABETALOL HCL 5 MG/ML IV SOLN
20.0000 mg | INTRAVENOUS | Status: DC | PRN
  Administered 2023-09-12: 20 mg via INTRAVENOUS
  Filled 2023-09-11: qty 4

## 2023-09-11 MED ORDER — DOFETILIDE 250 MCG PO CAPS
375.0000 ug | ORAL_CAPSULE | Freq: Two times a day (BID) | ORAL | Status: DC
  Administered 2023-09-11 – 2023-09-13 (×4): 375 ug via ORAL
  Filled 2023-09-11 (×6): qty 1

## 2023-09-11 MED ORDER — WARFARIN SODIUM 2.5 MG PO TABS
2.5000 mg | ORAL_TABLET | Freq: Once | ORAL | Status: AC
Start: 2023-09-11 — End: 2023-09-11
  Administered 2023-09-11: 2.5 mg via ORAL
  Filled 2023-09-11: qty 1

## 2023-09-11 MED ORDER — IRBESARTAN 150 MG PO TABS
150.0000 mg | ORAL_TABLET | Freq: Every day | ORAL | Status: DC
  Administered 2023-09-12: 150 mg via ORAL
  Filled 2023-09-11: qty 1

## 2023-09-11 MED ORDER — ESCITALOPRAM OXALATE 10 MG PO TABS
10.0000 mg | ORAL_TABLET | Freq: Every day | ORAL | Status: DC
  Administered 2023-09-12 – 2023-09-13 (×2): 10 mg via ORAL
  Filled 2023-09-11 (×2): qty 1

## 2023-09-11 MED ORDER — ASPIRIN 81 MG PO CHEW
81.0000 mg | CHEWABLE_TABLET | Freq: Every day | ORAL | Status: DC
  Administered 2023-09-11 – 2023-09-13 (×3): 81 mg via ORAL
  Filled 2023-09-11 (×3): qty 1

## 2023-09-11 MED ORDER — PANTOPRAZOLE SODIUM 40 MG PO TBEC
40.0000 mg | DELAYED_RELEASE_TABLET | Freq: Every day | ORAL | Status: DC
  Administered 2023-09-12 – 2023-09-13 (×2): 40 mg via ORAL
  Filled 2023-09-11 (×2): qty 1

## 2023-09-11 MED ORDER — HYDROCODONE-ACETAMINOPHEN 5-325 MG PO TABS: 1.0000 | ORAL_TABLET | Freq: Four times a day (QID) | ORAL | Status: DC | PRN

## 2023-09-11 MED ORDER — NITROGLYCERIN 0.3 MG SL SUBL: 0.3000 mg | SUBLINGUAL_TABLET | SUBLINGUAL | Status: DC | PRN

## 2023-09-11 MED ORDER — AMLODIPINE BESYLATE 10 MG PO TABS
10.0000 mg | ORAL_TABLET | Freq: Every day | ORAL | Status: DC
  Administered 2023-09-12 – 2023-09-13 (×2): 10 mg via ORAL
  Filled 2023-09-11 (×3): qty 1

## 2023-09-11 MED ORDER — SODIUM CHLORIDE 0.9% FLUSH
3.0000 mL | Freq: Two times a day (BID) | INTRAVENOUS | Status: DC
  Administered 2023-09-11 – 2023-09-12 (×3): 3 mL via INTRAVENOUS

## 2023-09-11 MED ORDER — WARFARIN - PHARMACIST DOSING INPATIENT: Freq: Every day | Status: DC

## 2023-09-11 MED ORDER — CICLOPIROX 8 % EX SOLN: Freq: Every day | CUTANEOUS | Status: DC

## 2023-09-11 MED ORDER — POLYETHYLENE GLYCOL 3350 17 G PO PACK: 17.0000 g | PACK | Freq: Every day | ORAL | Status: DC | PRN

## 2023-09-11 NOTE — Assessment & Plan Note (Signed)
ovarian vein phlebolith. Seen on CT see above. Outpatient GYN eval. Patient is pending an MR abd.

## 2023-09-11 NOTE — Progress Notes (Signed)
[facility] dayshift admitting team:  This case was initially to be admitted at [facility].  However, they do not have beds available at the moment.  Her EKG was nonischemic, has been anticoagulated with a therapeutic INR, has minimal troponin elevation and her pain seems to be noncardiac.  Her bed request has been changed to telemetry at [facility].  Sanda Klein, MD.

## 2023-09-11 NOTE — Progress Notes (Signed)
   09/11/23 2223  Oxygen Therapy/Pulse Ox  O2 Device Nasal Cannula  O2 Therapy Oxygen humidified  O2 Flow Rate (L/min) 2 L/min   Pt. placed on 2 lpm humidified oxygen via n/c, pts. home CPAP is unable to have Oxygen placed inline and declined hospitals equipment with concern to present severe allergies, used same therapy previous evening awaiting bed placement from [facility].

## 2023-09-11 NOTE — Assessment & Plan Note (Signed)
The HPI for full description and associated symptoms.  Need to rule out venous thromboembolism given interruption in anticoagulation therapy for the patient recently.  Will check lower extremity Dopplers as well as D-dimer.  Otherwise the principal concern is that patient may be having progressive coronary artery disease with critical ischemia given troponin elevation.  Continue with beta-blocker, I will start the patient on aspirin.  Continue with warfarin for paroxysmal A-fib and Tikosyn.  I will add on statin.  disSuction was ruled out with recent CAT scan, see images section

## 2023-09-11 NOTE — ED Notes (Signed)
Patient's family left to get patient's CPAP from home.  Patient's SPO2 is between  88% and 89% on room air.  Placed patient on 3 liter nasal cannula.  Patient's SPO2 is 96%.  RT will continue to monitor.

## 2023-09-11 NOTE — ED Notes (Signed)
[facility] at bedside 

## 2023-09-11 NOTE — Progress Notes (Signed)
Patient is compliant with her CPAP from home and her preference is to use her own machine during her admission to the hospital.  Patient's family is going retrieve her CPAP from home so she can use her CPAP here tonight.

## 2023-09-11 NOTE — H&P (Signed)
History and Physical    Patient: [name]:[id number] DOB: 1946-05-19 DOA: 09/10/2023 DOS: the patient was seen and examined on 09/11/2023 PCP: Bradd Canary, MD  Patient coming from: Home>[facility]> [facility]  Chief Complaint:  Chief Complaint  Patient presents with   Chest Pain   HPI: [name] is a 77 y.o. female with medical history significant of reported acute coronary syndrome in 2008 with attempted PCI, patient reports that the percutaneous intervention failed and patient has been medically managed since then.  Patient further describes that her general functional status is pretty limited due to "rheumatoid arthritis ".  Patient walks with a cane, stays indoors and walks slowly.  This is the most she exerts.  Patient further has paroxysmal atrial fibrillation that is managed with warfarin, patient describes that about 3 to 4 weeks ago she forgot her warfarin for a week.  But otherwise she has been pretty consistent with it and her INR is on target.  Patient reports being in her usual state of health till approximately 6 days ago when she reports a new onset of chest discomfort on and off mild to moderate sharp interscapular area and slowly moving to the anterior chest area.  There is no precipitating relieving factor.  No associated shortness of breath palpitation presyncope.  Patient does describe having had an episode of bilateral leg swelling about 2 weeks ago from today.  That has self resolved.  However the chest pain has continued on and off.  Patient had a workup done as an outpatient for same including CAT scan with contrast to rule out aortic pathology.  See documentation in chart.  Patient had another episode of the chest discomfort yesterday prompting her to come to the ER at [facility].  Workup at [facility] is notable for minimal troponin elevation however no EKG changes no other radiologic abnormalities noted.  See below.  Patient is now  transferred to [facility] long progressive care unit.  Unfortunately patient reports ongoing discomfort.  Medical evaluation is sought. Review of Systems: As mentioned in the history of present illness. All other systems reviewed and are negative. Past Medical History:  Diagnosis Date   Anxiety    Arthritis    Atrial fibrillation (HCC) 2008   CAD (coronary artery disease)    Crohn's colitis (HCC)    she reports ulcerative colitis beginning in her 70's, biopsies 2008 suggest Crohn's not UC   Depression    Diverticulosis    GERD (gastroesophageal reflux disease)    Hyperlipidemia    Hypertension    IBS (irritable bowel syndrome)    Keloid of skin    Obesity    OSA (obstructive sleep apnea)    Pancreatitis    Paroxysmal atrial fibrillation (HCC)    Pseudoaneurysm of right femoral artery (HCC)    Renal oncocytoma    s/p right nephrectomy   Rheumatoid arthritis (HCC) 05/29/2017   UC (ulcerative colitis) (HCC)    Past Surgical History:  Procedure Laterality Date   ANGIOPLASTY     BREAST BIOPSY  10/24/1994   benign lesion    Cataract surgery Left 11/13/2014   CHOLECYSTECTOMY  10/24/2006   COLONOSCOPY W/ BIOPSIES     CORONARY ANGIOPLASTY     ESOPHAGOGASTRODUODENOSCOPY     NEPHRECTOMY  10/24/2006   right secondary to oncocytoma    RIGHT FEMORAL  PSEUDOANEURYSM & RIGHT GROIN HEMATOMA EVACUATION  02/22/2007   RIGHT RENAL ARTERY REPAIR  10/24/1974  TUBAL LIGATION     Social History:  reports that she has never smoked. She has never used smokeless tobacco. She reports that she does not drink alcohol and does not use drugs.  Allergies  Allergen Reactions   Hydralazine Diarrhea and Nausea Only    Anorexia, upset stomach, burning pain in abdomin   Cefdinir     Pt cannot take; may interfere with AFib.   Codeine     Heart beats fast    Fosamax [Alendronate Sodium] Other (See Comments)    GI upset   Gluten Meal    Guaifenesin Er Nausea And Vomiting and Other (See Comments)     Headaches; can tolerate liquid.   Latex Other (See Comments)    Breathing problems   Percocet [Oxycodone-Acetaminophen]     Itching & swelling in jaw   Psyllium Other (See Comments)    Family History  Problem Relation Age of Onset   Hypertension Mother    Coronary artery disease Mother    Stroke Mother    Clotting disorder Mother    Heart disease Mother    Allergies Mother    Colitis Father    Heart disease Father    Clotting disorder Sister    Stroke Brother    Clotting disorder Brother    Cancer Daughter    Asthma Daughter    Asthma Maternal Uncle    Colon cancer Paternal Aunt    Colon polyps Neg Hx    Kidney disease Neg Hx    Stomach cancer Neg Hx    Esophageal cancer Neg Hx     Prior to Admission medications   Medication Sig Start Date End Date Taking? Authorizing Provider  amLODipine (NORVASC) 10 MG tablet TAKE 1 TABLET BY MOUTH EVERY DAY 02/16/23  Yes Crenshaw, Madolyn Frieze, MD  dextrose 5 % SOLN 50 mL with methotrexate 25 MG/ML (PF) SOLN 40 mg/m2 Inject 40 mg/m2 into the vein once a week. Every wednesday   Yes [provider]  dofetilide (TIKOSYN) 125 MCG capsule TAKE 3 CAPSULES BY MOUTH TWICE A DAY 05/22/23  Yes Lewayne Bunting, MD  doxazosin (CARDURA) 2 MG tablet TAKE 1 TABLET BY MOUTH EVERY DAY 02/16/23  Yes Lewayne Bunting, MD  escitalopram (LEXAPRO) 10 MG tablet TAKE 1 TABLET BY MOUTH EVERY DAY 08/04/23  Yes Bradd Canary, MD  esomeprazole (NEXIUM) 40 MG capsule Take 1 capsule (40 mg total) by mouth daily. Take in the morning before breakfast. 01/15/18  Yes Esterwood, Amy S, PA-C  folic acid (FOLVITE) 1 MG tablet Take 2 mg by mouth daily. 09/25/19  Yes [provider]  HYDROcodone-acetaminophen (NORCO/VICODIN) 5-325 MG tablet Take 1 tablet by mouth every 6 (six) hours as needed for severe pain (pain score 7-10). 09/06/23  Yes Pollyann Savoy, MD  inFLIXimab 10 mg/kg in sodium chloride 0.9 % Inject 10 mg/kg into the vein every 6 (six) weeks.   Yes  [provider]  loperamide (IMODIUM A-D) 2 MG tablet Take 1 mg by mouth as needed. Dihrrhea   Yes [provider]  metoprolol tartrate (LOPRESSOR) 25 MG tablet TAKE 1 TABLET (25 MG) BY MOUTH IN THE MORNING AND AT BEDTIME 02/03/23  Yes Crenshaw, Madolyn Frieze, MD  Multiple Vitamin (MULTIVITAMIN) capsule Take 1 capsule by mouth daily. 1 daily   Yes [provider]  nitroGLYCERIN (NITROSTAT) 0.4 MG SL tablet Place 1 tablet (0.4 mg total) under the tongue every 5 (five) minutes as needed for chest pain. Do not  exceed 3 tabs in 15 minutes 08/20/18  Yes Crenshaw, Madolyn Frieze, MD  Probiotic Product [facility] COLON HEALTH) CAPS Take 1 capsule by mouth daily. 1 per day   Yes [provider]  tiZANidine (ZANAFLEX) 2 MG tablet Take 0.5-1 tablets (1-2 mg total) by mouth every 8 (eight) hours as needed for muscle spasms. 09/07/23  Yes Bradd Canary, MD  TUBERCULIN SYR 1CC/27GX1/2" 27G X 1/2" 1 ML MISC    Yes [provider]  valsartan (DIOVAN) 160 MG tablet TAKE 1 TABLET BY MOUTH EVERY DAY 05/01/23  Yes Lewayne Bunting, MD  warfarin (COUMADIN) 2.5 MG tablet TAKE 1/2 TABLET TO 1 TABLET BY MOUTH DAILY AS DIRECTED BY COUMADIN CLINIC 08/24/23  Yes Lewayne Bunting, MD  ciclopirox (PENLAC) 8 % solution Apply topically at bedtime. Apply over nail and surrounding skin. Apply daily over previous coat. After seven (7) days, may remove with alcohol and continue cycle. 08/29/23   Pilar Plate, DPM    Physical Exam: Vitals:   09/11/23 1130 09/11/23 1350 09/11/23 1441 09/11/23 1854  BP: (!) 143/53  (!) 185/73 (!) 160/57  Pulse: 60  79 76  Resp: 20  16 18   Temp: 98.1 F (36.7 C) 98 F (36.7 C) 97.8 F (36.6 C) 97.9 F (36.6 C)  TempSrc:  Oral Oral Oral  SpO2: 97%  99% 92%  Weight:      Height:       General: Obese appearing lady appears to be in no distress on room air Respiratory exam: Bilateral intravesicular Cardiovascular exam S1-S2 normal Abdomen all  quadrants soft nontender Extremities warm without edema no focal motor deficit. Husband at bedside for encounter, son was also present for part of the encounter. Data Reviewed:  Labs on Admission:  Results for orders placed or performed during the hospital encounter of 09/10/23 (from the past 24 hour(s))  Comprehensive metabolic panel     Status: Abnormal   Collection Time: 09/10/23  9:34 PM  Result Value Ref Range   Sodium 137 135 - 145 mmol/L   Potassium 4.1 3.5 - 5.1 mmol/L   Chloride 105 98 - 111 mmol/L   CO2 23 22 - 32 mmol/L   Glucose, Bld 105 (H) 70 - 99 mg/dL   BUN 13 8 - 23 mg/dL   Creatinine, Ser 1.61 (H) 0.44 - 1.00 mg/dL   Calcium 8.8 (L) 8.9 - 10.3 mg/dL   Total Protein 7.4 6.5 - 8.1 g/dL   Albumin 3.5 3.5 - 5.0 g/dL   AST 25 15 - 41 U/L   ALT 20 0 - 44 U/L   Alkaline Phosphatase 83 38 - 126 U/L   Total Bilirubin 0.7 <1.2 mg/dL   GFR, Estimated 49 (L) >60 mL/min   Anion gap 9 5 - 15  Protime-INR     Status: Abnormal   Collection Time: 09/10/23  9:34 PM  Result Value Ref Range   Prothrombin Time 27.6 (H) 11.4 - 15.2 seconds   INR 2.5 (H) 0.8 - 1.2  Troponin I (High Sensitivity)     Status: Abnormal   Collection Time: 09/10/23  9:34 PM  Result Value Ref Range   Troponin I (High Sensitivity) 21 (H) <18 ng/L  Lactic acid, plasma     Status: None   Collection Time: 09/10/23  9:34 PM  Result Value Ref Range   Lactic Acid, Venous 1.1 0.5 - 1.9 mmol/L  Urinalysis, Routine w reflex microscopic -Urine, Clean Catch     Status: Abnormal  Collection Time: 09/10/23 10:08 PM  Result Value Ref Range   Color, Urine YELLOW YELLOW   APPearance CLEAR CLEAR   Specific Gravity, Urine 1.010 1.005 - 1.030   pH 6.0 5.0 - 8.0   Glucose, UA NEGATIVE NEGATIVE mg/dL   Hgb urine dipstick TRACE (A) NEGATIVE   Bilirubin Urine NEGATIVE NEGATIVE   Ketones, ur NEGATIVE NEGATIVE mg/dL   Protein, ur NEGATIVE NEGATIVE mg/dL   Nitrite NEGATIVE NEGATIVE   Leukocytes,Ua NEGATIVE NEGATIVE   Urinalysis, Microscopic (reflex)     Status: Abnormal   Collection Time: 09/10/23 10:08 PM  Result Value Ref Range   RBC / HPF 0-5 0 - 5 RBC/hpf   WBC, UA 0-5 0 - 5 WBC/hpf   Bacteria, UA RARE (A) NONE SEEN   Squamous Epithelial / HPF 0-5 0 - 5 /HPF  Troponin I (High Sensitivity)     Status: Abnormal   Collection Time: 09/10/23 11:53 PM  Result Value Ref Range   Troponin I (High Sensitivity) 25 (H) <18 ng/L  Troponin I (High Sensitivity)     Status: Abnormal   Collection Time: 09/11/23  8:05 AM  Result Value Ref Range   Troponin I (High Sensitivity) 21 (H) <18 ng/L  Protime-INR     Status: Abnormal   Collection Time: 09/11/23  8:05 AM  Result Value Ref Range   Prothrombin Time 27.7 (H) 11.4 - 15.2 seconds   INR 2.6 (H) 0.8 - 1.2   Basic Metabolic Panel: Recent Labs  Lab 09/06/23 0247 09/10/23 2134  NA 136 137  K 3.7 4.1  CL 103 105  CO2 26 23  GLUCOSE 102* 105*  BUN 18 13  CREATININE 1.24* 1.15*  CALCIUM 8.7* 8.8*   Liver Function Tests: Recent Labs  Lab 09/10/23 2134  AST 25  ALT 20  ALKPHOS 83  BILITOT 0.7  PROT 7.4  ALBUMIN 3.5   No results for input(s): "LIPASE", "AMYLASE" in the last 168 hours. No results for input(s): "AMMONIA" in the last 168 hours. CBC: Recent Labs  Lab 09/06/23 0247 09/10/23 1910  WBC 8.5 10.0  NEUTROABS 5.1 7.6  HGB 12.5 12.4  HCT 38.1 38.7  MCV 98.4 98.5  PLT 227 219   Cardiac Enzymes: Recent Labs  Lab 09/06/23 0247 09/06/23 0359 09/10/23 2134 09/10/23 2353 09/11/23 0805  TROPONINIHS 14 14 21* 25* 21*    BNP (last 3 results) No results for input(s): "PROBNP" in the last 8760 hours. CBG: No results for input(s): "GLUCAP" in the last 168 hours.  Radiological Exams on Admission:  CT ABDOMEN PELVIS W CONTRAST  Result Date: 09/10/2023 CLINICAL DATA:  Increase in left lower quadrant pain EXAM: CT ABDOMEN AND PELVIS WITH CONTRAST TECHNIQUE: Multidetector CT imaging of the abdomen and pelvis was performed using the  standard protocol following bolus administration of intravenous contrast. RADIATION DOSE REDUCTION: This exam was performed according to the departmental dose-optimization program which includes automated exposure control, adjustment of the mA and/or kV according to patient size and/or use of iterative reconstruction technique. CONTRAST:  OMNIPAQUE IOHEXOL 300 MG/ML  SOLN COMPARISON:  09/06/2023 FINDINGS: Lower chest: No acute abnormality. Hepatobiliary: No focal liver abnormality is seen. Status post cholecystectomy. No biliary dilatation. Pancreas: Unremarkable. No pancreatic ductal dilatation or surrounding inflammatory changes. Spleen: Normal in size without focal abnormality. Adrenals/Urinary Tract: Adrenal glands are within normal limits. 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 The previously seen perfusion defect in the left kidney is no longer identified. Normal excretion is noted. No renal  calculi are seen. Previously described left ureteral stone actually represents a left ovarian vein phlebolith and is stable in appearance. The ureter on the left is unremarkable. Bladder is well distended. Stomach/Bowel: Scattered diverticular change of the colon is noted without evidence of diverticulitis. The appendix is within normal limits. Small bowel and stomach are unremarkable. Vascular/Lymphatic: Aortic atherosclerosis. No enlarged abdominal or pelvic lymph nodes. Reproductive: Uterus and bilateral adnexa are unremarkable. Other: No abdominal wall hernia or abnormality. No abdominopelvic ascites. Musculoskeletal: No acute or significant osseous findings. IMPRESSION: No acute abnormality noted. Stable diverticulosis without diverticulitis. Previously described left ureteral stone actually represents a left ovarian vein phlebolith. No obstructive changes seen. Resolution of previously seen perfusion defect in the left kidney. Electronically Signed   By: Alcide Clever M.D.   On: 09/10/2023 23:33   DG  Chest 2 View  Result Date: 09/10/2023 CLINICAL DATA:  Mid back pain EXAM: CHEST - 2 VIEW COMPARISON:  05/23/2023 FINDINGS: Heart and mediastinal contours are within normal limits. Increased markings in the lung bases, favor atelectasis. No effusions or pneumothorax. No acute bony abnormality. IMPRESSION: Bibasilar atelectasis.  No active disease. Electronically Signed   By: Charlett Nose M.D.   On: 09/10/2023 19:37    EKG: Independently reviewed. NSR   Assessment and Plan: * Chest pain The HPI for full description and associated symptoms.  Need to rule out venous thromboembolism given interruption in anticoagulation therapy for the patient recently.  Will check lower extremity Dopplers as well as D-dimer.  Otherwise the principal concern is that patient may be having progressive coronary artery disease with critical ischemia given troponin elevation.  Continue with beta-blocker, I will start the patient on aspirin.  Continue with warfarin for paroxysmal A-fib and Tikosyn.  I will add on statin.  disSuction was ruled out with recent CAT scan, see images section  Ovarian mass ovarian vein phlebolith. Seen on CT see above. Outpatient GYN eval. Patient is pending an MR abd.  Rheumatoid arthritis (HCC) No flair. Patient gets weekly methotrexate infusion as well as 6 weekly infliximab.  Resume after discharge.  Afib (HCC) C.w. warfarin. Tykosin.  Obesity Reports prescription of CPAP.  Patient has CPAP at bedside.  Son reports that the patient still gets hypoxic, I will order continuous overnight oximetry.  Respiratory therapist consult requested for CPAP help.      Advance Care Planning:   Code Status: Full Code discussed with patient and family  Consults: I have sent a message to [facility] ( NP Brion Aliment). Kindly follow up with with them in AM  Family Communication: husband and son at bedside. All questions answered.  Severity of Illness: The appropriate patient status for this patient is  INPATIENT. Inpatient status is judged to be reasonable and necessary in order to provide the required intensity of service to ensure the patient's safety. The patient's presenting symptoms, physical exam findings, and initial radiographic and laboratory data in the context of their chronic comorbidities is felt to place them at high risk for further clinical deterioration. Furthermore, it is not anticipated that the patient will be medically stable for discharge from the hospital within 2 midnights of admission.   * I certify that at the point of admission it is my clinical judgment that the patient will require inpatient hospital care spanning beyond 2 midnights from the point of admission due to high intensity of service, high risk for further deterioration and high frequency of surveillance required.*  Author: Nolberto Hanlon, MD 09/11/2023 8:09 PM  For on call review [email or web address].

## 2023-09-11 NOTE — Assessment & Plan Note (Signed)
Reports prescription of CPAP.  Patient has CPAP at bedside.  Son reports that the patient still gets hypoxic, I will order continuous overnight oximetry.  Respiratory therapist consult requested for CPAP help.

## 2023-09-11 NOTE — Plan of Care (Signed)

## 2023-09-11 NOTE — Assessment & Plan Note (Signed)
C.w. warfarin. Tykosin.

## 2023-09-11 NOTE — Progress Notes (Signed)
PHARMACY - ANTICOAGULATION CONSULT NOTE  Pharmacy Consult for warfarin Indication: hx atrial fibrillation  Allergies  Allergen Reactions   Hydralazine Diarrhea and Nausea Only    Anorexia, upset stomach, burning pain in abdomin   Cefdinir     Pt cannot take; may interfere with AFib.   Codeine     Heart beats fast    Fosamax [Alendronate Sodium] Other (See Comments)    GI upset   Gluten Meal    Guaifenesin Er Nausea And Vomiting and Other (See Comments)    Headaches; can tolerate liquid.   Latex Other (See Comments)    Breathing problems   Percocet [Oxycodone-Acetaminophen]     Itching & swelling in jaw   Psyllium Other (See Comments)    Patient Measurements: Height: 5\' 3"  (160 cm) Weight: 111.5 kg (245 lb 13 oz) IBW/kg (Calculated) : 52.4 Heparin Dosing Weight:   Vital Signs: Temp: 97.9 F (36.6 C) (11/18 1854) Temp Source: Oral (11/18 1854) BP: 160/57 (11/18 1854) Pulse Rate: 76 (11/18 1854)  Labs: Recent Labs    09/10/23 1910 09/10/23 2134 09/10/23 2353 09/11/23 0805  HGB 12.4  --   --   --   HCT 38.7  --   --   --   PLT 219  --   --   --   LABPROT  --  27.6*  --  27.7*  INR  --  2.5*  --  2.6*  CREATININE  --  1.15*  --   --   TROPONINIHS  --  21* 25* 21*    Estimated Creatinine Clearance: 48.4 mL/min (A) (by C-G formula based on SCr of 1.15 mg/dL (H)).   Medical History: Past Medical History:  Diagnosis Date   Anxiety    Arthritis    Atrial fibrillation (HCC) 2008   CAD (coronary artery disease)    Crohn's colitis (HCC)    she reports ulcerative colitis beginning in her 59's, biopsies 2008 suggest Crohn's not UC   Depression    Diverticulosis    GERD (gastroesophageal reflux disease)    Hyperlipidemia    Hypertension    IBS (irritable bowel syndrome)    Keloid of skin    Obesity    OSA (obstructive sleep apnea)    Pancreatitis    Paroxysmal atrial fibrillation (HCC)    Pseudoaneurysm of right femoral artery (HCC)    Renal oncocytoma     s/p right nephrectomy   Rheumatoid arthritis (HCC) 05/29/2017   UC (ulcerative colitis) (HCC)     Medications:  -  PTA warfarin regimen per [facility] clinic note on 08/29/23: 1.25mg  daily except 2.5mg  on MWF (last dose taken on 09/10/23)  Assessment: [name] with hx afib on warfarin PTA who presented to the ED on 09/10/23 with c/o chest/abdominal pain. Pharmacy has been consulted on 09/11/23 to resume warfarin for pt.   Today, 09/11/2023: - INR is therapeutic at 2.6 - cbc on 09/10/23 ok - no significant drug-drug intxns  Goal of Therapy:  INR 2-3 Monitor platelets by anticoagulation protocol: Yes   Plan:  - warfarin 2.5 mg PO x1 today - daily INR - monitor for s/sx bleeding   [name] P 09/11/2023,8:10 PM

## 2023-09-11 NOTE — ED Notes (Signed)
Attempted report x1, receiving RN unavailable.  

## 2023-09-11 NOTE — Progress Notes (Signed)
To whom it may concern:  [name] has been Medically hospitalized since 09/10/2023. This note released to patient with permission.  Sincerely     Nolberto Hanlon

## 2023-09-11 NOTE — ED Notes (Signed)
Patient sitting in recliner

## 2023-09-11 NOTE — ED Notes (Signed)
Fall risk sign on door Fall risk armband Patient wearing shoes

## 2023-09-11 NOTE — Assessment & Plan Note (Addendum)
No flair. Patient gets weekly methotrexate infusion as well as 6 weekly infliximab.  Resume after discharge.

## 2023-09-11 NOTE — ED Notes (Signed)
Patient's SPO2 dropped as low as 86% while on her CPAP from home.  Placed patient back on 3 liter nasal cannula with humidity.  Patient tolerating well and her SPO2 is 98%.  RT offered to place patient on our CPAP.  Patient and family have a concern about her latex allergy and using a different mask and circuit and are more comfortable with [name]  being on a cannula at this time.  RT will continue to monitor.

## 2023-09-12 ENCOUNTER — Encounter (HOSPITAL_COMMUNITY): Payer: Self-pay | Admitting: Internal Medicine

## 2023-09-12 ENCOUNTER — Observation Stay (HOSPITAL_BASED_OUTPATIENT_CLINIC_OR_DEPARTMENT_OTHER): Payer: Medicare Other

## 2023-09-12 DIAGNOSIS — R7989 Other specified abnormal findings of blood chemistry: Secondary | ICD-10-CM | POA: Diagnosis not present

## 2023-09-12 DIAGNOSIS — R079 Chest pain, unspecified: Secondary | ICD-10-CM | POA: Diagnosis not present

## 2023-09-12 DIAGNOSIS — M7989 Other specified soft tissue disorders: Secondary | ICD-10-CM

## 2023-09-12 DIAGNOSIS — I10 Essential (primary) hypertension: Secondary | ICD-10-CM | POA: Diagnosis not present

## 2023-09-12 DIAGNOSIS — R0789 Other chest pain: Secondary | ICD-10-CM | POA: Diagnosis not present

## 2023-09-12 DIAGNOSIS — I4819 Other persistent atrial fibrillation: Secondary | ICD-10-CM | POA: Diagnosis not present

## 2023-09-12 LAB — ECHOCARDIOGRAM COMPLETE
AR max vel: 2.4 cm2
AV Area VTI: 2.35 cm2
AV Area mean vel: 2.26 cm2
AV Mean grad: 8 mm[Hg]
AV Peak grad: 12.3 mm[Hg]
Ao pk vel: 1.75 m/s
Area-P 1/2: 3.63 cm2
Height: 63 in
S' Lateral: 2.8 cm
Weight: 3933.01 [oz_av]

## 2023-09-12 LAB — BASIC METABOLIC PANEL
Anion gap: 8 (ref 5–15)
BUN: 16 mg/dL (ref 8–23)
CO2: 25 mmol/L (ref 22–32)
Calcium: 8.7 mg/dL — ABNORMAL LOW (ref 8.9–10.3)
Chloride: 102 mmol/L (ref 98–111)
Creatinine, Ser: 0.96 mg/dL (ref 0.44–1.00)
GFR, Estimated: >60 mL/min (ref >60)
Glucose, Bld: 105 mg/dL — ABNORMAL HIGH (ref 70–99)
Potassium: 4 mmol/L (ref 3.5–5.1)
Sodium: 135 mmol/L (ref 135–145)

## 2023-09-12 LAB — LIPID PANEL
Cholesterol: 135 mg/dL (ref 0–200)
HDL: 59 mg/dL (ref >40)
LDL Cholesterol: 67 mg/dL (ref 0–99)
Total CHOL/HDL Ratio: 2.3 {ratio}
Triglycerides: 47 mg/dL (ref <150)
VLDL: 9 mg/dL (ref 0–40)

## 2023-09-12 LAB — CBC
HCT: 37.6 % (ref 36.0–46.0)
Hemoglobin: 12.1 g/dL (ref 12.0–15.0)
MCH: 32.7 pg (ref 26.0–34.0)
MCHC: 32.2 g/dL (ref 30.0–36.0)
MCV: 101.6 fL — ABNORMAL HIGH (ref 80.0–100.0)
Platelets: 215 10*3/uL (ref 150–400)
RBC: 3.7 MIL/uL — ABNORMAL LOW (ref 3.87–5.11)
RDW: 16.5 % — ABNORMAL HIGH (ref 11.5–15.5)
WBC: 7.6 10*3/uL (ref 4.0–10.5)
nRBC: 0 % (ref 0.0–0.2)

## 2023-09-12 LAB — PROTIME-INR
INR: 2.2 — ABNORMAL HIGH (ref 0.8–1.2)
Prothrombin Time: 24.4 s — ABNORMAL HIGH (ref 11.4–15.2)

## 2023-09-12 LAB — APTT: aPTT: 42 s — ABNORMAL HIGH (ref 24–36)

## 2023-09-12 MED ORDER — IRBESARTAN 150 MG PO TABS
150.0000 mg | ORAL_TABLET | Freq: Two times a day (BID) | ORAL | Status: DC
  Administered 2023-09-12 – 2023-09-13 (×2): 150 mg via ORAL
  Filled 2023-09-12 (×2): qty 1

## 2023-09-12 MED ORDER — WARFARIN 1.25 MG HALF TABLET
1.2500 mg | ORAL_TABLET | Freq: Once | ORAL | Status: AC
  Administered 2023-09-12: 1.25 mg via ORAL
  Filled 2023-09-12: qty 1

## 2023-09-12 MED ORDER — DOXAZOSIN MESYLATE 4 MG PO TABS
4.0000 mg | ORAL_TABLET | Freq: Two times a day (BID) | ORAL | Status: DC
  Administered 2023-09-12: 4 mg via ORAL
  Filled 2023-09-12 (×2): qty 1

## 2023-09-12 MED ORDER — FENTANYL CITRATE PF 50 MCG/ML IJ SOSY: 50.0000 ug | PREFILLED_SYRINGE | INTRAMUSCULAR | Status: DC | PRN

## 2023-09-12 MED ORDER — ACETAMINOPHEN 325 MG PO TABS
650.0000 mg | ORAL_TABLET | Freq: Four times a day (QID) | ORAL | Status: DC | PRN
  Administered 2023-09-12: 650 mg via ORAL
  Filled 2023-09-12: qty 2

## 2023-09-12 NOTE — Progress Notes (Signed)
RN in the room supervising while Student Nurse completed head to toe assessment. Agree w/ Student Nurse documentation. Cosigned.

## 2023-09-12 NOTE — Plan of Care (Signed)

## 2023-09-12 NOTE — Progress Notes (Signed)
Lower extremity venous duplex completed. Please see CV Procedures for preliminary results.  Shona Simpson, RVT 09/12/23 10:29 AM

## 2023-09-12 NOTE — Progress Notes (Signed)
PROGRESS NOTE    [name]  UEA:[id number] DOB: [birth year] DOA: 09/10/2023 PCP: Bradd Canary, MD   Brief Narrative: 77 year old with past medical history significant for coronary syndrome in 2008 with attempted failed PCI with who has been medically managed, rheumatoid arthritis, paroxysmal A-fib on Coumadin who presents with chest discomfort on and off for the last 6 days prior to admission.  She does describe episode of bilateral lower extremity edema which has resolved.  She had a workup as an outpatient including a CT PA with contrast to rule out aortic pathology.  She presented with recurrent episode of chest discomfort.   Assessment & Plan:   Principal Problem:   Chest pain Active Problems:   Obesity   Afib (HCC)   Rheumatoid arthritis (HCC)   Ovarian mass   1-Chest pain: Recent episode of lower extremity edema Dopplers ordered D-dimer order -Continue beta-blocker aspirin -CTA chest 11/13: No evidence of acute aortic syndrome, aortic atherosclerosis in addition to left anterior descending coronary artery disease. -Troponin 23, 25 -D Dimer 0.27 Cardiology consulted. Pain could be related to uncontrolled BP or angina.   HTN:  Uncontrolled.  -Continue with metoprolol, doxazosin, Arb.  -Cardiology adjusting meds, doxazosin increase.   -ovarian mass: Left Ovarian vein phlebolith seen on CT.  She needs outpatient gynecology follow-up and MRI abdomen  Rheumatoid arthritis: -On week;ly methotrexate and infliximab.  She complaints of shoulder back pain. Needs to follow up with Rheumatologist/ortho./   A-fib: -Continue with Tykosin and artarine.   Obesity: -BMI 43, Morbid Obesity        Estimated body mass index is 43.54 kg/m as calculated from the following:   Height as of this encounter: 5\' 3"  (1.6 m).   Weight as of this encounter: 111.5 kg.   DVT prophylaxis: Warfarin  Code Status: Full code Family Communication: husband at bedside.   Disposition Plan:  Status is: Observation The patient remains OBS appropriate and will d/c before 2 midnights.    Consultants:  Cardiology   Procedures:  none  Antimicrobials:    Subjective: She is feeling ok, chest pain resolved. Complaints. Of shoulder pain, back pain   Objective: Vitals:   09/11/23 2122 09/12/23 0144 09/12/23 0543 09/12/23 0632  BP: (!) 160/62 (!) 164/64 (!) 171/73 (!) 155/59  Pulse: 88 78 74 67  Resp: 20 16 20    Temp:  98.6 F (37 C) 98.3 F (36.8 C)   TempSrc:  Oral Oral   SpO2:  99% 99%   Weight:      Height:        Intake/Output Summary (Last 24 hours) at 09/12/2023 0810 Last data filed at 09/11/2023 1807 Gross per 24 hour  Intake 220 ml  Output --  Net 220 ml   Filed Weights   09/10/23 1855  Weight: 111.5 kg    Examination:  General exam: Appears calm and comfortable  Respiratory system: Clear to auscultation. Respiratory effort normal. Cardiovascular system: S1 & S2 heard, RRR. No JVD, murmurs, rubs, gallops or clicks. No pedal edema. Gastrointestinal system: Abdomen is nondistended, soft and nontender. No organomegaly or masses felt. Normal bowel sounds heard. Central nervous system: Alert and oriented. No focal neurological deficits. Extremities: Symmetric 5 x 5 power. Skin: No rashes, lesions or ulcers Psychiatry: Judgement and insight appear normal. Mood & affect appropriate.     Data Reviewed: I have personally reviewed following labs and imaging studies  CBC: Recent Labs  Lab 09/06/23 0247 09/10/23 1910 09/12/23 0414  WBC 8.5  10.0 7.6  NEUTROABS 5.1 7.6  --   HGB 12.5 12.4 12.1  HCT 38.1 38.7 37.6  MCV 98.4 98.5 101.6*  PLT 227 219 215   Basic Metabolic Panel: Recent Labs  Lab 09/06/23 0247 09/10/23 2134 09/12/23 0414  NA 136 137 135  K 3.7 4.1 4.0  CL 103 105 102  CO2 26 23 25   GLUCOSE 102* 105* 105*  BUN 18 13 16   CREATININE 1.24* 1.15* 0.96  CALCIUM 8.7* 8.8* 8.7*   GFR: Estimated Creatinine  Clearance: 57.9 mL/min (by C-G formula based on SCr of 0.96 mg/dL). Liver Function Tests: Recent Labs  Lab 09/10/23 2134  AST 25  ALT 20  ALKPHOS 83  BILITOT 0.7  PROT 7.4  ALBUMIN 3.5   No results for input(s): "LIPASE", "AMYLASE" in the last 168 hours. No results for input(s): "AMMONIA" in the last 168 hours. Coagulation Profile: Recent Labs  Lab 09/06/23 0247 09/10/23 2134 09/11/23 0805 09/12/23 0414  INR 2.0* 2.5* 2.6* 2.2*   Cardiac Enzymes: No results for input(s): "CKTOTAL", "CKMB", "CKMBINDEX", "TROPONINI" in the last 168 hours. BNP (last 3 results) No results for input(s): "PROBNP" in the last 8760 hours. HbA1C: No results for input(s): "HGBA1C" in the last 72 hours. CBG: No results for input(s): "GLUCAP" in the last 168 hours. Lipid Profile: Recent Labs    09/12/23 0414  CHOL 135  HDL 59  LDLCALC 67  TRIG 47  CHOLHDL 2.3   Thyroid Function Tests: No results for input(s): "TSH", "T4TOTAL", "FREET4", "T3FREE", "THYROIDAB" in the last 72 hours. Anemia Panel: No results for input(s): "VITAMINB12", "FOLATE", "FERRITIN", "TIBC", "IRON", "RETICCTPCT" in the last 72 hours. Sepsis Labs: Recent Labs  Lab 09/10/23 2134  LATICACIDVEN 1.1    No results found for this or any previous visit (from the past 240 hour(s)).       Radiology Studies: CT ABDOMEN PELVIS W CONTRAST  Result Date: 09/10/2023 CLINICAL DATA:  Increase in left lower quadrant pain EXAM: CT ABDOMEN AND PELVIS WITH CONTRAST TECHNIQUE: Multidetector CT imaging of the abdomen and pelvis was performed using the standard protocol following bolus administration of intravenous contrast. RADIATION DOSE REDUCTION: This exam was performed according to the departmental dose-optimization program which includes automated exposure control, adjustment of the mA and/or kV according to patient size and/or use of iterative reconstruction technique. CONTRAST:  OMNIPAQUE IOHEXOL 300 MG/ML  SOLN COMPARISON:   09/06/2023 FINDINGS: Lower chest: No acute abnormality. Hepatobiliary: No focal liver abnormality is seen. Status post cholecystectomy. No biliary dilatation. Pancreas: Unremarkable. No pancreatic ductal dilatation or surrounding inflammatory changes. Spleen: Normal in size without focal abnormality. Adrenals/Urinary Tract: Adrenal glands are within normal limits. 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 The previously seen perfusion defect in the left kidney is no longer identified. Normal excretion is noted. No renal calculi are seen. Previously described left ureteral stone actually represents a left ovarian vein phlebolith and is stable in appearance. The ureter on the left is unremarkable. Bladder is well distended. Stomach/Bowel: Scattered diverticular change of the colon is noted without evidence of diverticulitis. The appendix is within normal limits. Small bowel and stomach are unremarkable. Vascular/Lymphatic: Aortic atherosclerosis. No enlarged abdominal or pelvic lymph nodes. Reproductive: Uterus and bilateral adnexa are unremarkable. Other: No abdominal wall hernia or abnormality. No abdominopelvic ascites. Musculoskeletal: No acute or significant osseous findings. IMPRESSION: No acute abnormality noted. Stable diverticulosis without diverticulitis. Previously described left ureteral stone actually represents a left ovarian vein phlebolith. No obstructive changes seen. Resolution of  previously seen perfusion defect in the left kidney. Electronically Signed   By: Alcide Clever M.D.   On: 09/10/2023 23:33   DG Chest 2 View  Result Date: 09/10/2023 CLINICAL DATA:  Mid back pain EXAM: CHEST - 2 VIEW COMPARISON:  05/23/2023 FINDINGS: Heart and mediastinal contours are within normal limits. Increased markings in the lung bases, favor atelectasis. No effusions or pneumothorax. No acute bony abnormality. IMPRESSION: Bibasilar atelectasis.  No active disease. Electronically Signed   By: Charlett Nose  M.D.   On: 09/10/2023 19:37        Scheduled Meds:  amLODipine  10 mg Oral Daily   aspirin  81 mg Oral Daily   atorvastatin  40 mg Oral Daily   dofetilide  375 mcg Oral BID   doxazosin  2 mg Oral Daily   escitalopram  10 mg Oral Daily   irbesartan  150 mg Oral Daily   lidocaine  1 patch Transdermal Q24H   metoprolol tartrate  25 mg Oral BID   pantoprazole  40 mg Oral Daily   sodium chloride flush  3 mL Intravenous Q12H   Warfarin - Pharmacist Dosing Inpatient   Does not apply q1600   Continuous Infusions:   LOS: 0 days    Time spent: 35 minutes    Shimika Ames A Prathik Aman, MD [facility]   If 7PM-7AM, please contact night-coverage [email or web address]  09/12/2023, 8:10 AM

## 2023-09-12 NOTE — Progress Notes (Signed)
   09/12/23 2100  BiPAP/CPAP/SIPAP  BiPAP/CPAP/SIPAP Pt Type Adult  BiPAP/CPAP/SIPAP Resmed  Reason BIPAP/CPAP not in use Other(comment)   Pt. Had family take CPAP home this date, aware to notify if wanting one set up, has been using humidified 2 lpm n/c which she stated she was tolerating well.

## 2023-09-12 NOTE — Consult Note (Signed)
Cardiology Consultation   Patient ID: JADZIA KREISEL MRN: [id number]; DOB: 1945/11/22  Admit date: 09/10/2023 Date of Consult: 09/12/2023  PCP:  Bradd Canary, MD   [facility] Providers Cardiologist:  Olga Millers, MD  Electrophysiologist:  Will Jorja Loa, MD       Patient Profile:   [name] is a 77 y.o. 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 Maryjean Ka.  History of Present Illness:   Ms. Germani follows with Dr. Jens Som and Dr. Elberta Fortis with the above history. She has been on Tikosyn many years and required prior cardioversion. She had breakthrough in summer 2024. Dr. Elberta Fortis saw her in 06/2023 and felt that given her morbid obesity she would not be a good candidate for ablation. He recommended to reconsider if BMI fell less than 40 and recommended f/u PCP eval for weight loss as well as compliance with CPAP.  She is on warfarin as patient felt Eliquis was causing leg heaviness, knee pain and spine discomfort. Last echo 2021 showed EF 60-65%, G2DD, moderately elevated pulm perssure, mod LAE, mild RAE.  She had Covid earlier this fall and felt like it really knocked her back a bit. She is generally limited at baseline due to her arthritis. She walks with a cane and is very sedentary.  She recently had outpatient CT head done for headache which showed sinus disease and chronic microvascular ischemia. She was recently seen in ED 09/06/23 with the back pain that  brought her back in this admission.  CTA c/a/p showed no dissection, + ureteral stone, indeterminate hypovascular area of L kidney pole of undetermined significance, aortic/coronary atherosclerosis, diverticulosis and possible morphologic changes of cirrhosis. Workup felt reassuring, discharged home. She returned to the hospital 11/17 with continued back pain with a component of chest discomfort as well.   She describes the back/chest pain as one whole entity, originally preceded by some left shoulder pain that occurred with specific movements like lifting her arm, moving it forward, bending her shoulder. In addition to migrating back pain (originally R upper back, then L upper back, then between shoulder blades, then middle back) she has also noticed associated migrating chest pains (sometimes in the middle, sometimes at the nipple, sometimes in a focal area right at the axilla, other times with a nagging sensation when she takes a deep breath). This chest pain has not clearly been associated with exertion, seems to occcur at random but does seem provoked by certain movement. She took Tylenol and muscle relaxer at home that seemed to help. She recalled missing her warfarin for a week a few weeks ago but otherwise had been adherent more recently. Labs show low level troponin elevation of 25->21, d-dimer negative, CBC OK, INR 2.5. LE venous duplex and 2d echo ordered by admitting team. EKGs show NSR without acute ST changes. Her BP has been elevated in the 140s-170s range. She reports improvement of her back/chest discomfort though still occurs at random.    Past Medical History:  Diagnosis Date   Anxiety  Arthritis    Atrial fibrillation (HCC) 2008   CAD (coronary artery disease)    Crohn's colitis (HCC)    she reports ulcerative colitis beginning in her 9's, biopsies 2008 suggest Crohn's not UC   Depression    Diverticulosis    GERD (gastroesophageal reflux disease)    Hyperlipidemia     Hypertension    IBS (irritable bowel syndrome)    Keloid of skin    Obesity    OSA (obstructive sleep apnea)    Pancreatitis    Paroxysmal atrial fibrillation (HCC)    Pseudoaneurysm of right femoral artery (HCC)    Renal oncocytoma    s/p right nephrectomy   Rheumatoid arthritis (HCC) 05/29/2017   UC (ulcerative colitis) (HCC)     Past Surgical History:  Procedure Laterality Date   ANGIOPLASTY     BREAST BIOPSY  10/24/1994   benign lesion    Cataract surgery Left 11/13/2014   CHOLECYSTECTOMY  10/24/2006   COLONOSCOPY W/ BIOPSIES     CORONARY ANGIOPLASTY     ESOPHAGOGASTRODUODENOSCOPY     NEPHRECTOMY  10/24/2006   right secondary to oncocytoma    RIGHT FEMORAL  PSEUDOANEURYSM & RIGHT GROIN HEMATOMA EVACUATION  02/22/2007   RIGHT RENAL ARTERY REPAIR  10/24/1974   TUBAL LIGATION       Home Medications:  Prior to Admission medications   Medication Sig Start Date End Date Taking? Authorizing Provider  amLODipine (NORVASC) 10 MG tablet TAKE 1 TABLET BY MOUTH EVERY DAY 02/16/23  Yes Crenshaw, Madolyn Frieze, MD  dextrose 5 % SOLN 50 mL with methotrexate 25 MG/ML (PF) SOLN 40 mg/m2 Inject 40 mg/m2 into the vein once a week. Every wednesday   Yes [provider]  dofetilide (TIKOSYN) 125 MCG capsule TAKE 3 CAPSULES BY MOUTH TWICE A DAY 05/22/23  Yes Lewayne Bunting, MD  doxazosin (CARDURA) 2 MG tablet TAKE 1 TABLET BY MOUTH EVERY DAY 02/16/23  Yes Lewayne Bunting, MD  escitalopram (LEXAPRO) 10 MG tablet TAKE 1 TABLET BY MOUTH EVERY DAY 08/04/23  Yes Bradd Canary, MD  esomeprazole (NEXIUM) 40 MG capsule Take 1 capsule (40 mg total) by mouth daily. Take in the morning before breakfast. 01/15/18  Yes Esterwood, Amy S, PA-C  folic acid (FOLVITE) 1 MG tablet Take 2 mg by mouth daily. 09/25/19  Yes [provider]  HYDROcodone-acetaminophen (NORCO/VICODIN) 5-325 MG tablet Take 1 tablet by mouth every 6 (six) hours as needed for severe pain (pain score 7-10). 09/06/23  Yes  Pollyann Savoy, MD  inFLIXimab 10 mg/kg in sodium chloride 0.9 % Inject 10 mg/kg into the vein every 6 (six) weeks.   Yes [provider]  loperamide (IMODIUM A-D) 2 MG tablet Take 1 mg by mouth as needed. Dihrrhea   Yes [provider]  metoprolol tartrate (LOPRESSOR) 25 MG tablet TAKE 1 TABLET (25 MG) BY MOUTH IN THE MORNING AND AT BEDTIME 02/03/23  Yes Crenshaw, Madolyn Frieze, MD  Multiple Vitamin (MULTIVITAMIN) capsule Take 1 capsule by mouth daily. 1 daily   Yes [provider]  nitroGLYCERIN (NITROSTAT) 0.4 MG SL tablet Place 1 tablet (0.4 mg total) under the tongue every 5 (five) minutes as needed for chest pain. Do not exceed 3 tabs in 15 minutes 08/20/18  Yes Crenshaw, Madolyn Frieze, MD  Probiotic Product [facility] COLON HEALTH) CAPS Take 1 capsule by mouth daily. 1 per day   Yes [provider]  tiZANidine (ZANAFLEX) 2 MG tablet Take 0.5-1 tablets (1-2 mg  total) by mouth every 8 (eight) hours as needed for muscle spasms. 09/07/23  Yes Bradd Canary, MD  TUBERCULIN SYR 1CC/27GX1/2" 27G X 1/2" 1 ML MISC    Yes [provider]  valsartan (DIOVAN) 160 MG tablet TAKE 1 TABLET BY MOUTH EVERY DAY 05/01/23  Yes Lewayne Bunting, MD  warfarin (COUMADIN) 2.5 MG tablet TAKE 1/2 TABLET TO 1 TABLET BY MOUTH DAILY AS DIRECTED BY COUMADIN CLINIC Patient taking differently: Take 2.5 mg by mouth See admin instructions. TAKE 1/2 TABLET TO 1 TABLET BY MOUTH DAILY AS DIRECTED BY COUMADIN CLINIC 08/24/23  Yes Lewayne Bunting, MD  ciclopirox [facility]) 8 % solution Apply topically at bedtime. Apply over nail and surrounding skin. Apply daily over previous coat. After seven (7) days, may remove with alcohol and continue cycle. 08/29/23   Standiford, Jenelle Mages, DPM    Inpatient Medications: Scheduled Meds:  amLODipine  10 mg Oral Daily   aspirin  81 mg Oral Daily   atorvastatin  40 mg Oral Daily   dofetilide  375 mcg Oral BID   doxazosin  2 mg Oral Daily   escitalopram   10 mg Oral Daily   irbesartan  150 mg Oral Daily   lidocaine  1 patch Transdermal Q24H   metoprolol tartrate  25 mg Oral BID   pantoprazole  40 mg Oral Daily   sodium chloride flush  3 mL Intravenous Q12H   warfarin  1.25 mg Oral ONCE-1600   Warfarin - Pharmacist Dosing Inpatient   Does not apply q1600   Continuous Infusions:  PRN Meds: fentaNYL (SUBLIMAZE) injection, hydrALAZINE, HYDROcodone-acetaminophen, labetalol, nitroGLYCERIN, polyethylene glycol  Allergies:    Allergies  Allergen Reactions   Hydralazine Diarrhea and Nausea Only    Anorexia, upset stomach, burning pain in abdomin   Cefdinir     Pt cannot take; may interfere with AFib.   Codeine     Heart beats fast    Fosamax [Alendronate Sodium] Other (See Comments)    GI upset   Gluten Meal    Guaifenesin Er Nausea And Vomiting and Other (See Comments)    Headaches; can tolerate liquid.   Latex Other (See Comments)    Breathing problems   Percocet [Oxycodone-Acetaminophen]     Itching & swelling in jaw   Psyllium Other (See Comments)    Social History:   Social History   Socioeconomic History   Marital status: Married    Spouse name: Not on file   Number of children: 3   Years of education: Not on file   Highest education level: 12th grade  Occupational History   Occupation: Retired    Associate Professor: RETIRED    Comment: retired  Tobacco Use   Smoking status: Never   Smokeless tobacco: Never  Vaping Use   Vaping status: Never Used  Substance and Sexual Activity   Alcohol use: No   Drug use: No   Sexual activity: Never  Other Topics Concern   Not on file  Social History Narrative   Married since 1965 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, 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 - adults, 2 grandchildrenNo pets -Daughter has breast cancer   Social Determinants of Health   Financial Resource Strain: Low Risk  (06/06/2023)   Overall Financial Resource Strain (CARDIA)    Difficulty of Paying Living Expenses: Not  very hard  Food Insecurity: No Food Insecurity (09/11/2023)   Hunger Vital Sign    Worried About Running Out of Food in the Last Year:  Never true    Ran Out of Food in the Last Year: Never true  Transportation Needs: No Transportation Needs (09/11/2023)   PRAPARE - Administrator, Civil Service (Medical): No    Lack of Transportation (Non-Medical): No  Physical Activity: Patient Declined (06/06/2023)   Exercise Vital Sign    Days of Exercise per Week: Patient declined    Minutes of Exercise per Session: Patient declined  Stress: No Stress Concern Present (04/24/2023)   [employer] of Occupational Health - Occupational Stress Questionnaire    Feeling of Stress : Not at all  Social Connections: Unknown (06/06/2023)   Social Connection and Isolation Panel [NHANES]    Frequency of Communication with Friends and Family: More than three times a week    Frequency of Social Gatherings with Friends and Family: More than three times a week    Attends Religious Services: Patient declined    Database administrator or Organizations: Yes    Attends Banker Meetings: 1 to 4 times per year    Marital Status: Married  Catering manager Violence: Not At Risk (09/11/2023)   Humiliation, Afraid, Rape, and Kick questionnaire    Fear of Current or Ex-Partner: No    Emotionally Abused: No    Physically Abused: No    Sexually Abused: No    Family History:    Family History  Problem Relation Age of Onset   Hypertension Mother    Coronary artery disease Mother    Stroke Mother    Clotting disorder Mother    Heart disease Mother    Allergies Mother    Colitis Father    Heart disease Father    Clotting disorder Sister    Stroke Brother    Clotting disorder Brother    Cancer Daughter    Asthma Daughter    Asthma Maternal Uncle    Colon cancer Paternal Aunt    Colon polyps Neg Hx    Kidney disease Neg Hx    Stomach cancer Neg Hx    Esophageal cancer Neg Hx      ROS:   Please see the history of present illness.   All other ROS reviewed and negative.     Physical Exam/Data:   Vitals:   09/12/23 0144 09/12/23 0543 09/12/23 0632 09/12/23 0832  BP: (!) 164/64 (!) 171/73 (!) 155/59 (!) 142/64  Pulse: 78 74 67 66  Resp: 16 20    Temp: 98.6 F (37 C) 98.3 F (36.8 C)  97.6 F (36.4 C)  TempSrc: Oral Oral  Oral  SpO2: 99% 99%    Weight:      Height:        Intake/Output Summary (Last 24 hours) at 09/12/2023 1027 Last data filed at 09/11/2023 1807 Gross per 24 hour  Intake 220 ml  Output --  Net 220 ml      09/10/2023    6:55 PM 09/07/2023    9:00 AM 09/05/2023    9:55 PM  Last 3 Weights  Weight (lbs) 245 lb 13 oz 245 lb 12.8 oz 244 lb  Weight (kg) 111.5 kg 111.494 kg 110.678 kg     Body mass index is 43.54 kg/m.  General: Well developed, well nourished, in no acute distress. Head: Normocephalic, atraumatic, sclera non-icteric, no xanthomas, nares are without discharge. Neck: Negative for carotid bruits. JVP not elevated. Lungs: Clear bilaterally to auscultation without wheezes, rales, or rhonchi. Breathing is unlabored. Heart: RRR S1 S2 without murmurs, rubs,  or gallops.  Abdomen: Soft, non-tender, non-distended with normoactive bowel sounds. No rebound/guarding. Extremities: No clubbing or cyanosis. No edema. Distal pedal pulses are 2+ and equal bilaterally. Neuro: Alert and oriented X 3. Moves all extremities spontaneously. Psych:  Responds to questions appropriately with a normal affect.   EKG:  The EKG was personally reviewed and demonstrates:  11/17: NSR 65bpm, no acute STT changes, nonspecific TWI III, QTc Today: NSR 72bpm, nonspecific TWI avL, QTc   Telemetry:  Telemetry was personally reviewed and demonstrates:  NSR, rare breakthrough SVT and brief suspected PAF (<5 seconds)  Relevant CV Studies: 2d echo 2021   1. Left ventricular ejection fraction, by estimation, is 60 to 65%. The  left ventricle has normal  function. The left ventricle has no regional  wall motion abnormalities. Left ventricular diastolic parameters are  consistent with Grade II diastolic  dysfunction (pseudonormalization). The average left ventricular global  longitudinal strain is -22.6 %.   2. Right ventricular systolic function is normal. The right ventricular  size is normal. There is moderately elevated pulmonary artery systolic  pressure.   3. Left atrial size was moderately dilated.   4. Right atrial size was mildly dilated.   5. The mitral valve is normal in structure. Trivial mitral valve  regurgitation. No evidence of mitral stenosis.   6. The aortic valve is normal in structure. Aortic valve regurgitation is  not visualized. No aortic stenosis is present.   Comparison(s): 04/02/13 EF 60-65%. PA pressure .    Laboratory Data:  High Sensitivity Troponin:   Recent Labs  Lab 09/06/23 0247 09/06/23 0359 09/10/23 2134 09/10/23 2353 09/11/23 0805  TROPONINIHS 14 14 21* 25* 21*     Chemistry Recent Labs  Lab 09/06/23 0247 09/10/23 2134 09/12/23 0414  NA 136 137 135  K 3.7 4.1 4.0  CL 103 105 102  CO2 26 23 25   GLUCOSE 102* 105* 105*  BUN 18 13 16   CREATININE 1.24* 1.15* 0.96  CALCIUM 8.7* 8.8* 8.7*  GFRNONAA 45* 49* >60  ANIONGAP 7 9 8     Recent Labs  Lab 09/10/23 2134  PROT 7.4  ALBUMIN 3.5  AST 25  ALT 20  ALKPHOS 83  BILITOT 0.7   Lipids  Recent Labs  Lab 09/12/23 0414  CHOL 135  TRIG 47  HDL 59  LDLCALC 67  CHOLHDL 2.3    Hematology Recent Labs  Lab 09/06/23 0247 09/10/23 1910 09/12/23 0414  WBC 8.5 10.0 7.6  RBC 3.87 3.93 3.70*  HGB 12.5 12.4 12.1  HCT 38.1 38.7 37.6  MCV 98.4 98.5 101.6*  MCH 32.3 31.6 32.7  MCHC 32.8 32.0 32.2  RDW 16.7* 16.5* 16.5*  PLT 227 219 215   Thyroid No results for input(s): "TSH", "FREET4" in the last 168 hours.  BNPNo results for input(s): "BNP", "PROBNP" in the last 168 hours.  DDimer  Recent Labs  Lab 09/11/23 2208   DDIMER <0.27     Radiology/Studies:  CT ABDOMEN PELVIS W CONTRAST  Result Date: 09/10/2023 CLINICAL DATA:  Increase in left lower quadrant pain EXAM: CT ABDOMEN AND PELVIS WITH CONTRAST TECHNIQUE: Multidetector CT imaging of the abdomen and pelvis was performed using the standard protocol following bolus administration of intravenous contrast. RADIATION DOSE REDUCTION: This exam was performed according to the departmental dose-optimization program which includes automated exposure control, adjustment of the mA and/or kV according to patient size and/or use of iterative reconstruction technique. CONTRAST:  OMNIPAQUE IOHEXOL 300 MG/ML  SOLN  COMPARISON:  09/06/2023 FINDINGS: Lower chest: No acute abnormality. Hepatobiliary: No focal liver abnormality is seen. Status post cholecystectomy. No biliary dilatation. Pancreas: Unremarkable. No pancreatic ductal dilatation or surrounding inflammatory changes. Spleen: Normal in size without focal abnormality. Adrenals/Urinary Tract: Adrenal glands are within normal limits. 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 The previously seen perfusion defect in the left kidney is no longer identified. Normal excretion is noted. No renal calculi are seen. Previously described left ureteral stone actually represents a left ovarian vein phlebolith and is stable in appearance. The ureter on the left is unremarkable. Bladder is well distended. Stomach/Bowel: Scattered diverticular change of the colon is noted without evidence of diverticulitis. The appendix is within normal limits. Small bowel and stomach are unremarkable. Vascular/Lymphatic: Aortic atherosclerosis. No enlarged abdominal or pelvic lymph nodes. Reproductive: Uterus and bilateral adnexa are unremarkable. Other: No abdominal wall hernia or abnormality. No abdominopelvic ascites. Musculoskeletal: No acute or significant osseous findings. IMPRESSION: No acute abnormality noted. Stable diverticulosis without  diverticulitis. Previously described left ureteral stone actually represents a left ovarian vein phlebolith. No obstructive changes seen. Resolution of previously seen perfusion defect in the left kidney. Electronically Signed   By: Alcide Clever M.D.   On: 09/10/2023 23:33   DG Chest 2 View  Result Date: 09/10/2023 CLINICAL DATA:  Mid back pain EXAM: CHEST - 2 VIEW COMPARISON:  05/23/2023 FINDINGS: Heart and mediastinal contours are within normal limits. Increased markings in the lung bases, favor atelectasis. No effusions or pneumothorax. No acute bony abnormality. IMPRESSION: Bibasilar atelectasis.  No active disease. Electronically Signed   By: Charlett Nose M.D.   On: 09/10/2023 19:37     Assessment and Plan:   1. Back pain with component of chest pain, minimal troponin elevation, known remote history of CAD, elevated BP with h/o HTN - symptoms generally atypical, would expect more pronounced EKG change or troponin elevation if persistent pain going on >1 week was cardiac in nature, but will review further with MD - last ischemic eval was at time of original cath 2008 by chart review - 2d echo pending  2. Paroxysmal atrial fibrillation - on chronic Tikosyn, warfarin, maintaning NSR with acceptable QTc - very brief episodes SVT +/- AF on telemetry, largely maintaining NSR  3. Elevated PA pressure 2021 - suspect r/t morbid obesity and OSA - repeat echo pending  4. Hyperlipidemia - patient said to be statin intolerant - IM has added statin, will defer rx plan to their team.  Risk Assessment/Risk Scores:          CHA2DS2-VASc Score = 5   This indicates a 7.2% annual risk of stroke. The patient's score is based upon: CHF History: 0 HTN History: 1 Diabetes History: 0 Stroke History: 0 Vascular Disease History: 1 Age Score: 2 Gender Score: 1         For questions or updates, please contact [facility] Please consult [email or web address] for contact info under     Signed, Laurann Montana, PA-C  09/12/2023 10:27 AM

## 2023-09-12 NOTE — Care Management Obs Status (Signed)
MEDICARE OBSERVATION STATUS NOTIFICATION   Patient Details  Name: [name] MRN: [id number] Date of Birth: 1945-12-13   Medicare Observation Status Notification Given:  Yes    Larrie Kass, LCSW 09/12/2023, 3:48 PM

## 2023-09-12 NOTE — Progress Notes (Signed)
Echocardiogram 2D Echocardiogram has been performed.  [name] 09/12/2023, 11:53 AM

## 2023-09-12 NOTE — Progress Notes (Signed)
PHARMACY - ANTICOAGULATION CONSULT NOTE  Pharmacy Consult for warfarin Indication: hx atrial fibrillation  Allergies  Allergen Reactions   Hydralazine Diarrhea and Nausea Only    Anorexia, upset stomach, burning pain in abdomin   Cefdinir     Pt cannot take; may interfere with AFib.   Codeine     Heart beats fast    Fosamax [Alendronate Sodium] Other (See Comments)    GI upset   Gluten Meal    Guaifenesin Er Nausea And Vomiting and Other (See Comments)    Headaches; can tolerate liquid.   Latex Other (See Comments)    Breathing problems   Percocet [Oxycodone-Acetaminophen]     Itching & swelling in jaw   Psyllium Other (See Comments)    Patient Measurements: Height: 5\' 3"  (160 cm) Weight: 111.5 kg (245 lb 13 oz) IBW/kg (Calculated) : 52.4 Heparin Dosing Weight:   Vital Signs: Temp: 97.6 F (36.4 C) (11/19 0832) Temp Source: Oral (11/19 0832) BP: 142/64 (11/19 0832) Pulse Rate: 66 (11/19 0832)  Labs: Recent Labs    09/10/23 1910 09/10/23 2134 09/10/23 2353 09/11/23 0805 09/12/23 0414  HGB 12.4  --   --   --  12.1  HCT 38.7  --   --   --  37.6  PLT 219  --   --   --  215  APTT  --   --   --   --  42*  LABPROT  --  27.6*  --  27.7* 24.4*  INR  --  2.5*  --  2.6* 2.2*  CREATININE  --  1.15*  --   --  0.96  TROPONINIHS  --  21* 25* 21*  --     Estimated Creatinine Clearance: 57.9 mL/min (by C-G formula based on SCr of 0.96 mg/dL).   Medical History: Past Medical History:  Diagnosis Date   Anxiety    Arthritis    Atrial fibrillation (HCC) 2008   CAD (coronary artery disease)    Crohn's colitis (HCC)    she reports ulcerative colitis beginning in her 49's, biopsies 2008 suggest Crohn's not UC   Depression    Diverticulosis    GERD (gastroesophageal reflux disease)    Hyperlipidemia    Hypertension    IBS (irritable bowel syndrome)    Keloid of skin    Obesity    OSA (obstructive sleep apnea)    Pancreatitis    Paroxysmal atrial fibrillation  (HCC)    Pseudoaneurysm of right femoral artery (HCC)    Renal oncocytoma    s/p right nephrectomy   Rheumatoid arthritis (HCC) 05/29/2017   UC (ulcerative colitis) (HCC)     Medications:  -  PTA warfarin regimen per [facility] clinic note on 08/29/23: 1.25mg  daily except 2.5mg  on MWF (last dose taken on 09/10/23)  Assessment: [name] with hx afib on warfarin PTA who presented to the ED on 09/10/23 with c/o chest/abdominal pain. Pharmacy has been consulted on 09/11/23 to resume warfarin for pt.   Today, 09/12/2023: - INR is therapeutic at 2.2 - cbc ok - no significant drug-drug intxns  Goal of Therapy:  INR 2-3 Monitor platelets by anticoagulation protocol: Yes   Plan:  Give warfarin 1.25 mg PO x1 today Monitor INR, CBC, s/s of bleed  [name] 09/12/2023,8:55 AM

## 2023-09-13 DIAGNOSIS — R0789 Other chest pain: Secondary | ICD-10-CM | POA: Diagnosis not present

## 2023-09-13 DIAGNOSIS — R079 Chest pain, unspecified: Secondary | ICD-10-CM | POA: Diagnosis not present

## 2023-09-13 DIAGNOSIS — I4819 Other persistent atrial fibrillation: Secondary | ICD-10-CM | POA: Diagnosis not present

## 2023-09-13 DIAGNOSIS — I10 Essential (primary) hypertension: Secondary | ICD-10-CM | POA: Diagnosis not present

## 2023-09-13 DIAGNOSIS — R7989 Other specified abnormal findings of blood chemistry: Secondary | ICD-10-CM | POA: Diagnosis not present

## 2023-09-13 LAB — CBC
HCT: 35.8 % — ABNORMAL LOW (ref 36.0–46.0)
Hemoglobin: 11.2 g/dL — ABNORMAL LOW (ref 12.0–15.0)
MCH: 32.1 pg (ref 26.0–34.0)
MCHC: 31.3 g/dL (ref 30.0–36.0)
MCV: 102.6 fL — ABNORMAL HIGH (ref 80.0–100.0)
Platelets: 210 10*3/uL (ref 150–400)
RBC: 3.49 MIL/uL — ABNORMAL LOW (ref 3.87–5.11)
RDW: 16.3 % — ABNORMAL HIGH (ref 11.5–15.5)
WBC: 7.6 10*3/uL (ref 4.0–10.5)
nRBC: 0 % (ref 0.0–0.2)

## 2023-09-13 LAB — PROTIME-INR
INR: 1.9 — ABNORMAL HIGH (ref 0.8–1.2)
Prothrombin Time: 21.7 s — ABNORMAL HIGH (ref 11.4–15.2)

## 2023-09-13 LAB — BASIC METABOLIC PANEL
Anion gap: 7 (ref 5–15)
BUN: 22 mg/dL (ref 8–23)
CO2: 24 mmol/L (ref 22–32)
Calcium: 8.7 mg/dL — ABNORMAL LOW (ref 8.9–10.3)
Chloride: 104 mmol/L (ref 98–111)
Creatinine, Ser: 1.03 mg/dL — ABNORMAL HIGH (ref 0.44–1.00)
GFR, Estimated: 56 mL/min — ABNORMAL LOW (ref >60)
Glucose, Bld: 101 mg/dL — ABNORMAL HIGH (ref 70–99)
Potassium: 4.2 mmol/L (ref 3.5–5.1)
Sodium: 135 mmol/L (ref 135–145)

## 2023-09-13 LAB — TSH: TSH: 5.693 u[IU]/mL — ABNORMAL HIGH (ref 0.350–4.500)

## 2023-09-13 MED ORDER — VALSARTAN 320 MG PO TABS: 320.0000 mg | ORAL_TABLET | Freq: Every day | ORAL | 3 refills | Status: DC

## 2023-09-13 MED ORDER — DOXAZOSIN MESYLATE 2 MG PO TABS: 2.0000 mg | ORAL_TABLET | Freq: Every day | ORAL | Status: DC

## 2023-09-13 MED ORDER — WARFARIN SODIUM 2.5 MG PO TABS: 2.5000 mg | ORAL_TABLET | Freq: Once | ORAL | Status: DC

## 2023-09-13 NOTE — Plan of Care (Signed)
  Problem: Clinical Measurements: Goal: Respiratory complications will improve Outcome: Progressing Goal: Cardiovascular complication will be avoided Outcome: Progressing   Problem: Activity: Goal: Risk for activity intolerance will decrease Outcome: Progressing   Problem: Nutrition: Goal: Adequate nutrition will be maintained Outcome: Progressing   Problem: Coping: Goal: Level of anxiety will decrease Outcome: Progressing   

## 2023-09-13 NOTE — Progress Notes (Addendum)
Progress Note  Patient Name: [name] Date of Encounter: 09/13/2023  Primary Cardiologist: Olga Millers, MD  Subjective   Denies any CP, back pain. She refused AM doxazosin dose because she said she only takes this at night. Had lightheaded spell this morning. Notes outline historical self-adjustment/splitting doses in the past as she seems to be very sensitive to side effects of medicine.  She uses CPAP but states her family observed that her O2 still dropped wearing this in the ER.  Inpatient Medications    Scheduled Meds:  amLODipine  10 mg Oral Daily   aspirin  81 mg Oral Daily   atorvastatin  40 mg Oral Daily   dofetilide  375 mcg Oral BID   doxazosin  4 mg Oral BID   escitalopram  10 mg Oral Daily   irbesartan  150 mg Oral BID   lidocaine  1 patch Transdermal Q24H   metoprolol tartrate  25 mg Oral BID   pantoprazole  40 mg Oral Daily   sodium chloride flush  3 mL Intravenous Q12H   Warfarin - Pharmacist Dosing Inpatient   Does not apply q1600   Continuous Infusions:  PRN Meds: acetaminophen, fentaNYL (SUBLIMAZE) injection, hydrALAZINE, nitroGLYCERIN, polyethylene glycol   Vital Signs    Vitals:   09/12/23 2243 09/13/23 0445 09/13/23 0628 09/13/23 0957  BP:  (!) 168/62 (!) 141/56 (!) 125/50  Pulse:  66 66 (!) 54  Resp: 20 18 15    Temp:  97.7 F (36.5 C)    TempSrc:  Oral    SpO2:  98%    Weight:      Height:        Intake/Output Summary (Last 24 hours) at 09/13/2023 1112 Last data filed at 09/12/2023 1700 Gross per 24 hour  Intake 480 ml  Output --  Net 480 ml      09/10/2023    6:55 PM 09/07/2023    9:00 AM 09/05/2023    9:55 PM  Last 3 Weights  Weight (lbs) 245 lb 13 oz 245 lb 12.8 oz 244 lb  Weight (kg) 111.5 kg 111.494 kg 110.678 kg     Telemetry    NSR, occ PACs, brief bursts SVT asymptomatic - Personally Reviewed  Physical Exam   GEN: No acute distress.  HEENT: Normocephalic, atraumatic, sclera non-icteric. Neck: No JVD  or bruits. Cardiac: RRR no murmurs, rubs, or gallops.  Respiratory: Clear to auscultation bilaterally. Breathing is unlabored. GI: Soft, nontender, non-distended, BS +x 4. MS: no deformity. Extremities: No clubbing or cyanosis. No edema. Distal pedal pulses are 2+ and equal bilaterally. Neuro:  AAOx3. Follows commands. Psych:  Responds to questions appropriately with a normal affect.  Labs    High Sensitivity Troponin:   Recent Labs  Lab 09/06/23 0247 09/06/23 0359 09/10/23 2134 09/10/23 2353 09/11/23 0805  TROPONINIHS 14 14 21* 25* 21*      Cardiac EnzymesNo results for input(s): "TROPONINI" in the last 168 hours. No results for input(s): "TROPIPOC" in the last 168 hours.   Chemistry Recent Labs  Lab 09/10/23 2134 09/12/23 0414 09/13/23 0430  NA 137 135 135  K 4.1 4.0 4.2  CL 105 102 104  CO2 23 25 24   GLUCOSE 105* 105* 101*  BUN 13 16 22   CREATININE 1.15* 0.96 1.03*  CALCIUM 8.8* 8.7* 8.7*  PROT 7.4  --   --   ALBUMIN 3.5  --   --   AST 25  --   --   ALT 20  --   --  ALKPHOS 83  --   --   BILITOT 0.7  --   --   GFRNONAA 49* >60 56*  ANIONGAP 9 8 7      Hematology Recent Labs  Lab 09/10/23 1910 09/12/23 0414 09/13/23 0605  WBC 10.0 7.6 7.6  RBC 3.93 3.70* 3.49*  HGB 12.4 12.1 11.2*  HCT 38.7 37.6 35.8*  MCV 98.5 101.6* 102.6*  MCH 31.6 32.7 32.1  MCHC 32.0 32.2 31.3  RDW 16.5* 16.5* 16.3*  PLT 219 215 210    BNPNo results for input(s): "BNP", "PROBNP" in the last 168 hours.   DDimer  Recent Labs  Lab 09/11/23 2208  DDIMER <0.27     Radiology    VAS [place] LOWER EXTREMITY VENOUS (DVT)  Result Date: 09/12/2023  Lower Venous DVT Study Patient Name:  [name] [facility]  Date of Exam:   09/12/2023 Medical Rec #: [id number]         Accession #:    [id number] Date of Birth: May 01, 1946        Patient Gender: F Patient Age:   77 years Exam Location:  [facility] Procedure:      VAS [place] LOWER EXTREMITY VENOUS (DVT) Referring Phys: [facility] GOEL  --------------------------------------------------------------------------------  Indications: Swelling, and Edema.  Risk Factors: Obesity and past pregnancy. Anticoagulation: Coumadin. Comparison Study: No significant changes seen since prior exam 09/05/12 Performing Technologist: Shona Simpson  Examination Guidelines: A complete evaluation includes B-mode imaging, spectral Doppler, color Doppler, and power Doppler as needed of all accessible portions of each vessel. Bilateral testing is considered an integral part of a complete examination. Limited examinations for reoccurring indications may be performed as noted. The reflux portion of the exam is performed with the patient in reverse Trendelenburg.  +---------+---------------+---------+-----------+----------+--------------+ RIGHT    CompressibilityPhasicitySpontaneityPropertiesThrombus Aging +---------+---------------+---------+-----------+----------+--------------+ CFV      Full           Yes      Yes                                 +---------+---------------+---------+-----------+----------+--------------+ SFJ      Full                                                        +---------+---------------+---------+-----------+----------+--------------+ FV Prox  Full                                                        +---------+---------------+---------+-----------+----------+--------------+ FV Mid   Full                                                        +---------+---------------+---------+-----------+----------+--------------+ FV DistalFull                                                        +---------+---------------+---------+-----------+----------+--------------+  PFV      Full                                                        +---------+---------------+---------+-----------+----------+--------------+ POP      Full           Yes      Yes                                  +---------+---------------+---------+-----------+----------+--------------+ PTV      Full                                                        +---------+---------------+---------+-----------+----------+--------------+ PERO     Full                                                        +---------+---------------+---------+-----------+----------+--------------+   +---------+---------------+---------+-----------+----------+--------------+ LEFT     CompressibilityPhasicitySpontaneityPropertiesThrombus Aging +---------+---------------+---------+-----------+----------+--------------+ CFV      Full           Yes      Yes                                 +---------+---------------+---------+-----------+----------+--------------+ SFJ      Full                                                        +---------+---------------+---------+-----------+----------+--------------+ FV Prox  Full                                                        +---------+---------------+---------+-----------+----------+--------------+ FV Mid   Full                                                        +---------+---------------+---------+-----------+----------+--------------+ FV DistalFull                                                        +---------+---------------+---------+-----------+----------+--------------+ PFV      Full                                                        +---------+---------------+---------+-----------+----------+--------------+  POP      Full           Yes      Yes                                 +---------+---------------+---------+-----------+----------+--------------+ PTV      Full                                                        +---------+---------------+---------+-----------+----------+--------------+ PERO     Full                                                         +---------+---------------+---------+-----------+----------+--------------+     Summary: BILATERAL: - No evidence of deep vein thrombosis seen in the lower extremities, bilaterally. -No evidence of popliteal cyst, bilaterally.   *See table(s) above for measurements and observations. Electronically signed by Coral Else MD on 09/12/2023 at 7:39:53 PM.    Final    ECHOCARDIOGRAM COMPLETE  Result Date: 09/12/2023    ECHOCARDIOGRAM REPORT   Patient Name:   [name] [facility] Date of Exam: 09/12/2023 Medical Rec #:  [id number]        Height:       63.0 in Accession #:    [id number]       Weight:       245.8 lb Date of Birth:  [birth year]       BSA:          2.111 m Patient Age:    16 years         BP:           142/64 mmHg Patient Gender: F                HR:           58 bpm. Exam Location:  Inpatient Procedure: 2D Echo, 3D Echo, Cardiac Doppler, Color Doppler and Strain Analysis Indications:    Chest Pain  History:        Patient has prior history of Echocardiogram examinations, most                 recent 09/10/2020. Risk Factors:Sleep Apnea, Dyslipidemia and                 Hypertension.  Sonographer:    Karma Ganja Referring Phys: [id number] Jon Billings A REGALADO  Sonographer Comments: Global longitudinal strain was attempted. IMPRESSIONS  1. Left ventricular ejection fraction, by estimation, is 70 to 75%. Left ventricular ejection fraction by 3D volume is 73 %. The left ventricle has hyperdynamic function. The left ventricle has no regional wall motion abnormalities. There is mild concentric left ventricular hypertrophy. Left ventricular diastolic parameters are consistent with Grade II diastolic dysfunction (pseudonormalization). Elevated left ventricular end-diastolic pressure. The average left ventricular global longitudinal strain is -17.4 %. The global longitudinal strain is normal.  2. Right ventricular systolic function is normal. The right ventricular size is moderately enlarged. There is mildly elevated  pulmonary artery systolic pressure. The estimated right ventricular systolic pressure is 44.0 mmHg.  3. Left atrial size was  severely dilated.  4. Right atrial size was mildly dilated.  5. The mitral valve is normal in structure. Trivial mitral valve regurgitation. No evidence of mitral stenosis.  6. The aortic valve is tricuspid. Aortic valve regurgitation is not visualized. Aortic valve sclerosis/calcification is present, without any evidence of aortic stenosis. Aortic valve area, by VTI measures 2.35 cm. Aortic valve mean gradient measures 8.0 mmHg. Aortic valve Vmax measures 1.75 m/s.  7. The inferior vena cava is normal in size with greater than 50% respiratory variability, suggesting right atrial pressure of 3 mmHg. FINDINGS  Left Ventricle: Left ventricular ejection fraction, by estimation, is 70 to 75%. Left ventricular ejection fraction by 3D volume is 73 %. The left ventricle has hyperdynamic function. The left ventricle has no regional wall motion abnormalities. The average left ventricular global longitudinal strain is -17.4 %. The global longitudinal strain is normal. The left ventricular internal cavity size was normal in size. There is mild concentric left ventricular hypertrophy. Left ventricular diastolic parameters are consistent with Grade II diastolic dysfunction (pseudonormalization). Elevated left ventricular end-diastolic pressure. Right Ventricle: The right ventricular size is moderately enlarged. No increase in right ventricular wall thickness. Right ventricular systolic function is normal. There is mildly elevated pulmonary artery systolic pressure. The tricuspid regurgitant velocity is 3.20 m/s, and with an assumed right atrial pressure of 3 mmHg, the estimated right ventricular systolic pressure is 44.0 mmHg. Left Atrium: Left atrial size was severely dilated. Right Atrium: Right atrial size was mildly dilated. Pericardium: There is no evidence of pericardial effusion. Mitral Valve: The  mitral valve is normal in structure. Trivial mitral valve regurgitation. No evidence of mitral valve stenosis. Tricuspid Valve: The tricuspid valve is normal in structure. Tricuspid valve regurgitation is mild . No evidence of tricuspid stenosis. Aortic Valve: The aortic valve is tricuspid. Aortic valve regurgitation is not visualized. Aortic valve sclerosis/calcification is present, without any evidence of aortic stenosis. Aortic valve mean gradient measures 8.0 mmHg. Aortic valve peak gradient measures 12.2 mmHg. Aortic valve area, by VTI measures 2.35 cm. Pulmonic Valve: The pulmonic valve was normal in structure. Pulmonic valve regurgitation is not visualized. No evidence of pulmonic stenosis. Aorta: The aortic root is normal in size and structure. Venous: The inferior vena cava is normal in size with greater than 50% respiratory variability, suggesting right atrial pressure of 3 mmHg. IAS/Shunts: No atrial level shunt detected by color flow Doppler.  LEFT VENTRICLE PLAX 2D LVIDd:         4.60 cm         Diastology LVIDs:         2.80 cm         LV e' medial:    6.31 cm/s LV PW:         1.20 cm         LV E/e' medial:  18.4 LV IVS:        1.20 cm         LV e' lateral:   6.64 cm/s LVOT diam:     1.90 cm         LV E/e' lateral: 17.5 LV SV:         111 LV SV Index:   53              2D LVOT Area:     2.84 cm        Longitudinal  Strain                                2D Strain GLS  -17.8 %                                (A2C):                                2D Strain GLS  -18.2 %                                (A3C):                                2D Strain GLS  -16.3 %                                (A4C):                                2D Strain GLS  -17.4 %                                Avg:                                 3D Volume EF                                LV 3D EF:    Left                                             ventricul                                              ar                                             ejection                                             fraction                                             by 3D  volume is                                             73 %.                                 3D Volume EF:                                3D EF:        73 %                                LV EDV:       151 ml                                LV ESV:       40 ml                                LV SV:        110 ml RIGHT VENTRICLE             IVC RV Basal diam:  4.80 cm     IVC diam: 1.50 cm RV S prime:     12.30 cm/s TAPSE (M-mode): 2.4 cm LEFT ATRIUM              Index        RIGHT ATRIUM           Index LA diam:        5.00 cm  2.37 cm/m   RA Area:     20.00 cm LA Vol (A2C):   92.9 ml  44.01 ml/m  RA Volume:   66.30 ml  31.41 ml/m LA Vol (A4C):   102.0 ml 48.32 ml/m LA Biplane Vol: 97.1 ml  46.00 ml/m  AORTIC VALVE AV Area (Vmax):    2.40 cm AV Area (Vmean):   2.26 cm AV Area (VTI):     2.35 cm AV Vmax:           175.00 cm/s AV Vmean:          132.000 cm/s AV VTI:            0.473 m AV Peak Grad:      12.2 mmHg AV Mean Grad:      8.0 mmHg LVOT Vmax:         148.00 cm/s LVOT Vmean:        105.000 cm/s LVOT VTI:          0.392 m LVOT/AV VTI ratio: 0.83  AORTA Ao Root diam: 2.80 cm Ao Asc diam:  2.20 cm MITRAL VALVE                TRICUSPID VALVE MV Area (PHT): 3.63 cm     TR Peak grad:   41.0 mmHg MV Decel Time: 209 msec     TR Vmax:        320.00 cm/s MV E velocity: 116.00 cm/s MV A velocity: 97.70 cm/s   SHUNTS MV E/A  ratio:  1.19         Systemic VTI:  0.39 m                             Systemic Diam: 1.90 cm Armanda Magic MD Electronically signed by Armanda Magic MD Signature Date/Time: 09/12/2023/12:23:13 PM    Final     Cardiac Studies   2d echo 09/12/23   1. Left ventricular ejection fraction, by estimation, is 70 to 75%. Left  ventricular ejection fraction by 3D volume is 73 %. The left ventricle  has  hyperdynamic function. The left ventricle has no regional wall motion  abnormalities. There is mild  concentric left ventricular hypertrophy. Left ventricular diastolic  parameters are consistent with Grade II diastolic dysfunction  (pseudonormalization). Elevated left ventricular end-diastolic pressure.  The average left ventricular global longitudinal  strain is -17.4 %. The global longitudinal strain is normal.   2. Right ventricular systolic function is normal. The right ventricular  size is moderately enlarged. There is mildly elevated pulmonary artery  systolic pressure. The estimated right ventricular systolic pressure is  44.0 mmHg.   3. Left atrial size was severely dilated.   4. Right atrial size was mildly dilated.   5. The mitral valve is normal in structure. Trivial mitral valve  regurgitation. No evidence of mitral stenosis.   6. The aortic valve is tricuspid. Aortic valve regurgitation is not  visualized. Aortic valve sclerosis/calcification is present, without any  evidence of aortic stenosis. Aortic valve area, by VTI measures 2.35 cm.  Aortic valve mean gradient measures  8.0 mmHg. Aortic valve Vmax measures 1.75 m/s.   7. The inferior vena cava is normal in size with greater than 50%  respiratory variability, suggesting right atrial pressure of 3 mmHg.    Patient Profile     77 y.o. 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 Recently seen in ED 11/13 with back pain, returned with continued back pain as  well as component of atypical chest pain prompting cardiology evaluation.  Assessment & Plan    1. Back pain with component of chest pain, minimal troponin elevation, known remote history of CAD, elevated BP with h/o HTN - last ischemic eval was at time of original cath 2008 - would expect more pronounced EKG change or troponin elevation if persistent pain going on >1 week was cardiac in nature. hsTroponin was low/flat, arguing against ACS - do not feel chest pain is cardiac in nature (migratory, focal) - 2D echo shows EF 70-75%, mild LVH, G2DD, elevated LVEDP, moderately enlarged RV with mild pHTN, severe LAE, mild RAE, trivial MR - Dr. Duke Salvia titrated doxazosin to 4mg  BID for improved BP control; patient refused this AM - otherwise on metoprolol 25mg  BID, amlodipine 10mg  daily, irbesartan 150mg  BID, not a candidate for thiazide diuretics with concomitant Tikosyn. BP 125/50 this AM - further mgmt of back pain per IM   2. Paroxysmal atrial fibrillation - on chronic Tikosyn + warfarin, acceptable QTc - very brief episodes SVT +/- AF on telemetry, largely maintaining NSR - Dr.  Camnitz previously acknowledged some breakthrough and did not feel candidate for ablation until BMI <40   3. Moderate pHTN - suspect r/t morbid obesity and OSA, question needing referral to pulm for nocturnal hypoxia despite CPAP - actually already has a follow-up appt with them 12/2. Cannot exclude component of chronic HFpEF, CXR without additional fluid. Volume status looks OK today on exam - can follow outpatient   4. Hyperlipidemia - patient said to be statin intolerant - IM has added statin, will defer rx plan to their team.  Tentatively had f/u in 2 days with APP in [place]. Patient wishes to push out. Rescheduled to 12/3. I will update AVS. Has Coumadin clinic 12/10, let us know if we need to adjust this date.  For questions or updates, please contact [facility] Please consult [email or web address] for contact  info under Cardiology/STEMI.  Signed, Laurann Montana, PA-C 09/13/2023, 11:12 AM

## 2023-09-13 NOTE — Progress Notes (Signed)
Mobility Specialist - Progress Note   09/13/23 1456  Mobility  Activity Ambulated with assistance in hallway  Level of Assistance Standby assist, set-up cues, supervision of patient - no hands on  Assistive Device Cane  Distance Ambulated (ft) 150 ft  Range of Motion/Exercises Active  Activity Response Tolerated well  $Mobility charge 1 Mobility  Mobility Specialist Start Time (ACUTE ONLY) 1415  Mobility Specialist Stop Time (ACUTE ONLY) 1430  Mobility Specialist Time Calculation (min) (ACUTE ONLY) 15 min   Received in chair and agreed to mobility. Had no issues throughout session. Returned to chair with all needs met.  Marilynne Halsted Mobility Specialist

## 2023-09-13 NOTE — Progress Notes (Signed)
PHARMACY - ANTICOAGULATION CONSULT NOTE  Pharmacy Consult for warfarin Indication: hx atrial fibrillation  Allergies  Allergen Reactions   Hydralazine Diarrhea and Nausea Only    Anorexia, upset stomach, burning pain in abdomin   Cefdinir     Pt cannot take; may interfere with AFib.   Codeine     Heart beats fast    Fosamax [Alendronate Sodium] Other (See Comments)    GI upset   Gluten Meal    Guaifenesin Er Nausea And Vomiting and Other (See Comments)    Headaches; can tolerate liquid.   Latex Other (See Comments)    Breathing problems   Percocet [Oxycodone-Acetaminophen]     Itching & swelling in jaw Pt said she can take tylenol   Psyllium Other (See Comments)    Patient Measurements: Height: 5\' 3"  (160 cm) Weight: 111.5 kg (245 lb 13 oz) IBW/kg (Calculated) : 52.4 Heparin Dosing Weight:   Vital Signs: Temp: 97.7 F (36.5 C) (11/20 0445) Temp Source: Oral (11/20 0445) BP: 125/50 (11/20 0957) Pulse Rate: 54 (11/20 0957)  Labs: Recent Labs    09/10/23 1910 09/10/23 2134 09/10/23 2134 09/10/23 2353 09/11/23 0805 09/12/23 0414 09/13/23 0430 09/13/23 0605  HGB 12.4  --   --   --   --  12.1  --  11.2*  HCT 38.7  --   --   --   --  37.6  --  35.8*  PLT 219  --   --   --   --  215  --  210  APTT  --   --   --   --   --  42*  --   --   LABPROT  --  27.6*   < >  --  27.7* 24.4* 21.7*  --   INR  --  2.5*   < >  --  2.6* 2.2* 1.9*  --   CREATININE  --  1.15*  --   --   --  0.96 1.03*  --   TROPONINIHS  --  21*  --  25* 21*  --   --   --    < > = values in this interval not displayed.    Estimated Creatinine Clearance: 54 mL/min (A) (by C-G formula based on SCr of 1.03 mg/dL (H)).   Medical History: Past Medical History:  Diagnosis Date   Anxiety    Arthritis    Atrial fibrillation (HCC) 2008   CAD (coronary artery disease)    Crohn's colitis (HCC)    she reports ulcerative colitis beginning in her 70's, biopsies 2008 suggest Crohn's not UC   Depression     Diverticulosis    GERD (gastroesophageal reflux disease)    Hyperlipidemia    Hypertension    IBS (irritable bowel syndrome)    Keloid of skin    Obesity    OSA (obstructive sleep apnea)    Pancreatitis    Paroxysmal atrial fibrillation (HCC)    Pseudoaneurysm of right femoral artery (HCC)    Renal oncocytoma    s/p right nephrectomy   Rheumatoid arthritis (HCC) 05/29/2017   UC (ulcerative colitis) (HCC)     Medications:  -  PTA warfarin regimen per [facility] clinic note on 08/29/23: 1.25mg  daily except 2.5mg  on MWF (last dose taken on 09/10/23)  Assessment: [name] with hx afib on warfarin PTA who presented to the ED on 09/10/23 with c/o chest/abdominal pain. Pharmacy has been consulted on 09/11/23 to  resume warfarin for pt.   Today, 09/13/2023: - INR is slightly subtherapeutic at 1.9 - cbc ok - no significant drug-drug intxns  Goal of Therapy:  INR 2-3 Monitor platelets by anticoagulation protocol: Yes   Plan:  Give warfarin 2.5 mg PO x1 today Monitor INR, CBC, s/s of bleed   Adalberto Cole, PharmD, BCPS 09/13/2023 11:13 AM

## 2023-09-13 NOTE — Discharge Summary (Signed)
Physician Discharge Summary   [name]:[id number] DOB: 01/21/1946 DOA: 09/10/2023  PCP: Bradd Canary, MD  Admit date: 09/10/2023 Discharge date:  09/13/2023  Admitted From: Home Disposition:  Home Discharging physician: Lewie Chamber, MD Barriers to discharge: none  Recommendations at discharge: Follow up with cardiology Left ovarian vein phlebolith noted on CT; may need follow up MRI  Discharge Condition: stable CODE STATUS: Full Diet recommendation:  Diet Orders (From admission, onward)     Start     Ordered   09/13/23 0000  Diet - low sodium heart healthy        09/13/23 1439   09/11/23 1552  Diet regular Room service appropriate? Yes; Fluid consistency: Thin  Diet effective now       Question Answer Comment  Room service appropriate? Yes   Fluid consistency: Thin      09/11/23 1552            Hospital Course:  Chest pain -resolved -Low concern for ischemia on workup - Echo also obtained which showed normal EF, 7075% and no RWMA.  Grade 2 diastolic dysfunction -Also evaluated by cardiology, appreciate assistance   HTN:  -Valsartan increased to 320 mg at discharge   Left ovarian vein phlebolith Left Ovarian vein phlebolith seen on CT.  She needs outpatient gynecology follow-up and MRI abdomen   Rheumatoid arthritis: -On weekly methotrexate and infliximab   A-fib: -Continue with Tikosyn and lopressor - continue coumadin    Obesity: -BMI 43, Morbid Obesity   The patient's acute and chronic medical conditions were treated accordingly. On day of discharge, patient was felt deemed stable for discharge. Patient/family member advised to call PCP or come back to ER if needed.   Principal Diagnosis: Chest pain  Discharge Diagnoses: Active Hospital Problems   Diagnosis Date Noted   Chest pain 09/11/2023   Elevated troponin 09/12/2023   Ovarian mass 09/11/2023   Rheumatoid arthritis (HCC) 05/29/2017   Afib (HCC) 01/15/2009   Obesity  01/03/2008    Resolved Hospital Problems  No resolved problems to display.     Discharge Instructions     Diet - low sodium heart healthy   Complete by: As directed    Increase activity slowly   Complete by: As directed       Allergies as of 09/13/2023       Reactions   Hydralazine Diarrhea, Nausea Only   Anorexia, upset stomach, burning pain in abdomin   Cefdinir    Pt cannot take; may interfere with AFib.   Codeine    Heart beats fast   Fosamax [alendronate Sodium] Other (See Comments)   GI upset   Gluten Meal    Guaifenesin Er Nausea And Vomiting, Other (See Comments)   Headaches; can tolerate liquid.   Latex Other (See Comments)   Breathing problems   Percocet [oxycodone-acetaminophen]    Itching & swelling in jaw Pt said she can take tylenol   Psyllium Other (See Comments)        Medication List     STOP taking these medications    HYDROcodone-acetaminophen 5-325 MG tablet Commonly known as: NORCO/VICODIN       TAKE these medications    amLODipine 10 MG tablet Commonly known as: NORVASC TAKE 1 TABLET BY MOUTH EVERY DAY   ciclopirox 8 % solution Commonly known as: PENLAC Apply topically at bedtime. Apply over nail and surrounding skin. Apply daily over previous coat. After seven (7) days, may remove with alcohol and continue  cycle.   dextrose 5 % SOLN 50 mL with methotrexate 25 MG/ML (PF) SOLN 40 mg/m2 Inject 40 mg/m2 into the vein once a week. Every wednesday   dofetilide 125 MCG capsule Commonly known as: TIKOSYN TAKE 3 CAPSULES BY MOUTH TWICE A DAY   doxazosin 2 MG tablet Commonly known as: CARDURA TAKE 1 TABLET BY MOUTH EVERY DAY   escitalopram 10 MG tablet Commonly known as: LEXAPRO TAKE 1 TABLET BY MOUTH EVERY DAY   esomeprazole 40 MG capsule Commonly known as: NexIUM Take 1 capsule (40 mg total) by mouth daily. Take in the morning before breakfast.   folic acid 1 MG tablet Commonly known as: FOLVITE Take 2 mg by mouth  daily.   inFLIXimab 10 mg/kg in sodium chloride 0.9 % Inject 10 mg/kg into the vein every 6 (six) weeks.   loperamide 2 MG tablet Commonly known as: IMODIUM A-D Take 1 mg by mouth as needed. Dihrrhea   metoprolol tartrate 25 MG tablet Commonly known as: LOPRESSOR TAKE 1 TABLET (25 MG) BY MOUTH IN THE MORNING AND AT BEDTIME   multivitamin capsule Take 1 capsule by mouth daily. 1 daily   nitroGLYCERIN 0.4 MG SL tablet Commonly known as: NITROSTAT Place 1 tablet (0.4 mg total) under the tongue every 5 (five) minutes as needed for chest pain. Do not exceed 3 tabs in 15 minutes   [facility] Caps Take 1 capsule by mouth daily. 1 per day   tiZANidine 2 MG tablet Commonly known as: ZANAFLEX Take 0.5-1 tablets (1-2 mg total) by mouth every 8 (eight) hours as needed for muscle spasms.   TUBERCULIN SYR 1CC/27GX1/2" 27G X 1/2" 1 ML Misc   valsartan 320 MG tablet Commonly known as: DIOVAN Take 1 tablet (320 mg total) by mouth daily. What changed:  medication strength how much to take   warfarin 2.5 MG tablet Commonly known as: COUMADIN Take as directed. If you are unsure how to take this medication, talk to your nurse or doctor. Original instructions: TAKE 1/2 TABLET TO 1 TABLET BY MOUTH DAILY AS DIRECTED BY COUMADIN CLINIC What changed: See the new instructions.        Follow-up Information     Reather Littler D, NP Follow up.   Specialty: Cardiology Why: [facility] - [facility] location - we moved your follow-up appointment from November 22 to Tuesday Sep 26, 2023 at 10:30 AM. Arrive 15 minutes prior to appointment to check in. Contact information: [address] [place] [address] [phone]                Allergies  Allergen Reactions   Hydralazine Diarrhea and Nausea Only    Anorexia, upset stomach, burning pain in abdomin   Cefdinir     Pt cannot take; may interfere with AFib.   Codeine     Heart beats fast    Fosamax  [Alendronate Sodium] Other (See Comments)    GI upset   Gluten Meal    Guaifenesin Er Nausea And Vomiting and Other (See Comments)    Headaches; can tolerate liquid.   Latex Other (See Comments)    Breathing problems   Percocet [Oxycodone-Acetaminophen]     Itching & swelling in jaw Pt said she can take tylenol   Psyllium Other (See Comments)    Consultations: Cardiology  Procedures:   Discharge Exam: BP 128/68   Pulse 61   Temp 97.7 F (36.5 C) (Oral)   Resp 15   Ht 5\' 3"  (  1.6 m)   Wt 111.5 kg   SpO2 99%   BMI 43.54 kg/m  Physical Exam Constitutional:      Appearance: Normal appearance.  HENT:     Head: Normocephalic and atraumatic.     Mouth/Throat:     Mouth: Mucous membranes are moist.  Eyes:     Extraocular Movements: Extraocular movements intact.  Cardiovascular:     Rate and Rhythm: Normal rate and regular rhythm.  Pulmonary:     Effort: Pulmonary effort is normal. No respiratory distress.     Breath sounds: Normal breath sounds. No wheezing.  Abdominal:     General: Bowel sounds are normal. There is no distension.     Palpations: Abdomen is soft.     Tenderness: There is no abdominal tenderness.  Musculoskeletal:        General: Normal range of motion.     Cervical back: Normal range of motion and neck supple.  Skin:    General: Skin is warm and dry.  Neurological:     General: No focal deficit present.     Mental Status: She is alert.  Psychiatric:        Mood and Affect: Mood normal.      The results of significant diagnostics from this hospitalization (including imaging, microbiology, ancillary and laboratory) are listed below for reference.   Microbiology: No results found for this or any previous visit (from the past 240 hour(s)).   Labs: BNP (last 3 results) No results for input(s): "BNP" in the last 8760 hours. Basic Metabolic Panel: Recent Labs  Lab 09/10/23 2134 09/12/23 0414 09/13/23 0430  NA 137 135 135  K 4.1 4.0 4.2  CL  105 102 104  CO2 23 25 24   GLUCOSE 105* 105* 101*  BUN 13 16 22   CREATININE 1.15* 0.96 1.03*  CALCIUM 8.8* 8.7* 8.7*   Liver Function Tests: Recent Labs  Lab 09/10/23 2134  AST 25  ALT 20  ALKPHOS 83  BILITOT 0.7  PROT 7.4  ALBUMIN 3.5   No results for input(s): "LIPASE", "AMYLASE" in the last 168 hours. No results for input(s): "AMMONIA" in the last 168 hours. CBC: Recent Labs  Lab 09/10/23 1910 09/12/23 0414 09/13/23 0605  WBC 10.0 7.6 7.6  NEUTROABS 7.6  --   --   HGB 12.4 12.1 11.2*  HCT 38.7 37.6 35.8*  MCV 98.5 101.6* 102.6*  PLT 219 215 210   Cardiac Enzymes: No results for input(s): "CKTOTAL", "CKMB", "CKMBINDEX", "TROPONINI" in the last 168 hours. BNP: Invalid input(s): "POCBNP" CBG: No results for input(s): "GLUCAP" in the last 168 hours. D-Dimer Recent Labs    09/11/23 2208  DDIMER <0.27   Hgb A1c No results for input(s): "HGBA1C" in the last 72 hours. Lipid Profile Recent Labs    09/12/23 0414  CHOL 135  HDL 59  LDLCALC 67  TRIG 47  CHOLHDL 2.3   Thyroid function studies Recent Labs    09/13/23 0605  TSH 5.693*   Anemia work up No results for input(s): "VITAMINB12", "FOLATE", "FERRITIN", "TIBC", "IRON", "RETICCTPCT" in the last 72 hours. Urinalysis    Component Value Date/Time   COLORURINE YELLOW 09/10/2023 2208   APPEARANCEUR CLEAR 09/10/2023 2208   LABSPEC 1.010 09/10/2023 2208   PHURINE 6.0 09/10/2023 2208   GLUCOSEU NEGATIVE 09/10/2023 2208   HGBUR TRACE (A) 09/10/2023 2208   BILIRUBINUR NEGATIVE 09/10/2023 2208   KETONESUR NEGATIVE 09/10/2023 2208   PROTEINUR NEGATIVE 09/10/2023 2208   UROBILINOGEN 0.2 04/21/2014 1115  NITRITE NEGATIVE 09/10/2023 2208   LEUKOCYTESUR NEGATIVE 09/10/2023 2208   Sepsis Labs Recent Labs  Lab 09/10/23 1910 09/12/23 0414 09/13/23 0605  WBC 10.0 7.6 7.6   Microbiology No results found for this or any previous visit (from the past 240 hour(s)).  Procedures/Studies: VAS [place] LOWER  EXTREMITY VENOUS (DVT)  Result Date: 09/12/2023  Lower Venous DVT Study Patient Name:  AMORAH TELFORD [facility]  Date of Exam:   09/12/2023 Medical Rec #: [id number]         Accession #:    [id number] Date of Birth: 1945/11/16        Patient Gender: F Patient Age:   77 years Exam Location:  [facility] Procedure:      VAS [place] LOWER EXTREMITY VENOUS (DVT) Referring Phys: [facility] GOEL --------------------------------------------------------------------------------  Indications: Swelling, and Edema.  Risk Factors: Obesity and past pregnancy. Anticoagulation: Coumadin. Comparison Study: No significant changes seen since prior exam 09/05/12 Performing Technologist: Shona Simpson  Examination Guidelines: A complete evaluation includes B-mode imaging, spectral Doppler, color Doppler, and power Doppler as needed of all accessible portions of each vessel. Bilateral testing is considered an integral part of a complete examination. Limited examinations for reoccurring indications may be performed as noted. The reflux portion of the exam is performed with the patient in reverse Trendelenburg.  +---------+---------------+---------+-----------+----------+--------------+ RIGHT    CompressibilityPhasicitySpontaneityPropertiesThrombus Aging +---------+---------------+---------+-----------+----------+--------------+ CFV      Full           Yes      Yes                                 +---------+---------------+---------+-----------+----------+--------------+ SFJ      Full                                                        +---------+---------------+---------+-----------+----------+--------------+ FV Prox  Full                                                        +---------+---------------+---------+-----------+----------+--------------+ FV Mid   Full                                                        +---------+---------------+---------+-----------+----------+--------------+ FV  DistalFull                                                        +---------+---------------+---------+-----------+----------+--------------+ PFV      Full                                                        +---------+---------------+---------+-----------+----------+--------------+ POP  Full           Yes      Yes                                 +---------+---------------+---------+-----------+----------+--------------+ PTV      Full                                                        +---------+---------------+---------+-----------+----------+--------------+ PERO     Full                                                        +---------+---------------+---------+-----------+----------+--------------+   +---------+---------------+---------+-----------+----------+--------------+ LEFT     CompressibilityPhasicitySpontaneityPropertiesThrombus Aging +---------+---------------+---------+-----------+----------+--------------+ CFV      Full           Yes      Yes                                 +---------+---------------+---------+-----------+----------+--------------+ SFJ      Full                                                        +---------+---------------+---------+-----------+----------+--------------+ FV Prox  Full                                                        +---------+---------------+---------+-----------+----------+--------------+ FV Mid   Full                                                        +---------+---------------+---------+-----------+----------+--------------+ FV DistalFull                                                        +---------+---------------+---------+-----------+----------+--------------+ PFV      Full                                                        +---------+---------------+---------+-----------+----------+--------------+ POP      Full           Yes      Yes                                  +---------+---------------+---------+-----------+----------+--------------+  PTV      Full                                                        +---------+---------------+---------+-----------+----------+--------------+ PERO     Full                                                        +---------+---------------+---------+-----------+----------+--------------+     Summary: BILATERAL: - No evidence of deep vein thrombosis seen in the lower extremities, bilaterally. -No evidence of popliteal cyst, bilaterally.   *See table(s) above for measurements and observations. Electronically signed by Coral Else MD on 09/12/2023 at 7:39:53 PM.    Final    ECHOCARDIOGRAM COMPLETE  Result Date: 09/12/2023    ECHOCARDIOGRAM REPORT   Patient Name:   CARREY VITTITOE [facility] Date of Exam: 09/12/2023 Medical Rec #:  [id number]        Height:       63.0 in Accession #:    [id number]       Weight:       245.8 lb Date of Birth:  [birth year]       BSA:          2.111 m Patient Age:    77 years         BP:           142/64 mmHg Patient Gender: F                HR:           58 bpm. Exam Location:  Inpatient Procedure: 2D Echo, 3D Echo, Cardiac Doppler, Color Doppler and Strain Analysis Indications:    Chest Pain  History:        Patient has prior history of Echocardiogram examinations, most                 recent 09/10/2020. Risk Factors:Sleep Apnea, Dyslipidemia and                 Hypertension.  Sonographer:    Karma Ganja Referring Phys: [id number] Jon Billings A REGALADO  Sonographer Comments: Global longitudinal strain was attempted. IMPRESSIONS  1. Left ventricular ejection fraction, by estimation, is 70 to 75%. Left ventricular ejection fraction by 3D volume is 73 %. The left ventricle has hyperdynamic function. The left ventricle has no regional wall motion abnormalities. There is mild concentric left ventricular hypertrophy. Left ventricular diastolic parameters are consistent with Grade II diastolic  dysfunction (pseudonormalization). Elevated left ventricular end-diastolic pressure. The average left ventricular global longitudinal strain is -17.4 %. The global longitudinal strain is normal.  2. Right ventricular systolic function is normal. The right ventricular size is moderately enlarged. There is mildly elevated pulmonary artery systolic pressure. The estimated right ventricular systolic pressure is 44.0 mmHg.  3. Left atrial size was severely dilated.  4. Right atrial size was mildly dilated.  5. The mitral valve is normal in structure. Trivial mitral valve regurgitation. No evidence of mitral stenosis.  6. The aortic valve is tricuspid. Aortic valve regurgitation is not visualized. Aortic valve sclerosis/calcification is present, without any evidence of aortic stenosis. Aortic valve area, by VTI measures  2.35 cm. Aortic valve mean gradient measures 8.0 mmHg. Aortic valve Vmax measures 1.75 m/s.  7. The inferior vena cava is normal in size with greater than 50% respiratory variability, suggesting right atrial pressure of 3 mmHg. FINDINGS  Left Ventricle: Left ventricular ejection fraction, by estimation, is 70 to 75%. Left ventricular ejection fraction by 3D volume is 73 %. The left ventricle has hyperdynamic function. The left ventricle has no regional wall motion abnormalities. The average left ventricular global longitudinal strain is -17.4 %. The global longitudinal strain is normal. The left ventricular internal cavity size was normal in size. There is mild concentric left ventricular hypertrophy. Left ventricular diastolic parameters are consistent with Grade II diastolic dysfunction (pseudonormalization). Elevated left ventricular end-diastolic pressure. Right Ventricle: The right ventricular size is moderately enlarged. No increase in right ventricular wall thickness. Right ventricular systolic function is normal. There is mildly elevated pulmonary artery systolic pressure. The tricuspid  regurgitant velocity is 3.20 m/s, and with an assumed right atrial pressure of 3 mmHg, the estimated right ventricular systolic pressure is 44.0 mmHg. Left Atrium: Left atrial size was severely dilated. Right Atrium: Right atrial size was mildly dilated. Pericardium: There is no evidence of pericardial effusion. Mitral Valve: The mitral valve is normal in structure. Trivial mitral valve regurgitation. No evidence of mitral valve stenosis. Tricuspid Valve: The tricuspid valve is normal in structure. Tricuspid valve regurgitation is mild . No evidence of tricuspid stenosis. Aortic Valve: The aortic valve is tricuspid. Aortic valve regurgitation is not visualized. Aortic valve sclerosis/calcification is present, without any evidence of aortic stenosis. Aortic valve mean gradient measures 8.0 mmHg. Aortic valve peak gradient measures 12.2 mmHg. Aortic valve area, by VTI measures 2.35 cm. Pulmonic Valve: The pulmonic valve was normal in structure. Pulmonic valve regurgitation is not visualized. No evidence of pulmonic stenosis. Aorta: The aortic root is normal in size and structure. Venous: The inferior vena cava is normal in size with greater than 50% respiratory variability, suggesting right atrial pressure of 3 mmHg. IAS/Shunts: No atrial level shunt detected by color flow Doppler.  LEFT VENTRICLE PLAX 2D LVIDd:         4.60 cm         Diastology LVIDs:         2.80 cm         LV e' medial:    6.31 cm/s LV PW:         1.20 cm         LV E/e' medial:  18.4 LV IVS:        1.20 cm         LV e' lateral:   6.64 cm/s LVOT diam:     1.90 cm         LV E/e' lateral: 17.5 LV SV:         111 LV SV Index:   53              2D LVOT Area:     2.84 cm        Longitudinal                                Strain                                2D Strain GLS  -17.8 %                                (  A2C):                                2D Strain GLS  -18.2 %                                (A3C):                                2D Strain  GLS  -16.3 %                                (A4C):                                2D Strain GLS  -17.4 %                                Avg:                                 3D Volume EF                                LV 3D EF:    Left                                             ventricul                                             ar                                             ejection                                             fraction                                             by 3D                                             volume is                                             73 %.  3D Volume EF:                                3D EF:        73 %                                LV EDV:       151 ml                                LV ESV:       40 ml                                LV SV:        110 ml RIGHT VENTRICLE             IVC RV Basal diam:  4.80 cm     IVC diam: 1.50 cm RV S prime:     12.30 cm/s TAPSE (M-mode): 2.4 cm LEFT ATRIUM              Index        RIGHT ATRIUM           Index LA diam:        5.00 cm  2.37 cm/m   RA Area:     20.00 cm LA Vol (A2C):   92.9 ml  44.01 ml/m  RA Volume:   66.30 ml  31.41 ml/m LA Vol (A4C):   102.0 ml 48.32 ml/m LA Biplane Vol: 97.1 ml  46.00 ml/m  AORTIC VALVE AV Area (Vmax):    2.40 cm AV Area (Vmean):   2.26 cm AV Area (VTI):     2.35 cm AV Vmax:           175.00 cm/s AV Vmean:          132.000 cm/s AV VTI:            0.473 m AV Peak Grad:      12.2 mmHg AV Mean Grad:      8.0 mmHg LVOT Vmax:         148.00 cm/s LVOT Vmean:        105.000 cm/s LVOT VTI:          0.392 m LVOT/AV VTI ratio: 0.83  AORTA Ao Root diam: 2.80 cm Ao Asc diam:  2.20 cm MITRAL VALVE                TRICUSPID VALVE MV Area (PHT): 3.63 cm     TR Peak grad:   41.0 mmHg MV Decel Time: 209 msec     TR Vmax:        320.00 cm/s MV E velocity: 116.00 cm/s MV A velocity: 97.70 cm/s   SHUNTS MV E/A ratio:  1.19         Systemic VTI:  0.39 m                              Systemic Diam: 1.90 cm Armanda Magic MD Electronically signed by Armanda Magic MD Signature Date/Time: 09/12/2023/12:23:13 PM    Final    CT ABDOMEN PELVIS W CONTRAST  Result Date: 09/10/2023 CLINICAL DATA:  Increase in left lower quadrant pain EXAM: CT ABDOMEN AND PELVIS WITH CONTRAST TECHNIQUE: Multidetector CT imaging of the abdomen and pelvis was performed using the standard protocol following bolus administration of intravenous contrast. RADIATION DOSE REDUCTION: This exam was performed according to the departmental dose-optimization program which includes automated exposure control, adjustment of the mA and/or kV according to patient size and/or use of iterative reconstruction technique. CONTRAST:  OMNIPAQUE IOHEXOL 300 MG/ML  SOLN COMPARISON:  09/06/2023 FINDINGS: Lower chest: No acute abnormality. Hepatobiliary: No focal liver abnormality is seen. Status post cholecystectomy. No biliary dilatation. Pancreas: Unremarkable. No pancreatic ductal dilatation or surrounding inflammatory changes. Spleen: Normal in size without focal abnormality. Adrenals/Urinary Tract: Adrenal glands are within normal limits. 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 The previously seen perfusion defect in the left kidney is no longer identified. Normal excretion is noted. No renal calculi are seen. Previously described left ureteral stone actually represents a left ovarian vein phlebolith and is stable in appearance. The ureter on the left is unremarkable. Bladder is well distended. Stomach/Bowel: Scattered diverticular change of the colon is noted without evidence of diverticulitis. The appendix is within normal limits. Small bowel and stomach are unremarkable. Vascular/Lymphatic: Aortic atherosclerosis. No enlarged abdominal or pelvic lymph nodes. Reproductive: Uterus and bilateral adnexa are unremarkable. Other: No abdominal wall hernia or abnormality. No abdominopelvic ascites. Musculoskeletal: No acute or  significant osseous findings. IMPRESSION: No acute abnormality noted. Stable diverticulosis without diverticulitis. Previously described left ureteral stone actually represents a left ovarian vein phlebolith. No obstructive changes seen. Resolution of previously seen perfusion defect in the left kidney. Electronically Signed   By: Alcide Clever M.D.   On: 09/10/2023 23:33   DG Chest 2 View  Result Date: 09/10/2023 CLINICAL DATA:  Mid back pain EXAM: CHEST - 2 VIEW COMPARISON:  05/23/2023 FINDINGS: Heart and mediastinal contours are within normal limits. Increased markings in the lung bases, favor atelectasis. No effusions or pneumothorax. No acute bony abnormality. IMPRESSION: Bibasilar atelectasis.  No active disease. Electronically Signed   By: Charlett Nose M.D.   On: 09/10/2023 19:37   CT HEAD WO CONTRAST ( )  Result Date: 09/07/2023 CLINICAL DATA:  Headache, increasing frequency or severity. Headache for 1.5 years. EXAM: CT HEAD WITHOUT CONTRAST TECHNIQUE: Contiguous axial images were obtained from the base of the skull through the vertex without intravenous contrast. RADIATION DOSE REDUCTION: This exam was performed according to the departmental dose-optimization program which includes automated exposure control, adjustment of the mA and/or kV according to patient size and/or use of iterative reconstruction technique. COMPARISON:  CT head without contrast 07/18/2011 FINDINGS: Brain: No acute infarct, hemorrhage, or mass lesion is present. Progressive moderate periventricular white matter hypoattenuation is present bilaterally. Deep brain nuclei are within normal limits. The ventricles are of normal size. No significant extraaxial fluid collection is present. The brainstem and cerebellum are within normal limits. Midline structures are within normal limits. Vascular: Atherosclerotic calcifications are present within the cavernous internal carotid arteries bilaterally. No hyperdense vessel is present.  Skull: Calvarium is intact. No focal lytic or blastic lesions are present. No significant extracranial soft tissue lesion is present. Sinuses/Orbits: Secretions are noted in posterior ethmoid air cells bilaterally and in the left sphenoid sinus. Scattered mucosal thickening is present within anterior ethmoid air cells. The paranasal sinuses and mastoid air cells are otherwise clear. IMPRESSION: 1. No acute intracranial abnormality. 2. Progressive moderate periventricular white matter hypoattenuation bilaterally. This likely reflects the sequela of chronic microvascular ischemia. 3. Secretions in  posterior ethmoid air cells bilaterally and in the left sphenoid sinus. Correlate for signs and symptoms of acute sinusitis. Electronically Signed   By: Marin Roberts M.D.   On: 09/07/2023 09:47   CT Angio Chest/Abd/Pel for Dissection W and/or W/WO  Result Date: 09/06/2023 CLINICAL DATA:  77 year old female with history of back pain radiating into the chest. Suspected acute aortic syndrome. Additional history of Crohn's disease and diverticulitis. EXAM: CT ANGIOGRAPHY CHEST, ABDOMEN AND PELVIS TECHNIQUE: Non-contrast CT of the chest was initially obtained. Multidetector CT imaging through the chest, abdomen and pelvis was performed using the standard protocol during bolus administration of intravenous contrast. Multiplanar reconstructed images and MIPs were obtained and reviewed to evaluate the vascular anatomy. RADIATION DOSE REDUCTION: This exam was performed according to the departmental dose-optimization program which includes automated exposure control, adjustment of the mA and/or kV according to patient size and/or use of iterative reconstruction technique. CONTRAST:  OMNIPAQUE IOHEXOL 350 MG/ML SOLN COMPARISON:  CT of the abdomen and pelvis 04/01/2022. No prior chest CT. FINDINGS: CTA CHEST FINDINGS Cardiovascular: Precontrast images demonstrate no crescentic high attenuation associated with the wall  of the thoracic aorta to suggest the presence of acute intramural hemorrhage. Postcontrast images demonstrate no evidence of thoracic aortic aneurysm or dissection. Thoracic aorta is normal in caliber measuring 3.1 cm, 2.6 cm and 2.4 cm in diameter in the ascending thoracic aorta, mid aortic arch and descending thoracic aorta respectively. There is aortic atherosclerosis, as well as atherosclerosis of the great vessels of the mediastinum and the coronary arteries, including calcified atherosclerotic plaque in the left anterior descending coronary artery. Mediastinum/Nodes: No pathologically enlarged mediastinal or hilar lymph nodes. Small hiatal hernia. No axillary lymphadenopathy. Lungs/Pleura: Small calcified granulomas are noted in the lungs. No other suspicious appearing pulmonary nodules or masses are noted. No acute consolidative airspace disease. No pleural effusions. Musculoskeletal: There are no aggressive appearing lytic or blastic lesions noted in the visualized portions of the skeleton. Review of the MIP images confirms the above findings. CTA ABDOMEN AND PELVIS FINDINGS VASCULAR Aorta: Normal caliber aorta without aneurysm, dissection, vasculitis or significant stenosis. Celiac: Patent without evidence of aneurysm, dissection, vasculitis or significant stenosis. SMA: Patent without evidence of aneurysm, dissection, vasculitis or significant stenosis. Renals: The left renal artery is patent without evidence of aneurysm, dissection, vasculitis, fibromuscular dysplasia or significant stenosis. Right renal artery is ligated (patient is status post right nephrectomy). IMA: Patent without evidence of aneurysm, dissection, vasculitis or significant stenosis. Inflow: Patent without evidence of aneurysm, dissection, vasculitis or significant stenosis. Veins: No obvious venous abnormality within the limitations of this arterial phase study. Review of the MIP images confirms the above findings. NON-VASCULAR  Hepatobiliary: Liver has a shrunken appearance and nodular contour, indicative of underlying cirrhosis. No discrete cystic or solid hepatic lesions. No intra or extrahepatic biliary ductal dilatation. Status post cholecystectomy. Pancreas: No pancreatic mass. No pancreatic ductal dilatation. No pancreatic or peripancreatic fluid collections or inflammatory changes. Spleen: Unremarkable. Adrenals/Urinary Tract: Status post right radical nephrectomy. Poorly defined 2.5 x 2.3 x 2.6 cm hypovascular area in the upper pole of the left kidney (axial image 104 of series 302 and coronal image 86 of series 601) is considered indeterminate. On some images, this appears somewhat wedge-shaped, indicating possible infarct. Bilateral adrenal glands are normal in appearance. 4 mm calculus in the middle third of the left ureter (axial image 151 of series 302 and coronal image 107 of series 601), without associated proximal hydroureteronephrosis to indicate obstruction at this  time. Urinary bladder is unremarkable in appearance. Stomach/Bowel: The appearance of the stomach is normal. There is no pathologic dilatation of small bowel or colon. Numerous colonic diverticuli are noted, without surrounding inflammatory changes to indicate an acute diverticulitis at this time. Normal appendix. Lymphatic: No lymphadenopathy noted in the abdomen or pelvis. Reproductive: Uterus and ovaries are unremarkable in appearance. Other: No significant volume of ascites.  No pneumoperitoneum. Musculoskeletal: There are no aggressive appearing lytic or blastic lesions noted in the visualized portions of the skeleton. Review of the MIP images confirms the above findings. IMPRESSION: 1. No evidence of acute aortic syndrome. 2. 4 mm nonobstructive calculus in the middle third of the left ureter without proximal hydroureteronephrosis at this time. 3. Indeterminate hypovascular area in the upper pole of the left kidney. On some images this appears somewhat  wedge-shaped, which could indicate an infarct, however, the possibility of an infiltrative neoplasm is not excluded. Follow-up nonemergent outpatient abdominal MRI with and without IV gadolinium is recommended in the near future to better evaluate this finding and exclude neoplasm. 4. Aortic atherosclerosis, in addition to left anterior descending coronary artery disease. Assessment for potential risk factor modification, dietary therapy or pharmacologic therapy may be warranted, if clinically indicated. 5. Colonic diverticulosis without evidence of acute diverticulitis at this time. 6. Morphologic changes in the liver suggestive of underlying cirrhosis. No suspicious hepatic lesions are noted. 7. Additional incidental findings, as above. Electronically Signed   By: Trudie Reed M.D.   On: 09/06/2023 05:29   DG Thoracic Spine 2 View  Result Date: 09/06/2023 CLINICAL DATA:  Midthoracic back pain since last night EXAM: THORACIC SPINE 2 VIEWS COMPARISON:  None Available. FINDINGS: No evidence of acute fracture or traumatic listhesis. Demineralization. Multilevel age-related spondylosis IMPRESSION: No acute fracture or traumatic listhesis. Electronically Signed   By: Minerva Fester M.D.   On: 09/06/2023 00:18   MM 3D SCREENING MAMMOGRAM BILATERAL BREAST  Result Date: 08/23/2023 CLINICAL DATA:  Screening. EXAM: DIGITAL SCREENING BILATERAL MAMMOGRAM WITH TOMOSYNTHESIS AND CAD TECHNIQUE: Bilateral screening digital craniocaudal and mediolateral oblique mammograms were obtained. Bilateral screening digital breast tomosynthesis was performed. The images were evaluated with computer-aided detection. COMPARISON:  Previous exam(s). ACR Breast Density Category b: There are scattered areas of fibroglandular density. FINDINGS: There are no findings suspicious for malignancy. IMPRESSION: No mammographic evidence of malignancy. A result letter of this screening mammogram will be mailed directly to the patient.  RECOMMENDATION: Screening mammogram in one year. (Code:SM-B-01Y) BI-RADS CATEGORY  1: Negative. Electronically Signed   By: Meda Klinefelter M.D.   On: 08/23/2023 12:51     Time coordinating discharge: Over 30 minutes    Lewie Chamber, MD  [facility] 09/13/2023, 4:10 PM

## 2023-09-14 ENCOUNTER — Ambulatory Visit (HOSPITAL_COMMUNITY)
Admission: RE | Admit: 2023-09-14 | Discharge: 2023-09-14 | Disposition: A | Discharge status: Home / Self Care | Payer: Medicare Other | Source: Ambulatory Visit | Attending: Family Medicine | Admitting: Family Medicine

## 2023-09-14 DIAGNOSIS — N2889 Other specified disorders of kidney and ureter: Secondary | ICD-10-CM | POA: Diagnosis not present

## 2023-09-14 DIAGNOSIS — Z9049 Acquired absence of other specified parts of digestive tract: Secondary | ICD-10-CM | POA: Diagnosis not present

## 2023-09-14 DIAGNOSIS — Z905 Acquired absence of kidney: Secondary | ICD-10-CM | POA: Diagnosis not present

## 2023-09-14 MED ORDER — GADOBUTROL 1 MMOL/ML IV SOLN
10.0000 mL | Freq: Once | INTRAVENOUS | Status: AC | PRN
  Administered 2023-09-14: 10 mL via INTRAVENOUS

## 2023-09-15 ENCOUNTER — Ambulatory Visit: Payer: Medicare Other | Admitting: Physician Assistant

## 2023-09-15 ENCOUNTER — Encounter: Payer: Self-pay | Admitting: Family Medicine

## 2023-09-17 ENCOUNTER — Encounter: Payer: Self-pay | Admitting: Cardiology

## 2023-09-19 ENCOUNTER — Telehealth: Payer: Self-pay | Admitting: Family Medicine

## 2023-09-19 DIAGNOSIS — Z7901 Long term (current) use of anticoagulants: Secondary | ICD-10-CM | POA: Diagnosis not present

## 2023-09-19 DIAGNOSIS — G8929 Other chronic pain: Secondary | ICD-10-CM | POA: Diagnosis not present

## 2023-09-19 DIAGNOSIS — R739 Hyperglycemia, unspecified: Secondary | ICD-10-CM | POA: Diagnosis not present

## 2023-09-19 DIAGNOSIS — M549 Dorsalgia, unspecified: Secondary | ICD-10-CM | POA: Diagnosis not present

## 2023-09-19 DIAGNOSIS — F419 Anxiety disorder, unspecified: Secondary | ICD-10-CM | POA: Diagnosis not present

## 2023-09-19 DIAGNOSIS — M359 Systemic involvement of connective tissue, unspecified: Secondary | ICD-10-CM | POA: Diagnosis not present

## 2023-09-19 DIAGNOSIS — N201 Calculus of ureter: Secondary | ICD-10-CM | POA: Diagnosis not present

## 2023-09-19 DIAGNOSIS — Z6841 Body Mass Index (BMI) 40.0 and over, adult: Secondary | ICD-10-CM | POA: Diagnosis not present

## 2023-09-19 DIAGNOSIS — I10 Essential (primary) hypertension: Secondary | ICD-10-CM | POA: Diagnosis not present

## 2023-09-19 DIAGNOSIS — E559 Vitamin D deficiency, unspecified: Secondary | ICD-10-CM | POA: Diagnosis not present

## 2023-09-19 DIAGNOSIS — M0589 Other rheumatoid arthritis with rheumatoid factor of multiple sites: Secondary | ICD-10-CM | POA: Diagnosis not present

## 2023-09-19 DIAGNOSIS — I480 Paroxysmal atrial fibrillation: Secondary | ICD-10-CM | POA: Diagnosis not present

## 2023-09-19 DIAGNOSIS — F32A Depression, unspecified: Secondary | ICD-10-CM | POA: Diagnosis not present

## 2023-09-19 DIAGNOSIS — U071 COVID-19: Secondary | ICD-10-CM | POA: Diagnosis not present

## 2023-09-19 DIAGNOSIS — I252 Old myocardial infarction: Secondary | ICD-10-CM | POA: Diagnosis not present

## 2023-09-19 DIAGNOSIS — E7800 Pure hypercholesterolemia, unspecified: Secondary | ICD-10-CM | POA: Diagnosis not present

## 2023-09-19 DIAGNOSIS — I2510 Atherosclerotic heart disease of native coronary artery without angina pectoris: Secondary | ICD-10-CM | POA: Diagnosis not present

## 2023-09-19 NOTE — Telephone Encounter (Signed)
Called and left voicemail message for April to proceed with verbal order

## 2023-09-19 NOTE — Telephone Encounter (Signed)
April with [facility] called to request verbal orders for physical therapy with the frequency of 1 week 1, 2 week 3, and 1 week 5. Please call and advise at [phone].

## 2023-09-23 NOTE — Progress Notes (Unsigned)
Cardiology Office Note    Date:  09/23/2023  ID:  ENSLIE SHANHOLTZ, DOB [birth year], MRN [id number] PCP:  Bradd Canary, MD  Cardiologist:  Olga Millers, MD  Electrophysiologist:  Will Jorja Loa, MD   Chief Complaint: Hospital follow up   History of Present Illness: .    [name] is a 77 y.o. 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   Cardaic catheterization in 02/2027 indicated nonobstructive plaque in the LAD.  EF at the time was 55%.  The left circumflex and right coronary artery were normal.  Per Dr. Jens Som she had a previous MI that was felt to be secondary to a first diagonal branch occlusion.  In September 2012 she had a workup for pheochromocytoma that was negative. 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  January 2018 she wore a cardiac event monitor that showed sinus with PACs, PVCs and ectopic atrial tachycardia.  She has known history of atrial fibrillation, maintained in normal sinus rhythm on Tikosyn with occasional breakthrough.  In 10/2016 she was found to be in atrial fibrillation, prior to DCCV she self converted back to normal sinus rhythm, this occurred has occurred intermittently in the past few years.    On 01/26/2023 she presented to the emergency room for chest discomfort and palpitations.  She was in atrial fibrillation.  She underwent DCCV with successful conversion to normal sinus rhythm with a single shock.  It was noted that she had started a Medrol Dosepak  the day prior to her presenting in the emergency room.  She was discharged in stable condition with recommended follow-up with cardiology. On 05/23/23 she presented to [facility] [place] with palpitations. She was found to be in atrial fibrillation. She woke up that morning feeling as though her heart was racing, she was otherwise asymptomatic. She reported compliance with her Tikosyn and metoprolol, although she did not think she had taken her Coumadin the the prior five/six days. Cardiology was consulted, recommended rate control and follow up outpatient.   She was seen in general cardiology clinic on 05/31/2023, it was noted that she had been consistently in atrial fibrillation for the past week as she been monitoring on her Kardia mobile.  Her EKG at visit indicated sinus bradycardia at 55 bpm.  She was seen by Dr. Elberta Fortis on 07/03/2023, unfortunately given her BMI she was not a candidate for ablation.  Medications continued.  Earlier in the fall she had COVID, noted that she felt it knocked her back a bit.  She recently had an outpatient CT head done for headache which showed sinus disease and chronic microvascular ischemia.  She was seen in the ED on 09/06/2023 with back pain.  CTA showed no dissection, positive ureteral stone, indeterminate hypovascular area of left kidney pole of undetermined significance, aortic/coronary atherosclerosis, diverticulosis and possible morphologic changes of cirrhosis.  Workup was overall reassuring and she was discharged home.  She returned to the hospital in 09/10/2023 with continued back pain with component of chest discomfort.  It was noted that her back pain/chest pain was preceded by  some left shoulder pain that occurred with specific movements like lifting her arm, moving it forward, bending her shoulder.  In addition to migrating back pain she also noted associated migrating chest pain that was not clearly associated with exertion, would occur at random but did seem  provoked by certain movements.  She took Tylenol and a muscle relaxer at home that seem to improve her pain.  Labs showed low level troponin elevation of 25 --> 21, D-dimer was negative, INR 2.5.  Echocardiogram on 09/12/2023 indicated LVEF 70 to 75%, no RWMA, mild concentric LVH, grade 2 diastolic dysfunction, RV systolic function normal, RV moderately enlarged, mildly elevated pulmonary artery systolic pressure, LA severely dilated, RA mildly dilated  Today she reports    Back pain/Chest pain: Presented to the emergency room on 09/10/2023 with back pain with component of chest discomfort.  Felt to not be cardiac in nature given it was migratory and focal.  High sensitive troponin was low/flat.  2D echo showed EF 70 to 75%, mild LVH, G2 DD, elevated LVEDP, moderately enlarged RV with mild PHTN. Today she reports  Continue metoprolol 25 mg twice daily, amlodipine 10 mg daily, irbesartan 150 mg twice daily, not a candidate for thiazide diuretics with Tikosyn.  Paroxysmal atrial fibrillation: On chronic Tikosyn, warfarin EKG today  Not candidate per ablation given BMI.   Moderate pHTN/OSA: Echo during admission indicated moderately enlarged RV with mild PHTN.  Suspect this is related to morbid obesity and OSA.  She follows with pulmonology regarding nocturnal hypoxia despite CPAP. Possible component of HFpEF?   Hyperlipidemia: Last lipid profile on 09/12/2023 indicated total cholesterol 135, triglycerides 47, HDL 59 and LDL 67.  Carotid artery disease: Last carotid duplex on 08/2020 indicated 1 to 39% ICA stenosis bilaterally.  She has been asymptomatic, repeat if clinically indicated.    Labwork independently reviewed:   ROS: .   *** denies chest pain, shortness of breath, lower extremity edema, fatigue, palpitations, melena, hematuria, hemoptysis, diaphoresis, weakness, presyncope, syncope, orthopnea, and PND. .  All other systems are reviewed and otherwise negative.  Studies Reviewed: Marland Kitchen     EKG:  EKG is ordered today, personally reviewed, demonstrating ***     CV Studies:  Cardiac Studies & Procedures       ECHOCARDIOGRAM  ECHOCARDIOGRAM COMPLETE 09/12/2023  Narrative ECHOCARDIOGRAM REPORT    Patient Name:   [name] [facility] Date of Exam: 09/12/2023 Medical Rec #:  [id number]        Height:       63.0 in Accession #:    [id number]       Weight:       245.8 lb Date of Birth:  09-30-1946       BSA:          2.111 m Patient Age:    73 years         BP:           142/64 mmHg Patient Gender: F                HR:           58 bpm. Exam Location:  Inpatient  Procedure: 2D Echo, 3D Echo, Cardiac Doppler, Color Doppler and Strain Analysis  Indications:    Chest Pain  History:        Patient has prior history of Echocardiogram examinations, most recent 09/10/2020. Risk Factors:Sleep Apnea, Dyslipidemia and Hypertension.  Sonographer:    Karma Ganja Referring Phys: [id number] Jon Billings A REGALADO  Sonographer Comments: Global longitudinal strain was attempted. IMPRESSIONS   1. Left ventricular ejection fraction, by estimation, is 70 to 75%. Left ventricular ejection fraction by 3D volume is 73 %. The left ventricle has hyperdynamic function. The left ventricle has no regional wall motion abnormalities. There is mild concentric left ventricular hypertrophy. Left ventricular diastolic parameters are consistent with Grade II diastolic dysfunction (pseudonormalization). Elevated left ventricular end-diastolic pressure. The average left ventricular global longitudinal strain is -17.4 %. The global longitudinal strain is normal. 2. Right ventricular systolic function is normal. The right ventricular size is moderately enlarged. There is mildly elevated pulmonary artery systolic pressure. The estimated right ventricular systolic pressure is 44.0 mmHg. 3. Left atrial size was severely dilated. 4. Right atrial size was mildly dilated. 5. The mitral valve is normal in structure.  Trivial mitral valve regurgitation. No evidence of mitral stenosis. 6. The aortic valve is tricuspid. Aortic valve regurgitation is not visualized. Aortic valve sclerosis/calcification is present, without any evidence of aortic stenosis. Aortic valve area, by VTI measures 2.35 cm. Aortic valve mean gradient measures 8.0 mmHg. Aortic valve Vmax measures 1.75 m/s. 7. The inferior vena cava is normal in size with greater than 50% respiratory variability, suggesting right atrial pressure of 3 mmHg.  FINDINGS Left Ventricle: Left ventricular ejection fraction, by estimation, is 70 to 75%. Left ventricular ejection fraction by 3D volume is 73 %. The left ventricle has hyperdynamic function. The left ventricle has no regional wall motion abnormalities. The average left ventricular global longitudinal strain is -17.4 %. The global longitudinal strain is normal. The left ventricular internal cavity size was normal in size. There is mild concentric left ventricular hypertrophy. Left ventricular diastolic parameters are consistent with Grade II diastolic dysfunction (pseudonormalization). Elevated left ventricular end-diastolic pressure.  Right Ventricle: The right ventricular size is moderately enlarged. No increase in right ventricular wall thickness. Right ventricular systolic function is normal. There is mildly elevated pulmonary artery systolic pressure. The tricuspid regurgitant velocity is 3.20 m/s, and with an assumed right atrial pressure of 3 mmHg, the estimated right ventricular systolic pressure is 44.0 mmHg.  Left Atrium: Left atrial size was severely dilated.  Right Atrium: Right atrial size was mildly dilated.  Pericardium: There is no evidence of pericardial effusion.  Mitral Valve: The mitral valve is normal in structure. Trivial mitral valve regurgitation. No evidence of mitral valve stenosis.  Tricuspid Valve: The tricuspid valve is normal in structure. Tricuspid valve regurgitation is  mild . No evidence of tricuspid stenosis.  Aortic Valve: The aortic valve is tricuspid. Aortic valve regurgitation is not visualized. Aortic valve sclerosis/calcification is present, without any evidence of aortic stenosis. Aortic valve mean gradient measures 8.0 mmHg. Aortic valve peak gradient measures 12.2 mmHg. Aortic valve area, by VTI measures 2.35 cm.  Pulmonic Valve: The pulmonic valve was normal in structure. Pulmonic valve regurgitation is not visualized. No evidence of pulmonic stenosis.  Aorta: The aortic root is normal in size and structure.  Venous: The inferior vena cava is normal in size with greater than 50% respiratory variability, suggesting right atrial pressure of 3 mmHg.  IAS/Shunts: No atrial level shunt detected by color flow Doppler.   LEFT VENTRICLE PLAX 2D LVIDd:         4.60 cm         Diastology LVIDs:         2.80 cm         LV e' medial:    6.31 cm/s LV PW:  1.20 cm         LV E/e' medial:  18.4 LV IVS:        1.20 cm         LV e' lateral:   6.64 cm/s LVOT diam:     1.90 cm         LV E/e' lateral: 17.5 LV SV:         111 LV SV Index:   53              2D LVOT Area:     2.84 cm        Longitudinal Strain 2D Strain GLS  -17.8 % (A2C): 2D Strain GLS  -18.2 % (A3C): 2D Strain GLS  -16.3 % (A4C): 2D Strain GLS  -17.4 % Avg:  3D Volume EF LV 3D EF:    Left ventricul ar ejection fraction by 3D volume is 73 %.  3D Volume EF: 3D EF:        73 % LV EDV:       151 ml LV ESV:       40 ml LV SV:        110 ml  RIGHT VENTRICLE             IVC RV Basal diam:  4.80 cm     IVC diam: 1.50 cm RV S prime:     12.30 cm/s TAPSE (M-mode): 2.4 cm  LEFT ATRIUM              Index        RIGHT ATRIUM           Index LA diam:        5.00 cm  2.37 cm/m   RA Area:     20.00 cm LA Vol (A2C):   92.9 ml  44.01 ml/m  RA Volume:   66.30 ml  31.41 ml/m LA Vol (A4C):   102.0 ml 48.32 ml/m LA Biplane Vol: 97.1 ml  46.00 ml/m AORTIC VALVE AV Area  (Vmax):    2.40 cm AV Area (Vmean):   2.26 cm AV Area (VTI):     2.35 cm AV Vmax:           175.00 cm/s AV Vmean:          132.000 cm/s AV VTI:            0.473 m AV Peak Grad:      12.2 mmHg AV Mean Grad:      8.0 mmHg LVOT Vmax:         148.00 cm/s LVOT Vmean:        105.000 cm/s LVOT VTI:          0.392 m LVOT/AV VTI ratio: 0.83  AORTA Ao Root diam: 2.80 cm Ao Asc diam:  2.20 cm  MITRAL VALVE                TRICUSPID VALVE MV Area (PHT): 3.63 cm     TR Peak grad:   41.0 mmHg MV Decel Time: 209 msec     TR Vmax:        320.00 cm/s MV E velocity: 116.00 cm/s MV A velocity: 97.70 cm/s   SHUNTS MV E/A ratio:  1.19         Systemic VTI:  0.39 m Systemic Diam: 1.90 cm  Armanda Magic MD Electronically signed by Armanda Magic MD Signature Date/Time: 09/12/2023/12:23:13 PM    Final    MONITORS  CARDIAC EVENT MONITOR 11/08/2016  Narrative Sinus with pacs, pvcs and ectopic atrial tachycardia Olga Millers             Current Reported Medications:.    No outpatient medications have been marked as taking for the 09/26/23 encounter (Appointment) with Rip Harbour, NP.    Physical Exam:    VS:  There were no vitals taken for this visit.   Wt Readings from Last 3 Encounters:  09/10/23 245 lb 13 oz (111.5 kg)  09/07/23 245 lb 12.8 oz (111.5 kg)  09/05/23 244 lb (110.7 kg)    GEN: Well nourished, well developed in no acute distress NECK: No JVD; No carotid bruits CARDIAC: ***RRR, no murmurs, rubs, gallops RESPIRATORY:  Clear to auscultation without rales, wheezing or rhonchi  ABDOMEN: Soft, non-tender, non-distended EXTREMITIES:  No edema; No acute deformity   Asessement and Plan:.     ***     Disposition: F/u with ***  Signed, Rip Harbour, NP

## 2023-09-25 ENCOUNTER — Ambulatory Visit: Payer: Medicare Other | Admitting: Adult Health

## 2023-09-25 ENCOUNTER — Encounter: Payer: Self-pay | Admitting: Adult Health

## 2023-09-25 ENCOUNTER — Encounter: Payer: Self-pay | Admitting: Family Medicine

## 2023-09-25 VITALS — BP 126/70 | HR 61 | Ht 63.0 in | Wt 245.8 lb

## 2023-09-25 DIAGNOSIS — G4733 Obstructive sleep apnea (adult) (pediatric): Secondary | ICD-10-CM

## 2023-09-25 DIAGNOSIS — M069 Rheumatoid arthritis, unspecified: Secondary | ICD-10-CM

## 2023-09-25 NOTE — Assessment & Plan Note (Signed)
Work on healthy weight loss 

## 2023-09-25 NOTE — Patient Instructions (Signed)
Continue on CPAP At bedtime   Keep up good work  Do not drive if sleepy.  Work on healthy weight  Follow up with Dr. Vassie Loll or Abhijay Morriss NP   In 1 year and As needed   Please contact office for sooner follow up if symptoms do not improve or worsen or seek emergency care

## 2023-09-25 NOTE — Progress Notes (Signed)
@Patient  ID: [name], female    DOB: 1945-12-23, 77 y.o.   MRN: [id number]  Chief Complaint  Patient presents with   Consult    Referring provider: Bradd Canary, MD  HPI: 77 year old female followed for severe sleep apnea on CPAP.  Presents September 25, 2023 to reestablish for sleep apnea-last seen July 2020 Medical history significant for coronary artery disease, rheumatoid arthritis  TEST/EVENTS :  PSG 2008 >. AHI 36/h Auto 2012 - optimal pr 12 cm , FF mask  09/25/2023 Follow up : OSA  Patient presents for a follow-up visit.  She is here to reestablish for sleep apnea.  She was last seen July 2020.  Patient has severe obstructive sleep apnea.  Is on CPAP at bedtime.  Says she wears her CPAP every single night.  Cannot sleep without it.  She feels that she benefits from CPAP.  CPAP download shows excellent compliance with daily average usage at 7.5 hours.  She is on CPAP 12 cm H2O.  AHI 0.9/hour. She is using a full facemask. She does not use any sleep aids. Admitted to hospital last month for chest pain. Echo showed EF at 70-75%, Gr 2 DD.  CT chest 09/06/23 with no acute process.  Had Covid 19 infection in October 2024.  Feels that she has not fully recovered.  Since discharge patient is feeling better but continues to have chronic neck pain back and shoulder pain.  She does have a follow-up with rheumatology this month.  She does have rheumatoid arthritis and is on Infliximab.    Past Surgical History:  Procedure Laterality Date   ANGIOPLASTY     BREAST BIOPSY  10/24/1994   benign lesion    Cataract surgery Left 11/13/2014   CHOLECYSTECTOMY  10/24/2006   COLONOSCOPY W/ BIOPSIES     CORONARY ANGIOPLASTY     ESOPHAGOGASTRODUODENOSCOPY     NEPHRECTOMY  10/24/2006   right secondary to oncocytoma    RIGHT FEMORAL  PSEUDOANEURYSM & RIGHT GROIN HEMATOMA EVACUATION  02/22/2007   RIGHT RENAL ARTERY REPAIR  10/24/1974   TUBAL LIGATION         Allergies  Allergen  Reactions   Hydralazine Diarrhea and Nausea Only    Anorexia, upset stomach, burning pain in abdomin   Cefdinir     Pt cannot take; may interfere with AFib.   Codeine     Heart beats fast    Fosamax [Alendronate Sodium] Other (See Comments)    GI upset   Gluten Meal    Guaifenesin Er Nausea And Vomiting and Other (See Comments)    Headaches; can tolerate liquid.   Latex Other (See Comments)    Breathing problems   Percocet [Oxycodone-Acetaminophen]     Itching & swelling in jaw Pt said she can take tylenol   Psyllium Other (See Comments)    Immunization History  Administered Date(s) Administered   Fluad Quad(high Dose 65+) 09/12/2019, 08/03/2021, 08/15/2022   Fluad Trivalent(High Dose 65+) 09/07/2023   Influenza Split 07/23/2012   Influenza Whole 09/06/2007, 08/11/2008, 08/24/2009, 08/03/2010   Influenza, High Dose Seasonal PF 08/02/2013, 06/29/2016, 07/06/2017, 07/31/2018   Influenza,inj,Quad PF,6+ Mos 06/27/2014, 06/26/2015   Moderna Sars-Covid-2 Vaccination 12/25/2019, 01/22/2020   Pneumococcal Conjugate-13 08/28/2014   Pneumococcal Polysaccharide-23 09/13/2010   Td 05/12/2009   Zoster, Live 02/11/2008    Past Medical History:  Diagnosis Date   Anxiety    Arthritis    Atrial fibrillation (HCC) 2008   CAD (coronary artery disease)  Crohn's colitis (HCC)    she reports ulcerative colitis beginning in her 29's, biopsies 2008 suggest Crohn's not UC   Depression    Diverticulosis    GERD (gastroesophageal reflux disease)    Hyperlipidemia    Hypertension    IBS (irritable bowel syndrome)    Keloid of skin    Obesity    OSA (obstructive sleep apnea)    Pancreatitis    Paroxysmal atrial fibrillation (HCC)    Pseudoaneurysm of right femoral artery (HCC)    Renal oncocytoma    s/p right nephrectomy   Rheumatoid arthritis (HCC) 05/29/2017   UC (ulcerative colitis) (HCC)     Tobacco History: Social History   Tobacco Use  Smoking Status Never  Smokeless  Tobacco Never   Counseling given: Not Answered   Outpatient Medications Prior to Visit  Medication Sig Dispense Refill   amLODipine (NORVASC) 10 MG tablet TAKE 1 TABLET BY MOUTH EVERY DAY 90 tablet 2   ciclopirox (PENLAC) 8 % solution Apply topically at bedtime. Apply over nail and surrounding skin. Apply daily over previous coat. After seven (7) days, may remove with alcohol and continue cycle. 6.6 mL 0   dextrose 5 % SOLN 50 mL with methotrexate 25 MG/ML (PF) SOLN 40 mg/m2 Inject 40 mg/m2 into the vein once a week. Every wednesday     dofetilide (TIKOSYN) 125 MCG capsule TAKE 3 CAPSULES BY MOUTH TWICE A DAY 540 capsule 1   doxazosin (CARDURA) 2 MG tablet TAKE 1 TABLET BY MOUTH EVERY DAY 90 tablet 2   escitalopram (LEXAPRO) 10 MG tablet TAKE 1 TABLET BY MOUTH EVERY DAY 90 tablet 1   esomeprazole (NEXIUM) 40 MG capsule Take 1 capsule (40 mg total) by mouth daily. Take in the morning before breakfast. 30 capsule 11   folic acid (FOLVITE) 1 MG tablet Take 2 mg by mouth daily.     inFLIXimab 10 mg/kg in sodium chloride 0.9 % Inject 10 mg/kg into the vein every 6 (six) weeks.     loperamide (IMODIUM A-D) 2 MG tablet Take 1 mg by mouth as needed. Dihrrhea     metoprolol tartrate (LOPRESSOR) 25 MG tablet TAKE 1 TABLET (25 MG) BY MOUTH IN THE MORNING AND AT BEDTIME 180 tablet 3   Multiple Vitamin (MULTIVITAMIN) capsule Take 1 capsule by mouth daily. 1 daily     nitroGLYCERIN (NITROSTAT) 0.4 MG SL tablet Place 1 tablet (0.4 mg total) under the tongue every 5 (five) minutes as needed for chest pain. Do not exceed 3 tabs in 15 minutes 75 tablet 3   Probiotic Product (PHILLIPS COLON HEALTH) CAPS Take 1 capsule by mouth daily. 1 per day     TUBERCULIN SYR 1CC/27GX1/2" 27G X 1/2" 1 ML MISC      valsartan (DIOVAN) 320 MG tablet Take 1 tablet (320 mg total) by mouth daily. 30 tablet 3   warfarin (COUMADIN) 2.5 MG tablet TAKE 1/2 TABLET TO 1 TABLET BY MOUTH DAILY AS DIRECTED BY COUMADIN CLINIC (Patient taking  differently: Take 2.5 mg by mouth See admin instructions. TAKE 1/2 TABLET TO 1 TABLET BY MOUTH DAILY AS DIRECTED BY COUMADIN CLINIC) 75 tablet 1   tiZANidine (ZANAFLEX) 2 MG tablet Take 0.5-1 tablets (1-2 mg total) by mouth every 8 (eight) hours as needed for muscle spasms. 40 tablet 1   No facility-administered medications prior to visit.     Review of Systems:   Constitutional:   No  weight loss, night sweats,  Fevers, chills, +fatigue, or  lassitude.  HEENT:   No headaches,  Difficulty swallowing,  Tooth/dental problems, or  Sore throat,                No sneezing, itching, ear ache, nasal congestion, post nasal drip,   CV:  No chest pain,  Orthopnea, PND, swelling in lower extremities, anasarca, dizziness, palpitations, syncope.   GI  No heartburn, indigestion, abdominal pain, nausea, vomiting, diarrhea, change in bowel habits, loss of appetite, bloody stools.   Resp: No shortness of breath with exertion or at rest.  No excess mucus, no productive cough,  No non-productive cough,  No coughing up of blood.  No change in color of mucus.  No wheezing.  No chest wall deformity  Skin: no rash or lesions.  GU: no dysuria, change in color of urine, no urgency or frequency.  No flank pain, no hematuria   MS:  No joint pain or swelling.  No decreased range of motion.  No back pain.    Physical Exam  BP 126/70 (BP Location: Left Arm, Patient Position: Sitting, Cuff Size: Normal)   Pulse 61   Ht 5\' 3"  (1.6 m)   Wt 245 lb 12.8 oz (111.5 kg)   SpO2 97%   BMI 43.54 kg/m   GEN: A/Ox3; pleasant , NAD, well nourished    HEENT:  [place]/AT, NOSE-clear, THROAT-clear, no lesions, no postnasal drip or exudate noted.   NECK:  Supple w/ fair ROM; no JVD; normal carotid impulses w/o bruits; no thyromegaly or nodules palpated; no lymphadenopathy.    RESP  Clear  P & A; w/o, wheezes/ rales/ or rhonchi. no accessory muscle use, no dullness to percussion  CARD:  RRR, no m/r/g, no peripheral edema,  pulses intact, no cyanosis or clubbing.  GI:   Soft & nt; nml bowel sounds; no organomegaly or masses detected.   Musco: Warm bil, no deformities or joint swelling noted.   Neuro: alert, no focal deficits noted.    Skin: Warm, no lesions or rashes    Lab Results:  CBC    Component Value Date/Time   WBC 7.6 09/13/2023 0605   RBC 3.49 (L) 09/13/2023 0605   HGB 11.2 (L) 09/13/2023 0605   HGB 12.2 08/19/2020 1030   HCT 35.8 (L) 09/13/2023 0605   HCT 36.3 08/19/2020 1030   PLT 210 09/13/2023 0605   PLT 190 08/19/2020 1030   MCV 102.6 (H) 09/13/2023 0605   MCV 99 (H) 08/19/2020 1030   MCH 32.1 09/13/2023 0605   MCHC 31.3 09/13/2023 0605   RDW 16.3 (H) 09/13/2023 0605   RDW 14.1 08/19/2020 1030   LYMPHSABS 1.4 09/10/2023 1910   MONOABS 0.9 09/10/2023 1910   EOSABS 0.1 09/10/2023 1910   BASOSABS 0.0 09/10/2023 1910    BMET    Component Value Date/Time   NA 135 09/13/2023 0430   NA 141 12/28/2022 1308   K 4.2 09/13/2023 0430   CL 104 09/13/2023 0430   CO2 24 09/13/2023 0430   GLUCOSE 101 (H) 09/13/2023 0430   BUN 22 09/13/2023 0430   BUN 15 12/28/2022 1308   CREATININE 1.03 (H) 09/13/2023 0430   CREATININE 1.25 (H) 11/14/2016 1554   CALCIUM 8.7 (L) 09/13/2023 0430   GFRNONAA 56 (L) 09/13/2023 0430   GFRNONAA 50 (L) 07/30/2014 1051   GFRAA 51 (L) 08/19/2020 1030   GFRAA 57 (L) 07/30/2014 1051    BNP No results found for: "BNP"  ProBNP    Component Value Date/Time   PROBNP  511.0 (H) 01/15/2009 1358    Imaging: MR Abdomen W Wo Contrast  Result Date: 09/20/2023 CLINICAL DATA:  Renal mass/cyst, indeterminate, status post right nephrectomy EXAM: MRI ABDOMEN WITHOUT AND WITH CONTRAST TECHNIQUE: Multiplanar multisequence MR imaging of the abdomen was performed both before and after the administration of intravenous contrast. CONTRAST:  10mL GADAVIST GADOBUTROL 1 MMOL/ML IV SOLN COMPARISON:  CT abdomen pelvis, 09/10/2023 FINDINGS: Lower chest: No acute abnormality.   Cardiomegaly. Hepatobiliary: No focal liver abnormality is seen. Status post cholecystectomy. Mild postoperative Pancreas: Unremarkable. No pancreatic ductal dilatation or surrounding inflammatory changes. Spleen: Normal in size without significant abnormality. Adrenals/Urinary Tract: Adrenal glands are unremarkable. Status post right nephrectomy. No suspicious soft tissue or contrast enhancement in the nephrectomy bed. The left kidney is normal, without obvious renal calculi, solid lesion, or hydronephrosis. Stomach/Bowel: Stomach is within normal limits. No evidence of bowel wall thickening, distention, or inflammatory changes. Vascular/Lymphatic: No significant vascular findings are present. No enlarged abdominal lymph nodes. Other: No abdominal wall hernia or abnormality. No ascites. Musculoskeletal: No acute or significant osseous findings. IMPRESSION: 1. Status post right nephrectomy. No evidence of recurrent or metastatic disease in the abdomen. 2. No suspicious abnormality of the left kidney. No mass or abnormal contrast enhancement to correspond to suspected upper pole renal lesion described by prior CT. 3. Cardiomegaly. Electronically Signed   By: Jearld Lesch M.D.   On: 09/20/2023 13:27   VAS [place] LOWER EXTREMITY VENOUS (DVT)  Result Date: 09/12/2023  Lower Venous DVT Study Patient Name:  TIMOTHEA GAMBA [facility]  Date of Exam:   09/12/2023 Medical Rec #: [id number]         Accession #:    [id number] Date of Birth: [birth year]        Patient Gender: F Patient Age:   30 years Exam Location:  [facility] Procedure:      VAS [place] LOWER EXTREMITY VENOUS (DVT) Referring Phys: [facility] GOEL --------------------------------------------------------------------------------  Indications: Swelling, and Edema.  Risk Factors: Obesity and past pregnancy. Anticoagulation: Coumadin. Comparison Study: No significant changes seen since prior exam 09/05/12 Performing Technologist: Shona Simpson  Examination Guidelines: A  complete evaluation includes B-mode imaging, spectral Doppler, color Doppler, and power Doppler as needed of all accessible portions of each vessel. Bilateral testing is considered an integral part of a complete examination. Limited examinations for reoccurring indications may be performed as noted. The reflux portion of the exam is performed with the patient in reverse Trendelenburg.  +---------+---------------+---------+-----------+----------+--------------+ RIGHT    CompressibilityPhasicitySpontaneityPropertiesThrombus Aging +---------+---------------+---------+-----------+----------+--------------+ CFV      Full           Yes      Yes                                 +---------+---------------+---------+-----------+----------+--------------+ SFJ      Full                                                        +---------+---------------+---------+-----------+----------+--------------+ FV Prox  Full                                                        +---------+---------------+---------+-----------+----------+--------------+  FV Mid   Full                                                        +---------+---------------+---------+-----------+----------+--------------+ FV DistalFull                                                        +---------+---------------+---------+-----------+----------+--------------+ PFV      Full                                                        +---------+---------------+---------+-----------+----------+--------------+ POP      Full           Yes      Yes                                 +---------+---------------+---------+-----------+----------+--------------+ PTV      Full                                                        +---------+---------------+---------+-----------+----------+--------------+ PERO     Full                                                         +---------+---------------+---------+-----------+----------+--------------+   +---------+---------------+---------+-----------+----------+--------------+ LEFT     CompressibilityPhasicitySpontaneityPropertiesThrombus Aging +---------+---------------+---------+-----------+----------+--------------+ CFV      Full           Yes      Yes                                 +---------+---------------+---------+-----------+----------+--------------+ SFJ      Full                                                        +---------+---------------+---------+-----------+----------+--------------+ FV Prox  Full                                                        +---------+---------------+---------+-----------+----------+--------------+ FV Mid   Full                                                        +---------+---------------+---------+-----------+----------+--------------+  FV DistalFull                                                        +---------+---------------+---------+-----------+----------+--------------+ PFV      Full                                                        +---------+---------------+---------+-----------+----------+--------------+ POP      Full           Yes      Yes                                 +---------+---------------+---------+-----------+----------+--------------+ PTV      Full                                                        +---------+---------------+---------+-----------+----------+--------------+ PERO     Full                                                        +---------+---------------+---------+-----------+----------+--------------+     Summary: BILATERAL: - No evidence of deep vein thrombosis seen in the lower extremities, bilaterally. -No evidence of popliteal cyst, bilaterally.   *See table(s) above for measurements and observations. Electronically signed by Coral Else MD on 09/12/2023 at 7:39:53  PM.    Final    ECHOCARDIOGRAM COMPLETE  Result Date: 09/12/2023    ECHOCARDIOGRAM REPORT   Patient Name:   SHENEKIA REITSMA [facility] Date of Exam: 09/12/2023 Medical Rec #:  [id number]        Height:       63.0 in Accession #:    [id number]       Weight:       245.8 lb Date of Birth:  November 12, 1945       BSA:          2.111 m Patient Age:    80 years         BP:           142/64 mmHg Patient Gender: F                HR:           58 bpm. Exam Location:  Inpatient Procedure: 2D Echo, 3D Echo, Cardiac Doppler, Color Doppler and Strain Analysis Indications:    Chest Pain  History:        Patient has prior history of Echocardiogram examinations, most                 recent 09/10/2020. Risk Factors:Sleep Apnea, Dyslipidemia and                 Hypertension.  Sonographer:    Karma Ganja Referring Phys: [id number] Jon Billings A REGALADO  Sonographer Comments: Global  longitudinal strain was attempted. IMPRESSIONS  1. Left ventricular ejection fraction, by estimation, is 70 to 75%. Left ventricular ejection fraction by 3D volume is 73 %. The left ventricle has hyperdynamic function. The left ventricle has no regional wall motion abnormalities. There is mild concentric left ventricular hypertrophy. Left ventricular diastolic parameters are consistent with Grade II diastolic dysfunction (pseudonormalization). Elevated left ventricular end-diastolic pressure. The average left ventricular global longitudinal strain is -17.4 %. The global longitudinal strain is normal.  2. Right ventricular systolic function is normal. The right ventricular size is moderately enlarged. There is mildly elevated pulmonary artery systolic pressure. The estimated right ventricular systolic pressure is 44.0 mmHg.  3. Left atrial size was severely dilated.  4. Right atrial size was mildly dilated.  5. The mitral valve is normal in structure. Trivial mitral valve regurgitation. No evidence of mitral stenosis.  6. The aortic valve is tricuspid. Aortic valve regurgitation  is not visualized. Aortic valve sclerosis/calcification is present, without any evidence of aortic stenosis. Aortic valve area, by VTI measures 2.35 cm. Aortic valve mean gradient measures 8.0 mmHg. Aortic valve Vmax measures 1.75 m/s.  7. The inferior vena cava is normal in size with greater than 50% respiratory variability, suggesting right atrial pressure of 3 mmHg. FINDINGS  Left Ventricle: Left ventricular ejection fraction, by estimation, is 70 to 75%. Left ventricular ejection fraction by 3D volume is 73 %. The left ventricle has hyperdynamic function. The left ventricle has no regional wall motion abnormalities. The average left ventricular global longitudinal strain is -17.4 %. The global longitudinal strain is normal. The left ventricular internal cavity size was normal in size. There is mild concentric left ventricular hypertrophy. Left ventricular diastolic parameters are consistent with Grade II diastolic dysfunction (pseudonormalization). Elevated left ventricular end-diastolic pressure. Right Ventricle: The right ventricular size is moderately enlarged. No increase in right ventricular wall thickness. Right ventricular systolic function is normal. There is mildly elevated pulmonary artery systolic pressure. The tricuspid regurgitant velocity is 3.20 m/s, and with an assumed right atrial pressure of 3 mmHg, the estimated right ventricular systolic pressure is 44.0 mmHg. Left Atrium: Left atrial size was severely dilated. Right Atrium: Right atrial size was mildly dilated. Pericardium: There is no evidence of pericardial effusion. Mitral Valve: The mitral valve is normal in structure. Trivial mitral valve regurgitation. No evidence of mitral valve stenosis. Tricuspid Valve: The tricuspid valve is normal in structure. Tricuspid valve regurgitation is mild . No evidence of tricuspid stenosis. Aortic Valve: The aortic valve is tricuspid. Aortic valve regurgitation is not visualized. Aortic valve  sclerosis/calcification is present, without any evidence of aortic stenosis. Aortic valve mean gradient measures 8.0 mmHg. Aortic valve peak gradient measures 12.2 mmHg. Aortic valve area, by VTI measures 2.35 cm. Pulmonic Valve: The pulmonic valve was normal in structure. Pulmonic valve regurgitation is not visualized. No evidence of pulmonic stenosis. Aorta: The aortic root is normal in size and structure. Venous: The inferior vena cava is normal in size with greater than 50% respiratory variability, suggesting right atrial pressure of 3 mmHg. IAS/Shunts: No atrial level shunt detected by color flow Doppler.  LEFT VENTRICLE PLAX 2D LVIDd:         4.60 cm         Diastology LVIDs:         2.80 cm         LV e' medial:    6.31 cm/s LV PW:         1.20 cm  LV E/e' medial:  18.4 LV IVS:        1.20 cm         LV e' lateral:   6.64 cm/s LVOT diam:     1.90 cm         LV E/e' lateral: 17.5 LV SV:         111 LV SV Index:   53              2D LVOT Area:     2.84 cm        Longitudinal                                Strain                                2D Strain GLS  -17.8 %                                (A2C):                                2D Strain GLS  -18.2 %                                (A3C):                                2D Strain GLS  -16.3 %                                (A4C):                                2D Strain GLS  -17.4 %                                Avg:                                 3D Volume EF                                LV 3D EF:    Left                                             ventricul                                             ar  ejection                                             fraction                                             by 3D                                             volume is                                             73 %.                                 3D Volume EF:                                3D EF:         73 %                                LV EDV:       151 ml                                LV ESV:       40 ml                                LV SV:        110 ml RIGHT VENTRICLE             IVC RV Basal diam:  4.80 cm     IVC diam: 1.50 cm RV S prime:     12.30 cm/s TAPSE (M-mode): 2.4 cm LEFT ATRIUM              Index        RIGHT ATRIUM           Index LA diam:        5.00 cm  2.37 cm/m   RA Area:     20.00 cm LA Vol (A2C):   92.9 ml  44.01 ml/m  RA Volume:   66.30 ml  31.41 ml/m LA Vol (A4C):   102.0 ml 48.32 ml/m LA Biplane Vol: 97.1 ml  46.00 ml/m  AORTIC VALVE AV Area (Vmax):    2.40 cm AV Area (Vmean):   2.26 cm AV Area (VTI):     2.35 cm AV Vmax:           175.00 cm/s AV Vmean:          132.000 cm/s AV VTI:            0.473 m AV Peak Grad:  12.2 mmHg AV Mean Grad:      8.0 mmHg LVOT Vmax:         148.00 cm/s LVOT Vmean:        105.000 cm/s LVOT VTI:          0.392 m LVOT/AV VTI ratio: 0.83  AORTA Ao Root diam: 2.80 cm Ao Asc diam:  2.20 cm MITRAL VALVE                TRICUSPID VALVE MV Area (PHT): 3.63 cm     TR Peak grad:   41.0 mmHg MV Decel Time: 209 msec     TR Vmax:        320.00 cm/s MV E velocity: 116.00 cm/s MV A velocity: 97.70 cm/s   SHUNTS MV E/A ratio:  1.19         Systemic VTI:  0.39 m                             Systemic Diam: 1.90 cm Armanda Magic MD Electronically signed by Armanda Magic MD Signature Date/Time: 09/12/2023/12:23:13 PM    Final    CT ABDOMEN PELVIS W CONTRAST  Result Date: 09/10/2023 CLINICAL DATA:  Increase in left lower quadrant pain EXAM: CT ABDOMEN AND PELVIS WITH CONTRAST TECHNIQUE: Multidetector CT imaging of the abdomen and pelvis was performed using the standard protocol following bolus administration of intravenous contrast. RADIATION DOSE REDUCTION: This exam was performed according to the departmental dose-optimization program which includes automated exposure control, adjustment of the mA and/or kV according to patient size and/or use of  iterative reconstruction technique. CONTRAST:  OMNIPAQUE IOHEXOL 300 MG/ML  SOLN COMPARISON:  09/06/2023 FINDINGS: Lower chest: No acute abnormality. Hepatobiliary: No focal liver abnormality is seen. Status post cholecystectomy. No biliary dilatation. Pancreas: Unremarkable. No pancreatic ductal dilatation or surrounding inflammatory changes. Spleen: Normal in size without focal abnormality. Adrenals/Urinary Tract: Adrenal glands are within normal limits. 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 The previously seen perfusion defect in the left kidney is no longer identified. Normal excretion is noted. No renal calculi are seen. Previously described left ureteral stone actually represents a left ovarian vein phlebolith and is stable in appearance. The ureter on the left is unremarkable. Bladder is well distended. Stomach/Bowel: Scattered diverticular change of the colon is noted without evidence of diverticulitis. The appendix is within normal limits. Small bowel and stomach are unremarkable. Vascular/Lymphatic: Aortic atherosclerosis. No enlarged abdominal or pelvic lymph nodes. Reproductive: Uterus and bilateral adnexa are unremarkable. Other: No abdominal wall hernia or abnormality. No abdominopelvic ascites. Musculoskeletal: No acute or significant osseous findings. IMPRESSION: No acute abnormality noted. Stable diverticulosis without diverticulitis. Previously described left ureteral stone actually represents a left ovarian vein phlebolith. No obstructive changes seen. Resolution of previously seen perfusion defect in the left kidney. Electronically Signed   By: Alcide Clever M.D.   On: 09/10/2023 23:33   DG Chest 2 View  Result Date: 09/10/2023 CLINICAL DATA:  Mid back pain EXAM: CHEST - 2 VIEW COMPARISON:  05/23/2023 FINDINGS: Heart and mediastinal contours are within normal limits. Increased markings in the lung bases, favor atelectasis. No effusions or pneumothorax. No acute bony abnormality.  IMPRESSION: Bibasilar atelectasis.  No active disease. Electronically Signed   By: Charlett Nose M.D.   On: 09/10/2023 19:37   CT Angio Chest/Abd/Pel for Dissection W and/or W/WO  Result Date: 09/06/2023 CLINICAL DATA:  77 year old female with history of back pain radiating  into the chest. Suspected acute aortic syndrome. Additional history of Crohn's disease and diverticulitis. EXAM: CT ANGIOGRAPHY CHEST, ABDOMEN AND PELVIS TECHNIQUE: Non-contrast CT of the chest was initially obtained. Multidetector CT imaging through the chest, abdomen and pelvis was performed using the standard protocol during bolus administration of intravenous contrast. Multiplanar reconstructed images and MIPs were obtained and reviewed to evaluate the vascular anatomy. RADIATION DOSE REDUCTION: This exam was performed according to the departmental dose-optimization program which includes automated exposure control, adjustment of the mA and/or kV according to patient size and/or use of iterative reconstruction technique. CONTRAST:  OMNIPAQUE IOHEXOL 350 MG/ML SOLN COMPARISON:  CT of the abdomen and pelvis 04/01/2022. No prior chest CT. FINDINGS: CTA CHEST FINDINGS Cardiovascular: Precontrast images demonstrate no crescentic high attenuation associated with the wall of the thoracic aorta to suggest the presence of acute intramural hemorrhage. Postcontrast images demonstrate no evidence of thoracic aortic aneurysm or dissection. Thoracic aorta is normal in caliber measuring 3.1 cm, 2.6 cm and 2.4 cm in diameter in the ascending thoracic aorta, mid aortic arch and descending thoracic aorta respectively. There is aortic atherosclerosis, as well as atherosclerosis of the great vessels of the mediastinum and the coronary arteries, including calcified atherosclerotic plaque in the left anterior descending coronary artery. Mediastinum/Nodes: No pathologically enlarged mediastinal or hilar lymph nodes. Small hiatal hernia. No axillary  lymphadenopathy. Lungs/Pleura: Small calcified granulomas are noted in the lungs. No other suspicious appearing pulmonary nodules or masses are noted. No acute consolidative airspace disease. No pleural effusions. Musculoskeletal: There are no aggressive appearing lytic or blastic lesions noted in the visualized portions of the skeleton. Review of the MIP images confirms the above findings. CTA ABDOMEN AND PELVIS FINDINGS VASCULAR Aorta: Normal caliber aorta without aneurysm, dissection, vasculitis or significant stenosis. Celiac: Patent without evidence of aneurysm, dissection, vasculitis or significant stenosis. SMA: Patent without evidence of aneurysm, dissection, vasculitis or significant stenosis. Renals: The left renal artery is patent without evidence of aneurysm, dissection, vasculitis, fibromuscular dysplasia or significant stenosis. Right renal artery is ligated (patient is status post right nephrectomy). IMA: Patent without evidence of aneurysm, dissection, vasculitis or significant stenosis. Inflow: Patent without evidence of aneurysm, dissection, vasculitis or significant stenosis. Veins: No obvious venous abnormality within the limitations of this arterial phase study. Review of the MIP images confirms the above findings. NON-VASCULAR Hepatobiliary: Liver has a shrunken appearance and nodular contour, indicative of underlying cirrhosis. No discrete cystic or solid hepatic lesions. No intra or extrahepatic biliary ductal dilatation. Status post cholecystectomy. Pancreas: No pancreatic mass. No pancreatic ductal dilatation. No pancreatic or peripancreatic fluid collections or inflammatory changes. Spleen: Unremarkable. Adrenals/Urinary Tract: Status post right radical nephrectomy. Poorly defined 2.5 x 2.3 x 2.6 cm hypovascular area in the upper pole of the left kidney (axial image 104 of series 302 and coronal image 86 of series 601) is considered indeterminate. On some images, this appears somewhat  wedge-shaped, indicating possible infarct. Bilateral adrenal glands are normal in appearance. 4 mm calculus in the middle third of the left ureter (axial image 151 of series 302 and coronal image 107 of series 601), without associated proximal hydroureteronephrosis to indicate obstruction at this time. Urinary bladder is unremarkable in appearance. Stomach/Bowel: The appearance of the stomach is normal. There is no pathologic dilatation of small bowel or colon. Numerous colonic diverticuli are noted, without surrounding inflammatory changes to indicate an acute diverticulitis at this time. Normal appendix. Lymphatic: No lymphadenopathy noted in the abdomen or pelvis. Reproductive: Uterus and ovaries are  unremarkable in appearance. Other: No significant volume of ascites.  No pneumoperitoneum. Musculoskeletal: There are no aggressive appearing lytic or blastic lesions noted in the visualized portions of the skeleton. Review of the MIP images confirms the above findings. IMPRESSION: 1. No evidence of acute aortic syndrome. 2. 4 mm nonobstructive calculus in the middle third of the left ureter without proximal hydroureteronephrosis at this time. 3. Indeterminate hypovascular area in the upper pole of the left kidney. On some images this appears somewhat wedge-shaped, which could indicate an infarct, however, the possibility of an infiltrative neoplasm is not excluded. Follow-up nonemergent outpatient abdominal MRI with and without IV gadolinium is recommended in the near future to better evaluate this finding and exclude neoplasm. 4. Aortic atherosclerosis, in addition to left anterior descending coronary artery disease. Assessment for potential risk factor modification, dietary therapy or pharmacologic therapy may be warranted, if clinically indicated. 5. Colonic diverticulosis without evidence of acute diverticulitis at this time. 6. Morphologic changes in the liver suggestive of underlying cirrhosis. No suspicious  hepatic lesions are noted. 7. Additional incidental findings, as above. Electronically Signed   By: Trudie Reed M.D.   On: 09/06/2023 05:29   DG Thoracic Spine 2 View  Result Date: 09/06/2023 CLINICAL DATA:  Midthoracic back pain since last night EXAM: THORACIC SPINE 2 VIEWS COMPARISON:  None Available. FINDINGS: No evidence of acute fracture or traumatic listhesis. Demineralization. Multilevel age-related spondylosis IMPRESSION: No acute fracture or traumatic listhesis. Electronically Signed   By: Minerva Fester M.D.   On: 09/06/2023 00:18    Administration History     None           No data to display          No results found for: "NITRICOXIDE"      Assessment & Plan:   OSA (obstructive sleep apnea) Excellent control and compliance on CPAP.  Continue on current settings. Education given on sleep apnea and CPAP care  - discussed how weight can impact sleep and risk for sleep disordered breathing - discussed options to assist with weight loss: combination of diet modification, cardiovascular and strength training exercises   - had an extensive discussion regarding the adverse health consequences related to untreated sleep disordered breathing - specifically discussed the risks for hypertension, coronary artery disease, cardiac dysrhythmias, cerebrovascular disease, and diabetes - lifestyle modification discussed   - discussed how sleep disruption can increase risk of accidents, particularly when driving - safe driving practices were discussed   Plan  Patient Instructions  Continue on CPAP At bedtime   Keep up good work  Do not drive if sleepy.  Work on healthy weight  Follow up with Dr. Vassie Loll or Lautaro Koral NP   In 1 year and As needed   Please contact office for sooner follow up if symptoms do not improve or worsen or seek emergency care     Rheumatoid arthritis [facility]) Keep follow-up with rheumatology this month as planned.  Continue on current maintenance  regimen.  Morbid obesity (HCC) Work on healthy weight loss     Rubye Oaks, NP 09/25/2023

## 2023-09-25 NOTE — Assessment & Plan Note (Signed)
Keep follow-up with rheumatology this month as planned.  Continue on current maintenance regimen.

## 2023-09-25 NOTE — Assessment & Plan Note (Signed)
Excellent control and compliance on CPAP.  Continue on current settings. Education given on sleep apnea and CPAP care  - discussed how weight can impact sleep and risk for sleep disordered breathing - discussed options to assist with weight loss: combination of diet modification, cardiovascular and strength training exercises   - had an extensive discussion regarding the adverse health consequences related to untreated sleep disordered breathing - specifically discussed the risks for hypertension, coronary artery disease, cardiac dysrhythmias, cerebrovascular disease, and diabetes - lifestyle modification discussed   - discussed how sleep disruption can increase risk of accidents, particularly when driving - safe driving practices were discussed   Plan  Patient Instructions  Continue on CPAP At bedtime   Keep up good work  Do not drive if sleepy.  Work on healthy weight  Follow up with Dr. Vassie Loll or Jp Eastham NP   In 1 year and As needed   Please contact office for sooner follow up if symptoms do not improve or worsen or seek emergency care

## 2023-09-26 ENCOUNTER — Telehealth: Payer: Self-pay | Admitting: Family Medicine

## 2023-09-26 ENCOUNTER — Ambulatory Visit: Payer: Medicare Other | Admitting: Cardiology

## 2023-09-26 DIAGNOSIS — F419 Anxiety disorder, unspecified: Secondary | ICD-10-CM | POA: Diagnosis not present

## 2023-09-26 DIAGNOSIS — I10 Essential (primary) hypertension: Secondary | ICD-10-CM | POA: Diagnosis not present

## 2023-09-26 DIAGNOSIS — E7800 Pure hypercholesterolemia, unspecified: Secondary | ICD-10-CM | POA: Diagnosis not present

## 2023-09-26 DIAGNOSIS — G4733 Obstructive sleep apnea (adult) (pediatric): Secondary | ICD-10-CM

## 2023-09-26 DIAGNOSIS — I4819 Other persistent atrial fibrillation: Secondary | ICD-10-CM

## 2023-09-26 DIAGNOSIS — D6869 Other thrombophilia: Secondary | ICD-10-CM

## 2023-09-26 DIAGNOSIS — E559 Vitamin D deficiency, unspecified: Secondary | ICD-10-CM | POA: Diagnosis not present

## 2023-09-26 DIAGNOSIS — M0589 Other rheumatoid arthritis with rheumatoid factor of multiple sites: Secondary | ICD-10-CM | POA: Diagnosis not present

## 2023-09-26 DIAGNOSIS — I2510 Atherosclerotic heart disease of native coronary artery without angina pectoris: Secondary | ICD-10-CM

## 2023-09-26 DIAGNOSIS — I480 Paroxysmal atrial fibrillation: Secondary | ICD-10-CM | POA: Diagnosis not present

## 2023-09-26 NOTE — Telephone Encounter (Signed)
Caller/Agency: [facility])  Callback Number: [phone] (secure) Requesting OT/PT/Skilled Nursing/Social Work/Speech Therapy: OT Frequency: 1x for 8 weeks

## 2023-09-26 NOTE — Telephone Encounter (Signed)
Verbal order left on vm 

## 2023-09-29 ENCOUNTER — Other Ambulatory Visit: Payer: Self-pay | Admitting: Family Medicine

## 2023-09-29 DIAGNOSIS — E559 Vitamin D deficiency, unspecified: Secondary | ICD-10-CM | POA: Diagnosis not present

## 2023-09-29 DIAGNOSIS — M0589 Other rheumatoid arthritis with rheumatoid factor of multiple sites: Secondary | ICD-10-CM | POA: Diagnosis not present

## 2023-09-29 DIAGNOSIS — E7800 Pure hypercholesterolemia, unspecified: Secondary | ICD-10-CM | POA: Diagnosis not present

## 2023-09-29 DIAGNOSIS — I10 Essential (primary) hypertension: Secondary | ICD-10-CM | POA: Diagnosis not present

## 2023-09-29 DIAGNOSIS — F419 Anxiety disorder, unspecified: Secondary | ICD-10-CM | POA: Diagnosis not present

## 2023-09-29 DIAGNOSIS — I480 Paroxysmal atrial fibrillation: Secondary | ICD-10-CM | POA: Diagnosis not present

## 2023-10-02 DIAGNOSIS — I10 Essential (primary) hypertension: Secondary | ICD-10-CM | POA: Diagnosis not present

## 2023-10-02 DIAGNOSIS — M0589 Other rheumatoid arthritis with rheumatoid factor of multiple sites: Secondary | ICD-10-CM | POA: Diagnosis not present

## 2023-10-02 DIAGNOSIS — E7800 Pure hypercholesterolemia, unspecified: Secondary | ICD-10-CM | POA: Diagnosis not present

## 2023-10-02 DIAGNOSIS — F419 Anxiety disorder, unspecified: Secondary | ICD-10-CM | POA: Diagnosis not present

## 2023-10-02 DIAGNOSIS — I480 Paroxysmal atrial fibrillation: Secondary | ICD-10-CM | POA: Diagnosis not present

## 2023-10-02 DIAGNOSIS — E559 Vitamin D deficiency, unspecified: Secondary | ICD-10-CM | POA: Diagnosis not present

## 2023-10-03 ENCOUNTER — Ambulatory Visit: Payer: Medicare Other | Attending: Cardiology

## 2023-10-03 DIAGNOSIS — I4891 Unspecified atrial fibrillation: Secondary | ICD-10-CM | POA: Diagnosis not present

## 2023-10-03 DIAGNOSIS — Z5181 Encounter for therapeutic drug level monitoring: Secondary | ICD-10-CM | POA: Diagnosis not present

## 2023-10-03 LAB — POCT INR: INR: 1.8 — AB (ref 2.0–3.0)

## 2023-10-03 NOTE — Patient Instructions (Signed)
Description   Take 1 tablet today and then continue taking 2.5mg  daily EXCEPT 1.25mg  on Sundays, Tuesdays, Thursdays and Saturday.   Stay consistent with green each week  (2 per week)  Repeat INR in 2 weeks.  [facility] [phone]

## 2023-10-04 NOTE — Progress Notes (Signed)
Cardiology Office Note    Date:  10/06/2023  ID:  [name], DOB 1946/03/06, MRN [id number] PCP:  Bradd Canary, MD  Cardiologist:  Olga Millers, MD  Electrophysiologist:  Will Jorja Loa, MD   Chief Complaint: Hospital follow up   History of Present Illness: .    [name] is a 77 y.o. 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   Cardaic catheterization in 02/2007 indicated nonobstructive plaque in the LAD.  EF at the time was 55%.  The left circumflex and right coronary artery were normal.  Per Dr. Jens Som she had a previous MI that was felt to be secondary to a first diagonal branch occlusion.  In September 2012 she had a workup for pheochromocytoma that was negative. 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  January 2018 she wore a cardiac event monitor that showed sinus with PACs, PVCs and ectopic atrial tachycardia.  She has known history of atrial fibrillation, maintained in normal sinus rhythm on Tikosyn with occasional breakthrough.  In 10/2016 she was found to be in atrial fibrillation, prior to DCCV she self converted back to normal sinus rhythm, this occurred has occurred intermittently in the past few years.    On 01/26/2023 she presented to the emergency room for chest discomfort and palpitations.  She was in atrial fibrillation.  She underwent DCCV with successful conversion to normal sinus rhythm with a single shock.  It was noted that she had started a Medrol Dosepak  the day prior to her presenting in the emergency room.  She was discharged in stable condition with recommended follow-up with cardiology. On 05/23/23 she presented to [facility] ED with palpitations. She was found to be in atrial fibrillation. She woke up that morning feeling as though her heart was racing, she was otherwise asymptomatic. She reported compliance with her Tikosyn and metoprolol, although she did not think she had taken her Coumadin the the prior five/six days. Cardiology was consulted, recommended rate control and follow up outpatient.   She was seen in general cardiology clinic on 05/31/2023, it was noted that she had been consistently in atrial fibrillation for the past week as she been monitoring on her Kardia mobile.  Her EKG at visit indicated sinus bradycardia at 55 bpm.  She was seen by Dr. Elberta Fortis on 07/03/2023, unfortunately given her BMI she was not a candidate for ablation.  Medications continued.  Earlier in the fall she had COVID, noted that she felt it knocked her back a bit.  She recently had an outpatient CT head done for headache which showed sinus disease and chronic microvascular ischemia.  She was seen in the ED on 09/06/2023 with back pain.  CTA showed no dissection, positive ureteral stone, indeterminate hypovascular area of left kidney pole of undetermined significance, aortic/coronary atherosclerosis, diverticulosis and possible morphologic changes of cirrhosis.  Workup was overall reassuring and she was discharged home.  She returned to the hospital in 09/10/2023 with continued back pain with component of chest discomfort.  It was noted that her back pain/chest pain was preceded by  some left shoulder pain that occurred with specific movements like lifting her arm, moving it forward, bending her shoulder.  In addition to migrating back pain she also noted associated migrating chest pain that was not clearly associated with exertion, would occur at random but did seem  provoked by certain movements.  She took Tylenol and a muscle relaxer at home that seem to improve her pain.  Labs showed low level troponin elevation of 25 --> 21, D-dimer was negative, INR 2.5.  Echocardiogram on 09/12/2023 indicated LVEF 70 to 75%, no RWMA, mild concentric LVH, grade 2 diastolic dysfunction, RV systolic function normal, RV moderately enlarged, mildly elevated pulmonary artery systolic pressure, LA severely dilated, RA mildly dilated  Today she presents for follow-up.  She reports that she is doing okay.  She has had ongoing back and left arm pain related to her rheumatoid arthritis, this has overall been very limiting for her.  She has been working with her physical therapist, primary care and rheumatologist.  She is going to have a Remicade infusion on Monday and is hopeful that this will help improve her symptoms.  Her back and arm pain worsens with movement and palpitation, reassuring that this is not cardiac in nature.  She feels that her breathing is at baseline, she denies any lower extremity edema.  She denies orthopnea or PND.  She has followed up with her pulmonologist regarding her CPAP, noted improvement.  She had notes that she had COVID before her hospitalizations and she has had ongoing problems since then.  She notes that she has had some breakthrough atrial fibrillation, reports she is generally cardiac unaware.  She also reports that she was unable to tolerate valsartan 320 mg daily, as this caused low blood pressures, dizziness and lightheadedness.  She has continued valsartan 160 mg daily.  ROS: .   Today she denies lower extremity edema, palpitations, melena, hematuria, hemoptysis, diaphoresis, weakness, presyncope, syncope, orthopnea, and PND. .  All other systems are reviewed and otherwise negative. Studies Reviewed: Marland Kitchen    EKG:  EKG is ordered today, personally reviewed, demonstrating  EKG Interpretation Date/Time:  Friday October 06 2023 13:31:43 EST Ventricular  Rate:  60 PR Interval:  176 QRS Duration:  86 QT Interval:  460 QTC Calculation: 460 R Axis:   39  Text Interpretation: Normal sinus rhythm Normal ECG When compared with ECG of 11-Sep-2023 16:10, No significant change was found Confirmed by Reather Littler [id number]) on 10/06/2023 1:35:13 PM   CV Studies:  Cardiac Studies & Procedures      ECHOCARDIOGRAM  ECHOCARDIOGRAM COMPLETE 09/12/2023  Narrative ECHOCARDIOGRAM REPORT    Patient Name:   [name] [facility] Date of Exam: 09/12/2023 Medical Rec #:  [id number]        Height:       63.0 in Accession #:    [id number]       Weight:       245.8 lb Date of Birth:  25-Sep-1946       BSA:          2.111 m Patient Age:    77 years         BP:           142/64 mmHg Patient Gender: F                HR:           58 bpm. Exam Location:  Inpatient  Procedure: 2D Echo, 3D Echo, Cardiac Doppler, Color Doppler and  Strain Analysis  Indications:    Chest Pain  History:        Patient has prior history of Echocardiogram examinations, most recent 09/10/2020. Risk Factors:Sleep Apnea, Dyslipidemia and Hypertension.  Sonographer:    Karma Ganja Referring Phys: [id number] Jon Billings A REGALADO   Sonographer Comments: Global longitudinal strain was attempted. IMPRESSIONS   1. Left ventricular ejection fraction, by estimation, is 70 to 75%. Left ventricular ejection fraction by 3D volume is 73 %. The left ventricle has hyperdynamic function. The left ventricle has no regional wall motion abnormalities. There is mild concentric left ventricular hypertrophy. Left ventricular diastolic parameters are consistent with Grade II diastolic dysfunction (pseudonormalization). Elevated left ventricular end-diastolic pressure. The average left ventricular global longitudinal strain is -17.4 %. The global longitudinal strain is normal. 2. Right ventricular systolic function is normal. The right ventricular size is moderately enlarged. There is mildly elevated pulmonary artery  systolic pressure. The estimated right ventricular systolic pressure is 44.0 mmHg. 3. Left atrial size was severely dilated. 4. Right atrial size was mildly dilated. 5. The mitral valve is normal in structure. Trivial mitral valve regurgitation. No evidence of mitral stenosis. 6. The aortic valve is tricuspid. Aortic valve regurgitation is not visualized. Aortic valve sclerosis/calcification is present, without any evidence of aortic stenosis. Aortic valve area, by VTI measures 2.35 cm. Aortic valve mean gradient measures 8.0 mmHg. Aortic valve Vmax measures 1.75 m/s. 7. The inferior vena cava is normal in size with greater than 50% respiratory variability, suggesting right atrial pressure of 3 mmHg.  FINDINGS Left Ventricle: Left ventricular ejection fraction, by estimation, is 70 to 75%. Left ventricular ejection fraction by 3D volume is 73 %. The left ventricle has hyperdynamic function. The left ventricle has no regional wall motion abnormalities. The average left ventricular global longitudinal strain is -17.4 %. The global longitudinal strain is normal. The left ventricular internal cavity size was normal in size. There is mild concentric left ventricular hypertrophy. Left ventricular diastolic parameters are consistent with Grade II diastolic dysfunction (pseudonormalization). Elevated left ventricular end-diastolic pressure.  Right Ventricle: The right ventricular size is moderately enlarged. No increase in right ventricular wall thickness. Right ventricular systolic function is normal. There is mildly elevated pulmonary artery systolic pressure. The tricuspid regurgitant velocity is 3.20 m/s, and with an assumed right atrial pressure of 3 mmHg, the estimated right ventricular systolic pressure is 44.0 mmHg.  Left Atrium: Left atrial size was severely dilated.  Right Atrium: Right atrial size was mildly dilated.  Pericardium: There is no evidence of pericardial effusion.  Mitral Valve:  The mitral valve is normal in structure. Trivial mitral valve regurgitation. No evidence of mitral valve stenosis.  Tricuspid Valve: The tricuspid valve is normal in structure. Tricuspid valve regurgitation is mild . No evidence of tricuspid stenosis.  Aortic Valve: The aortic valve is tricuspid. Aortic valve regurgitation is not visualized. Aortic valve sclerosis/calcification is present, without any evidence of aortic stenosis. Aortic valve mean gradient measures 8.0 mmHg. Aortic valve peak gradient measures 12.2 mmHg. Aortic valve area, by VTI measures 2.35 cm.  Pulmonic Valve: The pulmonic valve was normal in structure. Pulmonic valve regurgitation is not visualized. No evidence of pulmonic stenosis.  Aorta: The aortic root is normal in size and structure.  Venous: The inferior vena cava is normal in size with greater than 50% respiratory variability, suggesting right atrial pressure of 3 mmHg.  IAS/Shunts: No atrial level shunt detected by color flow Doppler.   LEFT VENTRICLE PLAX 2D LVIDd:  4.60 cm         Diastology LVIDs:         2.80 cm         LV e' medial:    6.31 cm/s LV PW:         1.20 cm         LV E/e' medial:  18.4 LV IVS:        1.20 cm         LV e' lateral:   6.64 cm/s LVOT diam:     1.90 cm         LV E/e' lateral: 17.5 LV SV:         111 LV SV Index:   53              2D LVOT Area:     2.84 cm        Longitudinal Strain 2D Strain GLS  -17.8 % (A2C): 2D Strain GLS  -18.2 % (A3C): 2D Strain GLS  -16.3 % (A4C): 2D Strain GLS  -17.4 % Avg:  3D Volume EF LV 3D EF:    Left ventricul ar ejection fraction by 3D volume is 73 %.  3D Volume EF: 3D EF:        73 % LV EDV:       151 ml LV ESV:       40 ml LV SV:        110 ml  RIGHT VENTRICLE             IVC RV Basal diam:  4.80 cm     IVC diam: 1.50 cm RV S prime:     12.30 cm/s TAPSE (M-mode): 2.4 cm  LEFT ATRIUM              Index        RIGHT ATRIUM           Index LA diam:        5.00  cm  2.37 cm/m   RA Area:     20.00 cm LA Vol (A2C):   92.9 ml  44.01 ml/m  RA Volume:   66.30 ml  31.41 ml/m LA Vol (A4C):   102.0 ml 48.32 ml/m LA Biplane Vol: 97.1 ml  46.00 ml/m AORTIC VALVE AV Area (Vmax):    2.40 cm AV Area (Vmean):   2.26 cm AV Area (VTI):     2.35 cm AV Vmax:           175.00 cm/s AV Vmean:          132.000 cm/s AV VTI:            0.473 m AV Peak Grad:      12.2 mmHg AV Mean Grad:      8.0 mmHg LVOT Vmax:         148.00 cm/s LVOT Vmean:        105.000 cm/s LVOT VTI:          0.392 m LVOT/AV VTI ratio: 0.83  AORTA Ao Root diam: 2.80 cm Ao Asc diam:  2.20 cm  MITRAL VALVE                TRICUSPID VALVE MV Area (PHT): 3.63 cm     TR Peak grad:   41.0 mmHg MV Decel Time: 209 msec     TR Vmax:        320.00 cm/s MV E velocity: 116.00 cm/s MV A velocity: 97.70  cm/s   SHUNTS MV E/A ratio:  1.19         Systemic VTI:  0.39 m Systemic Diam: 1.90 cm  Armanda Magic MD Electronically signed by Armanda Magic MD Signature Date/Time: 09/12/2023/12:23:13 PM    Final   MONITORS  CARDIAC EVENT MONITOR 11/08/2016  Narrative Sinus with pacs, pvcs and ectopic atrial tachycardia Olga Millers             Current Reported Medications:.    Current Meds  Medication Sig   amLODipine (NORVASC) 10 MG tablet TAKE 1 TABLET BY MOUTH EVERY DAY   ciclopirox (PENLAC) 8 % solution Apply topically at bedtime. Apply over nail and surrounding skin. Apply daily over previous coat. After seven (7) days, may remove with alcohol and continue cycle.   dextrose 5 % SOLN 50 mL with methotrexate 25 MG/ML (PF) SOLN 40 mg/m2 Inject 40 mg/m2 into the vein once a week. Every wednesday   dofetilide (TIKOSYN) 125 MCG capsule TAKE 3 CAPSULES BY MOUTH TWICE A DAY   doxazosin (CARDURA) 2 MG tablet TAKE 1 TABLET BY MOUTH EVERY DAY   escitalopram (LEXAPRO) 10 MG tablet TAKE 1 TABLET BY MOUTH EVERY DAY   esomeprazole (NEXIUM) 40 MG capsule Take 1 capsule (40 mg total) by mouth daily.  Take in the morning before breakfast.   folic acid (FOLVITE) 1 MG tablet Take 2 mg by mouth daily.   inFLIXimab 10 mg/kg in sodium chloride 0.9 % Inject 10 mg/kg into the vein every 6 (six) weeks.   loperamide (IMODIUM A-D) 2 MG tablet Take 1 mg by mouth as needed. Dihrrhea   metoprolol tartrate (LOPRESSOR) 25 MG tablet TAKE 1 TABLET (25 MG) BY MOUTH IN THE MORNING AND AT BEDTIME   Multiple Vitamin (MULTIVITAMIN) capsule Take 1 capsule by mouth daily. 1 daily   nitroGLYCERIN (NITROSTAT) 0.4 MG SL tablet Place 1 tablet (0.4 mg total) under the tongue every 5 (five) minutes as needed for chest pain. Do not exceed 3 tabs in 15 minutes   Probiotic Product (PHILLIPS COLON HEALTH) CAPS Take 1 capsule by mouth daily. 1 per day   TUBERCULIN SYR 1CC/27GX1/2" 27G X 1/2" 1 ML MISC    valsartan (DIOVAN) 160 MG tablet Take 1 tablet (160 mg total) by mouth daily.   warfarin (COUMADIN) 2.5 MG tablet TAKE 1/2 TABLET TO 1 TABLET BY MOUTH DAILY AS DIRECTED BY COUMADIN CLINIC (Patient taking differently: Take 2.5 mg by mouth See admin instructions. TAKE 1/2 TABLET TO 1 TABLET BY MOUTH DAILY AS DIRECTED BY COUMADIN CLINIC)   [DISCONTINUED] valsartan (DIOVAN) 320 MG tablet Take 1 tablet (320 mg total) by mouth daily.    Physical Exam:    VS:  BP 126/64   Pulse 60   Ht 5\' 3"  (1.6 m)   Wt 246 lb (111.6 kg)   SpO2 95%   BMI 43.58 kg/m    Wt Readings from Last 3 Encounters:  10/06/23 246 lb (111.6 kg)  09/25/23 245 lb 12.8 oz (111.5 kg)  09/10/23 245 lb 13 oz (111.5 kg)    GEN: Well nourished, well developed in no acute distress NECK: No JVD; No carotid bruits CARDIAC: RRR, no murmurs, rubs, gallops RESPIRATORY:  Clear to auscultation without rales, wheezing or rhonchi  ABDOMEN: Soft, non-tender, non-distended EXTREMITIES:  No edema; No acute deformity   Asessement and Plan:.    Back pain/Chest pain/CAD: Cardaic catheterization in 02/2007 indicated nonobstructive plaque in the LAD.  EF at the time was  55%.  The left circumflex and right coronary artery were normal.  Per Dr. Jens Som she had a previous MI that was felt secondary to a first diagonal branch occlusion. Presented to the emergency room on 09/10/2023 with back pain with component of chest discomfort.  Felt to not be cardiac in nature given it was migratory and focal.  High sensitive troponin was low/flat.  2D echo showed EF 70 to 75%, mild LVH, G2 DD, elevated LVEDP, moderately enlarged RV with mild PHTN. Today she reports ongoing back and left shoulder pain that is aggravated further by movement and palpation, she has been working with a physical therapist, felt to be musculoskeletal pain on top of her rheumatoid arthritis.  She denies any anginal symptoms. Reviewed ED precautions. Continue metoprolol 25 mg twice daily, amlodipine 10 mg daily, valsartan 160 mg daily, not a candidate for thiazide diuretics with Tikosyn.  Persistent atrial fibrillation: On chronic Tikosyn, warfarin. Today she reports some occasional breakthrough atrial fibrillation although she notes she is overall cardiac unaware, notes she just feels slightly tired.  She saw Dr. Elberta Fortis in 06/2023, given her BMI she is not a candidate for ablation.  She was continued on Tikosyn. EKG today normal sinus rhythm at 60 bpm.  QTc 460 MS. Discussed referral to A-fib clinic, patient declined.  She denies any bleeding problems on Coumadin.  Reviewed ED precautions.  Continue Tikosyn and Lopressor.  On Coumadin followed by Coumadin clinic. CHA2DS2-VASc Score = 5 [CHF History: 0, HTN History: 1, Diabetes History: 0, Stroke History: 0, Vascular Disease History: 1, Age Score: 2, Gender Score: 1].  Therefore, the patient's annual risk of stroke is 7.2 %.       Hypertension: Blood pressure today 126/64.  Patient reports that she was unable to tolerate higher doses of valsartan for low blood pressure and dizziness.  She self reduced to valsartan 160 mg daily.  Continue Cardura 2 mg daily,  amlodipine 10 mg daily and valsartan 160 mg daily,  not a candidate for thiazide diuretics with Tikosyn. Check BMET.   Moderate pHTN/OSA: Echo during admission indicated moderately enlarged RV with mild PHTN.  Suspect this is related to morbid obesity and OSA.  She follows with pulmonology regarding nocturnal hypoxia despite CPAP.  Today she feels that her breathing is at baseline.  There is no lower extremity edema on exam.  She overall appears euvolemic.  Reports compliance with her CPAP, has been following up with her pulmonologist.  Hyperlipidemia: Last lipid profile on 09/12/2023 indicated total cholesterol 135, triglycerides 47, HDL 59 and LDL 67.   Carotid artery disease: Last carotid duplex on 08/2020 indicated 1 to 39% ICA stenosis bilaterally.  She has been asymptomatic, repeat if clinically indicated.    Disposition: F/u with Wallis Bamberg, NP in 2 months.   Signed, Rip Harbour, NP

## 2023-10-05 DIAGNOSIS — E7800 Pure hypercholesterolemia, unspecified: Secondary | ICD-10-CM | POA: Diagnosis not present

## 2023-10-05 DIAGNOSIS — I10 Essential (primary) hypertension: Secondary | ICD-10-CM | POA: Diagnosis not present

## 2023-10-05 DIAGNOSIS — E559 Vitamin D deficiency, unspecified: Secondary | ICD-10-CM | POA: Diagnosis not present

## 2023-10-05 DIAGNOSIS — I480 Paroxysmal atrial fibrillation: Secondary | ICD-10-CM | POA: Diagnosis not present

## 2023-10-05 DIAGNOSIS — F419 Anxiety disorder, unspecified: Secondary | ICD-10-CM | POA: Diagnosis not present

## 2023-10-05 DIAGNOSIS — M0589 Other rheumatoid arthritis with rheumatoid factor of multiple sites: Secondary | ICD-10-CM | POA: Diagnosis not present

## 2023-10-06 ENCOUNTER — Encounter: Payer: Self-pay | Admitting: Cardiology

## 2023-10-06 ENCOUNTER — Ambulatory Visit: Payer: Medicare Other | Attending: Cardiology | Admitting: Cardiology

## 2023-10-06 ENCOUNTER — Telehealth: Payer: Self-pay | Admitting: Family Medicine

## 2023-10-06 VITALS — BP 126/64 | HR 60 | Ht 63.0 in | Wt 246.0 lb

## 2023-10-06 DIAGNOSIS — I2510 Atherosclerotic heart disease of native coronary artery without angina pectoris: Secondary | ICD-10-CM | POA: Diagnosis not present

## 2023-10-06 DIAGNOSIS — I4819 Other persistent atrial fibrillation: Secondary | ICD-10-CM | POA: Diagnosis not present

## 2023-10-06 DIAGNOSIS — G4733 Obstructive sleep apnea (adult) (pediatric): Secondary | ICD-10-CM | POA: Diagnosis not present

## 2023-10-06 DIAGNOSIS — I10 Essential (primary) hypertension: Secondary | ICD-10-CM | POA: Diagnosis not present

## 2023-10-06 DIAGNOSIS — D6859 Other primary thrombophilia: Secondary | ICD-10-CM | POA: Diagnosis not present

## 2023-10-06 DIAGNOSIS — I6523 Occlusion and stenosis of bilateral carotid arteries: Secondary | ICD-10-CM

## 2023-10-06 MED ORDER — VALSARTAN 160 MG PO TABS: 160.0000 mg | ORAL_TABLET | Freq: Every day | ORAL | 3 refills | Status: DC

## 2023-10-06 NOTE — Patient Instructions (Signed)
Medication Instructions:  Decrease Valsartan to 160 mg once a day *If you need a refill on your cardiac medications before your next appointment, please call your pharmacy*  Lab Work: Today we will draw BMP and Mag If you have labs (blood work) drawn today and your tests are completely normal, you will receive your results only by: MyChart Message (if you have MyChart) OR A paper copy in the mail If you have any lab test that is abnormal or we need to change your treatment, we will call you to review the results.  Testing/Procedures: No testing  Follow-Up: At [facility], you and your health needs are our priority.  As part of our continuing mission to provide you with exceptional heart care, we have created designated Provider Care Teams.  These Care Teams include your primary Cardiologist (physician) and Advanced Practice Providers (APPs -  Physician Assistants and Nurse Practitioners) who all work together to provide you with the care you need, when you need it.  We recommend signing up for the patient portal called "MyChart".  Sign up information is provided on this After Visit Summary.  MyChart is used to connect with patients for Virtual Visits (Telemedicine).  Patients are able to view lab/test results, encounter notes, upcoming appointments, etc.  Non-urgent messages can be sent to your provider as well.   To learn more about what you can do with MyChart, go to [email or web address].    Your next appointment:   2 month(s)  Provider:   Wallis Bamberg, NP Rosalita Levan)

## 2023-10-06 NOTE — Telephone Encounter (Signed)
[name], OT with [facility], called to advise that an appt was missed because they were not able to coordinate times. They will resume next week.

## 2023-10-07 LAB — BASIC METABOLIC PANEL
BUN/Creatinine Ratio: 19 (ref 12–28)
BUN: 22 mg/dL (ref 8–27)
CO2: 19 mmol/L — ABNORMAL LOW (ref 20–29)
Calcium: 9.1 mg/dL (ref 8.7–10.3)
Chloride: 106 mmol/L (ref 96–106)
Creatinine, Ser: 1.14 mg/dL — ABNORMAL HIGH (ref 0.57–1.00)
Glucose: 94 mg/dL (ref 70–99)
Potassium: 4.8 mmol/L (ref 3.5–5.2)
Sodium: 142 mmol/L (ref 134–144)
eGFR: 49 mL/min/{1.73_m2} — ABNORMAL LOW (ref >59)

## 2023-10-07 LAB — MAGNESIUM: Magnesium: 2.1 mg/dL (ref 1.6–2.3)

## 2023-10-09 ENCOUNTER — Telehealth: Payer: Self-pay

## 2023-10-09 DIAGNOSIS — M0589 Other rheumatoid arthritis with rheumatoid factor of multiple sites: Secondary | ICD-10-CM | POA: Diagnosis not present

## 2023-10-09 NOTE — Telephone Encounter (Signed)
-----   Message from Rip Harbour sent at 10/08/2023 11:52 AM EST ----- Please let [name] know that her kidney function is at her baseline, her potassium and magnesium levels are normal. Overall good results, continue current medications and follow up as planned.

## 2023-10-09 NOTE — Telephone Encounter (Signed)
Left message to call back  

## 2023-10-10 ENCOUNTER — Telehealth: Payer: Self-pay

## 2023-10-10 DIAGNOSIS — F419 Anxiety disorder, unspecified: Secondary | ICD-10-CM | POA: Diagnosis not present

## 2023-10-10 DIAGNOSIS — E559 Vitamin D deficiency, unspecified: Secondary | ICD-10-CM | POA: Diagnosis not present

## 2023-10-10 DIAGNOSIS — I480 Paroxysmal atrial fibrillation: Secondary | ICD-10-CM | POA: Diagnosis not present

## 2023-10-10 DIAGNOSIS — M0589 Other rheumatoid arthritis with rheumatoid factor of multiple sites: Secondary | ICD-10-CM | POA: Diagnosis not present

## 2023-10-10 DIAGNOSIS — I10 Essential (primary) hypertension: Secondary | ICD-10-CM | POA: Diagnosis not present

## 2023-10-10 DIAGNOSIS — E7800 Pure hypercholesterolemia, unspecified: Secondary | ICD-10-CM | POA: Diagnosis not present

## 2023-10-10 NOTE — Telephone Encounter (Signed)
Called patient advised of below they verbalized understanding.

## 2023-10-12 DIAGNOSIS — F419 Anxiety disorder, unspecified: Secondary | ICD-10-CM | POA: Diagnosis not present

## 2023-10-12 DIAGNOSIS — E559 Vitamin D deficiency, unspecified: Secondary | ICD-10-CM | POA: Diagnosis not present

## 2023-10-12 DIAGNOSIS — I10 Essential (primary) hypertension: Secondary | ICD-10-CM | POA: Diagnosis not present

## 2023-10-12 DIAGNOSIS — E7800 Pure hypercholesterolemia, unspecified: Secondary | ICD-10-CM | POA: Diagnosis not present

## 2023-10-12 DIAGNOSIS — I480 Paroxysmal atrial fibrillation: Secondary | ICD-10-CM | POA: Diagnosis not present

## 2023-10-12 DIAGNOSIS — M0589 Other rheumatoid arthritis with rheumatoid factor of multiple sites: Secondary | ICD-10-CM | POA: Diagnosis not present

## 2023-10-13 DIAGNOSIS — E559 Vitamin D deficiency, unspecified: Secondary | ICD-10-CM | POA: Diagnosis not present

## 2023-10-13 DIAGNOSIS — I10 Essential (primary) hypertension: Secondary | ICD-10-CM | POA: Diagnosis not present

## 2023-10-13 DIAGNOSIS — F419 Anxiety disorder, unspecified: Secondary | ICD-10-CM | POA: Diagnosis not present

## 2023-10-13 DIAGNOSIS — I480 Paroxysmal atrial fibrillation: Secondary | ICD-10-CM | POA: Diagnosis not present

## 2023-10-13 DIAGNOSIS — M0589 Other rheumatoid arthritis with rheumatoid factor of multiple sites: Secondary | ICD-10-CM | POA: Diagnosis not present

## 2023-10-13 DIAGNOSIS — E7800 Pure hypercholesterolemia, unspecified: Secondary | ICD-10-CM | POA: Diagnosis not present

## 2023-10-17 ENCOUNTER — Ambulatory Visit: Payer: Medicare Other | Attending: Cardiology | Admitting: *Deleted

## 2023-10-17 DIAGNOSIS — I4819 Other persistent atrial fibrillation: Secondary | ICD-10-CM | POA: Diagnosis not present

## 2023-10-17 DIAGNOSIS — Z5181 Encounter for therapeutic drug level monitoring: Secondary | ICD-10-CM

## 2023-10-17 LAB — POCT INR: INR: 2.7 (ref 2.0–3.0)

## 2023-10-17 NOTE — Patient Instructions (Signed)
Description   Continue taking 2.5mg  daily EXCEPT 1.25mg  on Sundays, Tuesdays, Thursdays and Saturday.   Stay consistent with green each week  (2 per week)  Repeat INR in 3 weeks.  Coumadin Clinic [phone]

## 2023-10-19 DIAGNOSIS — I252 Old myocardial infarction: Secondary | ICD-10-CM | POA: Diagnosis not present

## 2023-10-19 DIAGNOSIS — N201 Calculus of ureter: Secondary | ICD-10-CM | POA: Diagnosis not present

## 2023-10-19 DIAGNOSIS — M359 Systemic involvement of connective tissue, unspecified: Secondary | ICD-10-CM | POA: Diagnosis not present

## 2023-10-19 DIAGNOSIS — F419 Anxiety disorder, unspecified: Secondary | ICD-10-CM | POA: Diagnosis not present

## 2023-10-19 DIAGNOSIS — I480 Paroxysmal atrial fibrillation: Secondary | ICD-10-CM | POA: Diagnosis not present

## 2023-10-19 DIAGNOSIS — M0589 Other rheumatoid arthritis with rheumatoid factor of multiple sites: Secondary | ICD-10-CM | POA: Diagnosis not present

## 2023-10-19 DIAGNOSIS — M549 Dorsalgia, unspecified: Secondary | ICD-10-CM | POA: Diagnosis not present

## 2023-10-19 DIAGNOSIS — U071 COVID-19: Secondary | ICD-10-CM | POA: Diagnosis not present

## 2023-10-19 DIAGNOSIS — R739 Hyperglycemia, unspecified: Secondary | ICD-10-CM | POA: Diagnosis not present

## 2023-10-19 DIAGNOSIS — Z6841 Body Mass Index (BMI) 40.0 and over, adult: Secondary | ICD-10-CM | POA: Diagnosis not present

## 2023-10-19 DIAGNOSIS — I2510 Atherosclerotic heart disease of native coronary artery without angina pectoris: Secondary | ICD-10-CM | POA: Diagnosis not present

## 2023-10-19 DIAGNOSIS — E559 Vitamin D deficiency, unspecified: Secondary | ICD-10-CM | POA: Diagnosis not present

## 2023-10-19 DIAGNOSIS — F32A Depression, unspecified: Secondary | ICD-10-CM | POA: Diagnosis not present

## 2023-10-19 DIAGNOSIS — I10 Essential (primary) hypertension: Secondary | ICD-10-CM | POA: Diagnosis not present

## 2023-10-19 DIAGNOSIS — E7800 Pure hypercholesterolemia, unspecified: Secondary | ICD-10-CM | POA: Diagnosis not present

## 2023-10-19 DIAGNOSIS — G8929 Other chronic pain: Secondary | ICD-10-CM | POA: Diagnosis not present

## 2023-10-19 DIAGNOSIS — Z7901 Long term (current) use of anticoagulants: Secondary | ICD-10-CM | POA: Diagnosis not present

## 2023-10-26 DIAGNOSIS — E7800 Pure hypercholesterolemia, unspecified: Secondary | ICD-10-CM | POA: Diagnosis not present

## 2023-10-26 DIAGNOSIS — E559 Vitamin D deficiency, unspecified: Secondary | ICD-10-CM | POA: Diagnosis not present

## 2023-10-26 DIAGNOSIS — M0589 Other rheumatoid arthritis with rheumatoid factor of multiple sites: Secondary | ICD-10-CM | POA: Diagnosis not present

## 2023-10-26 DIAGNOSIS — I480 Paroxysmal atrial fibrillation: Secondary | ICD-10-CM | POA: Diagnosis not present

## 2023-10-26 DIAGNOSIS — I10 Essential (primary) hypertension: Secondary | ICD-10-CM | POA: Diagnosis not present

## 2023-10-26 DIAGNOSIS — F419 Anxiety disorder, unspecified: Secondary | ICD-10-CM | POA: Diagnosis not present

## 2023-11-01 ENCOUNTER — Telehealth: Payer: Self-pay | Admitting: Cardiology

## 2023-11-01 DIAGNOSIS — M0589 Other rheumatoid arthritis with rheumatoid factor of multiple sites: Secondary | ICD-10-CM | POA: Diagnosis not present

## 2023-11-01 DIAGNOSIS — I480 Paroxysmal atrial fibrillation: Secondary | ICD-10-CM | POA: Diagnosis not present

## 2023-11-01 DIAGNOSIS — E559 Vitamin D deficiency, unspecified: Secondary | ICD-10-CM | POA: Diagnosis not present

## 2023-11-01 DIAGNOSIS — I10 Essential (primary) hypertension: Secondary | ICD-10-CM | POA: Diagnosis not present

## 2023-11-01 DIAGNOSIS — F419 Anxiety disorder, unspecified: Secondary | ICD-10-CM | POA: Diagnosis not present

## 2023-11-01 DIAGNOSIS — E7800 Pure hypercholesterolemia, unspecified: Secondary | ICD-10-CM | POA: Diagnosis not present

## 2023-11-01 MED ORDER — METOPROLOL TARTRATE 25 MG PO TABS: 12.5000 mg | ORAL_TABLET | Freq: Two times a day (BID) | ORAL | Status: DC

## 2023-11-01 NOTE — Telephone Encounter (Signed)
 Spoke with pt, Aware of dr Ludwig Clarks recommendations.

## 2023-11-01 NOTE — Telephone Encounter (Signed)
 Called and spoke to patient. Verified name and DOB. Patient reports that she is doing PT weekly and they always check her vitals. She stated today she was told her HR was 47-48. She doesn't know how long her heart rate has been that low and this is the first time they mentioned it being. She has not been checking her BP lately she she doesn't have any readings. She deny CP, SOB, dizziness, lightheadedness or any other symptoms at this time. She asked if her Metoprolol  should be decrease to half tablet. Please advise.

## 2023-11-01 NOTE — Telephone Encounter (Signed)
 Pt c/o medication issue:  1. Name of Medication:   metoprolol  tartrate (LOPRESSOR ) 25 MG tablet   2. How are you currently taking this medication (dosage and times per day)?   As prescribed  3. Are you having a reaction (difficulty breathing--STAT)?   4. What is your medication issue?   Patient stated her HR has been trending down to around 47-48.  Patient wants to start on 1/2 dosage of this medication.

## 2023-11-02 DIAGNOSIS — E559 Vitamin D deficiency, unspecified: Secondary | ICD-10-CM | POA: Diagnosis not present

## 2023-11-02 DIAGNOSIS — E7800 Pure hypercholesterolemia, unspecified: Secondary | ICD-10-CM | POA: Diagnosis not present

## 2023-11-02 DIAGNOSIS — F419 Anxiety disorder, unspecified: Secondary | ICD-10-CM | POA: Diagnosis not present

## 2023-11-02 DIAGNOSIS — I480 Paroxysmal atrial fibrillation: Secondary | ICD-10-CM | POA: Diagnosis not present

## 2023-11-02 DIAGNOSIS — M0589 Other rheumatoid arthritis with rheumatoid factor of multiple sites: Secondary | ICD-10-CM | POA: Diagnosis not present

## 2023-11-02 DIAGNOSIS — I10 Essential (primary) hypertension: Secondary | ICD-10-CM | POA: Diagnosis not present

## 2023-11-06 ENCOUNTER — Encounter: Payer: Self-pay | Admitting: Cardiology

## 2023-11-06 NOTE — Telephone Encounter (Signed)
 Attempted to call patient, no answer left message requesting a call back.

## 2023-11-07 ENCOUNTER — Ambulatory Visit: Payer: Medicare Other

## 2023-11-07 NOTE — Telephone Encounter (Signed)
 Patient identification verified by 2 forms. Bertina Cooks, RN    Called and spoke to patient  Patient states:   -Went into A-fib on 1/13  -noticed she has excessive urination before going into a-fib   -A-fib will last for 7 days   -has went into A-fib at least 4x in the last year   -Takes Tikosyn  375mcg twice a day   -Metoprolol  was decreased to 12.5mg  BID due to bradycardia on 11/01/23  -Yesterday she increased her metoprolol  to 25mg  twice a day, due to A-fib onset   -HR this morning was 121   -HR at time of call is 142, checked it with Kardia mobile   -felt lightheaded when standing   -feels heart palpitations   -BP today: 164/106 HR: 104, unsure if BP high due to recently walking  Informed patient RN will speak to DOD and call with recommendations  Patient verbalized understanding, no questions at this time

## 2023-11-07 NOTE — Telephone Encounter (Signed)
 RN spoke to DOD Dr. Jordan:   -continue taking Metoprolol  25mg  BID   -if HR consistently elevated above 115BPM in 1-2 days have patient call   -Monitor heart rate at home   _________________________________ Patient identification verified by 2 forms. [name] Cooks, RN    Called and spoke to patient  Relayed DOD recommendations  Patient rechecked BP: 135/106 HR: 101 Patient states:   -took metoprolol  25mg  this morning at 7am, will retake at 7am   -typically A-fib reverts within 7 days  Advised patient to continue monitor heart rate  Reviewed ED warning signs/precautions  Patient verbalized understanding, no questions at this time

## 2023-11-09 ENCOUNTER — Telehealth: Payer: Self-pay

## 2023-11-09 NOTE — Telephone Encounter (Signed)
Copied from CRM [id number]. Topic: Appointments - Appointment Cancel/Reschedule >> Nov 09, 2023 10:50 AM Truddie Crumble wrote: Patient/patient representative is calling to cancel or reschedule an appointment. Refer to attachments for appointment information. Pt called to reschedule an appointment and she wanted to know why she had to reschedule the appointment because she does not see her often

## 2023-11-11 ENCOUNTER — Other Ambulatory Visit: Payer: Self-pay | Admitting: Cardiology

## 2023-11-14 ENCOUNTER — Telehealth: Payer: Self-pay

## 2023-11-14 DIAGNOSIS — M0589 Other rheumatoid arthritis with rheumatoid factor of multiple sites: Secondary | ICD-10-CM | POA: Diagnosis not present

## 2023-11-14 DIAGNOSIS — I10 Essential (primary) hypertension: Secondary | ICD-10-CM | POA: Diagnosis not present

## 2023-11-14 DIAGNOSIS — E7800 Pure hypercholesterolemia, unspecified: Secondary | ICD-10-CM | POA: Diagnosis not present

## 2023-11-14 DIAGNOSIS — E559 Vitamin D deficiency, unspecified: Secondary | ICD-10-CM | POA: Diagnosis not present

## 2023-11-14 DIAGNOSIS — F419 Anxiety disorder, unspecified: Secondary | ICD-10-CM | POA: Diagnosis not present

## 2023-11-14 DIAGNOSIS — I480 Paroxysmal atrial fibrillation: Secondary | ICD-10-CM | POA: Diagnosis not present

## 2023-11-14 NOTE — Telephone Encounter (Signed)
VO given.

## 2023-11-14 NOTE — Telephone Encounter (Signed)
Copied from CRM [id number]. Topic: Clinical - Home Health Verbal Orders >> Nov 14, 2023  1:49 PM Kathryne Eriksson wrote: Caller/Agency: April "[facility]" Callback Number: [phone] Service Requested: Physical Therapy Frequency: Ongoing physical therapy 1 x week for 9 weeks  Any new concerns about the patient? No

## 2023-11-16 ENCOUNTER — Telehealth: Payer: Self-pay

## 2023-11-16 DIAGNOSIS — F419 Anxiety disorder, unspecified: Secondary | ICD-10-CM | POA: Diagnosis not present

## 2023-11-16 DIAGNOSIS — I480 Paroxysmal atrial fibrillation: Secondary | ICD-10-CM | POA: Diagnosis not present

## 2023-11-16 DIAGNOSIS — E559 Vitamin D deficiency, unspecified: Secondary | ICD-10-CM | POA: Diagnosis not present

## 2023-11-16 DIAGNOSIS — E7800 Pure hypercholesterolemia, unspecified: Secondary | ICD-10-CM | POA: Diagnosis not present

## 2023-11-16 DIAGNOSIS — M0589 Other rheumatoid arthritis with rheumatoid factor of multiple sites: Secondary | ICD-10-CM | POA: Diagnosis not present

## 2023-11-16 DIAGNOSIS — I10 Essential (primary) hypertension: Secondary | ICD-10-CM | POA: Diagnosis not present

## 2023-11-16 NOTE — Telephone Encounter (Signed)
Called to speak with [name]. No answer. LVM

## 2023-11-16 NOTE — Telephone Encounter (Signed)
Copied from CRM [id number]. Topic: Clinical - Home Health Verbal Orders >> Nov 16, 2023 10:26 AM Drema Balzarine wrote: Caller/Agency: Radovan from [facility]  Callback Number: [phone] Service Requested: Occupational Therapy Frequency: Patient is being discharged today from OT but is still having physical therapy  Any new concerns about the patient? No

## 2023-11-17 NOTE — Telephone Encounter (Signed)
Just a [place]

## 2023-11-18 ENCOUNTER — Other Ambulatory Visit: Payer: Self-pay | Admitting: Cardiology

## 2023-11-18 DIAGNOSIS — E559 Vitamin D deficiency, unspecified: Secondary | ICD-10-CM | POA: Diagnosis not present

## 2023-11-18 DIAGNOSIS — Z6841 Body Mass Index (BMI) 40.0 and over, adult: Secondary | ICD-10-CM | POA: Diagnosis not present

## 2023-11-18 DIAGNOSIS — Z87442 Personal history of urinary calculi: Secondary | ICD-10-CM | POA: Diagnosis not present

## 2023-11-18 DIAGNOSIS — I2510 Atherosclerotic heart disease of native coronary artery without angina pectoris: Secondary | ICD-10-CM | POA: Diagnosis not present

## 2023-11-18 DIAGNOSIS — M359 Systemic involvement of connective tissue, unspecified: Secondary | ICD-10-CM | POA: Diagnosis not present

## 2023-11-18 DIAGNOSIS — Z7901 Long term (current) use of anticoagulants: Secondary | ICD-10-CM | POA: Diagnosis not present

## 2023-11-18 DIAGNOSIS — F32A Depression, unspecified: Secondary | ICD-10-CM | POA: Diagnosis not present

## 2023-11-18 DIAGNOSIS — G8929 Other chronic pain: Secondary | ICD-10-CM | POA: Diagnosis not present

## 2023-11-18 DIAGNOSIS — I10 Essential (primary) hypertension: Secondary | ICD-10-CM | POA: Diagnosis not present

## 2023-11-18 DIAGNOSIS — I252 Old myocardial infarction: Secondary | ICD-10-CM | POA: Diagnosis not present

## 2023-11-18 DIAGNOSIS — F419 Anxiety disorder, unspecified: Secondary | ICD-10-CM | POA: Diagnosis not present

## 2023-11-18 DIAGNOSIS — Z8616 Personal history of COVID-19: Secondary | ICD-10-CM | POA: Diagnosis not present

## 2023-11-18 DIAGNOSIS — I480 Paroxysmal atrial fibrillation: Secondary | ICD-10-CM | POA: Diagnosis not present

## 2023-11-18 DIAGNOSIS — E7800 Pure hypercholesterolemia, unspecified: Secondary | ICD-10-CM | POA: Diagnosis not present

## 2023-11-18 DIAGNOSIS — M0589 Other rheumatoid arthritis with rheumatoid factor of multiple sites: Secondary | ICD-10-CM | POA: Diagnosis not present

## 2023-11-20 DIAGNOSIS — M0589 Other rheumatoid arthritis with rheumatoid factor of multiple sites: Secondary | ICD-10-CM | POA: Diagnosis not present

## 2023-11-21 ENCOUNTER — Ambulatory Visit: Payer: Medicare Other | Attending: Cardiology

## 2023-11-21 DIAGNOSIS — M359 Systemic involvement of connective tissue, unspecified: Secondary | ICD-10-CM | POA: Diagnosis not present

## 2023-11-21 DIAGNOSIS — Z6841 Body Mass Index (BMI) 40.0 and over, adult: Secondary | ICD-10-CM | POA: Diagnosis not present

## 2023-11-21 DIAGNOSIS — I4891 Unspecified atrial fibrillation: Secondary | ICD-10-CM | POA: Diagnosis not present

## 2023-11-21 DIAGNOSIS — Z5181 Encounter for therapeutic drug level monitoring: Secondary | ICD-10-CM | POA: Insufficient documentation

## 2023-11-21 DIAGNOSIS — I480 Paroxysmal atrial fibrillation: Secondary | ICD-10-CM | POA: Diagnosis not present

## 2023-11-21 DIAGNOSIS — M0589 Other rheumatoid arthritis with rheumatoid factor of multiple sites: Secondary | ICD-10-CM | POA: Diagnosis not present

## 2023-11-21 DIAGNOSIS — E559 Vitamin D deficiency, unspecified: Secondary | ICD-10-CM | POA: Diagnosis not present

## 2023-11-21 LAB — POCT INR: INR: 2.8 (ref 2.0–3.0)

## 2023-11-21 NOTE — Patient Instructions (Signed)
Description   Continue taking 2.5mg  daily EXCEPT 1.25mg  on Sundays, Tuesdays, Thursdays and Saturday.   Stay consistent with green each week  (2 per week)  Repeat INR in 4 weeks.  Coumadin Clinic [phone]

## 2023-11-23 DIAGNOSIS — L299 Pruritus, unspecified: Secondary | ICD-10-CM | POA: Diagnosis not present

## 2023-11-23 DIAGNOSIS — L400 Psoriasis vulgaris: Secondary | ICD-10-CM | POA: Diagnosis not present

## 2023-11-23 DIAGNOSIS — L570 Actinic keratosis: Secondary | ICD-10-CM | POA: Diagnosis not present

## 2023-11-23 DIAGNOSIS — L820 Inflamed seborrheic keratosis: Secondary | ICD-10-CM | POA: Diagnosis not present

## 2023-11-28 ENCOUNTER — Ambulatory Visit: Payer: Medicare Other | Admitting: Podiatry

## 2023-11-30 DIAGNOSIS — M0589 Other rheumatoid arthritis with rheumatoid factor of multiple sites: Secondary | ICD-10-CM | POA: Diagnosis not present

## 2023-11-30 DIAGNOSIS — E559 Vitamin D deficiency, unspecified: Secondary | ICD-10-CM | POA: Diagnosis not present

## 2023-11-30 DIAGNOSIS — M359 Systemic involvement of connective tissue, unspecified: Secondary | ICD-10-CM | POA: Diagnosis not present

## 2023-11-30 DIAGNOSIS — Z6841 Body Mass Index (BMI) 40.0 and over, adult: Secondary | ICD-10-CM | POA: Diagnosis not present

## 2023-11-30 DIAGNOSIS — I480 Paroxysmal atrial fibrillation: Secondary | ICD-10-CM | POA: Diagnosis not present

## 2023-12-06 NOTE — Progress Notes (Signed)
 Cardiology Office Note:  .   Date:  12/07/2023  ID:  KHYLEIGH FURNEY, DOB 1946/03/30, MRN [id number] PCP: Bradd Canary, MD  [facility] Providers Cardiologist:  Olga Millers, MD Electrophysiologist:  Will Jorja Loa, MD    History of Present Illness: .   [name] is a 78 y.o. female with a past medical history of atrial fibrillation on Tikosyn, CAD per CTA of the chest, PVCs, mild carotid artery stenosis, s/p right nephrectomy hypertension, pseudoaneurysm of the right femoral artery, OSA, GERD, IBS, rheumatoid arthritis, dyslipidemia with statin intolerance.   09/12/2023 echo EF 70 to 75%, mild LVH, grade 2 DD, mildly elevated PASP, LA severely dilated, RA mildly dilated, aortic valve sclerosis present without 09/06/2023 CT angio of the chest atherosclerosis of the great vessels of the mediastinum and coronary arteries including calcified atherosclerotic plaque in the LAD 09/03/2020 carotid duplex mild bilateral carotid artery stenosis  Evaluated 07/14/2023 by Dr. Elberta Fortis for consideration of atrial fibrillation ablation.  She was advised her BMI needs to be less than 40, continue current treatment modalities for now.  Most recently she was evaluated by Reather Littler NP on 10/06/2023, she was stable from a cardiac perspective, she was still bothered by occasional breakthrough atrial fibrillation but overall was stable and advised to follow-up in 2 months.  She presents today for follow-up of her atrial fibrillation. She has been keeping a log of her PAF since about April 2024, she has approximately 8 episodes of atrial fibrillation, at times lasting for days.  She is concerned as to why the frequency seems to have increased.  She is working on weight loss, has lost approximately 15 pounds however her BMI is still elevated beyond what is preferred to undergo an ablation.  She offers no formal complaints from a cardiac perspective.  She is tolerating Coumadin without any  adverse side effects.  She would like to meet with the EP again in the future to see if she would be a candidate for the Watchman procedure, really just to see what else is available out there for her. She denies chest pain, palpitations, dyspnea, pnd, orthopnea, n, v, dizziness, syncope, edema, weight gain, or early satiety.   ROS: Review of Systems  Cardiovascular:  Positive for palpitations.  Musculoskeletal:  Positive for back pain and myalgias.  All other systems reviewed and are negative.    Studies Reviewed: Marland Kitchen   EKG Interpretation Date/Time:  Thursday December 07 2023 13:34:27 EST Ventricular Rate:  56 PR Interval:  172 QRS Duration:  82 QT Interval:  470 QTC Calculation: 453 R Axis:   32  Text Interpretation: Sinus bradycardia When compared with ECG of 06-Oct-2023 13:31, No significant change was found Confirmed by Wallis Bamberg [phone]) on 12/07/2023 4:52:41 PM    Cardiac Studies & Procedures   ______________________________________________________________________________________________     ECHOCARDIOGRAM  ECHOCARDIOGRAM COMPLETE 09/12/2023  Narrative ECHOCARDIOGRAM REPORT    Patient Name:   [name] [facility] Date of Exam: 09/12/2023 Medical Rec #:  [id number]        Height:       63.0 in Accession #:    [id number]       Weight:       245.8 lb Date of Birth:  [birth year]       BSA:          2.111 m Patient Age:    77 years         BP:  142/64 mmHg Patient Gender: F                HR:           58 bpm. Exam Location:  Inpatient  Procedure: 2D Echo, 3D Echo, Cardiac Doppler, Color Doppler and Strain Analysis  Indications:    Chest Pain  History:        Patient has prior history of Echocardiogram examinations, most recent 09/10/2020. Risk Factors:Sleep Apnea, Dyslipidemia and Hypertension.  Sonographer:    Karma Ganja Referring Phys: [id number] Jon Billings A REGALADO   Sonographer Comments: Global longitudinal strain was attempted. IMPRESSIONS   1. Left  ventricular ejection fraction, by estimation, is 70 to 75%. Left ventricular ejection fraction by 3D volume is 73 %. The left ventricle has hyperdynamic function. The left ventricle has no regional wall motion abnormalities. There is mild concentric left ventricular hypertrophy. Left ventricular diastolic parameters are consistent with Grade II diastolic dysfunction (pseudonormalization). Elevated left ventricular end-diastolic pressure. The average left ventricular global longitudinal strain is -17.4 %. The global longitudinal strain is normal. 2. Right ventricular systolic function is normal. The right ventricular size is moderately enlarged. There is mildly elevated pulmonary artery systolic pressure. The estimated right ventricular systolic pressure is 44.0 mmHg. 3. Left atrial size was severely dilated. 4. Right atrial size was mildly dilated. 5. The mitral valve is normal in structure. Trivial mitral valve regurgitation. No evidence of mitral stenosis. 6. The aortic valve is tricuspid. Aortic valve regurgitation is not visualized. Aortic valve sclerosis/calcification is present, without any evidence of aortic stenosis. Aortic valve area, by VTI measures 2.35 cm. Aortic valve mean gradient measures 8.0 mmHg. Aortic valve Vmax measures 1.75 m/s. 7. The inferior vena cava is normal in size with greater than 50% respiratory variability, suggesting right atrial pressure of 3 mmHg.  FINDINGS Left Ventricle: Left ventricular ejection fraction, by estimation, is 70 to 75%. Left ventricular ejection fraction by 3D volume is 73 %. The left ventricle has hyperdynamic function. The left ventricle has no regional wall motion abnormalities. The average left ventricular global longitudinal strain is -17.4 %. The global longitudinal strain is normal. The left ventricular internal cavity size was normal in size. There is mild concentric left ventricular hypertrophy. Left ventricular diastolic parameters are  consistent with Grade II diastolic dysfunction (pseudonormalization). Elevated left ventricular end-diastolic pressure.  Right Ventricle: The right ventricular size is moderately enlarged. No increase in right ventricular wall thickness. Right ventricular systolic function is normal. There is mildly elevated pulmonary artery systolic pressure. The tricuspid regurgitant velocity is 3.20 m/s, and with an assumed right atrial pressure of 3 mmHg, the estimated right ventricular systolic pressure is 44.0 mmHg.  Left Atrium: Left atrial size was severely dilated.  Right Atrium: Right atrial size was mildly dilated.  Pericardium: There is no evidence of pericardial effusion.  Mitral Valve: The mitral valve is normal in structure. Trivial mitral valve regurgitation. No evidence of mitral valve stenosis.  Tricuspid Valve: The tricuspid valve is normal in structure. Tricuspid valve regurgitation is mild . No evidence of tricuspid stenosis.  Aortic Valve: The aortic valve is tricuspid. Aortic valve regurgitation is not visualized. Aortic valve sclerosis/calcification is present, without any evidence of aortic stenosis. Aortic valve mean gradient measures 8.0 mmHg. Aortic valve peak gradient measures 12.2 mmHg. Aortic valve area, by VTI measures 2.35 cm.  Pulmonic Valve: The pulmonic valve was normal in structure. Pulmonic valve regurgitation is not visualized. No evidence of pulmonic stenosis.  Aorta: The aortic root  is normal in size and structure.  Venous: The inferior vena cava is normal in size with greater than 50% respiratory variability, suggesting right atrial pressure of 3 mmHg.  IAS/Shunts: No atrial level shunt detected by color flow Doppler.   LEFT VENTRICLE PLAX 2D LVIDd:         4.60 cm         Diastology LVIDs:         2.80 cm         LV e' medial:    6.31 cm/s LV PW:         1.20 cm         LV E/e' medial:  18.4 LV IVS:        1.20 cm         LV e' lateral:   6.64 cm/s LVOT  diam:     1.90 cm         LV E/e' lateral: 17.5 LV SV:         111 LV SV Index:   53              2D LVOT Area:     2.84 cm        Longitudinal Strain 2D Strain GLS  -17.8 % (A2C): 2D Strain GLS  -18.2 % (A3C): 2D Strain GLS  -16.3 % (A4C): 2D Strain GLS  -17.4 % Avg:  3D Volume EF LV 3D EF:    Left ventricul ar ejection fraction by 3D volume is 73 %.  3D Volume EF: 3D EF:        73 % LV EDV:       151 ml LV ESV:       40 ml LV SV:        110 ml  RIGHT VENTRICLE             IVC RV Basal diam:  4.80 cm     IVC diam: 1.50 cm RV S prime:     12.30 cm/s TAPSE (M-mode): 2.4 cm  LEFT ATRIUM              Index        RIGHT ATRIUM           Index LA diam:        5.00 cm  2.37 cm/m   RA Area:     20.00 cm LA Vol (A2C):   92.9 ml  44.01 ml/m  RA Volume:   66.30 ml  31.41 ml/m LA Vol (A4C):   102.0 ml 48.32 ml/m LA Biplane Vol: 97.1 ml  46.00 ml/m AORTIC VALVE AV Area (Vmax):    2.40 cm AV Area (Vmean):   2.26 cm AV Area (VTI):     2.35 cm AV Vmax:           175.00 cm/s AV Vmean:          132.000 cm/s AV VTI:            0.473 m AV Peak Grad:      12.2 mmHg AV Mean Grad:      8.0 mmHg LVOT Vmax:         148.00 cm/s LVOT Vmean:        105.000 cm/s LVOT VTI:          0.392 m LVOT/AV VTI ratio: 0.83  AORTA Ao Root diam: 2.80 cm Ao Asc diam:  2.20 cm  MITRAL VALVE  TRICUSPID VALVE MV Area (PHT): 3.63 cm     TR Peak grad:   41.0 mmHg MV Decel Time: 209 msec     TR Vmax:        320.00 cm/s MV E velocity: 116.00 cm/s MV A velocity: 97.70 cm/s   SHUNTS MV E/A ratio:  1.19         Systemic VTI:  0.39 m Systemic Diam: 1.90 cm  Armanda Magic MD Electronically signed by Armanda Magic MD Signature Date/Time: 09/12/2023/12:23:13 PM    Final    MONITORS  CARDIAC EVENT MONITOR 11/08/2016  Narrative Sinus with pacs, pvcs and ectopic atrial tachycardia Olga Millers        ______________________________________________________________________________________________      Risk Assessment/Calculations:    CHA2DS2-VASc Score = 5   This indicates a 7.2% annual risk of stroke. The patient's score is based upon: CHF History: 0 HTN History: 1 Diabetes History: 0 Stroke History: 0 Vascular Disease History: 1 Age Score: 2 Gender Score: 1    HYPERTENSION CONTROL Vitals:   12/07/23 1320 12/07/23 1655  BP: (!) 160/70 (!) 148/66    The patient's blood pressure is elevated above target today.  In order to address the patient's elevated BP: Blood pressure will be monitored at home to determine if medication changes need to be made.          Physical Exam:   VS:  BP (!) 148/66   Pulse (!) 56   Ht 5\' 3"  (1.6 m)   Wt 243 lb (110.2 kg)   SpO2 98%   BMI 43.05 kg/m    Wt Readings from Last 3 Encounters:  12/07/23 243 lb (110.2 kg)  10/06/23 246 lb (111.6 kg)  09/25/23 245 lb 12.8 oz (111.5 kg)    GEN: Well nourished, well developed in no acute distress NECK: No JVD; No carotid bruits CARDIAC: RRR, no murmurs, rubs, gallops RESPIRATORY:  Clear to auscultation without rales, wheezing or rhonchi  ABDOMEN: Soft, non-tender, non-distended EXTREMITIES:  No edema; No deformity   ASSESSMENT AND PLAN: .   Paroxysmal atrial fibrillation/hypercoagulable state/on Coumadin and Tikosyn-she is maintaining sinus rhythm today, CHA2DS2-VASc score of 5.  Continue Coumadin-as she has previously not tolerated DOAC's.  Continue dofetilide 125 mcg twice daily--QTc 453 MS and narrow.  Continue metoprolol 12.5 mg twice daily.  Recent magnesium on 10/06/2023 is 2.1.  Previously evaluated by EP for discussion of ablation however BMI is greater than 40.  She would like to follow-up with them in a few months just to see if she would qualify for Watchman procedure or any other options.  Will have her see EP in about 3 months.  CAD-nonobstructive per CT of the chest, Stable with  no anginal symptoms. No indication for ischemic evaluation.  Continue metoprolol 12.5 mg twice daily.  Continue nitroglycerin as needed--has not needed.  Hypertension-blood pressure is elevated today 148/66, she does key meticulous record of her blood pressures at home and it appears to be well-controlled at home.  She has previously been on higher doses of Diovan however this led to dizziness and orthostasis.  Continue Diovan 160 mg daily.  Daily Norvasc 2 mg daily.  Continue check blood pressure at home.  OSA-compliant with CPAP.       Dispo: Follow-up with the EP in [place] in 3 months, follow-up with Dr. Jens Som in 6 months.  Signed, Flossie Dibble, NP

## 2023-12-07 ENCOUNTER — Encounter: Payer: Self-pay | Admitting: Cardiology

## 2023-12-07 ENCOUNTER — Ambulatory Visit: Payer: Medicare Other | Attending: Cardiology | Admitting: Cardiology

## 2023-12-07 VITALS — BP 148/66 | HR 56 | Ht 63.0 in | Wt 243.0 lb

## 2023-12-07 DIAGNOSIS — I10 Essential (primary) hypertension: Secondary | ICD-10-CM | POA: Diagnosis present

## 2023-12-07 DIAGNOSIS — I480 Paroxysmal atrial fibrillation: Secondary | ICD-10-CM | POA: Diagnosis present

## 2023-12-07 DIAGNOSIS — Z7901 Long term (current) use of anticoagulants: Secondary | ICD-10-CM

## 2023-12-07 DIAGNOSIS — Z79899 Other long term (current) drug therapy: Secondary | ICD-10-CM | POA: Diagnosis present

## 2023-12-07 DIAGNOSIS — G4733 Obstructive sleep apnea (adult) (pediatric): Secondary | ICD-10-CM | POA: Diagnosis present

## 2023-12-07 DIAGNOSIS — I2510 Atherosclerotic heart disease of native coronary artery without angina pectoris: Secondary | ICD-10-CM

## 2023-12-07 DIAGNOSIS — D6859 Other primary thrombophilia: Secondary | ICD-10-CM

## 2023-12-07 NOTE — Patient Instructions (Signed)
Medication Instructions:  Your physician recommends that you continue on your current medications as directed. Please refer to the Current Medication list given to you today.  *If you need a refill on your cardiac medications before your next appointment, please call your pharmacy*   Lab Work: NONE If you have labs (blood work) drawn today and your tests are completely normal, you will receive your results only by: MyChart Message (if you have MyChart) OR A paper copy in the mail If you have any lab test that is abnormal or we need to change your treatment, we will call you to review the results.   Testing/Procedures: NONE   Follow-Up: At [facility], you and your health needs are our priority.  As part of our continuing mission to provide you with exceptional heart care, we have created designated Provider Care Teams.  These Care Teams include your primary Cardiologist (physician) and Advanced Practice Providers (APPs -  Physician Assistants and Nurse Practitioners) who all work together to provide you with the care you need, when you need it.  We recommend signing up for the patient portal called "MyChart".  Sign up information is provided on this After Visit Summary.  MyChart is used to connect with patients for Virtual Visits (Telemedicine).  Patients are able to view lab/test results, encounter notes, upcoming appointments, etc.  Non-urgent messages can be sent to your provider as well.   To learn more about what you can do with MyChart, go to [email or web address].    Your next appointment:   3 month(s)  Provider:   Andreas Blower MD     Other Instructions 6 month with [facility]

## 2023-12-08 DIAGNOSIS — M0589 Other rheumatoid arthritis with rheumatoid factor of multiple sites: Secondary | ICD-10-CM | POA: Diagnosis not present

## 2023-12-08 DIAGNOSIS — E559 Vitamin D deficiency, unspecified: Secondary | ICD-10-CM | POA: Diagnosis not present

## 2023-12-08 DIAGNOSIS — M359 Systemic involvement of connective tissue, unspecified: Secondary | ICD-10-CM | POA: Diagnosis not present

## 2023-12-08 DIAGNOSIS — I480 Paroxysmal atrial fibrillation: Secondary | ICD-10-CM | POA: Diagnosis not present

## 2023-12-08 DIAGNOSIS — Z6841 Body Mass Index (BMI) 40.0 and over, adult: Secondary | ICD-10-CM | POA: Diagnosis not present

## 2023-12-14 ENCOUNTER — Ambulatory Visit: Payer: Medicare Other | Admitting: Family Medicine

## 2023-12-14 DIAGNOSIS — M0589 Other rheumatoid arthritis with rheumatoid factor of multiple sites: Secondary | ICD-10-CM | POA: Diagnosis not present

## 2023-12-14 DIAGNOSIS — M359 Systemic involvement of connective tissue, unspecified: Secondary | ICD-10-CM | POA: Diagnosis not present

## 2023-12-14 DIAGNOSIS — Z6841 Body Mass Index (BMI) 40.0 and over, adult: Secondary | ICD-10-CM | POA: Diagnosis not present

## 2023-12-14 DIAGNOSIS — I480 Paroxysmal atrial fibrillation: Secondary | ICD-10-CM | POA: Diagnosis not present

## 2023-12-14 DIAGNOSIS — E559 Vitamin D deficiency, unspecified: Secondary | ICD-10-CM | POA: Diagnosis not present

## 2023-12-18 DIAGNOSIS — E7800 Pure hypercholesterolemia, unspecified: Secondary | ICD-10-CM | POA: Diagnosis not present

## 2023-12-18 DIAGNOSIS — F32A Depression, unspecified: Secondary | ICD-10-CM | POA: Diagnosis not present

## 2023-12-18 DIAGNOSIS — Z6841 Body Mass Index (BMI) 40.0 and over, adult: Secondary | ICD-10-CM | POA: Diagnosis not present

## 2023-12-18 DIAGNOSIS — M359 Systemic involvement of connective tissue, unspecified: Secondary | ICD-10-CM | POA: Diagnosis not present

## 2023-12-18 DIAGNOSIS — H209 Unspecified iridocyclitis: Secondary | ICD-10-CM | POA: Diagnosis not present

## 2023-12-18 DIAGNOSIS — Z87442 Personal history of urinary calculi: Secondary | ICD-10-CM | POA: Diagnosis not present

## 2023-12-18 DIAGNOSIS — I10 Essential (primary) hypertension: Secondary | ICD-10-CM | POA: Diagnosis not present

## 2023-12-18 DIAGNOSIS — M0589 Other rheumatoid arthritis with rheumatoid factor of multiple sites: Secondary | ICD-10-CM | POA: Diagnosis not present

## 2023-12-18 DIAGNOSIS — F419 Anxiety disorder, unspecified: Secondary | ICD-10-CM | POA: Diagnosis not present

## 2023-12-18 DIAGNOSIS — I480 Paroxysmal atrial fibrillation: Secondary | ICD-10-CM | POA: Diagnosis not present

## 2023-12-18 DIAGNOSIS — Z8616 Personal history of COVID-19: Secondary | ICD-10-CM | POA: Diagnosis not present

## 2023-12-18 DIAGNOSIS — Z7901 Long term (current) use of anticoagulants: Secondary | ICD-10-CM | POA: Diagnosis not present

## 2023-12-18 DIAGNOSIS — L409 Psoriasis, unspecified: Secondary | ICD-10-CM | POA: Diagnosis not present

## 2023-12-18 DIAGNOSIS — G8929 Other chronic pain: Secondary | ICD-10-CM | POA: Diagnosis not present

## 2023-12-18 DIAGNOSIS — Z85828 Personal history of other malignant neoplasm of skin: Secondary | ICD-10-CM | POA: Diagnosis not present

## 2023-12-18 DIAGNOSIS — I252 Old myocardial infarction: Secondary | ICD-10-CM | POA: Diagnosis not present

## 2023-12-18 DIAGNOSIS — E559 Vitamin D deficiency, unspecified: Secondary | ICD-10-CM | POA: Diagnosis not present

## 2023-12-18 DIAGNOSIS — Q600 Renal agenesis, unilateral: Secondary | ICD-10-CM | POA: Diagnosis not present

## 2023-12-18 DIAGNOSIS — I2510 Atherosclerotic heart disease of native coronary artery without angina pectoris: Secondary | ICD-10-CM | POA: Diagnosis not present

## 2023-12-19 ENCOUNTER — Ambulatory Visit: Payer: Medicare Other | Attending: Cardiology

## 2023-12-19 DIAGNOSIS — I4891 Unspecified atrial fibrillation: Secondary | ICD-10-CM | POA: Diagnosis not present

## 2023-12-19 DIAGNOSIS — Z5181 Encounter for therapeutic drug level monitoring: Secondary | ICD-10-CM | POA: Insufficient documentation

## 2023-12-19 LAB — POCT INR: INR: 2.3 (ref 2.0–3.0)

## 2023-12-19 NOTE — Patient Instructions (Signed)
 Description   Continue taking 2.5mg  daily EXCEPT 1.25mg  on Sundays, Tuesdays, Thursdays and Saturday.   Stay consistent with green each week  (2 per week)  Repeat INR in 5 weeks.  Coumadin Clinic [phone]

## 2023-12-22 ENCOUNTER — Other Ambulatory Visit: Payer: Self-pay | Admitting: Podiatry

## 2023-12-22 ENCOUNTER — Other Ambulatory Visit: Payer: Self-pay | Admitting: Cardiology

## 2023-12-22 DIAGNOSIS — M359 Systemic involvement of connective tissue, unspecified: Secondary | ICD-10-CM | POA: Diagnosis not present

## 2023-12-22 DIAGNOSIS — M0589 Other rheumatoid arthritis with rheumatoid factor of multiple sites: Secondary | ICD-10-CM | POA: Diagnosis not present

## 2023-12-22 DIAGNOSIS — I480 Paroxysmal atrial fibrillation: Secondary | ICD-10-CM | POA: Diagnosis not present

## 2023-12-22 DIAGNOSIS — Z6841 Body Mass Index (BMI) 40.0 and over, adult: Secondary | ICD-10-CM | POA: Diagnosis not present

## 2023-12-22 DIAGNOSIS — E559 Vitamin D deficiency, unspecified: Secondary | ICD-10-CM | POA: Diagnosis not present

## 2023-12-22 MED ORDER — CICLOPIROX 8 % EX SOLN: Freq: Every day | CUTANEOUS | 11 refills | Status: DC

## 2023-12-22 NOTE — Progress Notes (Signed)
 This is Dr. Higinio Plan patient.  Renewal request came to me for ciclopirox solution.  This had been approved but then received a notice from the pharmacy that no instructions were with that original prescription.  Therefore a new prescription was sent in for ciclopirox solution for the toenails today with instructions.

## 2023-12-27 DIAGNOSIS — M0589 Other rheumatoid arthritis with rheumatoid factor of multiple sites: Secondary | ICD-10-CM | POA: Diagnosis not present

## 2023-12-27 DIAGNOSIS — E559 Vitamin D deficiency, unspecified: Secondary | ICD-10-CM | POA: Diagnosis not present

## 2023-12-27 DIAGNOSIS — M359 Systemic involvement of connective tissue, unspecified: Secondary | ICD-10-CM | POA: Diagnosis not present

## 2023-12-27 DIAGNOSIS — I480 Paroxysmal atrial fibrillation: Secondary | ICD-10-CM | POA: Diagnosis not present

## 2023-12-27 DIAGNOSIS — Z6841 Body Mass Index (BMI) 40.0 and over, adult: Secondary | ICD-10-CM | POA: Diagnosis not present

## 2023-12-28 ENCOUNTER — Ambulatory Visit (INDEPENDENT_AMBULATORY_CARE_PROVIDER_SITE_OTHER): Admitting: Podiatry

## 2023-12-28 DIAGNOSIS — M79674 Pain in right toe(s): Secondary | ICD-10-CM

## 2023-12-28 DIAGNOSIS — B351 Tinea unguium: Secondary | ICD-10-CM | POA: Diagnosis not present

## 2023-12-28 DIAGNOSIS — I739 Peripheral vascular disease, unspecified: Secondary | ICD-10-CM | POA: Diagnosis not present

## 2023-12-28 DIAGNOSIS — Z7901 Long term (current) use of anticoagulants: Secondary | ICD-10-CM | POA: Diagnosis not present

## 2023-12-28 DIAGNOSIS — M79675 Pain in left toe(s): Secondary | ICD-10-CM

## 2023-12-28 NOTE — Progress Notes (Signed)
 Subjective:  Patient ID: [name], female    DOB: 12-23-1945,  MRN: [id number]  [name] presents to clinic today for:  Chief Complaint  Patient presents with   RFC    RFC no callous. Not diabetic takes Warrfrin.    Patient notes nails are thick and elongated, causing pain in shoe gear when ambulating.  Patient has a history of Crohn's disease, rheumatoid arthritis, and psoriasis.  Patient also notes that she has possible plantar fasciitis in the left foot and is requesting an x-ray.  She will need to be rescheduled for this additional issue.  She notes that she is interested in custom/prescription shoes with support.  She is not diabetic  PCP is Bradd Canary, MD. last seen around 10/13/2023  Past Medical History:  Diagnosis Date   Anxiety    Arthritis    Atrial fibrillation (HCC) 2008   CAD (coronary artery disease)    Crohn's colitis (HCC)    she reports ulcerative colitis beginning in her 68's, biopsies 2008 suggest Crohn's not UC   Depression    Diverticulosis    GERD (gastroesophageal reflux disease)    Hyperlipidemia    Hypertension    IBS (irritable bowel syndrome)    Keloid of skin    Obesity    OSA (obstructive sleep apnea)    Pancreatitis    Paroxysmal atrial fibrillation (HCC)    Pseudoaneurysm of right femoral artery (HCC)    Renal oncocytoma    s/p right nephrectomy   Rheumatoid arthritis (HCC) 05/29/2017   UC (ulcerative colitis) (HCC)     Allergies  Allergen Reactions   Hydralazine Diarrhea and Nausea Only    Anorexia, upset stomach, burning pain in abdomin   Cefdinir     Pt cannot take; may interfere with AFib.   Codeine     Heart beats fast    Fosamax [Alendronate Sodium] Other (See Comments)    GI upset   Gluten Meal    Guaifenesin Er Nausea And Vomiting and Other (See Comments)    Headaches; can tolerate liquid.   Latex Other (See Comments)    Breathing problems   Percocet [Oxycodone-Acetaminophen]     Itching &  swelling in jaw Pt said she can take tylenol   Psyllium Other (See Comments)    Objective:  [name] is a pleasant 78 y.o. female in NAD. AAO x 3.  Vascular Examination: Patient has palpable DP pulse, absent PT pulse bilateral.  Delayed capillary refill bilateral toes.  Sparse digital hair bilateral.  Proximal to distal cooling WNL bilateral.    Dermatological Examination: Interspaces are clear with no open lesions noted bilateral.  Skin is shiny and atrophic bilateral.  Nails are 3-77mm thick, with yellowish/brown discoloration, subungual debris and distal onycholysis x10.  There is pain with compression of nails x10.        Latest Ref Rng & Units 04/25/2023   11:32 AM  Hemoglobin A1C  Hemoglobin-A1c 4.6 - 6.5 % 5.6    Patient qualifies for at-risk foot care because of PVD.  Assessment/Plan: 1. Pain due to onychomycosis of toenails of both feet   2. PVD (peripheral vascular disease) (HCC)   3. Current use of anticoagulant therapy    Mycotic nails x10 were sharply debrided with sterile nail nippers and power debriding burr to decrease bulk and length.  Will need to contact one of the external durable medical equipment locations to see if Medicare will cover  prescription or custom shoes for rheumatoid arthritis, as this patient is not diabetic and does not have the typical diabetic shoe coverage options.  Return in about 2 weeks (around 01/11/2024) for XR left foot / plantar fasciitis?Clerance Lav, DPM, FACFAS [facility]     [address] [place], [place] [address]                Office [phone]  Fax [phone]

## 2024-01-01 ENCOUNTER — Encounter: Payer: Self-pay | Admitting: Podiatry

## 2024-01-01 DIAGNOSIS — M0589 Other rheumatoid arthritis with rheumatoid factor of multiple sites: Secondary | ICD-10-CM | POA: Diagnosis not present

## 2024-01-03 DIAGNOSIS — Z6841 Body Mass Index (BMI) 40.0 and over, adult: Secondary | ICD-10-CM | POA: Diagnosis not present

## 2024-01-03 DIAGNOSIS — I480 Paroxysmal atrial fibrillation: Secondary | ICD-10-CM | POA: Diagnosis not present

## 2024-01-03 DIAGNOSIS — M359 Systemic involvement of connective tissue, unspecified: Secondary | ICD-10-CM | POA: Diagnosis not present

## 2024-01-03 DIAGNOSIS — M0589 Other rheumatoid arthritis with rheumatoid factor of multiple sites: Secondary | ICD-10-CM | POA: Diagnosis not present

## 2024-01-03 DIAGNOSIS — E559 Vitamin D deficiency, unspecified: Secondary | ICD-10-CM | POA: Diagnosis not present

## 2024-01-08 DIAGNOSIS — E559 Vitamin D deficiency, unspecified: Secondary | ICD-10-CM | POA: Diagnosis not present

## 2024-01-08 DIAGNOSIS — M359 Systemic involvement of connective tissue, unspecified: Secondary | ICD-10-CM | POA: Diagnosis not present

## 2024-01-08 DIAGNOSIS — Z6841 Body Mass Index (BMI) 40.0 and over, adult: Secondary | ICD-10-CM | POA: Diagnosis not present

## 2024-01-08 DIAGNOSIS — M0589 Other rheumatoid arthritis with rheumatoid factor of multiple sites: Secondary | ICD-10-CM | POA: Diagnosis not present

## 2024-01-08 DIAGNOSIS — I480 Paroxysmal atrial fibrillation: Secondary | ICD-10-CM | POA: Diagnosis not present

## 2024-01-10 DIAGNOSIS — L578 Other skin changes due to chronic exposure to nonionizing radiation: Secondary | ICD-10-CM | POA: Diagnosis not present

## 2024-01-10 DIAGNOSIS — C44329 Squamous cell carcinoma of skin of other parts of face: Secondary | ICD-10-CM | POA: Diagnosis not present

## 2024-01-10 DIAGNOSIS — L820 Inflamed seborrheic keratosis: Secondary | ICD-10-CM | POA: Diagnosis not present

## 2024-01-10 DIAGNOSIS — L0100 Impetigo, unspecified: Secondary | ICD-10-CM | POA: Diagnosis not present

## 2024-01-11 ENCOUNTER — Ambulatory Visit: Admitting: Podiatry

## 2024-01-15 DIAGNOSIS — M0589 Other rheumatoid arthritis with rheumatoid factor of multiple sites: Secondary | ICD-10-CM | POA: Diagnosis not present

## 2024-01-15 DIAGNOSIS — Z6841 Body Mass Index (BMI) 40.0 and over, adult: Secondary | ICD-10-CM | POA: Diagnosis not present

## 2024-01-15 DIAGNOSIS — I480 Paroxysmal atrial fibrillation: Secondary | ICD-10-CM | POA: Diagnosis not present

## 2024-01-15 DIAGNOSIS — E559 Vitamin D deficiency, unspecified: Secondary | ICD-10-CM | POA: Diagnosis not present

## 2024-01-15 DIAGNOSIS — M359 Systemic involvement of connective tissue, unspecified: Secondary | ICD-10-CM | POA: Diagnosis not present

## 2024-01-17 ENCOUNTER — Other Ambulatory Visit: Payer: Self-pay | Admitting: Cardiology

## 2024-01-17 DIAGNOSIS — I4891 Unspecified atrial fibrillation: Secondary | ICD-10-CM

## 2024-01-18 ENCOUNTER — Ambulatory Visit (INDEPENDENT_AMBULATORY_CARE_PROVIDER_SITE_OTHER): Admitting: Family Medicine

## 2024-01-18 VITALS — BP 140/82 | HR 51 | Temp 98.0°F | Resp 16 | Ht 61.0 in | Wt 243.2 lb

## 2024-01-18 DIAGNOSIS — E7800 Pure hypercholesterolemia, unspecified: Secondary | ICD-10-CM

## 2024-01-18 DIAGNOSIS — Z789 Other specified health status: Secondary | ICD-10-CM | POA: Diagnosis not present

## 2024-01-18 DIAGNOSIS — E079 Disorder of thyroid, unspecified: Secondary | ICD-10-CM

## 2024-01-18 DIAGNOSIS — E559 Vitamin D deficiency, unspecified: Secondary | ICD-10-CM

## 2024-01-18 DIAGNOSIS — I480 Paroxysmal atrial fibrillation: Secondary | ICD-10-CM | POA: Diagnosis not present

## 2024-01-18 DIAGNOSIS — R739 Hyperglycemia, unspecified: Secondary | ICD-10-CM | POA: Diagnosis not present

## 2024-01-18 DIAGNOSIS — I10 Essential (primary) hypertension: Secondary | ICD-10-CM

## 2024-01-18 DIAGNOSIS — Q602 Renal agenesis, unspecified: Secondary | ICD-10-CM

## 2024-01-18 DIAGNOSIS — M810 Age-related osteoporosis without current pathological fracture: Secondary | ICD-10-CM

## 2024-01-18 DIAGNOSIS — R7989 Other specified abnormal findings of blood chemistry: Secondary | ICD-10-CM

## 2024-01-18 DIAGNOSIS — Q605 Renal hypoplasia, unspecified: Secondary | ICD-10-CM

## 2024-01-18 LAB — COMPREHENSIVE METABOLIC PANEL WITH GFR
ALT: 14 U/L (ref 0–35)
AST: 19 U/L (ref 0–37)
Albumin: 4.1 g/dL (ref 3.5–5.2)
Alkaline Phosphatase: 93 U/L (ref 39–117)
BUN: 18 mg/dL (ref 6–23)
CO2: 30 meq/L (ref 19–32)
Calcium: 9.3 mg/dL (ref 8.4–10.5)
Chloride: 103 meq/L (ref 96–112)
Creatinine, Ser: 0.99 mg/dL (ref 0.40–1.20)
GFR: 54.65 mL/min — ABNORMAL LOW (ref >60.00)
Glucose, Bld: 89 mg/dL (ref 70–99)
Potassium: 4 meq/L (ref 3.5–5.1)
Sodium: 140 meq/L (ref 135–145)
Total Bilirubin: 0.5 mg/dL (ref 0.2–1.2)
Total Protein: 7.9 g/dL (ref 6.0–8.3)

## 2024-01-18 LAB — CBC WITH DIFFERENTIAL/PLATELET
Basophils Absolute: 0.1 10*3/uL (ref 0.0–0.1)
Basophils Relative: 0.7 % (ref 0.0–3.0)
Eosinophils Absolute: 0.2 10*3/uL (ref 0.0–0.7)
Eosinophils Relative: 2 % (ref 0.0–5.0)
HCT: 39.2 % (ref 36.0–46.0)
Hemoglobin: 12.8 g/dL (ref 12.0–15.0)
Lymphocytes Relative: 24.9 % (ref 12.0–46.0)
Lymphs Abs: 2 10*3/uL (ref 0.7–4.0)
MCHC: 32.7 g/dL (ref 30.0–36.0)
MCV: 101 fl — ABNORMAL HIGH (ref 78.0–100.0)
Monocytes Absolute: 0.6 10*3/uL (ref 0.1–1.0)
Monocytes Relative: 7.1 % (ref 3.0–12.0)
Neutro Abs: 5.1 10*3/uL (ref 1.4–7.7)
Neutrophils Relative %: 65.3 % (ref 43.0–77.0)
Platelets: 223 10*3/uL (ref 150.0–400.0)
RBC: 3.88 Mil/uL (ref 3.87–5.11)
RDW: 15.8 % — ABNORMAL HIGH (ref 11.5–15.5)
WBC: 7.9 10*3/uL (ref 4.0–10.5)

## 2024-01-18 LAB — LIPID PANEL
Cholesterol: 174 mg/dL (ref 0–200)
HDL: 51.4 mg/dL (ref >39.00)
LDL Cholesterol: 100 mg/dL — ABNORMAL HIGH (ref 0–99)
NonHDL: 122.92
Total CHOL/HDL Ratio: 3
Triglycerides: 117 mg/dL (ref 0.0–149.0)
VLDL: 23.4 mg/dL (ref 0.0–40.0)

## 2024-01-18 NOTE — Assessment & Plan Note (Signed)
 For past year she has had recurrent afib this past year at least 6 times where the first thing patient notes is increased urination followed by irregular heartbeats, is working with a PA and Dr Hezzie Bump of cardiology now on a more regular basis

## 2024-01-18 NOTE — Patient Instructions (Signed)

## 2024-01-19 ENCOUNTER — Ambulatory Visit (INDEPENDENT_AMBULATORY_CARE_PROVIDER_SITE_OTHER)

## 2024-01-19 ENCOUNTER — Ambulatory Visit (INDEPENDENT_AMBULATORY_CARE_PROVIDER_SITE_OTHER): Admitting: Podiatry

## 2024-01-19 DIAGNOSIS — M069 Rheumatoid arthritis, unspecified: Secondary | ICD-10-CM | POA: Diagnosis not present

## 2024-01-19 DIAGNOSIS — M722 Plantar fascial fibromatosis: Secondary | ICD-10-CM

## 2024-01-19 DIAGNOSIS — M79672 Pain in left foot: Secondary | ICD-10-CM | POA: Diagnosis not present

## 2024-01-19 LAB — TSH: TSH: 4.95 u[IU]/mL (ref 0.35–5.50)

## 2024-01-19 LAB — VITAMIN D 25 HYDROXY (VIT D DEFICIENCY, FRACTURES): VITD: 48.51 ng/mL (ref 30.00–100.00)

## 2024-01-19 LAB — T4, FREE: Free T4: 0.85 ng/dL (ref 0.60–1.60)

## 2024-01-19 NOTE — Patient Instructions (Signed)

## 2024-01-19 NOTE — Progress Notes (Signed)
 Chief Complaint  Patient presents with   Plantar Fasciitis    Here to day for PF of the left foot, x-rays are up in room. She stated that mychart indicated that it was time for her foot exam, she is not diabetic and takes warfrin.    HPI: 78 y.o. female presenting today with c/o pain in the bottom of the left heel.  Denies trauma.  Notes that the pain is in her instep.  And she also notes some pain on the medial heel.  She does have a history that includes rheumatoid arthritis and psoriasis.  She acknowledges a minor psoriatic skin outbreak on the medial aspect of the heel.  Past Medical History:  Diagnosis Date   Anxiety    Arthritis    Atrial fibrillation (HCC) 2008   CAD (coronary artery disease)    Crohn's colitis (HCC)    she reports ulcerative colitis beginning in her 64's, biopsies 2008 suggest Crohn's not UC   Depression    Diverticulosis    GERD (gastroesophageal reflux disease)    Hyperlipidemia    Hypertension    IBS (irritable bowel syndrome)    Keloid of skin    Obesity    OSA (obstructive sleep apnea)    Pancreatitis    Paroxysmal atrial fibrillation (HCC)    Pseudoaneurysm of right femoral artery (HCC)    Renal oncocytoma    s/p right nephrectomy   Rheumatoid arthritis (HCC) 05/29/2017   UC (ulcerative colitis) (HCC)     Past Surgical History:  Procedure Laterality Date   ANGIOPLASTY     BREAST BIOPSY  10/24/1994   benign lesion    Cataract surgery Left 11/13/2014   CHOLECYSTECTOMY  10/24/2006   COLONOSCOPY W/ BIOPSIES     CORONARY ANGIOPLASTY     ESOPHAGOGASTRODUODENOSCOPY     NEPHRECTOMY  10/24/2006   right secondary to oncocytoma    RIGHT FEMORAL  PSEUDOANEURYSM & RIGHT GROIN HEMATOMA EVACUATION  02/22/2007   RIGHT RENAL ARTERY REPAIR  10/24/1974   TUBAL LIGATION      Allergies  Allergen Reactions   Hydralazine Diarrhea and Nausea Only    Anorexia, upset stomach, burning pain in abdomin   Cefdinir     Pt cannot take; may interfere with  AFib.   Codeine     Heart beats fast    Fosamax [Alendronate Sodium] Other (See Comments)    GI upset   Gluten Meal    Guaifenesin Er Nausea And Vomiting and Other (See Comments)    Headaches; can tolerate liquid.   Latex Other (See Comments)    Breathing problems   Percocet [Oxycodone-Acetaminophen]     Itching & swelling in jaw Pt said she can take tylenol   Psyllium Other (See Comments)     Physical Exam: General: The patient is alert and oriented x3 in no acute distress.  Dermatology:  No ecchymosis, erythema, or edema bilateral.  There is erythema and peeling of the skin on the right heel consistent with her psoriatic eruption.  Vascular: Palpable pedal pulses bilaterally. Capillary refill within normal limits.  No appreciable edema.    Neurological: Light touch sensation intact bilateral.  MMT 5/5 to lower extremity bilateral. Negative Tinel's sign with percussion of the posterior tibial nerve on the affected extremity.    Musculoskeletal Exam:  There is pain on palpation of the plantarmedial aspect of right heel and the medial plantar fascial band.  No gaps or nodules within the plantar fascia.  Positive Windlass mechanism bilateral.  Antalgic gait noted with first few steps upon standing.  No pain on palpation of achilles tendon bilateral.  Ankle df less than 10 degrees with knee extended b/l.  Radiographic Exam:  Mild decreased osseous mineralization.  Even joint space narrowing at the first MPJ with long first metatarsal.  There is an inferior calcaneal spur noted with minimal periosteal activity, which can be a common finding with rheumatoid arthritis on x-ray.  Assessment/Plan of Care: 1. Plantar fasciitis of left foot   2. Pain in left foot   3. Rheumatoid arthritis, involving unspecified site, unspecified whether rheumatoid factor present (HCC)    -Reviewed etiology of plantar fasciitis with patient.  Discussed treatment options with patient today, including  cortisone injection, NSAID course of treatment, stretching exercises, physical therapy, use of night splint, rest, icing the heel, arch supports/orthotics, and supportive shoe gear.    Patient dispensed power step orthotics as a courtesy for the delay in her appointment time today.  Reviewed stretching exercises with patient and also recommended that she use a massage ball to the area twice daily.  Will hold off on a cortisone injection since she has a skin eruption in the area.   Return in about 4 weeks (around 02/16/2024) for f/u plantar fasciitis.   Clerance Lav, DPM, FACFAS [facility]     [address] [place], [place] [address]                Office [phone]  Fax [phone]

## 2024-01-20 ENCOUNTER — Encounter: Payer: Self-pay | Admitting: Family Medicine

## 2024-01-20 LAB — HEMOGLOBIN A1C: Hgb A1c MFr Bld: 5.8 % (ref 4.6–6.5)

## 2024-01-20 NOTE — Progress Notes (Signed)
 Subjective:    Patient ID: [name], female    DOB: June 19, 1946, 78 y.o.   MRN: [id number]  Chief Complaint  Patient presents with   Follow-up    HPI Discussed the use of AI scribe software for clinical note transcription with the patient, who gave verbal consent to proceed.  History of Present Illness [name] is a 78 year old female who presents with concerns about an ovarian mass noted in her chart.  She is concerned about an ovarian mass noted in her medical chart, with a date listed as September 11, 2023, which she found confusing. There is no acute pain or symptoms related to this finding. Previous imaging, including CT scans and MRIs from 2014 to 2023, have not shown evidence of an ovarian mass.  She experiences episodes of atrial fibrillation over the past year, occurring approximately six times. Each episode is preceded by excessive urination and lasts about seven days before resolving. She has discussed this with cardiology and is scheduled to see them again in June.  Her last A1c was 5.6, which is normal, but she has had mildly elevated blood sugars in the past. She has a history of diverticulitis, which occasionally causes pain.  She experiences cold intolerance, which may be related to her thyroid function. Her last thyroid test showed an elevated TSH, and she has a family history of thyroid disease.  She had a kidney stone in the left ureter, which was not obstructing and likely passed on its own, as it was not mentioned in subsequent imaging.  She is currently taking Lexapro and finds it helpful for managing stress. She has completed a course of physical therapy and found it beneficial, expressing interest in continuing if needed. She is considering whether to undergo a colonoscopy, as she is approaching the age where routine screening may no longer be recommended.    Past Medical History:  Diagnosis Date   Anxiety    Arthritis    Atrial fibrillation  (HCC) 2008   CAD (coronary artery disease)    Crohn's colitis (HCC)    she reports ulcerative colitis beginning in her 32's, biopsies 2008 suggest Crohn's not UC   Depression    Diverticulosis    GERD (gastroesophageal reflux disease)    Hyperlipidemia    Hypertension    IBS (irritable bowel syndrome)    Keloid of skin    Obesity    OSA (obstructive sleep apnea)    Pancreatitis    Paroxysmal atrial fibrillation (HCC)    Pseudoaneurysm of right femoral artery (HCC)    Renal oncocytoma    s/p right nephrectomy   Rheumatoid arthritis (HCC) 05/29/2017   UC (ulcerative colitis) (HCC)     Past Surgical History:  Procedure Laterality Date   ANGIOPLASTY     BREAST BIOPSY  10/24/1994   benign lesion    Cataract surgery Left 11/13/2014   CHOLECYSTECTOMY  10/24/2006   COLONOSCOPY W/ BIOPSIES     CORONARY ANGIOPLASTY     ESOPHAGOGASTRODUODENOSCOPY     NEPHRECTOMY  10/24/2006   right secondary to oncocytoma    RIGHT FEMORAL  PSEUDOANEURYSM & RIGHT GROIN HEMATOMA EVACUATION  02/22/2007   RIGHT RENAL ARTERY REPAIR  10/24/1974   TUBAL LIGATION      Family History  Problem Relation Age of Onset   Hypertension Mother    Coronary artery disease Mother    Stroke Mother    Clotting disorder Mother    Heart disease Mother  Allergies Mother    Colitis Father    Heart disease Father    Clotting disorder Sister    Stroke Brother    Clotting disorder Brother    Cancer Daughter    Asthma Daughter    Asthma Maternal Uncle    Colon cancer Paternal Aunt    Colon polyps Neg Hx    Kidney disease Neg Hx    Stomach cancer Neg Hx    Esophageal cancer Neg Hx     Social History   Socioeconomic History   Marital status: Married    Spouse name: Not on file   Number of children: 3   Years of education: Not on file   Highest education level: 12th grade  Occupational History   Occupation: Retired    Associate Professor: RETIRED    Comment: retired  Tobacco Use   Smoking status: Never    Smokeless tobacco: Never  Vaping Use   Vaping status: Never Used  Substance and Sexual Activity   Alcohol use: No   Drug use: No   Sexual activity: Never  Other Topics Concern   Not on file  Social History Narrative   Married since 1965 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, 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 - adults, 2 grandchildrenNo pets -Daughter has breast cancer   Social Drivers of Corporate investment banker Strain: Low Risk  (01/17/2024)   Overall Financial Resource Strain (CARDIA)    Difficulty of Paying Living Expenses: Not hard at all  Food Insecurity: No Food Insecurity (01/17/2024)   Hunger Vital Sign    Worried About Running Out of Food in the Last Year: Never true    Ran Out of Food in the Last Year: Never true  Transportation Needs: No Transportation Needs (01/17/2024)   PRAPARE - Administrator, Civil Service (Medical): No    Lack of Transportation (Non-Medical): No  Physical Activity: Patient Declined (01/17/2024)   Exercise Vital Sign    Days of Exercise per Week: Patient declined    Minutes of Exercise per Session: Patient declined  Stress: No Stress Concern Present (01/17/2024)   [employer] of Occupational Health - Occupational Stress Questionnaire    Feeling of Stress : Only a little  Social Connections: Unknown (01/17/2024)   Social Connection and Isolation Panel [NHANES]    Frequency of Communication with Friends and Family: More than three times a week    Frequency of Social Gatherings with Friends and Family: More than three times a week    Attends Religious Services: Patient declined    Active Member of Clubs or Organizations: Yes    Attends Banker Meetings: Patient declined    Marital Status: Married  Catering manager Violence: Not At Risk (09/11/2023)   Humiliation, Afraid, Rape, and Kick questionnaire    Fear of Current or Ex-Partner: No    Emotionally Abused: No    Physically Abused: No    Sexually Abused: No     Outpatient Medications Prior to Visit  Medication Sig Dispense Refill   amLODipine (NORVASC) 10 MG tablet TAKE 1 TABLET BY MOUTH EVERY DAY 90 tablet 3   clobetasol ointment (TEMOVATE) 0.05 % Apply 1 Application topically 2 (two) times daily.     dextrose 5 % SOLN 50 mL with methotrexate 25 MG/ML (PF) SOLN 40 mg/m2 Inject 40 mg/m2 into the vein once a week. Every wednesday     dofetilide (TIKOSYN) 125 MCG capsule TAKE 3 CAPSULES BY MOUTH TWICE A DAY 540 capsule 3  doxazosin (CARDURA) 2 MG tablet TAKE 1 TABLET BY MOUTH EVERY DAY 90 tablet 3   escitalopram (LEXAPRO) 10 MG tablet TAKE 1 TABLET BY MOUTH EVERY DAY 90 tablet 1   esomeprazole (NEXIUM) 40 MG capsule Take 1 capsule (40 mg total) by mouth daily. Take in the morning before breakfast. 30 capsule 11   folic acid (FOLVITE) 1 MG tablet Take 2 mg by mouth daily.     inFLIXimab 10 mg/kg in sodium chloride 0.9 % Inject 10 mg/kg into the vein every 6 (six) weeks.     loperamide (IMODIUM A-D) 2 MG tablet Take 1 mg by mouth as needed. Dihrrhea     Methotrexate Sodium (METHOTREXATE, PF,) 50 MG/2ML injection Inject 2 mLs into the muscle once a week.     metoprolol tartrate (LOPRESSOR) 25 MG tablet Take 0.5 tablets (12.5 mg total) by mouth 2 (two) times daily. 90 tablet 3   Multiple Vitamin (MULTIVITAMIN) capsule Take 1 capsule by mouth daily. 1 daily     nitroGLYCERIN (NITROSTAT) 0.4 MG SL tablet Place 1 tablet (0.4 mg total) under the tongue every 5 (five) minutes as needed for chest pain. Do not exceed 3 tabs in 15 minutes 75 tablet 3   Probiotic Product (PHILLIPS COLON HEALTH) CAPS Take 1 capsule by mouth daily. 1 per day     TUBERCULIN SYR 1CC/27GX1/2" 27G X 1/2" 1 ML MISC      valsartan (DIOVAN) 160 MG tablet Take 1 tablet (160 mg total) by mouth daily. 90 tablet 3   warfarin (COUMADIN) 2.5 MG tablet TAKE 1/2 TABLET TO 1 TABLET BY MOUTH DAILY AS DIRECTED BY COUMADIN CLINIC 75 tablet 1   betamethasone dipropionate 0.05 % cream Apply 1  Application topically 2 (two) times daily as needed (psoriasis). (Patient not taking: Reported on 01/18/2024)     No facility-administered medications prior to visit.    Allergies  Allergen Reactions   Hydralazine Diarrhea and Nausea Only    Anorexia, upset stomach, burning pain in abdomin   Cefdinir     Pt cannot take; may interfere with AFib.   Codeine     Heart beats fast    Fosamax [Alendronate Sodium] Other (See Comments)    GI upset   Gluten Meal    Guaifenesin Er Nausea And Vomiting and Other (See Comments)    Headaches; can tolerate liquid.   Latex Other (See Comments)    Breathing problems   Percocet [Oxycodone-Acetaminophen]     Itching & swelling in jaw Pt said she can take tylenol   Psyllium Other (See Comments)    Review of Systems  Constitutional:  Positive for malaise/fatigue. Negative for fever.  HENT:  Negative for congestion.   Eyes:  Negative for blurred vision.  Respiratory:  Negative for shortness of breath.   Cardiovascular:  Negative for chest pain, palpitations and leg swelling.  Gastrointestinal:  Negative for abdominal pain, blood in stool and nausea.  Genitourinary:  Negative for dysuria and frequency.  Musculoskeletal:  Negative for falls.  Skin:  Negative for rash.  Neurological:  Negative for dizziness, loss of consciousness and headaches.  Endo/Heme/Allergies:  Negative for environmental allergies.  Psychiatric/Behavioral:  Negative for depression. The patient is nervous/anxious.        Objective:    Physical Exam Constitutional:      General: She is not in acute distress.    Appearance: Normal appearance. She is well-developed. She is not toxic-appearing.  HENT:     Head: Normocephalic and atraumatic.  Right Ear: External ear normal.     Left Ear: External ear normal.     Nose: Nose normal.  Eyes:     General:        Right eye: No discharge.        Left eye: No discharge.     Conjunctiva/sclera: Conjunctivae normal.  Neck:      Thyroid: No thyromegaly.  Cardiovascular:     Rate and Rhythm: Normal rate and regular rhythm.     Heart sounds: Normal heart sounds. No murmur heard. Pulmonary:     Effort: Pulmonary effort is normal. No respiratory distress.     Breath sounds: Normal breath sounds.  Abdominal:     General: Bowel sounds are normal.     Palpations: Abdomen is soft.     Tenderness: There is no abdominal tenderness. There is no guarding.  Musculoskeletal:        General: Normal range of motion.     Cervical back: Neck supple.  Lymphadenopathy:     Cervical: No cervical adenopathy.  Skin:    General: Skin is warm and dry.  Neurological:     Mental Status: She is alert and oriented to person, place, and time.  Psychiatric:        Mood and Affect: Mood normal.        Behavior: Behavior normal.        Thought Content: Thought content normal.        Judgment: Judgment normal.     BP (!) 140/82 (BP Location: Left Arm, Patient Position: Sitting, Cuff Size: Large)   Pulse (!) 51   Temp 98 F (36.7 C) (Oral)   Resp 16   Ht 5\' 1"  (1.549 m)   Wt 243 lb 3.2 oz (110.3 kg)   SpO2 98%   BMI 45.95 kg/m  Wt Readings from Last 3 Encounters:  01/18/24 243 lb 3.2 oz (110.3 kg)  12/07/23 243 lb (110.2 kg)  10/06/23 246 lb (111.6 kg)    Diabetic Foot Exam - Simple   No data filed    Lab Results  Component Value Date   WBC 7.9 01/18/2024   HGB 12.8 01/18/2024   HCT 39.2 01/18/2024   PLT 223.0 01/18/2024   GLUCOSE 89 01/18/2024   CHOL 174 01/18/2024   TRIG 117.0 01/18/2024   HDL 51.40 01/18/2024   LDLCALC 100 (H) 01/18/2024   ALT 14 01/18/2024   AST 19 01/18/2024   NA 140 01/18/2024   K 4.0 01/18/2024   CL 103 01/18/2024   CREATININE 0.99 01/18/2024   BUN 18 01/18/2024   CO2 30 01/18/2024   TSH 4.95 01/18/2024   INR 2.3 12/19/2023   HGBA1C 5.8 01/18/2024   MICROALBUR 3.8 (H) 04/25/2023    Lab Results  Component Value Date   TSH 4.95 01/18/2024   Lab Results  Component Value  Date   WBC 7.9 01/18/2024   HGB 12.8 01/18/2024   HCT 39.2 01/18/2024   MCV 101.0 (H) 01/18/2024   PLT 223.0 01/18/2024   Lab Results  Component Value Date   NA 140 01/18/2024   K 4.0 01/18/2024   CO2 30 01/18/2024   GLUCOSE 89 01/18/2024   BUN 18 01/18/2024   CREATININE 0.99 01/18/2024   BILITOT 0.5 01/18/2024   ALKPHOS 93 01/18/2024   AST 19 01/18/2024   ALT 14 01/18/2024   PROT 7.9 01/18/2024   ALBUMIN 4.1 01/18/2024   CALCIUM 9.3 01/18/2024   ANIONGAP 7 09/13/2023   EGFR  49 (L) 10/06/2023   GFR 54.65 (L) 01/18/2024   Lab Results  Component Value Date   CHOL 174 01/18/2024   Lab Results  Component Value Date   HDL 51.40 01/18/2024   Lab Results  Component Value Date   LDLCALC 100 (H) 01/18/2024   Lab Results  Component Value Date   TRIG 117.0 01/18/2024   Lab Results  Component Value Date   CHOLHDL 3 01/18/2024   Lab Results  Component Value Date   HGBA1C 5.8 01/18/2024       Assessment & Plan:  Paroxysmal atrial fibrillation (HCC) Assessment & Plan: For past year she has had recurrent afib this past year at least 6 times where the first thing patient notes is increased urination followed by irregular heartbeats, is working with a PA and Dr Hezzie Bump of cardiology now on a more regular basis   Essential hypertension -     Comprehensive metabolic panel with GFR -     CBC with Differential/Platelet -     TSH  Hyperglycemia -     Hemoglobin A1c  Pure hypercholesterolemia -     Lipid panel  Renal agenesis and dysgenesis  Osteoporosis, unspecified osteoporosis type, unspecified pathological fracture presence  Vitamin D deficiency -     VITAMIN D 25 Hydroxy (Vit-D Deficiency, Fractures)  Statin intolerance -     Lipid panel  Elevated TSH -     TSH -     T4, free  Thyroid disease -     T4, free    Assessment and Plan Assessment & Plan Atrial Fibrillation Recurrent episodes over the past year with unusual presentation. Normal A1c  rules out diabetes as a cause. Coordinated with cardiology for further management. - Coordinate with cardiology for further management.  Hyperglycemia Mildly elevated blood glucose with normal A1c. Monitoring and dietary guidance as needed. - Check A1c to monitor glucose levels. - Provide dietary guidance if A1c indicates prediabetes or diabetes.  Thyroid Dysfunction Elevated TSH suggests possible hypothyroidism. Recheck TSH and free T4 to assess function. - Check TSH and free T4 levels.  General Health Maintenance Discussed colonoscopy risks and benefits for a 78 year old. Advised to consider screening decision with gastroenterologist consultation. - Consider gastroenterologist consultation regarding colonoscopy. - Decide on colonoscopy by year-end.     Danise Edge, MD

## 2024-01-23 ENCOUNTER — Ambulatory Visit: Payer: Medicare Other | Attending: Cardiology

## 2024-01-23 DIAGNOSIS — Z5181 Encounter for therapeutic drug level monitoring: Secondary | ICD-10-CM | POA: Diagnosis not present

## 2024-01-23 DIAGNOSIS — I4891 Unspecified atrial fibrillation: Secondary | ICD-10-CM | POA: Insufficient documentation

## 2024-01-23 LAB — POCT INR: INR: 1.7 — AB (ref 2.0–3.0)

## 2024-01-23 NOTE — Patient Instructions (Signed)
 Description   Take 1 tablet today and then continue taking 2.5mg  daily EXCEPT 1.25mg  on Sundays, Tuesdays, Thursdays and Saturday.   Stay consistent with green each week  (2 per week)  Repeat INR in 4 weeks.  [facility] [phone]

## 2024-01-24 DIAGNOSIS — D0439 Carcinoma in situ of skin of other parts of face: Secondary | ICD-10-CM | POA: Diagnosis not present

## 2024-02-12 DIAGNOSIS — M0589 Other rheumatoid arthritis with rheumatoid factor of multiple sites: Secondary | ICD-10-CM | POA: Diagnosis not present

## 2024-02-16 ENCOUNTER — Ambulatory Visit (INDEPENDENT_AMBULATORY_CARE_PROVIDER_SITE_OTHER): Admitting: Podiatry

## 2024-02-16 DIAGNOSIS — M722 Plantar fascial fibromatosis: Secondary | ICD-10-CM

## 2024-02-16 NOTE — Patient Instructions (Signed)

## 2024-02-16 NOTE — Progress Notes (Signed)
 Chief Complaint  Patient presents with   Plantar Fasciitis    4 week follow up. She states the inserts have truly helped. She is having very little PF pain. She does still have RA pain but is getting infusions which helps. Last A1c was 5.8 and takes warrferin   HPI: 78 y.o. female presents today for follow-up of left plantar fasciitis.  She states that she really likes the power step arch supports.  She feels that she is approximately 75% better at this time.  She has not been doing the stretching exercises because they were not dispensed at last visit.  Past Medical History:  Diagnosis Date   Anxiety    Arthritis    Atrial fibrillation (HCC) 2008   CAD (coronary artery disease)    Crohn's colitis (HCC)    she reports ulcerative colitis beginning in her 70's, biopsies 2008 suggest Crohn's not UC   Depression    Diverticulosis    GERD (gastroesophageal reflux disease)    Hyperlipidemia    Hypertension    IBS (irritable bowel syndrome)    Keloid of skin    Obesity    OSA (obstructive sleep apnea)    Pancreatitis    Paroxysmal atrial fibrillation (HCC)    Pseudoaneurysm of right femoral artery (HCC)    Renal oncocytoma    s/p right nephrectomy   Rheumatoid arthritis (HCC) 05/29/2017   UC (ulcerative colitis) (HCC)     Past Surgical History:  Procedure Laterality Date   ANGIOPLASTY     BREAST BIOPSY  10/24/1994   benign lesion    Cataract surgery Left 11/13/2014   CHOLECYSTECTOMY  10/24/2006   COLONOSCOPY W/ BIOPSIES     CORONARY ANGIOPLASTY     ESOPHAGOGASTRODUODENOSCOPY     NEPHRECTOMY  10/24/2006   right secondary to oncocytoma    RIGHT FEMORAL  PSEUDOANEURYSM & RIGHT GROIN HEMATOMA EVACUATION  02/22/2007   RIGHT RENAL ARTERY REPAIR  10/24/1974   TUBAL LIGATION      Allergies  Allergen Reactions   Hydralazine  Diarrhea and Nausea Only    Anorexia, upset stomach, burning pain in abdomin   Cefdinir      Pt cannot take; may interfere with AFib.   Codeine      Heart beats fast    Fosamax [Alendronate Sodium] Other (See Comments)    GI upset   Gluten Meal    Guaifenesin Er Nausea And Vomiting and Other (See Comments)    Headaches; can tolerate liquid.   Latex Other (See Comments)    Breathing problems   Percocet [Oxycodone -Acetaminophen ]     Itching & swelling in jaw Pt said she can take tylenol    Psyllium Other (See Comments)    Physical Exam: Palpable pedal pulses noted.  Minimal pain on palpation to the plantar medial aspect of the left heel.  No edema or erythema or ecchymosis is noted.  Assessment/Plan of Care: 1. Plantar fasciitis of left foot     Discussed clinical findings with patient today.  The stretching exercises were printed and dispensed at patient checkout today.  She will continue with the power step inserts daily.  Follow-up as needed   Joe Murders, DPM, FACFAS [facility]     [address]  [place], [place] [address]                Office [phone]  Fax [phone]

## 2024-02-20 ENCOUNTER — Ambulatory Visit: Attending: Cardiology

## 2024-02-20 DIAGNOSIS — Z5181 Encounter for therapeutic drug level monitoring: Secondary | ICD-10-CM | POA: Diagnosis not present

## 2024-02-20 DIAGNOSIS — I4891 Unspecified atrial fibrillation: Secondary | ICD-10-CM | POA: Diagnosis not present

## 2024-02-20 LAB — POCT INR: INR: 1.8 — AB (ref 2.0–3.0)

## 2024-02-20 NOTE — Patient Instructions (Signed)
 Description   Take 1 tablet today and then START taking 2.5mg  daily EXCEPT 1.25mg  on Sundays, Tuesdays and Thursdays.    Stay consistent with green each week  (2 per week)  Repeat INR in 2 weeks.  Coumadin  Clinic [phone]

## 2024-03-04 ENCOUNTER — Encounter: Payer: Self-pay | Admitting: Cardiology

## 2024-03-05 ENCOUNTER — Ambulatory Visit: Attending: Cardiology

## 2024-03-05 DIAGNOSIS — I4891 Unspecified atrial fibrillation: Secondary | ICD-10-CM | POA: Diagnosis not present

## 2024-03-05 DIAGNOSIS — Z5181 Encounter for therapeutic drug level monitoring: Secondary | ICD-10-CM | POA: Insufficient documentation

## 2024-03-05 LAB — POCT INR: INR: 2.9 (ref 2.0–3.0)

## 2024-03-05 NOTE — Patient Instructions (Signed)
 Description   Continue what you have been taking taking 2.5mg  daily EXCEPT 1.25mg  on Tuesdays and Thursdays.    Stay consistent with green each week  (2 per week)  Repeat INR in 3 weeks.  Coumadin  Clinic [phone]

## 2024-03-06 ENCOUNTER — Ambulatory Visit (HOSPITAL_COMMUNITY)
Admission: RE | Admit: 2024-03-06 | Discharge: 2024-03-06 | Disposition: A | Discharge status: Home / Self Care | Source: Ambulatory Visit | Attending: Physician Assistant | Admitting: Physician Assistant

## 2024-03-06 ENCOUNTER — Inpatient Hospital Stay (HOSPITAL_COMMUNITY)
Admission: RE | Admit: 2024-03-06 | Discharge: 2024-03-06 | Disposition: A | Discharge status: Home / Self Care | Source: Ambulatory Visit | Attending: Physician Assistant

## 2024-03-06 ENCOUNTER — Encounter (HOSPITAL_COMMUNITY): Payer: Self-pay | Admitting: Physician Assistant

## 2024-03-06 VITALS — BP 144/60 | HR 57 | Ht 61.0 in | Wt 243.0 lb

## 2024-03-06 DIAGNOSIS — I480 Paroxysmal atrial fibrillation: Secondary | ICD-10-CM | POA: Diagnosis not present

## 2024-03-06 DIAGNOSIS — D6869 Other thrombophilia: Secondary | ICD-10-CM | POA: Diagnosis not present

## 2024-03-06 DIAGNOSIS — Z5181 Encounter for therapeutic drug level monitoring: Secondary | ICD-10-CM | POA: Diagnosis not present

## 2024-03-06 DIAGNOSIS — Z79899 Other long term (current) drug therapy: Secondary | ICD-10-CM | POA: Diagnosis not present

## 2024-03-06 DIAGNOSIS — I4819 Other persistent atrial fibrillation: Secondary | ICD-10-CM | POA: Diagnosis not present

## 2024-03-06 NOTE — Progress Notes (Signed)
 Primary Care Physician: Neda Balk, MD Primary Cardiologist: Alexandria Angel, MD Electrophysiologist: Will Cortland Ding, MD  Referring Physician: Dr Atilano Blander AVNOOR WUNDERLE is a 78 y.o. female with a history of CAD, HTN, HLD, OSA, carotid disease, RA, CKD, atrial fibrillation who presents for follow up in the [facility]. She has a longstanding history of atrial fibrillation. She has been on Tikosyn  for many years. Patient is on warfarin for stroke prevention.   Patient presents today for follow up for atrial fibrillation and dofetilide  monitoring. She reports that over the past week she has had "spells" where she has tunnel vision, palpitations, and dizziness. These spells last for several seconds. She is in SR today. No bleeding issues on anticoagulation.   Today, she denies symptoms of chest pain, shortness of breath, orthopnea, PND, lower extremity edema, syncope, snoring, daytime somnolence, bleeding, or neurologic sequela. The patient is tolerating medications without difficulties and is otherwise without complaint today.    Atrial Fibrillation Risk Factors:  she does have symptoms or diagnosis of sleep apnea. she does not have a history of rheumatic fever.   Atrial Fibrillation Management history:  Previous antiarrhythmic drugs: amiodarone, Tikosyn  Previous ablations: none Anticoagulation history: warfarin  ROS- All systems are reviewed and negative except as per the HPI above.  Past Medical History:  Diagnosis Date   Anxiety    Arthritis    Atrial fibrillation (HCC) 2008   CAD (coronary artery disease)    Crohn's colitis (HCC)    she reports ulcerative colitis beginning in her 9's, biopsies 2008 suggest Crohn's not UC   Depression    Diverticulosis    GERD (gastroesophageal reflux disease)    Hyperlipidemia    Hypertension    IBS (irritable bowel syndrome)    Keloid of skin    Obesity    OSA (obstructive sleep apnea)     Pancreatitis    Paroxysmal atrial fibrillation (HCC)    Pseudoaneurysm of right femoral artery (HCC)    Renal oncocytoma    s/p right nephrectomy   Rheumatoid arthritis (HCC) 05/29/2017   UC (ulcerative colitis) (HCC)     Current Outpatient Medications  Medication Sig Dispense Refill   amLODipine  (NORVASC ) 10 MG tablet TAKE 1 TABLET BY MOUTH EVERY DAY 90 tablet 3   clobetasol ointment (TEMOVATE) 0.05 % Apply 1 Application topically 2 (two) times daily.     dextrose 5 % SOLN 50 mL with methotrexate 25 MG/ML (PF) SOLN 40 mg/m2 Inject 40 mg/m2 into the vein once a week. Every wednesday     dofetilide  (TIKOSYN ) 125 MCG capsule TAKE 3 CAPSULES BY MOUTH TWICE A DAY 540 capsule 3   doxazosin  (CARDURA ) 2 MG tablet TAKE 1 TABLET BY MOUTH EVERY DAY 90 tablet 3   escitalopram  (LEXAPRO ) 10 MG tablet TAKE 1 TABLET BY MOUTH EVERY DAY 90 tablet 1   esomeprazole  (NEXIUM ) 40 MG capsule Take 1 capsule (40 mg total) by mouth daily. Take in the morning before breakfast. 30 capsule 11   folic acid (FOLVITE) 1 MG tablet Take 2 mg by mouth daily.     inFLIXimab  10 mg/kg in sodium chloride  0.9 % Inject 10 mg/kg into the vein every 6 (six) weeks.     loperamide (IMODIUM A-D) 2 MG tablet Take 1 mg by mouth as needed. Dihrrhea     Methotrexate Sodium (METHOTREXATE, PF,) 50 MG/2ML injection Inject 2 mLs into the muscle once a week.     metoprolol   tartrate (LOPRESSOR ) 25 MG tablet Take 0.5 tablets (12.5 mg total) by mouth 2 (two) times daily. 90 tablet 3   Multiple Vitamin (MULTIVITAMIN) capsule Take 1 capsule by mouth daily. 1 daily     nitroGLYCERIN  (NITROSTAT ) 0.4 MG SL tablet Place 1 tablet (0.4 mg total) under the tongue every 5 (five) minutes as needed for chest pain. Do not exceed 3 tabs in 15 minutes 75 tablet 3   Probiotic Product (PHILLIPS COLON HEALTH) CAPS Take 1 capsule by mouth daily. 1 per day     TUBERCULIN SYR 1CC/27GX1/2" 27G X 1/2" 1 ML MISC      valsartan  (DIOVAN ) 160 MG tablet Take 1 tablet (160  mg total) by mouth daily. 90 tablet 3   warfarin (COUMADIN ) 2.5 MG tablet TAKE 1/2 TABLET TO 1 TABLET BY MOUTH DAILY AS DIRECTED BY COUMADIN  CLINIC 75 tablet 1   No current facility-administered medications for this encounter.    Physical Exam: BP (!) 144/60   Pulse (!) 57   Ht 5\' 1"  (1.549 m)   Wt 110.2 kg   BMI 45.91 kg/m   GEN: Well nourished, well developed in no acute distress CARDIAC: Regular rate and rhythm, no murmurs, rubs, gallops RESPIRATORY:  Clear to auscultation without rales, wheezing or rhonchi  ABDOMEN: Soft, non-tender, non-distended EXTREMITIES:  No edema; No deformity   Wt Readings from Last 3 Encounters:  03/06/24 110.2 kg  01/18/24 110.3 kg  12/07/23 110.2 kg     EKG today demonstrates  SB, PAC Vent. rate 57 BPM PR interval 164 ms QRS duration 84 ms QT/QTcB 452/439 ms  Echo 09/12/23 demonstrated   1. Left ventricular ejection fraction, by estimation, is 70 to 75%. Left  ventricular ejection fraction by 3D volume is 73 %. The left ventricle has  hyperdynamic function. The left ventricle has no regional wall motion  abnormalities. There is mild concentric left ventricular hypertrophy. Left ventricular diastolic parameters are consistent with Grade II diastolic dysfunction (pseudonormalization). Elevated left ventricular end-diastolic pressure. The average left ventricular global longitudinal strain is -17.4 %. The global longitudinal strain is normal.   2. Right ventricular systolic function is normal. The right ventricular  size is moderately enlarged. There is mildly elevated pulmonary artery  systolic pressure. The estimated right ventricular systolic pressure is  44.0 mmHg.   3. Left atrial size was severely dilated.   4. Right atrial size was mildly dilated.   5. The mitral valve is normal in structure. Trivial mitral valve  regurgitation. No evidence of mitral stenosis.   6. The aortic valve is tricuspid. Aortic valve regurgitation is not   visualized. Aortic valve sclerosis/calcification is present, without any  evidence of aortic stenosis. Aortic valve area, by VTI measures 2.35 cm.  Aortic valve mean gradient measures  8.0 mmHg. Aortic valve Vmax measures 1.75 m/s.   7. The inferior vena cava is normal in size with greater than 50%  respiratory variability, suggesting right atrial pressure of 3 mmHg.     CHA2DS2-VASc Score = 5  The patient's score is based upon: CHF History: 0 HTN History: 1 Diabetes History: 0 Stroke History: 0 Vascular Disease History: 1 Age Score: 2 Gender Score: 1       ASSESSMENT AND PLAN: Persistent Atrial Fibrillation (ICD10:  I48.19) The patient's CHA2DS2-VASc score is 5, indicating a 7.2% annual risk of stroke.   Patient in SR today. Having spells of dizziness, palpitations, and tunnel vision lasting for several seconds. ? If she is having post termination  pauses.  Will have her wear a 2 week Zio monitor to evaluate.  Continue dofetilide  375 mcg BID Continue Lopressor  12.5 mg BID Continue warfarin If she fails dofetilide , she does not want to rechallenge amiodarone due to intolerable side effects (hallucinations).   Secondary Hypercoagulable State (ICD10:  D68.69) The patient is at significant risk for stroke/thromboembolism based upon her CHA2DS2-VASc Score of 5.  Continue Warfarin (Coumadin ).   High Risk Medication Monitoring (ICD 10: Z79.899) QT interval on ECG acceptable for dofetilide  monitoring.   CAD No anginal symptoms Followed by Dr Audery Blazing  OSA  Encouraged nightly CPAP  HTN Stable on current regimen    Follow up in the AF clinic in one month.      Myrtha Ates PA-C [facility] [address] [place], [place] [address] [phone]

## 2024-03-13 DIAGNOSIS — K5090 Crohn's disease, unspecified, without complications: Secondary | ICD-10-CM | POA: Diagnosis not present

## 2024-03-13 DIAGNOSIS — H209 Unspecified iridocyclitis: Secondary | ICD-10-CM | POA: Diagnosis not present

## 2024-03-13 DIAGNOSIS — L409 Psoriasis, unspecified: Secondary | ICD-10-CM | POA: Diagnosis not present

## 2024-03-13 DIAGNOSIS — Z6841 Body Mass Index (BMI) 40.0 and over, adult: Secondary | ICD-10-CM | POA: Diagnosis not present

## 2024-03-13 DIAGNOSIS — M0589 Other rheumatoid arthritis with rheumatoid factor of multiple sites: Secondary | ICD-10-CM | POA: Diagnosis not present

## 2024-03-13 DIAGNOSIS — Q600 Renal agenesis, unilateral: Secondary | ICD-10-CM | POA: Diagnosis not present

## 2024-03-25 ENCOUNTER — Ambulatory Visit: Payer: Medicare Other | Admitting: Cardiology

## 2024-03-25 ENCOUNTER — Ambulatory Visit: Payer: Medicare Other | Admitting: Family Medicine

## 2024-03-25 DIAGNOSIS — Z79899 Other long term (current) drug therapy: Secondary | ICD-10-CM | POA: Diagnosis not present

## 2024-03-25 DIAGNOSIS — M0589 Other rheumatoid arthritis with rheumatoid factor of multiple sites: Secondary | ICD-10-CM | POA: Diagnosis not present

## 2024-03-26 ENCOUNTER — Ambulatory Visit: Attending: Cardiology

## 2024-03-26 DIAGNOSIS — Z5181 Encounter for therapeutic drug level monitoring: Secondary | ICD-10-CM | POA: Diagnosis not present

## 2024-03-26 DIAGNOSIS — I4891 Unspecified atrial fibrillation: Secondary | ICD-10-CM | POA: Insufficient documentation

## 2024-03-26 LAB — POCT INR: INR: 3.7 — AB (ref 2.0–3.0)

## 2024-03-26 NOTE — Patient Instructions (Addendum)
 Description   HOLD today's dose and then START taking  2.5mg  daily EXCEPT 1.25mg  on Sunday, Tuesdays, and Thursdays Stay consistent with green each week  (2 per week)  Repeat INR in 2 weeks.  Coumadin  Clinic [phone]

## 2024-03-29 ENCOUNTER — Ambulatory Visit (HOSPITAL_COMMUNITY): Payer: Self-pay | Admitting: Physician Assistant

## 2024-03-29 DIAGNOSIS — I480 Paroxysmal atrial fibrillation: Secondary | ICD-10-CM | POA: Diagnosis not present

## 2024-04-03 ENCOUNTER — Ambulatory Visit (INDEPENDENT_AMBULATORY_CARE_PROVIDER_SITE_OTHER): Admitting: Podiatry

## 2024-04-03 DIAGNOSIS — L409 Psoriasis, unspecified: Secondary | ICD-10-CM

## 2024-04-03 DIAGNOSIS — M79674 Pain in right toe(s): Secondary | ICD-10-CM | POA: Diagnosis not present

## 2024-04-03 DIAGNOSIS — B351 Tinea unguium: Secondary | ICD-10-CM | POA: Diagnosis not present

## 2024-04-03 DIAGNOSIS — M79675 Pain in left toe(s): Secondary | ICD-10-CM | POA: Diagnosis not present

## 2024-04-03 NOTE — Progress Notes (Signed)
 Subjective:  Patient ID: [name], female    DOB: 05-27-1946,  MRN: [id number]  [name] presents to clinic today for:  Chief Complaint  Patient presents with   [facility]    RFC with out callous. Not diabetic and takes Warrfrin.    Patient notes nails are thick, discolored, elongated and painful in shoegear when trying to ambulate.  Patient notes that she has been dealing with psoriasis eruptions on her feet.  She notes that she does have some steroid creams at home that she can use.  She typically gets methotrexate injections, but states that this has been in short supply and she is not getting as much of it as she normally would, so this has been causing the flareups recently.  She uses a walker to assist with ambulation.  She has several inflammatory conditions including irritable bowel syndrome/Crohn's colitis/ulcerative colitis, rheumatoid arthritis, and psoriasis.  PCP is Neda Balk, MD.  Past Medical History:  Diagnosis Date   Anxiety    Arthritis    Atrial fibrillation (HCC) 2008   CAD (coronary artery disease)    Crohn's colitis (HCC)    she reports ulcerative colitis beginning in her 70's, biopsies 2008 suggest Crohn's not UC   Depression    Diverticulosis    GERD (gastroesophageal reflux disease)    Hyperlipidemia    Hypertension    IBS (irritable bowel syndrome)    Keloid of skin    Obesity    OSA (obstructive sleep apnea)    Pancreatitis    Paroxysmal atrial fibrillation (HCC)    Pseudoaneurysm of right femoral artery (HCC)    Renal oncocytoma    s/p right nephrectomy   Rheumatoid arthritis (HCC) 05/29/2017   UC (ulcerative colitis) (HCC)    Past Surgical History:  Procedure Laterality Date   ANGIOPLASTY     BREAST BIOPSY  10/24/1994   benign lesion    Cataract surgery Left 11/13/2014   CHOLECYSTECTOMY  10/24/2006   COLONOSCOPY W/ BIOPSIES     CORONARY ANGIOPLASTY     ESOPHAGOGASTRODUODENOSCOPY     NEPHRECTOMY  10/24/2006   right  secondary to oncocytoma    RIGHT FEMORAL  PSEUDOANEURYSM & RIGHT GROIN HEMATOMA EVACUATION  02/22/2007   RIGHT RENAL ARTERY REPAIR  10/24/1974   TUBAL LIGATION     Allergies  Allergen Reactions   Hydralazine  Diarrhea and Nausea Only    Anorexia, upset stomach, burning pain in abdomin   Cefdinir      Pt cannot take; may interfere with AFib.   Codeine     Heart beats fast    Fosamax [Alendronate Sodium] Other (See Comments)    GI upset   Gluten Meal    Guaifenesin Er Nausea And Vomiting and Other (See Comments)    Headaches; can tolerate liquid.   Latex Other (See Comments)    Breathing problems   Percocet [Oxycodone -Acetaminophen ]     Itching & swelling in jaw Pt said she can take tylenol    Psyllium Other (See Comments)    Review of Systems: Negative except as noted in the HPI.  Objective:  [name] is a pleasant 78 y.o. female in NAD. AAO x 3.  Vascular Examination: Capillary refill time is 3-5 seconds to toes bilateral. Palpable pedal pulses b/l LE. Digital hair present b/l.  Skin temperature gradient WNL b/l. No varicosities b/l. No cyanosis noted b/l.   Dermatological Examination: Pedal skin with normal turgor, texture and tone b/l. No open wounds. No  interdigital macerations b/l. Toenails x10 are 3mm thick, discolored, dystrophic with subungual debris. There is pain with compression of the nail plates.  They are elongated x10.  There is redness and peeling with silvery scaling around the periphery of both heels.  No blister formation is seen.     Latest Ref Rng & Units 01/18/2024   11:24 AM 04/25/2023   11:32 AM  Hemoglobin A1C  Hemoglobin-A1c 4.6 - 6.5 % 5.8  5.6    Assessment/Plan: 1. Pain due to onychomycosis of toenails of both feet   2. Psoriasis    The mycotic toenails were sharply debrided x10 with sterile nail nippers and a power debriding burr to decrease bulk/thickness and length.    Discussed speaking with her dermatologist/rheumatologist about  the possible benefits of light therapy for the psoriatic skin eruptions.  This may be of added benefit, especially since she is not able to get her regular dosing of methotrexate at this time due to medication shortage.  She may also use her steroid creams at home in the meantime.  The patient can use OptiNail solution on the toenails as a nail brightness and a mild antifungal.  This is over-the-counter.  Return in about 3 months (around 07/04/2024) for RFC.   Joe Murders, DPM, FACFAS [facility]     [address] [place], [place] [address]                Office [phone]  Fax [phone].

## 2024-04-08 ENCOUNTER — Telehealth: Payer: Self-pay | Admitting: Cardiology

## 2024-04-08 ENCOUNTER — Ambulatory Visit: Admitting: Cardiology

## 2024-04-08 NOTE — Telephone Encounter (Signed)
 Pt c/o medication issue:  1. Name of Medication: dofetilide  (TIKOSYN ) 125 MCG capsule   2. How are you currently taking this medication (dosage and times per day)? TAKE 3 CAPSULES BY MOUTH TWICE A DAY   3. Are you having a reaction (difficulty breathing--STAT)? No  4. What is your medication issue? Patient had to cancel her appointment and wanted to inform Dr. Lawana Pray that should like to keep taking the Tikosyn  medication instead of the suggested alternative. Please advise.

## 2024-04-08 NOTE — Telephone Encounter (Signed)
 Left message to call back

## 2024-04-09 ENCOUNTER — Ambulatory Visit

## 2024-04-10 ENCOUNTER — Ambulatory Visit (HOSPITAL_COMMUNITY): Admitting: Physician Assistant

## 2024-04-11 ENCOUNTER — Telehealth: Payer: Self-pay | Admitting: Internal Medicine

## 2024-04-11 ENCOUNTER — Encounter: Payer: Self-pay | Admitting: Family Medicine

## 2024-04-11 NOTE — Telephone Encounter (Signed)
 Inbound call from patient stating in the past she was having back pain she thought was cardiac related. Stated she was advised by her rheumatologist that it may be pancreatic cancer. Patient is currently scheduled for 8/1. Requesting a call to discuss details further, please advise, thank you. - VD

## 2024-04-11 NOTE — Telephone Encounter (Signed)
 Spoke with the patient. She would like to address this sooner if possible. Last seen here 2 years ago. Patient is on methotrexate. She has Crohn's. Appointment scheduled with Dr Willy Harvest 04/17/24 at 10:10 am.

## 2024-04-16 ENCOUNTER — Ambulatory Visit: Attending: Cardiology

## 2024-04-16 DIAGNOSIS — Z5181 Encounter for therapeutic drug level monitoring: Secondary | ICD-10-CM | POA: Diagnosis not present

## 2024-04-16 DIAGNOSIS — I4891 Unspecified atrial fibrillation: Secondary | ICD-10-CM | POA: Insufficient documentation

## 2024-04-16 LAB — POCT INR: INR: 2.3 (ref 2.0–3.0)

## 2024-04-16 NOTE — Patient Instructions (Signed)
 Description   Continue taking  2.5mg  daily EXCEPT 1.25mg  on Sunday, Tuesdays, and Thursdays Stay consistent with green each week  (2 per week)  Repeat INR in 3 weeks.  Coumadin  Clinic [phone]

## 2024-04-16 NOTE — Telephone Encounter (Signed)
 Left message to call back

## 2024-04-17 ENCOUNTER — Encounter: Payer: Self-pay | Admitting: Internal Medicine

## 2024-04-17 ENCOUNTER — Other Ambulatory Visit (INDEPENDENT_AMBULATORY_CARE_PROVIDER_SITE_OTHER)

## 2024-04-17 ENCOUNTER — Ambulatory Visit (INDEPENDENT_AMBULATORY_CARE_PROVIDER_SITE_OTHER): Admitting: Internal Medicine

## 2024-04-17 VITALS — BP 146/72 | HR 85 | Ht 63.0 in | Wt 241.0 lb

## 2024-04-17 DIAGNOSIS — K5010 Crohn's disease of large intestine without complications: Secondary | ICD-10-CM

## 2024-04-17 DIAGNOSIS — K641 Second degree hemorrhoids: Secondary | ICD-10-CM | POA: Diagnosis not present

## 2024-04-17 DIAGNOSIS — K648 Other hemorrhoids: Secondary | ICD-10-CM

## 2024-04-17 DIAGNOSIS — Z5181 Encounter for therapeutic drug level monitoring: Secondary | ICD-10-CM | POA: Diagnosis not present

## 2024-04-17 DIAGNOSIS — D7589 Other specified diseases of blood and blood-forming organs: Secondary | ICD-10-CM

## 2024-04-17 DIAGNOSIS — Z7901 Long term (current) use of anticoagulants: Secondary | ICD-10-CM | POA: Diagnosis not present

## 2024-04-17 DIAGNOSIS — K625 Hemorrhage of anus and rectum: Secondary | ICD-10-CM

## 2024-04-17 LAB — CBC WITH DIFFERENTIAL/PLATELET
Basophils Absolute: 0.1 10*3/uL (ref 0.0–0.1)
Basophils Relative: 0.8 % (ref 0.0–3.0)
Eosinophils Absolute: 0.1 10*3/uL (ref 0.0–0.7)
Eosinophils Relative: 1.7 % (ref 0.0–5.0)
HCT: 35.8 % — ABNORMAL LOW (ref 36.0–46.0)
Hemoglobin: 11.9 g/dL — ABNORMAL LOW (ref 12.0–15.0)
Lymphocytes Relative: 23.9 % (ref 12.0–46.0)
Lymphs Abs: 1.7 10*3/uL (ref 0.7–4.0)
MCHC: 33.2 g/dL (ref 30.0–36.0)
MCV: 96.5 fl (ref 78.0–100.0)
Monocytes Absolute: 0.7 10*3/uL (ref 0.1–1.0)
Monocytes Relative: 9.7 % (ref 3.0–12.0)
Neutro Abs: 4.6 10*3/uL (ref 1.4–7.7)
Neutrophils Relative %: 63.9 % (ref 43.0–77.0)
Platelets: 213 10*3/uL (ref 150.0–400.0)
RBC: 3.71 Mil/uL — ABNORMAL LOW (ref 3.87–5.11)
RDW: 15.6 % — ABNORMAL HIGH (ref 11.5–15.5)
WBC: 7.2 10*3/uL (ref 4.0–10.5)

## 2024-04-17 LAB — VITAMIN B12: Vitamin B-12: 459 pg/mL (ref 211–911)

## 2024-04-17 MED ORDER — HYDROCORTISONE ACETATE 25 MG RE SUPP: 25.0000 mg | Freq: Two times a day (BID) | RECTAL | 1 refills | Status: AC | PRN

## 2024-04-17 NOTE — Progress Notes (Signed)
 [name] 78 y.o. 09/17/1946 [id number]  Assessment & Plan:   Encounter Diagnoses  Name Primary?   Rectal bleeding Yes   Internal hemorrhoids    Crohn's disease of colon without complication (HCC)    Macrocytosis    Warfarin anticoagulation      I think most likely hemorrhoidal bleeding triggered by transient constpation but w/ hx Crohn's and clon polyps colorectal cancer is possible. She declined colonoscopy and is aware of possibly missing cancer.  I have prescribed Anusol  HC suppositories to be used as needed in the future.  CBC and B12 level checked due to bleeding and macrocytosis.   Lab Results  Component Value Date   WBC 7.2 04/17/2024   HGB 11.9 (L) 04/17/2024   HCT 35.8 (L) 04/17/2024   MCV 96.5 04/17/2024   PLT 213.0 04/17/2024    Lab Results  Component Value Date   VITAMINB12 459 04/17/2024  \  Subjective:   Chief Complaint: Crohn's disease, rectal bleeding Patient consented to the use of artificial intelligence scribe function HPI 78 year old woman with Crohn's colitis and history of adenomatous polyps last seen May 2023 by Greig Corti PA-C.  At that point she had a heme positive stool.  Patient declined a colonoscopy.  She is on Remicade  for rheumatoid arthritis.  Other problems include A-fib on warfarin. Daughter is present and participates.  She experienced what she thinks was a flare of her Crohn's disease starting on April 05, 2024, which she attributes to increased physical activity while spending time with her sister and brother-in-law. During this period, she experienced constipation for two days, followed by the passage of blood and mucus in her stools. These symptoms persisted for a few days but have since resolved.  She did not use any laxatives.  She recalls a previous instance where suppositories were prescribed, which provided relief. She maintained a regimen of a liquid diet for a couple of days during such episodes and ensures  cleanliness to manage her symptoms.  She has a history of hemorrhoids, which became inflamed during the recent flare. She is currently using a walker and continues to take warfarin.  She is concerned about pancreatic cancer due to episodes of pain under her left shoulder blade, but recent MRI and CT scans within the past year showed no issues with her pancreas. She has a history of triple-negative breast cancer, for which she underwent chemotherapy and radiation.  CT abdomen and pelvis 09/10/2023 diverticulosis, left ovarian phlebolith MRI due to prior suspicious abnormality of left kidney on imaging - 09/13/2023 status post right nephrectomy  Last colonoscopy 2013 normal mucosa and biopsies w/o active IBD   Allergies  Allergen Reactions   Hydralazine  Diarrhea and Nausea Only    Anorexia, upset stomach, burning pain in abdomin   Cefdinir      Pt cannot take; may interfere with AFib.   Codeine     Heart beats fast    Fosamax [Alendronate Sodium] Other (See Comments)    GI upset   Gluten Meal    Guaifenesin Er Nausea And Vomiting and Other (See Comments)    Headaches; can tolerate liquid.   Latex Other (See Comments)    Breathing problems   Percocet [Oxycodone -Acetaminophen ]     Itching & swelling in jaw Pt said she can take tylenol    Psyllium Other (See Comments)   Current Meds  Medication Sig   amLODipine  (NORVASC ) 10 MG tablet TAKE 1 TABLET BY MOUTH EVERY DAY   clobetasol ointment (TEMOVATE) 0.05 %  Apply 1 Application topically 2 (two) times daily.   dextrose 5 % SOLN 50 mL with methotrexate 25 MG/ML (PF) SOLN 40 mg/m2 Inject 40 mg/m2 into the vein once a week. Every wednesday   dofetilide  (TIKOSYN ) 125 MCG capsule TAKE 3 CAPSULES BY MOUTH TWICE A DAY   doxazosin  (CARDURA ) 2 MG tablet TAKE 1 TABLET BY MOUTH EVERY DAY   escitalopram  (LEXAPRO ) 10 MG tablet TAKE 1 TABLET BY MOUTH EVERY DAY   esomeprazole  (NEXIUM ) 40 MG capsule Take 1 capsule (40 mg total) by mouth daily. Take in  the morning before breakfast.   folic acid (FOLVITE) 1 MG tablet Take 2 mg by mouth daily.   hydrocortisone  (ANUSOL -HC) 25 MG suppository Place 1 suppository (25 mg total) rectally 2 (two) times daily as needed for hemorrhoids or anal itching.   inFLIXimab  10 mg/kg in sodium chloride  0.9 % Inject 10 mg/kg into the vein every 6 (six) weeks.   loperamide (IMODIUM A-D) 2 MG tablet Take 1 mg by mouth as needed. Dihrrhea   metoprolol  tartrate (LOPRESSOR ) 25 MG tablet Take 0.5 tablets (12.5 mg total) by mouth 2 (two) times daily.   Multiple Vitamin (MULTIVITAMIN) capsule Take 1 capsule by mouth daily. 1 daily   nitroGLYCERIN  (NITROSTAT ) 0.4 MG SL tablet Place 1 tablet (0.4 mg total) under the tongue every 5 (five) minutes as needed for chest pain. Do not exceed 3 tabs in 15 minutes   Probiotic Product (PHILLIPS COLON HEALTH) CAPS Take 1 capsule by mouth daily. 1 per day   TUBERCULIN SYR 1CC/27GX1/2 27G X 1/2 1 ML MISC    valsartan  (DIOVAN ) 160 MG tablet Take 1 tablet (160 mg total) by mouth daily.   warfarin (COUMADIN ) 2.5 MG tablet TAKE 1/2 TABLET TO 1 TABLET BY MOUTH DAILY AS DIRECTED BY COUMADIN  CLINIC   Past Medical History:  Diagnosis Date   Anxiety    Arthritis    Atrial fibrillation (HCC) 2008   CAD (coronary artery disease)    Crohn's colitis (HCC)    she reports ulcerative colitis beginning in her 50's, biopsies 2008 suggest Crohn's not UC   Depression    Diverticulosis    GERD (gastroesophageal reflux disease)    Hyperlipidemia    Hypertension    IBS (irritable bowel syndrome)    Keloid of skin    Obesity    OSA (obstructive sleep apnea)    Pancreatitis    Paroxysmal atrial fibrillation (HCC)    Pseudoaneurysm of right femoral artery (HCC)    Renal oncocytoma    s/p right nephrectomy   Rheumatoid arthritis (HCC) 05/29/2017   UC (ulcerative colitis) (HCC)    Past Surgical History:  Procedure Laterality Date   ANGIOPLASTY     BREAST BIOPSY  10/24/1994   benign lesion     Cataract surgery Left 11/13/2014   CHOLECYSTECTOMY  10/24/2006   COLONOSCOPY W/ BIOPSIES     CORONARY ANGIOPLASTY     ESOPHAGOGASTRODUODENOSCOPY     NEPHRECTOMY  10/24/2006   right secondary to oncocytoma    RIGHT FEMORAL  PSEUDOANEURYSM & RIGHT GROIN HEMATOMA EVACUATION  02/22/2007   RIGHT RENAL ARTERY REPAIR  10/24/1974   TUBAL LIGATION     Social History   Social History Narrative   Married since 1965 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   Review of Systems In PT using a walker  Objective:   Physical Exam BP (!) 146/72   Pulse 85   Ht 5' 3 (1.6 m)   Wt 241 lb (109.3 kg)   BMI 42.69 kg/m  Obese ww NAD Lungs cta Cor NL S1S2 no rmg Abd obese, soft NT  Rectal Heavenly CMA present Anoderm inspection revealed tiny anterior pile Digital exam revealed no masses, nontender and no blood. Tan stool..   Anoscopy - Gr 2 erythematous internal hemorrhoids all positions

## 2024-04-17 NOTE — Progress Notes (Signed)
Please see anticoagulation encounter.

## 2024-04-17 NOTE — Patient Instructions (Signed)
 Your provider has requested that you go to the basement level for lab work before leaving today. Press B on the elevator. The lab is located at the first door on the left as you exit the elevator.  Due to recent changes in healthcare laws, you may see the results of your imaging and laboratory studies on MyChart before your provider has had a chance to review them.  We understand that in some cases there may be results that are confusing or concerning to you. Not all laboratory results come back in the same time frame and the provider may be waiting for multiple results in order to interpret others.  Please give us  48 hours in order for your provider to thoroughly review all the results before contacting the office for clarification of your results.   We have sent the following medications to your pharmacy for you to pick up at your convenience: Hydrocortisone  suppositories  Call us  back if you continue to have issues.   I appreciate the opportunity to care for you. Lupita Commander, MD, [facility]

## 2024-04-19 ENCOUNTER — Ambulatory Visit: Payer: Self-pay | Admitting: Internal Medicine

## 2024-05-06 DIAGNOSIS — M0589 Other rheumatoid arthritis with rheumatoid factor of multiple sites: Secondary | ICD-10-CM | POA: Diagnosis not present

## 2024-05-06 NOTE — Telephone Encounter (Signed)
 Left message to call back

## 2024-05-07 ENCOUNTER — Ambulatory Visit: Attending: Cardiology

## 2024-05-07 DIAGNOSIS — Z5181 Encounter for therapeutic drug level monitoring: Secondary | ICD-10-CM | POA: Insufficient documentation

## 2024-05-07 DIAGNOSIS — I4891 Unspecified atrial fibrillation: Secondary | ICD-10-CM | POA: Insufficient documentation

## 2024-05-07 LAB — POCT INR: INR: 2 (ref 2.0–3.0)

## 2024-05-07 NOTE — Patient Instructions (Signed)
 Description   Take 1 tablet today and then continue taking  2.5mg  daily EXCEPT 1.25mg  on Sunday, Tuesdays, and Thursdays Stay consistent with green each week  (2 per week)  Repeat INR in 4 weeks.  Coumadin  Clinic [phone]

## 2024-05-07 NOTE — Progress Notes (Signed)
Please see anticoagulation encounter.

## 2024-05-24 ENCOUNTER — Ambulatory Visit: Admitting: Gastroenterology

## 2024-05-27 DIAGNOSIS — L821 Other seborrheic keratosis: Secondary | ICD-10-CM | POA: Diagnosis not present

## 2024-05-27 DIAGNOSIS — L570 Actinic keratosis: Secondary | ICD-10-CM | POA: Diagnosis not present

## 2024-05-27 DIAGNOSIS — L300 Nummular dermatitis: Secondary | ICD-10-CM | POA: Diagnosis not present

## 2024-05-27 DIAGNOSIS — D0439 Carcinoma in situ of skin of other parts of face: Secondary | ICD-10-CM | POA: Diagnosis not present

## 2024-05-27 DIAGNOSIS — D225 Melanocytic nevi of trunk: Secondary | ICD-10-CM | POA: Diagnosis not present

## 2024-06-03 ENCOUNTER — Ambulatory Visit: Admitting: Cardiology

## 2024-06-04 ENCOUNTER — Ambulatory Visit: Attending: Cardiology

## 2024-06-04 DIAGNOSIS — Z5181 Encounter for therapeutic drug level monitoring: Secondary | ICD-10-CM | POA: Insufficient documentation

## 2024-06-04 DIAGNOSIS — I4891 Unspecified atrial fibrillation: Secondary | ICD-10-CM | POA: Diagnosis not present

## 2024-06-04 LAB — POCT INR: INR: 2.3 (ref 2.0–3.0)

## 2024-06-04 NOTE — Patient Instructions (Signed)
 Description   Continue taking  2.5mg  daily EXCEPT 1.25mg  on Sunday, Tuesdays, and Thursdays Stay consistent with green each week  (2 per week)  Repeat INR in 5 weeks.  Coumadin  Clinic [phone]

## 2024-06-04 NOTE — Progress Notes (Signed)
 INR-2.3; Please see anticoagulation encounter

## 2024-06-09 DIAGNOSIS — M25519 Pain in unspecified shoulder: Secondary | ICD-10-CM | POA: Insufficient documentation

## 2024-06-09 DIAGNOSIS — D649 Anemia, unspecified: Secondary | ICD-10-CM | POA: Insufficient documentation

## 2024-06-09 NOTE — Assessment & Plan Note (Signed)
 hgba1c acceptable, minimize simple carbs. Increase exercise as tolerated.

## 2024-06-09 NOTE — Assessment & Plan Note (Signed)
 Increase leafy greens, consider increased lean red meat and using cast iron cookware. Continue to monitor, report any concerns

## 2024-06-09 NOTE — Assessment & Plan Note (Signed)
 Encourage heart healthy diet such as MIND or DASH diet, increase exercise, avoid trans fats, simple carbohydrates and processed foods, consider a krill or fish or flaxseed oil cap daily.

## 2024-06-09 NOTE — Assessment & Plan Note (Signed)
 Encouraged moist heat and gentle stretching as tolerated. May try NSAIDs and prescription meds as directed and report if symptoms worsen or seek immediate care

## 2024-06-09 NOTE — Assessment & Plan Note (Signed)
 Well controlled, no changes to meds. Encouraged heart healthy diet such as the DASH diet and exercise as tolerated.

## 2024-06-09 NOTE — Assessment & Plan Note (Signed)
 No recent exacerbation

## 2024-06-10 ENCOUNTER — Ambulatory Visit: Admitting: Family Medicine

## 2024-06-10 ENCOUNTER — Encounter: Payer: Self-pay | Admitting: Family Medicine

## 2024-06-10 VITALS — BP 116/82 | HR 52 | Resp 16 | Ht 63.0 in | Wt 238.2 lb

## 2024-06-10 DIAGNOSIS — I480 Paroxysmal atrial fibrillation: Secondary | ICD-10-CM

## 2024-06-10 DIAGNOSIS — M25519 Pain in unspecified shoulder: Secondary | ICD-10-CM

## 2024-06-10 DIAGNOSIS — E7800 Pure hypercholesterolemia, unspecified: Secondary | ICD-10-CM | POA: Diagnosis not present

## 2024-06-10 DIAGNOSIS — I159 Secondary hypertension, unspecified: Secondary | ICD-10-CM | POA: Diagnosis not present

## 2024-06-10 DIAGNOSIS — R739 Hyperglycemia, unspecified: Secondary | ICD-10-CM

## 2024-06-10 DIAGNOSIS — D649 Anemia, unspecified: Secondary | ICD-10-CM | POA: Diagnosis not present

## 2024-06-10 LAB — COMPREHENSIVE METABOLIC PANEL WITH GFR
ALT: 13 U/L (ref 0–35)
AST: 18 U/L (ref 0–37)
Albumin: 3.4 g/dL — ABNORMAL LOW (ref 3.5–5.2)
Alkaline Phosphatase: 79 U/L (ref 39–117)
BUN: 17 mg/dL (ref 6–23)
CO2: 25 meq/L (ref 19–32)
Calcium: 8.6 mg/dL (ref 8.4–10.5)
Chloride: 104 meq/L (ref 96–112)
Creatinine, Ser: 1.09 mg/dL (ref 0.40–1.20)
GFR: 48.56 mL/min — ABNORMAL LOW (ref >60.00)
Glucose, Bld: 150 mg/dL — ABNORMAL HIGH (ref 70–99)
Potassium: 3.9 meq/L (ref 3.5–5.1)
Sodium: 140 meq/L (ref 135–145)
Total Bilirubin: 0.5 mg/dL (ref 0.2–1.2)
Total Protein: 6.5 g/dL (ref 6.0–8.3)

## 2024-06-10 LAB — LIPID PANEL
Cholesterol: 159 mg/dL (ref 0–200)
HDL: 47.3 mg/dL (ref >39.00)
LDL Cholesterol: 92 mg/dL (ref 0–99)
NonHDL: 111.38
Total CHOL/HDL Ratio: 3
Triglycerides: 96 mg/dL (ref 0.0–149.0)
VLDL: 19.2 mg/dL (ref 0.0–40.0)

## 2024-06-10 LAB — CBC WITH DIFFERENTIAL/PLATELET
Basophils Absolute: 0.1 K/uL (ref 0.0–0.1)
Basophils Relative: 0.8 % (ref 0.0–3.0)
Eosinophils Absolute: 0.1 K/uL (ref 0.0–0.7)
Eosinophils Relative: 1.1 % (ref 0.0–5.0)
HCT: 36.5 % (ref 36.0–46.0)
Hemoglobin: 11.9 g/dL — ABNORMAL LOW (ref 12.0–15.0)
Lymphocytes Relative: 24.1 % (ref 12.0–46.0)
Lymphs Abs: 1.6 K/uL (ref 0.7–4.0)
MCHC: 32.7 g/dL (ref 30.0–36.0)
MCV: 98.5 fl (ref 78.0–100.0)
Monocytes Absolute: 0.4 K/uL (ref 0.1–1.0)
Monocytes Relative: 5.5 % (ref 3.0–12.0)
Neutro Abs: 4.5 K/uL (ref 1.4–7.7)
Neutrophils Relative %: 68.5 % (ref 43.0–77.0)
Platelets: 193 K/uL (ref 150.0–400.0)
RBC: 3.7 Mil/uL — ABNORMAL LOW (ref 3.87–5.11)
RDW: 15.6 % — ABNORMAL HIGH (ref 11.5–15.5)
WBC: 6.6 K/uL (ref 4.0–10.5)

## 2024-06-10 LAB — HEMOGLOBIN A1C: Hgb A1c MFr Bld: 5.8 % (ref 4.6–6.5)

## 2024-06-10 LAB — TSH: TSH: 3.64 u[IU]/mL (ref 0.35–5.50)

## 2024-06-10 MED ORDER — NEOMYCIN-POLYMYXIN-HC 3.5-10000-1 OT SOLN
3.0000 [drp] | Freq: Three times a day (TID) | OTIC | 0 refills | Status: DC
Start: 2024-06-10 — End: 2024-08-02

## 2024-06-10 NOTE — Patient Instructions (Addendum)
 Tetanus is due Shingrix is the new shingles shot, 2 shots over 2-6 months, confirm coverage with insurance and document, then can return here for shots with nurse appt or at pharmacy  RSV, Respiratory Syncitial Virus Vaccine, Arexvy at pharmacy COVID and flu  vaccines in fall Prevnar 20 booster

## 2024-06-11 ENCOUNTER — Ambulatory Visit (INDEPENDENT_AMBULATORY_CARE_PROVIDER_SITE_OTHER)

## 2024-06-11 ENCOUNTER — Ambulatory Visit: Payer: Self-pay | Admitting: Family Medicine

## 2024-06-11 ENCOUNTER — Encounter: Payer: Self-pay | Admitting: Family Medicine

## 2024-06-11 VITALS — Ht 63.0 in | Wt 238.0 lb

## 2024-06-11 DIAGNOSIS — Z0000 Encounter for general adult medical examination without abnormal findings: Secondary | ICD-10-CM | POA: Diagnosis not present

## 2024-06-11 DIAGNOSIS — Z1231 Encounter for screening mammogram for malignant neoplasm of breast: Secondary | ICD-10-CM | POA: Diagnosis not present

## 2024-06-11 DIAGNOSIS — Z780 Asymptomatic menopausal state: Secondary | ICD-10-CM

## 2024-06-11 NOTE — Progress Notes (Signed)
 Subjective:    Patient ID: [name], female    DOB: 15-Sep-1946, 78 y.o.   MRN: [id number]  Chief Complaint  Patient presents with   Medical Management of Chronic Issues    Patinet presents today for a follow-up.   Quality Metric Gaps    AWV, colonoscopy, foot exam, urine microalbumin, TDAP    HPI Discussed the use of AI scribe software for clinical note transcription with the patient, who gave verbal consent to proceed.  History of Present Illness [name] is a 78 year old female who presents with concerns about a growing arm lump and ear discomfort.  She has a lump on her arm that has been present since an injury over ten years ago when she fell and required stitches. The lump, described as a 'knot,' seems to be getting larger and occasionally feels uncomfortable. It is approximately two centimeters long and one centimeter wide. She is uncertain if it is actually increasing in size but feels it is more noticeable at times.  She experiences significant itchiness in her ear, with a sensation as if the ear canal is getting smaller, which she attributes to possible swelling. There is no pain, but the itchiness is persistent.  She has a history of hemorrhoids that were exacerbated by increased physical activity during a family gathering. She uses Preparation H and Desitin with zinc to manage symptoms, which she finds effective.  She has a history of rheumatoid arthritis and reports developing pain in the last digit of all fingers, which she suspects may be osteoarthritis. The pain is worse in the morning or after periods of inactivity but tends to ease with movement. She is currently trying to maintain a routine of exercises recommended by a physical therapist, which she finds beneficial. She has completed a course of physical therapy and is attempting to incorporate the exercises into her daily routine.  No chest pain, new breathing troubles, or blood in stool. Reports daily  stiffness and pain in fingers, particularly in the morning or after inactivity.    Past Medical History:  Diagnosis Date   Allergy    Anxiety    Arthritis    Atrial fibrillation (HCC) 2008   Blood transfusion without reported diagnosis    CAD (coronary artery disease)    Crohn's colitis (HCC)    she reports ulcerative colitis beginning in her 48's, biopsies 2008 suggest Crohn's not UC   Depression    Diverticulosis    GERD (gastroesophageal reflux disease)    Hyperlipidemia    Hypertension    IBS (irritable bowel syndrome)    Keloid of skin    Obesity    OSA (obstructive sleep apnea)    Pancreatitis    Paroxysmal atrial fibrillation (HCC)    Pseudoaneurysm of right femoral artery (HCC)    Renal oncocytoma    s/p right nephrectomy   Rheumatoid arthritis (HCC) 05/29/2017   Sleep apnea    UC (ulcerative colitis) (HCC)     Past Surgical History:  Procedure Laterality Date   ANGIOPLASTY     BREAST BIOPSY  10/24/1994   benign lesion    Cataract surgery Left 11/13/2014   CHOLECYSTECTOMY  10/24/2006   COLONOSCOPY W/ BIOPSIES     CORONARY ANGIOPLASTY     ESOPHAGOGASTRODUODENOSCOPY     EYE SURGERY     NEPHRECTOMY  10/24/2006   right secondary to oncocytoma    RIGHT FEMORAL  PSEUDOANEURYSM & RIGHT GROIN HEMATOMA EVACUATION  02/22/2007   RIGHT  RENAL ARTERY REPAIR  10/24/1974   TUBAL LIGATION      Family History  Problem Relation Age of Onset   Hypertension Mother    Coronary artery disease Mother    Stroke Mother    Clotting disorder Mother    Heart disease Mother    Allergies Mother    Colitis Father    Heart disease Father    Clotting disorder Sister    Stroke Brother    Clotting disorder Brother    Cancer Daughter    Asthma Daughter    Asthma Maternal Uncle    Colon cancer Paternal Aunt    Cancer Daughter    Colon polyps Neg Hx    Kidney disease Neg Hx    Stomach cancer Neg Hx    Esophageal cancer Neg Hx     Social History   Socioeconomic History    Marital status: Married    Spouse name: Not on file   Number of children: 3   Years of education: Not on file   Highest education level: 12th grade  Occupational History   Occupation: Retired    Associate Professor: RETIRED    Comment: retired  Tobacco Use   Smoking status: Never   Smokeless tobacco: Never   Tobacco comments:    Never smoked 03/06/24  Vaping Use   Vaping status: Never Used  Substance and Sexual Activity   Alcohol use: No   Drug use: No   Sexual activity: Not Currently    Birth control/protection: Condom, Other-see comments    Comment: Tbal ligation  Other Topics Concern   Not on file  Social History Narrative   Married since 1965 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, 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 - adults, 2 grandchildrenNo pets -Daughter has breast cancer   Social Drivers of Corporate investment banker Strain: Low Risk  (06/08/2024)   Overall Financial Resource Strain (CARDIA)    Difficulty of Paying Living Expenses: Not hard at all  Food Insecurity: No Food Insecurity (06/08/2024)   Hunger Vital Sign    Worried About Running Out of Food in the Last Year: Never true    Ran Out of Food in the Last Year: Never true  Transportation Needs: No Transportation Needs (06/08/2024)   PRAPARE - Administrator, Civil Service (Medical): No    Lack of Transportation (Non-Medical): No  Physical Activity: Patient Declined (06/08/2024)   Exercise Vital Sign    Days of Exercise per Week: Patient declined    Minutes of Exercise per Session: Patient declined  Stress: No Stress Concern Present (06/08/2024)   [employer] of Occupational Health - Occupational Stress Questionnaire    Feeling of Stress: Not at all  Social Connections: Unknown (06/08/2024)   Social Connection and Isolation Panel    Frequency of Communication with Friends and Family: More than three times a week    Frequency of Social Gatherings with Friends and Family: Three times a week    Attends  Religious Services: Patient declined    Active Member of Clubs or Organizations: Patient declined    Attends Banker Meetings: Patient declined    Marital Status: Married  Catering manager Violence: Not At Risk (06/11/2024)   Humiliation, Afraid, Rape, and Kick questionnaire    Fear of Current or Ex-Partner: No    Emotionally Abused: No    Physically Abused: No    Sexually Abused: No    Outpatient Medications Prior to Visit  Medication Sig Dispense Refill  amLODipine  (NORVASC ) 10 MG tablet TAKE 1 TABLET BY MOUTH EVERY DAY 90 tablet 3   clobetasol ointment (TEMOVATE) 0.05 % Apply 1 Application topically 2 (two) times daily.     dextrose 5 % SOLN 50 mL with methotrexate 25 MG/ML (PF) SOLN 40 mg/m2 Inject 40 mg/m2 into the vein once a week. Every wednesday     dofetilide  (TIKOSYN ) 125 MCG capsule TAKE 3 CAPSULES BY MOUTH TWICE A DAY 540 capsule 3   doxazosin  (CARDURA ) 2 MG tablet TAKE 1 TABLET BY MOUTH EVERY DAY 90 tablet 3   escitalopram  (LEXAPRO ) 10 MG tablet TAKE 1 TABLET BY MOUTH EVERY DAY 90 tablet 1   esomeprazole  (NEXIUM ) 40 MG capsule Take 1 capsule (40 mg total) by mouth daily. Take in the morning before breakfast. 30 capsule 11   folic acid (FOLVITE) 1 MG tablet Take 2 mg by mouth daily.     hydrocortisone  (ANUSOL -HC) 25 MG suppository Place 1 suppository (25 mg total) rectally 2 (two) times daily as needed for hemorrhoids or anal itching. 12 suppository 1   inFLIXimab  10 mg/kg in sodium chloride  0.9 % Inject 10 mg/kg into the vein every 6 (six) weeks.     loperamide (IMODIUM A-D) 2 MG tablet Take 1 mg by mouth as needed. Dihrrhea     metoprolol  tartrate (LOPRESSOR ) 25 MG tablet Take 0.5 tablets (12.5 mg total) by mouth 2 (two) times daily. 90 tablet 3   Multiple Vitamin (MULTIVITAMIN) capsule Take 1 capsule by mouth daily. 1 daily     nitroGLYCERIN  (NITROSTAT ) 0.4 MG SL tablet Place 1 tablet (0.4 mg total) under the tongue every 5 (five) minutes as needed for chest  pain. Do not exceed 3 tabs in 15 minutes 75 tablet 3   Probiotic Product (PHILLIPS COLON HEALTH) CAPS Take 1 capsule by mouth daily. 1 per day     TUBERCULIN SYR 1CC/27GX1/2 27G X 1/2 1 ML MISC      valsartan  (DIOVAN ) 160 MG tablet Take 1 tablet (160 mg total) by mouth daily. 90 tablet 3   warfarin (COUMADIN ) 2.5 MG tablet TAKE 1/2 TABLET TO 1 TABLET BY MOUTH DAILY AS DIRECTED BY COUMADIN  CLINIC 75 tablet 1   No facility-administered medications prior to visit.    Allergies  Allergen Reactions   Hydralazine  Diarrhea and Nausea Only    Anorexia, upset stomach, burning pain in abdomin   Cefdinir      Pt cannot take; may interfere with AFib.   Codeine     Heart beats fast    Fosamax [Alendronate Sodium] Other (See Comments)    GI upset   Gluten Meal    Guaifenesin Er Nausea And Vomiting and Other (See Comments)    Headaches; can tolerate liquid.   Latex Other (See Comments)    Breathing problems   Percocet [Oxycodone -Acetaminophen ]     Itching & swelling in jaw Pt said she can take tylenol    Psyllium Other (See Comments)    Review of Systems  Constitutional:  Positive for malaise/fatigue. Negative for fever.  HENT:  Negative for congestion.   Eyes:  Negative for blurred vision.  Respiratory:  Negative for shortness of breath.   Cardiovascular:  Negative for chest pain, palpitations and leg swelling.  Gastrointestinal:  Negative for abdominal pain, blood in stool and nausea.  Genitourinary:  Negative for dysuria and frequency.  Musculoskeletal:  Positive for joint pain. Negative for falls.  Skin:  Negative for rash.  Neurological:  Negative for dizziness, loss of consciousness and headaches.  Endo/Heme/Allergies:  Negative for environmental allergies.  Psychiatric/Behavioral:  Negative for depression. The patient is not nervous/anxious.        Objective:    Physical Exam Constitutional:      General: She is not in acute distress.    Appearance: Normal appearance. She  is well-developed. She is not toxic-appearing.  HENT:     Head: Normocephalic and atraumatic.     Right Ear: External ear normal.     Left Ear: External ear normal.     Nose: Nose normal.  Eyes:     General:        Right eye: No discharge.        Left eye: No discharge.     Conjunctiva/sclera: Conjunctivae normal.  Neck:     Thyroid : No thyromegaly.  Cardiovascular:     Rate and Rhythm: Normal rate and regular rhythm.     Heart sounds: Normal heart sounds. No murmur heard. Pulmonary:     Effort: Pulmonary effort is normal. No respiratory distress.     Breath sounds: Normal breath sounds.  Abdominal:     General: Bowel sounds are normal.     Palpations: Abdomen is soft.     Tenderness: There is no abdominal tenderness. There is no guarding.  Musculoskeletal:        General: Normal range of motion.     Cervical back: Neck supple.  Lymphadenopathy:     Cervical: No cervical adenopathy.  Skin:    General: Skin is warm and dry.     Comments: 1x2 cm raised, mobile firm lesion at end of scar on right forearm. nontender  Neurological:     Mental Status: She is alert and oriented to person, place, and time.  Psychiatric:        Mood and Affect: Mood normal.        Behavior: Behavior normal.        Thought Content: Thought content normal.        Judgment: Judgment normal.     BP 116/82   Pulse (!) 52   Resp 16   Ht 5' 3 (1.6 m)   Wt 238 lb 3.2 oz (108 kg)   SpO2 98%   BMI 42.20 kg/m  Wt Readings from Last 3 Encounters:  06/11/24 238 lb (108 kg)  06/10/24 238 lb 3.2 oz (108 kg)  04/17/24 241 lb (109.3 kg)    Diabetic Foot Exam - Simple   No data filed    Lab Results  Component Value Date   WBC 6.6 06/10/2024   HGB 11.9 (L) 06/10/2024   HCT 36.5 06/10/2024   PLT 193.0 06/10/2024   GLUCOSE 150 (H) 06/10/2024   CHOL 159 06/10/2024   TRIG 96.0 06/10/2024   HDL 47.30 06/10/2024   LDLCALC 92 06/10/2024   ALT 13 06/10/2024   AST 18 06/10/2024   NA 140 06/10/2024    K 3.9 06/10/2024   CL 104 06/10/2024   CREATININE 1.09 06/10/2024   BUN 17 06/10/2024   CO2 25 06/10/2024   TSH 3.64 06/10/2024   INR 2.3 06/04/2024   HGBA1C 5.8 06/10/2024    Lab Results  Component Value Date   TSH 3.64 06/10/2024   Lab Results  Component Value Date   WBC 6.6 06/10/2024   HGB 11.9 (L) 06/10/2024   HCT 36.5 06/10/2024   MCV 98.5 06/10/2024   PLT 193.0 06/10/2024   Lab Results  Component Value Date   NA 140 06/10/2024   K  3.9 06/10/2024   CO2 25 06/10/2024   GLUCOSE 150 (H) 06/10/2024   BUN 17 06/10/2024   CREATININE 1.09 06/10/2024   BILITOT 0.5 06/10/2024   ALKPHOS 79 06/10/2024   AST 18 06/10/2024   ALT 13 06/10/2024   PROT 6.5 06/10/2024   ALBUMIN 3.4 (L) 06/10/2024   CALCIUM  8.6 06/10/2024   ANIONGAP 7 09/13/2023   EGFR 49 (L) 10/06/2023   GFR 48.56 (L) 06/10/2024   Lab Results  Component Value Date   CHOL 159 06/10/2024   Lab Results  Component Value Date   HDL 47.30 06/10/2024   Lab Results  Component Value Date   LDLCALC 92 06/10/2024   Lab Results  Component Value Date   TRIG 96.0 06/10/2024   Lab Results  Component Value Date   CHOLHDL 3 06/10/2024   Lab Results  Component Value Date   HGBA1C 5.8 06/10/2024       Assessment & Plan:  Shoulder pain, unspecified chronicity, unspecified laterality Assessment & Plan: Encouraged moist heat and gentle stretching as tolerated. May try NSAIDs and prescription meds as directed and report if symptoms worsen or seek immediate care    Paroxysmal atrial fibrillation [facility]) Assessment & Plan: No recent exacerbation  Orders: -     TSH  Secondary hypertension Assessment & Plan: Well controlled, no changes to meds. Encouraged heart healthy diet such as the DASH diet and exercise as tolerated.     Anemia, unspecified type Assessment & Plan: Increase leafy greens, consider increased lean red meat and using cast iron cookware. Continue to monitor, report any concerns    Orders: -     CBC with Differential/Platelet  Hyperglycemia Assessment & Plan: hgba1c acceptable, minimize simple carbs. Increase exercise as tolerated.   Orders: -     Comprehensive metabolic panel with GFR -     Hemoglobin A1c  Pure hypercholesterolemia Assessment & Plan: Encourage heart healthy diet such as MIND or DASH diet, increase exercise, avoid trans fats, simple carbohydrates and processed foods, consider a krill or fish or flaxseed oil cap daily.   Orders: -     Lipid panel  Other orders -     Neomycin -Polymyxin-HC; Place 3 drops into the left ear 3 (three) times daily.  Dispense: 10 mL; Refill: 0    Assessment and Plan Assessment & Plan Right arm subcutaneous mass (likely lipoma or scar) The mass is likely a lipoma or scar tissue, approximately 2 cm long and 1 cm wide, soft, mobile, and not causing discomfort. - Monitor for changes in size or symptoms. - Refer to a surgeon if changes occur or symptoms develop.  Itchy right ear canal (possible mild otitis externa) The right ear canal is itchy, possibly due to mild otitis externa. No visible psoriasis or significant inflammation. Pseudomonas infection is a potential cause. - Prescribe ear drops for use if itching or swelling occurs. - Advise to contact provider for alternative prescription if ear drops are not covered by insurance.  Rheumatoid arthritis and osteoarthritis of fingers Rheumatoid arthritis is well-managed. Osteoarthritis suspected in distal interphalangeal joints, causing stiffness and pain, especially in the morning or after inactivity. - Continue current management for rheumatoid arthritis. - Monitor osteoarthritis symptoms and consider interventions if symptoms worsen.  Paroxysmal atrial fibrillation Paroxysmal atrial fibrillation is well-managed with no recent episodes of palpitations or other symptoms. - Continue current management and medications. - Attend scheduled appointment with the  AFib clinic.  Recording duration: 16 minutes     Harlene Horton, MD

## 2024-06-11 NOTE — Progress Notes (Signed)
 Subjective:   [name] is a 78 y.o. who presents for a Medicare Wellness preventive visit.  As a reminder, Annual Wellness Visits don't include a physical exam, and some assessments may be limited, especially if this visit is performed virtually. We may recommend an in-person follow-up visit with your provider if needed.  Visit Complete: Virtual I connected with  [name] on 06/11/24 by a audio enabled telemedicine application and verified that I am speaking with the correct person using two identifiers.  Patient Location: Home  Provider Location: Home Office  I discussed the limitations of evaluation and management by telemedicine. The patient expressed understanding and agreed to proceed.  Vital Signs: Because this visit was a virtual/telehealth visit, some criteria may be missing or patient reported. Any vitals not documented were not able to be obtained and vitals that have been documented are patient reported.  VideoDeclined- This patient declined Librarian, academic. Therefore the visit was completed with audio only.  Persons Participating in Visit: Patient.  AWV Questionnaire: Yes: Patient Medicare AWV questionnaire was completed by the patient on 06/08/24; I have confirmed that all information answered by patient is correct and no changes since this date.  Cardiac Risk Factors include: advanced age (>62men, >10 women);dyslipidemia;hypertension     Objective:    Today's Vitals   06/11/24 1322  Weight: 238 lb (108 kg)  Height: 5' 3 (1.6 m)   Body mass index is 42.16 kg/m.     06/11/2024    1:25 PM 09/11/2023    3:25 PM 09/10/2023    6:55 PM 09/05/2023    9:55 PM 06/07/2023    3:44 PM 05/23/2023   10:13 AM 01/26/2023    9:05 PM  Advanced Directives  Does Patient Have a Medical Advance Directive? No  No No No No No  Would patient like information on creating a medical advance directive? Yes (MAU/Ambulatory/Procedural Areas -  Information given) No - Patient declined   No - Patient declined      Current Medications (verified) Outpatient Encounter Medications as of 06/11/2024  Medication Sig   amLODipine  (NORVASC ) 10 MG tablet TAKE 1 TABLET BY MOUTH EVERY DAY   clobetasol ointment (TEMOVATE) 0.05 % Apply 1 Application topically 2 (two) times daily.   dextrose 5 % SOLN 50 mL with methotrexate 25 MG/ML (PF) SOLN 40 mg/m2 Inject 40 mg/m2 into the vein once a week. Every wednesday   dofetilide  (TIKOSYN ) 125 MCG capsule TAKE 3 CAPSULES BY MOUTH TWICE A DAY   doxazosin  (CARDURA ) 2 MG tablet TAKE 1 TABLET BY MOUTH EVERY DAY   escitalopram  (LEXAPRO ) 10 MG tablet TAKE 1 TABLET BY MOUTH EVERY DAY   esomeprazole  (NEXIUM ) 40 MG capsule Take 1 capsule (40 mg total) by mouth daily. Take in the morning before breakfast.   folic acid (FOLVITE) 1 MG tablet Take 2 mg by mouth daily.   hydrocortisone  (ANUSOL -HC) 25 MG suppository Place 1 suppository (25 mg total) rectally 2 (two) times daily as needed for hemorrhoids or anal itching.   inFLIXimab  10 mg/kg in sodium chloride  0.9 % Inject 10 mg/kg into the vein every 6 (six) weeks.   loperamide (IMODIUM A-D) 2 MG tablet Take 1 mg by mouth as needed. Dihrrhea   metoprolol  tartrate (LOPRESSOR ) 25 MG tablet Take 0.5 tablets (12.5 mg total) by mouth 2 (two) times daily.   Multiple Vitamin (MULTIVITAMIN) capsule Take 1 capsule by mouth daily. 1 daily   neomycin -polymyxin-hydrocortisone  (CORTISPORIN) OTIC solution Place 3 drops  into the left ear 3 (three) times daily.   nitroGLYCERIN  (NITROSTAT ) 0.4 MG SL tablet Place 1 tablet (0.4 mg total) under the tongue every 5 (five) minutes as needed for chest pain. Do not exceed 3 tabs in 15 minutes   Probiotic Product (PHILLIPS COLON HEALTH) CAPS Take 1 capsule by mouth daily. 1 per day   TUBERCULIN SYR 1CC/27GX1/2 27G X 1/2 1 ML MISC    valsartan  (DIOVAN ) 160 MG tablet Take 1 tablet (160 mg total) by mouth daily.   warfarin (COUMADIN ) 2.5 MG tablet  TAKE 1/2 TABLET TO 1 TABLET BY MOUTH DAILY AS DIRECTED BY COUMADIN  CLINIC   No facility-administered encounter medications on file as of 06/11/2024.    Allergies (verified) Hydralazine , Cefdinir , Codeine, Fosamax [alendronate sodium], Gluten meal, Guaifenesin er, Latex, Percocet [oxycodone -acetaminophen ], and Psyllium   History: Past Medical History:  Diagnosis Date   Allergy    Anxiety    Arthritis    Atrial fibrillation (HCC) 2008   Blood transfusion without reported diagnosis    CAD (coronary artery disease)    Crohn's colitis (HCC)    she reports ulcerative colitis beginning in her 8's, biopsies 2008 suggest Crohn's not UC   Depression    Diverticulosis    GERD (gastroesophageal reflux disease)    Hyperlipidemia    Hypertension    IBS (irritable bowel syndrome)    Keloid of skin    Obesity    OSA (obstructive sleep apnea)    Pancreatitis    Paroxysmal atrial fibrillation (HCC)    Pseudoaneurysm of right femoral artery (HCC)    Renal oncocytoma    s/p right nephrectomy   Rheumatoid arthritis (HCC) 05/29/2017   Sleep apnea    UC (ulcerative colitis) (HCC)    Past Surgical History:  Procedure Laterality Date   ANGIOPLASTY     BREAST BIOPSY  10/24/1994   benign lesion    Cataract surgery Left 11/13/2014   CHOLECYSTECTOMY  10/24/2006   COLONOSCOPY W/ BIOPSIES     CORONARY ANGIOPLASTY     ESOPHAGOGASTRODUODENOSCOPY     EYE SURGERY     NEPHRECTOMY  10/24/2006   right secondary to oncocytoma    RIGHT FEMORAL  PSEUDOANEURYSM & RIGHT GROIN HEMATOMA EVACUATION  02/22/2007   RIGHT RENAL ARTERY REPAIR  10/24/1974   TUBAL LIGATION     Family History  Problem Relation Age of Onset   Hypertension Mother    Coronary artery disease Mother    Stroke Mother    Clotting disorder Mother    Heart disease Mother    Allergies Mother    Colitis Father    Heart disease Father    Clotting disorder Sister    Stroke Brother    Clotting disorder Brother    Cancer Daughter     Asthma Daughter    Asthma Maternal Uncle    Colon cancer Paternal Aunt    Cancer Daughter    Colon polyps Neg Hx    Kidney disease Neg Hx    Stomach cancer Neg Hx    Esophageal cancer Neg Hx    Social History   Socioeconomic History   Marital status: Married    Spouse name: Not on file   Number of children: 3   Years of education: Not on file   Highest education level: 12th grade  Occupational History   Occupation: Retired    Associate Professor: RETIRED    Comment: retired  Tobacco Use   Smoking status: Never   Smokeless tobacco: Never  Tobacco comments:    Never smoked 03/06/24  Vaping Use   Vaping status: Never Used  Substance and Sexual Activity   Alcohol use: No   Drug use: No   Sexual activity: Not Currently    Birth control/protection: Condom, Other-see comments    Comment: Tbal ligation  Other Topics Concern   Not on file  Social History Narrative   Married since 1965 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, 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 - adults, 2 grandchildrenNo pets -Daughter has breast cancer   Social Drivers of Corporate investment banker Strain: Low Risk  (06/08/2024)   Overall Financial Resource Strain (CARDIA)    Difficulty of Paying Living Expenses: Not hard at all  Food Insecurity: No Food Insecurity (06/08/2024)   Hunger Vital Sign    Worried About Running Out of Food in the Last Year: Never true    Ran Out of Food in the Last Year: Never true  Transportation Needs: No Transportation Needs (06/08/2024)   PRAPARE - Administrator, Civil Service (Medical): No    Lack of Transportation (Non-Medical): No  Physical Activity: Patient Declined (06/08/2024)   Exercise Vital Sign    Days of Exercise per Week: Patient declined    Minutes of Exercise per Session: Patient declined  Stress: No Stress Concern Present (06/08/2024)   [employer] of Occupational Health - Occupational Stress Questionnaire    Feeling of Stress: Not at all  Social  Connections: Unknown (06/08/2024)   Social Connection and Isolation Panel    Frequency of Communication with Friends and Family: More than three times a week    Frequency of Social Gatherings with Friends and Family: Three times a week    Attends Religious Services: Patient declined    Active Member of Clubs or Organizations: Patient declined    Attends Banker Meetings: Patient declined    Marital Status: Married    Tobacco Counseling Counseling given: Not Answered Tobacco comments: Never smoked 03/06/24    Clinical Intake:  Pre-visit preparation completed: Yes  Pain : No/denies pain  Diabetes: No  Lab Results  Component Value Date   HGBA1C 5.8 06/10/2024   HGBA1C 5.8 01/18/2024   HGBA1C 5.6 04/25/2023     How often do you need to have someone help you when you read instructions, pamphlets, or other written materials from your doctor or pharmacy?: 1 - Never  Interpreter Needed?: No  Information entered by :: Charmaine Bloodgood LPN   Activities of Daily Living     06/11/2024    9:20 AM 09/11/2023    3:26 PM  In your present state of health, do you have any difficulty performing the following activities:  Hearing? 1   Vision? 0   Difficulty concentrating or making decisions? 0   Walking or climbing stairs? 0   Dressing or bathing? 0   Doing errands, shopping? 1 0  Preparing Food and eating ? N   Using the Toilet? N   In the past six months, have you accidently leaked urine? Y   Do you have problems with loss of bowel control? N   Managing your Medications? N   Managing your Finances? N   Housekeeping or managing your Housekeeping? N     Patient Care Team: Domenica Harlene LABOR, MD as PCP - General (Family Medicine) Pietro Redell RAMAN, MD as PCP - Cardiology (Cardiology) Inocencio Soyla Lunger, MD as PCP - Electrophysiology (Cardiology) Avram Lupita BRAVO, MD as Consulting Physician (Gastroenterology) Jude Donning  LULLA, MD as Consulting Physician (Pulmonary  Disease) Mai Lynwood FALCON, MD as Consulting Physician (Rheumatology) Haverstock, Tawni CROME, MD as Referring Physician (Dermatology) Loel Awanda BIRCH, DPM as Consulting Physician (Podiatry)  I have updated your Care Teams any recent Medical Services you may have received from other providers in the past year.     Assessment:   This is a routine wellness examination for [name].  Hearing/Vision screen Hearing Screening - Comments:: Some hearing loss; followed by audiology (adaptive devices in place)  Vision Screening - Comments:: Wears rx glasses - up to date with routine eye exams    Goals Addressed             This Visit's Progress    Patient Stated   On track    Continue to drink plenty of water       Depression Screen     06/11/2024    1:24 PM 06/07/2023    3:46 PM 04/25/2023   10:47 AM 08/15/2022    1:14 PM 05/27/2022    1:44 PM 05/24/2021    1:25 PM 12/06/2019    1:15 PM  PHQ 2/9 Scores  PHQ - 2 Score 0 0 0 0 0 0 0  PHQ- 9 Score   0 0       Fall Risk     06/11/2024    9:20 AM 09/25/2023   10:31 AM 06/06/2023   10:15 PM 05/29/2023    3:22 PM 04/25/2023   10:47 AM  Fall Risk   Falls in the past year? 0 0 0 0 0  Number falls in past yr: 0  0  0  Injury with Fall? 0  0  0  Risk for fall due to : No Fall Risks  No Fall Risks    Follow up Falls prevention discussed;Education provided;Falls evaluation completed  Falls evaluation completed      MEDICARE RISK AT HOME:  Medicare Risk at Home Any stairs in or around the home?: (Patient-Rptd) Yes If so, are there any without handrails?: (Patient-Rptd) Yes Home free of loose throw rugs in walkways, pet beds, electrical cords, etc?: (Patient-Rptd) Yes Adequate lighting in your home to reduce risk of falls?: (Patient-Rptd) Yes Life alert?: (Patient-Rptd) No Use of a cane, walker or w/c?: (Patient-Rptd) Yes Grab bars in the bathroom?: (Patient-Rptd) Yes Shower chair or bench in shower?: (Patient-Rptd) Yes Elevated toilet seat  or a handicapped toilet?: (Patient-Rptd) Yes  TIMED UP AND GO:  Was the test performed?  No  Cognitive Function: Declined/Normal: No cognitive concerns noted by patient or family. Patient alert, oriented, able to answer questions appropriately and recall recent events. No signs of memory loss or confusion.    12/03/2018    4:13 PM 11/30/2017    9:21 AM 12/14/2015   10:51 AM  MMSE - Mini Mental State Exam  Orientation to time 5 5  5    Orientation to Place 5 5  5    Registration 3 3  3    Attention/ Calculation 5 5  5    Recall 3 3  3    Language- name 2 objects 2 2  2    Language- repeat 1 1 1   Language- follow 3 step command 3 3  3    Language- read & follow direction 1 1  1    Write a sentence 1 1  1    Copy design 1 1  1    Total score 30 30  30       Data saved with a previous flowsheet  row definition        06/07/2023    3:48 PM 05/27/2022    1:52 PM  6CIT Screen  What Year? 0 points 0 points  What month? 0 points 0 points  What time? 0 points 0 points  Count back from 20 0 points 0 points  Months in reverse 0 points 0 points  Repeat phrase 0 points 0 points  Total Score 0 points 0 points    Immunizations Immunization History  Administered Date(s) Administered   Fluad Quad(high Dose 65+) 09/12/2019, 08/03/2021, 08/15/2022   Fluad Trivalent(High Dose 65+) 09/07/2023   Influenza Split 07/23/2012   Influenza Whole 09/06/2007, 08/11/2008, 08/24/2009, 08/03/2010   Influenza, High Dose Seasonal PF 08/02/2013, 06/29/2016, 07/06/2017, 07/31/2018   Influenza,inj,Quad PF,6+ Mos 06/27/2014, 06/26/2015   Moderna Sars-Covid-2 Vaccination 12/25/2019, 01/22/2020   Pneumococcal Conjugate-13 08/28/2014   Pneumococcal Polysaccharide-23 09/13/2010   Td 05/12/2009   Zoster, Live 02/11/2008    Screening Tests Health Maintenance  Topic Date Due   DTaP/Tdap/Td (2 - Tdap) 05/13/2019   DEXA SCAN  07/27/2023   INFLUENZA VACCINE  05/24/2024   COVID-19 Vaccine (3 - Moderna risk series)  10/23/2024 (Originally 02/19/2020)   Zoster Vaccines- Shingrix (1 of 2) 10/23/2024 (Originally 08/21/1965)   Medicare Annual Wellness (AWV)  06/11/2025   Pneumococcal Vaccine: 50+ Years  Completed   Hepatitis C Screening  Completed   HPV VACCINES  Aged Out   Meningococcal B Vaccine  Aged Out   Pneumococcal Vaccine  Discontinued   Colonoscopy  Discontinued    Health Maintenance  Health Maintenance Due  Topic Date Due   DTaP/Tdap/Td (2 - Tdap) 05/13/2019   DEXA SCAN  07/27/2023   INFLUENZA VACCINE  05/24/2024   Health Maintenance Items Addressed: Mammogram ordered, DEXA ordered  Additional Screening:  Vision Screening: Recommended annual ophthalmology exams for early detection of glaucoma and other disorders of the eye. Would you like a referral to an eye doctor? No    Dental Screening: Recommended annual dental exams for proper oral hygiene  Community Resource Referral / Chronic Care Management: CRR required this visit?  No   CCM required this visit?  No   Plan:    I have personally reviewed and noted the following in the patient's chart:   Medical and social history Use of alcohol, tobacco or illicit drugs  Current medications and supplements including opioid prescriptions. Patient is not currently taking opioid prescriptions. Functional ability and status Nutritional status Physical activity Advanced directives List of other physicians Hospitalizations, surgeries, and ER visits in previous 12 months Vitals Screenings to include cognitive, depression, and falls Referrals and appointments  In addition, I have reviewed and discussed with patient certain preventive protocols, quality metrics, and best practice recommendations. A written personalized care plan for preventive services as well as general preventive health recommendations were provided to patient.   Lavelle Pfeiffer [place], [place]   1/80/7974   After Visit Summary: (MyChart) Due to this being a  telephonic visit, the after visit summary with patients personalized plan was offered to patient via MyChart   Notes: Nothing significant to report at this time.

## 2024-06-11 NOTE — Patient Instructions (Addendum)
 [name] , Thank you for taking time out of your busy schedule to complete your Annual Wellness Visit with me. I enjoyed our conversation and look forward to speaking with you again next year. I, as well as your care team,  appreciate your ongoing commitment to your health goals. Please review the following plan we discussed and let me know if I can assist you in the future. Your Game plan/ To Do List    Referrals: You have an order for:  []   2D Mammogram  [x]   3D Mammogram  [x]   Bone Density     Please call for appointment:  [facility] [address] [place], [place] [address] [phone]  Make sure to wear two-piece clothing.  No lotions, powders, or deodorants the day of the appointment. Make sure to bring picture ID and insurance card.  Bring list of medications you are currently taking including any supplements.   Schedule your [facility] screening mammogram through MyChart!   Log into your MyChart account.  Go to 'Visit' (or 'Appointments' if on mobile App) --> Schedule an Appointment  Under 'Select a Reason for Visit' choose the Mammogram Screening option.  Complete the pre-visit questions and select the time and place that best fits your schedule.    Follow up Visits: We will see or speak with you next year for your Next Medicare AWV with our clinical staff Have you seen your provider in the last 6 months (3 months if uncontrolled diabetes)? Yes  Clinician Recommendations:  Aim for 30 minutes of exercise or brisk walking, 6-8 glasses of water, and 5 servings of fruits and vegetables each day.       This is a list of the screenings recommended for you:  Health Maintenance  Topic Date Due   DTaP/Tdap/Td vaccine (2 - Tdap) 05/13/2019   DEXA scan (bone density measurement)  07/27/2023   Flu Shot  05/24/2024   COVID-19 Vaccine (3 - Moderna risk series) 10/23/2024*   Zoster (Shingles) Vaccine (1 of 2) 10/23/2024*    Medicare Annual Wellness Visit  06/11/2025   Pneumococcal Vaccine for age over 12  Completed   Hepatitis C Screening  Completed   HPV Vaccine  Aged Out   Meningitis B Vaccine  Aged Out   Pneumococcal Vaccine  Discontinued   Colon Cancer Screening  Discontinued  *Topic was postponed. The date shown is not the original due date.    Advanced directives: (ACP Link)Information on Advanced Care Planning can be found at [place]  Secretary of [facility] Advance Health Care Directives Advance Health Care Directives. [email or web address]   Advance Care Planning is important because it:  [x]  Makes sure you receive the medical care that is consistent with your values, goals, and preferences  [x]  It provides guidance to your family and loved ones and reduces their decisional burden about whether or not they are making the right decisions based on your wishes.  Follow the link provided in your after visit summary or read over the paperwork we have mailed to you to help you started getting your Advance Directives in place. If you need assistance in completing these, please reach out to us  so that we can help you!  See attachments for Preventive Care and Fall Prevention Tips.

## 2024-06-12 ENCOUNTER — Encounter: Payer: Self-pay | Admitting: Family Medicine

## 2024-06-12 NOTE — Telephone Encounter (Signed)
 Notified pt and sent over a good rx coupon to pharmacy so they can run through for 32.8

## 2024-06-13 ENCOUNTER — Ambulatory Visit (HOSPITAL_COMMUNITY)
Admission: RE | Admit: 2024-06-13 | Discharge: 2024-06-13 | Disposition: A | Discharge status: Home / Self Care | Source: Ambulatory Visit | Attending: Physician Assistant | Admitting: Physician Assistant

## 2024-06-13 VITALS — BP 152/70 | HR 55 | Ht 63.0 in | Wt 240.2 lb

## 2024-06-13 DIAGNOSIS — I4819 Other persistent atrial fibrillation: Secondary | ICD-10-CM | POA: Diagnosis not present

## 2024-06-13 DIAGNOSIS — Z79899 Other long term (current) drug therapy: Secondary | ICD-10-CM | POA: Diagnosis not present

## 2024-06-13 DIAGNOSIS — D6869 Other thrombophilia: Secondary | ICD-10-CM | POA: Insufficient documentation

## 2024-06-13 DIAGNOSIS — Z5181 Encounter for therapeutic drug level monitoring: Secondary | ICD-10-CM | POA: Insufficient documentation

## 2024-06-13 DIAGNOSIS — I4891 Unspecified atrial fibrillation: Secondary | ICD-10-CM | POA: Diagnosis not present

## 2024-06-13 NOTE — Progress Notes (Signed)
 Primary Care Physician: Domenica Harlene LABOR, MD Primary Cardiologist: Redell Shallow, MD Electrophysiologist: Will Gladis Norton, MD  Referring Physician: Dr Shallow [name] is a 78 y.o. female with a history of CAD, HTN, HLD, OSA, carotid disease, RA, CKD, atrial fibrillation who presents for follow up in the [facility]. She has a longstanding history of atrial fibrillation. She has been on Tikosyn  for many years. Patient is on warfarin for stroke prevention.   Patient returns for follow up for atrial fibrillation and dofetilide  monitoring. She reports that she had one episode of afib in July, otherwise maintaining SR. No bleeding issues on anticoagulation.   Today, she  denies symptoms of palpitations, chest pain, shortness of breath, orthopnea, PND, lower extremity edema, dizziness, presyncope, syncope, bleeding, or neurologic sequela. The patient is tolerating medications without difficulties and is otherwise without complaint today.    Atrial Fibrillation Risk Factors:  she does have symptoms or diagnosis of sleep apnea. she does not have a history of rheumatic fever.   Atrial Fibrillation Management history:  Previous antiarrhythmic drugs: amiodarone, Tikosyn  Previous ablations: none Anticoagulation history: warfarin  ROS- All systems are reviewed and negative except as per the HPI above.  Past Medical History:  Diagnosis Date   Allergy    Anxiety    Arthritis    Atrial fibrillation (HCC) 2008   Blood transfusion without reported diagnosis    CAD (coronary artery disease)    Crohn's colitis (HCC)    she reports ulcerative colitis beginning in her 44's, biopsies 2008 suggest Crohn's not UC   Depression    Diverticulosis    GERD (gastroesophageal reflux disease)    Hyperlipidemia    Hypertension    IBS (irritable bowel syndrome)    Keloid of skin    Obesity    OSA (obstructive sleep apnea)    Pancreatitis    Paroxysmal  atrial fibrillation (HCC)    Pseudoaneurysm of right femoral artery (HCC)    Renal oncocytoma    s/p right nephrectomy   Rheumatoid arthritis (HCC) 05/29/2017   Sleep apnea    UC (ulcerative colitis) (HCC)     Current Outpatient Medications  Medication Sig Dispense Refill   amLODipine  (NORVASC ) 10 MG tablet TAKE 1 TABLET BY MOUTH EVERY DAY 90 tablet 3   dextrose 5 % SOLN 50 mL with methotrexate 25 MG/ML (PF) SOLN 40 mg/m2 Inject 40 mg/m2 into the vein once a week. Every wednesday     dofetilide  (TIKOSYN ) 125 MCG capsule TAKE 3 CAPSULES BY MOUTH TWICE A DAY 540 capsule 3   doxazosin  (CARDURA ) 2 MG tablet TAKE 1 TABLET BY MOUTH EVERY DAY 90 tablet 3   escitalopram  (LEXAPRO ) 10 MG tablet TAKE 1 TABLET BY MOUTH EVERY DAY 90 tablet 1   esomeprazole  (NEXIUM ) 40 MG capsule Take 1 capsule (40 mg total) by mouth daily. Take in the morning before breakfast. 30 capsule 11   folic acid (FOLVITE) 1 MG tablet Take 2 mg by mouth daily. (Patient taking differently: Take 1 mg by mouth 2 (two) times daily.)     inFLIXimab  10 mg/kg in sodium chloride  0.9 % Inject 10 mg/kg into the vein every 6 (six) weeks.     loperamide (IMODIUM A-D) 2 MG tablet Take 1 mg by mouth as needed. Dihrrhea     metoprolol  tartrate (LOPRESSOR ) 25 MG tablet Take 0.5 tablets (12.5 mg total) by mouth 2 (two) times daily. 90 tablet 3   Multiple Vitamin (  MULTIVITAMIN) capsule Take 1 capsule by mouth daily. 1 daily     nitroGLYCERIN  (NITROSTAT ) 0.4 MG SL tablet Place 1 tablet (0.4 mg total) under the tongue every 5 (five) minutes as needed for chest pain. Do not exceed 3 tabs in 15 minutes 75 tablet 3   Probiotic Product (PHILLIPS COLON HEALTH) CAPS Take 1 capsule by mouth daily. 1 per day     TUBERCULIN SYR 1CC/27GX1/2 27G X 1/2 1 ML MISC      valsartan  (DIOVAN ) 160 MG tablet Take 1 tablet (160 mg total) by mouth daily. 90 tablet 3   warfarin (COUMADIN ) 2.5 MG tablet TAKE 1/2 TABLET TO 1 TABLET BY MOUTH DAILY AS DIRECTED BY COUMADIN   CLINIC 75 tablet 1   clobetasol ointment (TEMOVATE) 0.05 % Apply 1 Application topically 2 (two) times daily. (Patient not taking: Reported on 06/13/2024)     hydrocortisone  (ANUSOL -HC) 25 MG suppository Place 1 suppository (25 mg total) rectally 2 (two) times daily as needed for hemorrhoids or anal itching. (Patient not taking: Reported on 06/13/2024) 12 suppository 1   neomycin -polymyxin-hydrocortisone  (CORTISPORIN) OTIC solution Place 3 drops into the left ear 3 (three) times daily. (Patient not taking: Reported on 06/13/2024) 10 mL 0   No current facility-administered medications for this encounter.    Physical Exam: BP (!) 194/84   Pulse (!) 55   Ht 5' 3 (1.6 m)   Wt 109 kg   BMI 42.55 kg/m   GEN: Well nourished, well developed in no acute distress CARDIAC: Regular rate and rhythm, no murmurs, rubs, gallops RESPIRATORY:  Clear to auscultation without rales, wheezing or rhonchi  ABDOMEN: Soft, non-tender, non-distended EXTREMITIES:  No edema; No deformity    Wt Readings from Last 3 Encounters:  06/13/24 109 kg  06/11/24 108 kg  06/10/24 108 kg     EKG today demonstrates  SB Vent. rate 55 BPM PR interval 176 ms QRS duration 88 ms QT/QTcB 478/457 ms   Echo 09/12/23 demonstrated   1. Left ventricular ejection fraction, by estimation, is 70 to 75%. Left  ventricular ejection fraction by 3D volume is 73 %. The left ventricle has  hyperdynamic function. The left ventricle has no regional wall motion  abnormalities. There is mild concentric left ventricular hypertrophy. Left ventricular diastolic parameters are consistent with Grade II diastolic dysfunction (pseudonormalization). Elevated left ventricular end-diastolic pressure. The average left ventricular global longitudinal strain is -17.4 %. The global longitudinal strain is normal.   2. Right ventricular systolic function is normal. The right ventricular  size is moderately enlarged. There is mildly elevated pulmonary  artery  systolic pressure. The estimated right ventricular systolic pressure is  44.0 mmHg.   3. Left atrial size was severely dilated.   4. Right atrial size was mildly dilated.   5. The mitral valve is normal in structure. Trivial mitral valve  regurgitation. No evidence of mitral stenosis.   6. The aortic valve is tricuspid. Aortic valve regurgitation is not  visualized. Aortic valve sclerosis/calcification is present, without any  evidence of aortic stenosis. Aortic valve area, by VTI measures 2.35 cm.  Aortic valve mean gradient measures  8.0 mmHg. Aortic valve Vmax measures 1.75 m/s.   7. The inferior vena cava is normal in size with greater than 50%  respiratory variability, suggesting right atrial pressure of 3 mmHg.     CHA2DS2-VASc Score = 5  The patient's score is based upon: CHF History: 0 HTN History: 1 Diabetes History: 0 Stroke History: 0 Vascular Disease History:  1 Age Score: 2 Gender Score: 1       ASSESSMENT AND PLAN: Persistent Atrial Fibrillation (ICD10:  I48.19) The patient's CHA2DS2-VASc score is 5, indicating a 7.2% annual risk of stroke.   Patient appears to be maintaining SR with low burden of afib. Continue dofetilide  375 mcg BID Continue Lopressor  12.5 mg BID Continue warfarin If she fails dofetilide , she does not want to rechallenge amiodarone due to intolerable side effects (hallucinations).   Secondary Hypercoagulable State (ICD10:  D68.69) The patient is at significant risk for stroke/thromboembolism based upon her CHA2DS2-VASc Score of 5.  Continue Warfarin (Coumadin ). No bleeding issues.   High Risk Medication Monitoring (ICD 10: U5195107) Patient requires ongoing monitoring for anti-arrhythmic medication which has the potential to cause life threatening arrhythmias. QT interval on ECG acceptable for dofetilide  monitoring. Check bmet/mag today.     CAD No anginal symptoms Followed by Dr Pietro  OSA  Encouraged nightly  CPAP  HTN Elevated initially, better on recheck Readings within normal range at home.   SVT Frequent episodes noted on cardiac monitor Continue BB as above.    Follow up in the AF clinic in 6 months.      Daril Kicks PA-C [facility] [address] [place], [place] [address] [phone]

## 2024-06-14 ENCOUNTER — Ambulatory Visit (HOSPITAL_COMMUNITY): Payer: Self-pay | Admitting: Physician Assistant

## 2024-06-14 LAB — BASIC METABOLIC PANEL WITH GFR
BUN/Creatinine Ratio: 18 (ref 12–28)
BUN: 22 mg/dL (ref 8–27)
CO2: 20 mmol/L (ref 20–29)
Calcium: 9.2 mg/dL (ref 8.7–10.3)
Chloride: 103 mmol/L (ref 96–106)
Creatinine, Ser: 1.25 mg/dL — ABNORMAL HIGH (ref 0.57–1.00)
Glucose: 87 mg/dL (ref 70–99)
Potassium: 4.6 mmol/L (ref 3.5–5.2)
Sodium: 139 mmol/L (ref 134–144)
eGFR: 44 mL/min/1.73 — ABNORMAL LOW (ref >59)

## 2024-06-14 LAB — MAGNESIUM: Magnesium: 2.2 mg/dL (ref 1.6–2.3)

## 2024-06-17 DIAGNOSIS — M0589 Other rheumatoid arthritis with rheumatoid factor of multiple sites: Secondary | ICD-10-CM | POA: Diagnosis not present

## 2024-06-17 DIAGNOSIS — R5383 Other fatigue: Secondary | ICD-10-CM | POA: Diagnosis not present

## 2024-06-17 DIAGNOSIS — Z79899 Other long term (current) drug therapy: Secondary | ICD-10-CM | POA: Diagnosis not present

## 2024-06-17 DIAGNOSIS — Z111 Encounter for screening for respiratory tuberculosis: Secondary | ICD-10-CM | POA: Diagnosis not present

## 2024-07-03 ENCOUNTER — Ambulatory Visit (INDEPENDENT_AMBULATORY_CARE_PROVIDER_SITE_OTHER): Admitting: Podiatry

## 2024-07-03 DIAGNOSIS — B351 Tinea unguium: Secondary | ICD-10-CM | POA: Diagnosis not present

## 2024-07-03 DIAGNOSIS — M25571 Pain in right ankle and joints of right foot: Secondary | ICD-10-CM | POA: Diagnosis not present

## 2024-07-03 DIAGNOSIS — R262 Difficulty in walking, not elsewhere classified: Secondary | ICD-10-CM

## 2024-07-03 DIAGNOSIS — M069 Rheumatoid arthritis, unspecified: Secondary | ICD-10-CM

## 2024-07-03 DIAGNOSIS — M79675 Pain in left toe(s): Secondary | ICD-10-CM | POA: Diagnosis not present

## 2024-07-03 DIAGNOSIS — M79674 Pain in right toe(s): Secondary | ICD-10-CM | POA: Diagnosis not present

## 2024-07-03 DIAGNOSIS — M25572 Pain in left ankle and joints of left foot: Secondary | ICD-10-CM

## 2024-07-03 NOTE — Progress Notes (Signed)
 Subjective:  Patient ID: [name], female    DOB: Jul 21, 1946,  MRN: [id number]  [name] presents to clinic today for:  Chief Complaint  Patient presents with   RFC    RFC no callous, she has been using Swiss Klip OTC nail treatment.  Not diabetic  Warrfrin.   Patient notes nails are thick, discolored, elongated and painful in shoegear when trying to ambulate.  Notes since the weather has been getting colder, her arthritis pain is getting much worse and she is very uncomfortable.  States her feet are hurting all the time.  PCP is Domenica Harlene LABOR, MD. last seen 06/10/2024  Past Medical History:  Diagnosis Date   Allergy    Anxiety    Arthritis    Atrial fibrillation (HCC) 2008   Blood transfusion without reported diagnosis    CAD (coronary artery disease)    Crohn's colitis (HCC)    she reports ulcerative colitis beginning in her 48's, biopsies 2008 suggest Crohn's not UC   Depression    Diverticulosis    GERD (gastroesophageal reflux disease)    Hyperlipidemia    Hypertension    IBS (irritable bowel syndrome)    Keloid of skin    Obesity    OSA (obstructive sleep apnea)    Pancreatitis    Paroxysmal atrial fibrillation (HCC)    Pseudoaneurysm of right femoral artery (HCC)    Renal oncocytoma    s/p right nephrectomy   Rheumatoid arthritis (HCC) 05/29/2017   Sleep apnea    UC (ulcerative colitis) (HCC)    Past Surgical History:  Procedure Laterality Date   ANGIOPLASTY     BREAST BIOPSY  10/24/1994   benign lesion    Cataract surgery Left 11/13/2014   CHOLECYSTECTOMY  10/24/2006   COLONOSCOPY W/ BIOPSIES     CORONARY ANGIOPLASTY     ESOPHAGOGASTRODUODENOSCOPY     EYE SURGERY     NEPHRECTOMY  10/24/2006   right secondary to oncocytoma    RIGHT FEMORAL  PSEUDOANEURYSM & RIGHT GROIN HEMATOMA EVACUATION  02/22/2007   RIGHT RENAL ARTERY REPAIR  10/24/1974   TUBAL LIGATION     Allergies  Allergen Reactions   Hydralazine  Diarrhea and Nausea  Only    Anorexia, upset stomach, burning pain in abdomin   Cefdinir      Pt cannot take; may interfere with AFib.   Codeine     Heart beats fast    Fosamax [Alendronate Sodium] Other (See Comments)    GI upset   Gluten Meal    Guaifenesin Er Nausea And Vomiting and Other (See Comments)    Headaches; can tolerate liquid.   Latex Other (See Comments)    Breathing problems   Percocet [Oxycodone -Acetaminophen ]     Itching & swelling in jaw Pt said she can take tylenol    Psyllium Other (See Comments)    Review of Systems: Negative except as noted in the HPI.  Objective:  [name] is a pleasant 78 y.o. female in NAD. AAO x 3.  Vascular Examination: Capillary refill time is 3-5 seconds to toes bilateral. Palpable pedal pulses b/l LE. Digital hair present b/l.  Skin temperature gradient WNL b/l. No varicosities b/l. No cyanosis noted b/l.   Dermatological Examination: Pedal skin with normal turgor, texture and tone b/l. No open wounds. No interdigital macerations b/l. Toenails x10 are 3mm thick, discolored, dystrophic with subungual debris. There is pain with compression of the nail plates.  They are elongated x10  Orthopedic Examination: Ankle dorsiflexion less than 10 degrees with the knee extended bilateral.  Pain on palpation to the metatarsal phalangeal joints 1 through 5 bilateral.  Manual muscle testing 5/5.  Generalized pain on palpation of the instep and heel bilateral.      Latest Ref Rng & Units 06/10/2024   11:27 AM 01/18/2024   11:24 AM  Hemoglobin A1C  Hemoglobin-A1c 4.6 - 6.5 % 5.8  5.8    Assessment/Plan: 1. Pain due to onychomycosis of toenails of both feet   2. Rheumatoid arthritis, involving unspecified site, unspecified whether rheumatoid factor present (HCC)   3. Pain in joint of left foot   4. Pain in joint of right foot    The mycotic toenails were sharply debrided x10 with sterile nail nippers and a power debriding burr to decrease bulk/thickness  and length.    Discussed findings with the patient today.  I do feel that the patient would benefit from orthopedic shoes with custom insoles for her continued pain with the rheumatoid arthritis and psoriatic arthritis.  She notes that with the change in temperatures and cooler temperatures, that this is really flaring up her arthritic pain and her feet are hurting all the time.  If we are able to support the foot better and provide more structure, this could decrease pain and improve function of the feet to keep her ambulatory for as long as possible.  Will get her referred to [facility] for orthopedic shoes and custom insoles.  F/u 3 months   Deette Revak DSABRA Imperial, DPM, FACFAS [facility]     [address] [place], [place] [address]                Office [phone]  Fax [phone]

## 2024-07-05 ENCOUNTER — Other Ambulatory Visit: Payer: Self-pay | Admitting: Cardiology

## 2024-07-05 DIAGNOSIS — I4891 Unspecified atrial fibrillation: Secondary | ICD-10-CM

## 2024-07-05 NOTE — Telephone Encounter (Signed)
 Warfarin 2.5mg  refill Afib Last INR8/12/25 Last OV 06/13/24

## 2024-07-09 ENCOUNTER — Ambulatory Visit: Attending: Cardiology

## 2024-07-09 DIAGNOSIS — Z5181 Encounter for therapeutic drug level monitoring: Secondary | ICD-10-CM | POA: Diagnosis not present

## 2024-07-09 DIAGNOSIS — I4891 Unspecified atrial fibrillation: Secondary | ICD-10-CM | POA: Insufficient documentation

## 2024-07-09 LAB — POCT INR: INR: 2.4 (ref 2.0–3.0)

## 2024-07-09 NOTE — Patient Instructions (Signed)
 Continue taking  2.5mg  daily EXCEPT 1.25mg  on Sunday, Tuesdays, and Thursdays Stay consistent with green each week  (2 per week)  Repeat INR in 6 weeks.  Coumadin  Clinic [phone]

## 2024-07-13 NOTE — Progress Notes (Signed)
 HPI: FU coronary artery disease and atrial fibrillation. Last cardiac catheterization on Mar 06, 2007 showed an ejection fraction of 55%. There is nonobstructive plaque in the LAD, and circumflex and the right coronary artery was normal. 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 Also note the patient is extremely sensitive to Coumadin . MRA of the abdomen in September of 2012 showed no left renal artery stenosis and previous right nephrectomy. 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 Carotid dopplers 11/21 showed 1-39 bilateral stenosis. Echocardiogram November 2024 showed normal LV function, mild left ventricular hypertrophy, grade 2 diastolic dysfunction, moderate right ventricular enlargement, severe left atrial enlargement, mild right atrial enlargement.  Monitor June 2025 showed 5 beats nonsustained ventricular tachycardia, no atrial fibrillation, short runs of SVT.  Since I last saw her, she has mild dyspnea on exertion but no orthopnea, PND, chest pain or syncope.  Minimal pedal edema.  She did have 1 episode of atrial fibrillation in September but resolved after 5 days spontaneously.  Current Outpatient Medications  Medication Sig Dispense Refill   amLODipine  (NORVASC ) 10 MG tablet TAKE 1 TABLET BY MOUTH EVERY DAY 90 tablet 3   dextrose 5 % SOLN 50 mL with methotrexate 25 MG/ML (PF) SOLN 40 mg/m2 Inject 40 mg/m2 into the vein once a week. Every wednesday     dofetilide  (TIKOSYN ) 125 MCG capsule TAKE 3 CAPSULES BY MOUTH TWICE A DAY 540 capsule 3   doxazosin  (CARDURA ) 2 MG tablet TAKE 1 TABLET BY MOUTH EVERY DAY 90 tablet 3   escitalopram  (LEXAPRO ) 10 MG tablet TAKE 1 TABLET BY MOUTH EVERY DAY 90 tablet 1   esomeprazole  (NEXIUM ) 40 MG capsule Take 1 capsule (40 mg total) by mouth daily. Take in the morning before breakfast. 30 capsule 11   folic acid (FOLVITE) 1 MG tablet  Take 2 mg by mouth daily. (Patient taking differently: Take 1 mg by mouth 2 (two) times daily.)     inFLIXimab  10 mg/kg in sodium chloride  0.9 % Inject 10 mg/kg into the vein every 6 (six) weeks.     loperamide (IMODIUM A-D) 2 MG tablet Take 1 mg by mouth as needed. Dihrrhea     metoprolol  tartrate (LOPRESSOR ) 25 MG tablet Take 0.5 tablets (12.5 mg total) by mouth 2 (two) times daily. 90 tablet 3   Multiple Vitamin (MULTIVITAMIN) capsule Take 1 capsule by mouth daily. 1 daily     neomycin -polymyxin-hydrocortisone  (CORTISPORIN) OTIC solution Place 3 drops into the left ear 3 (three) times daily. 10 mL 0   nitroGLYCERIN  (NITROSTAT ) 0.4 MG SL tablet Place 1 tablet (0.4 mg total) under the tongue every 5 (five) minutes as needed for chest pain. Do not exceed 3 tabs in 15 minutes 75 tablet 3   Probiotic Product (PHILLIPS COLON HEALTH) CAPS Take 1 capsule by mouth daily. 1 per day     TUBERCULIN SYR 1CC/27GX1/2 27G X 1/2 1 ML MISC      valsartan  (DIOVAN ) 160 MG tablet Take 1 tablet (160 mg total) by mouth daily. 90 tablet 3   warfarin (COUMADIN ) 2.5 MG tablet TAKE 1/2 TABLET TO 1 TABLET BY MOUTH DAILY AS DIRECTED BY COUMADIN  CLINIC 75 tablet 1   clobetasol ointment (TEMOVATE) 0.05 % Apply 1 Application topically 2 (two) times daily. (Patient not taking: Reported on 07/24/2024)     hydrocortisone  (ANUSOL -HC) 25 MG suppository Place 1 suppository (25  mg total) rectally 2 (two) times daily as needed for hemorrhoids or anal itching. (Patient not taking: Reported on 07/24/2024) 12 suppository 1   No current facility-administered medications for this visit.     Past Medical History:  Diagnosis Date   Allergy    Anxiety    Arthritis    Atrial fibrillation (HCC) 2008   Blood transfusion without reported diagnosis    CAD (coronary artery disease)    Crohn's colitis (HCC)    she reports ulcerative colitis beginning in her 77's, biopsies 2008 suggest Crohn's not UC   Depression    Diverticulosis    GERD  (gastroesophageal reflux disease)    Hyperlipidemia    Hypertension    IBS (irritable bowel syndrome)    Keloid of skin    Obesity    OSA (obstructive sleep apnea)    Pancreatitis    Paroxysmal atrial fibrillation (HCC)    Pseudoaneurysm of right femoral artery    Renal oncocytoma    s/p right nephrectomy   Rheumatoid arthritis (HCC) 05/29/2017   Sleep apnea    UC (ulcerative colitis) (HCC)     Past Surgical History:  Procedure Laterality Date   ANGIOPLASTY     BREAST BIOPSY  10/24/1994   benign lesion    Cataract surgery Left 11/13/2014   CHOLECYSTECTOMY  10/24/2006   COLONOSCOPY W/ BIOPSIES     CORONARY ANGIOPLASTY     ESOPHAGOGASTRODUODENOSCOPY     EYE SURGERY     NEPHRECTOMY  10/24/2006   right secondary to oncocytoma    RIGHT FEMORAL  PSEUDOANEURYSM & RIGHT GROIN HEMATOMA EVACUATION  02/22/2007   RIGHT RENAL ARTERY REPAIR  10/24/1974   TUBAL LIGATION      Social History   Socioeconomic History   Marital status: Married    Spouse name: Not on file   Number of children: 3   Years of education: Not on file   Highest education level: 12th grade  Occupational History   Occupation: Retired    Associate Professor: RETIRED    Comment: retired  Tobacco Use   Smoking status: Never   Smokeless tobacco: Never   Tobacco comments:    Never smoked 03/06/24  Vaping Use   Vaping status: Never Used  Substance and Sexual Activity   Alcohol use: No   Drug use: No   Sexual activity: Not Currently    Birth control/protection: Condom, Other-see comments    Comment: Tbal ligation  Other Topics Concern   Not on file  Social History Narrative   Married since 1965 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, 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 - adults, 2 grandchildrenNo pets -Daughter has breast cancer   Social Drivers of Corporate investment banker Strain: Low Risk  (06/08/2024)   Overall Financial Resource Strain (CARDIA)    Difficulty of Paying Living Expenses: Not hard at all  Food  Insecurity: No Food Insecurity (06/08/2024)   Hunger Vital Sign    Worried About Running Out of Food in the Last Year: Never true    Ran Out of Food in the Last Year: Never true  Transportation Needs: No Transportation Needs (06/08/2024)   PRAPARE - Administrator, Civil Service (Medical): No    Lack of Transportation (Non-Medical): No  Physical Activity: Patient Declined (06/08/2024)   Exercise Vital Sign    Days of Exercise per Week: Patient declined    Minutes of Exercise per Session: Patient declined  Stress: No Stress Concern Present (06/08/2024)   [employer]  of Occupational Health - Occupational Stress Questionnaire    Feeling of Stress: Not at all  Social Connections: Unknown (06/08/2024)   Social Connection and Isolation Panel    Frequency of Communication with Friends and Family: More than three times a week    Frequency of Social Gatherings with Friends and Family: Three times a week    Attends Religious Services: Patient declined    Active Member of Clubs or Organizations: Patient declined    Attends Banker Meetings: Patient declined    Marital Status: Married  Catering manager Violence: Not At Risk (06/11/2024)   Humiliation, Afraid, Rape, and Kick questionnaire    Fear of Current or Ex-Partner: No    Emotionally Abused: No    Physically Abused: No    Sexually Abused: No    Family History  Problem Relation Age of Onset   Hypertension Mother    Coronary artery disease Mother    Stroke Mother    Clotting disorder Mother    Heart disease Mother    Allergies Mother    Colitis Father    Heart disease Father    Clotting disorder Sister    Stroke Brother    Clotting disorder Brother    Cancer Daughter    Asthma Daughter    Asthma Maternal Uncle    Colon cancer Paternal Aunt    Cancer Daughter    Colon polyps Neg Hx    Kidney disease Neg Hx    Stomach cancer Neg Hx    Esophageal cancer Neg Hx     ROS: no fevers or chills, productive  cough, hemoptysis, dysphasia, odynophagia, melena, hematochezia, dysuria, hematuria, rash, seizure activity, orthopnea, PND, pedal edema, claudication. Remaining systems are negative.  Physical Exam: Well-developed well-nourished in no acute distress.  Skin is warm and dry.  HEENT is normal.  Neck is supple.  Chest is clear to auscultation with normal expansion.  Cardiovascular exam is regular rate and rhythm.  Abdominal exam nontender or distended. No masses palpated. Extremities show no edema. neuro grossly intact   A/P  1 paroxysmal atrial fibrillation-continue Tikosyn  and Coumadin .  Previously did not tolerate DOAC's.  2 coronary artery disease-continue medical therapy.  She is not having chest pain.  Note she is intolerant to statins.  3 hypertension-blood pressure elevated; she did not tolerate valsartan  320 mg daily.  She would like to try 240 mg daily.  She will contact us  with follow-up blood pressures and we will advance regimen if needed.  4 hyperlipidemia-patient is intolerant to statins and previously declined other lipid-lowering medications.  5 carotid artery disease-mild on most recent Dopplers.  Redell Shallow, MD

## 2024-07-23 ENCOUNTER — Ambulatory Visit (INDEPENDENT_AMBULATORY_CARE_PROVIDER_SITE_OTHER): Admitting: *Deleted

## 2024-07-23 DIAGNOSIS — Z23 Encounter for immunization: Secondary | ICD-10-CM | POA: Diagnosis not present

## 2024-07-24 ENCOUNTER — Ambulatory Visit: Attending: Cardiology | Admitting: Cardiology

## 2024-07-24 ENCOUNTER — Encounter: Payer: Self-pay | Admitting: Cardiology

## 2024-07-24 VITALS — BP 173/70 | HR 58 | Ht 63.0 in

## 2024-07-24 DIAGNOSIS — I480 Paroxysmal atrial fibrillation: Secondary | ICD-10-CM | POA: Diagnosis not present

## 2024-07-24 DIAGNOSIS — I2510 Atherosclerotic heart disease of native coronary artery without angina pectoris: Secondary | ICD-10-CM | POA: Insufficient documentation

## 2024-07-24 DIAGNOSIS — I10 Essential (primary) hypertension: Secondary | ICD-10-CM | POA: Insufficient documentation

## 2024-07-24 DIAGNOSIS — E7800 Pure hypercholesterolemia, unspecified: Secondary | ICD-10-CM | POA: Diagnosis not present

## 2024-07-24 MED ORDER — VALSARTAN 160 MG PO TABS: ORAL_TABLET | ORAL | 3 refills | Status: AC

## 2024-07-24 NOTE — Patient Instructions (Signed)
 Medication Instructions:   INCREASE VALSARTAN  TAKE 1 AND 1/2 TABLETS ONCE DAILY  *If you need a refill on your cardiac medications before your next appointment, please call your pharmacy*    Follow-Up: At [facility], you and your health needs are our priority.  As part of our continuing mission to provide you with exceptional heart care, our providers are all part of one team.  This team includes your primary Cardiologist (physician) and Advanced Practice Providers or APPs (Physician Assistants and Nurse Practitioners) who all work together to provide you with the care you need, when you need it.  Your next appointment:   6 month(s)  Provider:   Lamar Fitch, MD

## 2024-07-25 DIAGNOSIS — H903 Sensorineural hearing loss, bilateral: Secondary | ICD-10-CM | POA: Diagnosis not present

## 2024-07-29 DIAGNOSIS — M0589 Other rheumatoid arthritis with rheumatoid factor of multiple sites: Secondary | ICD-10-CM | POA: Diagnosis not present

## 2024-07-30 ENCOUNTER — Telehealth: Payer: Self-pay | Admitting: Cardiology

## 2024-07-30 NOTE — Telephone Encounter (Signed)
 Called and unable to reach pt. Left message for pt to return call.

## 2024-07-30 NOTE — Telephone Encounter (Signed)
 Pt c/o medication issue:  1. Name of Medication:   valsartan  (DIOVAN ) 160 MG tablet  2. How are you currently taking this medication (dosage and times per day)?   As prescribed  3. Are you having a reaction (difficulty breathing--STAT)?   4. What is your medication issue?   Patient stated she has been taking 1-1/2 tablets of this medication but the 1/2 tablet has a rough edge when cut and gets stuck in her throat.  Patient wants to get a new prescription for 40 mg instead sent to [id number] - [place], [place] - [address]

## 2024-07-31 MED ORDER — VALSARTAN 80 MG PO TABS: 80.0000 mg | ORAL_TABLET | Freq: Every day | ORAL | 3 refills | Status: DC

## 2024-08-02 ENCOUNTER — Other Ambulatory Visit: Payer: Self-pay | Admitting: *Deleted

## 2024-08-02 ENCOUNTER — Encounter: Payer: Self-pay | Admitting: Family Medicine

## 2024-08-02 ENCOUNTER — Other Ambulatory Visit: Payer: Self-pay | Admitting: Family Medicine

## 2024-08-02 ENCOUNTER — Encounter: Payer: Self-pay | Admitting: Cardiology

## 2024-08-02 MED ORDER — ESCITALOPRAM OXALATE 10 MG PO TABS: 10.0000 mg | ORAL_TABLET | Freq: Every day | ORAL | 1 refills | Status: AC

## 2024-08-03 ENCOUNTER — Encounter: Payer: Self-pay | Admitting: Cardiology

## 2024-08-19 ENCOUNTER — Ambulatory Visit: Admitting: Family Medicine

## 2024-08-20 ENCOUNTER — Ambulatory Visit: Attending: Cardiology

## 2024-08-20 DIAGNOSIS — I4891 Unspecified atrial fibrillation: Secondary | ICD-10-CM | POA: Diagnosis not present

## 2024-08-20 DIAGNOSIS — Z5181 Encounter for therapeutic drug level monitoring: Secondary | ICD-10-CM | POA: Insufficient documentation

## 2024-08-20 LAB — POCT INR: INR: 2.1 (ref 2.0–3.0)

## 2024-08-20 NOTE — Patient Instructions (Signed)
 Continue taking  2.5mg  daily EXCEPT 1.25mg  on Sunday, Tuesdays, and Thursdays Stay consistent with green each week  (2 per week)  Repeat INR in 6 weeks.  Coumadin  Clinic [phone]

## 2024-08-21 ENCOUNTER — Ambulatory Visit (HOSPITAL_BASED_OUTPATIENT_CLINIC_OR_DEPARTMENT_OTHER): Admitting: Radiology

## 2024-08-21 ENCOUNTER — Other Ambulatory Visit (HOSPITAL_BASED_OUTPATIENT_CLINIC_OR_DEPARTMENT_OTHER): Admitting: Radiology

## 2024-08-26 ENCOUNTER — Encounter: Payer: Self-pay | Admitting: Cardiology

## 2024-08-26 ENCOUNTER — Encounter (HOSPITAL_BASED_OUTPATIENT_CLINIC_OR_DEPARTMENT_OTHER): Payer: Self-pay

## 2024-08-26 NOTE — Telephone Encounter (Signed)
 Spoke with pt, she reports swelling in her feet and ankles as well as her abdomen that seems to be some better today. It can be there in the morning but it usually gets worse by the end of the day. She denies orthopnea. She does report a fluctuation in her weight 2-3 pounds overnight. She reports SOB with walking in the home. She does not have palpitations. Follow up scheduled with APP tomorrow.

## 2024-08-27 ENCOUNTER — Encounter: Payer: Self-pay | Admitting: Nurse Practitioner

## 2024-08-27 ENCOUNTER — Ambulatory Visit: Attending: Nurse Practitioner | Admitting: Nurse Practitioner

## 2024-08-27 VITALS — BP 193/62 | HR 56 | Ht 63.0 in | Wt 252.0 lb

## 2024-08-27 DIAGNOSIS — I10 Essential (primary) hypertension: Secondary | ICD-10-CM | POA: Diagnosis not present

## 2024-08-27 DIAGNOSIS — E785 Hyperlipidemia, unspecified: Secondary | ICD-10-CM | POA: Insufficient documentation

## 2024-08-27 DIAGNOSIS — R600 Localized edema: Secondary | ICD-10-CM | POA: Insufficient documentation

## 2024-08-27 DIAGNOSIS — I6523 Occlusion and stenosis of bilateral carotid arteries: Secondary | ICD-10-CM | POA: Diagnosis not present

## 2024-08-27 DIAGNOSIS — I2510 Atherosclerotic heart disease of native coronary artery without angina pectoris: Secondary | ICD-10-CM | POA: Diagnosis not present

## 2024-08-27 DIAGNOSIS — I480 Paroxysmal atrial fibrillation: Secondary | ICD-10-CM | POA: Diagnosis not present

## 2024-08-27 MED ORDER — POTASSIUM CHLORIDE CRYS ER 20 MEQ PO TBCR: EXTENDED_RELEASE_TABLET | ORAL | 0 refills | Status: DC

## 2024-08-27 MED ORDER — SPIRONOLACTONE 25 MG PO TABS: 12.5000 mg | ORAL_TABLET | Freq: Every day | ORAL | 2 refills | Status: AC

## 2024-08-27 MED ORDER — CARVEDILOL 6.25 MG PO TABS: 6.2500 mg | ORAL_TABLET | Freq: Two times a day (BID) | ORAL | 2 refills | Status: DC

## 2024-08-27 MED ORDER — FUROSEMIDE 40 MG PO TABS: ORAL_TABLET | ORAL | 0 refills | Status: DC

## 2024-08-27 NOTE — Patient Instructions (Addendum)
 Medication Instructions:  Stop Metoprolol  Tartrate 25 mg as directed Start Lasix  (Furosemide ) 40 mg daily for 3 days only,  Start Spironolactone 12.5 mg daily Start Carvedilol 6.25 mg twice daily  *If you need a refill on your cardiac medications before your next appointment, please call your pharmacy*  Lab Work: CBC, Magnesium , BMET today BMET, Magnesium  Friday  Testing/Procedures: NONE ordered at this time of appointment    Follow-Up: At [facility], you and your health needs are our priority.  As part of our continuing mission to provide you with exceptional heart care, our providers are all part of one team.  This team includes your primary Cardiologist (physician) and Advanced Practice Providers or APPs (Physician Assistants and Nurse Practitioners) who all work together to provide you with the care you need, when you need it.  Your next appointment:   Pharm-D apt -next available   2-3 month Dr. Pietro Keep appointment with Dr. Barbaraann   We recommend signing up for the patient portal called MyChart.  Sign up information is provided on this After Visit Summary.  MyChart is used to connect with patients for Virtual Visits (Telemedicine).  Patients are able to view lab/test results, encounter notes, upcoming appointments, etc.  Non-urgent messages can be sent to your provider as well.   To learn more about what you can do with MyChart, go to [email or web address].   Other Instructions Monitor Blood pressure and heart rate. Report Systolic BP (top number) consistently greater than 150. Report hear rate consistently less than 50 beats per minute.

## 2024-08-27 NOTE — Progress Notes (Unsigned)
 Office Visit    Patient Name: [name] Date of Encounter: 08/27/2024  Primary Care Provider:  Domenica Harlene LABOR, MD Primary Cardiologist:  Redell Shallow, MD  Chief Complaint    78 year old female with a history of nonobstructive CAD, paroxysmal atrial fibrillation, mild carotid artery stenosis, hypertension, hyperlipidemia, renal oncocytoma s/p right nephrectomy, Crohn's disease, OSA and GERD who presents for follow-up related to CAD and atrial fibrillation.  Past Medical History    Past Medical History:  Diagnosis Date   Allergy    Anxiety    Arthritis    Atrial fibrillation (HCC) 2008   Blood transfusion without reported diagnosis    CAD (coronary artery disease)    Crohn's colitis (HCC)    she reports ulcerative colitis beginning in her 35's, biopsies 2008 suggest Crohn's not UC   Depression    Diverticulosis    GERD (gastroesophageal reflux disease)    Hyperlipidemia    Hypertension    IBS (irritable bowel syndrome)    Keloid of skin    Obesity    OSA (obstructive sleep apnea)    Pancreatitis    Paroxysmal atrial fibrillation (HCC)    Pseudoaneurysm of right femoral artery    Renal oncocytoma    s/p right nephrectomy   Rheumatoid arthritis (HCC) 05/29/2017   Sleep apnea    UC (ulcerative colitis) (HCC)    Past Surgical History:  Procedure Laterality Date   ANGIOPLASTY     BREAST BIOPSY  10/24/1994   benign lesion    Cataract surgery Left 11/13/2014   CHOLECYSTECTOMY  10/24/2006   COLONOSCOPY W/ BIOPSIES     CORONARY ANGIOPLASTY     ESOPHAGOGASTRODUODENOSCOPY     EYE SURGERY     NEPHRECTOMY  10/24/2006   right secondary to oncocytoma    RIGHT FEMORAL  PSEUDOANEURYSM & RIGHT GROIN HEMATOMA EVACUATION  02/22/2007   RIGHT RENAL ARTERY REPAIR  10/24/1974   TUBAL LIGATION      Allergies  Allergies  Allergen Reactions   Hydralazine  Diarrhea and Nausea Only    Anorexia, upset stomach, burning pain in abdomin   Cefdinir      Pt cannot take; may  interfere with AFib.   Codeine     Heart beats fast    Fosamax [Alendronate Sodium] Other (See Comments)    GI upset   Gluten Meal    Guaifenesin Er Nausea And Vomiting and Other (See Comments)    Headaches; can tolerate liquid.   Latex Other (See Comments)    Breathing problems   Percocet [Oxycodone -Acetaminophen ]     Itching & swelling in jaw Pt said she can take tylenol    Psyllium Other (See Comments)     Labs/Other Studies Reviewed    The following studies were reviewed today:  Cardiac Studies & Procedures   ______________________________________________________________________________________________     ECHOCARDIOGRAM  ECHOCARDIOGRAM COMPLETE 09/12/2023  Narrative ECHOCARDIOGRAM REPORT    Patient Name:   [name] Date of Exam: 09/12/2023 Medical Rec #:  [id number]        Height:       63.0 in Accession #:    [id number]       Weight:       245.8 lb Date of Birth:  Jun 15, 1946       BSA:          2.111 m Patient Age:    77 years         BP:  142/64 mmHg Patient Gender: F                HR:           58 bpm. Exam Location:  Inpatient  Procedure: 2D Echo, 3D Echo, Cardiac Doppler, Color Doppler and Strain Analysis  Indications:    Chest Pain  History:        Patient has prior history of Echocardiogram examinations, most recent 09/10/2020. Risk Factors:Sleep Apnea, Dyslipidemia and Hypertension.  Sonographer:    Ozell Free Referring Phys: [id number] OWEN A REGALADO   Sonographer Comments: Global longitudinal strain was attempted. IMPRESSIONS   1. Left ventricular ejection fraction, by estimation, is 70 to 75%. Left ventricular ejection fraction by 3D volume is 73 %. The left ventricle has hyperdynamic function. The left ventricle has no regional wall motion abnormalities. There is mild concentric left ventricular hypertrophy. Left ventricular diastolic parameters are consistent with Grade II diastolic dysfunction (pseudonormalization).  Elevated left ventricular end-diastolic pressure. The average left ventricular global longitudinal strain is -17.4 %. The global longitudinal strain is normal. 2. Right ventricular systolic function is normal. The right ventricular size is moderately enlarged. There is mildly elevated pulmonary artery systolic pressure. The estimated right ventricular systolic pressure is 44.0 mmHg. 3. Left atrial size was severely dilated. 4. Right atrial size was mildly dilated. 5. The mitral valve is normal in structure. Trivial mitral valve regurgitation. No evidence of mitral stenosis. 6. The aortic valve is tricuspid. Aortic valve regurgitation is not visualized. Aortic valve sclerosis/calcification is present, without any evidence of aortic stenosis. Aortic valve area, by VTI measures 2.35 cm. Aortic valve mean gradient measures 8.0 mmHg. Aortic valve Vmax measures 1.75 m/s. 7. The inferior vena cava is normal in size with greater than 50% respiratory variability, suggesting right atrial pressure of 3 mmHg.  FINDINGS Left Ventricle: Left ventricular ejection fraction, by estimation, is 70 to 75%. Left ventricular ejection fraction by 3D volume is 73 %. The left ventricle has hyperdynamic function. The left ventricle has no regional wall motion abnormalities. The average left ventricular global longitudinal strain is -17.4 %. The global longitudinal strain is normal. The left ventricular internal cavity size was normal in size. There is mild concentric left ventricular hypertrophy. Left ventricular diastolic parameters are consistent with Grade II diastolic dysfunction (pseudonormalization). Elevated left ventricular end-diastolic pressure.  Right Ventricle: The right ventricular size is moderately enlarged. No increase in right ventricular wall thickness. Right ventricular systolic function is normal. There is mildly elevated pulmonary artery systolic pressure. The tricuspid regurgitant velocity is 3.20 m/s,  and with an assumed right atrial pressure of 3 mmHg, the estimated right ventricular systolic pressure is 44.0 mmHg.  Left Atrium: Left atrial size was severely dilated.  Right Atrium: Right atrial size was mildly dilated.  Pericardium: There is no evidence of pericardial effusion.  Mitral Valve: The mitral valve is normal in structure. Trivial mitral valve regurgitation. No evidence of mitral valve stenosis.  Tricuspid Valve: The tricuspid valve is normal in structure. Tricuspid valve regurgitation is mild . No evidence of tricuspid stenosis.  Aortic Valve: The aortic valve is tricuspid. Aortic valve regurgitation is not visualized. Aortic valve sclerosis/calcification is present, without any evidence of aortic stenosis. Aortic valve mean gradient measures 8.0 mmHg. Aortic valve peak gradient measures 12.2 mmHg. Aortic valve area, by VTI measures 2.35 cm.  Pulmonic Valve: The pulmonic valve was normal in structure. Pulmonic valve regurgitation is not visualized. No evidence of pulmonic stenosis.  Aorta: The aortic root  is normal in size and structure.  Venous: The inferior vena cava is normal in size with greater than 50% respiratory variability, suggesting right atrial pressure of 3 mmHg.  IAS/Shunts: No atrial level shunt detected by color flow Doppler.   LEFT VENTRICLE PLAX 2D LVIDd:         4.60 cm         Diastology LVIDs:         2.80 cm         LV e' medial:    6.31 cm/s LV PW:         1.20 cm         LV E/e' medial:  18.4 LV IVS:        1.20 cm         LV e' lateral:   6.64 cm/s LVOT diam:     1.90 cm         LV E/e' lateral: 17.5 LV SV:         111 LV SV Index:   53              2D LVOT Area:     2.84 cm        Longitudinal Strain 2D Strain GLS  -17.8 % (A2C): 2D Strain GLS  -18.2 % (A3C): 2D Strain GLS  -16.3 % (A4C): 2D Strain GLS  -17.4 % Avg:  3D Volume EF LV 3D EF:    Left ventricul ar ejection fraction by 3D volume is 73 %.  3D Volume EF: 3D  EF:        73 % LV EDV:       151 ml LV ESV:       40 ml LV SV:        110 ml  RIGHT VENTRICLE             IVC RV Basal diam:  4.80 cm     IVC diam: 1.50 cm RV S prime:     12.30 cm/s TAPSE (M-mode): 2.4 cm  LEFT ATRIUM              Index        RIGHT ATRIUM           Index LA diam:        5.00 cm  2.37 cm/m   RA Area:     20.00 cm LA Vol (A2C):   92.9 ml  44.01 ml/m  RA Volume:   66.30 ml  31.41 ml/m LA Vol (A4C):   102.0 ml 48.32 ml/m LA Biplane Vol: 97.1 ml  46.00 ml/m AORTIC VALVE AV Area (Vmax):    2.40 cm AV Area (Vmean):   2.26 cm AV Area (VTI):     2.35 cm AV Vmax:           175.00 cm/s AV Vmean:          132.000 cm/s AV VTI:            0.473 m AV Peak Grad:      12.2 mmHg AV Mean Grad:      8.0 mmHg LVOT Vmax:         148.00 cm/s LVOT Vmean:        105.000 cm/s LVOT VTI:          0.392 m LVOT/AV VTI ratio: 0.83  AORTA Ao Root diam: 2.80 cm Ao Asc diam:  2.20 cm  MITRAL VALVE  TRICUSPID VALVE MV Area (PHT): 3.63 cm     TR Peak grad:   41.0 mmHg MV Decel Time: 209 msec     TR Vmax:        320.00 cm/s MV E velocity: 116.00 cm/s MV A velocity: 97.70 cm/s   SHUNTS MV E/A ratio:  1.19         Systemic VTI:  0.39 m Systemic Diam: 1.90 cm  Wilbert Bihari MD Electronically signed by Wilbert Bihari MD Signature Date/Time: 09/12/2023/12:23:13 PM    Final    MONITORS  LONG TERM MONITOR (3-14 DAYS) 03/29/2024  Narrative Patch Wear Time:  13 days and 23 hours  HR 40 - 182, average 58 bpm. 3 nonsustained VT (longest 5 beats) No atrial fibrillation detected. Rare supraventricular ectopy. Rare ventricular ectopy. 975 supraventricular runs, longest 10 minutes 50 seconds at a rate of 91 bpm, fastest 182 bpm for 5 beats Symptom trigger episodes correspond to this rhythm with SVT  Will Gladis Norton Cardiac Electrophysiology       ______________________________________________________________________________________________     Recent  Labs: 06/10/2024: ALT 13; Hemoglobin 11.9; Platelets 193.0; TSH 3.64 06/13/2024: BUN 22; Creatinine, Ser 1.25; Magnesium  2.2; Potassium 4.6; Sodium 139  Recent Lipid Panel    Component Value Date/Time   CHOL 159 06/10/2024 1127   TRIG 96.0 06/10/2024 1127   HDL 47.30 06/10/2024 1127   CHOLHDL 3 06/10/2024 1127   VLDL 19.2 06/10/2024 1127   LDLCALC 92 06/10/2024 1127    History of Present Illness    78 year old female with the above past medical history including nonobstructive CAD, paroxysmal atrial fibrillation, mild carotid artery stenosis, hypertension, hyperlipidemia, renal oncocytoma s/p right nephrectomy, Crohn's disease, OSA and GERD.  She has a history of prior MI.  Cardiac catheterization in 02/2007 revealed nonobstructive plaque in the LAD, EF 55% at the time.  It was noted that her MI was likely secondary to first diagonal branch occlusion.  She has a history of paroxysmal atrial fibrillation, on chronic Coumadin .  Carotid Dopplers in 2021 revealed 1 to 30% B ICA stenosis.  Most recent echocardiogram in November 2024 showed EF 70 to 75%, normal LV function, no RWMA, mild concentric LVH, G2 DD, normal RV systolic function, mildly elevated PASP, aortic valve sclerosis without evidence of aortic stenosis.  Cardiac monitor in 03/2024 revealed predominantly normal sinus rhythm, 3 episodes of NSVT, longest lasting 5 beats, 975 runs of SVT, longest lasting 10 minutes and 50 seconds, rare PACs and PVCs, no evidence of atrial fibrillation.  She was last seen in the office on 07/24/2024 and noted mild dyspnea on exertion, minimal bilateral lower extremity edema.  She reported 1 episode of breakthrough atrial fibrillation.  BP was mildly elevated.  Valsartan  was increased to 240 mg daily.  She contacted our office on 08/26/2024 with concern for increased bilateral lower extremity edema.  She presents today for follow-up.  Since her last visit Accompanied by her husband.  Increased shortness of breath  with exertion, increasingly elevated BP.  SBP has been running in the 170s to 180s.  Despite increased valsartan .  She has 1+ pitting bilateral lower extremity edema, she denies chest pain, palpitations, dizziness, PND orthopnea.  She has had some sudden weight gain.  Reviewed with EP APP, Pharm.D., difficult situation given Tikosyn , allergies, current medications.  Will check CBC, BMET, magnesium .  She did have an episode of twisting in her chest on 08/19/2024.  Denies any recurrent symptoms.  Will have her take Lasix  40 mg daily  x 3 days.  Will add spironolactone 12.5 mg daily.  Will change metoprolol  to carvedilol 6.25 mg twice daily.  Continue to monitor BP and report SBP consistently greater than 150 mmHg, HR less than 50 bpm.  Will have her repeat magnesium , BMET in 3 days.  Will plan for close follow-up, she will see Dr. Barbaraann, DOD on 09/02/2024.  Will refer to Pharm.D. for hypertension clinic.  Follow-up in 2 to 3 months with Dr. Pietro.  Goal potassium greater than 4.0, magnesium  greater than 2.0.  Bilateral lower extremity edema/dyspnea on exertion: Nonobstructive CAD: Cardiac catheterization in 02/2007 revealed nonobstructive plaque in the LAD, EF 55% .  Paroxysmal atrial fibrillation: Cardiac monitor in 03/2024 revealed predominantly normal sinus rhythm, 3 episodes of NSVT, longest lasting 5 beats, 975 runs of SVT, longest lasting 10 minutes and 50 seconds, rare PACs and PVCs, no evidence of atrial fibrillation.  On Tikosyn , warfarin.  Carotid artery stenosis: Carotid Dopplers in 2021 revealed 1 to 30% B ICA stenosis.  Asymptomatic.  Consider repeat carotid ultrasound as clinically indicated.  Continue Hypertension: BP elevated in office today.  Hyperlipidemia: LDL was 92 in 05/2024.  Statin intolerant.  She has declined alternative lipid-lowering therapy. Disposition: Follow-up in     Wt Readings from Last 3 Encounters:  08/27/24 252 lb (114.3 kg)  06/13/24 240 lb 3.2 oz (109 kg)   06/11/24 238 lb (108 kg)    Home Medications    Current Outpatient Medications  Medication Sig Dispense Refill   amLODipine  (NORVASC ) 10 MG tablet TAKE 1 TABLET BY MOUTH EVERY DAY 90 tablet 3   clobetasol ointment (TEMOVATE) 0.05 % Apply 1 Application topically 2 (two) times daily. (Patient taking differently: Apply 1 Application topically as needed.)     dextrose 5 % SOLN 50 mL with methotrexate 25 MG/ML (PF) SOLN 40 mg/m2 Inject 40 mg/m2 into the vein once a week. Every wednesday     dofetilide  (TIKOSYN ) 125 MCG capsule TAKE 3 CAPSULES BY MOUTH TWICE A DAY 540 capsule 3   doxazosin  (CARDURA ) 2 MG tablet TAKE 1 TABLET BY MOUTH EVERY DAY 90 tablet 3   escitalopram  (LEXAPRO ) 10 MG tablet Take 1 tablet (10 mg total) by mouth daily. 90 tablet 1   esomeprazole  (NEXIUM ) 40 MG capsule Take 1 capsule (40 mg total) by mouth daily. Take in the morning before breakfast. 30 capsule 11   folic acid (FOLVITE) 1 MG tablet Take 2 mg by mouth daily. (Patient taking differently: Take 1 mg by mouth 2 (two) times daily.)     inFLIXimab  10 mg/kg in sodium chloride  0.9 % Inject 10 mg/kg into the vein every 6 (six) weeks.     loperamide (IMODIUM A-D) 2 MG tablet Take 1 mg by mouth as needed. Dihrrhea     metoprolol  tartrate (LOPRESSOR ) 25 MG tablet Take 0.5 tablets (12.5 mg total) by mouth 2 (two) times daily. 90 tablet 3   Multiple Vitamin (MULTIVITAMIN) capsule Take 1 capsule by mouth daily. 1 daily     NEOMYCIN -POLYMYXIN-HYDROCORTISONE  (CORTISPORIN) 1 % SOLN OTIC solution PLACE 3 DROPS INTO THE LEFT EAR 3 TIMES DAILY. 10 mL 0   nitroGLYCERIN  (NITROSTAT ) 0.4 MG SL tablet Place 1 tablet (0.4 mg total) under the tongue every 5 (five) minutes as needed for chest pain. Do not exceed 3 tabs in 15 minutes 75 tablet 3   Probiotic Product (PHILLIPS COLON HEALTH) CAPS Take 1 capsule by mouth daily. 1 per day     TUBERCULIN SYR 1CC/27GX1/2 27G X  1/2 1 ML MISC      valsartan  (DIOVAN ) 160 MG tablet TAKE 1 AND 1/2 TABLETS  ONCE DAILY (Patient taking differently: Take 160 mg by mouth 2 (two) times daily. TAKE 1 AND 1/2 TABLETS ONCE DAILY) 135 tablet 3   warfarin (COUMADIN ) 2.5 MG tablet TAKE 1/2 TABLET TO 1 TABLET BY MOUTH DAILY AS DIRECTED BY COUMADIN  CLINIC 75 tablet 1   hydrocortisone  (ANUSOL -HC) 25 MG suppository Place 1 suppository (25 mg total) rectally 2 (two) times daily as needed for hemorrhoids or anal itching. (Patient not taking: Reported on 08/27/2024) 12 suppository 1   valsartan  (DIOVAN ) 80 MG tablet Take 1 tablet (80 mg total) by mouth daily. (Patient not taking: Reported on 08/27/2024) 90 tablet 3   No current facility-administered medications for this visit.     Review of Systems    ***.  All other systems reviewed and are otherwise negative except as noted above.    Physical Exam    VS:  BP (!) 171/73 (BP Location: Left Arm, Patient Position: Sitting, Cuff Size: Large)   Pulse (!) 56   Ht 5' 3 (1.6 m)   Wt 252 lb (114.3 kg)   SpO2 94%   BMI 44.64 kg/m  , BMI Body mass index is 44.64 kg/m.     GEN: Well nourished, well developed, in no acute distress. HEENT: normal. Neck: Supple, no JVD, carotid bruits, or masses. Cardiac: RRR, no murmurs, rubs, or gallops. No clubbing, cyanosis, edema.  Radials/DP/PT 2+ and equal bilaterally.  Respiratory:  Respirations regular and unlabored, clear to auscultation bilaterally. GI: Soft, nontender, nondistended, BS + x 4. MS: no deformity or atrophy. Skin: warm and dry, no rash. Neuro:  Strength and sensation are intact. Psych: Normal affect.  Accessory Clinical Findings    ECG personally reviewed by me today - EKG Interpretation Date/Time:  Tuesday August 27 2024 13:42:27 EST Ventricular Rate:  56 PR Interval:  174 QRS Duration:  82 QT Interval:  460 QTC Calculation: 443 R Axis:   13  Text Interpretation: Sinus bradycardia When compared with ECG of 13-Jun-2024 14:40, No significant change was found Confirmed by Daneen Perkins ([id number]) on  08/27/2024 1:51:49 PM  - no acute changes.   Lab Results  Component Value Date   WBC 6.6 06/10/2024   HGB 11.9 (L) 06/10/2024   HCT 36.5 06/10/2024   MCV 98.5 06/10/2024   PLT 193.0 06/10/2024   Lab Results  Component Value Date   CREATININE 1.25 (H) 06/13/2024   BUN 22 06/13/2024   NA 139 06/13/2024   K 4.6 06/13/2024   CL 103 06/13/2024   CO2 20 06/13/2024   Lab Results  Component Value Date   ALT 13 06/10/2024   AST 18 06/10/2024   ALKPHOS 79 06/10/2024   BILITOT 0.5 06/10/2024   Lab Results  Component Value Date   CHOL 159 06/10/2024   HDL 47.30 06/10/2024   LDLCALC 92 06/10/2024   TRIG 96.0 06/10/2024   CHOLHDL 3 06/10/2024    Lab Results  Component Value Date   HGBA1C 5.8 06/10/2024    Assessment & Plan    1.  ***  The patient's 1st BP is elevated (>139/89)*** Repeat BP and {Click to enter a 2nd BP Refresh Note  :1}   Perkins JAYSON Daneen, NP 08/27/2024, 1:52 PM

## 2024-08-28 LAB — CBC
Hematocrit: 32.6 % — ABNORMAL LOW (ref 34.0–46.6)
Hemoglobin: 10.8 g/dL — ABNORMAL LOW (ref 11.1–15.9)
MCH: 32.9 pg (ref 26.6–33.0)
MCHC: 33.1 g/dL (ref 31.5–35.7)
MCV: 99 fL — ABNORMAL HIGH (ref 79–97)
Platelets: 172 x10E3/uL (ref 150–450)
RBC: 3.28 x10E6/uL — ABNORMAL LOW (ref 3.77–5.28)
RDW: 14.9 % (ref 11.7–15.4)
WBC: 7.9 x10E3/uL (ref 3.4–10.8)

## 2024-08-28 LAB — BASIC METABOLIC PANEL WITH GFR
BUN/Creatinine Ratio: 18 (ref 12–28)
BUN: 23 mg/dL (ref 8–27)
Creatinine, Ser: 1.3 mg/dL — AB (ref 0.57–1.00)
Glucose: 84 mg/dL (ref 70–99)
eGFR: 42 mL/min/1.73 — AB (ref >59)

## 2024-08-28 LAB — MAGNESIUM

## 2024-08-29 ENCOUNTER — Ambulatory Visit: Payer: Self-pay | Admitting: Nurse Practitioner

## 2024-08-30 ENCOUNTER — Encounter: Payer: Self-pay | Admitting: Nurse Practitioner

## 2024-08-30 DIAGNOSIS — I2510 Atherosclerotic heart disease of native coronary artery without angina pectoris: Secondary | ICD-10-CM | POA: Diagnosis not present

## 2024-08-30 DIAGNOSIS — I480 Paroxysmal atrial fibrillation: Secondary | ICD-10-CM | POA: Diagnosis not present

## 2024-08-30 DIAGNOSIS — I6523 Occlusion and stenosis of bilateral carotid arteries: Secondary | ICD-10-CM | POA: Diagnosis not present

## 2024-08-30 DIAGNOSIS — I10 Essential (primary) hypertension: Secondary | ICD-10-CM | POA: Diagnosis not present

## 2024-08-30 DIAGNOSIS — R600 Localized edema: Secondary | ICD-10-CM | POA: Diagnosis not present

## 2024-08-30 DIAGNOSIS — E785 Hyperlipidemia, unspecified: Secondary | ICD-10-CM | POA: Diagnosis not present

## 2024-08-31 LAB — BASIC METABOLIC PANEL WITH GFR
BUN/Creatinine Ratio: 14 (ref 12–28)
BUN: 20 mg/dL (ref 8–27)
CO2: 23 mmol/L (ref 20–29)
Calcium: 8.4 mg/dL — ABNORMAL LOW (ref 8.7–10.3)
Chloride: 105 mmol/L (ref 96–106)
Creatinine, Ser: 1.42 mg/dL — ABNORMAL HIGH (ref 0.57–1.00)
Glucose: 91 mg/dL (ref 70–99)
Potassium: 4.1 mmol/L (ref 3.5–5.2)
Sodium: 142 mmol/L (ref 134–144)
eGFR: 38 mL/min/1.73 — ABNORMAL LOW (ref >59)

## 2024-08-31 LAB — MAGNESIUM: Magnesium: 2.1 mg/dL (ref 1.6–2.3)

## 2024-09-01 NOTE — Progress Notes (Unsigned)
  Cardiology Office Note:  .   Date:  09/01/2024  ID:  [name], DOB [birth year], MRN [id number] PCP: Domenica Harlene LABOR, MD  [facility] Providers Cardiologist:  Redell Shallow, MD Electrophysiologist:  Will Gladis Norton, MD { Click to update primary MD,subspecialty MD or APP then REFRESH:1}   History of Present Illness: .   No chief complaint on file.   [name] is a 78 y.o. female with history of HFpEF, Afib, CAD, HTN, HLD, obesity who presents for follow-up. Seen 08/27/2024 for fluid retention. Lasix  increased.      Problem List Non-obstructive CAD HFpEF Paroxysmal Afib HTN HLD RCC s/p R nephrectomy Crohn's  OSA GERD    ROS: All other ROS reviewed and negative. Pertinent positives noted in the HPI.     Studies Reviewed: [name]       TTE 09/12/2023  1. Left ventricular ejection fraction, by estimation, is 70 to 75%. Left  ventricular ejection fraction by 3D volume is 73 %. The left ventricle has  hyperdynamic function. The left ventricle has no regional wall motion  abnormalities. There is mild  concentric left ventricular hypertrophy. Left ventricular diastolic  parameters are consistent with Grade II diastolic dysfunction  (pseudonormalization). Elevated left ventricular end-diastolic pressure.  The average left ventricular global longitudinal  strain is -17.4 %. The global longitudinal strain is normal.   2. Right ventricular systolic function is normal. The right ventricular  size is moderately enlarged. There is mildly elevated pulmonary artery  systolic pressure. The estimated right ventricular systolic pressure is  44.0 mmHg.   3. Left atrial size was severely dilated.   4. Right atrial size was mildly dilated.   5. The mitral valve is normal in structure. Trivial mitral valve  regurgitation. No evidence of mitral stenosis.   6. The aortic valve is tricuspid. Aortic valve regurgitation is not  visualized. Aortic valve sclerosis/calcification  is present, without any  evidence of aortic stenosis. Aortic valve area, by VTI measures 2.35 cm.  Aortic valve mean gradient measures  8.0 mmHg. Aortic valve Vmax measures 1.75 m/s.   7. The inferior vena cava is normal in size with greater than 50%  respiratory variability, suggesting right atrial pressure of 3 mmHg.  Physical Exam:   VS:  There were no vitals taken for this visit.   Wt Readings from Last 3 Encounters:  08/27/24 252 lb (114.3 kg)  06/13/24 240 lb 3.2 oz (109 kg)  06/11/24 238 lb (108 kg)    GEN: Well nourished, well developed in no acute distress NECK: No JVD; No carotid bruits CARDIAC: ***RRR, no murmurs, rubs, gallops RESPIRATORY:  Clear to auscultation without rales, wheezing or rhonchi  ABDOMEN: Soft, non-tender, non-distended EXTREMITIES:  No edema; No deformity  ASSESSMENT AND PLAN: .   ***    {Are you ordering a CV Procedure (e.g. stress test, cath, DCCV, TEE, etc)?   Press F2        :[id number]}   Follow-up: No follow-ups on file.  Signed, Darryle DASEN. Barbaraann, MD, [facility]  [facility]  [address] [place], [place] [address] [phone]  8:27 PM

## 2024-09-02 ENCOUNTER — Encounter: Payer: Self-pay | Admitting: Cardiovascular Disease

## 2024-09-02 ENCOUNTER — Ambulatory Visit: Attending: Cardiovascular Disease | Admitting: Cardiovascular Disease

## 2024-09-02 VITALS — BP 188/64 | HR 56 | Ht 63.0 in | Wt 247.0 lb

## 2024-09-02 DIAGNOSIS — R2689 Other abnormalities of gait and mobility: Secondary | ICD-10-CM | POA: Diagnosis not present

## 2024-09-02 DIAGNOSIS — I5032 Chronic diastolic (congestive) heart failure: Secondary | ICD-10-CM | POA: Insufficient documentation

## 2024-09-02 DIAGNOSIS — R0602 Shortness of breath: Secondary | ICD-10-CM | POA: Insufficient documentation

## 2024-09-02 DIAGNOSIS — I10 Essential (primary) hypertension: Secondary | ICD-10-CM | POA: Diagnosis not present

## 2024-09-02 MED ORDER — HYDRALAZINE HCL 50 MG PO TABS: 50.0000 mg | ORAL_TABLET | Freq: Three times a day (TID) | ORAL | 3 refills | Status: DC

## 2024-09-02 NOTE — Patient Instructions (Addendum)
 Medication Instructions:  Start Hydralazine  50 mg take one tablet three times daily *If you need a refill on your cardiac medications before your next appointment, please call your pharmacy*  Lab Work: Today- BMET, Magnesium  If you have labs (blood work) drawn today and your tests are completely normal, you will receive your results only by: MyChart Message (if you have MyChart) OR A paper copy in the mail If you have any lab test that is abnormal or we need to change your treatment, we will call you to review the results.  Testing/Procedures: Your physician has requested that you have an echocardiogram. Echocardiography is a painless test that uses sound waves to create images of your heart. It provides your doctor with information about the size and shape of your heart and how well your heart's chambers and valves are working. This procedure takes approximately one hour. There are no restrictions for this procedure. Please do NOT wear cologne, perfume, aftershave, or lotions (deodorant is allowed). Please arrive 15 minutes prior to your appointment time.  Please note: We ask at that you not bring children with you during ultrasound (echo/ vascular) testing. Due to room size and safety concerns, children are not allowed in the ultrasound rooms during exams. Our front office staff cannot provide observation of children in our lobby area while testing is being conducted. An adult accompanying a patient to their appointment will only be allowed in the ultrasound room at the discretion of the ultrasound technician under special circumstances. We apologize for any inconvenience.   Follow-Up: At [facility], you and your health needs are our priority.  As part of our continuing mission to provide you with exceptional heart care, our providers are all part of one team.  This team includes your primary Cardiologist (physician) and Advanced Practice Providers or APPs (Physician Assistants and  Nurse Practitioners) who all work together to provide you with the care you need, when you need it.  Your next appointment:   2 week(s)  Provider:   Damien Braver, NP          We recommend signing up for the patient portal called MyChart.  Sign up information is provided on this After Visit Summary.  MyChart is used to connect with patients for Virtual Visits (Telemedicine).  Patients are able to view lab/test results, encounter notes, upcoming appointments, etc.  Non-urgent messages can be sent to your provider as well.   To learn more about what you can do with MyChart, go to [email or web address].   Other Instructions Please check your blood pressure daily for the next two weeks and bring readings to your next appointment  Referral sent to Home Health

## 2024-09-03 ENCOUNTER — Ambulatory Visit: Payer: Self-pay | Admitting: Cardiovascular Disease

## 2024-09-03 ENCOUNTER — Other Ambulatory Visit: Payer: Self-pay | Admitting: Cardiovascular Disease

## 2024-09-03 LAB — BASIC METABOLIC PANEL WITH GFR
BUN/Creatinine Ratio: 13 (ref 12–28)
BUN: 16 mg/dL (ref 8–27)
CO2: 22 mmol/L (ref 20–29)
Calcium: 8.4 mg/dL — ABNORMAL LOW (ref 8.7–10.3)
Chloride: 105 mmol/L (ref 96–106)
Creatinine, Ser: 1.21 mg/dL — ABNORMAL HIGH (ref 0.57–1.00)
Glucose: 81 mg/dL (ref 70–99)
Potassium: 4.1 mmol/L (ref 3.5–5.2)
Sodium: 142 mmol/L (ref 134–144)
eGFR: 46 mL/min/1.73 — ABNORMAL LOW (ref >59)

## 2024-09-03 LAB — MAGNESIUM: Magnesium: 2.2 mg/dL (ref 1.6–2.3)

## 2024-09-03 MED ORDER — ISOSORBIDE MONONITRATE ER 30 MG PO TB24
30.0000 mg | ORAL_TABLET | Freq: Every day | ORAL | 3 refills | Status: DC
  Filled 2024-09-03: qty 90, 90d supply, fill #0

## 2024-09-03 NOTE — Progress Notes (Signed)
 Hydralazine  stopped due to history of GI intolerance. We will try imdur 30 mg daily to help with BP instead.   Signed, Darryle DASEN. Barbaraann, MD, [facility]  [facility]  [address] [place], [place] [address] [phone]  9:47 PM

## 2024-09-04 ENCOUNTER — Other Ambulatory Visit (HOSPITAL_COMMUNITY): Payer: Self-pay

## 2024-09-05 ENCOUNTER — Encounter: Payer: Self-pay | Admitting: Cardiology

## 2024-09-09 ENCOUNTER — Encounter (HOSPITAL_BASED_OUTPATIENT_CLINIC_OR_DEPARTMENT_OTHER): Payer: Self-pay

## 2024-09-09 ENCOUNTER — Ambulatory Visit (HOSPITAL_BASED_OUTPATIENT_CLINIC_OR_DEPARTMENT_OTHER): Admission: EM | Admit: 2024-09-09 | Discharge: 2024-09-09 | Disposition: A | Discharge status: Home / Self Care

## 2024-09-09 ENCOUNTER — Other Ambulatory Visit (HOSPITAL_COMMUNITY): Payer: Self-pay

## 2024-09-09 DIAGNOSIS — I10 Essential (primary) hypertension: Secondary | ICD-10-CM

## 2024-09-09 DIAGNOSIS — R0602 Shortness of breath: Secondary | ICD-10-CM

## 2024-09-09 NOTE — Discharge Instructions (Signed)
 I would recommend not taking the Imdur anymore due to side effects.  This is typically used for angina or chronic chest pain. May not be the best fit for lowering your blood pressure. Your cardiologist had prescribed some clonidine to lower your blood pressure.  This may be a better fit for you for lowering your systolic blood pressure. Continue to monitor your pressures at home.  If your symptoms worsen you will need to go to the ER otherwise please follow-up with cardiologist.

## 2024-09-09 NOTE — ED Provider Notes (Addendum)
 [name]    CSN: [id number] Arrival date & time: 09/09/24  1138      History   Chief Complaint Chief Complaint  Patient presents with   Shortness of Breath   Hypertension    HPI [name] is a 78 y.o. female.   Pt is a 78 year old female that presents with elevated blood pressure readings, shortness of breath, fluid retention.  These are all chronic issues.  She is currently under going [name] with a cardiologist to help stabilize her blood pressure and other chronic issues. Current blood pressure medication dosage is Valsartan  160mg  bid.  Early November, she was prescribed Lasix  and potassium. States this helped but once stopped, the fluid built back up again. Patient presents for concerns regarding a new blood pressure medication, Imdur. She took this today.  She is not currently having any chest pain.   Shortness of Breath Hypertension Associated symptoms include shortness of breath.    Past Medical History:  Diagnosis Date   Allergy    Anxiety    Arthritis    Atrial fibrillation (HCC) 2008   Blood transfusion without reported diagnosis    CAD (coronary artery disease)    Crohn's colitis (HCC)    she reports ulcerative colitis beginning in her 23's, biopsies 2008 suggest Crohn's not UC   Depression    Diverticulosis    GERD (gastroesophageal reflux disease)    Hyperlipidemia    Hypertension    IBS (irritable bowel syndrome)    Keloid of skin    Obesity    OSA (obstructive sleep apnea)    Pancreatitis    Paroxysmal atrial fibrillation (HCC)    Pseudoaneurysm of right femoral artery    Renal oncocytoma    s/p right nephrectomy   Rheumatoid arthritis (HCC) 05/29/2017   Sleep apnea    UC (ulcerative colitis) [facility]     Patient Active Problem List   Diagnosis Date Noted   Shoulder pain 06/09/2024   Anemia 06/09/2024   Morbid obesity (HCC) 09/25/2023   Elevated troponin 09/12/2023   Chest pain 09/11/2023   Acute midline thoracic back pain  09/07/2023   Other specified disorders of kidney and ureter 09/07/2023   COVID-19 08/28/2023   Headache 04/25/2023   Abnormal gait 08/18/2021   Crohn's disease (HCC) 08/18/2021   History of malignant neoplasm of skin 08/18/2021   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   Uveitis 08/18/2021   Psoriasis 08/04/2021   Vitamin D  deficiency 08/04/2021   Blepharitis 11/17/2019   Hip pain, chronic, left 12/03/2018   Dry eyes 08/05/2018   Onychomycosis 06/03/2018   Renal oncocytoma    Pseudoaneurysm of right femoral artery    Paroxysmal atrial fibrillation (HCC)    Pancreatitis    Hypertension    GERD (gastroesophageal reflux disease)    CAD (coronary artery disease)    Arthritis    Anxiety    Rheumatoid arthritis (HCC) 05/29/2017   SCC (squamous cell carcinoma), 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   Dry mouth 08/22/2016   Cerebrovascular disease 07/30/2014   Sinusitis 06/30/2014   Encounter for therapeutic drug monitoring 06/09/2014   Pain in joint of right shoulder 04/21/2014   Other acne 04/21/2014   Intertrigo 11/12/2013   Keloid of skin    Bilateral leg edema 09/12/2012   IBS (irritable bowel syndrome) 08/21/2012   Medicare annual wellness visit, subsequent 08/17/2012   Radicular low back pain 06/28/2012   Basal cell carcinoma 02/07/2012   Maxillary sinus cyst 02/07/2012   Statin  intolerance 02/28/2011   Crohn's colitis (HCC)    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   CAROTID BRUIT 12/10/2009   Hyperglycemia 07/02/2009   Headache 02/24/2009   Afib (HCC) 01/15/2009   Low back pain 01/07/2009   Obesity 01/03/2008   OSA (obstructive sleep apnea) 01/03/2008   Osteoporosis 11/05/2007   Hyperlipidemia 09/06/2007   DEPRESSION/ANXIETY 09/06/2007   Hearing loss 09/06/2007   Essential hypertension 09/06/2007   MYOCARDIAL INFARCTION, HX OF 09/06/2007   Coronary atherosclerosis 09/06/2007   Allergic rhinitis 09/06/2007   History of colonic polyps 07/04/2007    Past  Surgical History:  Procedure Laterality Date   ANGIOPLASTY     BREAST BIOPSY  10/24/1994   benign lesion    Cataract surgery Left 11/13/2014   CHOLECYSTECTOMY  10/24/2006   COLONOSCOPY W/ BIOPSIES     CORONARY ANGIOPLASTY     ESOPHAGOGASTRODUODENOSCOPY     EYE SURGERY     NEPHRECTOMY  10/24/2006   right secondary to oncocytoma    RIGHT FEMORAL  PSEUDOANEURYSM & RIGHT GROIN HEMATOMA EVACUATION  02/22/2007   RIGHT RENAL ARTERY REPAIR  10/24/1974   TUBAL LIGATION      OB History   No obstetric history on file.      Home Medications    Prior to Admission medications   Medication Sig Start Date End Date Taking? Authorizing Provider  amLODipine  (NORVASC ) 10 MG tablet TAKE 1 TABLET BY MOUTH EVERY DAY 12/22/23   Carlin Delon BROCKS, NP  carvedilol (COREG) 6.25 MG tablet Take 1 tablet (6.25 mg total) by mouth 2 (two) times daily. 08/27/24   Daneen Damien BROCKS, NP  clobetasol ointment (TEMOVATE) 0.05 % Apply 1 Application topically 2 (two) times daily. Patient taking differently: Apply 1 Application topically as needed. 11/24/23   [provider]  dextrose 5 % SOLN 50 mL with methotrexate 25 MG/ML (PF) SOLN 40 mg/m2 Inject 40 mg/m2 into the vein once a week. Every wednesday    [provider]  dofetilide  (TIKOSYN ) 125 MCG capsule TAKE 3 CAPSULES BY MOUTH TWICE A DAY 11/20/23   Pietro Redell RAMAN, MD  doxazosin  (CARDURA ) 2 MG tablet TAKE 1 TABLET BY MOUTH EVERY DAY 12/22/23   Carlin Delon BROCKS, NP  escitalopram  (LEXAPRO ) 10 MG tablet Take 1 tablet (10 mg total) by mouth daily. 08/02/24   Domenica Harlene LABOR, MD  esomeprazole  (NEXIUM ) 40 MG capsule Take 1 capsule (40 mg total) by mouth daily. Take in the morning before breakfast. 01/15/18   Esterwood, Amy S, PA-C  folic acid (FOLVITE) 1 MG tablet Take 2 mg by mouth daily. Patient taking differently: Take 1 mg by mouth 2 (two) times daily. 09/25/19   [provider]  furosemide  (LASIX ) 40 MG tablet Take 1 tablet for 3 days and hold  08/27/24   Daneen Damien BROCKS, NP  hydrocortisone  (ANUSOL -HC) 25 MG suppository Place 1 suppository (25 mg total) rectally 2 (two) times daily as needed for hemorrhoids or anal itching. 04/17/24   Avram Lupita BRAVO, MD  inFLIXimab  10 mg/kg in sodium chloride  0.9 % Inject 10 mg/kg into the vein every 6 (six) weeks.    [provider]  isosorbide mononitrate (IMDUR) 30 MG 24 hr tablet Take 1 tablet (30 mg total) by mouth daily. 09/03/24 12/08/24  O'NealDarryle Ned, MD  loperamide (IMODIUM A-D) 2 MG tablet Take 1 mg by mouth as needed. Dihrrhea    [provider]  Multiple Vitamin (MULTIVITAMIN) capsule Take 1 capsule by mouth daily. 1  daily    [provider]  NEOMYCIN -POLYMYXIN-HYDROCORTISONE  (CORTISPORIN) 1 % SOLN OTIC solution PLACE 3 DROPS INTO THE LEFT EAR 3 TIMES DAILY. 08/02/24   Webb, Padonda B, FNP  nitroGLYCERIN  (NITROSTAT ) 0.4 MG SL tablet Place 1 tablet (0.4 mg total) under the tongue every 5 (five) minutes as needed for chest pain. Do not exceed 3 tabs in 15 minutes 08/20/18   Pietro Redell RAMAN, MD  Probiotic Product [facility] COLON HEALTH) CAPS Take 1 capsule by mouth daily. 1 per day    [provider]  spironolactone (ALDACTONE) 25 MG tablet Take 0.5 tablets (12.5 mg total) by mouth daily. 08/27/24   Daneen Damien BROCKS, NP  TUBERCULIN SYR 1CC/27GX1/2 27G X 1/2 1 ML MISC     [provider]  valsartan  (DIOVAN ) 160 MG tablet TAKE 1 AND 1/2 TABLETS ONCE DAILY Patient taking differently: Take 160 mg by mouth 2 (two) times daily. TAKE 1 AND 1/2 TABLETS ONCE DAILY 07/24/24   Pietro Redell RAMAN, MD  valsartan  (DIOVAN ) 80 MG tablet Take 1 tablet (80 mg total) by mouth daily. 07/31/24   Pietro Redell RAMAN, MD  warfarin (COUMADIN ) 2.5 MG tablet TAKE 1/2 TABLET TO 1 TABLET BY MOUTH DAILY AS DIRECTED BY COUMADIN  CLINIC 07/05/24   Pietro Redell RAMAN, MD    Family History Family History  Problem Relation Age of Onset   Hypertension Mother    Coronary artery disease  Mother    Stroke Mother    Clotting disorder Mother    Heart disease Mother    Allergies Mother    Colitis Father    Heart disease Father    Clotting disorder Sister    Stroke Brother    Clotting disorder Brother    Cancer Daughter    Asthma Daughter    Asthma Maternal Uncle    Colon cancer Paternal Aunt    Cancer Daughter    Colon polyps Neg Hx    Kidney disease Neg Hx    Stomach cancer Neg Hx    Esophageal cancer Neg Hx     Social History Social History   Tobacco Use   Smoking status: Never   Smokeless tobacco: Never   Tobacco comments:    Never smoked 03/06/24  Vaping Use   Vaping status: Never Used  Substance Use Topics   Alcohol use: No   Drug use: No     Allergies   Hydralazine , Cefdinir , Codeine, Fosamax [alendronate sodium], Gluten meal, Guaifenesin er, Latex, Percocet [oxycodone -acetaminophen ], and Psyllium   Review of Systems Review of Systems  Respiratory:  Positive for shortness of breath.      Physical Exam Triage Vital Signs ED Triage Vitals  Encounter Vitals Group     BP 09/09/24 1236 (!) 184/79     Girls Systolic BP Percentile --      Girls Diastolic BP Percentile --      Boys Systolic BP Percentile --      Boys Diastolic BP Percentile --      Pulse Rate 09/09/24 1236 63     Resp 09/09/24 1236 20     Temp 09/09/24 1236 97.8 F (36.6 C)     Temp Source 09/09/24 1236 Oral     SpO2 09/09/24 1236 97 %     Weight --      Height --      Head Circumference --      Peak Flow --      Pain Score 09/09/24 1240 0  Pain Loc --      Pain Education --      Exclude from Growth Chart --    No data found.  Updated Vital Signs BP (!) 177/72 (BP Location: Left Arm)   Pulse 61   Temp 97.8 F (36.6 C) (Oral)   Resp 20   SpO2 97%   Visual Acuity Right Eye Distance:   Left Eye Distance:   Bilateral Distance:    Right Eye Near:   Left Eye Near:    Bilateral Near:     Physical Exam Vitals and nursing note reviewed.  Constitutional:       General: She is not in acute distress.    Appearance: Normal appearance. She is well-developed. She is not ill-appearing, toxic-appearing or diaphoretic.  Cardiovascular:     Rate and Rhythm: Normal rate and regular rhythm.  Pulmonary:     Effort: Pulmonary effort is normal.     Breath sounds: Normal breath sounds.  Musculoskeletal:     Right lower leg: Edema present.     Left lower leg: Edema present.     Comments: 1+ pitting edema with generalized papular, erythematous rash to bilateral lower extremities  Skin:    General: Skin is warm and dry.  Neurological:     Mental Status: She is alert.  Psychiatric:        Mood and Affect: Mood normal.      UC Treatments / Results  Labs (all labs ordered are listed, but only abnormal results are displayed) Labs Reviewed - No data to display  EKG   Radiology No results found.  Procedures Procedures (including critical [name] time)  Medications Ordered in UC Medications - No data to display  Initial Impression / Assessment and Plan / UC Course  I have reviewed the triage vital signs and the nursing notes.  Pertinent labs & imaging results that were available during my [name] of the patient were reviewed by me and considered in my medical decision making (see chart for details).     Essential hypertension and shortness of breath-patient with chronic hypertension.  She currently has cardiology overseeing her [name] for this.  She is on valsartan  and has had dose increase multiple times due to elevated blood pressure.  She was previously on Lasix  for fluid retention but is not currently.  She was prescribed hydralazine  for continued elevated blood pressure but unable to take this due to allergy.  She was then prescribed Imdur by 1 cardiologist and clonidine by another .  She picked up the prescription for Imdur and took this this morning.  She is not currently having any chest pain. Sts the medication makes her feel a little weird.   Blood pressure still elevated here today.  Recommended that instead of taking the Imdur she may want think about doing the clonidine instead.  She plans to not take the Imdur tomorrow and try the clonidine to see if this will decrease her blood pressure.  Recommend to keep a close eye on her blood pressure and monitor it frequently.  If her symptoms worsen she will need to go to the ER otherwise she needs to follow-up with her cardiologist as planned. Final Clinical Impressions(s) / UC Diagnoses   Final diagnoses:  Essential hypertension  SOB (shortness of breath)     Discharge Instructions      I would recommend not taking the Imdur anymore due to side effects.  This is typically used for angina or chronic chest pain. May  not be the best fit for lowering your blood pressure. Your cardiologist had prescribed some clonidine to lower your blood pressure.  This may be a better fit for you for lowering your systolic blood pressure. Continue to monitor your pressures at home.  If your symptoms worsen you will need to go to the ER otherwise please follow-up with cardiologist.     ED Prescriptions   None    PDMP not reviewed this encounter.   Adah Wilbert LABOR, FNP 09/09/24 1630    Adah Wilbert LABOR, FNP 09/09/24 1631

## 2024-09-09 NOTE — ED Triage Notes (Signed)
 Patient reports has been having adjustments with her blood pressure medications due to hypertension and shortness of breath. Having swelling of extremitites. Current blood pressure medication dosage is Valsartan  160mg  bid.  Early November, she was prescribed Lasix  and potassium. States this helped but once stopped, the fluid built back up again. Patient presents for concerns regarding a new blood pressure medication, Imdur. Brought bottle in with her. Asked if she should take it. Encouraged patient to take medication as prescribed.

## 2024-09-10 ENCOUNTER — Ambulatory Visit: Payer: Self-pay

## 2024-09-10 ENCOUNTER — Encounter: Payer: Self-pay | Admitting: Student

## 2024-09-10 ENCOUNTER — Ambulatory Visit (INDEPENDENT_AMBULATORY_CARE_PROVIDER_SITE_OTHER): Admitting: Student

## 2024-09-10 ENCOUNTER — Encounter: Payer: Self-pay | Admitting: Family Medicine

## 2024-09-10 VITALS — BP 173/67 | HR 71 | Ht 63.0 in | Wt 246.2 lb

## 2024-09-10 DIAGNOSIS — I1A0 Resistant hypertension: Secondary | ICD-10-CM

## 2024-09-10 DIAGNOSIS — Z905 Acquired absence of kidney: Secondary | ICD-10-CM | POA: Diagnosis not present

## 2024-09-10 DIAGNOSIS — I129 Hypertensive chronic kidney disease with stage 1 through stage 4 chronic kidney disease, or unspecified chronic kidney disease: Secondary | ICD-10-CM | POA: Diagnosis not present

## 2024-09-10 DIAGNOSIS — I10 Essential (primary) hypertension: Secondary | ICD-10-CM | POA: Diagnosis not present

## 2024-09-10 DIAGNOSIS — I159 Secondary hypertension, unspecified: Secondary | ICD-10-CM

## 2024-09-10 MED ORDER — CLONIDINE HCL 0.1 MG PO TABS: 0.1000 mg | ORAL_TABLET | Freq: Two times a day (BID) | ORAL | 2 refills | Status: DC

## 2024-09-10 NOTE — Assessment & Plan Note (Signed)
 H/o right nephrectomy in 2008. Refer to nephrology for further evaluation.

## 2024-09-10 NOTE — Patient Instructions (Signed)
 [facility], contact us  at [phone]

## 2024-09-10 NOTE — Assessment & Plan Note (Addendum)
 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 despite multiple antihypertensives in use.  -Concern for possible RAS; Renal US  ordered - Rx- start clonidine 0.1 mg BID - Referred to hypertension clinic for further evaluation -If your SBP remains above 180, or if your symptoms persist or worsen, please seek immediate care.

## 2024-09-10 NOTE — Telephone Encounter (Signed)
 FYI Only or Action Required?: FYI only for provider: appointment scheduled on 11/18.  Patient was last seen in primary care on 06/10/2024 by Domenica Harlene LABOR, MD.  Called Nurse Triage reporting Shortness of Breath.  Symptoms began several weeks ago.  Interventions attempted: Rest, hydration, or home remedies.  Symptoms are: gradually worsening.  Triage Disposition: See HCP Within 4 Hours (Or PCP Triage)  Patient/caregiver understands and will follow disposition?: Yes  Copied from CRM #[id number]. Topic: Clinical - Red Word Triage >> Sep 10, 2024  1:11 PM Rea ORN wrote: Red Word that prompted transfer to Nurse Triage: SOB and High BP Reason for Disposition  [1] MILD difficulty breathing (e.g., minimal/no SOB at rest, SOB with walking, pulse < 100) AND [2] NEW-onset or WORSE than normal  Answer Assessment - Initial Assessment Questions Patient with several weeks of mild-mod SOB and elevated BP. She denies respiratory illness. Says she has to breath faster some times to get enough air. At rest sometimes but worse with activity.  Seen in UC yesterday for SOB and HTN- advised not to take her Imdur and O2 was fine Had leg swelling the beginning on the month and took lasix  for 3 days- helped swelling but didn't notice any breathing improvement.  Pt wanting Nephrology referral since Cardiology has not been able to get her BP under control. Appt made with PCP office today 11/18 and UC/ED precautions advised.   1. RESPIRATORY STATUS: Describe your breathing? (e.g., wheezing, shortness of breath, unable to speak, severe coughing)      Short of breath  2. ONSET: When did this breathing problem begin?      For several weeks  3. PATTERN Does the difficult breathing come and go, or has it been constant since it started?      Worse with activity  4. SEVERITY: How bad is your breathing? (e.g., mild, moderate, severe)      Mild at rest  5. RECURRENT SYMPTOM: Have you had difficulty breathing  before? If Yes, ask: When was the last time? and What happened that time?      denies 6. CARDIAC HISTORY: Do you have any history of heart disease? (e.g., heart attack, angina, bypass surgery, angioplasty)      MI, HTN 7. LUNG HISTORY: Do you have any history of lung disease?  (e.g., pulmonary embolus, asthma, emphysema)     OSA 8. CAUSE: What do you think is causing the breathing problem?      unsure 9. OTHER SYMPTOMS: Do you have any other symptoms? (e.g., chest pain, cough, dizziness, fever, runny nose)     Elevated BP  10. O2 SATURATION MONITOR:  Do you use an oxygen saturation monitor (pulse oximeter) at home? If Yes, ask: What is your reading (oxygen level) today? What is your usual oxygen saturation reading? (e.g., 95%)       Unsure  Answer Assessment - Initial Assessment Questions Diastolic increased with IMDUR- UC advised not to take anymore. UC yesterday for SOB and HTN  1. BLOOD PRESSURE: What is your blood pressure? Did you take at least two measurements 5 minutes apart?     176/90 at 0630 2. ONSET: When did you take your blood pressure?     Ongoing  3. HOW: How did you take your blood pressure? (e.g., automatic home BP monitor, visiting nurse)     Arm cuff 4. HISTORY: Do you have a history of high blood pressure?     yes 5. MEDICINES: Are you taking any medicines  for blood pressure? Have you missed any doses recently?     Imdur, valsartan  6. OTHER SYMPTOMS: Do you have any symptoms? (e.g., blurred vision, chest pain, difficulty breathing, headache, weakness)     SOB  Protocols used: Breathing Difficulty-A-AH, Blood Pressure - High-A-AH

## 2024-09-10 NOTE — Progress Notes (Signed)
 [facility]  Acute Office Visit  Subjective:     Patient ID: [name], female    DOB: October 13, 1946, 78 y.o.   MRN: [id number]  No chief complaint on file.   HPI    History of Present Illness [name] is a 78 year old female with hypertension who presents with elevated blood pressure despite multiple medications.   PMHX-HFpEF, Afib, CAD, HTN, HLD, OSA, Hx of right nephrectomy  Her blood pressure remains elevated in the 170s-180s SBP, despite multiple antihypertensive medications, including spironolactone, carvedilol, Lasix , valsartan , Cardura , and amlodipine .  She reports compliance with medications.  She was recently put on hydralazine  by cardiology, however she has discontinued this medication reporting side effects including nausea and diarrhea from hydralazine . 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 FH positive for renal artery issues, with her grandson requiring surgery for a RAS.No known lung disease history aside from OSA.  HTN- amlodipine  10 mg daily, carvedilol 6.25 mg BID, Valsartan  320 mg daily, aldactone 12.5 mg daily,   Patient denies fever, chills,  CP, palpitations, edema, HA, vision changes, N/V/D, abdominal pain, urinary symptoms, rash, weight changes, and recent illness or hospitalizations.   ROS See HPI     Objective:    BP (!) 173/67   Pulse 71   Ht 5' 3 (1.6 m)   Wt 246 lb 3.2 oz (111.7 kg)   SpO2 93%   BMI 43.61 kg/m    Physical Exam  General: No acute distress. Awake and conversant. +obese Eyes: Normal conjunctiva, anicteric. Round symmetric pupils.  Respiratory: CTAB. Respirations are non-labored. No wheezing.  Skin: Warm. No rashes or ulcers.  Psych: Alert and oriented. Cooperative, Appropriate mood and affect, Normal judgment.  CV: RRR. No murmur.  MSK: Normal ambulation. Ambulates with 4 wheel walker. No clubbing or cyanosis.  Neuro:  CN  II-XII grossly normal.    No results found for any visits on 09/10/24.      Assessment & Plan:   Problem List Items Addressed This Visit     H/O right nephrectomy   H/o right nephrectomy in 2008. Refer to nephrology for further evaluation.      Relevant Orders   Ambulatory referral to Nephrology   US  Renal Artery Stenosis   Hypertension   Relevant Medications   cloNIDine (CATAPRES) 0.1 MG tablet   Other Relevant Orders   US  Renal Artery Stenosis   US  Renal Artery Stenosis   Uncontrolled hypertension - Primary   Resistant hypertension with SBP 170s-180s. despite multiple antihypertensives in use.  -Concern for possible RAS; Renal US  ordered - Rx- start clonidine 0.1 mg BID - Referred to hypertension clinic for further evaluation -If your SBP remains above 180, or if your symptoms persist or worsen, please seek immediate care.       Relevant Medications   cloNIDine (CATAPRES) 0.1 MG tablet   Other Relevant Orders   Ambulatory referral to [facility]   US  Renal Artery Stenosis   Other Visit Diagnoses       Hypertensive chronic kidney disease with stage 1 through stage 4 chronic kidney disease, or unspecified chronic kidney disease       Relevant Orders   US  Renal Artery Stenosis        Meds ordered this encounter  Medications   cloNIDine (CATAPRES) 0.1 MG tablet    Sig: Take 1 tablet (0.1 mg total) by mouth  2 (two) times daily.    Dispense:  60 tablet    Refill:  2    Supervising Provider:   DOMENICA BLACKBIRD A [[id number]]    No follow-ups on file.  [name] Boruff L Chidubem Chaires, NP

## 2024-09-10 NOTE — Telephone Encounter (Signed)
 Appt scheduled

## 2024-09-12 ENCOUNTER — Other Ambulatory Visit: Payer: Self-pay

## 2024-09-12 ENCOUNTER — Encounter (HOSPITAL_BASED_OUTPATIENT_CLINIC_OR_DEPARTMENT_OTHER): Payer: Self-pay

## 2024-09-12 ENCOUNTER — Inpatient Hospital Stay (HOSPITAL_BASED_OUTPATIENT_CLINIC_OR_DEPARTMENT_OTHER)
Admission: EM | Admit: 2024-09-12 | Discharge: 2024-09-18 | DRG: 291 | Disposition: A | Discharge status: Home / Self Care | Attending: Internal Medicine | Admitting: Internal Medicine

## 2024-09-12 ENCOUNTER — Emergency Department (HOSPITAL_BASED_OUTPATIENT_CLINIC_OR_DEPARTMENT_OTHER)

## 2024-09-12 DIAGNOSIS — J9811 Atelectasis: Secondary | ICD-10-CM | POA: Diagnosis present

## 2024-09-12 DIAGNOSIS — Z8249 Family history of ischemic heart disease and other diseases of the circulatory system: Secondary | ICD-10-CM

## 2024-09-12 DIAGNOSIS — E66813 Obesity, class 3: Secondary | ICD-10-CM | POA: Diagnosis present

## 2024-09-12 DIAGNOSIS — F341 Dysthymic disorder: Secondary | ICD-10-CM | POA: Diagnosis present

## 2024-09-12 DIAGNOSIS — Z825 Family history of asthma and other chronic lower respiratory diseases: Secondary | ICD-10-CM

## 2024-09-12 DIAGNOSIS — N179 Acute kidney failure, unspecified: Secondary | ICD-10-CM | POA: Diagnosis not present

## 2024-09-12 DIAGNOSIS — Z6841 Body Mass Index (BMI) 40.0 and over, adult: Secondary | ICD-10-CM | POA: Diagnosis not present

## 2024-09-12 DIAGNOSIS — I4821 Permanent atrial fibrillation: Secondary | ICD-10-CM | POA: Diagnosis present

## 2024-09-12 DIAGNOSIS — Z7901 Long term (current) use of anticoagulants: Secondary | ICD-10-CM

## 2024-09-12 DIAGNOSIS — I10 Essential (primary) hypertension: Secondary | ICD-10-CM | POA: Diagnosis present

## 2024-09-12 DIAGNOSIS — M069 Rheumatoid arthritis, unspecified: Secondary | ICD-10-CM | POA: Diagnosis present

## 2024-09-12 DIAGNOSIS — D649 Anemia, unspecified: Secondary | ICD-10-CM | POA: Diagnosis present

## 2024-09-12 DIAGNOSIS — Z823 Family history of stroke: Secondary | ICD-10-CM

## 2024-09-12 DIAGNOSIS — A0472 Enterocolitis due to Clostridium difficile, not specified as recurrent: Secondary | ICD-10-CM | POA: Diagnosis not present

## 2024-09-12 DIAGNOSIS — R0602 Shortness of breath: Secondary | ICD-10-CM | POA: Diagnosis not present

## 2024-09-12 DIAGNOSIS — D631 Anemia in chronic kidney disease: Secondary | ICD-10-CM | POA: Diagnosis present

## 2024-09-12 DIAGNOSIS — R7989 Other specified abnormal findings of blood chemistry: Secondary | ICD-10-CM | POA: Diagnosis not present

## 2024-09-12 DIAGNOSIS — K648 Other hemorrhoids: Secondary | ICD-10-CM | POA: Diagnosis present

## 2024-09-12 DIAGNOSIS — Z79899 Other long term (current) drug therapy: Secondary | ICD-10-CM

## 2024-09-12 DIAGNOSIS — I5030 Unspecified diastolic (congestive) heart failure: Secondary | ICD-10-CM | POA: Diagnosis present

## 2024-09-12 DIAGNOSIS — J90 Pleural effusion, not elsewhere classified: Secondary | ICD-10-CM | POA: Diagnosis not present

## 2024-09-12 DIAGNOSIS — I5023 Acute on chronic systolic (congestive) heart failure: Secondary | ICD-10-CM

## 2024-09-12 DIAGNOSIS — I5033 Acute on chronic diastolic (congestive) heart failure: Secondary | ICD-10-CM | POA: Diagnosis present

## 2024-09-12 DIAGNOSIS — R918 Other nonspecific abnormal finding of lung field: Secondary | ICD-10-CM | POA: Diagnosis not present

## 2024-09-12 DIAGNOSIS — Z91199 Patient's noncompliance with other medical treatment and regimen due to unspecified reason: Secondary | ICD-10-CM

## 2024-09-12 DIAGNOSIS — G4733 Obstructive sleep apnea (adult) (pediatric): Secondary | ICD-10-CM | POA: Diagnosis present

## 2024-09-12 DIAGNOSIS — E871 Hypo-osmolality and hyponatremia: Secondary | ICD-10-CM | POA: Diagnosis not present

## 2024-09-12 DIAGNOSIS — K219 Gastro-esophageal reflux disease without esophagitis: Secondary | ICD-10-CM | POA: Diagnosis present

## 2024-09-12 DIAGNOSIS — I160 Hypertensive urgency: Secondary | ICD-10-CM | POA: Diagnosis present

## 2024-09-12 DIAGNOSIS — J9601 Acute respiratory failure with hypoxia: Secondary | ICD-10-CM | POA: Diagnosis present

## 2024-09-12 DIAGNOSIS — I480 Paroxysmal atrial fibrillation: Secondary | ICD-10-CM | POA: Diagnosis present

## 2024-09-12 DIAGNOSIS — D849 Immunodeficiency, unspecified: Secondary | ICD-10-CM | POA: Diagnosis present

## 2024-09-12 DIAGNOSIS — N1831 Chronic kidney disease, stage 3a: Secondary | ICD-10-CM | POA: Diagnosis present

## 2024-09-12 DIAGNOSIS — A029 Salmonella infection, unspecified: Secondary | ICD-10-CM | POA: Diagnosis not present

## 2024-09-12 DIAGNOSIS — E785 Hyperlipidemia, unspecified: Secondary | ICD-10-CM | POA: Diagnosis present

## 2024-09-12 DIAGNOSIS — K50119 Crohn's disease of large intestine with unspecified complications: Secondary | ICD-10-CM | POA: Diagnosis not present

## 2024-09-12 DIAGNOSIS — K5010 Crohn's disease of large intestine without complications: Secondary | ICD-10-CM | POA: Diagnosis present

## 2024-09-12 DIAGNOSIS — I1A0 Resistant hypertension: Secondary | ICD-10-CM | POA: Diagnosis present

## 2024-09-12 DIAGNOSIS — K5090 Crohn's disease, unspecified, without complications: Secondary | ICD-10-CM | POA: Diagnosis present

## 2024-09-12 DIAGNOSIS — I130 Hypertensive heart and chronic kidney disease with heart failure and stage 1 through stage 4 chronic kidney disease, or unspecified chronic kidney disease: Principal | ICD-10-CM | POA: Diagnosis present

## 2024-09-12 DIAGNOSIS — R590 Localized enlarged lymph nodes: Secondary | ICD-10-CM | POA: Diagnosis present

## 2024-09-12 DIAGNOSIS — Z905 Acquired absence of kidney: Secondary | ICD-10-CM

## 2024-09-12 DIAGNOSIS — K5730 Diverticulosis of large intestine without perforation or abscess without bleeding: Secondary | ICD-10-CM | POA: Diagnosis not present

## 2024-09-12 DIAGNOSIS — I2510 Atherosclerotic heart disease of native coronary artery without angina pectoris: Secondary | ICD-10-CM | POA: Diagnosis present

## 2024-09-12 DIAGNOSIS — F32A Depression, unspecified: Secondary | ICD-10-CM | POA: Diagnosis present

## 2024-09-12 DIAGNOSIS — I700 Atherosclerosis of aorta: Secondary | ICD-10-CM | POA: Diagnosis not present

## 2024-09-12 DIAGNOSIS — I5031 Acute diastolic (congestive) heart failure: Secondary | ICD-10-CM | POA: Diagnosis not present

## 2024-09-12 DIAGNOSIS — Z800 Family history of malignant neoplasm of digestive organs: Secondary | ICD-10-CM

## 2024-09-12 DIAGNOSIS — F419 Anxiety disorder, unspecified: Secondary | ICD-10-CM | POA: Diagnosis present

## 2024-09-12 DIAGNOSIS — I493 Ventricular premature depolarization: Secondary | ICD-10-CM | POA: Diagnosis present

## 2024-09-12 DIAGNOSIS — Z9861 Coronary angioplasty status: Secondary | ICD-10-CM

## 2024-09-12 DIAGNOSIS — R32 Unspecified urinary incontinence: Secondary | ICD-10-CM | POA: Diagnosis not present

## 2024-09-12 DIAGNOSIS — I252 Old myocardial infarction: Secondary | ICD-10-CM

## 2024-09-12 DIAGNOSIS — Z832 Family history of diseases of the blood and blood-forming organs and certain disorders involving the immune mechanism: Secondary | ICD-10-CM

## 2024-09-12 LAB — HEPATIC FUNCTION PANEL
ALT: 17 U/L (ref 0–44)
AST: 31 U/L (ref 15–41)
Albumin: 3.6 g/dL (ref 3.5–5.0)
Alkaline Phosphatase: 76 U/L (ref 38–126)
Bilirubin, Direct: 0.2 mg/dL (ref 0.0–0.2)
Indirect Bilirubin: 0.3 mg/dL (ref 0.3–0.9)
Total Bilirubin: 0.4 mg/dL (ref 0.0–1.2)
Total Protein: 7 g/dL (ref 6.5–8.1)

## 2024-09-12 LAB — I-STAT VENOUS BLOOD GAS, ED
Acid-base deficit: 2 mmol/L (ref 0.0–2.0)
Bicarbonate: 24.6 mmol/L (ref 20.0–28.0)
Calcium, Ion: 1.2 mmol/L (ref 1.15–1.40)
HCT: 32 % — ABNORMAL LOW (ref 36.0–46.0)
Hemoglobin: 10.9 g/dL — ABNORMAL LOW (ref 12.0–15.0)
O2 Saturation: 83 %
Potassium: 3.8 mmol/L (ref 3.5–5.1)
Sodium: 141 mmol/L (ref 135–145)
TCO2: 26 mmol/L (ref 22–32)
pCO2, Ven: 48 mmHg (ref 44–60)
pH, Ven: 7.318 (ref 7.25–7.43)
pO2, Ven: 52 mmHg — ABNORMAL HIGH (ref 32–45)

## 2024-09-12 LAB — CBC
HCT: 34 % — ABNORMAL LOW (ref 36.0–46.0)
Hemoglobin: 11 g/dL — ABNORMAL LOW (ref 12.0–15.0)
MCH: 32.5 pg (ref 26.0–34.0)
MCHC: 32.4 g/dL (ref 30.0–36.0)
MCV: 100.6 fL — ABNORMAL HIGH (ref 80.0–100.0)
Platelets: 187 K/uL (ref 150–400)
RBC: 3.38 MIL/uL — ABNORMAL LOW (ref 3.87–5.11)
RDW: 15.9 % — ABNORMAL HIGH (ref 11.5–15.5)
WBC: 6.1 K/uL (ref 4.0–10.5)
nRBC: 0 % (ref 0.0–0.2)

## 2024-09-12 LAB — BASIC METABOLIC PANEL WITH GFR
Anion gap: 11 (ref 5–15)
BUN: 17 mg/dL (ref 8–23)
CO2: 22 mmol/L (ref 22–32)
Calcium: 8.9 mg/dL (ref 8.9–10.3)
Chloride: 108 mmol/L (ref 98–111)
Creatinine, Ser: 1.2 mg/dL — ABNORMAL HIGH (ref 0.44–1.00)
GFR, Estimated: 46 mL/min — ABNORMAL LOW (ref >60)
Glucose, Bld: 101 mg/dL — ABNORMAL HIGH (ref 70–99)
Potassium: 4 mmol/L (ref 3.5–5.1)
Sodium: 141 mmol/L (ref 135–145)

## 2024-09-12 LAB — PROTIME-INR
INR: 1.8 — ABNORMAL HIGH (ref 0.8–1.2)
Prothrombin Time: 21.7 s — ABNORMAL HIGH (ref 11.4–15.2)

## 2024-09-12 LAB — PRO BRAIN NATRIURETIC PEPTIDE: Pro Brain Natriuretic Peptide: 1773 pg/mL — ABNORMAL HIGH (ref <300.0)

## 2024-09-12 LAB — TROPONIN T, HIGH SENSITIVITY: Troponin T High Sensitivity: 26 ng/L — ABNORMAL HIGH (ref 0–19)

## 2024-09-12 MED ORDER — IOHEXOL 350 MG/ML SOLN
125.0000 mL | Freq: Once | INTRAVENOUS | Status: AC | PRN
  Administered 2024-09-12: 125 mL via INTRAVENOUS

## 2024-09-12 MED ORDER — FUROSEMIDE 10 MG/ML IJ SOLN
40.0000 mg | Freq: Once | INTRAMUSCULAR | Status: AC
  Administered 2024-09-12: 40 mg via INTRAVENOUS
  Filled 2024-09-12: qty 4

## 2024-09-12 MED ORDER — CLONIDINE HCL 0.1 MG PO TABS
0.1000 mg | ORAL_TABLET | Freq: Two times a day (BID) | ORAL | Status: DC
  Administered 2024-09-13: 0.1 mg via ORAL
  Filled 2024-09-12: qty 1

## 2024-09-12 MED ORDER — CARVEDILOL 6.25 MG PO TABS
6.2500 mg | ORAL_TABLET | Freq: Two times a day (BID) | ORAL | Status: DC
  Administered 2024-09-13 (×2): 6.25 mg via ORAL
  Filled 2024-09-12 (×2): qty 1

## 2024-09-12 MED ORDER — HYDRALAZINE HCL 20 MG/ML IJ SOLN
20.0000 mg | Freq: Once | INTRAMUSCULAR | Status: AC
  Administered 2024-09-12: 20 mg via INTRAVENOUS
  Filled 2024-09-12: qty 1

## 2024-09-12 MED ORDER — HYDRALAZINE HCL 20 MG/ML IJ SOLN
10.0000 mg | Freq: Once | INTRAMUSCULAR | Status: AC
  Administered 2024-09-12: 10 mg via INTRAVENOUS
  Filled 2024-09-12: qty 1

## 2024-09-12 MED ORDER — ONDANSETRON HCL 4 MG/2ML IJ SOLN
4.0000 mg | Freq: Once | INTRAMUSCULAR | Status: AC
  Administered 2024-09-12: 4 mg via INTRAVENOUS
  Filled 2024-09-12: qty 2

## 2024-09-12 MED ORDER — METOCLOPRAMIDE HCL 5 MG/ML IJ SOLN
10.0000 mg | Freq: Once | INTRAMUSCULAR | Status: AC
  Administered 2024-09-12: 10 mg via INTRAVENOUS
  Filled 2024-09-12: qty 2

## 2024-09-12 MED ORDER — FENTANYL CITRATE (PF) 50 MCG/ML IJ SOSY
50.0000 ug | PREFILLED_SYRINGE | Freq: Once | INTRAMUSCULAR | Status: DC
  Filled 2024-09-12: qty 1

## 2024-09-12 MED ORDER — IRBESARTAN 300 MG PO TABS
300.0000 mg | ORAL_TABLET | Freq: Every day | ORAL | Status: DC
  Administered 2024-09-13 – 2024-09-18 (×6): 300 mg via ORAL
  Filled 2024-09-12 (×5): qty 1
  Filled 2024-09-12: qty 2

## 2024-09-12 MED ORDER — CLONIDINE HCL 0.1 MG PO TABS
0.1000 mg | ORAL_TABLET | Freq: Once | ORAL | Status: AC
  Administered 2024-09-12: 0.1 mg via ORAL
  Filled 2024-09-12: qty 1

## 2024-09-12 MED ORDER — DIPHENHYDRAMINE HCL 50 MG/ML IJ SOLN
25.0000 mg | Freq: Once | INTRAMUSCULAR | Status: AC
  Administered 2024-09-12: 25 mg via INTRAVENOUS
  Filled 2024-09-12: qty 1

## 2024-09-12 MED ORDER — AMLODIPINE BESYLATE 10 MG PO TABS
10.0000 mg | ORAL_TABLET | Freq: Every day | ORAL | Status: DC
  Administered 2024-09-13 – 2024-09-18 (×6): 10 mg via ORAL
  Filled 2024-09-12 (×4): qty 1
  Filled 2024-09-12: qty 2
  Filled 2024-09-12: qty 1

## 2024-09-12 NOTE — ED Notes (Signed)
 Pt placed on 2 L O2 via [place] due to O2 sats being 88% on RA, good waveform noted.  EDP aware and at bedside. RT made aware.  Pt repositioned in bed to sit more upright.

## 2024-09-12 NOTE — ED Triage Notes (Signed)
 Pt arrives with complaints of feeling shortness of breath for 1 month. States that when she eats she gets chills. States that she seen her PCP and they increased her BP meds back in October. States that her shortness of breath is worse when she is moving. Respiratory called to triage for evaluation.

## 2024-09-12 NOTE — Progress Notes (Incomplete)
 PHARMACY - ANTICOAGULATION CONSULT NOTE  Pharmacy Consult for warfarin Indication: atrial fibrillation  Patient Measurements: Height: 5' 3 (160 cm) Weight: 111.1 kg (245 lb) IBW/kg (Calculated) : 52.4 HEPARIN DW (KG): 79.2  Vital Signs: Temp: 98 F (36.7 C) (11/20 2158) Temp Source: Axillary (11/20 2158) BP: 158/54 (11/20 2233) Pulse Rate: 73 (11/20 2233)  Labs: Recent Labs    09/12/24 1437 09/12/24 1745  HGB 11.0* 10.9*  HCT 34.0* 32.0*  PLT 187  --   LABPROT 21.7*  --   INR 1.8*  --   CREATININE 1.20*  --     Estimated Creatinine Clearance: 45.5 mL/min (A) (by C-G formula based on SCr of 1.2 mg/dL (H)).   Medical History: Past Medical History:  Diagnosis Date  . Allergy   . Anxiety   . Arthritis   . Atrial fibrillation (HCC) 2008  . Blood transfusion without reported diagnosis   . CAD (coronary artery disease)   . Crohn's colitis (HCC)    she reports ulcerative colitis beginning in her 10's, biopsies 2008 suggest Crohn's not UC  . Depression   . Diverticulosis   . GERD (gastroesophageal reflux disease)   . Hyperlipidemia   . Hypertension   . IBS (irritable bowel syndrome)   . Keloid of skin   . Obesity   . OSA (obstructive sleep apnea)   . Pancreatitis   . Paroxysmal atrial fibrillation (HCC)   . Pseudoaneurysm of right femoral artery   . Renal oncocytoma    s/p right nephrectomy  . Rheumatoid arthritis (HCC) 05/29/2017  . Sleep apnea   . UC (ulcerative colitis) (HCC)     Medications:  -Warfarin 1.25mg  PO every Sun, Tues, Thurs; 2.5mg  PO all other days   Assessment: 30 yoF presented with shortness of breath. Pharmacy consulted to dose warfarin for history of atrial fibrillation.  -Hgb 11s, plts 187 -INR 1.8 -Last dose of warfarin 11/20 PM  Goal of Therapy:  INR 2-3 Monitor platelets by anticoagulation protocol: Yes   Plan:  {[id number]}  Lynwood Poplar 09/12/2024,11:58 PM

## 2024-09-12 NOTE — ED Provider Notes (Addendum)
 [place] [facility] Provider Note   CSN: [id number] Arrival date & time: 09/12/24  1428     Patient presents with: Shortness of Breath   [name] is a 78 y.o. female hx of HTN, heart failure here presenting with shortness of breath.  Patient been having difficult to control blood pressure.  Her blood pressure has been elevated and was on valsartan  320 mg daily.  Patient has seen PCP as well as cardiology multiple times and recently added clonidine  0.1 mg twice daily.  Patient also has been on Lasix  recently since her legs have been more swollen.  Patient states that despite that she is getting more short of breath and her blood pressure remains uncontrolled.  She denies any headache or vomiting or abdominal pain patient states that she is still taking Coumadin  since she has a history of A-fib.  Patient was noted to be hypoxic with oxygen of 88% on room air   The history is provided by the patient.       Prior to Admission medications   Medication Sig Start Date End Date Taking? Authorizing Provider  amLODipine  (NORVASC ) 10 MG tablet TAKE 1 TABLET BY MOUTH EVERY DAY 12/22/23   Carlin Delon BROCKS, NP  carvedilol  (COREG ) 6.25 MG tablet Take 1 tablet (6.25 mg total) by mouth 2 (two) times daily. 08/27/24   Daneen Damien BROCKS, NP  clobetasol ointment (TEMOVATE) 0.05 % Apply 1 Application topically 2 (two) times daily. Patient taking differently: Apply 1 Application topically as needed. 11/24/23   [provider]  cloNIDine  (CATAPRES ) 0.1 MG tablet Take 1 tablet (0.1 mg total) by mouth 2 (two) times daily. 09/10/24   Wheeler Harlene CROME, NP  dextrose 5 % SOLN 50 mL with methotrexate 25 MG/ML (PF) SOLN 40 mg/m2 Inject 40 mg/m2 into the vein once a week. Every wednesday    [provider]  dofetilide  (TIKOSYN ) 125 MCG capsule TAKE 3 CAPSULES BY MOUTH TWICE A DAY 11/20/23   Pietro Redell GORMAN, MD  doxazosin  (CARDURA ) 2 MG tablet TAKE 1 TABLET  BY MOUTH EVERY DAY 12/22/23   Carlin Delon BROCKS, NP  escitalopram  (LEXAPRO ) 10 MG tablet Take 1 tablet (10 mg total) by mouth daily. 08/02/24   Domenica Harlene LABOR, MD  esomeprazole  (NEXIUM ) 40 MG capsule Take 1 capsule (40 mg total) by mouth daily. Take in the morning before breakfast. 01/15/18   Esterwood, Amy S, PA-C  folic acid  (FOLVITE ) 1 MG tablet Take 2 mg by mouth daily. Patient taking differently: Take 1 mg by mouth 2 (two) times daily. 09/25/19   [provider]  furosemide  (LASIX ) 40 MG tablet Take 1 tablet for 3 days and hold 08/27/24   Daneen Damien BROCKS, NP  hydrocortisone  (ANUSOL -HC) 25 MG suppository Place 1 suppository (25 mg total) rectally 2 (two) times daily as needed for hemorrhoids or anal itching. 04/17/24   Avram Lupita BRAVO, MD  inFLIXimab  10 mg/kg in sodium chloride  0.9 % Inject 10 mg/kg into the vein every 6 (six) weeks.    [provider]  isosorbide  mononitrate (IMDUR ) 30 MG 24 hr tablet Take 1 tablet (30 mg total) by mouth daily. Patient not taking: Reported on 09/10/2024 09/03/24 12/08/24  Barbaraann Darryle Ned, MD  loperamide  (IMODIUM  A-D) 2 MG tablet Take 1 mg by mouth as needed. Dihrrhea    [provider]  Multiple Vitamin (MULTIVITAMIN) capsule Take 1 capsule by mouth daily. 1 daily    [provider]  NEOMYCIN -POLYMYXIN-HYDROCORTISONE  (CORTISPORIN) 1 % SOLN OTIC solution PLACE 3 DROPS INTO THE LEFT EAR 3 TIMES DAILY. 08/02/24   Webb, Padonda B, FNP  nitroGLYCERIN  (NITROSTAT ) 0.4 MG SL tablet Place 1 tablet (0.4 mg total) under the tongue every 5 (five) minutes as needed for chest pain. Do not exceed 3 tabs in 15 minutes 08/20/18   Pietro Redell RAMAN, MD  Probiotic Product [facility]ALTH) CAPS Take 1 capsule by mouth daily. 1 per day    [provider]  spironolactone  (ALDACTONE ) 25 MG tablet Take 0.5 tablets (12.5 mg total) by mouth daily. 08/27/24   Daneen Damien BROCKS, NP  TUBERCULIN SYR 1CC/27GX1/2 27G X 1/2 1 ML MISC      [provider]  valsartan  (DIOVAN ) 160 MG tablet TAKE 1 AND 1/2 TABLETS ONCE DAILY Patient taking differently: Take 160 mg by mouth 2 (two) times daily. TAKE 1 TWICE A DAY 07/24/24   Pietro Redell RAMAN, MD  valsartan  (DIOVAN ) 80 MG tablet Take 1 tablet (80 mg total) by mouth daily. 07/31/24   Pietro Redell RAMAN, MD  warfarin (COUMADIN ) 2.5 MG tablet TAKE 1/2 TABLET TO 1 TABLET BY MOUTH DAILY AS DIRECTED BY COUMADIN  CLINIC 07/05/24   Pietro Redell RAMAN, MD    Allergies: Hydralazine , Cefdinir , Codeine, Fosamax [alendronate sodium], Gluten meal, Guaifenesin er, Latex, Percocet [oxycodone -acetaminophen ], and Psyllium    Review of Systems  Respiratory:  Positive for shortness of breath.   All other systems reviewed and are negative.   Updated Vital Signs BP (!) 197/69   Pulse 88   Temp 97.7 F (36.5 C) (Oral)   Resp (!) 21   Ht 5' 3 (1.6 m)   Wt 111.1 kg   SpO2 100%   BMI 43.40 kg/m   Physical Exam Vitals and nursing note reviewed.  Constitutional:      Comments: Chronically ill  HENT:     Head: Normocephalic.     Mouth/Throat:     Mouth: Mucous membranes are moist.  Eyes:     Extraocular Movements: Extraocular movements intact.     Pupils: Pupils are equal, round, and reactive to light.  Cardiovascular:     Rate and Rhythm: Normal rate and regular rhythm.  Pulmonary:     Comments: Crackles bilateral bases Musculoskeletal:     Cervical back: Normal range of motion and neck supple.     Comments: 1 + edema bilaterally   Skin:    General: Skin is warm.  Neurological:     General: No focal deficit present.     Mental Status: She is oriented to person, place, and time.  Psychiatric:        Mood and Affect: Mood normal.        Behavior: Behavior normal.     (all labs ordered are listed, but only abnormal results are displayed) Labs Reviewed  BASIC METABOLIC PANEL WITH GFR - Abnormal; Notable for the following components:      Result Value   Glucose, Bld 101 (*)     Creatinine, Ser 1.20 (*)    GFR, Estimated 46 (*)    All other components within normal limits  CBC - Abnormal; Notable for the following components:   RBC 3.38 (*)    Hemoglobin 11.0 (*)    HCT 34.0 (*)    MCV 100.6 (*)    RDW 15.9 (*)    All other components within normal limits  PROTIME-INR - Abnormal; Notable for the following components:   Prothrombin Time 21.7 (*)  INR 1.8 (*)    All other components within normal limits  PRO BRAIN NATRIURETIC PEPTIDE - Abnormal; Notable for the following components:   Pro Brain Natriuretic Peptide 1,773.0 (*)    All other components within normal limits  I-STAT VENOUS BLOOD GAS, ED - Abnormal; Notable for the following components:   pO2, Ven 52 (*)    HCT 32.0 (*)    Hemoglobin 10.9 (*)    All other components within normal limits  TROPONIN T, HIGH SENSITIVITY - Abnormal; Notable for the following components:   Troponin T High Sensitivity 26 (*)    All other components within normal limits  HEPATIC FUNCTION PANEL    EKG: None  Radiology: CT Angio Chest/Abd/Pel for Dissection W and/or Wo Contrast Result Date: 09/12/2024 CLINICAL DATA:  Acute aortic syndrome suspected. Shortness of breath. EXAM: CT ANGIOGRAPHY CHEST, ABDOMEN AND PELVIS TECHNIQUE: Noncontrast chest CT was performed. Multidetector CT imaging through the chest, abdomen and pelvis was performed using the standard protocol during bolus administration of intravenous contrast. Multiplanar reconstructed images and MIPs were obtained and reviewed to evaluate the vascular anatomy. RADIATION DOSE REDUCTION: This exam was performed according to the departmental dose-optimization program which includes automated exposure control, adjustment of the mA and/or kV according to patient size and/or use of iterative reconstruction technique. CONTRAST:  OMNIPAQUE  IOHEXOL  350 MG/ML SOLN COMPARISON:  CT angiogram chest abdomen and pelvis 09/06/2023. FINDINGS: CTA CHEST FINDINGS  Cardiovascular: Preferential opacification of the thoracic aorta. No evidence of thoracic aortic aneurysm or dissection. The heart is mildly enlarged. No pericardial effusion. The left vertebral artery arises from the aortic arch. Mediastinum/Nodes: There are nonenlarged and enlarged paratracheal lymph nodes which have increased in size and number measuring up to 14 mm. Nonenlarged subcarinal, right hilar, and prevascular lymph nodes have increased in number when compared to the prior study. Visualized thyroid  gland and esophagus are within normal limits. Lungs/Pleura: There are small to moderate-sized bilateral pleural effusions. There is compressive atelectasis in the bilateral lower lobes. The lungs are otherwise clear. There are few calcified granulomas in right lung. Musculoskeletal: No chest wall abnormality. No acute or significant osseous findings. Review of the MIP images confirms the above findings. CTA ABDOMEN AND PELVIS FINDINGS VASCULAR Aorta: Normal caliber aorta without aneurysm, dissection, vasculitis or significant stenosis. There are atherosclerotic calcifications of the aorta. Celiac: Patent without evidence of aneurysm, dissection, vasculitis or significant stenosis. SMA: Patent without evidence of aneurysm, dissection, vasculitis or significant stenosis. Renals: Right nephrectomy changes present. Left renal artery within normal limits. IMA: Patent without evidence of aneurysm, dissection, vasculitis or significant stenosis. Inflow: Patent without evidence of aneurysm, dissection, vasculitis or significant stenosis. Calcified atherosclerotic disease present. Veins: No obvious venous abnormality within the limitations of this arterial phase study. Review of the MIP images confirms the above findings. NON-VASCULAR Hepatobiliary: No focal liver abnormality is seen. Status post cholecystectomy. No biliary dilatation. Pancreas: Unremarkable. No pancreatic ductal dilatation or surrounding inflammatory  changes. Spleen: Normal in size without focal abnormality. 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 Bilateral adrenal glands and left kidney are within normal limits. The bladder is decompressed. There is a small amount of air in the bladder. Stomach/Bowel: Stomach is within normal limits. Appendix appears normal. No evidence of bowel wall thickening, distention, or inflammatory changes. There scattered colonic diverticula. Lymphatic: No enlarged lymph nodes are seen. Reproductive: Uterus and bilateral adnexa are unremarkable. Other: No abdominal wall hernia or abnormality. No abdominopelvic ascites. Musculoskeletal: No acute or significant osseous findings.  Review of the MIP images confirms the above findings. IMPRESSION: 1. No evidence for aortic dissection or aneurysm. 2. Small to moderate-sized bilateral pleural effusions with compressive atelectasis in the lower lobes. 3. Mild cardiomegaly. 4. Mediastinal lymphadenopathy, increased from prior. Findings may be reactive, but metastatic disease can not be excluded. 5. Small amount of air in the bladder. 6. Colonic diverticulosis. 7. Aortic atherosclerosis. Aortic Atherosclerosis (ICD10-I70.0). Electronically Signed   By: Greig Pique M.D.   On: 09/12/2024 18:09   DG Chest 2 View Result Date: 09/12/2024 EXAM: 2 VIEW(S) XRAY OF THE CHEST 09/12/2024 03:30:31 PM COMPARISON: 09/10/2023 CLINICAL HISTORY: shortness of breath FINDINGS: LUNGS AND PLEURA: Minimal interstitial densities are noted bilaterally suggesting possible pulmonary edema. Small pleural effusions are noted. Minimal left basilar atelectasis. No pneumothorax. HEART AND MEDIASTINUM: Stable cardiomegaly. BONES AND SOFT TISSUES: No acute osseous abnormality. IMPRESSION: 1. Stable cardiomegaly. 2. Minimal interstitial opacities bilaterally, compatible with mild pulmonary edema. 3. Small bilateral pleural effusions. 4. Minimal left basilar atelectasis. Electronically signed by: Lynwood Seip MD 09/12/2024 03:42 PM EST RP Workstation: [id number]     Procedures   CRITICAL CARE Performed by: Alm VEAR Cave   Total critical care time: 33 minutes  Critical care time was exclusive of separately billable procedures and treating other patients.  Critical care was necessary to treat or prevent imminent or life-threatening deterioration.  Critical care was time spent personally by me on the following activities: development of treatment plan with patient and/or surrogate as well as nursing, discussions with consultants, evaluation of patient's response to treatment, examination of patient, obtaining history from patient or surrogate, ordering and performing treatments and interventions, ordering and review of laboratory studies, ordering and review of radiographic studies, pulse oximetry and re-evaluation of patient's condition.   Medications Ordered in the ED  hydrALAZINE  (APRESOLINE ) injection 10 mg (10 mg Intravenous Given 09/12/24 1649)  ondansetron  (ZOFRAN ) injection 4 mg (4 mg Intravenous Given 09/12/24 1649)  furosemide  (LASIX ) injection 40 mg (40 mg Intravenous Given 09/12/24 1735)  iohexol  (OMNIPAQUE ) 350 MG/ML injection 125 mL (125 mLs Intravenous Contrast Given 09/12/24 1708)  hydrALAZINE  (APRESOLINE ) injection 20 mg (20 mg Intravenous Given 09/12/24 1823)  cloNIDine  (CATAPRES ) tablet 0.1 mg (0.1 mg Oral Given 09/12/24 1840)                                    Medical Decision Making [name] is a 78 y.o. female here presenting with shortness of breath and uncontrolled hypertension.  Concern for possible heart failure exacerbation.  Given very elevated blood pressure consider dissection as well.  Plan to get CBC CMP and BNP and troponin and CT dissection study.   7:07 PM I reviewed patient's labs and BNP is elevated at 1700.  CTA showed no dissection just large pleural effusions.  Patient felt short of breath and is on CPAP at home so patient is started on  BiPAP.  Patient also given hydralazine  and blood pressure is around 190 now.  Will admit to stepdown at [facility].  Problems Addressed: Acute on chronic systolic congestive heart failure [facility]): chronic illness or injury with exacerbation, progression, or side effects of treatment Resistant hypertension: chronic illness or injury with exacerbation, progression, or side effects of treatment  Amount and/or Complexity of Data Reviewed Labs: ordered. Decision-making details documented in ED Course. Radiology: ordered and independent interpretation performed. Decision-making details documented in ED Course. ECG/medicine tests: ordered and independent  interpretation performed. Decision-making details documented in ED Course.  Risk Prescription drug management. Decision regarding hospitalization.     Final diagnoses:  Resistant hypertension  Acute on chronic systolic congestive heart failure [facility])    ED Discharge Orders     None          Patt Alm Macho, MD 09/12/24 1910    Patt Alm Macho, MD 09/13/24 [phone]

## 2024-09-12 NOTE — Progress Notes (Signed)
 PHARMACY - ANTICOAGULATION CONSULT NOTE  Pharmacy Consult for warfarin Indication: atrial fibrillation  Patient Measurements: Height: 5' 3 (160 cm) Weight: 111.1 kg (245 lb) IBW/kg (Calculated) : 52.4 HEPARIN DW (KG): 79.2  Vital Signs: Temp: 98 F (36.7 C) (11/20 2158) Temp Source: Axillary (11/20 2158) BP: 158/54 (11/20 2233) Pulse Rate: 73 (11/20 2233)  Labs: Recent Labs    09/12/24 1437 09/12/24 1745  HGB 11.0* 10.9*  HCT 34.0* 32.0*  PLT 187  --   LABPROT 21.7*  --   INR 1.8*  --   CREATININE 1.20*  --     Estimated Creatinine Clearance: 45.5 mL/min (A) (by C-G formula based on SCr of 1.2 mg/dL (H)).   Medical History: Past Medical History:  Diagnosis Date   Allergy    Anxiety    Arthritis    Atrial fibrillation (HCC) 2008   Blood transfusion without reported diagnosis    CAD (coronary artery disease)    Crohn's colitis (HCC)    she reports ulcerative colitis beginning in her 72's, biopsies 2008 suggest Crohn's not UC   Depression    Diverticulosis    GERD (gastroesophageal reflux disease)    Hyperlipidemia    Hypertension    IBS (irritable bowel syndrome)    Keloid of skin    Obesity    OSA (obstructive sleep apnea)    Pancreatitis    Paroxysmal atrial fibrillation (HCC)    Pseudoaneurysm of right femoral artery    Renal oncocytoma    s/p right nephrectomy   Rheumatoid arthritis (HCC) 05/29/2017   Sleep apnea    UC (ulcerative colitis) (HCC)     Medications:  -Warfarin 1.25mg  PO every Sun, Tues, Thurs; 2.5mg  PO all other days   Assessment: [name] presented with shortness of breath. Pharmacy consulted to dose warfarin for history of atrial fibrillation.  -Hgb 11s, plts 187 -INR 1.8 -Last dose of warfarin 11/19 PM  Goal of Therapy:  INR 2-3 Monitor platelets by anticoagulation protocol: Yes   Plan:  -Warfarin 2.5mg  PO x1 now -Continue home regimen of 1.25mg  PO Sun,Tues,Thurs and 2.5mg  PO all other days -INR/CBC daily  Lynwood Poplar, PharmD, BCPS Clinical Pharmacist 09/13/2024 12:08 AM

## 2024-09-12 NOTE — ED Notes (Signed)
 RT assessed at triage. BBS clear, slightly diminished in bases. No distress noted. Stated she has been SOB on and off for a few months.

## 2024-09-12 NOTE — ED Notes (Signed)
 Pt in imaging

## 2024-09-13 ENCOUNTER — Observation Stay (HOSPITAL_COMMUNITY)

## 2024-09-13 DIAGNOSIS — Z6841 Body Mass Index (BMI) 40.0 and over, adult: Secondary | ICD-10-CM | POA: Diagnosis not present

## 2024-09-13 DIAGNOSIS — D631 Anemia in chronic kidney disease: Secondary | ICD-10-CM | POA: Diagnosis present

## 2024-09-13 DIAGNOSIS — E66813 Obesity, class 3: Secondary | ICD-10-CM | POA: Diagnosis present

## 2024-09-13 DIAGNOSIS — I1A0 Resistant hypertension: Secondary | ICD-10-CM | POA: Diagnosis present

## 2024-09-13 DIAGNOSIS — I5033 Acute on chronic diastolic (congestive) heart failure: Secondary | ICD-10-CM | POA: Diagnosis present

## 2024-09-13 DIAGNOSIS — E785 Hyperlipidemia, unspecified: Secondary | ICD-10-CM

## 2024-09-13 DIAGNOSIS — I160 Hypertensive urgency: Secondary | ICD-10-CM | POA: Diagnosis present

## 2024-09-13 DIAGNOSIS — K5010 Crohn's disease of large intestine without complications: Secondary | ICD-10-CM | POA: Diagnosis present

## 2024-09-13 DIAGNOSIS — K50119 Crohn's disease of large intestine with unspecified complications: Secondary | ICD-10-CM | POA: Diagnosis not present

## 2024-09-13 DIAGNOSIS — N179 Acute kidney failure, unspecified: Secondary | ICD-10-CM | POA: Diagnosis not present

## 2024-09-13 DIAGNOSIS — I2510 Atherosclerotic heart disease of native coronary artery without angina pectoris: Secondary | ICD-10-CM | POA: Diagnosis present

## 2024-09-13 DIAGNOSIS — I480 Paroxysmal atrial fibrillation: Secondary | ICD-10-CM

## 2024-09-13 DIAGNOSIS — Z7901 Long term (current) use of anticoagulants: Secondary | ICD-10-CM | POA: Diagnosis not present

## 2024-09-13 DIAGNOSIS — D849 Immunodeficiency, unspecified: Secondary | ICD-10-CM | POA: Diagnosis present

## 2024-09-13 DIAGNOSIS — F32A Depression, unspecified: Secondary | ICD-10-CM | POA: Diagnosis present

## 2024-09-13 DIAGNOSIS — D649 Anemia, unspecified: Secondary | ICD-10-CM | POA: Diagnosis not present

## 2024-09-13 DIAGNOSIS — F341 Dysthymic disorder: Secondary | ICD-10-CM | POA: Diagnosis not present

## 2024-09-13 DIAGNOSIS — J9601 Acute respiratory failure with hypoxia: Secondary | ICD-10-CM | POA: Diagnosis present

## 2024-09-13 DIAGNOSIS — M069 Rheumatoid arthritis, unspecified: Secondary | ICD-10-CM | POA: Diagnosis present

## 2024-09-13 DIAGNOSIS — E871 Hypo-osmolality and hyponatremia: Secondary | ICD-10-CM | POA: Diagnosis not present

## 2024-09-13 DIAGNOSIS — I5031 Acute diastolic (congestive) heart failure: Secondary | ICD-10-CM | POA: Diagnosis not present

## 2024-09-13 DIAGNOSIS — I10 Essential (primary) hypertension: Secondary | ICD-10-CM | POA: Diagnosis not present

## 2024-09-13 DIAGNOSIS — G4733 Obstructive sleep apnea (adult) (pediatric): Secondary | ICD-10-CM | POA: Diagnosis present

## 2024-09-13 DIAGNOSIS — R7989 Other specified abnormal findings of blood chemistry: Secondary | ICD-10-CM | POA: Diagnosis not present

## 2024-09-13 DIAGNOSIS — I5030 Unspecified diastolic (congestive) heart failure: Secondary | ICD-10-CM | POA: Diagnosis present

## 2024-09-13 DIAGNOSIS — I130 Hypertensive heart and chronic kidney disease with heart failure and stage 1 through stage 4 chronic kidney disease, or unspecified chronic kidney disease: Secondary | ICD-10-CM | POA: Diagnosis present

## 2024-09-13 DIAGNOSIS — A029 Salmonella infection, unspecified: Secondary | ICD-10-CM | POA: Diagnosis not present

## 2024-09-13 DIAGNOSIS — N1831 Chronic kidney disease, stage 3a: Secondary | ICD-10-CM

## 2024-09-13 DIAGNOSIS — I4821 Permanent atrial fibrillation: Secondary | ICD-10-CM | POA: Diagnosis present

## 2024-09-13 DIAGNOSIS — J9811 Atelectasis: Secondary | ICD-10-CM | POA: Diagnosis present

## 2024-09-13 DIAGNOSIS — A0472 Enterocolitis due to Clostridium difficile, not specified as recurrent: Secondary | ICD-10-CM | POA: Diagnosis not present

## 2024-09-13 DIAGNOSIS — K219 Gastro-esophageal reflux disease without esophagitis: Secondary | ICD-10-CM | POA: Diagnosis present

## 2024-09-13 LAB — CBC
HCT: 30.5 % — ABNORMAL LOW (ref 36.0–46.0)
Hemoglobin: 9.7 g/dL — ABNORMAL LOW (ref 12.0–15.0)
MCH: 32.1 pg (ref 26.0–34.0)
MCHC: 31.8 g/dL (ref 30.0–36.0)
MCV: 101 fL — ABNORMAL HIGH (ref 80.0–100.0)
Platelets: 180 K/uL (ref 150–400)
RBC: 3.02 MIL/uL — ABNORMAL LOW (ref 3.87–5.11)
RDW: 16 % — ABNORMAL HIGH (ref 11.5–15.5)
WBC: 6.2 K/uL (ref 4.0–10.5)
nRBC: 0 % (ref 0.0–0.2)

## 2024-09-13 LAB — ECHOCARDIOGRAM COMPLETE
Area-P 1/2: 2.28 cm2
Height: 63 in
S' Lateral: 2.9 cm
Weight: 3920 [oz_av]

## 2024-09-13 LAB — BASIC METABOLIC PANEL WITH GFR
Anion gap: 12 (ref 5–15)
BUN: 19 mg/dL (ref 8–23)
CO2: 23 mmol/L (ref 22–32)
Calcium: 8.2 mg/dL — ABNORMAL LOW (ref 8.9–10.3)
Chloride: 105 mmol/L (ref 98–111)
Creatinine, Ser: 1.6 mg/dL — ABNORMAL HIGH (ref 0.44–1.00)
GFR, Estimated: 33 mL/min — ABNORMAL LOW (ref >60)
Glucose, Bld: 100 mg/dL — ABNORMAL HIGH (ref 70–99)
Potassium: 3.7 mmol/L (ref 3.5–5.1)
Sodium: 140 mmol/L (ref 135–145)

## 2024-09-13 LAB — MAGNESIUM: Magnesium: 2 mg/dL (ref 1.7–2.4)

## 2024-09-13 LAB — PROTIME-INR
INR: 1.9 — ABNORMAL HIGH (ref 0.8–1.2)
Prothrombin Time: 22.3 s — ABNORMAL HIGH (ref 11.4–15.2)

## 2024-09-13 MED ORDER — PHILLIPS COLON HEALTH PO CAPS: 1.0000 | ORAL_CAPSULE | Freq: Every day | ORAL | Status: DC

## 2024-09-13 MED ORDER — ACETAMINOPHEN 325 MG PO TABS
650.0000 mg | ORAL_TABLET | Freq: Once | ORAL | Status: AC
  Administered 2024-09-13: 650 mg via ORAL
  Filled 2024-09-13: qty 2

## 2024-09-13 MED ORDER — IPRATROPIUM-ALBUTEROL 0.5-2.5 (3) MG/3ML IN SOLN: 3.0000 mL | RESPIRATORY_TRACT | Status: DC | PRN

## 2024-09-13 MED ORDER — POTASSIUM CHLORIDE CRYS ER 20 MEQ PO TBCR
60.0000 meq | EXTENDED_RELEASE_TABLET | ORAL | Status: AC
  Administered 2024-09-13: 60 meq via ORAL
  Filled 2024-09-13: qty 3

## 2024-09-13 MED ORDER — WARFARIN 1.25 MG HALF TABLET
1.2500 mg | ORAL_TABLET | ORAL | Status: DC
  Administered 2024-09-15: 1.25 mg via ORAL
  Filled 2024-09-13 (×2): qty 1

## 2024-09-13 MED ORDER — SODIUM CHLORIDE 0.9% FLUSH
3.0000 mL | Freq: Two times a day (BID) | INTRAVENOUS | Status: DC
  Administered 2024-09-13 – 2024-09-17 (×9): 3 mL via INTRAVENOUS

## 2024-09-13 MED ORDER — WARFARIN SODIUM 2.5 MG PO TABS
2.5000 mg | ORAL_TABLET | Freq: Once | ORAL | Status: AC
  Administered 2024-09-13: 2.5 mg via ORAL
  Filled 2024-09-13: qty 1

## 2024-09-13 MED ORDER — FOLIC ACID 1 MG PO TABS
1.0000 mg | ORAL_TABLET | Freq: Two times a day (BID) | ORAL | Status: DC
  Administered 2024-09-13 – 2024-09-18 (×10): 1 mg via ORAL
  Filled 2024-09-13 (×10): qty 1

## 2024-09-13 MED ORDER — LOPERAMIDE HCL 1 MG/7.5ML PO SUSP
1.0000 mg | ORAL | Status: DC | PRN
  Administered 2024-09-17: 1 mg via ORAL
  Filled 2024-09-13 (×2): qty 7.5

## 2024-09-13 MED ORDER — HYDRALAZINE HCL 20 MG/ML IJ SOLN
10.0000 mg | INTRAMUSCULAR | Status: DC | PRN
  Administered 2024-09-13 – 2024-09-15 (×2): 10 mg via INTRAVENOUS
  Filled 2024-09-13 (×2): qty 1

## 2024-09-13 MED ORDER — ACETAMINOPHEN 650 MG RE SUPP: 650.0000 mg | Freq: Four times a day (QID) | RECTAL | Status: DC | PRN

## 2024-09-13 MED ORDER — ESCITALOPRAM OXALATE 10 MG PO TABS
10.0000 mg | ORAL_TABLET | Freq: Every day | ORAL | Status: DC
  Administered 2024-09-13 – 2024-09-18 (×6): 10 mg via ORAL
  Filled 2024-09-13 (×6): qty 1

## 2024-09-13 MED ORDER — ACETAMINOPHEN 325 MG PO TABS
650.0000 mg | ORAL_TABLET | Freq: Four times a day (QID) | ORAL | Status: DC | PRN
Start: 2024-09-13 — End: 2024-09-18
  Administered 2024-09-13 – 2024-09-17 (×5): 650 mg via ORAL
  Filled 2024-09-13 (×5): qty 2

## 2024-09-13 MED ORDER — DOFETILIDE 250 MCG PO CAPS
375.0000 ug | ORAL_CAPSULE | Freq: Two times a day (BID) | ORAL | Status: DC
  Administered 2024-09-14 – 2024-09-17 (×8): 375 ug via ORAL
  Filled 2024-09-13 (×8): qty 1

## 2024-09-13 MED ORDER — RISAQUAD PO CAPS
1.0000 | ORAL_CAPSULE | Freq: Every day | ORAL | Status: DC
  Administered 2024-09-13 – 2024-09-18 (×6): 1 via ORAL
  Filled 2024-09-13 (×6): qty 1

## 2024-09-13 MED ORDER — EMPAGLIFLOZIN 10 MG PO TABS
10.0000 mg | ORAL_TABLET | Freq: Every day | ORAL | Status: DC
  Administered 2024-09-13: 10 mg via ORAL
  Filled 2024-09-13: qty 1

## 2024-09-13 MED ORDER — CARVEDILOL 12.5 MG PO TABS
12.5000 mg | ORAL_TABLET | Freq: Two times a day (BID) | ORAL | Status: DC
  Administered 2024-09-13 – 2024-09-14 (×3): 12.5 mg via ORAL
  Filled 2024-09-13 (×4): qty 1

## 2024-09-13 MED ORDER — FUROSEMIDE 10 MG/ML IJ SOLN
40.0000 mg | Freq: Two times a day (BID) | INTRAMUSCULAR | Status: DC
  Administered 2024-09-13 – 2024-09-15 (×4): 40 mg via INTRAVENOUS
  Filled 2024-09-13 (×4): qty 4

## 2024-09-13 MED ORDER — WARFARIN - PHARMACIST DOSING INPATIENT
Freq: Every day | Status: DC
  Filled 2024-09-13: qty 1

## 2024-09-13 MED ORDER — MAGNESIUM SULFATE 2 GM/50ML IV SOLN
2.0000 g | Freq: Once | INTRAVENOUS | Status: AC
  Administered 2024-09-13: 2 g via INTRAVENOUS
  Filled 2024-09-13: qty 50

## 2024-09-13 MED ORDER — SPIRONOLACTONE 12.5 MG HALF TABLET
12.5000 mg | ORAL_TABLET | Freq: Every day | ORAL | Status: DC
  Administered 2024-09-13 – 2024-09-18 (×6): 12.5 mg via ORAL
  Filled 2024-09-13 (×6): qty 1

## 2024-09-13 MED ORDER — WARFARIN SODIUM 2.5 MG PO TABS
2.5000 mg | ORAL_TABLET | ORAL | Status: DC
  Administered 2024-09-13 – 2024-09-14 (×2): 2.5 mg via ORAL
  Filled 2024-09-13 (×2): qty 1

## 2024-09-13 MED ORDER — PANTOPRAZOLE SODIUM 40 MG PO TBEC
40.0000 mg | DELAYED_RELEASE_TABLET | Freq: Every day | ORAL | Status: DC
  Administered 2024-09-13 – 2024-09-15 (×3): 40 mg via ORAL
  Filled 2024-09-13 (×3): qty 1

## 2024-09-13 MED ORDER — POTASSIUM CHLORIDE 10 MEQ/100ML IV SOLN
10.0000 meq | INTRAVENOUS | Status: AC
Start: 2024-09-13 — End: 2024-09-14
  Administered 2024-09-13 – 2024-09-14 (×4): 10 meq via INTRAVENOUS
  Filled 2024-09-13 (×4): qty 100

## 2024-09-13 NOTE — Progress Notes (Signed)
  Echocardiogram 2D Echocardiogram has been performed.  Tinnie FORBES Gosling RDCS 09/13/2024, 3:23 PM

## 2024-09-13 NOTE — ED Notes (Signed)
 Patient taken off BiPAP and placed on 2L by RN per patient request. RT will continue to monitor.

## 2024-09-13 NOTE — ED Notes (Signed)
 Patients family going to get breakfast for patient due to gluten restrictions.

## 2024-09-13 NOTE — ED Notes (Signed)
 Report called to [place], RT at [facility]. Sent BiPAP mask with patient as she wears it at bedtime.

## 2024-09-13 NOTE — Progress Notes (Addendum)
 Pharmacy: Dofetilide  (Tikosyn ) - Initial Consult Assessment and Electrolyte Replacement  Pharmacy consulted to assist in monitoring and replacing electrolytes in this 78 y.o. female admitted on 09/12/2024 undergoing dofetilide  re-initiation. Pt was on dofetilide  pta but missed 2 doses - last dose 11/20 0800.  Assessment:  Patient Exclusion Criteria: If any screening criteria checked as Yes, then  patient  should NOT receive dofetilide  until criteria item is corrected.  If "Yes" please indicate correction plan.  YES  NO Patient  Exclusion Criteria Correction Plan/Comments   [x]   []   Baseline QTc interval is greater than or equal to 440 msec. IF above YES box checked dofetilide  contraindicated unless patient has ICD; then may proceed if QTc 500-550 msec or with known ventricular conduction abnormalities may proceed with QTc 550-600 msec. QTc = 460 MD aware and continuing to monitor   []   [x]   Patient is known or suspected to have a digoxin level greater than 2 ng/ml: No results found for: DIGOXIN     []   [x]   Creatinine clearance less than 20 ml/min (calculated using Cockcroft-Gault, actual body weight and serum creatinine): Estimated Creatinine Clearance: 34.2 mL/min (A) (by C-G formula based on SCr of 1.6 mg/dL (H)).     []   [x]  Patient has received drugs known to prolong the QT intervals within the last 48 hour (examples: phenothiazines, tricyclics or tetracyclic antidepressants, macrolides, 1st generation H-1 antihistamines (especially diphenhydramine ), fluoroquinolones, azoles, ondansetron , metoclopramide , promethazine).   Updated information on QT prolonging agents is available to be searched on the following database:QT prolonging agents -If SSRI or antihistamine needed, preferred options are sertraline and loratadine respectively     []   [x]  Patient received a dose of a thiazide diuretic in the last 48 hours [including hydrochlorothiazide  (Oretic ) alone or in any  combination including triamterene (Dyazide, Maxzide)].    []   [x]  Patient received a medication known to increase dofetilide  plasma concentrations prior to initial dofetilide  dose:  Trimethoprim (Primsol, Proloprim) in the last 36 hours Verapamil (Calan, Verelan) in the last 36 hours or a sustained release dose in the last 72 hours Megestrol (Megace) in the last 5 days  Cimetidine (Tagamet) in the last 6 hours Ketoconazole (Nizoral) in the last 24 hours Itraconazole (Sporanox) in the last 48 hours  Prochlorperazine (Compazine) in the last 36 hours     []   [x]   Patient is known to have a history of torsades de pointes; congenital or acquired long QT syndromes.    []   [x]   Patient has received a Class 1 and Class 3 antiarrhythmic with less than 2 half-lives since last dose. (Disopyramide, Quinidine, Procainamide, Lidocaine , Mexiletine, Flecainide, Propafenone, Sotalol, Dronedarone)    []   [x]   Patient has received amiodarone therapy in the past 3 months or amiodarone level is greater than 0.3 ng/ml.    Labs:    Component Value Date/Time   K 3.7 09/13/2024 1646   MG 2.0 09/13/2024 1646     Plan: Patient has been appropriately anticoagulated with warfarin (INR 1.9).  Potassium: K 3.5-3.7:  Hold Tikosyn  initiation and give KCl 60 mEq po x1 and repeat BMET 2hr after dose - repeat appropriate dose if K < 4    ADDENDUM (2040) Pt is refusing oral potassium replacement. Contacted on call MD who is okay with doing 40mEq IV. Will recheck K ~2330 in hopes to give Tikosyn  tonight. If K remains < 4, likely will not get next dose until 11/22 a.m.  Magnesium : Mg 1.8-2: Give Mg 2 gm  IV x1 to prevent Mg from dropping below 1.8 - do not need to recheck Mg. Appropriate to initiate Tikosyn    Thank you for allowing pharmacy to participate in this patient's care   Vito Ralph, PharmD, BCPS Please see amion for complete clinical pharmacist phone list 09/13/2024  8:42 PM

## 2024-09-13 NOTE — H&P (Addendum)
 History and Physical    Patient: [name] FMW:[id number] DOB: 11/14/1945 DOA: 09/12/2024 DOS: the patient was seen and examined on 09/13/2024 PCP: Domenica Harlene LABOR, MD  Patient coming from: tras  Chief Complaint:  Chief Complaint  Patient presents with   Shortness of Breath   HPI: EMMALIE [name] is a 78 y.o. female with medical history significant of hypertension, hyperlipidemia, CAD, paroxysmal atrial fibrillation, rheumatoid arthritis, and colitis presents with shortness of breath and very high blood pressure. She is accompanied by her husband.  She has been experiencing shortness of breath and very high blood pressure for several days. Initially, she attempted to manage her symptoms with home remedies. During a regular visit on October 4th, her blood pressure was noted to be too high, leading to an adjustment in her medication regimen.  Her current medications for blood pressure include valsartan  160 mg twice daily, doxazosin , and amlodipine . Two new medications were added by a provider at the [facility] building, bringing her total to four pills in the morning and three or four at night.  No swelling in her ankles, but she has difficulty walking short distances without becoming breathless. She was placed on oxygen in the emergency room and uses a CPAP machine at home during the night.  She has a history of seeing Dr. Pietro and Dr. Barbaraann of cardiology.  In the emergency department patient was noted to be afebrile with tachypnea, blood pressures elevated up to 215/76, and O2 saturations initially noted as low as 88% on room air for which she was placed on BiPAP temporarily prior to being weaned down to 2 L nasal cannula oxygen.  Labs from 11/20 significant for BNP 1773, high-sensitivity troponin 26, hemoglobin 11, BUN 17, and creatinine 1.2.  Venous pH noted CO2 within normal limits.  Chest x-ray noted stable cardiomegaly with minimal interstitial opacities concerning for mild  pulmonary edema and small bilateral pleural effusions.  CT angiogram of the chest was obtained which noted no evidence of aortic dissection or aneurysm and small to moderate-sized bilateral pleural effusions with compressive atelectasis, mild cardiomegaly, and he does not have lymphadenopathy increased from prior to be likely reactive in nature.  Patient has been given Lasix  40 mg IV x 1, clonidine  0.1 mg p.o., hydralazine  IV, and DuoNeb breathing treatment.  Review of Systems: As mentioned in the history of present illness. All other systems reviewed and are negative.  Past Medical History:  Diagnosis Date   Allergy    Anxiety    Arthritis    Atrial fibrillation (HCC) 2008   Blood transfusion without reported diagnosis    CAD (coronary artery disease)    Crohn's colitis (HCC)    she reports ulcerative colitis beginning in her 34's, biopsies 2008 suggest Crohn's not UC   Depression    Diverticulosis    GERD (gastroesophageal reflux disease)    Hyperlipidemia    Hypertension    IBS (irritable bowel syndrome)    Keloid of skin    Obesity    OSA (obstructive sleep apnea)    Pancreatitis    Paroxysmal atrial fibrillation (HCC)    Pseudoaneurysm of right femoral artery    Renal oncocytoma    s/p right nephrectomy   Rheumatoid arthritis (HCC) 05/29/2017   Sleep apnea    UC (ulcerative colitis) [facility])    Past Surgical History:  Procedure Laterality Date   ANGIOPLASTY     BREAST BIOPSY  10/24/1994   benign lesion    Cataract surgery Left  11/13/2014   CHOLECYSTECTOMY  10/24/2006   COLONOSCOPY W/ BIOPSIES     CORONARY ANGIOPLASTY     ESOPHAGOGASTRODUODENOSCOPY     EYE SURGERY     NEPHRECTOMY  10/24/2006   right secondary to oncocytoma    RIGHT FEMORAL  PSEUDOANEURYSM & RIGHT GROIN HEMATOMA EVACUATION  02/22/2007   RIGHT RENAL ARTERY REPAIR  10/24/1974   TUBAL LIGATION     Social History:  reports that she has never smoked. She has never used smokeless tobacco. She reports that  she does not drink alcohol and does not use drugs.  Allergies  Allergen Reactions   Hydralazine  Diarrhea and Nausea Only    Anorexia, upset stomach, burning pain in abdomin   Cefdinir      Pt cannot take; may interfere with AFib.   Codeine     Heart beats fast    Fosamax [Alendronate Sodium] Other (See Comments)    GI upset   Gluten Meal    Guaifenesin Er Nausea And Vomiting and Other (See Comments)    Headaches; can tolerate liquid.   Latex Other (See Comments)    Breathing problems   Percocet [Oxycodone -Acetaminophen ]     Itching & swelling in jaw Pt said she can take tylenol    Psyllium Other (See Comments)    Family History  Problem Relation Age of Onset   Hypertension Mother    Coronary artery disease Mother    Stroke Mother    Clotting disorder Mother    Heart disease Mother    Allergies Mother    Colitis Father    Heart disease Father    Clotting disorder Sister    Stroke Brother    Clotting disorder Brother    Cancer Daughter    Asthma Daughter    Asthma Maternal Uncle    Colon cancer Paternal Aunt    Cancer Daughter    Colon polyps Neg Hx    Kidney disease Neg Hx    Stomach cancer Neg Hx    Esophageal cancer Neg Hx     Prior to Admission medications   Medication Sig Start Date End Date Taking? Authorizing Provider  amLODipine  (NORVASC ) 10 MG tablet TAKE 1 TABLET BY MOUTH EVERY DAY 12/22/23   Carlin Delon BROCKS, NP  carvedilol  (COREG ) 6.25 MG tablet Take 1 tablet (6.25 mg total) by mouth 2 (two) times daily. 08/27/24   Daneen Damien BROCKS, NP  clobetasol ointment (TEMOVATE) 0.05 % Apply 1 Application topically 2 (two) times daily. Patient taking differently: Apply 1 Application topically as needed. 11/24/23   [provider]  cloNIDine  (CATAPRES ) 0.1 MG tablet Take 1 tablet (0.1 mg total) by mouth 2 (two) times daily. 09/10/24   Wheeler Harlene CROME, NP  dextrose 5 % SOLN 50 mL with methotrexate 25 MG/ML (PF) SOLN 40 mg/m2 Inject 40 mg/m2 into the vein once  a week. Every wednesday    [provider]  dofetilide  (TIKOSYN ) 125 MCG capsule TAKE 3 CAPSULES BY MOUTH TWICE A DAY 11/20/23   Pietro Redell RAMAN, MD  doxazosin  (CARDURA ) 2 MG tablet TAKE 1 TABLET BY MOUTH EVERY DAY 12/22/23   Carlin Delon BROCKS, NP  escitalopram  (LEXAPRO ) 10 MG tablet Take 1 tablet (10 mg total) by mouth daily. 08/02/24   Domenica Harlene LABOR, MD  esomeprazole  (NEXIUM ) 40 MG capsule Take 1 capsule (40 mg total) by mouth daily. Take in the morning before breakfast. 01/15/18   Esterwood, Amy S, PA-C  folic acid  (FOLVITE ) 1 MG tablet Take 2 mg by  mouth daily. Patient taking differently: Take 1 mg by mouth 2 (two) times daily. 09/25/19   [provider]  furosemide  (LASIX ) 40 MG tablet Take 1 tablet for 3 days and hold 08/27/24   Daneen Damien BROCKS, NP  hydrocortisone  (ANUSOL -HC) 25 MG suppository Place 1 suppository (25 mg total) rectally 2 (two) times daily as needed for hemorrhoids or anal itching. 04/17/24   Avram Lupita BRAVO, MD  inFLIXimab  10 mg/kg in sodium chloride  0.9 % Inject 10 mg/kg into the vein every 6 (six) weeks.    [provider]  isosorbide  mononitrate (IMDUR ) 30 MG 24 hr tablet Take 1 tablet (30 mg total) by mouth daily. Patient not taking: Reported on 09/10/2024 09/03/24 12/08/24  Barbaraann Darryle Ned, MD  loperamide  (IMODIUM  A-D) 2 MG tablet Take 1 mg by mouth as needed. Dihrrhea    [provider]  Multiple Vitamin (MULTIVITAMIN) capsule Take 1 capsule by mouth daily. 1 daily    [provider]  NEOMYCIN -POLYMYXIN-HYDROCORTISONE  (CORTISPORIN) 1 % SOLN OTIC solution PLACE 3 DROPS INTO THE LEFT EAR 3 TIMES DAILY. 08/02/24   Webb, Padonda B, FNP  nitroGLYCERIN  (NITROSTAT ) 0.4 MG SL tablet Place 1 tablet (0.4 mg total) under the tongue every 5 (five) minutes as needed for chest pain. Do not exceed 3 tabs in 15 minutes 08/20/18   Pietro Redell RAMAN, MD  Probiotic Product [facility]ON [facility]) CAPS Take 1 capsule by mouth daily. 1 per day     [provider]  spironolactone  (ALDACTONE ) 25 MG tablet Take 0.5 tablets (12.5 mg total) by mouth daily. 08/27/24   Daneen Damien BROCKS, NP  TUBERCULIN SYR 1CC/27GX1/2 27G X 1/2 1 ML MISC     [provider]  valsartan  (DIOVAN ) 160 MG tablet TAKE 1 AND 1/2 TABLETS ONCE DAILY Patient taking differently: Take 160 mg by mouth 2 (two) times daily. TAKE 1 TWICE A DAY 07/24/24   Pietro Redell RAMAN, MD  valsartan  (DIOVAN ) 80 MG tablet Take 1 tablet (80 mg total) by mouth daily. 07/31/24   Pietro Redell RAMAN, MD  warfarin (COUMADIN ) 2.5 MG tablet TAKE 1/2 TABLET TO 1 TABLET BY MOUTH DAILY AS DIRECTED BY COUMADIN  CLINIC 07/05/24   Pietro Redell RAMAN, MD    Physical Exam: Vitals:   09/13/24 0930 09/13/24 1000 09/13/24 1113 09/13/24 1117  BP: (!) 159/59  (!) 157/48   Pulse: 65  66   Resp: (!) 23  19   Temp:  98.5 F (36.9 C) 97.8 F (36.6 C)   TempSrc:  Oral Oral   SpO2: 97%  96%   Weight:      Height:    5' 3 (1.6 m)     Constitutional: Elderly female currently in no acute distress Eyes: PERRL, lids and conjunctivae normal ENMT: Mucous membranes are moist.  Hard of hearing.Normal dentition.  Neck: normal, supple,   Respiratory: Decreased aeration.  No significant wheezes, rhonchi, or crackles appreciated.  Currently on 2 L nasal cannula oxygen with O2 saturations maintained Cardiovascular: Regular rate and rhythm, no murmurs / rubs / gallops.  1+ pitting bilateral lower extremity edema present. Abdomen: no tenderness, no masses palpated.  Bowel sounds positive.  Musculoskeletal: no clubbing / cyanosis. No joint deformity upper and lower extremities. Good ROM, no contractures. Normal muscle tone.  Skin: no rashes, lesions, ulcers. No induration Neurologic: CN 2-12 grossly intact. Strength 5/5 in all 4.  Psychiatric: Normal judgment and insight. Alert and oriented x 3. Normal mood.   Data Reviewed:  EKG  reveals sinus rhythm at 69 bpm.  Reviewed labs, imaging, and pertinent  records as documented.  Assessment and Plan:  Acute respiratory failure with hypoxia secondary to diastolic congestive heart failure exacerbation Patient presented with complaints of shortness of breath and report of increased swelling over the last several days.  Initially noted to have O2 saturations as low as 88% on room air.  She was placed temporarily on BiPAP, but was able to be transition to 2 L nasal cannula ox on physical exam patient with at least 1+ pitting lower extremity edema.  Chest x-ray revealed minimal interstitial opacities bilaterally concerning for Neri edema and small bilateral pleural effusions.  BNP elevated 1773.  Last echocardiogram noted EF to be around 73% with grade 2 diastolic dysfunction when checked 08/2023.  Patient had been given Lasix  40 mg IV x 1 dose. - Admit to a progressive bed - Continuous pulse oximetry with oxygen maintain O2 saturation greater than 92% - Wean to room air when outside - Strict I&Os and daily weights  - Check echocardiogram - Continue Lasix  40 mg IV twice daily - Continue goal-directed medical therapy as tolerated Physical therapy to eval and treat in a.m.-  - Cardiology consulted we will follow-up for any further recommendation  Hypertensive urgency Blood pressures initially elevated up to 215/76.  Patient reports issues with blood pressure at home prior to arrival - Continue clonidine , Coreg , amlodipine , and pharmacy substitution for valsartan  - Continue hydralazine  as needed  Elevated troponin High-sensitivity troponin is mildly elevated at 26.  Thought secondary to demand. - Continue to monitor  Paroxysmal atrial fibrillation chronic anticoagulation Patient appears to be in a sinus rhythm.  INR subtherapeutic at 1.8.  INR goal 2-3. - Continue Coumadin  per pharmacy - Continue Tikosyn  and Coreg   Rheumatoid arthritis Patient on methotrexate and infliximab  in the outpatient setting. - Continue outpatient follow-up  Morbid  obesity BMI 43.4 kg/m  DVT Prophylaxis: Coumadin  Advance Care Planning:   Code Status: Full Code    Consults: Cardiology  Family Communication: Husband updated at bedside  Severity of Illness: The appropriate patient status for this patient is INPATIENT. Inpatient status is judged to be reasonable and necessary in order to provide the required intensity of service to ensure the patient's safety. The patient's presenting symptoms, physical exam findings, and initial radiographic and laboratory data in the context of their chronic comorbidities is felt to place them at high risk for further clinical deterioration. Furthermore, it is not anticipated that the patient will be medically stable for discharge from the hospital within 2 midnights of admission.   * I certify that at the point of admission it is my clinical judgment that the patient will require inpatient hospital care spanning beyond 2 midnights from the point of admission due to high intensity of service, high risk for further deterioration and high frequency of surveillance required.*  Author: Maximino DELENA Sharps, MD 09/13/2024 12:22 PM  For on call review [email or web address].

## 2024-09-13 NOTE — ED Notes (Signed)
 Called [facility] for transport to [facility] @9 :28.

## 2024-09-13 NOTE — Progress Notes (Signed)
 Heart Failure Navigator Progress Note  Assessed for Heart & Vascular TOC clinic readiness.   Patient does not meet criteria due to [facility] appt 12/5. EF 65-70%. Patient reports she is unable to come any earlier for follow up.  Navigator available for reassessment of patient.   Duwaine Plant, PharmD, BCPS Heart Failure Stewardship Pharmacist Phone [phone]

## 2024-09-13 NOTE — ED Notes (Signed)
 Patient is comfortable on her [facility] and eating breakfast. Patient wears CPAP at bedtime so order placed per protocol. Will send mask with her to [facility]

## 2024-09-13 NOTE — Consult Note (Addendum)
 As below, patient seen and examined.  Briefly she is a 78 year old female with past medical history of paroxysmal atrial fibrillation, hypertension, nonobstructive coronary artery disease, previous right nephrectomy, heart failure preserved ejection fraction for evaluation of acute on chronic heart failure and hypertension.  Patient states her blood pressure has been high over the past 6 weeks.  She has developed progressive dyspnea on exertion, bilateral lower extremity edema.  She denies chest pain, palpitations, bleeding or syncope.  Her dyspnea worsened to the point that she came to the emergency room and required transient BiPAP.  She feels improved following IV Lasix .  Cardiology now asked to evaluate.  Patient is volume overloaded on examination.  Echocardiogram this admission shows normal LV function, mild left ventricular hypertrophy, moderate left atrial enlargement.  CTA shows no dissection or aneurysm, bilateral pleural effusions, mediastinal lymphadenopathy increased from previous.  Creatinine 1.2, potassium 4, Mg 2.2. BNP 1773, hemoglobin 9.7, INR 1.9.  ECG shows sinus rhythm with normal QT interval.  1 acute on chronic diastolic congestive heart failure-patient is volume overloaded on examination.  Will treat with Lasix  40 mg IV twice daily and follow renal function.  Add Jardiance  10 mg daily.  Resume spironolactone  12.5 mg daily.  2 hypertension-patient's blood pressure has been significantly elevated.  This is relatively recent.  However CTA shows no renal artery stenosis on the left.  She has had previous right nephrectomy.  We will increase carvedilol  to 12.5 mg twice daily, continue ARB, amlodipine  and add spironolactone  12.5 mg daily.  She will also likely need low-dose diuretic at discharge.  Follow blood pressure and adjust medications as needed.  3 paroxysmal atrial fibrillation-patient is in sinus rhythm.  Will continue carvedilol , dofetilide  and Coumadin .  4 chronic stage IIIa  kidney disease-patient has had previous nephrectomy.  Follow renal function closely with diuresis and note she has also had contrast from CT performed in the emergency room.  5 coronary artery disease-patient denies chest pain.  She is intolerant to statins.  6 hyperlipidemia-intolerant to statins and previously declined all of her lipid-lowering medications.  [name] Shallow, MD     Cardiology Consultation   Patient ID: [name] MRN: [id number]; DOB: 1946-09-14  Admit date: 09/12/2024 Date of Consult: 09/13/2024  PCP:  Domenica Harlene LABOR, MD   [facility] Providers Cardiologist:  [name] Shallow, MD  Electrophysiologist:  Soyla Gladis Norton, MD     Patient Profile: [name] is a 78 y.o. female with a hx of HFpEF,  paroxsymal atrial fibrillation, HTN, obesity, nonobstructive CAD, right nephrectomy who is being seen 09/13/2024 for the evaluation of CHF at the request of Dr. Claudene.  History of Present Illness: [name] is a 78 yo female with PMH noted above. She has been followed by Dr. Shallow as an outpatient. Remote MI 2/2 to a 1st diag occlusion. Underwent cardiac cath 2008 with non-obstructive CAD in the LAD and LCx and normal RCA. MRA of abd in 2012 with no left renal artery stenosis and prior right nephrectomy. Carotid dopplers 11/21 1-39 bilateral stenosis. Echo 08/2023 showed normal LVEF, mild LVH, g2DD, moderate right ventricular enlargement, severe LAE and mild RAE. Cardiac monitor from 02/2024 with 5 beats of NSVT. Seen in the office on 07/24/2024 and continued on Tikosyn , coumadin , amlodipine  10mg  daily, valsartan  240mg  daily, statin intolerant.   Seen in follow up 11/10 by Dr. Burton after a recent for urinary retention on 11/4 after her lasix  dosage was reduced at that time. At this visit,  she reports poor dietary discretion, eating at [facility] and [employer] frequently. BP was elevated and hydralazine  50mg  TID added. Later switched to Imdur  given hx  of hydralazine  intolerance.   Seen by PCP and BP remained elevated, clonidine  0.1mg  BID added. Outpatient renal dopplers ordered.   Presented to the ED with complaints of shortness of breath and worsening LE edema for several days. Reports she has been trying to do better with her diet at the home. Had not been eating out much. Does not take a diuretic on a daily basis. No chest pain or palpitations prior to admission. Has been complaint with her meds, but stopped the Imdur  after a recent UC visit where she was told not to take it?  Labs showed Na 141, K 4.0, Cr 1.20, ProBNP 1773, hsTn 26, WBC 6.1, Hgb 11. CT angio c/a/p small to moderate bilateral pleural effusions with atelectasis, mediastinal lymphadenopathy increaed from prior imaging, small amount of air in bladder. EKG sinus rhythm 69bpm. Required Bipap in the ED. Hypertensive with systolic BP >200 on admission. Admitted to IM and cardiology asked to evaluate. Now weaned to [facility] @2L , breathing has improved.    Past Medical History:  Diagnosis Date   Allergy    Anxiety    Arthritis    Atrial fibrillation (HCC) 2008   Blood transfusion without reported diagnosis    CAD (coronary artery disease)    Crohn's colitis (HCC)    she reports ulcerative colitis beginning in her 64's, biopsies 2008 suggest Crohn's not UC   Depression    Diverticulosis    GERD (gastroesophageal reflux disease)    Hyperlipidemia    Hypertension    IBS (irritable bowel syndrome)    Keloid of skin    Obesity    OSA (obstructive sleep apnea)    Pancreatitis    Paroxysmal atrial fibrillation (HCC)    Pseudoaneurysm of right femoral artery    Renal oncocytoma    s/p right nephrectomy   Rheumatoid arthritis (HCC) 05/29/2017   Sleep apnea    UC (ulcerative colitis) (HCC)     Past Surgical History:  Procedure Laterality Date   ANGIOPLASTY     BREAST BIOPSY  10/24/1994   benign lesion    Cataract surgery Left 11/13/2014   CHOLECYSTECTOMY  10/24/2006    COLONOSCOPY W/ BIOPSIES     CORONARY ANGIOPLASTY     ESOPHAGOGASTRODUODENOSCOPY     EYE SURGERY     NEPHRECTOMY  10/24/2006   right secondary to oncocytoma    RIGHT FEMORAL  PSEUDOANEURYSM & RIGHT GROIN HEMATOMA EVACUATION  02/22/2007   RIGHT RENAL ARTERY REPAIR  10/24/1974   TUBAL LIGATION      Scheduled Meds:  amLODipine   10 mg Oral Daily   carvedilol   6.25 mg Oral BID   cloNIDine   0.1 mg Oral BID   fentaNYL  (SUBLIMAZE ) injection  50 mcg Intravenous Once   furosemide   40 mg Intravenous BID   irbesartan   300 mg Oral Daily   sodium chloride  flush  3 mL Intravenous Q12H   [START ON 09/15/2024] warfarin  1.25 mg Oral Once per day on Sunday Tuesday Thursday   And   warfarin  2.5 mg Oral Once per day on Monday Wednesday Friday Saturday   Warfarin - Pharmacist Dosing Inpatient   Does not apply q1600   Continuous Infusions:  PRN Meds: acetaminophen  **OR** acetaminophen , ipratropium-albuterol   Allergies:    Allergies  Allergen Reactions   Hydralazine  Diarrhea and Nausea Only    Anorexia, upset  stomach, burning pain in abdomin   Cefdinir      Pt cannot take; may interfere with AFib.   Codeine     Heart beats fast    Fosamax [Alendronate Sodium] Other (See Comments)    GI upset   Gluten Meal    Guaifenesin Er Nausea And Vomiting and Other (See Comments)    Headaches; can tolerate liquid.   Latex Other (See Comments)    Breathing problems   Percocet [Oxycodone -Acetaminophen ]     Itching & swelling in jaw Pt said she can take tylenol    Psyllium Other (See Comments)    Social History:   Social History   Socioeconomic History   Marital status: Married    Spouse name: Not on file   Number of children: 3   Years of education: Not on file   Highest education level: 12th grade  Occupational History   Occupation: Retired    Associate Professor: RETIRED    Comment: retired  Tobacco Use   Smoking status: Never   Smokeless tobacco: Never   Tobacco comments:    Never smoked  03/06/24  Vaping Use   Vaping status: Never Used  Substance and Sexual Activity   Alcohol use: No   Drug use: No   Sexual activity: Not Currently    Birth control/protection: Condom, Other-see comments    Comment: Tbal ligation  Other Topics Concern   Not on file  Social History Narrative   Married since 1965 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, 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 - adults, 2 grandchildrenNo pets -Daughter has breast cancer   Social Drivers of Corporate Investment Banker Strain: Low Risk  (06/08/2024)   Overall Financial Resource Strain (CARDIA)    Difficulty of Paying Living Expenses: Not hard at all  Food Insecurity: No Food Insecurity (09/13/2024)   Hunger Vital Sign    Worried About Running Out of Food in the Last Year: Never true    Ran Out of Food in the Last Year: Never true  Transportation Needs: No Transportation Needs (09/13/2024)   PRAPARE - Administrator, Civil Service (Medical): No    Lack of Transportation (Non-Medical): No  Physical Activity: Patient Declined (06/08/2024)   Exercise Vital Sign    Days of Exercise per Week: Patient declined    Minutes of Exercise per Session: Patient declined  Stress: No Stress Concern Present (06/08/2024)   [employer] of Occupational Health - Occupational Stress Questionnaire    Feeling of Stress: Not at all  Social Connections: Unknown (09/13/2024)   Social Connection and Isolation Panel    Frequency of Communication with Friends and Family: More than three times a week    Frequency of Social Gatherings with Friends and Family: Three times a week    Attends Religious Services: Patient declined    Active Member of Clubs or Organizations: Patient declined    Attends Banker Meetings: Patient declined    Marital Status: Married  Catering Manager Violence: Not At Risk (09/13/2024)   Humiliation, Afraid, Rape, and Kick questionnaire    Fear of Current or Ex-Partner: No    Emotionally  Abused: No    Physically Abused: No    Sexually Abused: No    Family History:    Family History  Problem Relation Age of Onset   Hypertension Mother    Coronary artery disease Mother    Stroke Mother    Clotting disorder Mother    Heart disease Mother    Allergies  Mother    Colitis Father    Heart disease Father    Clotting disorder Sister    Stroke Brother    Clotting disorder Brother    Cancer Daughter    Asthma Daughter    Asthma Maternal Uncle    Colon cancer Paternal Aunt    Cancer Daughter    Colon polyps Neg Hx    Kidney disease Neg Hx    Stomach cancer Neg Hx    Esophageal cancer Neg Hx      ROS:  Please see the history of present illness.   All other ROS reviewed and negative.     Physical Exam/Data: Vitals:   09/13/24 0930 09/13/24 1000 09/13/24 1113 09/13/24 1117  BP: (!) 159/59  (!) 157/48   Pulse: 65  66   Resp: (!) 23  19   Temp:  98.5 F (36.9 C) 97.8 F (36.6 C)   TempSrc:  Oral Oral   SpO2: 97%  96%   Weight:      Height:    5' 3 (1.6 m)    Intake/Output Summary (Last 24 hours) at 09/13/2024 1412 Last data filed at 09/13/2024 0023 Gross per 24 hour  Intake --  Output 900 ml  Net -900 ml      09/12/2024    2:34 PM 09/10/2024    3:39 PM 09/02/2024   10:26 AM  Last 3 Weights  Weight (lbs) 245 lb 246 lb 3.2 oz 247 lb  Weight (kg) 111.131 kg 111.676 kg 112.038 kg     Body mass index is 43.4 kg/m.  General:  Obese older female, [place] @2L  HEENT: normal Neck: difficult to assess JVD 2/2 neck girth Vascular: No carotid bruits; Distal pulses 2+ bilaterally Cardiac:  normal S1, S2; RRR; soft systolic murmur  Lungs: Diminished in bases  Abd: soft, nontender, no hepatomegaly  Ext: 1-2+ LE edema Musculoskeletal:  No deformities, BUE and BLE strength normal and equal Skin: warm and dry  Neuro: no focal abnormalities noted Psych:  Normal affect   EKG:  The EKG was personally reviewed and demonstrates:  Sinus rhythm 68 bpm Telemetry:   Telemetry was personally reviewed and demonstrates:  sinus rhythm  Relevant CV Studies:  Echo: 08/2023  IMPRESSIONS     1. Left ventricular ejection fraction, by estimation, is 70 to 75%. Left  ventricular ejection fraction by 3D volume is 73 %. The left ventricle has  hyperdynamic function. The left ventricle has no regional wall motion  abnormalities. There is mild  concentric left ventricular hypertrophy. Left ventricular diastolic  parameters are consistent with Grade II diastolic dysfunction  (pseudonormalization). Elevated left ventricular end-diastolic pressure.  The average left ventricular global longitudinal  strain is -17.4 %. The global longitudinal strain is normal.   2. Right ventricular systolic function is normal. The right ventricular  size is moderately enlarged. There is mildly elevated pulmonary artery  systolic pressure. The estimated right ventricular systolic pressure is  44.0 mmHg.   3. Left atrial size was severely dilated.   4. Right atrial size was mildly dilated.   5. The mitral valve is normal in structure. Trivial mitral valve  regurgitation. No evidence of mitral stenosis.   6. The aortic valve is tricuspid. Aortic valve regurgitation is not  visualized. Aortic valve sclerosis/calcification is present, without any  evidence of aortic stenosis. Aortic valve area, by VTI measures 2.35 cm.  Aortic valve mean gradient measures  8.0 mmHg. Aortic valve Vmax measures 1.75 m/s.   7.  The inferior vena cava is normal in size with greater than 50%  respiratory variability, suggesting right atrial pressure of 3 mmHg.   FINDINGS   Left Ventricle: Left ventricular ejection fraction, by estimation, is 70  to 75%. Left ventricular ejection fraction by 3D volume is 73 %. The left  ventricle has hyperdynamic function. The left ventricle has no regional  wall motion abnormalities. The  average left ventricular global longitudinal strain is -17.4 %. The global   longitudinal strain is normal. The left ventricular internal cavity size  was normal in size. There is mild concentric left ventricular hypertrophy.  Left ventricular diastolic  parameters are consistent with Grade II diastolic dysfunction  (pseudonormalization). Elevated left ventricular end-diastolic pressure.   Right Ventricle: The right ventricular size is moderately enlarged. No  increase in right ventricular wall thickness. Right ventricular systolic  function is normal. There is mildly elevated pulmonary artery systolic  pressure. The tricuspid regurgitant  velocity is 3.20 m/s, and with an assumed right atrial pressure of 3 mmHg,  the estimated right ventricular systolic pressure is 44.0 mmHg.   Left Atrium: Left atrial size was severely dilated.   Right Atrium: Right atrial size was mildly dilated.   Pericardium: There is no evidence of pericardial effusion.   Mitral Valve: The mitral valve is normal in structure. Trivial mitral  valve regurgitation. No evidence of mitral valve stenosis.   Tricuspid Valve: The tricuspid valve is normal in structure. Tricuspid  valve regurgitation is mild . No evidence of tricuspid stenosis.   Aortic Valve: The aortic valve is tricuspid. Aortic valve regurgitation is  not visualized. Aortic valve sclerosis/calcification is present, without  any evidence of aortic stenosis. Aortic valve mean gradient measures 8.0  mmHg. Aortic valve peak gradient  measures 12.2 mmHg. Aortic valve area, by VTI measures 2.35 cm.   Pulmonic Valve: The pulmonic valve was normal in structure. Pulmonic valve  regurgitation is not visualized. No evidence of pulmonic stenosis.   Aorta: The aortic root is normal in size and structure.   Venous: The inferior vena cava is normal in size with greater than 50%  respiratory variability, suggesting right atrial pressure of 3 mmHg.   IAS/Shunts: No atrial level shunt detected by color flow Doppler.   Laboratory  Data: High Sensitivity Troponin:  No results for input(s): TROPONINIHS in the last 720 hours.   Chemistry Recent Labs  Lab 09/12/24 1437 09/12/24 1745  NA 141 141  K 4.0 3.8  CL 108  --   CO2 22  --   GLUCOSE 101*  --   [name] 17  --   CREATININE 1.20*  --   CALCIUM  8.9  --   GFRNONAA 46*  --   ANIONGAP 11  --     Recent Labs  Lab 09/12/24 1437  PROT 7.0  ALBUMIN 3.6  AST 31  ALT 17  ALKPHOS 76  BILITOT 0.4   Lipids No results for input(s): CHOL, TRIG, HDL, LABVLDL, LDLCALC, CHOLHDL in the last 168 hours.  Hematology Recent Labs  Lab 09/12/24 1437 09/12/24 1745 09/13/24 0522  WBC 6.1  --  6.2  RBC 3.38*  --  3.02*  HGB 11.0* 10.9* 9.7*  HCT 34.0* 32.0* 30.5*  MCV 100.6*  --  101.0*  MCH 32.5  --  32.1  MCHC 32.4  --  31.8  RDW 15.9*  --  16.0*  PLT 187  --  180   Thyroid  No results for input(s): TSH, FREET4 in the last  168 hours.  BNP Recent Labs  Lab 09/12/24 1437  PROBNP 1,773.0*    DDimer No results for input(s): DDIMER in the last 168 hours.  Radiology/Studies:  CT Angio Chest/Abd/Pel for Dissection W and/or Wo Contrast Result Date: 09/12/2024 CLINICAL DATA:  Acute aortic syndrome suspected. Shortness of breath. EXAM: CT ANGIOGRAPHY CHEST, ABDOMEN AND PELVIS TECHNIQUE: Noncontrast chest CT was performed. Multidetector CT imaging through the chest, abdomen and pelvis was performed using the standard protocol during bolus administration of intravenous contrast. Multiplanar reconstructed images and MIPs were obtained and reviewed to evaluate the vascular anatomy. RADIATION DOSE REDUCTION: This exam was performed according to the departmental dose-optimization program which includes automated exposure control, adjustment of the mA and/or kV according to patient size and/or use of iterative reconstruction technique. CONTRAST:  OMNIPAQUE  IOHEXOL  350 MG/ML SOLN COMPARISON:  CT angiogram chest abdomen and pelvis 09/06/2023. FINDINGS: CTA  CHEST FINDINGS Cardiovascular: Preferential opacification of the thoracic aorta. No evidence of thoracic aortic aneurysm or dissection. The heart is mildly enlarged. No pericardial effusion. The left vertebral artery arises from the aortic arch. Mediastinum/Nodes: There are nonenlarged and enlarged paratracheal lymph nodes which have increased in size and number measuring up to 14 mm. Nonenlarged subcarinal, right hilar, and prevascular lymph nodes have increased in number when compared to the prior study. Visualized thyroid  gland and esophagus are within normal limits. Lungs/Pleura: There are small to moderate-sized bilateral pleural effusions. There is compressive atelectasis in the bilateral lower lobes. The lungs are otherwise clear. There are few calcified granulomas in right lung. Musculoskeletal: No chest wall abnormality. No acute or significant osseous findings. Review of the MIP images confirms the above findings. CTA ABDOMEN AND PELVIS FINDINGS VASCULAR Aorta: Normal caliber aorta without aneurysm, dissection, vasculitis or significant stenosis. There are atherosclerotic calcifications of the aorta. Celiac: Patent without evidence of aneurysm, dissection, vasculitis or significant stenosis. SMA: Patent without evidence of aneurysm, dissection, vasculitis or significant stenosis. Renals: Right nephrectomy changes present. Left renal artery within normal limits. IMA: Patent without evidence of aneurysm, dissection, vasculitis or significant stenosis. Inflow: Patent without evidence of aneurysm, dissection, vasculitis or significant stenosis. Calcified atherosclerotic disease present. Veins: No obvious venous abnormality within the limitations of this arterial phase study. Review of the MIP images confirms the above findings. NON-VASCULAR Hepatobiliary: No focal liver abnormality is seen. Status post cholecystectomy. No biliary dilatation. Pancreas: Unremarkable. No pancreatic ductal dilatation or  surrounding inflammatory changes. Spleen: Normal in size without focal abnormality. 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 Bilateral adrenal glands and left kidney are within normal limits. The bladder is decompressed. There is a small amount of air in the bladder. Stomach/Bowel: Stomach is within normal limits. Appendix appears normal. No evidence of bowel wall thickening, distention, or inflammatory changes. There scattered colonic diverticula. Lymphatic: No enlarged lymph nodes are seen. Reproductive: Uterus and bilateral adnexa are unremarkable. Other: No abdominal wall hernia or abnormality. No abdominopelvic ascites. Musculoskeletal: No acute or significant osseous findings. Review of the MIP images confirms the above findings. IMPRESSION: 1. No evidence for aortic dissection or aneurysm. 2. Small to moderate-sized bilateral pleural effusions with compressive atelectasis in the lower lobes. 3. Mild cardiomegaly. 4. Mediastinal lymphadenopathy, increased from prior. Findings may be reactive, but metastatic disease can not be excluded. 5. Small amount of air in the bladder. 6. Colonic diverticulosis. 7. Aortic atherosclerosis. Aortic Atherosclerosis (ICD10-I70.0). Electronically Signed   By: Greig Pique M.D.   On: 09/12/2024 18:09   DG Chest 2 View Result Date:  09/12/2024 EXAM: 2 VIEW(S) XRAY OF THE CHEST 09/12/2024 03:30:31 PM COMPARISON: 09/10/2023 CLINICAL HISTORY: shortness of breath FINDINGS: LUNGS AND PLEURA: Minimal interstitial densities are noted bilaterally suggesting possible pulmonary edema. Small pleural effusions are noted. Minimal left basilar atelectasis. No pneumothorax. HEART AND MEDIASTINUM: Stable cardiomegaly. BONES AND SOFT TISSUES: No acute osseous abnormality. IMPRESSION: 1. Stable cardiomegaly. 2. Minimal interstitial opacities bilaterally, compatible with mild pulmonary edema. 3. Small bilateral pleural effusions. 4. Minimal left basilar atelectasis.  Electronically signed by: Lynwood Seip MD 09/12/2024 03:42 PM EST RP Workstation: [id number]     Assessment and Plan:  RASHEDA LEDGER is a 78 y.o. female with a hx of HFpEF,  paroxsymal atrial fibrillation, HTN, obesity, nonobstructive CAD who is being seen 09/13/2024 for the evaluation of CHF at the request of Dr. Claudene.  Acute on Chronic HFpEF -- presented with worsening shortness of breath over the past several days with weight gain. Not on standing diuretic with tikosyn . Reports compliance with meds and has gotten better with her diet. BP has been high despite medication adjustments -- proBNP 1773, CT c/a/p with bilateral small to moderate pleural effusions -- s/p IV lasix  40mg  x1, close to 1L UOP and reports breathing better -- continue IV lasix  40mg  BID -- repeat echo pending -- GDMT: has had issues with medication intolerance in the past, will trial jardiance . Increase coreg  12.5mg  BID, amlodipine  10mg  daily, spiro 12.5mg  daily, irbesartan  300mg  daily   HTN Hx of right nephrectomy  -- poorly controlled, hx of resistant HTN -- systolic elevated >200 on admission -- continue increase coreg  12.5 mg BID, irbesartan  300mg  daily amlodipine  10mg  daily and clonidine  0.1mg  BID. Resume spiro 12.5mg  daily  Paroxsymal atrial fibrillation -- maintaining sinus rhythm on admission. INR subtherapeutic on admission at 1.8 -- continue tikosyn  and coumadin  per pharmD   Per Primary RA Obesity   Risk Assessment/Risk Scores:  New York  Heart Association (NYHA) Functional Class NYHA Class III  CHA2DS2-VASc Score = 5  This indicates a 7.2% annual risk of stroke. The patient's score is based upon: CHF History: 0 HTN History: 1 Diabetes History: 0 Stroke History: 0 Vascular Disease History: 1 Age Score: 2 Gender Score: 1   For questions or updates, please contact [facility] Please consult [email or web address] for contact info under  Signed, Manuelita Rummer, NP  09/13/2024 2:12  PM

## 2024-09-14 DIAGNOSIS — I5033 Acute on chronic diastolic (congestive) heart failure: Secondary | ICD-10-CM | POA: Diagnosis not present

## 2024-09-14 DIAGNOSIS — N1831 Chronic kidney disease, stage 3a: Secondary | ICD-10-CM | POA: Diagnosis not present

## 2024-09-14 DIAGNOSIS — J9601 Acute respiratory failure with hypoxia: Secondary | ICD-10-CM | POA: Diagnosis not present

## 2024-09-14 DIAGNOSIS — I480 Paroxysmal atrial fibrillation: Secondary | ICD-10-CM | POA: Diagnosis not present

## 2024-09-14 DIAGNOSIS — I10 Essential (primary) hypertension: Secondary | ICD-10-CM | POA: Diagnosis not present

## 2024-09-14 LAB — CBC
HCT: 30.7 % — ABNORMAL LOW (ref 36.0–46.0)
Hemoglobin: 10.1 g/dL — ABNORMAL LOW (ref 12.0–15.0)
MCH: 33 pg (ref 26.0–34.0)
MCHC: 32.9 g/dL (ref 30.0–36.0)
MCV: 100.3 fL — ABNORMAL HIGH (ref 80.0–100.0)
Platelets: 191 K/uL (ref 150–400)
RBC: 3.06 MIL/uL — ABNORMAL LOW (ref 3.87–5.11)
RDW: 16.2 % — ABNORMAL HIGH (ref 11.5–15.5)
WBC: 7.7 K/uL (ref 4.0–10.5)
nRBC: 0 % (ref 0.0–0.2)

## 2024-09-14 LAB — BASIC METABOLIC PANEL WITH GFR
Anion gap: 11 (ref 5–15)
BUN: 18 mg/dL (ref 8–23)
CO2: 23 mmol/L (ref 22–32)
Calcium: 8.3 mg/dL — ABNORMAL LOW (ref 8.9–10.3)
Chloride: 104 mmol/L (ref 98–111)
Creatinine, Ser: 1.49 mg/dL — ABNORMAL HIGH (ref 0.44–1.00)
GFR, Estimated: 36 mL/min — ABNORMAL LOW (ref >60)
Glucose, Bld: 96 mg/dL (ref 70–99)
Potassium: 4.1 mmol/L (ref 3.5–5.1)
Sodium: 138 mmol/L (ref 135–145)

## 2024-09-14 LAB — PROTIME-INR
INR: 2.1 — ABNORMAL HIGH (ref 0.8–1.2)
Prothrombin Time: 24.9 s — ABNORMAL HIGH (ref 11.4–15.2)

## 2024-09-14 LAB — POTASSIUM: Potassium: 4 mmol/L (ref 3.5–5.1)

## 2024-09-14 LAB — MAGNESIUM: Magnesium: 2.5 mg/dL — ABNORMAL HIGH (ref 1.7–2.4)

## 2024-09-14 MED ORDER — HYDRALAZINE HCL 25 MG PO TABS
25.0000 mg | ORAL_TABLET | Freq: Three times a day (TID) | ORAL | Status: DC
  Administered 2024-09-14 – 2024-09-15 (×4): 25 mg via ORAL
  Filled 2024-09-14 (×4): qty 1

## 2024-09-14 NOTE — Progress Notes (Signed)
 Pharmacy: Dofetilide  (Tikosyn ) - Follow Up Assessment and Electrolyte Replacement  Pharmacy consulted to assist in monitoring and replacing electrolytes in this 78 y.o. female admitted on 09/12/2024 undergoing dofetilide  re-initiation. First dofetilide  dose: resumed 11/22 ~2 am.  Labs:    Component Value Date/Time   K 4.1 09/14/2024 0734   MG 2.5 (H) 09/14/2024 0734     Plan: Potassium: K >/= 4: No additional supplementation needed  Magnesium : Mg > 2: No additional supplementation needed   Will watch lytes closely with IV diuresis.  Thank you for allowing pharmacy to participate in this patient's care   Harlene Barlow, Berdine [employer], [facility] Clinical Pharmacist  09/14/2024 11:06 AM   [facility] pharmacy phone numbers are listed on [email or web address]

## 2024-09-14 NOTE — Progress Notes (Signed)
 Rounding Note   Patient Name: [name] Date of Encounter: 09/14/2024  [name] Cardiologist: Redell Shallow, MD   Subjective No CP; dyspnea persist but improving; complains of diarrhea from UC/Crohn's  Scheduled Meds:  acidophilus  1 capsule Oral Daily   amLODipine   10 mg Oral Daily   carvedilol   12.5 mg Oral BID   dofetilide   375 mcg Oral BID   empagliflozin   10 mg Oral Daily   escitalopram   10 mg Oral Daily   fentaNYL  (SUBLIMAZE ) injection  50 mcg Intravenous Once   folic acid   1 mg Oral BID   furosemide   40 mg Intravenous BID   irbesartan   300 mg Oral Daily   pantoprazole   40 mg Oral Daily   sodium chloride  flush  3 mL Intravenous Q12H   spironolactone   12.5 mg Oral Daily   [START ON 09/15/2024] warfarin  1.25 mg Oral Once per day on Sunday Tuesday Thursday   And   warfarin  2.5 mg Oral Once per day on Monday Wednesday Friday Saturday   Warfarin - Pharmacist Dosing Inpatient   Does not apply q1600   Continuous Infusions:  PRN Meds: acetaminophen **OR** acetaminophen, hydrALAZINE, ipratropium-albuterol, loperamide HCl   Vital Signs  Vitals:   09/13/24 1757 09/13/24 2043 09/14/24 0000 09/14/24 0400  BP: (!) 161/44 (!) 153/56 (!) 164/62 (!) 175/57  Pulse: 79 67 61 (!) 57  Resp: 20 16 14 (!) 21  Temp:  98.6 F (37 C) 98.1 F (36.7 C) 98.1 F (36.7 C)  TempSrc:  Oral Oral Axillary  SpO2: 96% 90% 99% 97%  Weight:    110.2 kg  Height:        Intake/Output Summary (Last 24 hours) at 09/14/2024 0728 Last data filed at 09/14/2024 0300 Gross per 24 hour  Intake --  Output 500 ml  Net -500 ml      11 /22/2025    4:00 AM 09/12/2024    2:34 PM 09/10/2024    3:39 PM  Last 3 Weights  Weight (lbs) 242 lb 15.2 oz 245 lb 246 lb 3.2 oz  Weight (kg) 110.2 kg 111.131 kg 111.676 kg      Telemetry Sinus - Personally Reviewed    Physical Exam  GEN: No acute distress.   Neck: No JVD Cardiac: RRR, no murmurs, rubs, or gallops.  Respiratory:  Clear to auscultation bilaterally. GI: Soft, nontender, non-distended  MS: trace edema Neuro:  Nonfocal  Psych: Normal affect   Labs  Chemistry Recent Labs  Lab 09/12/24 1437 09/12/24 1745 09/13/24 1646 09/13/24 2353  NA 141 141 140  --   K 4.0 3.8 3.7 4.0  CL 108  --  105  --   CO2 22  --  23  --   GLUCOSE 101*  --  100*  --   BUN 17  --  19  --   CREATININE 1.20*  --  1.60*  --   CALCIUM  8.9  --  8.2*  --   MG  --   --  2.0  --   PROT 7.0  --   --   --   ALBUMIN 3.6  --   --   --   AST 31  --   --   --   ALT 17  --   --   --   ALKPHOS 76  --   --   --   BILITOT 0.4  --   --   --   [facility]  46*  --  33*  --   ANIONGAP 11  --  12  --      Hematology Recent Labs  Lab 09/12/24 1437 09/12/24 1745 09/13/24 0522  WBC 6.1  --  6.2  RBC 3.38*  --  3.02*  HGB 11.0* 10.9* 9.7*  HCT 34.0* 32.0* 30.5*  MCV 100.6*  --  101.0*  MCH 32.5  --  32.1  MCHC 32.4  --  31.8  RDW 15.9*  --  16.0*  PLT 187  --  180    BNP Recent Labs  Lab 09/12/24 1437  PROBNP 1,773.0*     Radiology  ECHOCARDIOGRAM COMPLETE Result Date: 09/13/2024    ECHOCARDIOGRAM REPORT   Patient Name:   [name] [facility] Date of Exam: 09/13/2024 Medical Rec #:  [id number]        Height:       63.0 in Accession #:    [id number]       Weight:       245.0 lb Date of Birth:  1946/05/28       BSA:          2.108 m Patient Age:    78 years         BP:           157/48 mmHg Patient Gender: F                HR:           65 bpm. Exam Location:  Inpatient Procedure: 2D Echo, Color Doppler and Cardiac Doppler (Both Spectral and Color            Flow Doppler were utilized during procedure). Indications:    CHF I50.31  History:        Patient has prior history of Echocardiogram examinations, most                 recent 09/12/2023. CHF, Arrythmias:Atrial Fibrillation; Risk                 Factors:Hypertension.  Sonographer:    Tinnie Gosling RDCS Referring Phys: [id number] RONDELL A SMITH IMPRESSIONS  1. Left ventricular  ejection fraction, by estimation, is 65 to 70%. The left ventricle has normal function. The left ventricle has no regional wall motion abnormalities. There is mild left ventricular hypertrophy. Left ventricular diastolic parameters were normal.  2. Right ventricular systolic function is normal. The right ventricular size is normal.  3. Left atrial size was moderately dilated.  4. The mitral valve is abnormal. Trivial mitral valve regurgitation. No evidence of mitral stenosis.  5. The aortic valve is tricuspid. There is moderate calcification of the aortic valve. Aortic valve regurgitation is not visualized. Aortic valve sclerosis is present, with no evidence of aortic valve stenosis.  6. The inferior vena cava is normal in size with greater than 50% respiratory variability, suggesting right atrial pressure of 3 mmHg. FINDINGS  Left Ventricle: Left ventricular ejection fraction, by estimation, is 65 to 70%. The left ventricle has normal function. The left ventricle has no regional wall motion abnormalities. Strain was performed and the global longitudinal strain is indeterminate. The left ventricular internal cavity size was normal in size. There is mild left ventricular hypertrophy. Left ventricular diastolic parameters were normal. Right Ventricle: The right ventricular size is normal. No increase in right ventricular wall thickness. Right ventricular systolic function is normal. Left Atrium: Left atrial size was moderately dilated. Right Atrium: Right atrial size was normal in  size. Pericardium: There is no evidence of pericardial effusion. Mitral Valve: The mitral valve is abnormal. There is mild thickening of the mitral valve leaflet(s). Trivial mitral valve regurgitation. No evidence of mitral valve stenosis. Tricuspid Valve: The tricuspid valve is normal in structure. Tricuspid valve regurgitation is not demonstrated. No evidence of tricuspid stenosis. Aortic Valve: The aortic valve is tricuspid. There is  moderate calcification of the aortic valve. Aortic valve regurgitation is not visualized. Aortic valve sclerosis is present, with no evidence of aortic valve stenosis. Pulmonic Valve: The pulmonic valve was normal in structure. Pulmonic valve regurgitation is not visualized. No evidence of pulmonic stenosis. Aorta: The aortic root is normal in size and structure. Venous: The inferior vena cava is normal in size with greater than 50% respiratory variability, suggesting right atrial pressure of 3 mmHg. IAS/Shunts: No atrial level shunt detected by color flow Doppler. Additional Comments: 3D was performed not requiring image post processing on an independent workstation and was indeterminate.  LEFT VENTRICLE PLAX 2D LVIDd:         5.10 cm   Diastology LVIDs:         2.90 cm   LV e' medial:    7.72 cm/s LV PW:         1.00 cm   LV E/e' medial:  16.7 LV IVS:        1.20 cm   LV e' lateral:   7.62 cm/s LVOT diam:     2.30 cm   LV E/e' lateral: 16.9 LV SV:         182 LV SV Index:   86 LVOT Area:     4.15 cm LV IVRT:       58 msec  RIGHT VENTRICLE             IVC RV S prime:     11.90 cm/s  IVC diam: 2.20 cm TAPSE (M-mode): 1.9 cm                             PULMONARY VEINS                             Diastolic Velocity: 37.20 cm/s                             S/D Velocity:       1.10                             Systolic Velocity:  41.70 cm/s LEFT ATRIUM              Index        RIGHT ATRIUM           Index LA diam:        5.20 cm  2.47 cm/m   RA Area:     18.20 cm LA Vol (A2C):   85.5 ml  40.56 ml/m  RA Volume:   54.10 ml  25.67 ml/m LA Vol (A4C):   121.0 ml 57.41 ml/m LA Biplane Vol: 107.0 ml 50.76 ml/m  AORTIC VALVE LVOT Vmax:   166.00 cm/s LVOT Vmean:  134.000 cm/s LVOT VTI:    0.438 m  AORTA Ao Root diam: 2.70 cm MITRAL VALVE MV Area (PHT): 2.28 cm     SHUNTS MV Decel  Time: 333 msec     Systemic VTI:  0.44 m MV E velocity: 129.00 cm/s  Systemic Diam: 2.30 cm MV A velocity: 85.50 cm/s MV E/A ratio:  1.51 Maude Emmer MD Electronically signed by Maude Emmer MD Signature Date/Time: 09/13/2024/3:34:33 PM    Final    CT Angio Chest/Abd/Pel for Dissection W and/or Wo Contrast Result Date: 09/12/2024 CLINICAL DATA:  Acute aortic syndrome suspected. Shortness of breath. EXAM: CT ANGIOGRAPHY CHEST, ABDOMEN AND PELVIS TECHNIQUE: Noncontrast chest CT was performed. Multidetector CT imaging through the chest, abdomen and pelvis was performed using the standard protocol during bolus administration of intravenous contrast. Multiplanar reconstructed images and MIPs were obtained and reviewed to evaluate the vascular anatomy. RADIATION DOSE REDUCTION: This exam was performed according to the departmental dose-optimization program which includes automated exposure control, adjustment of the mA and/or kV according to patient size and/or use of iterative reconstruction technique. CONTRAST:  OMNIPAQUE  IOHEXOL  350 MG/ML SOLN COMPARISON:  CT angiogram chest abdomen and pelvis 09/06/2023. FINDINGS: CTA CHEST FINDINGS Cardiovascular: Preferential opacification of the thoracic aorta. No evidence of thoracic aortic aneurysm or dissection. The heart is mildly enlarged. No pericardial effusion. The left vertebral artery arises from the aortic arch. Mediastinum/Nodes: There are nonenlarged and enlarged paratracheal lymph nodes which have increased in size and number measuring up to 14 mm. Nonenlarged subcarinal, right hilar, and prevascular lymph nodes have increased in number when compared to the prior study. Visualized thyroid  gland and esophagus are within normal limits. Lungs/Pleura: There are small to moderate-sized bilateral pleural effusions. There is compressive atelectasis in the bilateral lower lobes. The lungs are otherwise clear. There are few calcified granulomas in right lung. Musculoskeletal: No chest wall abnormality. No acute or significant osseous findings. Review of the MIP images confirms the above findings. CTA  ABDOMEN AND PELVIS FINDINGS VASCULAR Aorta: Normal caliber aorta without aneurysm, dissection, vasculitis or significant stenosis. There are atherosclerotic calcifications of the aorta. Celiac: Patent without evidence of aneurysm, dissection, vasculitis or significant stenosis. SMA: Patent without evidence of aneurysm, dissection, vasculitis or significant stenosis. Renals: Right nephrectomy changes present. Left renal artery within normal limits. IMA: Patent without evidence of aneurysm, dissection, vasculitis or significant stenosis. Inflow: Patent without evidence of aneurysm, dissection, vasculitis or significant stenosis. Calcified atherosclerotic disease present. Veins: No obvious venous abnormality within the limitations of this arterial phase study. Review of the MIP images confirms the above findings. NON-VASCULAR Hepatobiliary: No focal liver abnormality is seen. Status post cholecystectomy. No biliary dilatation. Pancreas: Unremarkable. No pancreatic ductal dilatation or surrounding inflammatory changes. Spleen: Normal in size without focal abnormality. 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 Bilateral adrenal glands and left kidney are within normal limits. The bladder is decompressed. There is a small amount of air in the bladder. Stomach/Bowel: Stomach is within normal limits. Appendix appears normal. No evidence of bowel wall thickening, distention, or inflammatory changes. There scattered colonic diverticula. Lymphatic: No enlarged lymph nodes are seen. Reproductive: Uterus and bilateral adnexa are unremarkable. Other: No abdominal wall hernia or abnormality. No abdominopelvic ascites. Musculoskeletal: No acute or significant osseous findings. Review of the MIP images confirms the above findings. IMPRESSION: 1. No evidence for aortic dissection or aneurysm. 2. Small to moderate-sized bilateral pleural effusions with compressive atelectasis in the lower lobes. 3. Mild  cardiomegaly. 4. Mediastinal lymphadenopathy, increased from prior. Findings may be reactive, but metastatic disease can not be excluded. 5. Small amount of air in the bladder. 6. Colonic diverticulosis. 7. Aortic atherosclerosis. Aortic Atherosclerosis (  ICD10-I70.0). Electronically Signed   By: Greig Pique M.D.   On: 09/12/2024 18:09   DG Chest 2 View Result Date: 09/12/2024 EXAM: 2 VIEW(S) XRAY OF THE CHEST 09/12/2024 03:30:31 PM COMPARISON: 09/10/2023 CLINICAL HISTORY: shortness of breath FINDINGS: LUNGS AND PLEURA: Minimal interstitial densities are noted bilaterally suggesting possible pulmonary edema. Small pleural effusions are noted. Minimal left basilar atelectasis. No pneumothorax. HEART AND MEDIASTINUM: Stable cardiomegaly. BONES AND SOFT TISSUES: No acute osseous abnormality. IMPRESSION: 1. Stable cardiomegaly. 2. Minimal interstitial opacities bilaterally, compatible with mild pulmonary edema. 3. Small bilateral pleural effusions. 4. Minimal left basilar atelectasis. Electronically signed by: Lynwood Seip MD 09/12/2024 03:42 PM EST RP Workstation: [id number]    Patient Profile   78 year old female with past medical history of paroxysmal atrial fibrillation, hypertension, nonobstructive coronary artery disease, previous right nephrectomy, heart failure preserved ejection fraction for evaluation of acute on chronic heart failure and hypertension.  Echocardiogram this admission shows normal LV function, mild left ventricular hypertrophy, moderate left atrial enlargement.  CTA shows no dissection or aneurysm, bilateral pleural effusions, mediastinal lymphadenopathy increased from previous.    Assessment & Plan  1 acute on chronic diastolic congestive heart failure-patient slightly improved but remains mildly volume overloaded.  Continue Lasix  40 mg IV twice daily and spironolactone  12.5 mg daily.  She is now incontinent.  Will hold Jardiance .     2 hypertension-blood pressure remains elevated.   CTA performed in the emergency room revealed no renal artery stenosis on the left and she has had previous right nephrectomy.  Continue present medications including carvedilol , amlodipine , Avapro .  Unclear if she has had an allergy to hydralazine  in the past.  Will try 25 mg p.o. 3 times daily.  Advance as tolerated.    3 paroxysmal atrial fibrillation-patient is in sinus rhythm.  Will continue carvedilol , dofetilide  and Coumadin .  Note creatinine has increased to 1.6.  May need to decrease dofetilide  pending follow-up readings.   4 chronic stage IIIa kidney disease-patient has had previous nephrectomy.  Creatinine increased to 1.6 today.  Likely related to diuresis and patient also had a CTA in the emergency room.  Follow closely.   5 coronary artery disease-patient denies chest pain.  She is intolerant to statins.   6 hyperlipidemia-intolerant to statins and previously declined all of her lipid-lowering medications.  For questions or updates, please contact [address] [name] Please consult [email or web address] for contact info under   Signed, Redell Shallow, MD  09/14/2024, 7:28 AM

## 2024-09-14 NOTE — Progress Notes (Signed)
 [facility]   ROUNDING   NOTE [name] FMW:[id number]  DOB: 07/11/1946  DOA: 09/12/2024  PCP: Domenica Harlene LABOR, MD  09/14/2024,10:24 AM  LOS: 1 day    Code Status: Full code     from: Home   78 year old  HFpEF baseline weight around 250 pounds last office visit/112 kilos-cath 2008 nonobstructive History of poorly controlled hypertension Permanent A-fib on Tikosyn /Coumadin  CHADS2 score >4 Rheumatoid arthritis followed by Dr. 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 Avram deferred to colonoscopy-has hemorrhoidal bleeding occasionally on Anusol  suppositories She is poorly compliant on dietary restrictions-recently started on more medications for blood pressure control  Chronology  11/21 admit from home hypertension with urgency 200s saturations 88% placed on BiPAP BNP 1700 troponin negative  Cardiology consulted   Pertinent imaging/studies till date  CXR cardiomegaly pleural effusion CT angio moderate pleural effusions no lymphadenopathy Echocardiogram 09/13/2024 EF 65-75% no valvular abnormalities   Assessment  & Plan :    Acute hypoxic respiratory status secondary to AECHF preserved EF Hypertensive urgency on admission Appreciative of cardiology input-Lasix  40  IV twice daily, I/O inaccurate weight down to 110 (baseline weight 112?) Continue amlodipine  10 Coreg  12.5 twice daily hydralazine  25 every 8 aVaPro  300 and Aldactone  12.5--- Jardiance  has been held  Not sure if we should challenge with ARB in conjunction with diuretics/Aldactone  as she has a solitary kidney Desatted slightly with ambulation not previously on oxygen May require oxygen depending on progress/volume Hypertensive urgency still not completely controlled ?hydralazine  to 100 every -- defer to cardiology over the next several days History of solitary kidney Careful  diuresis Permanent A-fib on Tikosyn  CHADVASC =5 Continue Tikosyn  375 twice daily, a.m. magnesium -Coreg  as above-adjustments as per cardiology Keep on monitors today Coumadin  dosing per pharmacist-therapeutic Rheumatoid arthritis follows with Dr. Mai Continues Remicade  and methotrexate as outpatient May need either dose adjustments or alternate agent to Remicade -CC Dr. Mai at discharge Ulcerative colitis internal hemorrhoids follows Dr. Avram Anusol  suppositories if needed Watch consistency of diarrhea-has had continued diarrhea over the past several months since last visit 04/12/2024 with Dr. Annie says that this is worsened by regular diet At some point in the past she was on sulfasalazine but she was intolerant of this and tells me that she thinks the hydralazine  and/or Lasix  is causing worsening of her diarrhea-I have told her I have never seen this happen but it is possible She may need to be challenged with a different medication such as Canasa  suppositories or mesalamine -if no better will discuss with GI although this may be able to wait until outpatient follow-up Morbid obesity stage grade 2 OSA supposed to be on BiPAP at bedtime BiPAP nightly   Data Reviewed today:  Sodium 138 potassium 4.1 BUN/creatinine 18/1.4 magnesium  2.5 WBC 7.7 hemoglobin 10.1 platelet 191 INR 2.1   DVT prophylaxis: Coumadin   Status is: Inpatient Inpatient pending resolution    Dispo/Global plan: Unclear at this time still diuresing  Discussed with daughter at the bedside  Time 60   Subjective:   States that she is breathing a little better than she was-physical therapy note reviewed and she desatted She is comfortable in bed has had some lunch She has had continued diarrhea over several weeks with solid foods-no chest pain no fever no nausea no vomiting currently She is willing to wear stockings    Objective + exam Vitals:   09/13/24 2043  09/14/24 0000 09/14/24 0400  09/14/24 0900  BP: (!) 153/56 (!) 164/62 (!) 175/57 (!) 156/61  Pulse: 67 61 (!) 57 65  Resp: 16 14 (!) 21 18  Temp: 98.6 F (37 C) 98.1 F (36.7 C) 98.1 F (36.7 C) 98.3 F (36.8 C)  TempSrc: Oral Oral Axillary Oral  SpO2: 90% 99% 97% 93%  Weight:   110.2 kg   Height:       Filed Weights   09/12/24 1434 09/14/24 0400  Weight: 111.1 kg 110.2 kg     Examination: Pleasant female no distress no JVD Chest is clear anteriorly has crackles posterolaterally both sides Abdomen is soft obese nontender distended no rebound Grade 2 lower extremity edema Power 5/5 Psych euthymic pleasant     Scheduled Meds:  acidophilus  1 capsule Oral Daily   amLODipine   10 mg Oral Daily   carvedilol   12.5 mg Oral BID   dofetilide   375 mcg Oral BID   escitalopram   10 mg Oral Daily   fentaNYL  (SUBLIMAZE ) injection  50 mcg Intravenous Once   folic acid   1 mg Oral BID   furosemide   40 mg Intravenous BID   hydrALAZINE   25 mg Oral Q8H   irbesartan   300 mg Oral Daily   pantoprazole   40 mg Oral Daily   sodium chloride  flush  3 mL Intravenous Q12H   spironolactone   12.5 mg Oral Daily   [START ON 09/15/2024] warfarin  1.25 mg Oral Once per day on Sunday Tuesday Thursday   And   warfarin  2.5 mg Oral Once per day on Monday Wednesday Friday Saturday   Warfarin - Pharmacist Dosing Inpatient   Does not apply q1600   Continuous Infusions: acetaminophen  **OR** acetaminophen , hydrALAZINE , ipratropium-albuterol , loperamide  HCl  Jai-Gurmukh Shena Vinluan, MD  [facility]

## 2024-09-14 NOTE — Progress Notes (Signed)
 PHARMACY - ANTICOAGULATION CONSULT NOTE  Pharmacy Consult for warfarin Indication: atrial fibrillation  Patient Measurements: Height: 5' 3 (160 cm) Weight: 110.2 kg (242 lb 15.2 oz) IBW/kg (Calculated) : 52.4 HEPARIN DW (KG): 79.2  Vital Signs: Temp: 98.3 F (36.8 C) (11/22 0900) Temp Source: Oral (11/22 0900) BP: 156/61 (11/22 0900) Pulse Rate: 65 (11/22 0900)  Labs: Recent Labs    09/12/24 1437 09/12/24 1745 09/13/24 0522 09/13/24 1646 09/14/24 0734  HGB 11.0* 10.9* 9.7*  --  10.1*  HCT 34.0* 32.0* 30.5*  --  30.7*  PLT 187  --  180  --  191  LABPROT 21.7*  --  22.3*  --  24.9*  INR 1.8*  --  1.9*  --  2.1*  CREATININE 1.20*  --   --  1.60* 1.49*    Estimated Creatinine Clearance: 36.5 mL/min (A) (by C-G formula based on SCr of 1.49 mg/dL (H)).   Medical History: Past Medical History:  Diagnosis Date   Allergy    Anxiety    Arthritis    Atrial fibrillation (HCC) 2008   Blood transfusion without reported diagnosis    CAD (coronary artery disease)    Crohn's colitis (HCC)    she reports ulcerative colitis beginning in her 72's, biopsies 2008 suggest Crohn's not UC   Depression    Diverticulosis    GERD (gastroesophageal reflux disease)    Hyperlipidemia    Hypertension    IBS (irritable bowel syndrome)    Keloid of skin    Obesity    OSA (obstructive sleep apnea)    Pancreatitis    Paroxysmal atrial fibrillation (HCC)    Pseudoaneurysm of right femoral artery    Renal oncocytoma    s/p right nephrectomy   Rheumatoid arthritis (HCC) 05/29/2017   Sleep apnea    UC (ulcerative colitis) (HCC)     Medications:  -Warfarin 1.25mg  PO every Sun, Tues, Thurs; 2.5mg  PO all other days   Assessment: 36 yoF presented with shortness of breath. Pharmacy consulted to dose warfarin for history of atrial fibrillation.  -Hgb 10.1, plts 191 -INR 2.1  Goal of Therapy:  INR 2-3 Monitor platelets by anticoagulation protocol: Yes   Plan:  -Continue home  warfarin regimen of 1.25mg  PO Sun,Tues,Thurs and 2.5mg  PO all other days -INR/CBC daily  Harlene Barlow, Berdine [employer]ON[employer]TA [employer], [facility] Clinical Pharmacist  09/14/2024 11:05 AM   [facility] pharmacy phone numbers are listed on [email or web address]

## 2024-09-14 NOTE — Evaluation (Signed)
 Physical Therapy Evaluation Patient Details Name: [name] MRN: [id number] DOB: 09/24/1946 Today's Date: 09/14/2024  History of Present Illness  The pt is a 78 yo female presenting 11/20 with SOB x1 month. Work up revealed acute resp failure with hypoxia due to acute CHF exacerbation. PMH includes: afib, HTN, CAD, R nephrectomy, HFpEF.  Clinical Impression  Pt in bed upon arrival of PT, agreeable to evaluation at this time. Prior to admission the pt reports she was mobilizing with use of a rollator in the home, progressively limited by endurance issues and SOB. The pt reports she is typically independent with ADLs and IADLs, no recent falls but typically sedentary in the home. The pt presents today with increased need for supplemental O2 (see below), limited endurance, power, and strength in LE. Will continue to benefit from skilled PT to progress functional independence with transfers and mobility. Has needed support from her spouse and all needed DME to return home once medically cleared.   SpO2 on 5L at rest (upon my arrival): 99% SpO2 on RA at rest: 84% SpO2 on 2L at rest: 98% SpO2 on 2L after 12ft ambulation: 88% SpO2 on 3L after 34ft ambulation: 94%  BP before mobility: 162/64 (94) BP after mobility: 148/86 (93)    If plan is discharge home, recommend the following: A little help with walking and/or transfers;A lot of help with bathing/dressing/bathroom;Help with stairs or ramp for entrance   Can travel by private vehicle        Equipment Recommendations Other (comment) (oxygen)  Recommendations for Other Services       Functional Status Assessment Patient has had a recent decline in their functional status and demonstrates the ability to make significant improvements in function in a reasonable and predictable amount of time.     Precautions / Restrictions Precautions Precautions: Fall Recall of Precautions/Restrictions: Intact Precaution/Restrictions Comments:  2-3L O2 Restrictions Weight Bearing Restrictions Per Provider Order: No      Mobility  Bed Mobility Overal bed mobility: Independent             General bed mobility comments: sitting EOB upon arrival    Transfers Overall transfer level: Modified independent Equipment used: Rollator (4 wheels)               General transfer comment: modI with rollator, no assist other than line management. good use of safety features    Ambulation/Gait Ambulation/Gait assistance: Supervision Gait Distance (Feet): 150 Feet Assistive device: Rollator (4 wheels) Gait Pattern/deviations: Step-through pattern, Decreased stride length Gait velocity: decreased Gait velocity interpretation: <1.31 ft/sec, indicative of household ambulator   General Gait Details: pt with slow, guarded gait but stable. 75 ft on 2L with SpO2 to 88%, 3L to recover to 90s then walked 73ft back to room with SpO2 >94%    Balance Overall balance assessment: Needs assistance Sitting-balance support: No upper extremity supported, Feet supported Sitting balance-Leahy Scale: Good     Standing balance support: Bilateral upper extremity supported, During functional activity Standing balance-Leahy Scale: Fair Standing balance comment: can static stand without UE support, prefers BUE support for gait                             Pertinent Vitals/Pain Pain Assessment Pain Assessment: Faces Pain Score: 2  Faces Pain Scale: Hurts a little bit Pain Location: general ache from arthritis Pain Descriptors / Indicators: Aching Pain Intervention(s): Limited activity within patient's tolerance, Monitored during session,  Repositioned    Home Living Family/patient expects to be discharged to:: Private residence Living Arrangements: Spouse/significant other Available Help at Discharge: Family;Available 24 hours/day Type of Home: House Home Access: Stairs to enter;Ramped entrance (ramp to 1 stair) Entrance  Stairs-Rails: Right;Left Entrance Stairs-Number of Steps: 1   Home Layout: One level Home Equipment: Tub bench;Grab bars - toilet;Grab bars - tub/shower;Hand held shower head;Rollator (4 wheels);Lift chair      Prior Function Prior Level of Function : Independent/Modified Independent             Mobility Comments: largely sedentary, uses rollator most of the time but can walk without in the house, had PT about a year ago, no falls ADLs Comments: typically independent with cooking, cleaning, but more limited recently     Extremity/Trunk Assessment   Upper Extremity Assessment Upper Extremity Assessment: Right hand dominant;Generalized weakness    Lower Extremity Assessment Lower Extremity Assessment: Generalized weakness (grossly 4-/5 to MMT. no changes in sensation)    Cervical / Trunk Assessment Cervical / Trunk Assessment: Other exceptions Cervical / Trunk Exceptions: increased body habitus  Communication   Communication Communication: No apparent difficulties    Cognition Arousal: Alert Behavior During Therapy: WFL for tasks assessed/performed   PT - Cognitive impairments: No apparent impairments                         Following commands: Intact       Cueing Cueing Techniques: Verbal cues     General Comments General comments (skin integrity, edema, etc.): pt on 5L upon arrival with SpO2 99%, decreased to RA with SpO2 to 84%. on 2L at rest SpO2 98%, pt then ambulated 75 ft on 2L with SpO2 to 88%, 3L to recover to 90s then walked 24ft back to room with SpO2 >94%        Assessment/Plan    PT Assessment Patient needs continued PT services  PT Problem List Decreased strength;Decreased activity tolerance;Decreased balance;Decreased mobility;Cardiopulmonary status limiting activity       PT Treatment Interventions DME instruction;Gait training;Stair training;Functional mobility training;Therapeutic activities;Therapeutic exercise;Balance  training;Patient/family education    PT Goals (Current goals can be found in the Care Plan section)  Acute Rehab PT Goals Patient Stated Goal: to return home PT Goal Formulation: With patient Time For Goal Achievement: 09/28/24 Potential to Achieve Goals: Good    Frequency Min 2X/week        AM-PAC PT 6 Clicks Mobility  Outcome Measure Help needed turning from your back to your side while in a flat bed without using bedrails?: None Help needed moving from lying on your back to sitting on the side of a flat bed without using bedrails?: None Help needed moving to and from a bed to a chair (including a wheelchair)?: A Little Help needed standing up from a chair using your arms (e.g., wheelchair or bedside chair)?: A Little Help needed to walk in hospital room?: A Little Help needed climbing 3-5 steps with a railing? : A Little 6 Click Score: 20    End of Session Equipment Utilized During Treatment: Gait belt;Oxygen Activity Tolerance: Patient tolerated treatment well Patient left: in bed;with call bell/phone within reach;with family/visitor present;with nursing/sitter in room (sitting EOB) Nurse Communication: Mobility status;Other (comment) (O2 needs) PT Visit Diagnosis: Unsteadiness on feet (R26.81);Other abnormalities of gait and mobility (R26.89);Muscle weakness (generalized) (M62.81)    Time: 9185-9154 PT Time Calculation (min) (ACUTE ONLY): 31 min   Charges:   PT  Evaluation $PT Eval Low Complexity: 1 Low PT Treatments $Therapeutic Exercise: 8-22 mins PT General Charges $$ ACUTE PT VISIT: 1 Visit         Izetta Call, PT, DPT   Acute Rehabilitation Department Office [phone] Secure Chat Communication Preferred  Izetta JULIANNA Call 09/14/2024, 8:54 AM

## 2024-09-14 NOTE — Progress Notes (Signed)
 SATURATION QUALIFICATIONS: (This note is used to comply with regulatory documentation for home oxygen)  Patient Saturations on Room Air at Rest = 84%  Patient Saturations on 2L O2 at Rest = 98%  Patient Saturations on Room Air while Ambulating = n/a%  Patient Saturations on 2 Liters of oxygen while Ambulating = 88%  Patient Saturations on 3 Liters of oxygen while Ambulating = 94%  Please briefly explain why patient needs home oxygen: Pt unable to maintain SpO2 >90% on room air at this time, needing 2L to maintain >90% at rest and 3L to maintain >90% while ambulating.   Izetta Call, PT, DPT   Acute Rehabilitation Department Office [phone] Secure Chat Communication Preferred

## 2024-09-15 DIAGNOSIS — N1831 Chronic kidney disease, stage 3a: Secondary | ICD-10-CM | POA: Diagnosis not present

## 2024-09-15 DIAGNOSIS — I10 Essential (primary) hypertension: Secondary | ICD-10-CM | POA: Diagnosis not present

## 2024-09-15 DIAGNOSIS — J9601 Acute respiratory failure with hypoxia: Secondary | ICD-10-CM | POA: Diagnosis not present

## 2024-09-15 DIAGNOSIS — I480 Paroxysmal atrial fibrillation: Secondary | ICD-10-CM | POA: Diagnosis not present

## 2024-09-15 DIAGNOSIS — I5033 Acute on chronic diastolic (congestive) heart failure: Secondary | ICD-10-CM | POA: Diagnosis not present

## 2024-09-15 LAB — CBC
HCT: 30.7 % — ABNORMAL LOW (ref 36.0–46.0)
Hemoglobin: 10 g/dL — ABNORMAL LOW (ref 12.0–15.0)
MCH: 32.8 pg (ref 26.0–34.0)
MCHC: 32.6 g/dL (ref 30.0–36.0)
MCV: 100.7 fL — ABNORMAL HIGH (ref 80.0–100.0)
Platelets: 195 K/uL (ref 150–400)
RBC: 3.05 MIL/uL — ABNORMAL LOW (ref 3.87–5.11)
RDW: 15.9 % — ABNORMAL HIGH (ref 11.5–15.5)
WBC: 8.7 K/uL (ref 4.0–10.5)
nRBC: 0 % (ref 0.0–0.2)

## 2024-09-15 LAB — BASIC METABOLIC PANEL WITH GFR
Anion gap: 9 (ref 5–15)
Anion gap: 7 (ref 5–15)
BUN: 22 mg/dL (ref 8–23)
BUN: 27 mg/dL — ABNORMAL HIGH (ref 8–23)
CO2: 26 mmol/L (ref 22–32)
CO2: 25 mmol/L (ref 22–32)
Calcium: 8 mg/dL — ABNORMAL LOW (ref 8.9–10.3)
Calcium: 7.8 mg/dL — ABNORMAL LOW (ref 8.9–10.3)
Chloride: 102 mmol/L (ref 98–111)
Chloride: 99 mmol/L (ref 98–111)
Creatinine, Ser: 1.65 mg/dL — ABNORMAL HIGH (ref 0.44–1.00)
Creatinine, Ser: 1.67 mg/dL — ABNORMAL HIGH (ref 0.44–1.00)
GFR, Estimated: 31 mL/min — ABNORMAL LOW (ref >60)
GFR, Estimated: 31 mL/min — ABNORMAL LOW (ref >60)
Glucose, Bld: 86 mg/dL (ref 70–99)
Glucose, Bld: 102 mg/dL — ABNORMAL HIGH (ref 70–99)
Potassium: 3.7 mmol/L (ref 3.5–5.1)
Potassium: 4 mmol/L (ref 3.5–5.1)
Sodium: 137 mmol/L (ref 135–145)
Sodium: 131 mmol/L — ABNORMAL LOW (ref 135–145)

## 2024-09-15 LAB — MAGNESIUM
Magnesium: 2.1 mg/dL (ref 1.7–2.4)
Magnesium: 2 mg/dL (ref 1.7–2.4)

## 2024-09-15 LAB — PROTIME-INR
INR: 2.3 — ABNORMAL HIGH (ref 0.8–1.2)
Prothrombin Time: 26.8 s — ABNORMAL HIGH (ref 11.4–15.2)

## 2024-09-15 MED ORDER — HYDRALAZINE HCL 20 MG/ML IJ SOLN
10.0000 mg | Freq: Once | INTRAMUSCULAR | Status: AC
  Administered 2024-09-15: 10 mg via INTRAVENOUS
  Filled 2024-09-15: qty 1

## 2024-09-15 MED ORDER — CARVEDILOL 25 MG PO TABS
25.0000 mg | ORAL_TABLET | Freq: Two times a day (BID) | ORAL | Status: DC
  Administered 2024-09-15 – 2024-09-18 (×7): 25 mg via ORAL
  Filled 2024-09-15 (×7): qty 1

## 2024-09-15 MED ORDER — PANTOPRAZOLE SODIUM 40 MG PO TBEC
40.0000 mg | DELAYED_RELEASE_TABLET | Freq: Two times a day (BID) | ORAL | Status: DC
  Administered 2024-09-15 – 2024-09-18 (×6): 40 mg via ORAL
  Filled 2024-09-15 (×6): qty 1

## 2024-09-15 MED ORDER — FUROSEMIDE 10 MG/ML IJ SOLN
40.0000 mg | Freq: Every day | INTRAMUSCULAR | Status: DC
  Administered 2024-09-16 – 2024-09-18 (×3): 40 mg via INTRAVENOUS
  Filled 2024-09-15 (×3): qty 4

## 2024-09-15 MED ORDER — HYDRALAZINE HCL 50 MG PO TABS
50.0000 mg | ORAL_TABLET | Freq: Three times a day (TID) | ORAL | Status: DC
  Administered 2024-09-15 – 2024-09-17 (×6): 50 mg via ORAL
  Filled 2024-09-15 (×6): qty 1

## 2024-09-15 MED ORDER — POTASSIUM CHLORIDE 20 MEQ PO PACK
40.0000 meq | PACK | Freq: Once | ORAL | Status: AC
  Administered 2024-09-15: 40 meq via ORAL
  Filled 2024-09-15: qty 2

## 2024-09-15 NOTE — Progress Notes (Signed)
 Mobility Specialist Progress Note:    09/15/24 1017  Mobility  Activity Turned to back - supine (Ankle Pumps, Leg Lifts, Heel Slides)  Level of Assistance Standby assist, set-up cues, supervision of patient - no hands on  Assistive Device None  Range of Motion/Exercises Active Assistive  Activity Response Tolerated fair  Mobility Referral Yes  Mobility visit 1 Mobility  Mobility Specialist Start Time (ACUTE ONLY) 1017  Mobility Specialist Stop Time (ACUTE ONLY) 1026  Mobility Specialist Time Calculation (min) (ACUTE ONLY) 9 min   Received pt laying in bed, not feeling well enough to ambulate (confirmed by RN) but wanting to do some bed mobility. C/o abdominal pain and fatigue. Pt able to perform movements w/ assist. Left pt in bed to rest and all needs met. Family in room.   Venetia Keel Mobility Specialist Please Neurosurgeon or Rehab Office at [phone]

## 2024-09-15 NOTE — Plan of Care (Signed)
  Problem: Education: Goal: Knowledge of General Education information will improve Description: Including pain rating scale, medication(s)/side effects and non-pharmacologic comfort measures Outcome: Progressing   Problem: Health Behavior/Discharge Planning: Goal: Ability to manage health-related needs will improve Outcome: Progressing   Problem: Clinical Measurements: Goal: Ability to maintain clinical measurements within normal limits will improve Outcome: Progressing Goal: Will remain free from infection Outcome: Progressing Goal: Diagnostic test results will improve Outcome: Progressing Goal: Respiratory complications will improve Outcome: Progressing Goal: Cardiovascular complication will be avoided Outcome: Progressing   Problem: Activity: Goal: Risk for activity intolerance will decrease Outcome: Progressing   Problem: Nutrition: Goal: Adequate nutrition will be maintained Outcome: Progressing   Problem: Coping: Goal: Level of anxiety will decrease Outcome: Progressing   Problem: Elimination: Goal: Will not experience complications related to bowel motility Outcome: Progressing Goal: Will not experience complications related to urinary retention Outcome: Progressing   Problem: Pain Managment: Goal: General experience of comfort will improve and/or be controlled Outcome: Progressing   Problem: Safety: Goal: Ability to remain free from injury will improve Outcome: Progressing   Problem: Skin Integrity: Goal: Risk for impaired skin integrity will decrease Outcome: Progressing   Problem: Education: Goal: Ability to demonstrate management of disease process will improve Outcome: Progressing Goal: Ability to verbalize understanding of medication therapies will improve Outcome: Progressing Goal: Individualized Educational Video(s) Outcome: Progressing   Problem: Activity: Goal: Capacity to carry out activities will improve Outcome: Progressing    Problem: Cardiac: Goal: Ability to achieve and maintain adequate cardiopulmonary perfusion will improve Outcome: Progressing   Problem: Education: Goal: Knowledge of disease or condition will improve Outcome: Progressing Goal: Understanding of medication regimen will improve Outcome: Progressing Goal: Individualized Educational Video(s) Outcome: Progressing   Problem: Activity: Goal: Ability to tolerate increased activity will improve Outcome: Progressing   Problem: Cardiac: Goal: Ability to achieve and maintain adequate cardiopulmonary perfusion will improve Outcome: Progressing   Problem: Health Behavior/Discharge Planning: Goal: Ability to safely manage health-related needs after discharge will improve Outcome: Progressing

## 2024-09-15 NOTE — Evaluation (Signed)
 Occupational Therapy Evaluation Patient Details Name: [name] MRN: [id number] DOB: 05/23/1946 Today's Date: 09/15/2024   History of Present Illness   The pt is a 78 yo female presenting 11/20 with SOB x1 month. Work up revealed acute resp failure with hypoxia due to acute CHF exacerbation. PMH includes: afib, HTN, CAD, R nephrectomy, HFpEF.     Clinical Impressions Pt received in bed, multiple family members present. Pt asleep but awakens easily, AOX4. Pleasant & participatory. PTA, she was living with her husband and reports indep with ADLs, IADLs, and mobility via rollator. She presents today with reduced activity tolerance and generalized weakness. Endorsing soreness in B shoulders (R>L) from BP cuff pumping up high, limiting functional use of BUE - would benefit from UE HEP for improving ROM/strength. She currently requires max A for LB ADLs and CGA for standing UB ADLs and mobility with RW in room. SpO2 maintained > 93% on 2L [place] throughout, did briefly desat to ~84% on RA but recovered quickly with rest and cues for PLB. Left upright in chair with all needs met.  Pt is currently functioning below baseline and would benefit from ongoing acute OT services to progress towards safe discharge and to facilitate return to prior level of function. Current recommendation is home with home health OT. Pt & family amenable to this plan.     If plan is discharge home, recommend the following:   A little help with walking and/or transfers;A little help with bathing/dressing/bathroom;Assistance with cooking/housework;Assist for transportation;Help with stairs or ramp for entrance     Functional Status Assessment   Patient has had a recent decline in their functional status and demonstrates the ability to make significant improvements in function in a reasonable and predictable amount of time.     Equipment Recommendations   None recommended by OT     Recommendations for Other  Services         Precautions/Restrictions   Precautions Precautions: Fall Recall of Precautions/Restrictions: Intact Precaution/Restrictions Comments: monitor O2 Restrictions Weight Bearing Restrictions Per Provider Order: No     Mobility Bed Mobility Overal bed mobility: Needs Assistance Bed Mobility: Supine to Sit     Supine to sit: Supervision     General bed mobility comments: Exited to the R side, HOB slightly elevated, incr time/effort    Transfers Overall transfer level: Needs assistance Equipment used: Rolling walker (2 wheels) Transfers: Sit to/from Stand, Bed to chair/wheelchair/BSC Sit to Stand: Supervision     Step pivot transfers: Supervision, Contact guard assist     General transfer comment: STS from bed with VC for hand placement, ambulated household distance with RW and up to CGA for safety/obstacle navigation.      Balance Overall balance assessment: Needs assistance Sitting-balance support: No upper extremity supported, Feet supported Sitting balance-Leahy Scale: Good Sitting balance - Comments: seated EOB   Standing balance support: Bilateral upper extremity supported, During functional activity, Reliant on assistive device for balance Standing balance-Leahy Scale: Fair Standing balance comment: reliant on RW, no LOB observed                           ADL either performed or assessed with clinical judgement   ADL Overall ADL's : Needs assistance/impaired Eating/Feeding: Independent   Grooming: Supervision/safety;Contact guard assist;Standing;Wash/dry hands;Oral care   Upper Body Bathing: Set up   Lower Body Bathing: Maximal assistance   Upper Body Dressing : Set up   Lower Body Dressing: Maximal  assistance Lower Body Dressing Details (indicate cue type and reason): adjusting B socks, limited by reduced functional reach and body habitus     Toileting- Clothing Manipulation and Hygiene: Maximal assistance Toileting -  Clothing Manipulation Details (indicate cue type and reason): purewick inact due to frequency; anticipate incr A needed for hygiene/reaching d/t functional deficits     Functional mobility during ADLs: Contact guard assist;Rolling walker (2 wheels)       Vision Baseline Vision/History: 1 Wears glasses Ability to See in Adequate Light: 0 Adequate Patient Visual Report: No change from baseline Vision Assessment?: Wears glasses for reading     Perception         Praxis         Pertinent Vitals/Pain Pain Assessment Pain Assessment: 0-10 Pain Score: 7  Pain Location: HA Pain Descriptors / Indicators: Headache Pain Intervention(s): Patient requesting pain meds-RN notified, Limited activity within patient's tolerance, Monitored during session     Extremity/Trunk Assessment Upper Extremity Assessment Upper Extremity Assessment: Right hand dominant;RUE deficits/detail;LUE deficits/detail RUE Deficits / Details: pt endorsing shoulder soreness from BP cuff - limited AROM to ~20* shldr flexion; elbow, wrist, hand ROM WFL RUE: Shoulder pain with ROM (soreness) RUE Sensation: WNL LUE Deficits / Details: endorsing shoulder soreness from BP cuff (not as sore as RUE), limited shoulder mobility because of this LUE: Shoulder pain with ROM (soreness) LUE Sensation: WNL   Lower Extremity Assessment Lower Extremity Assessment: Generalized weakness   Cervical / Trunk Assessment Cervical / Trunk Assessment: Other exceptions Cervical / Trunk Exceptions: incr body habitus   Communication Communication Communication: Impaired Factors Affecting Communication: Hearing impaired (wears b/l hearing aides, intact during session)   Cognition Arousal: Alert Behavior During Therapy: WFL for tasks assessed/performed Cognition: No apparent impairments                               Following commands: Intact       Cueing  General Comments   Cueing Techniques: Verbal cues  SpO2  desatted to ~84% on RA, incr to 93% on 2L [place] with seated rest and cues for PLB; SpO2 maintained ~93-94% during majority of session/mobility on 2L [place]   Exercises     Shoulder Instructions      Home Living Family/patient expects to be discharged to:: Private residence Living Arrangements: Spouse/significant other Available Help at Discharge: Family;Available 24 hours/day Type of Home: House Home Access: Stairs to enter;Ramped entrance (ramp to one stair) Entrance Stairs-Number of Steps: 1 Entrance Stairs-Rails: Right;Left Home Layout: One level     Bathroom Shower/Tub: Chief Strategy Officer: Handicapped height     Home Equipment: Tub bench;Grab bars - toilet;Grab bars - tub/shower;Hand held shower head;Rollator (4 wheels);Lift chair          Prior Functioning/Environment Prior Level of Function : Independent/Modified Independent             Mobility Comments: largely sedentary, uses rollator most of the time but can walk without in the house, had PT about a year ago, denies falls ADLs Comments: indep with ADLs, IADLs (has been limited in IADL performance lately 2/2 low energy levels/SOB)    OT Problem List: Decreased strength;Decreased range of motion;Decreased activity tolerance;Impaired balance (sitting and/or standing);Obesity;Impaired UE functional use   OT Treatment/Interventions: Self-care/ADL training;Therapeutic exercise;Therapeutic activities;Patient/family education;Balance training;Energy conservation;DME and/or AE instruction      OT Goals(Current goals can be found in the care plan section)  Acute Rehab OT Goals Patient Stated Goal: get better, pt amenable to [facility] OT Goal Formulation: With patient/family Time For Goal Achievement: 09/29/24 Potential to Achieve Goals: Good   OT Frequency:  Min 2X/week    Co-evaluation              AM-PAC OT 6 Clicks Daily Activity     Outcome Measure Help from another person eating meals?:  None Help from another person taking care of personal grooming?: A Little Help from another person toileting, which includes using toliet, bedpan, or urinal?: A Lot Help from another person bathing (including washing, rinsing, drying)?: A Lot Help from another person to put on and taking off regular upper body clothing?: A Lot Help from another person to put on and taking off regular lower body clothing?: A Lot 6 Click Score: 15   End of Session Equipment Utilized During Treatment: Rolling walker (2 wheels);Oxygen Nurse Communication: Mobility status  Activity Tolerance: Patient tolerated treatment well Patient left: in chair;with call bell/phone within reach;with family/visitor present  OT Visit Diagnosis: Unsteadiness on feet (R26.81);Muscle weakness (generalized) (M62.81)                Time: 8494-8460 OT Time Calculation (min): 34 min Charges:  OT General Charges $OT Visit: 1 Visit OT Evaluation $OT Eval Moderate Complexity: 1 Mod OT Treatments $Self Care/Home Management : 8-22 mins  Heber Hoog M. [place], OTR/L [facility] Acute Rehabilitation Services [phone] Secure Chat Preferred  Tarrin Menn 09/15/2024, 4:59 PM

## 2024-09-15 NOTE — Progress Notes (Signed)
 PHARMACY - ANTICOAGULATION CONSULT NOTE  Pharmacy Consult for warfarin Indication: atrial fibrillation  Patient Measurements: Height: 5' 3 (160 cm) Weight: 105.6 kg (232 lb 14.4 oz) IBW/kg (Calculated) : 52.4 HEPARIN DW (KG): 79.2  Vital Signs: Temp: 98.2 F (36.8 C) (11/23 0748) Temp Source: Oral (11/23 0748) BP: 185/68 (11/23 0748) Pulse Rate: 61 (11/23 0748)  Labs: Recent Labs    09/13/24 0522 09/13/24 1646 09/14/24 0734 09/15/24 0124  HGB 9.7*  --  10.1* 10.0*  HCT 30.5*  --  30.7* 30.7*  PLT 180  --  191 195  LABPROT 22.3*  --  24.9* 26.8*  INR 1.9*  --  2.1* 2.3*  CREATININE  --  1.60* 1.49* 1.65*    Estimated Creatinine Clearance: 32.2 mL/min (A) (by C-G formula based on SCr of 1.65 mg/dL (H)).   Medical History: Past Medical History:  Diagnosis Date   Allergy    Anxiety    Arthritis    Atrial fibrillation (HCC) 2008   Blood transfusion without reported diagnosis    CAD (coronary artery disease)    Crohn's colitis (HCC)    she reports ulcerative colitis beginning in her 59's, biopsies 2008 suggest Crohn's not UC   Depression    Diverticulosis    GERD (gastroesophageal reflux disease)    Hyperlipidemia    Hypertension    IBS (irritable bowel syndrome)    Keloid of skin    Obesity    OSA (obstructive sleep apnea)    Pancreatitis    Paroxysmal atrial fibrillation (HCC)    Pseudoaneurysm of right femoral artery    Renal oncocytoma    s/p right nephrectomy   Rheumatoid arthritis (HCC) 05/29/2017   Sleep apnea    UC (ulcerative colitis) (HCC)     Medications:  -Warfarin 1.25mg  PO every Sun, Tues, Thurs; 2.5mg  PO all other days   Assessment: 21 yoF presented with shortness of breath. Pharmacy consulted to dose warfarin for history of atrial fibrillation.  -Hgb 10, plts 195 -INR 2.3  Goal of Therapy:  INR 2-3 Monitor platelets by anticoagulation protocol: Yes   Plan:  -Continue home warfarin regimen of 1.25mg  PO Sun,Tues,Thurs and 2.5mg   PO all other days -INR/CBC daily  Harlene Barlow, [employer], [facility] Clinical Pharmacist  09/15/2024 8:16 AM   [facility] pharmacy phone numbers are listed on [email or web address]

## 2024-09-15 NOTE — Progress Notes (Addendum)
 Rounding Note   Patient Name: [name] Date of Encounter: 09/15/2024  [facility] Cardiologist: Redell Shallow, MD   Subjective Denies CP; dyspnea continues to improve; mild nausea  Scheduled Meds:  acidophilus  1 capsule Oral Daily   amLODipine   10 mg Oral Daily   carvedilol   12.5 mg Oral BID   dofetilide   375 mcg Oral BID   escitalopram   10 mg Oral Daily   fentaNYL  (SUBLIMAZE ) injection  50 mcg Intravenous Once   folic acid   1 mg Oral BID   furosemide   40 mg Intravenous BID   hydrALAZINE   25 mg Oral Q8H   irbesartan   300 mg Oral Daily   pantoprazole   40 mg Oral Daily   sodium chloride  flush  3 mL Intravenous Q12H   spironolactone   12.5 mg Oral Daily   warfarin  1.25 mg Oral Once per day on Sunday Tuesday Thursday   And   warfarin  2.5 mg Oral Once per day on Monday Wednesday Friday Saturday   Warfarin - Pharmacist Dosing Inpatient   Does not apply q1600   Continuous Infusions:  PRN Meds: acetaminophen **OR** acetaminophen, hydrALAZINE, ipratropium-albuterol, loperamide HCl   Vital Signs  Vitals:   09/15/24 0025 09/15/24 0124 09/15/24 0200 09/15/24 0500  BP: (!) 205/64 (!) 189/57 (!) 161/52 (!) 176/62  Pulse: 63 62 71 71  Resp: 20 (!) 24  (!) 23  Temp: 98.5 F (36.9 C)   97.7 F (36.5 C)  TempSrc: Oral   Oral  SpO2: 97% 98% 98% 97%  Weight:    105.6 kg  Height:        Intake/Output Summary (Last 24 hours) at 09/15/2024 0729 Last data filed at 09/14/2024 2200 Gross per 24 hour  Intake 3 ml  Output 2000 ml  Net -1997 ml      11 /23/2025    5:00 AM 09/14/2024    4:00 AM 09/12/2024    2:34 PM  Last 3 Weights  Weight (lbs) 232 lb 14.4 oz 242 lb 15.2 oz 245 lb  Weight (kg) 105.643 kg 110.2 kg 111.131 kg      Telemetry Sinus with PACs- Personally Reviewed    Physical Exam  GEN: NAD  Neck: supple Cardiac: RRR Respiratory: CTA GI: Soft, NT/ND MS: trace edema Neuro:  No focal findings Psych: Normal affect    Labs  Chemistry Recent Labs  Lab 09/12/24 1437 09/12/24 1745 09/13/24 1646 09/13/24 2353 09/14/24 0734 09/15/24 0124  NA 141   < > 140  --  138 137  K 4.0   < > 3.7 4.0 4.1 3.7  CL 108  --  105  --  104 102  CO2 22  --  23  --  23 26  GLUCOSE 101*  --  100*  --  96 86  BUN 17  --  19  --  18 22  CREATININE 1.20*  --  1.60*  --  1.49* 1.65*  CALCIUM  8.9  --  8.2*  --  8.3* 8.0*  MG  --   --  2.0  --  2.5* 2.1  PROT 7.0  --   --   --   --   --   ALBUMIN 3.6  --   --   --   --   --   AST 31  --   --   --   --   --   ALT 17  --   --   --   --   --  ALKPHOS 76  --   --   --   --   --   BILITOT 0.4  --   --   --   --   --   GFRNONAA 46*  --  33*  --  36* 31*  ANIONGAP 11  --  12  --  11 9   < > = values in this interval not displayed.     Hematology Recent Labs  Lab 09/13/24 0522 09/14/24 0734 09/15/24 0124  WBC 6.2 7.7 8.7  RBC 3.02* 3.06* 3.05*  HGB 9.7* 10.1* 10.0*  HCT 30.5* 30.7* 30.7*  MCV 101.0* 100.3* 100.7*  MCH 32.1 33.0 32.8  MCHC 31.8 32.9 32.6  RDW 16.0* 16.2* 15.9*  PLT 180 191 195    BNP Recent Labs  Lab 09/12/24 1437  PROBNP 1,773.0*     Radiology  ECHOCARDIOGRAM COMPLETE Result Date: 09/13/2024    ECHOCARDIOGRAM REPORT   Patient Name:   [name] [facility] Date of Exam: 09/13/2024 Medical Rec #:  [id number]        Height:       63.0 in Accession #:    [id number]       Weight:       245.0 lb Date of Birth:  October 11, 1946       BSA:          2.108 m Patient Age:    78 years         BP:           157/48 mmHg Patient Gender: F                HR:           65 bpm. Exam Location:  Inpatient Procedure: 2D Echo, Color Doppler and Cardiac Doppler (Both Spectral and Color            Flow Doppler were utilized during procedure). Indications:    CHF I50.31  History:        Patient has prior history of Echocardiogram examinations, most                 recent 09/12/2023. CHF, Arrythmias:Atrial Fibrillation; Risk                 Factors:Hypertension.  Sonographer:     Tinnie Gosling RDCS Referring Phys: [phone] RONDELL A SMITH IMPRESSIONS  1. Left ventricular ejection fraction, by estimation, is 65 to 70%. The left ventricle has normal function. The left ventricle has no regional wall motion abnormalities. There is mild left ventricular hypertrophy. Left ventricular diastolic parameters were normal.  2. Right ventricular systolic function is normal. The right ventricular size is normal.  3. Left atrial size was moderately dilated.  4. The mitral valve is abnormal. Trivial mitral valve regurgitation. No evidence of mitral stenosis.  5. The aortic valve is tricuspid. There is moderate calcification of the aortic valve. Aortic valve regurgitation is not visualized. Aortic valve sclerosis is present, with no evidence of aortic valve stenosis.  6. The inferior vena cava is normal in size with greater than 50% respiratory variability, suggesting right atrial pressure of 3 mmHg. FINDINGS  Left Ventricle: Left ventricular ejection fraction, by estimation, is 65 to 70%. The left ventricle has normal function. The left ventricle has no regional wall motion abnormalities. Strain was performed and the global longitudinal strain is indeterminate. The left ventricular internal cavity size was normal in size. There is mild left ventricular hypertrophy. Left ventricular diastolic parameters were  normal. Right Ventricle: The right ventricular size is normal. No increase in right ventricular wall thickness. Right ventricular systolic function is normal. Left Atrium: Left atrial size was moderately dilated. Right Atrium: Right atrial size was normal in size. Pericardium: There is no evidence of pericardial effusion. Mitral Valve: The mitral valve is abnormal. There is mild thickening of the mitral valve leaflet(s). Trivial mitral valve regurgitation. No evidence of mitral valve stenosis. Tricuspid Valve: The tricuspid valve is normal in structure. Tricuspid valve regurgitation is not  demonstrated. No evidence of tricuspid stenosis. Aortic Valve: The aortic valve is tricuspid. There is moderate calcification of the aortic valve. Aortic valve regurgitation is not visualized. Aortic valve sclerosis is present, with no evidence of aortic valve stenosis. Pulmonic Valve: The pulmonic valve was normal in structure. Pulmonic valve regurgitation is not visualized. No evidence of pulmonic stenosis. Aorta: The aortic root is normal in size and structure. Venous: The inferior vena cava is normal in size with greater than 50% respiratory variability, suggesting right atrial pressure of 3 mmHg. IAS/Shunts: No atrial level shunt detected by color flow Doppler. Additional Comments: 3D was performed not requiring image post processing on an independent workstation and was indeterminate.  LEFT VENTRICLE PLAX 2D LVIDd:         5.10 cm   Diastology LVIDs:         2.90 cm   LV e' medial:    7.72 cm/s LV PW:         1.00 cm   LV E/e' medial:  16.7 LV IVS:        1.20 cm   LV e' lateral:   7.62 cm/s LVOT diam:     2.30 cm   LV E/e' lateral: 16.9 LV SV:         182 LV SV Index:   86 LVOT Area:     4.15 cm LV IVRT:       58 msec  RIGHT VENTRICLE             IVC RV S prime:     11.90 cm/s  IVC diam: 2.20 cm TAPSE (M-mode): 1.9 cm                             PULMONARY VEINS                             Diastolic Velocity: 37.20 cm/s                             S/D Velocity:       1.10                             Systolic Velocity:  41.70 cm/s LEFT ATRIUM              Index        RIGHT ATRIUM           Index LA diam:        5.20 cm  2.47 cm/m   RA Area:     18.20 cm LA Vol (A2C):   85.5 ml  40.56 ml/m  RA Volume:   54.10 ml  25.67 ml/m LA Vol (A4C):   121.0 ml 57.41 ml/m LA Biplane Vol: 107.0 ml 50.76 ml/m  AORTIC VALVE LVOT  Vmax:   166.00 cm/s LVOT Vmean:  134.000 cm/s LVOT VTI:    0.438 m  AORTA Ao Root diam: 2.70 cm MITRAL VALVE MV Area (PHT): 2.28 cm     SHUNTS MV Decel Time: 333 msec     Systemic VTI:  0.44 m  MV E velocity: 129.00 cm/s  Systemic Diam: 2.30 cm MV A velocity: 85.50 cm/s MV E/A ratio:  1.51 Maude Emmer MD Electronically signed by Maude Emmer MD Signature Date/Time: 09/13/2024/3:34:33 PM    Final     Patient Profile   78 year old female with past medical history of paroxysmal atrial fibrillation, hypertension, nonobstructive coronary artery disease, previous right nephrectomy, heart failure preserved ejection fraction for evaluation of acute on chronic heart failure and hypertension.  Echocardiogram this admission shows normal LV function, mild left ventricular hypertrophy, moderate left atrial enlargement.  CTA shows no dissection or aneurysm, bilateral pleural effusions, mediastinal lymphadenopathy increased from previous.    Assessment & Plan  1 acute on chronic diastolic congestive heart failure-volume status improving.  Will decrease Lasix  to 40 mg daily and continue spironolactone .  Will consider addition of Jardiance  as an outpatient though she has been incontinent here.     2 hypertension-blood pressure remains elevated.  CTA performed in the emergency room revealed no renal artery stenosis on the left and she has had previous right nephrectomy.  Continue present medications including amlodipine , Avapro .  Increase carvedilol  to 25 mg twice daily and hydralazine  to 50 mg p.o. 3 times daily.  She wonders if her nausea is related to hydralazine .  If it persists we may need to discontinue.     3 paroxysmal atrial fibrillation-patient is in sinus rhythm.  Will continue carvedilol , dofetilide  and Coumadin . Note creatinine 1.65. May need to decrease dofetilide  pending follow-up readings.    4 chronic stage IIIa kidney disease-creatinine 1.65 today.  Likely related to diuresis and patient also had a CTA in the emergency room.  Decrease Lasix  to 40 mg daily.  Continue to follow.   5 coronary artery disease-patient denies chest pain.  She is intolerant to statins.   6  hyperlipidemia-intolerant to statins and previously declined all of her lipid-lowering medications.  Hopefully patient can be discharged in the next 24 to 48 hours.  For questions or updates, please contact [facility] Please consult [email or web address] for contact info under   Signed, Redell Shallow, MD  09/15/2024, 7:29 AM

## 2024-09-15 NOTE — Progress Notes (Signed)
 Pharmacy: Dofetilide  (Tikosyn ) - Follow Up Assessment and Electrolyte Replacement  Pharmacy consulted to assist in monitoring and replacing electrolytes in this 78 y.o. female admitted on 09/12/2024 undergoing dofetilide  re-initiation. First dofetilide  dose: resumed 11/22 ~ 2 AM.  Labs:    Component Value Date/Time   K 3.7 09/15/2024 0124   MG 2.1 09/15/2024 0124     Plan: Potassium: K 3.5-3.7:  Give KCl 40 mEq po x1  (will divide into 2 doses for tolerability)  Magnesium : Mg > 2: No additional supplementation needed   Thank you for allowing pharmacy to participate in this patient's care   [name], [facility] Clinical Pharmacist  09/15/2024 7:51 AM   [facility] pharmacy phone numbers are listed on [email or web address]

## 2024-09-15 NOTE — Progress Notes (Signed)
 [facility]   ROUNDING   NOTE [name] FMW:[id number]  DOB: 01/24/1946  DOA: 09/12/2024  PCP: Domenica Harlene LABOR, MD  09/15/2024,10:18 AM  LOS: 2 days    Code Status: Full code     from: Home   78 year old  HFpEF baseline weight around 250 pounds last office visit/112 kilos-cath 2008 nonobstructive History of poorly controlled hypertension Permanent A-fib on Tikosyn /Coumadin  CHADS2 score >4 Rheumatoid arthritis followed by Dr. 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 Avram deferred to colonoscopy-has hemorrhoidal bleeding occasionally on Anusol  suppositories She is poorly compliant on dietary restrictions-recently started on more medications for blood pressure control  Chronology  11/21 admit from home hypertension with urgency 200s saturations 88% placed on BiPAP BNP 1700 troponin negative  Cardiology consulted   Pertinent imaging/studies till date  CXR cardiomegaly pleural effusion CT angio moderate pleural effusions no lymphadenopathy Echocardiogram 09/13/2024 EF 65-75% no valvular abnormalities   Assessment  & Plan :    Acute hypoxic respiratory status secondary to AECHF preserved EF Hypertensive urgency on admission Appreciative of cardiology input-current meds  Lasix  40 daily IV, Avapro  300 Aldactone  12.5 Coreg  25 twice daily --I's/O- 2.4 L weight down from 1 10-105 kg Desatted slightly with ambulation repeat desat screen in the next several days Hypertensive urgency still present Continue hydralazine  50 every 8 p.o. in addition to amlodipine  10 Might need to add clonidine ? Currently-has PRNs in place for blood pressure >180 History of solitary kidney Careful diuresis Permanent A-fib on Tikosyn  CHADVASC =5 Tikosyn  375 twice daily, a.m. magnesium -Coreg  as above-per cardiology Predominantly currently in sinus rhythm with some PVCs Coumadin   dosing per pharmacist-therapeutic Rheumatoid arthritis follows with Dr. Mai Continues Remicade  and methotrexate as outpatient ?  Alternate to Remicade -CC Dr. Mai at discharge Ulcerative colitis internal hemorrhoids follows Dr. Avram Anusol  suppositories if needed for hemorrhoids Had 2 formed stools earlier today seems sensitive to several medications?  She did not like the potassium and cause stomach upset see below hydralazine  If diarrhea abates no further workup-if worsens pathogen panel C. difficile fecal lactoferrin and discussion with GI regarding other options Reflux Substituted omeprazole for pantoprazole  40 daily but had issues taking the potassium replacement which has been held Placed on Protonix  40 twice daily follow trends Morbid obesity stage grade 2 OSA supposed to be on BiPAP at bedtime BiPAP nightly   Data Reviewed today:   Sodium 137 potassium 3.7 chloride 102 bicarb 26 BUN/creatinine 26/1.6 White count 8.7 hemoglobin 10 platelet 195  DVT prophylaxis: Coumadin   Status is: Inpatient Inpatient pending resolution    Dispo/Global plan: Unclear at this time still diuresing    Time 40   Subjective:   Overall feels less short of breath Did not like the potassium pill because stomach upset and she went back to sleep 2 formed stools today a little bit nausea with potassium She had 1 loose stool subsequently She has no chest pain She seems to be breathing better per her own admission    Objective + exam Vitals:   09/15/24 0124 09/15/24 0200 09/15/24 0500 09/15/24 0748  BP: (!) 189/57 (!) 161/52 (!) 176/62 (!) 185/68  Pulse: 62 71 71 61  Resp: (!) 24  (!) 23 20  Temp:   97.7 F (36.5 C) 98.2 F (36.8 C)  TempSrc:   Oral Oral  SpO2: 98% 98% 97%   Weight:   105.6 kg  Height:       Filed Weights   09/12/24 1434 09/14/24 0400 09/15/24 0500  Weight: 111.1 kg 110.2 kg 105.6 kg     Examination: Pleasant female no distress no JVD Chest is  clear anteriorly decreased crackles posterolaterally No significant JVD at this time Grade 1 lower extremity edema Power 5/5 Coherent awake alert  Scheduled Meds:  acidophilus  1 capsule Oral Daily   amLODipine   10 mg Oral Daily   carvedilol   25 mg Oral BID WC   dofetilide   375 mcg Oral BID   escitalopram   10 mg Oral Daily   fentaNYL  (SUBLIMAZE ) injection  50 mcg Intravenous Once   folic acid   1 mg Oral BID   [START ON 09/16/2024] furosemide   40 mg Intravenous Daily   hydrALAZINE   50 mg Oral Q8H   irbesartan   300 mg Oral Daily   pantoprazole   40 mg Oral Daily   sodium chloride  flush  3 mL Intravenous Q12H   spironolactone   12.5 mg Oral Daily   warfarin  1.25 mg Oral Once per day on Sunday Tuesday Thursday   And   warfarin  2.5 mg Oral Once per day on Monday Wednesday Friday Saturday   Warfarin - Pharmacist Dosing Inpatient   Does not apply q1600   Continuous Infusions: acetaminophen  **OR** acetaminophen , hydrALAZINE , ipratropium-albuterol , loperamide  HCl  Jai-Gurmukh Zebastian Carico, MD  [facility]

## 2024-09-16 ENCOUNTER — Ambulatory Visit

## 2024-09-16 DIAGNOSIS — J9601 Acute respiratory failure with hypoxia: Secondary | ICD-10-CM | POA: Diagnosis not present

## 2024-09-16 DIAGNOSIS — I5033 Acute on chronic diastolic (congestive) heart failure: Secondary | ICD-10-CM | POA: Diagnosis not present

## 2024-09-16 DIAGNOSIS — I480 Paroxysmal atrial fibrillation: Secondary | ICD-10-CM | POA: Diagnosis not present

## 2024-09-16 DIAGNOSIS — I10 Essential (primary) hypertension: Secondary | ICD-10-CM | POA: Diagnosis not present

## 2024-09-16 LAB — BASIC METABOLIC PANEL WITH GFR
Anion gap: 10 (ref 5–15)
BUN: 29 mg/dL — ABNORMAL HIGH (ref 8–23)
CO2: 25 mmol/L (ref 22–32)
Calcium: 7.9 mg/dL — ABNORMAL LOW (ref 8.9–10.3)
Chloride: 99 mmol/L (ref 98–111)
Creatinine, Ser: 1.68 mg/dL — ABNORMAL HIGH (ref 0.44–1.00)
GFR, Estimated: 31 mL/min — ABNORMAL LOW (ref >60)
Glucose, Bld: 89 mg/dL (ref 70–99)
Potassium: 3.8 mmol/L (ref 3.5–5.1)
Sodium: 134 mmol/L — ABNORMAL LOW (ref 135–145)

## 2024-09-16 LAB — CBC
HCT: 28.5 % — ABNORMAL LOW (ref 36.0–46.0)
Hemoglobin: 9.1 g/dL — ABNORMAL LOW (ref 12.0–15.0)
MCH: 32.4 pg (ref 26.0–34.0)
MCHC: 31.9 g/dL (ref 30.0–36.0)
MCV: 101.4 fL — ABNORMAL HIGH (ref 80.0–100.0)
Platelets: 188 K/uL (ref 150–400)
RBC: 2.81 MIL/uL — ABNORMAL LOW (ref 3.87–5.11)
RDW: 15.8 % — ABNORMAL HIGH (ref 11.5–15.5)
WBC: 9.4 K/uL (ref 4.0–10.5)
nRBC: 0 % (ref 0.0–0.2)

## 2024-09-16 LAB — MAGNESIUM: Magnesium: 2 mg/dL (ref 1.7–2.4)

## 2024-09-16 LAB — PROTIME-INR
INR: 2.7 — ABNORMAL HIGH (ref 0.8–1.2)
Prothrombin Time: 30.3 s — ABNORMAL HIGH (ref 11.4–15.2)

## 2024-09-16 MED ORDER — WARFARIN SODIUM 1 MG PO TABS
1.0000 mg | ORAL_TABLET | Freq: Once | ORAL | Status: AC
  Administered 2024-09-16: 1 mg via ORAL
  Filled 2024-09-16: qty 1

## 2024-09-16 MED ORDER — FLUTICASONE PROPIONATE 50 MCG/ACT NA SUSP
1.0000 | Freq: Every day | NASAL | Status: DC
  Administered 2024-09-16: 1 via NASAL
  Filled 2024-09-16: qty 16

## 2024-09-16 MED ORDER — MAGNESIUM SULFATE 2 GM/50ML IV SOLN
2.0000 g | Freq: Once | INTRAVENOUS | Status: AC
  Administered 2024-09-16: 2 g via INTRAVENOUS
  Filled 2024-09-16: qty 50

## 2024-09-16 MED ORDER — POTASSIUM CHLORIDE 20 MEQ PO PACK
40.0000 meq | PACK | Freq: Once | ORAL | Status: AC
  Administered 2024-09-16: 40 meq via ORAL
  Filled 2024-09-16: qty 2

## 2024-09-16 NOTE — Progress Notes (Signed)
  Progress Note  Patient Name: [name] Date of Encounter: 09/16/2024 [facility] Cardiologist: Redell Shallow, MD   Interval Summary   Feels better, brushing teeth.   Vital Signs Vitals:   09/15/24 2346 09/16/24 0357 09/16/24 0500 09/16/24 0834  BP: (!) 129/50 (!) (P) 142/47  (!) 162/67  Pulse: (!) 59 (P) 60  65  Resp: 14 (P) 17  13  Temp:  (P) 98.9 F (37.2 C)  97.6 F (36.4 C)  TempSrc:  (P) Axillary  Oral  SpO2: 97% (P) 96%  95%  Weight:   108 kg   Height:        Intake/Output Summary (Last 24 hours) at 09/16/2024 1132 Last data filed at 09/16/2024 0948 Gross per 24 hour  Intake 333 ml  Output --  Net 333 ml      09/16/2024    5:00 AM 09/15/2024    5:00 AM 09/14/2024    4:00 AM  Last 3 Weights  Weight (lbs) 238 lb 1.6 oz 232 lb 14.4 oz 242 lb 15.2 oz  Weight (kg) 108 kg 105.643 kg 110.2 kg      Telemetry/ECG  NSR - Personally Reviewed  Physical Exam  GEN: No acute distress.   Neck: No JVD Cardiac: RRR, no murmurs, rubs, or gallops.  Respiratory: Clear to auscultation bilaterally. GI: Soft, nontender, non-distended  MS: No edema  Assessment & Plan   78 year old with acute on chronic diastolic heart failure with hypertension paroxysmal atrial fibrillation chronic kidney disease stage IIIa coronary disease hyperlipidemia.  Acute on chronic diastolic heart failure-proBNP was 1700 - Lasix  was decreased to 40 mg a day on 09/15/2024. - Continuing with spironolactone  -Hesitant about use of SGLT2 inhibitor because of incontinence.  Hypertension - No evidence of renal artery stenosis from CTA performed in the emergency room. -Prior right nephrectomy. -Amlodipine  10 mg, carvedilol  25 mg twice a day, hydralazine  50 mg every 8 hours, irbesartan  300 mg a day, spironolactone  12.5 mg a day-potassium 3.7-4.0. -Increased carvedilol  to 25 mg twice a day on 09/12/2024 and hydralazine  to 50 mg 3 times a day.  May be having some nausea related to  hydralazine .  Paroxysmal atrial fibrillation - Patient is in sinus rhythm - Continue with carvedilol  dofetilide  Coumadin , INR 2.1, for anticoagulation.  Chronically disease stage IIIa - Creatinine 1.5-1.6, avoid nephrotoxic agents - Continuing with Lasix  40 mg a day.  Stable.  CAD - No chest pain. -Unable to take statins.  Intolerant.  Previously declined all lipid-lowering agents.  Seems to be close to DC.    For questions or updates, please contact [facility] Please consult [email or web address] for contact info under         Signed, Oneil Parchment, MD

## 2024-09-16 NOTE — Progress Notes (Signed)
 [facility]   ROUNDING   NOTE [name] FMW:[id number]  DOB: 1946-05-25  DOA: 09/12/2024  PCP: Domenica Harlene LABOR, MD  09/16/2024,2:09 PM  LOS: 3 days    Code Status: Full code     from: Home   78 year old  HFpEF baseline weight around 250 pounds last office visit/112 kilos-cath 2008 nonobstructive History of poorly controlled hypertension Permanent A-fib on Tikosyn /Coumadin  CHADS2 score >4 Rheumatoid arthritis followed by Dr. 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 Avram deferred to colonoscopy-has hemorrhoidal bleeding occasionally on Anusol  suppositories She is poorly compliant on dietary restrictions-recently started on more medications for blood pressure control  Chronology  11/21 admit from home hypertension with urgency 200s saturations 88% placed on BiPAP BNP 1700 troponin negative  Cardiology consulted   Pertinent imaging/studies till date  CXR cardiomegaly pleural effusion CT angio moderate pleural effusions no lymphadenopathy Echocardiogram 09/13/2024 EF 65-75% no valvular abnormalities   Assessment  & Plan :    Acute hypoxic respiratory status secondary to AECHF preserved EF Hypertensive urgency on admission --- Lasix  40 daily IV, Avapro  300 Aldactone  12.5 Coreg  25 twice daily--- Fluid restrict per diet orders, -2.3 L weight 110-->108---purewick not working---if still no good output needs bladder scan Hypertension improved, continue hydralazine  50 p.o. Q8, amlodipine  10 Currently-has PRNs in place for blood pressure >180 Doesn't use oxygen at home--get desat screen History of solitary kidney Careful diuresis-creatinine overall stable Permanent A-fib on Tikosyn  CHADVASC =5 Tikosyn  375 twice daily rate control as above with Coreg  Rest as per cardiology Rheumatoid arthritis follows with Dr. Mai Continues Remicade  and methotrexate as  outpatient ?  Alternate to Remicade -CC Dr. Mai at discharge Ulcerative colitis internal hemorrhoids follows Dr. Avram No diarrhea today CC Avram as OP to set up close follow up Reflux Substituted omeprazole for pantoprazole  40 daily but had issues taking the potassium replacement which has been held Placed on Protonix  40 twice daily follow trends Morbid obesity stage grade 2 OSA supposed to be on BiPAP at bedtime BiPAP nightly   Data Reviewed today:   Sodium 134 potassium 3.8 CO2 25 BUN/creatinine 29/1.68 WBC 9.4 hemoglobin 9.1 platelet 188 INR 2.7  DVT prophylaxis: Coumadin   Status is: Inpatient Inpatient pending resolution    Dispo/Global plan: Unclear at this time still diuresing    Time 40   Subjective:   Well no distress no fever no chills Looks better eating drinking ok no n/v today  Only 1 loose BM   Objective + exam Vitals:   09/16/24 0357 09/16/24 0500 09/16/24 0834 09/16/24 1147  BP: (!) (P) 142/47  (!) 162/67 (!) 142/48  Pulse: (P) 60  65 (!) 52  Resp: (P) 17  13 20   Temp: (P) 98.9 F (37.2 C)  97.6 F (36.4 C) 97.7 F (36.5 C)  TempSrc: (P) Axillary  Oral Oral  SpO2: (P) 96%  95% 92%  Weight:  108 kg    Height:       Filed Weights   09/14/24 0400 09/15/24 0500 09/16/24 0500  Weight: 110.2 kg 105.6 kg 108 kg     Examination: Pleasant female no distress no JVD Chest is clear anteriorly more clear posteriorly No significant JVD at this time Grade 1 lower extremity edema Power 5/5 Coherent awake alert  Scheduled Meds:  acidophilus  1 capsule Oral Daily   amLODipine   10 mg Oral Daily   carvedilol   25  mg Oral BID WC   dofetilide   375 mcg Oral BID   escitalopram   10 mg Oral Daily   fentaNYL  (SUBLIMAZE ) injection  50 mcg Intravenous Once   folic acid   1 mg Oral BID   furosemide   40 mg Intravenous Daily   hydrALAZINE   50 mg Oral Q8H   irbesartan   300 mg Oral Daily   pantoprazole   40 mg Oral BID   sodium chloride  flush  3 mL  Intravenous Q12H   spironolactone   12.5 mg Oral Daily   warfarin  1 mg Oral ONCE-1600   Warfarin - Pharmacist Dosing Inpatient   Does not apply q1600   Continuous Infusions: acetaminophen  **OR** acetaminophen , hydrALAZINE , ipratropium-albuterol , loperamide  HCl  Jai-Gurmukh Vaughn Beaumier, MD  [facility]

## 2024-09-16 NOTE — Progress Notes (Signed)
 Pharmacy: Dofetilide  (Tikosyn ) - Follow Up Assessment and Electrolyte Replacement  Pharmacy consulted to assist in monitoring and replacing electrolytes in this 78 y.o. female admitted on 09/12/2024 undergoing dofetilide  re-initiation. First dofetilide  dose:  resumed 11/22 ~ 2 AM.  Labs:    Component Value Date/Time   K 3.8 09/16/2024 0214   MG 2.0 09/16/2024 0214     Plan: Potassium: K 3.8-3.9:  Give KCl 40 mEq po x1   Magnesium : Mg 1.8-2: Give Mg 2 gm IV x1    Thank you for allowing pharmacy to participate in this patient's care   [employer], Pharm.D., BCPS Clinical Pharmacist Clinical phone for 09/16/2024 from 7:30-3:00 is [phone].  **Pharmacist phone directory can be found on [email or web address] listed under [facility].  09/16/2024 7:49 AM

## 2024-09-16 NOTE — Progress Notes (Signed)
 PHARMACY - ANTICOAGULATION CONSULT NOTE  Pharmacy Consult for warfarin Indication: atrial fibrillation  Patient Measurements: Height: 5' 3 (160 cm) Weight: 108 kg (238 lb 1.6 oz) IBW/kg (Calculated) : 52.4 HEPARIN DW (KG): 79.2  Vital Signs: Temp: 97.6 F (36.4 C) (11/24 0834) Temp Source: Oral (11/24 0834) BP: 162/67 (11/24 0834) Pulse Rate: 65 (11/24 0834)  Labs: Recent Labs    09/14/24 0734 09/15/24 0124 09/15/24 2148 09/16/24 0214  HGB 10.1* 10.0*  --  9.1*  HCT 30.7* 30.7*  --  28.5*  PLT 191 195  --  188  LABPROT 24.9* 26.8*  --  30.3*  INR 2.1* 2.3*  --  2.7*  CREATININE 1.49* 1.65* 1.67* 1.68*    Estimated Creatinine Clearance: 32 mL/min (A) (by C-G formula based on SCr of 1.68 mg/dL (H)).   Medical History: Past Medical History:  Diagnosis Date   Allergy    Anxiety    Arthritis    Atrial fibrillation (HCC) 2008   Blood transfusion without reported diagnosis    CAD (coronary artery disease)    Crohn's colitis (HCC)    she reports ulcerative colitis beginning in her 62's, biopsies 2008 suggest Crohn's not UC   Depression    Diverticulosis    GERD (gastroesophageal reflux disease)    Hyperlipidemia    Hypertension    IBS (irritable bowel syndrome)    Keloid of skin    Obesity    OSA (obstructive sleep apnea)    Pancreatitis    Paroxysmal atrial fibrillation (HCC)    Pseudoaneurysm of right femoral artery    Renal oncocytoma    s/p right nephrectomy   Rheumatoid arthritis (HCC) 05/29/2017   Sleep apnea    UC (ulcerative colitis) (HCC)     Medications:  -Warfarin 1.25mg  PO every Sun, Tues, Thurs; 2.5mg  PO all other days   Assessment: 50 yoF presented with shortness of breath. Pharmacy consulted to dose warfarin for history of atrial fibrillation.  INR trending up.  Will reduce dose this PM.  -Hgb 9.1, plts 188 -INR 2.3 > 2.7  Goal of Therapy:  INR 2-3 Monitor platelets by anticoagulation protocol: Yes   Plan:  -Warfarin 1mg  PO x 1  tonight -INR/CBC daily  [employer], Pharm.D., BCPS Clinical Pharmacist Clinical phone for 09/16/2024 from 7:30-3:00 is [phone].  **Pharmacist phone directory can be found on [email or web address] listed under [facility].  09/16/2024 10:34 AM

## 2024-09-16 NOTE — Progress Notes (Signed)
 Physical Therapy Treatment Patient Details Name: [name] MRN: [id number] DOB: 1946/07/07 Today's Date: 09/16/2024   History of Present Illness The pt is a 78 yo female presenting 11/20 with SOB x1 month. Work up revealed acute resp failure with hypoxia due to acute CHF exacerbation. PMH includes: afib, HTN, CAD, R nephrectomy, HFpEF.    PT Comments  The pt was agreeable to session, able to demo good improvement in activity tolerance and ambulation distance. Pt continues to demo good ability for sit-stand transfers without assistance, but did benefit from cues for hand positioning on seated surface to improve technique and power up to standing. Pt also continues to need increase to 3L O2 for hallway ambulation, benefits from cues for PLB and slowed breaths with exertion. Educated on progressive walking program and repeated sit-stand transfers for continued activity at home, pt and spouse express understanding.    If plan is discharge home, recommend the following: A little help with walking and/or transfers;A lot of help with bathing/dressing/bathroom;Help with stairs or ramp for entrance   Can travel by private vehicle        Equipment Recommendations  Other (comment) (oxygen)    Recommendations for Other Services       Precautions / Restrictions Precautions Precautions: Fall Recall of Precautions/Restrictions: Intact Precaution/Restrictions Comments: 2-3L O2 Restrictions Weight Bearing Restrictions Per Provider Order: No     Mobility  Bed Mobility Overal bed mobility: Independent             General bed mobility comments: sitting EOB upon arrival    Transfers Overall transfer level: Needs assistance Equipment used: Rollator (4 wheels) Transfers: Sit to/from Stand, Bed to chair/wheelchair/BSC Sit to Stand: Supervision   Step pivot transfers: Supervision       General transfer comment: cues for use of hands on bed surface to rise, no physical assist or  need to stedy    Ambulation/Gait Ambulation/Gait assistance: Supervision Gait Distance (Feet): 250 Feet Assistive device: Rollator (4 wheels) Gait Pattern/deviations: Step-through pattern, Decreased stride length Gait velocity: 0.5 m/s Gait velocity interpretation: 1.31 - 2.62 ft/sec, indicative of limited community ambulator   General Gait Details: pt with slow, guarded gait but stable. 75 ft on 2L with SpO2 to 87%, 3L to recover to 90s then walked 17ft back to room with SpO2 >94%     Balance Overall balance assessment: Needs assistance Sitting-balance support: No upper extremity supported, Feet supported Sitting balance-Leahy Scale: Good     Standing balance support: Bilateral upper extremity supported, During functional activity Standing balance-Leahy Scale: Fair Standing balance comment: can static stand without UE support, prefers BUE support for gait                            Communication Communication Communication: Impaired Factors Affecting Communication: Hearing impaired (bilateral hearing aides)  Cognition Arousal: Alert Behavior During Therapy: WFL for tasks assessed/performed   PT - Cognitive impairments: No apparent impairments                       PT - Cognition Comments: WFL, limited by [facility] Following commands: Intact      Cueing Cueing Techniques: Verbal cues  Exercises Other Exercises Other Exercises: sit-stand from EOB x 4    General Comments General comments (skin integrity, edema, etc.): SpO2 to 87% on 2L with gait, improved to 93-95% on 3L with 126ft ambulation      Pertinent Vitals/Pain Pain Assessment  Pain Assessment: No/denies pain Pain Intervention(s): Monitored during session    Home Living Family/patient expects to be discharged to:: Private residence Living Arrangements: Spouse/significant other Available Help at Discharge: Family;Available 24 hours/day Type of Home: House Home Access: Stairs to enter;Ramped  entrance (ramp to 1 stair) Entrance Stairs-Rails: Right;Left Entrance Stairs-Number of Steps: 1   Home Layout: One level Home Equipment: Tub bench;Grab bars - toilet;Grab bars - tub/shower;Hand held shower head;Rollator (4 wheels);Lift chair          PT Goals (current goals can now be found in the care plan section) Acute Rehab PT Goals Patient Stated Goal: to return home PT Goal Formulation: With patient Time For Goal Achievement: 09/28/24 Potential to Achieve Goals: Good Progress towards PT goals: Progressing toward goals    Frequency    Min 2X/week       AM-PAC PT 6 Clicks Mobility   Outcome Measure  Help needed turning from your back to your side while in a flat bed without using bedrails?: None Help needed moving from lying on your back to sitting on the side of a flat bed without using bedrails?: None Help needed moving to and from a bed to a chair (including a wheelchair)?: A Little Help needed standing up from a chair using your arms (e.g., wheelchair or bedside chair)?: A Little Help needed to walk in hospital room?: A Little Help needed climbing 3-5 steps with a railing? : A Little 6 Click Score: 20    End of Session Equipment Utilized During Treatment: Gait belt;Oxygen Activity Tolerance: Patient tolerated treatment well Patient left: in bed;with call bell/phone within reach;with family/visitor present;with nursing/sitter in room (sitting EOB) Nurse Communication: Mobility status;Other (comment) (O2 needs) PT Visit Diagnosis: Unsteadiness on feet (R26.81);Other abnormalities of gait and mobility (R26.89);Muscle weakness (generalized) (M62.81)     Time: 9179-9158 PT Time Calculation (min) (ACUTE ONLY): 21 min  Charges:    $Therapeutic Exercise: 8-22 mins PT General Charges $$ ACUTE PT VISIT: 1 Visit                     Izetta Call, PT, DPT   Acute Rehabilitation Department Office [phone] Secure Chat Communication Preferred   Izetta JULIANNA Call 09/16/2024, 9:53 AM

## 2024-09-16 NOTE — Plan of Care (Signed)
  Problem: Education: Goal: Knowledge of General Education information will improve Description: Including pain rating scale, medication(s)/side effects and non-pharmacologic comfort measures Outcome: Progressing   Problem: Health Behavior/Discharge Planning: Goal: Ability to manage health-related needs will improve Outcome: Progressing   Problem: Clinical Measurements: Goal: Ability to maintain clinical measurements within normal limits will improve Outcome: Progressing Goal: Will remain free from infection Outcome: Progressing Goal: Diagnostic test results will improve Outcome: Progressing Goal: Respiratory complications will improve Outcome: Progressing Goal: Cardiovascular complication will be avoided Outcome: Progressing   Problem: Activity: Goal: Risk for activity intolerance will decrease Outcome: Progressing   Problem: Nutrition: Goal: Adequate nutrition will be maintained Outcome: Progressing   Problem: Coping: Goal: Level of anxiety will decrease Outcome: Progressing   Problem: Elimination: Goal: Will not experience complications related to bowel motility Outcome: Progressing Goal: Will not experience complications related to urinary retention Outcome: Progressing   Problem: Pain Managment: Goal: General experience of comfort will improve and/or be controlled Outcome: Progressing   Problem: Safety: Goal: Ability to remain free from injury will improve Outcome: Progressing   Problem: Skin Integrity: Goal: Risk for impaired skin integrity will decrease Outcome: Progressing   Problem: Education: Goal: Ability to demonstrate management of disease process will improve Outcome: Progressing Goal: Ability to verbalize understanding of medication therapies will improve Outcome: Progressing Goal: Individualized Educational Video(s) Outcome: Progressing   Problem: Activity: Goal: Capacity to carry out activities will improve Outcome: Progressing    Problem: Cardiac: Goal: Ability to achieve and maintain adequate cardiopulmonary perfusion will improve Outcome: Progressing   Problem: Education: Goal: Knowledge of disease or condition will improve Outcome: Progressing Goal: Understanding of medication regimen will improve Outcome: Progressing Goal: Individualized Educational Video(s) Outcome: Progressing   Problem: Activity: Goal: Ability to tolerate increased activity will improve Outcome: Progressing   Problem: Cardiac: Goal: Ability to achieve and maintain adequate cardiopulmonary perfusion will improve Outcome: Progressing   Problem: Health Behavior/Discharge Planning: Goal: Ability to safely manage health-related needs after discharge will improve Outcome: Progressing

## 2024-09-17 DIAGNOSIS — N1831 Chronic kidney disease, stage 3a: Secondary | ICD-10-CM | POA: Diagnosis not present

## 2024-09-17 DIAGNOSIS — I10 Essential (primary) hypertension: Secondary | ICD-10-CM | POA: Diagnosis not present

## 2024-09-17 DIAGNOSIS — J9601 Acute respiratory failure with hypoxia: Secondary | ICD-10-CM | POA: Diagnosis not present

## 2024-09-17 DIAGNOSIS — I5033 Acute on chronic diastolic (congestive) heart failure: Secondary | ICD-10-CM | POA: Diagnosis not present

## 2024-09-17 DIAGNOSIS — I480 Paroxysmal atrial fibrillation: Secondary | ICD-10-CM | POA: Diagnosis not present

## 2024-09-17 LAB — COMPREHENSIVE METABOLIC PANEL WITH GFR
ALT: 14 U/L (ref 0–44)
AST: 17 U/L (ref 15–41)
Albumin: 2.3 g/dL — ABNORMAL LOW (ref 3.5–5.0)
Alkaline Phosphatase: 49 U/L (ref 38–126)
Anion gap: 11 (ref 5–15)
BUN: 36 mg/dL — ABNORMAL HIGH (ref 8–23)
CO2: 24 mmol/L (ref 22–32)
Calcium: 8 mg/dL — ABNORMAL LOW (ref 8.9–10.3)
Chloride: 100 mmol/L (ref 98–111)
Creatinine, Ser: 1.68 mg/dL — ABNORMAL HIGH (ref 0.44–1.00)
GFR, Estimated: 31 mL/min — ABNORMAL LOW (ref >60)
Glucose, Bld: 82 mg/dL (ref 70–99)
Potassium: 4.1 mmol/L (ref 3.5–5.1)
Sodium: 135 mmol/L (ref 135–145)
Total Bilirubin: 0.7 mg/dL (ref 0.0–1.2)
Total Protein: 5.6 g/dL — ABNORMAL LOW (ref 6.5–8.1)

## 2024-09-17 LAB — C DIFFICILE (CDIFF) QUICK SCRN (NO PCR REFLEX)
C Diff antigen: NEGATIVE
C Diff interpretation: NOT DETECTED
C Diff toxin: NEGATIVE

## 2024-09-17 LAB — PROTIME-INR
INR: 2.4 — ABNORMAL HIGH (ref 0.8–1.2)
Prothrombin Time: 27.7 s — ABNORMAL HIGH (ref 11.4–15.2)

## 2024-09-17 MED ORDER — DOFETILIDE 250 MCG PO CAPS
250.0000 ug | ORAL_CAPSULE | Freq: Two times a day (BID) | ORAL | Status: DC
  Administered 2024-09-17 – 2024-09-18 (×2): 250 ug via ORAL
  Filled 2024-09-17 (×2): qty 1

## 2024-09-17 MED ORDER — WARFARIN 1.25 MG HALF TABLET
1.2500 mg | ORAL_TABLET | Freq: Once | ORAL | Status: AC
  Administered 2024-09-17: 1.25 mg via ORAL
  Filled 2024-09-17: qty 1

## 2024-09-17 MED ORDER — LOPERAMIDE HCL 1 MG/7.5ML PO SUSP
2.0000 mg | ORAL | Status: DC | PRN
Start: 2024-09-17 — End: 2024-09-18
  Administered 2024-09-17: 2 mg via ORAL
  Filled 2024-09-17 (×3): qty 15

## 2024-09-17 NOTE — Progress Notes (Signed)
  Progress Note  Patient Name: [name] Date of Encounter: 09/17/2024 [facility] Cardiologist: Redell Shallow, MD   Interval Summary    No chest pain, feeling overall better.  Breathing is comfortable.  Family surrounding her.  Tikosyn  decreased to 250.  GFR currently 45, creatinine 1.68 and stable  She did have some loose stool.  States that this is a UC issue.  She is requesting chicken broth/liquid.  She has had UC for over 30 years.  Vital Signs Vitals:   09/17/24 0315 09/17/24 0500 09/17/24 0722 09/17/24 0809  BP: (!) 163/56  (!) 140/50   Pulse: (!) 51  (!) 51   Resp: 18  18   Temp: 98.5 F (36.9 C)  98.2 F (36.8 C)   TempSrc: Axillary  Oral   SpO2: 96%  98% 92%  Weight:  106 kg    Height:        Intake/Output Summary (Last 24 hours) at 09/17/2024 0854 Last data filed at 09/17/2024 0300 Gross per 24 hour  Intake 123 ml  Output 900 ml  Net -777 ml      09/17/2024    5:00 AM 09/16/2024    5:00 AM 09/15/2024    5:00 AM  Last 3 Weights  Weight (lbs) 233 lb 11 oz 238 lb 1.6 oz 232 lb 14.4 oz  Weight (kg) 106 kg 108 kg 105.643 kg      Telemetry/ECG  NSR, stable- Personally Reviewed  Physical Exam  GEN: No acute distress.   Neck: No JVD Cardiac: RRR, no murmurs, rubs, or gallops.  Respiratory: Clear to auscultation bilaterally. GI: Soft, nontender, non-distended  MS: No edema No change in exam  Assessment & Plan   78 year old with acute on chronic diastolic heart failure with hypertension paroxysmal atrial fibrillation chronic kidney disease stage IIIa coronary disease hyperlipidemia.  Acute on chronic diastolic heart failure-proBNP was 1700 - Lasix  was decreased to 40 mg a day on 09/15/2024.  Creatinine is stable. - Continuing with spironolactone  -Hesitant about use of SGLT2 inhibitor because of incontinence. -No changes  Hypertension - No evidence of renal artery stenosis from CTA performed in the emergency room. -Prior right  nephrectomy. -Amlodipine  10 mg, carvedilol  25 mg twice a day, hydralazine  50 mg every 8 hours, irbesartan  300 mg a day, spironolactone  12.5 mg a day-potassium 3.7-4.0. -Increased carvedilol  to 25 mg twice a day on 09/12/2024 and hydralazine  to 50 mg 3 times a day.  May be having some nausea related to hydralazine . -Continue with current plan  Paroxysmal atrial fibrillation - Patient is in sinus rhythm - Continue with carvedilol  dofetilide  which has been decreased to 250 mcg twice daily Coumadin , INR 2.4, for anticoagulation.  No changes made  Chronically disease stage IIIa - Creatinine 1.5-1.6, avoid nephrotoxic agents - Continuing with Lasix  40 mg a day.  Stable.  CAD - No chest pain. -Unable to take statins.  Intolerant.  Previously declined all lipid-lowering agents.  Would not be opposed to discharge from cardiovascular perspective.   For questions or updates, please contact [facility] Please consult [email or web address] for contact info under         Signed, Oneil Parchment, MD

## 2024-09-17 NOTE — Plan of Care (Signed)
 Alert and oriented.  OOB to chair with OT.  Continue plan of care.   Problem: Education: Goal: Knowledge of General Education information will improve Description: Including pain rating scale, medication(s)/side effects and non-pharmacologic comfort measures Outcome: Progressing   Problem: Health Behavior/Discharge Planning: Goal: Ability to manage health-related needs will improve Outcome: Progressing   Problem: Clinical Measurements: Goal: Ability to maintain clinical measurements within normal limits will improve Outcome: Progressing Goal: Will remain free from infection Outcome: Progressing Goal: Diagnostic test results will improve Outcome: Progressing

## 2024-09-17 NOTE — Progress Notes (Addendum)
 TR[facility]   ROUNDING   NOTE [name] FMW:[id number]  DOB: 05-Apr-1946  DOA: 09/12/2024  PCP: Domenica Harlene LABOR, MD  09/17/2024,2:43 PM  LOS: 4 days    Code Status: Full code     from: Home   78 year old  HFpEF baseline weight around 250 pounds last office visit/112 kilos-cath 2008 nonobstructive History of poorly controlled hypertension Permanent A-fib on Tikosyn /Coumadin  CHADS2 score >4 Rheumatoid arthritis followed by Dr. 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 Avram deferred to colonoscopy-has hemorrhoidal bleeding occasionally on Anusol  suppositories She is poorly compliant on dietary restrictions-recently started on more medications for blood pressure control  Chronology  11/21 admit from home hypertension with urgency 200s saturations 88% placed on BiPAP BNP 1700 troponin negative  Cardiology consulted   Pertinent imaging/studies till date  CXR cardiomegaly pleural effusion CT angio moderate pleural effusions no lymphadenopathy Echocardiogram 09/13/2024 EF 65-75% no valvular abnormalities   Assessment  & Plan :    Acute hypoxic respiratory status secondary to AECHF preserved EF Hypertensive urgency on admission ---, Avapro  300 Aldactone  12.5 Coreg  25 twice daily--- She still has some volume on her based on my assessment of her JVD but we need to be cautious as below given her kidney dysfunction switched her today to oral Lasix  40 as she was on IV Lasix  until 11/24 Fluid restrict per diet orders, -3.8 Hypertension improved, unfortunately have to hold the hydralazine  and see if this is the causative agent Continues now amlodipine  10 Currently-has PRNs in place for blood pressure >180 and blood pressures are improved Doesn't use oxygen at home--get desat screen History of solitary kidney Careful diuresis-creatinine overall stable but still  some Permanent A-fib on Tikosyn  CHADVASC =5 Tikosyn  375 twice daily rate control as above with Coreg  Rest as per cardiology Rheumatoid arthritis follows with Dr. Mai Solo Remicade  and methotrexate as outpatient ?  Alternate to Remicade -CC Dr. Mai at discharge Crohn's colitis--internal hemorrhoids follows Dr. Avram Diarrhea since 03/2024 after visit with Dr. Avram Recurrence of diarrhea today leading me to speak with GI She suspects that hydralazine  is causing the diarrhea but we might be stuck with using this as she had hypertensive urgency-May trial alternate agent as per cardiology She was on Imodium  this hospital stay --lower than her usual dose-await below labs and maybe resume this scheduled Obtain pathogen panel C. difficile lactoferrin stat and evaluate for infection As no scope recently difficult to figure out DMA RD planning-await further discussion with GI and appreciative of their advice Reflux Substituted omeprazole for pantoprazole  40 daily but had issues taking the potassium replacement which has been held Placed on Protonix  40 twice daily follow trends-would keep Protonix  unless we have a positive C. difficile Morbid obesity stage grade 2 OSA supposed to be on BiPAP at bedtime BiPAP nightly   Data Reviewed today:   Sodium 135 potassium 4.1 BUN/creatinine 36/1.6 INR is 2.4  DVT prophylaxis: Coumadin   Status is: Inpatient Inpatient pending resolution    Dispo/Global plan: Unclear at this time still diuresing    Time 40   Subjective:   4 episodes of diarrhea greenish in consistency no dark no tarry consistency Breathing better A little distressed about the stool No fever no chills    Objective + exam Vitals:   09/17/24 0500 09/17/24 0722 09/17/24 0809 09/17/24 1114  BP:  (!) 140/50  (!) 153/54  Pulse:  (!) 51  ROLLEN)  53  Resp:  18  (!) 22  Temp:  98.2 F (36.8 C)  98 F (36.7 C)  TempSrc:  Oral  Oral  SpO2:  98% 92% 93%  Weight: 106  kg     Height:       Filed Weights   09/15/24 0500 09/16/24 0500 09/17/24 0500  Weight: 105.6 kg 108 kg 106 kg     Examination: Pleasant female no distress no JVD Chest is clear anteriorly more clear posteriorly Mild JVD at 30 degrees S1-S2 no murmur Abdomen soft no distention no rebound Grade 1 lower extremity edema ROM is intact Power is 5/5 she is quite hard of hearing  Scheduled Meds:  acidophilus  1 capsule Oral Daily   amLODipine   10 mg Oral Daily   carvedilol   25 mg Oral BID WC   dofetilide   250 mcg Oral BID   escitalopram   10 mg Oral Daily   fentaNYL  (SUBLIMAZE ) injection  50 mcg Intravenous Once   fluticasone   1 spray Each Nare Daily   folic acid   1 mg Oral BID   furosemide   40 mg Intravenous Daily   hydrALAZINE   50 mg Oral Q8H   irbesartan   300 mg Oral Daily   pantoprazole   40 mg Oral BID   sodium chloride  flush  3 mL Intravenous Q12H   spironolactone   12.5 mg Oral Daily   warfarin  1.25 mg Oral ONCE-1600   Warfarin - Pharmacist Dosing Inpatient   Does not apply q1600   Continuous Infusions: acetaminophen  **OR** acetaminophen , hydrALAZINE , ipratropium-albuterol , loperamide  HCl  Colen Grimes, MD  [facility]

## 2024-09-17 NOTE — Progress Notes (Signed)
 Mobility Specialist Progress Note:    09/17/24 1524  Mobility  Activity Turned to back - supine (Ankle Pumps, Leg ext, Heel Slides)  Level of Assistance Standby assist, set-up cues, supervision of patient - no hands on  Assistive Device None  Range of Motion/Exercises Active  Activity Response Tolerated fair  Mobility Referral Yes  Mobility visit 1 Mobility  Mobility Specialist Start Time (ACUTE ONLY) 1524  Mobility Specialist Stop Time (ACUTE ONLY) 1530  Mobility Specialist Time Calculation (min) (ACUTE ONLY) 6 min   Received pt laying in bed agreeable to session. Pt able to move and perform movements decently w/ no issues. Pt w/ no c/o any symptoms. Left pt in bed w/ all needs met.   Venetia Keel Mobility Specialist Please Neurosurgeon or Rehab Office at [phone]

## 2024-09-17 NOTE — Progress Notes (Signed)
 Patient admitted with hypertensive urgency  /volume overload.  She is followed by Dr. Avram for Crohn's colitis.  She is on Remicade  but that is for rheumatoid arthritis.  She has not had a colonoscopy since 2013 (has previously declined), but disease was quiescent on her last exam with biopsies.  She was last seen in the office by Dr. Avram in June 2025.    Patient was nearing discharge when she mention to the Hospitalists that she has been having diarrhea for about 6 months.  She had not had any for the last day or 2 but then today she has had 4 episodes.  Hospitalists was looking for any recommendations.   Recommendations as follows:   --Check for C-diff.  --If negative, she can go home. --Instead of lactoferrin, we will order the more specific fecal calprotectin. --For her chronic diarrhea, put her Imodium  and daily fiber supplement such as Citrucel. --We will get her GI follow-up in the office in the next few weeks.

## 2024-09-17 NOTE — Progress Notes (Signed)
 PHARMACY - ANTICOAGULATION CONSULT NOTE  Pharmacy Consult for warfarin Indication: atrial fibrillation  Patient Measurements: Height: 5' 3 (160 cm) Weight: 106 kg (233 lb 11 oz) IBW/kg (Calculated) : 52.4 HEPARIN DW (KG): 79.2  Vital Signs: Temp: 98.2 F (36.8 C) (11/25 0722) Temp Source: Oral (11/25 0722) BP: 140/50 (11/25 0722) Pulse Rate: 51 (11/25 0722)  Labs: Recent Labs    09/15/24 0124 09/15/24 2148 09/16/24 0214 09/17/24 0222  HGB 10.0*  --  9.1*  --   HCT 30.7*  --  28.5*  --   PLT 195  --  188  --   LABPROT 26.8*  --  30.3* 27.7*  INR 2.3*  --  2.7* 2.4*  CREATININE 1.65* 1.67* 1.68* 1.68*    Estimated Creatinine Clearance: 31.6 mL/min (A) (by C-G formula based on SCr of 1.68 mg/dL (H)).   Medical History: Past Medical History:  Diagnosis Date   Allergy    Anxiety    Arthritis    Atrial fibrillation (HCC) 2008   Blood transfusion without reported diagnosis    CAD (coronary artery disease)    Crohn's colitis (HCC)    she reports ulcerative colitis beginning in her 72's, biopsies 2008 suggest Crohn's not UC   Depression    Diverticulosis    GERD (gastroesophageal reflux disease)    Hyperlipidemia    Hypertension    IBS (irritable bowel syndrome)    Keloid of skin    Obesity    OSA (obstructive sleep apnea)    Pancreatitis    Paroxysmal atrial fibrillation (HCC)    Pseudoaneurysm of right femoral artery    Renal oncocytoma    s/p right nephrectomy   Rheumatoid arthritis (HCC) 05/29/2017   Sleep apnea    UC (ulcerative colitis) (HCC)     Medications:  -Warfarin 1.25mg  PO every Sun, Tues, Thurs; 2.5mg  PO all other days   Assessment: 88 yoF presented with shortness of breath. Pharmacy consulted to dose warfarin for history of atrial fibrillation.  INR within goal.  Will resume PTA dose.  -Hgb 9.1, plts 188 -INR 2.3 > 2.7 > 2.4  Goal of Therapy:  INR 2-3 Monitor platelets by anticoagulation protocol: Yes   Plan:  -Warfarin 1.25mg  PO  x 1 tonight -INR/CBC daily  [employer], Pharm.D., BCPS Clinical Pharmacist Clinical phone for 09/17/2024 from 7:30-3:00 is [phone].  **Pharmacist phone directory can be found on [email or web address] listed under [facility].  09/17/2024 9:18 AM

## 2024-09-17 NOTE — TOC CM/SW Note (Addendum)
 Transition of Care [facility]) - Inpatient Brief Assessment   Patient Details  Name: [name] MRN: [id number] Date of Birth: [birth year]  [facility] of [facility]) CM/SW Contact:    Waddell Barnie Rama, RN Phone Number: 09/17/2024, 8:52 AM   Clinical Narrative: From home with spouse, has PCP and insurance on file, states has no HH services in place at this time but she had [facility] recently and would like them again has, cpap and 3 walkeders at home.  States family member (spouse) will transport them home at [employer] and family is support system, states gets medications from [facility] in [place].  Pta self ambulatory with walker.   Per PT eval rec HHPT, HHOT.  Patient  would like [facility].  NCM sent referral thru portal to [facility].  NCM asked MD for [facility] orders.   Transition of Care Asessment: Insurance and Status: Insurance coverage has been reviewed Patient has primary care physician: Yes Home environment has been reviewed: home with spouse Prior level of function:: ambulatory with walker Prior/Current Home Services: Current home services (cpap, walkers x 3) Social Drivers of Health Review: SDOH reviewed no interventions necessary Readmission risk has been reviewed: Yes Transition of care needs: transition of care needs identified, TOC will continue to follow

## 2024-09-17 NOTE — Progress Notes (Addendum)
 Occupational Therapy Treatment Patient Details Name: [name] MRN: [id number] DOB: June 20, 1946 Today's Date: 09/17/2024   History of present illness The pt is a 78 yo female presenting 11/20 with SOB x1 month. Work up revealed acute resp failure with hypoxia due to acute CHF exacerbation. PMH includes: afib, HTN, CAD, R nephrectomy, HFpEF.   OT comments  Pt making steady progress toward goals. Pt appears to desat with functional activity to 87 on RA. Able to maintain above 90 on 2L. Began education on strategies to reduce risk of falls, energy conservation, warning signs/symptoms of CHF exacerbation and managing O2 tubing during mobility. Daughter present for session and verbalized understanding. Continue to recommend HHOT after DC. Acute OT will follow to facilitate safe DC home.       If plan is discharge home, recommend the following:  A little help with walking and/or transfers;A little help with bathing/dressing/bathroom;Assistance with cooking/housework;Assist for transportation;Help with stairs or ramp for entrance   Equipment Recommendations  None recommended by OT    Recommendations for Other Services      Precautions / Restrictions Precautions Precautions: Fall Recall of Precautions/Restrictions: Intact Precaution/Restrictions Comments: 2-3L O2 Restrictions Weight Bearing Restrictions Per Provider Order: No       Mobility Bed Mobility Overal bed mobility: Independent             General bed mobility comments: sitting EOB upon arrival    Transfers Overall transfer level: Needs assistance Equipment used: Rollator (4 wheels) Transfers: Sit to/from Stand, Bed to chair/wheelchair/BSC Sit to Stand: Supervision     Step pivot transfers: Supervision     General transfer comment: cues for use of hands on bed surface to rise, no physical assist or need to stedy     Balance Overall balance assessment: Needs assistance Sitting-balance support: No upper  extremity supported, Feet supported Sitting balance-Leahy Scale: Good     Standing balance support: Bilateral upper extremity supported, During functional activity Standing balance-Leahy Scale: Fair Standing balance comment: able to complete pericare with intermittent support                           ADL either performed or assessed with clinical judgement   ADL Overall ADL's : Needs assistance/impaired         Upper Body Bathing: Set up   Lower Body Bathing: Minimal assistance;Sit to/from stand   Upper Body Dressing : Set up   Lower Body Dressing: Minimal assistance   Toilet Transfer: Contact guard assist;Ambulation;Regular Toilet;Rollator (4 wheels)   Toileting- Clothing Manipulation and Hygiene: Supervision/safety       Functional mobility during ADLs: Contact guard assist;Rollator (4 wheels) General ADL Comments: Educated on use of AE to assist with LB ADL; Pt would benefit form use of sock aid, reacher and long handled sponge - dtr present and plans to order; began education regarding signs/symptoms of CHF exacerbation; began education on energy conservation    Extremity/Trunk Assessment Upper Extremity Assessment Upper Extremity Assessment: Generalized weakness            Vision       Perception     Praxis     Communication Communication Communication: Impaired Factors Affecting Communication: Hearing impaired (bilateral hearing aides)   Cognition Arousal: Alert Behavior During Therapy: WFL for tasks assessed/performed Cognition: No apparent impairments  Following commands: Intact        Cueing   Cueing Techniques: Verbal cues  Exercises Exercises: Other exercises Other Exercises Other Exercises: 360 breath pattern    Shoulder Instructions       General Comments  SPO2 95; with activity 87; able to maintain above 90 on 2L; No DOE observed.    Pertinent Vitals/ Pain       Pain  Assessment Pain Assessment: No/denies pain  Home Living                                          Prior Functioning/Environment              Frequency  Min 2X/week        Progress Toward Goals  OT Goals(current goals can now be found in the care plan section)  Progress towards OT goals: Progressing toward goals  Acute Rehab OT Goals Patient Stated Goal: get better OT Goal Formulation: With patient/family Time For Goal Achievement: 09/29/24 Potential to Achieve Goals: Good ADL Goals Pt Will Perform Grooming: with modified independence;standing Pt Will Perform Lower Body Bathing: with modified independence;with adaptive equipment;sitting/lateral leans;sit to/from stand Pt Will Perform Lower Body Dressing: with modified independence;with adaptive equipment;sitting/lateral leans;sit to/from stand Pt Will Transfer to Toilet: with modified independence;ambulating;regular height toilet;grab bars Pt Will Perform Toileting - Clothing Manipulation and hygiene: with modified independence;sitting/lateral leans;sit to/from stand Pt/caregiver will Perform Home Exercise Program: Increased ROM;Increased strength;Both right and left upper extremity;Independently;With written HEP provided Additional ADL Goal #1: Pt will recall and utilize 3 energy conservation strategies during ADLs and functional mobility.  Plan      Co-evaluation                 AM-PAC OT 6 Clicks Daily Activity     Outcome Measure   Help from another person eating meals?: None Help from another person taking care of personal grooming?: A Little Help from another person toileting, which includes using toliet, bedpan, or urinal?: A Little Help from another person bathing (including washing, rinsing, drying)?: A Little Help from another person to put on and taking off regular upper body clothing?: A Little Help from another person to put on and taking off regular lower body clothing?: A  Little 6 Click Score: 19    End of Session Equipment Utilized During Treatment: Gait belt;Rollator (4 wheels)  OT Visit Diagnosis: Unsteadiness on feet (R26.81);Muscle weakness (generalized) (M62.81)   Activity Tolerance Patient tolerated treatment well   Patient Left in bed;with call bell/phone within reach;Other (comment);with family/visitor present (sitting EOB)   Nurse Communication Mobility status;Other (comment) (recommend incentive spirometer adn CHF education)        Time: 8859-8782 OT Time Calculation (min): 37 min  Charges: OT General Charges $OT Visit: 1 Visit OT Treatments $Self Care/Home Management : 23-37 mins  Kreg Sink, OT/L   Acute OT Clinical Specialist Acute Rehabilitation Services Pager [phone] Office [phone]   [facility] 09/17/2024, 12:27 PM

## 2024-09-18 ENCOUNTER — Encounter: Payer: Self-pay | Admitting: Family Medicine

## 2024-09-18 ENCOUNTER — Encounter: Payer: Self-pay | Admitting: Cardiology

## 2024-09-18 ENCOUNTER — Other Ambulatory Visit (HOSPITAL_COMMUNITY): Payer: Self-pay

## 2024-09-18 DIAGNOSIS — N1831 Chronic kidney disease, stage 3a: Secondary | ICD-10-CM

## 2024-09-18 DIAGNOSIS — K50119 Crohn's disease of large intestine with unspecified complications: Secondary | ICD-10-CM

## 2024-09-18 DIAGNOSIS — D649 Anemia, unspecified: Secondary | ICD-10-CM

## 2024-09-18 DIAGNOSIS — F341 Dysthymic disorder: Secondary | ICD-10-CM

## 2024-09-18 DIAGNOSIS — K219 Gastro-esophageal reflux disease without esophagitis: Secondary | ICD-10-CM

## 2024-09-18 LAB — GASTROINTESTINAL PANEL BY PCR, STOOL (REPLACES STOOL CULTURE)

## 2024-09-18 LAB — COMPREHENSIVE METABOLIC PANEL WITH GFR
ALT: 13 U/L (ref 0–44)
AST: 16 U/L (ref 15–41)
Albumin: 2.3 g/dL — ABNORMAL LOW (ref 3.5–5.0)
Alkaline Phosphatase: 48 U/L (ref 38–126)
Anion gap: 9 (ref 5–15)
BUN: 35 mg/dL — ABNORMAL HIGH (ref 8–23)
CO2: 26 mmol/L (ref 22–32)
Calcium: 8.3 mg/dL — ABNORMAL LOW (ref 8.9–10.3)
Chloride: 100 mmol/L (ref 98–111)
Creatinine, Ser: 1.7 mg/dL — ABNORMAL HIGH (ref 0.44–1.00)
GFR, Estimated: 30 mL/min — ABNORMAL LOW (ref >60)
Glucose, Bld: 95 mg/dL (ref 70–99)
Potassium: 4 mmol/L (ref 3.5–5.1)
Sodium: 135 mmol/L (ref 135–145)
Total Bilirubin: 0.7 mg/dL (ref 0.0–1.2)
Total Protein: 5.5 g/dL — ABNORMAL LOW (ref 6.5–8.1)

## 2024-09-18 LAB — PROTIME-INR
INR: 2.3 — ABNORMAL HIGH (ref 0.8–1.2)
Prothrombin Time: 26.1 s — ABNORMAL HIGH (ref 11.4–15.2)

## 2024-09-18 MED ORDER — TORSEMIDE 20 MG PO TABS: 40.0000 mg | ORAL_TABLET | Freq: Every day | ORAL | Status: DC

## 2024-09-18 MED ORDER — TORSEMIDE 20 MG PO TABS
40.0000 mg | ORAL_TABLET | Freq: Every day | ORAL | 0 refills | Status: DC
  Filled 2024-09-18: qty 60, 30d supply, fill #0

## 2024-09-18 MED ORDER — DOFETILIDE 125 MCG PO CAPS: 250.0000 ug | ORAL_CAPSULE | Freq: Two times a day (BID) | ORAL | Status: DC

## 2024-09-18 MED ORDER — WARFARIN 1.25 MG HALF TABLET: 1.2500 mg | ORAL_TABLET | ORAL | Status: DC

## 2024-09-18 MED ORDER — CARVEDILOL 25 MG PO TABS
25.0000 mg | ORAL_TABLET | Freq: Two times a day (BID) | ORAL | 0 refills | Status: DC
  Filled 2024-09-18: qty 60, 30d supply, fill #0

## 2024-09-18 MED ORDER — WARFARIN SODIUM 2.5 MG PO TABS: 2.5000 mg | ORAL_TABLET | ORAL | Status: DC

## 2024-09-18 NOTE — Discharge Summary (Addendum)
 Physician Discharge Summary   Patient: [name] MRN: [id number] DOB: 1946/04/10  Admit date:     09/12/2024  Discharge date: 09/18/24  Discharge Physician: [name]   PCP: Domenica Harlene LABOR, MD   Recommendations at discharge:    Carvedilol  was increased to 25 mg po bid and added torsemide  for diuresis. Continue valsartan  and spironolactone . Hold on clonidine .  Decreased dofetilide  to 250 mg po bid.  Added supplemental 02 per [place] to keep 02 saturation 92% or greater.  Follow up renal function and electrolytes as outpatient in 7 days  Follow up with home health services.  Follow up with Dr Domenica in 7 to 10 days. Follow up with Cardiology and GI as outpatient   Discharge Diagnoses: Principal Problem:   Acute on chronic diastolic CHF (congestive heart failure) (HCC) Active Problems:   Essential hypertension   CAD (coronary artery disease)   Paroxysmal atrial fibrillation (HCC)   Chronic kidney disease, stage 3a (HCC)   Rheumatoid arthritis (HCC)   Crohn's disease (HCC)   Anemia   GERD (gastroesophageal reflux disease)   DEPRESSION/ANXIETY   Obesity, class 3 (HCC)  Resolved Problems:   * No resolved hospital problems. [name] Course: [name] was admitted to the hospital with the working diagnosis of heart failure exacerbation   78 yo female with the past medical history of hypertension, hyperlipidemia, coronary artery disease, paroxysmal atrial fibrillation, rheumatoid arthritis, and crohn's disease who presented with dyspnea.  Reported several days of worsening dyspnea and uncontrolled hypertension for several days prior to admission. Her blood pressure was persistent elevated despite outpatient increased of blood pressure regimen.  On her initial physical examination her blood pressure was 159/59, HR 66, RR 23 and 02 saturation 96% Lungs with decreased breath sounds, with no wheezing, heart with S1 and S2 present and regular with no gallops,  abdomen with no distention, positive lower extremity edema.   Na 141, K 4.0 Cl 108 bicarbonate 22 glucose 101, bun 17 cr 1,20  BNP 1,773  High sensitive troponin 26  Wbc 6,1 hgb 11.0 plt 187   Chest radiograph with mild cardiomegaly, bilateral hilar vascular congestion and small bilateral pleural effusions   EKG 69 bpm, normal axis, qtc 460, normal intervals, sinus rhythm with no significant ST segment or T wave changes.   CT chest with no evidence of aortic dissection or aneurysm.  Small to moderate sized bilateral pleural effusions with compressive atelectasis in the lower lobes.  Mediastinal lymphadenopathy  Small amount of air in the bladder.  Colonic diverticulosis   Patient was placed on IV furosemide  for diuresis.  Echocardiogram with preserved LV systolic function.   Assessment and Plan: * Acute on chronic diastolic CHF (congestive heart failure) (HCC) Echocardiogram with preserved LV systolic function with EF 65 to 70%, mild LVH, RV systolic function preserved, LA with moderate dilatation, no significant valvular disease.   Patient was placed on IV furosemide  for diuresis.  Negative fluid balance was achieved, -5,901 ml, with significant improvement in her symptoms.   Plan to continue medical therapy with carvedilol , valsartan  and spironolactone .  Loop diuretic for diuresis.  Due to body habitus and immunosuppression will hold on SGLT 2 inh for now.   Acute hypoxemic respiratory failure, positive tachypnea and increased work of breathing on admission. Acute cardiogenic pulmonary edema, with bilateral pleural effusions.  Patient tolerated well diuresis, her 02 saturation is 91% on room air at rest and desaturates to 87% on exertion.  Patient will have  supplemental 02 at home.   Essential hypertension Continue blood pressure control with valsartan  and carvedilol  Diuresis with spironolactone  and loop diuretic   CAD (coronary artery disease) No chest pain, no acute  coronary syndrome  High sensitive troponin elevation due to heart failure decompensation   Paroxysmal atrial fibrillation (HCC) Patient on sinus rhythm, plan to continue rate control with carvedilol .  Continue with dofetilide .  Anticoagulation with warfarin  Her discharge INR is 2.3   Chronic kidney disease, stage 3a (HCC) AKI, hyponatremia.   Serum cr has been stable over the last 3 days, at 1,68 and 1,70  Renal function at the time of discharge with serum cr at 1,7 with K at 4,0 and serum bicarbonate at 26  Na 135   Plan to continue diuresis and follow up renal function and electrolytes as outpatient.   Rheumatoid arthritis (HCC) No acute flare  Plan to continue outpatient management with infliximab  and methotrexate   Crohn's disease (HCC) Positive chronic diarrhea, but no signs of acute flare.  Plan to continue outpatient management.  Patient on infliximab  and methotrexate   Stool panel tested positive for salmonella, considering patient's clinical picture, of overall improving, will plan to hold on antibiotic therapy. She will follow up with GI as outpatient, (discussed with GI).   Anemia Anemia of chronic disease.  Follow up hgb at 9,1 with plt at 188  Plan to follow up as outpatient   GERD (gastroesophageal reflux disease) Continue proton pump inhibitor   DEPRESSION/ANXIETY Continue with escitalopram .   Obesity, class 3 (HCC) Calculated BMI is 40.8          Consultants: cardiology and gastroenterology  Procedures performed: none   Disposition: Home Diet recommendation:  Cardiac diet DISCHARGE MEDICATION: Allergies as of 09/18/2024       Reactions   Hydralazine  Diarrhea, Nausea Only   Anorexia, upset stomach, burning pain in abdomin   Cefdinir     Pt cannot take; may interfere with AFib.   Codeine    Heart beats fast   Fosamax [alendronate Sodium] Other (See Comments)   GI upset   Gluten Meal    Guaifenesin Er Nausea And Vomiting, Other (See  Comments)   Headaches; can tolerate liquid.   Latex Other (See Comments)   Breathing problems   Percocet [oxycodone -acetaminophen ]    Itching & swelling in jaw Pt said she can take tylenol    Psyllium Other (See Comments)        Medication List     STOP taking these medications    amLODipine  10 MG tablet Commonly known as: NORVASC    cloNIDine  0.1 MG tablet Commonly known as: CATAPRES    doxazosin  2 MG tablet Commonly known as: CARDURA    isosorbide  mononitrate 30 MG 24 hr tablet Commonly known as: IMDUR    loperamide  2 MG tablet Commonly known as: IMODIUM  A-D       TAKE these medications    carvedilol  25 MG tablet Commonly known as: COREG  Take 1 tablet (25 mg total) by mouth 2 (two) times daily with a meal. What changed:  medication strength how much to take when to take this   clobetasol ointment 0.05 % Commonly known as: TEMOVATE Apply 1 Application topically 2 (two) times daily. What changed:  when to take this reasons to take this   dextrose 5 % SOLN 50 mL with methotrexate 25 MG/ML (PF) SOLN 40 mg/m2 Inject 40 mg/m2 into the vein once a week. Every wednesday   dofetilide  125 MCG capsule Commonly known as: TIKOSYN   Take 2 capsules (250 mcg total) by mouth 2 (two) times daily. What changed: See the new instructions.   escitalopram  10 MG tablet Commonly known as: LEXAPRO  Take 1 tablet (10 mg total) by mouth daily.   esomeprazole  40 MG capsule Commonly known as: NexIUM  Take 1 capsule (40 mg total) by mouth daily. Take in the morning before breakfast. What changed:  when to take this additional instructions   folic acid  1 MG tablet Commonly known as: FOLVITE  Take 2 mg by mouth daily. What changed:  how much to take when to take this   hydrocortisone  25 MG suppository Commonly known as: Anusol -HC Place 1 suppository (25 mg total) rectally 2 (two) times daily as needed for hemorrhoids or anal itching.   inFLIXimab  10 mg/kg in sodium chloride   0.9 % Inject 10 mg/kg into the vein every 6 (six) weeks.   methotrexate (PF) 50 MG/2ML injection Inject 2 mLs into the muscle once a week.   multivitamin capsule Take 1 capsule by mouth daily. 1 daily   nitroGLYCERIN  0.4 MG SL tablet Commonly known as: NITROSTAT  Place 1 tablet (0.4 mg total) under the tongue every 5 (five) minutes as needed for chest pain. Do not exceed 3 tabs in 15 minutes   [facility] Caps Take 1 capsule by mouth daily. 1 per day   spironolactone  25 MG tablet Commonly known as: ALDACTONE  Take 0.5 tablets (12.5 mg total) by mouth daily.   Torsemide  40 MG Tabs Take 40 mg by mouth daily. Start taking on: September 19, 2024   TUBERCULIN SYR 1CC/27GX1/2 27G X 1/2 1 ML Misc   valsartan  160 MG tablet Commonly known as: Diovan  TAKE 1 AND 1/2 TABLETS ONCE DAILY   warfarin 2.5 MG tablet Commonly known as: COUMADIN  Take as directed. If you are unsure how to take this medication, talk to your nurse or doctor. Original instructions: TAKE 1/2 TABLET TO 1 TABLET BY MOUTH DAILY AS DIRECTED BY COUMADIN  CLINIC What changed: See the new instructions.               Durable Medical Equipment  (From admission, onward)           Start     Ordered   09/18/24 1153  For home use only DME oxygen  Once       Question Answer Comment  Length of Need 12 Months   Mode or (Route) Nasal cannula   Liters per Minute 2   Frequency Continuous (stationary and portable oxygen unit needed)   Oxygen conserving device Yes   Oxygen delivery system: Gas      09/18/24 1152            Contact information for follow-up providers     Domenica Harlene LABOR, MD Follow up.   Specialty: Family Medicine Contact information: [address] [place] [address] [phone]         Antonio Cyndee Jamee JONELLE, DO Follow up on 09/24/2024.   Specialty: Family Medicine Why: 11:40 for hospital up Contact information: [address] [place]  [place] [address] [phone]              Contact information for after-discharge care     Home Medical Care     [facility]) .   Service: Home Health Services Why: Agency will call you to set up apt times Contact information: [address] [place]  [phone]  Discharge Exam: Filed Weights   09/16/24 0500 09/17/24 0500 09/18/24 0319  Weight: 108 kg 106 kg 104.7 kg   BP (!) 121/50 (BP Location: Left Arm)   Pulse (!) 57   Temp 97.9 F (36.6 C) (Oral)   Resp 12   Ht 5' 3 (1.6 m)   Wt 104.7 kg   SpO2 91%   BMI 40.89 kg/m   Patient is feeling well, has no chest pain or dyspnea, no PND, or orthopnea, diarrhea is stable and not worsening, no new abdominal pain, no nausea or vomiting.   Neurology awake and alert ENT with mild pallor with no icterus Cardiovascular with S1 and S2 present and regular with no gallops, rubs or murmurs  Respiratory with no rales or wheezing, no rhonchi, poor inspiratory effort Abdomen soft and not distended, mild tender to deep palpation, (chronic), no rebound or guarding  Trace non pitting lower extremity edema.    Condition at discharge: stable  The results of significant diagnostics from this hospitalization (including imaging, microbiology, ancillary and laboratory) are listed below for reference.   Imaging Studies: ECHOCARDIOGRAM COMPLETE Result Date: 09/13/2024    ECHOCARDIOGRAM REPORT   Patient Name:   SHANAYE RIEF [facility] Date of Exam: 09/13/2024 Medical Rec #:  [id number]        Height:       63.0 in Accession #:    [id number]       Weight:       245.0 lb Date of Birth:  [birth year]       BSA:          2.108 m Patient Age:    78 years         BP:           157/48 mmHg Patient Gender: F                HR:           65 bpm. Exam Location:  Inpatient Procedure: 2D Echo, Color Doppler and Cardiac Doppler (Both Spectral and Color            Flow Doppler were utilized  during procedure). Indications:    CHF I50.31  History:        Patient has prior history of Echocardiogram examinations, most                 recent 09/12/2023. CHF, Arrythmias:Atrial Fibrillation; Risk                 Factors:Hypertension.  Sonographer:    Tinnie Gosling RDCS Referring Phys: [phone] RONDELL A SMITH IMPRESSIONS  1. Left ventricular ejection fraction, by estimation, is 65 to 70%. The left ventricle has normal function. The left ventricle has no regional wall motion abnormalities. There is mild left ventricular hypertrophy. Left ventricular diastolic parameters were normal.  2. Right ventricular systolic function is normal. The right ventricular size is normal.  3. Left atrial size was moderately dilated.  4. The mitral valve is abnormal. Trivial mitral valve regurgitation. No evidence of mitral stenosis.  5. The aortic valve is tricuspid. There is moderate calcification of the aortic valve. Aortic valve regurgitation is not visualized. Aortic valve sclerosis is present, with no evidence of aortic valve stenosis.  6. The inferior vena cava is normal in size with greater than 50% respiratory variability, suggesting right atrial pressure of 3 mmHg. FINDINGS  Left Ventricle: Left ventricular ejection fraction, by estimation, is 65 to 70%. The left ventricle has normal function.  The left ventricle has no regional wall motion abnormalities. Strain was performed and the global longitudinal strain is indeterminate. The left ventricular internal cavity size was normal in size. There is mild left ventricular hypertrophy. Left ventricular diastolic parameters were normal. Right Ventricle: The right ventricular size is normal. No increase in right ventricular wall thickness. Right ventricular systolic function is normal. Left Atrium: Left atrial size was moderately dilated. Right Atrium: Right atrial size was normal in size. Pericardium: There is no evidence of pericardial effusion. Mitral Valve: The mitral valve  is abnormal. There is mild thickening of the mitral valve leaflet(s). Trivial mitral valve regurgitation. No evidence of mitral valve stenosis. Tricuspid Valve: The tricuspid valve is normal in structure. Tricuspid valve regurgitation is not demonstrated. No evidence of tricuspid stenosis. Aortic Valve: The aortic valve is tricuspid. There is moderate calcification of the aortic valve. Aortic valve regurgitation is not visualized. Aortic valve sclerosis is present, with no evidence of aortic valve stenosis. Pulmonic Valve: The pulmonic valve was normal in structure. Pulmonic valve regurgitation is not visualized. No evidence of pulmonic stenosis. Aorta: The aortic root is normal in size and structure. Venous: The inferior vena cava is normal in size with greater than 50% respiratory variability, suggesting right atrial pressure of 3 mmHg. IAS/Shunts: No atrial level shunt detected by color flow Doppler. Additional Comments: 3D was performed not requiring image post processing on an independent workstation and was indeterminate.  LEFT VENTRICLE PLAX 2D LVIDd:         5.10 cm   Diastology LVIDs:         2.90 cm   LV e' medial:    7.72 cm/s LV PW:         1.00 cm   LV E/e' medial:  16.7 LV IVS:        1.20 cm   LV e' lateral:   7.62 cm/s LVOT diam:     2.30 cm   LV E/e' lateral: 16.9 LV SV:         182 LV SV Index:   86 LVOT Area:     4.15 cm LV IVRT:       58 msec  RIGHT VENTRICLE             IVC RV S prime:     11.90 cm/s  IVC diam: 2.20 cm TAPSE (M-mode): 1.9 cm                             PULMONARY VEINS                             Diastolic Velocity: 37.20 cm/s                             S/D Velocity:       1.10                             Systolic Velocity:  41.70 cm/s LEFT ATRIUM              Index        RIGHT ATRIUM           Index LA diam:        5.20 cm  2.47 cm/m   RA Area:     18.20 cm  LA Vol (A2C):   85.5 ml  40.56 ml/m  RA Volume:   54.10 ml  25.67 ml/m LA Vol (A4C):   121.0 ml 57.41 ml/m LA  Biplane Vol: 107.0 ml 50.76 ml/m  AORTIC VALVE LVOT Vmax:   166.00 cm/s LVOT Vmean:  134.000 cm/s LVOT VTI:    0.438 m  AORTA Ao Root diam: 2.70 cm MITRAL VALVE MV Area (PHT): 2.28 cm     SHUNTS MV Decel Time: 333 msec     Systemic VTI:  0.44 m MV E velocity: 129.00 cm/s  Systemic Diam: 2.30 cm MV A velocity: 85.50 cm/s MV E/A ratio:  1.51 Maude Emmer MD Electronically signed by Maude Emmer MD Signature Date/Time: 09/13/2024/3:34:33 PM    Final    CT Angio Chest/Abd/Pel for Dissection W and/or Wo Contrast Result Date: 09/12/2024 CLINICAL DATA:  Acute aortic syndrome suspected. Shortness of breath. EXAM: CT ANGIOGRAPHY CHEST, ABDOMEN AND PELVIS TECHNIQUE: Noncontrast chest CT was performed. Multidetector CT imaging through the chest, abdomen and pelvis was performed using the standard protocol during bolus administration of intravenous contrast. Multiplanar reconstructed images and MIPs were obtained and reviewed to evaluate the vascular anatomy. RADIATION DOSE REDUCTION: This exam was performed according to the departmental dose-optimization program which includes automated exposure control, adjustment of the mA and/or kV according to patient size and/or use of iterative reconstruction technique. CONTRAST:  OMNIPAQUE  IOHEXOL  350 MG/ML SOLN COMPARISON:  CT angiogram chest abdomen and pelvis 09/06/2023. FINDINGS: CTA CHEST FINDINGS Cardiovascular: Preferential opacification of the thoracic aorta. No evidence of thoracic aortic aneurysm or dissection. The heart is mildly enlarged. No pericardial effusion. The left vertebral artery arises from the aortic arch. Mediastinum/Nodes: There are nonenlarged and enlarged paratracheal lymph nodes which have increased in size and number measuring up to 14 mm. Nonenlarged subcarinal, right hilar, and prevascular lymph nodes have increased in number when compared to the prior study. Visualized thyroid  gland and esophagus are within normal limits. Lungs/Pleura: There  are small to moderate-sized bilateral pleural effusions. There is compressive atelectasis in the bilateral lower lobes. The lungs are otherwise clear. There are few calcified granulomas in right lung. Musculoskeletal: No chest wall abnormality. No acute or significant osseous findings. Review of the MIP images confirms the above findings. CTA ABDOMEN AND PELVIS FINDINGS VASCULAR Aorta: Normal caliber aorta without aneurysm, dissection, vasculitis or significant stenosis. There are atherosclerotic calcifications of the aorta. Celiac: Patent without evidence of aneurysm, dissection, vasculitis or significant stenosis. SMA: Patent without evidence of aneurysm, dissection, vasculitis or significant stenosis. Renals: Right nephrectomy changes present. Left renal artery within normal limits. IMA: Patent without evidence of aneurysm, dissection, vasculitis or significant stenosis. Inflow: Patent without evidence of aneurysm, dissection, vasculitis or significant stenosis. Calcified atherosclerotic disease present. Veins: No obvious venous abnormality within the limitations of this arterial phase study. Review of the MIP images confirms the above findings. NON-VASCULAR Hepatobiliary: No focal liver abnormality is seen. Status post cholecystectomy. No biliary dilatation. Pancreas: Unremarkable. No pancreatic ductal dilatation or surrounding inflammatory changes. Spleen: Normal in size without focal abnormality. 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 Bilateral adrenal glands and left kidney are within normal limits. The bladder is decompressed. There is a small amount of air in the bladder. Stomach/Bowel: Stomach is within normal limits. Appendix appears normal. No evidence of bowel wall thickening, distention, or inflammatory changes. There scattered colonic diverticula. Lymphatic: No enlarged lymph nodes are seen. Reproductive: Uterus and bilateral adnexa are unremarkable. Other: No abdominal wall  hernia or abnormality.  No abdominopelvic ascites. Musculoskeletal: No acute or significant osseous findings. Review of the MIP images confirms the above findings. IMPRESSION: 1. No evidence for aortic dissection or aneurysm. 2. Small to moderate-sized bilateral pleural effusions with compressive atelectasis in the lower lobes. 3. Mild cardiomegaly. 4. Mediastinal lymphadenopathy, increased from prior. Findings may be reactive, but metastatic disease can not be excluded. 5. Small amount of air in the bladder. 6. Colonic diverticulosis. 7. Aortic atherosclerosis. Aortic Atherosclerosis (ICD10-I70.0). Electronically Signed   By: Greig Pique M.D.   On: 09/12/2024 18:09   DG Chest 2 View Result Date: 09/12/2024 EXAM: 2 VIEW(S) XRAY OF THE CHEST 09/12/2024 03:30:31 PM COMPARISON: 09/10/2023 CLINICAL HISTORY: shortness of breath FINDINGS: LUNGS AND PLEURA: Minimal interstitial densities are noted bilaterally suggesting possible pulmonary edema. Small pleural effusions are noted. Minimal left basilar atelectasis. No pneumothorax. HEART AND MEDIASTINUM: Stable cardiomegaly. BONES AND SOFT TISSUES: No acute osseous abnormality. IMPRESSION: 1. Stable cardiomegaly. 2. Minimal interstitial opacities bilaterally, compatible with mild pulmonary edema. 3. Small bilateral pleural effusions. 4. Minimal left basilar atelectasis. Electronically signed by: Lynwood Seip MD 09/12/2024 03:42 PM EST RP Workstation: [id number]    Microbiology: Results for orders placed or performed during the hospital encounter of 09/12/24  C Difficile Quick Screen (NO PCR Reflex)     Status: None   Collection Time: 09/17/24  2:41 PM   Specimen: STOOL  Result Value Ref Range Status   C Diff antigen NEGATIVE NEGATIVE Final   C Diff toxin NEGATIVE NEGATIVE Final   C Diff interpretation No C. difficile detected.  Final    Comment: Performed at [facility], [address], [place], [place] [address]  Gastrointestinal Panel by PCR , Stool      Status: Abnormal   Collection Time: 09/17/24  2:42 PM   Specimen: STOOL  Result Value Ref Range Status   Campylobacter species NOT DETECTED NOT DETECTED Final   Plesimonas shigelloides NOT DETECTED NOT DETECTED Final   Salmonella species DETECTED (A) NOT DETECTED Final    Comment: RESULT CALLED TO, READ BACK BY AND VERIFIED WITH: MIKE FUTRELL 09/18/24 0942 MW    Yersinia enterocolitica NOT DETECTED NOT DETECTED Final   Vibrio species NOT DETECTED NOT DETECTED Final   Vibrio cholerae NOT DETECTED NOT DETECTED Final   Enteroaggregative E coli (EAEC) NOT DETECTED NOT DETECTED Final   Enteropathogenic E coli (EPEC) NOT DETECTED NOT DETECTED Final   Enterotoxigenic E coli (ETEC) NOT DETECTED NOT DETECTED Final   Shiga like toxin producing E coli (STEC) NOT DETECTED NOT DETECTED Final   Shigella/Enteroinvasive E coli (EIEC) NOT DETECTED NOT DETECTED Final   Cryptosporidium NOT DETECTED NOT DETECTED Final   Cyclospora cayetanensis NOT DETECTED NOT DETECTED Final   Entamoeba histolytica NOT DETECTED NOT DETECTED Final   Giardia lamblia NOT DETECTED NOT DETECTED Final   Adenovirus F40/41 NOT DETECTED NOT DETECTED Final   Astrovirus NOT DETECTED NOT DETECTED Final   Norovirus GI/GII NOT DETECTED NOT DETECTED Final   Rotavirus A NOT DETECTED NOT DETECTED Final   Sapovirus (I, II, IV, and V) NOT DETECTED NOT DETECTED Final    Comment: Performed at [facility], [address] [place], [place] [address]   *Note: Due to a large number of results and/or encounters for the requested time period, some results have not been displayed. A complete set of results can be found in Results Review.    Labs: CBC: Recent Labs  Lab 09/12/24 1437 09/12/24 1745 09/13/24 0522 09/14/24 0734 09/15/24  0124 09/16/24 0214  WBC 6.1  --  6.2 7.7 8.7 9.4  HGB 11.0* 10.9* 9.7* 10.1* 10.0* 9.1*  HCT 34.0* 32.0* 30.5* 30.7* 30.7* 28.5*  MCV 100.6*  --  101.0* 100.3* 100.7* 101.4*  PLT 187  --  180  191 195 188   Basic Metabolic Panel: Recent Labs  Lab 09/13/24 1646 09/13/24 2353 09/14/24 0734 09/15/24 0124 09/15/24 2148 09/16/24 0214 09/17/24 0222 09/18/24 0222  NA 140  --  138 137 131* 134* 135 135  K 3.7   < > 4.1 3.7 4.0 3.8 4.1 4.0  CL 105  --  104 102 99 99 100 100  CO2 23  --  23 26 25 25 24 26   GLUCOSE 100*  --  96 86 102* 89 82 95  BUN 19  --  18 22 27* 29* 36* 35*  CREATININE 1.60*  --  1.49* 1.65* 1.67* 1.68* 1.68* 1.70*  CALCIUM  8.2*  --  8.3* 8.0* 7.8* 7.9* 8.0* 8.3*  MG 2.0  --  2.5* 2.1 2.0 2.0  --   --    < > = values in this interval not displayed.   Liver Function Tests: Recent Labs  Lab 09/12/24 1437 09/17/24 0222 09/18/24 0222  AST 31 17 16   ALT 17 14 13   ALKPHOS 76 49 48  BILITOT 0.4 0.7 0.7  PROT 7.0 5.6* 5.5*  ALBUMIN 3.6 2.3* 2.3*   CBG: No results for input(s): GLUCAP in the last 168 hours.  Discharge time spent: greater than 30 minutes.  Signed: Elidia Toribio Furnace, MD [facility] 09/18/2024

## 2024-09-18 NOTE — Assessment & Plan Note (Signed)
 AKI, hyponatremia.   Serum cr has been stable over the last 3 days, at 1,68 and 1,70  Renal function at the time of discharge with serum cr at 1,7 with K at 4,0 and serum bicarbonate at 26  Na 135   Plan to continue diuresis and follow up renal function and electrolytes as outpatient.

## 2024-09-18 NOTE — Progress Notes (Signed)
 Physical Therapy Treatment Patient Details Name: [name] MRN: [id number] DOB: 03-03-1946 Today's Date: 09/18/2024   History of Present Illness The pt is a 78 yo female presenting 11/20 with SOB x1 month. Work up revealed acute resp failure with hypoxia due to acute CHF exacerbation. PMH includes: afib, HTN, CAD, R nephrectomy, HFpEF.    PT Comments  The pt was agreeable to session, presenting on RA upon arrival of PT with pt reporting she had just ambulated to and from bathroom on RA, reports lightheaded and with SpO2 at 85%. With seated rest, SpO2 recovered to 95%, pt then attempted ambulation on RA again with low of 86% after 64ft. Pt then attempted on 2L with SpO2 >94%. Pt educated on progressive walking program as well as LE and core exercises. Expressed good understanding. Recommendations remain appropriate.    If plan is discharge home, recommend the following: A little help with walking and/or transfers;A lot of help with bathing/dressing/bathroom;Help with stairs or ramp for entrance   Can travel by private vehicle        Equipment Recommendations  Other (comment) (oxygen)    Recommendations for Other Services       Precautions / Restrictions Precautions Precautions: Fall Recall of Precautions/Restrictions: Intact Precaution/Restrictions Comments: 2-3L O2 Restrictions Weight Bearing Restrictions Per Provider Order: No     Mobility  Bed Mobility Overal bed mobility: Independent             General bed mobility comments: sitting EOB upon arrival    Transfers Overall transfer level: Needs assistance Equipment used: Rollator (4 wheels) Transfers: Sit to/from Stand Sit to Stand: Supervision           General transfer comment: cues for use of hands on bed surface to rise, no physical assist or need to stedy    Ambulation/Gait Ambulation/Gait assistance: Supervision Gait Distance (Feet): 80 Feet (135ft) Assistive device: Rollator (4 wheels) Gait  Pattern/deviations: Step-through pattern, Decreased stride length Gait velocity: 0.5 m/s     General Gait Details: pt with slow, guarded gait with reports of lightheadedness after ~75 ft. SpO2 to 86% on RA,   Stairs             Wheelchair Mobility     Tilt Bed    Modified Rankin (Stroke Patients Only)       Balance Overall balance assessment: Needs assistance Sitting-balance support: No upper extremity supported, Feet supported Sitting balance-Leahy Scale: Good     Standing balance support: Bilateral upper extremity supported, During functional activity Standing balance-Leahy Scale: Fair Standing balance comment: can static stand without UE support, prefers BUE support for gait                            Communication Communication Communication: Impaired Factors Affecting Communication: Hearing impaired (bilateral hearing aides)  Cognition Arousal: Alert Behavior During Therapy: WFL for tasks assessed/performed   PT - Cognitive impairments: No apparent impairments                       PT - Cognition Comments: WFL, limited by [facility] Following commands: Intact      Cueing Cueing Techniques: Verbal cues  Exercises Other Exercises Other Exercises: sit-stand from chair x5    General Comments General comments (skin integrity, edema, etc.): Pt on RA upon arrival reports just walking back from bathroom and SpO2 to 85%, recovered to 95% on RA at rest. after 16ft ambulation, pt  desat to 86%, placed on 2L and maintained >90% for ambulation back to room.      Pertinent Vitals/Pain Pain Assessment Pain Assessment: No/denies pain    Home Living                          Prior Function            PT Goals (current goals can now be found in the care plan section) Acute Rehab PT Goals Patient Stated Goal: to return home PT Goal Formulation: With patient Time For Goal Achievement: 09/28/24 Potential to Achieve Goals: Good Progress  towards PT goals: Progressing toward goals    Frequency    Min 2X/week      PT Plan      Co-evaluation              AM-PAC PT 6 Clicks Mobility   Outcome Measure  Help needed turning from your back to your side while in a flat bed without using bedrails?: None Help needed moving from lying on your back to sitting on the side of a flat bed without using bedrails?: None Help needed moving to and from a bed to a chair (including a wheelchair)?: A Little Help needed standing up from a chair using your arms (e.g., wheelchair or bedside chair)?: A Little Help needed to walk in hospital room?: A Little Help needed climbing 3-5 steps with a railing? : A Little 6 Click Score: 20    End of Session Equipment Utilized During Treatment: Gait belt;Oxygen Activity Tolerance: Patient tolerated treatment well Patient left: in bed;with [name] bell/phone within reach;with family/visitor present;with nursing/sitter in room (sitting EOB) Nurse Communication: Mobility status;Other (comment) (O2 needs) PT Visit Diagnosis: Unsteadiness on feet (R26.81);Other abnormalities of gait and mobility (R26.89);Muscle weakness (generalized) (M62.81)     Time: 9084-9044 PT Time Calculation (min) (ACUTE ONLY): 40 min  Charges:    $Gait Training: 8-22 mins $Therapeutic Exercise: 23-37 mins PT General Charges $$ ACUTE PT VISIT: 1 Visit                     [name], PT, DPT   Acute Rehabilitation Department Office [phone] Secure Chat Communication Preferred   [name] 09/18/2024, 12:10 PM

## 2024-09-18 NOTE — Assessment & Plan Note (Addendum)
 Patient on sinus rhythm, plan to continue rate control with carvedilol .  Continue with dofetilide .  Anticoagulation with warfarin  Her discharge INR is 2.3

## 2024-09-18 NOTE — Hospital Course (Addendum)
 Mrs. Hagerty was admitted to the hospital with the working diagnosis of heart failure exacerbation   78 yo female with the past medical history of hypertension, hyperlipidemia, coronary artery disease, paroxysmal atrial fibrillation, rheumatoid arthritis, and crohn's disease who presented with dyspnea.  Reported several days of worsening dyspnea and uncontrolled hypertension for several days prior to admission. Her blood pressure was persistent elevated despite outpatient increased of blood pressure regimen.  On her initial physical examination her blood pressure was 159/59, HR 66, RR 23 and 02 saturation 96% Lungs with decreased breath sounds, with no wheezing, heart with S1 and S2 present and regular with no gallops, abdomen with no distention, positive lower extremity edema.   Na 141, K 4.0 Cl 108 bicarbonate 22 glucose 101, bun 17 cr 1,20  BNP 1,773  High sensitive troponin 26  Wbc 6,1 hgb 11.0 plt 187   Chest radiograph with mild cardiomegaly, bilateral hilar vascular congestion and small bilateral pleural effusions   EKG 69 bpm, normal axis, qtc 460, normal intervals, sinus rhythm with no significant ST segment or T wave changes.   CT chest with no evidence of aortic dissection or aneurysm.  Small to moderate sized bilateral pleural effusions with compressive atelectasis in the lower lobes.  Mediastinal lymphadenopathy  Small amount of air in the bladder.  Colonic diverticulosis   Patient was placed on IV furosemide  for diuresis.  Echocardiogram with preserved LV systolic function.

## 2024-09-18 NOTE — Progress Notes (Signed)
 Mobility Specialist Progress Note:    09/18/24 1422  Mobility  Activity Ambulated with assistance  Level of Assistance Minimal assist, patient does 75% or more  Assistive Device Front wheel walker  Distance Ambulated (ft) 10 ft  Range of Motion/Exercises Active  Activity Response Tolerated fair  Mobility Referral Yes  Mobility visit 1 Mobility  Mobility Specialist Start Time (ACUTE ONLY) 1422  Mobility Specialist Stop Time (ACUTE ONLY) 1430  Mobility Specialist Time Calculation (min) (ACUTE ONLY) 8 min   Received pt in room preparing for d/c. No c/o any symptoms. Pt able to move and ambulate w/ device and CGA to MinA. Left pt in room w/ family and all needs met.   Venetia Keel Mobility Specialist Please Neurosurgeon or Rehab Office at [phone]

## 2024-09-18 NOTE — Assessment & Plan Note (Signed)
 Anemia of chronic disease.  Follow up hgb at 9,1 with plt at 188  Plan to follow up as outpatient

## 2024-09-18 NOTE — Assessment & Plan Note (Addendum)
 No acute flare  Plan to continue outpatient management with infliximab  and methotrexate

## 2024-09-18 NOTE — Assessment & Plan Note (Addendum)
 Echocardiogram with preserved LV systolic function with EF 65 to 70%, mild LVH, RV systolic function preserved, LA with moderate dilatation, no significant valvular disease.   Patient was placed on IV furosemide  for diuresis.  Negative fluid balance was achieved, -5,901 ml, with significant improvement in her symptoms.   Plan to continue medical therapy with carvedilol , valsartan  and spironolactone .  Loop diuretic for diuresis.  Due to body habitus and immunosuppression will hold on SGLT 2 inh for now.   Acute hypoxemic respiratory failure, positive tachypnea and increased work of breathing on admission. Acute cardiogenic pulmonary edema, with bilateral pleural effusions.  Patient tolerated well diuresis, her 02 saturation is 91% on room air at rest and desaturates to 87% on exertion.  Patient will have supplemental 02 at home.

## 2024-09-18 NOTE — Assessment & Plan Note (Addendum)
 Positive chronic diarrhea, but no signs of acute flare.  Plan to continue outpatient management.  Patient on infliximab  and methotrexate   Stool panel tested positive for salmonella, considering patient's clinical picture, of overall improving, will plan to hold on antibiotic therapy. She will follow up with GI as outpatient, (discussed with GI).

## 2024-09-18 NOTE — Assessment & Plan Note (Addendum)
Continue proton pump inhibitor

## 2024-09-18 NOTE — Progress Notes (Signed)
   Durable Medical Equipment (From admission, onward)        Start     Ordered  09/18/24 1153  For home use only DME oxygen  Once      Question Answer Comment Length of Need 12 Months  Mode or (Route) Nasal cannula  Liters per Minute 2  Frequency Continuous (stationary and portable oxygen unit needed)  Oxygen conserving device Yes  Oxygen delivery system: Gas    09/18/24 1152         Zelphia Izetta FALCON, PT  Physical Therapist Physical Therapy   Progress Notes    Signed   Date of Service: 09/18/2024  1:32 PM   Signed      SATURATION QUALIFICATIONS: (This note is used to comply with regulatory documentation for home oxygen)   Patient Saturations on Room Air at Rest = 95%   Patient Saturations on Room Air while Ambulating = 86%   Patient Saturations on 2 Liters of oxygen while Ambulating = 94%   Please briefly explain why patient needs home oxygen: Pt unable to maintain SpO2 >90% when ambulating without 2L O2.    Izetta Zelphia, PT, DPT    Acute Rehabilitation Department Office [phone] Secure Chat Communication Preferred

## 2024-09-18 NOTE — Assessment & Plan Note (Signed)
 No chest pain, no acute coronary syndrome  High sensitive troponin elevation due to heart failure decompensation

## 2024-09-18 NOTE — Progress Notes (Signed)
 SATURATION QUALIFICATIONS: (This note is used to comply with regulatory documentation for home oxygen)  Patient Saturations on Room Air at Rest = 95%  Patient Saturations on Room Air while Ambulating = 86%  Patient Saturations on 2 Liters of oxygen while Ambulating = 94%  Please briefly explain why patient needs home oxygen: Pt unable to maintain SpO2 >90% when ambulating without 2L O2.   Izetta Call, PT, DPT   Acute Rehabilitation Department Office [phone] Secure Chat Communication Preferred

## 2024-09-18 NOTE — Progress Notes (Signed)
 PHARMACY - ANTICOAGULATION CONSULT NOTE  Pharmacy Consult for warfarin Indication: atrial fibrillation  Patient Measurements: Height: 5' 3 (160 cm) Weight: 104.7 kg (230 lb 13.2 oz) IBW/kg (Calculated) : 52.4 HEPARIN DW (KG): 77.3  Vital Signs: Temp: 98.5 F (36.9 C) (11/26 0815) Temp Source: Oral (11/26 0815) BP: 128/36 (11/26 0815) Pulse Rate: 57 (11/26 0815)  Labs: Recent Labs    09/16/24 0214 09/17/24 0222 09/18/24 0222  HGB 9.1*  --   --   HCT 28.5*  --   --   PLT 188  --   --   LABPROT 30.3* 27.7* 26.1*  INR 2.7* 2.4* 2.3*  CREATININE 1.68* 1.68* 1.70*    Estimated Creatinine Clearance: 31.1 mL/min (A) (by C-G formula based on SCr of 1.7 mg/dL (H)).   Medical History: Past Medical History:  Diagnosis Date   Allergy    Anxiety    Arthritis    Atrial fibrillation (HCC) 2008   Blood transfusion without reported diagnosis    CAD (coronary artery disease)    Crohn's colitis (HCC)    she reports ulcerative colitis beginning in her 77's, biopsies 2008 suggest Crohn's not UC   Depression    Diverticulosis    GERD (gastroesophageal reflux disease)    Hyperlipidemia    Hypertension    IBS (irritable bowel syndrome)    Keloid of skin    Obesity    OSA (obstructive sleep apnea)    Pancreatitis    Paroxysmal atrial fibrillation (HCC)    Pseudoaneurysm of right femoral artery    Renal oncocytoma    s/p right nephrectomy   Rheumatoid arthritis (HCC) 05/29/2017   Sleep apnea    UC (ulcerative colitis) (HCC)     Medications:  -Warfarin 1.25mg  PO every Sun, Tues, Thurs; 2.5mg  PO all other days   Assessment: 63 yoF presented with shortness of breath. Pharmacy consulted to dose warfarin for history of atrial fibrillation.  INR within goal.  Will resume PTA dose.  Goal of Therapy:  INR 2-3 Monitor platelets by anticoagulation protocol: Yes   Plan:  -Warfarin 1.25mg  PO every Sun, Tues, Thurs; 2.5mg  PO all other days -INR/CBC daily  [employer],  Pharm.D., BCPS Clinical Pharmacist Clinical phone for 09/18/2024 from 7:30-3:00 is [phone].  **Pharmacist phone directory can be found on [email or web address] listed under [facility].  09/18/2024 10:08 AM

## 2024-09-18 NOTE — TOC Transition Note (Signed)
 Transition of Care [facility]) - Discharge Note   Patient Details  Name: [name] MRN: [id number] Date of Birth: 1946-08-10  Transition of [facility]) CM/SW Contact:  Roxie KANDICE Stain, RN Phone Number: 09/18/2024, 2:39 PM   Clinical Narrative:    Spoke to patient's spouse, gray, regarding discharge needs.  Patient has orders for Home health and home 02.  [facility], [facility] is aware of discharge.  Jermaine with [employer] notified of home 02 need.     Final next level of care: Home w Home Health Services Barriers to Discharge: Barriers Resolved   Patient Goals and CMS Choice Patient states their goals for this hospitalization and ongoing recovery are:: return home CMS Medicare.gov Compare Post Acute Care list provided to:: Patient Choice offered to / list presented to : Patient      Discharge Placement               Home        Discharge Plan and Services Additional resources added to the After Visit Summary for                  DME Arranged: Oxygen DME Agency: [employer] Date DME Agency Contacted: 09/18/24 Time DME Agency Contacted: 1425 Representative spoke with at DME Agency: jermaine HH Arranged: PT, OT HH Agency: Other - See comment ([facility]) Date HH Agency Contacted: 09/18/24 Time HH Agency Contacted: 1426 Representative spoke with at [facility] Agency: [facility]  Social Drivers of Health (SDOH) Interventions SDOH Screenings   Food Insecurity: No Food Insecurity (09/13/2024)  Housing: Low Risk  (09/13/2024)  Transportation Needs: No Transportation Needs (09/13/2024)  Utilities: Not At Risk (09/13/2024)  Alcohol Screen: Low Risk  (06/08/2024)  Depression (PHQ2-9): Low Risk  (06/11/2024)  Financial Resource Strain: Low Risk  (06/08/2024)  Physical Activity: Patient Declined (06/08/2024)  Social Connections: Unknown (09/13/2024)  Stress: No Stress Concern Present (06/08/2024)  Tobacco Use:  Low Risk  (09/12/2024)  Health Literacy: Adequate Health Literacy (06/11/2024)     Readmission Risk Interventions    09/17/2024    8:50 AM  Readmission Risk Prevention Plan  Transportation Screening Complete  PCP or Specialist Appt within 5-7 Days Complete  Home Care Screening Complete  Medication Review (RN CM) Complete

## 2024-09-18 NOTE — Assessment & Plan Note (Addendum)
 Continue blood pressure control with valsartan  and carvedilol  Diuresis with spironolactone  and loop diuretic

## 2024-09-18 NOTE — Assessment & Plan Note (Signed)
Continue with escitalopram.  ?

## 2024-09-18 NOTE — Assessment & Plan Note (Signed)
 Calculated BMI is 40.8

## 2024-09-19 ENCOUNTER — Ambulatory Visit: Payer: Self-pay | Admitting: Cardiology

## 2024-09-19 ENCOUNTER — Encounter: Payer: Self-pay | Admitting: Family Medicine

## 2024-09-19 ENCOUNTER — Encounter (HOSPITAL_BASED_OUTPATIENT_CLINIC_OR_DEPARTMENT_OTHER): Payer: Self-pay

## 2024-09-20 ENCOUNTER — Encounter: Payer: Self-pay | Admitting: Cardiology

## 2024-09-20 ENCOUNTER — Telehealth: Payer: Self-pay

## 2024-09-20 NOTE — Patient Instructions (Signed)
 Visit Information  Thank you for taking time to visit with me today. Please don't hesitate to contact me if I can be of assistance to you before our next scheduled telephone appointment.  Our next appointment is by telephone on Thursday December 4th at 11:00am  Following is a copy of your care plan:   Goals Addressed             This Visit's Progress    VBCI Transitions of Care (TOC) Care Plan       Problems:  Recent Hospitalization for treatment of CHF Hospital or ED Adm Risk 57%  Goal:  Over the next 30 days, the patient will not experience hospital readmission  Interventions:   Heart Failure Interventions: Basic overview and discussion of pathophysiology of Heart Failure reviewed Provided education on low sodium diet Assessed need for readable accurate scales in home Advised patient to weigh each morning after emptying bladder Discussed importance of daily weight and advised patient to weigh and record daily Reviewed role of diuretics in prevention of fluid overload and management of heart failure; Discussed the importance of keeping all appointments with provider Provided patient with education about the role of exercise in the management of heart failure Take Spirnolactone and Torsemide  daily as directed O2 at night at 2l/min as needed  Patient Self Care Activities:  Attend all scheduled provider appointments Attend church or other social activities Call provider office for new concerns or questions  Notify RN Care Manager of TOC call rescheduling needs Participate in Transition of Care Program/Attend TOC scheduled calls Perform all self care activities independently  Take medications as prescribed   The patient has A-fib and is on Coumadin  - Take Warfarin as directed and follow up with Coumadin  clinic for PT/INR  Plan:  Telephone follow up appointment with care management team member scheduled for:  Thursday December 4th at 11:00am        Patient verbalizes  understanding of instructions and care plan provided today and agrees to view in MyChart. Active MyChart status and patient understanding of how to access instructions and care plan via MyChart confirmed with patient.     The patient has been provided with contact information for the care management team and has been advised to call with any health related questions or concerns.   Please call the care guide team at [phone] if you need to cancel or reschedule your appointment.   Please call the Suicide and Crisis Lifeline: 988 call the [place]  National Suicide Prevention Lifeline: (519)093-0564 or TTY: 606-594-1939 TTY (504)031-2674) to talk to a trained counselor if you are experiencing a Mental Health or Behavioral Health Crisis or need someone to talk to.  Medford Balboa, BSN, RN [place]  VBCI - [employer] [facility] RN Care Manager [phone]

## 2024-09-20 NOTE — Transitions of Care (Post Inpatient/ED Visit) (Signed)
 09/20/2024  Name: [name] MRN: [id number] DOB: 11/14/1945  Today's TOC FU Call Status: Today's TOC FU Call Status:: Successful TOC FU Call Completed TOC FU Call Complete Date: 09/20/24  Patient's Name and Date of Birth confirmed. Name, DOB  Transition Care Management Follow-up Telephone Call Date of Discharge: 09/18/24 Discharge Facility: [facility]) Type of Discharge: Inpatient Admission Primary Inpatient Discharge Diagnosis:: CHF exacerbation How have you been since you were released from the hospital?: Better Any questions or concerns?: Yes Patient Questions/Concerns:: Had questions regarding INR Patient Questions/Concerns Addressed: Other: (Reviewed lab work and what the numbers meant)  Items Reviewed: Did you receive and understand the discharge instructions provided?: Yes Medications obtained,verified, and reconciled?: Yes (Medications Reviewed) Any new allergies since your discharge?: No Dietary orders reviewed?: Yes Type of Diet Ordered:: Low sodium Heart Healthy Do you have support at home?: Yes People in Home [RPT]: spouse Name of Support/Comfort Primary Source: [place]  Medications Reviewed Today: Medications Reviewed Today     Reviewed by Moises Reusing, RN (Case Manager) on 09/20/24 at 1105  Med List Status: <None>   Medication Order Taking? Sig Documenting Provider Last Dose Status Informant  carvedilol  (COREG ) 25 MG tablet [id number]  Take 1 tablet (25 mg total) by mouth 2 (two) times daily with a meal. Arrien, Elidia Sieving, MD  Active   clobetasol ointment (TEMOVATE) 0.05 % [id number]  Apply 1 Application topically 2 (two) times daily.  Patient taking differently: Apply 1 Application topically as needed.   [provider]  Active Self, Pharmacy Records  dextrose 5 % SOLN 50 mL with methotrexate 25 MG/ML (PF) SOLN 40 mg/m2 [id number]  Inject 40 mg/m2 into the vein once a week. Every wednesday [provider]  Active  Self, Pharmacy Records  dofetilide  (TIKOSYN ) 125 MCG capsule [id number]  Take 2 capsules (250 mcg total) by mouth 2 (two) times daily. Arrien, Elidia Sieving, MD  Active   escitalopram  (LEXAPRO ) 10 MG tablet [id number]  Take 1 tablet (10 mg total) by mouth daily. Domenica Harlene LABOR, MD  Active Self, Pharmacy Records  esomeprazole  (NEXIUM ) 40 MG capsule [id number]  Take 1 capsule (40 mg total) by mouth daily. Take in the morning before breakfast.  Patient taking differently: Take 40 mg by mouth 2 (two) times daily before a meal.   Esterwood, Amy S, PA-C  Active Self, Pharmacy Records  folic acid  (FOLVITE ) 1 MG tablet [id number]  Take 2 mg by mouth daily.  Patient taking differently: Take 1 mg by mouth 2 (two) times daily.   [provider]  Active Self, Pharmacy Records  hydrocortisone  (ANUSOL -HC) 25 MG suppository [id number]  Place 1 suppository (25 mg total) rectally 2 (two) times daily as needed for hemorrhoids or anal itching. Avram Lupita BRAVO, MD  Active Self, Pharmacy Records  inFLIXimab  10 mg/kg in sodium chloride  0.9 % [id number]  Inject 10 mg/kg into the vein every 6 (six) weeks. [provider]  Active Self, Pharmacy Records           Med Note (SATTERFIELD, TEENA BRAVO   Fri Sep 13, 2024  3:57 PM) Next dose was suppose to be on 09-10-24 however pt did not have dose   Methotrexate Sodium (METHOTREXATE, PF,) 50 MG/2ML injection [id number]  Inject 2 mLs into the muscle once a week. [provider]  Active Self, Pharmacy Records           Med Note (SATTERFIELD, TEENA BRAVO   Fri Sep 13, 2024  4:05 PM)  Patient and daughter verified she is taking weekly usually on Wednesdays   Multiple Vitamin (MULTIVITAMIN) capsule [id number]  Take 1 capsule by mouth daily. 1 daily [provider]  Active Self, Pharmacy Records  nitroGLYCERIN  (NITROSTAT ) 0.4 MG SL tablet [id number]  Place 1 tablet (0.4 mg total) under the tongue every 5 (five) minutes as needed for chest pain. Do not exceed 3  tabs in 15 minutes Pietro Redell RAMAN, MD  Active Self, Pharmacy Records  OXYGEN [id number] Yes Inhale 2 L/min into the lungs at bedtime. [provider]  Active   Probiotic Product [facility]) CAPS [id number]  Take 1 capsule by mouth daily. 1 per day [provider]  Active Self, Pharmacy Records  spironolactone  (ALDACTONE ) 25 MG tablet [id number]  Take 0.5 tablets (12.5 mg total) by mouth daily. Daneen Damien BROCKS, NP  Active Self, Pharmacy Records  torsemide  (DEMADEX ) 20 MG tablet [id number]  Take 2 tablets (40 mg total) by mouth daily. Arrien, Elidia Sieving, MD  Active   TUBERCULIN SYR 1CC/27GX1/2 27G X 1/2 1 ML MISC [id number]   [provider]  Active Self, Pharmacy Records  valsartan  (DIOVAN ) 160 MG tablet [id number]  TAKE 1 AND 1/2 TABLETS ONCE DAILY  Patient taking differently: Take 160 mg by mouth 2 (two) times daily. TAKE 1 TWICE A DAY   Crenshaw, Redell RAMAN, MD  Active Self, Pharmacy Records  warfarin (COUMADIN ) 2.5 MG tablet [id number]  TAKE 1/2 TABLET TO 1 TABLET BY MOUTH DAILY AS DIRECTED BY COUMADIN  CLINIC  Patient taking differently: Take 1.25-2.5 mg by mouth See admin instructions. TAKE 1/2 TABLET TO 1 TABLET BY MOUTH ON SUNDAYS, TUESDAYS AND THURSDAYS, THEN TAKE WHOLE (2.5 MG) TABLET REST OF DAYS   Crenshaw, Redell RAMAN, MD  Active Self, Pharmacy Records            Home Care and Equipment/Supplies: Were Home Health Services Ordered?: Yes Name of Home Health Agency:: [facility] Has Agency set up a time to come to your home?: No EMR reviewed for Home Health Orders: Orders present/patient has not received call (refer to CM for follow-up) (The patient did receive a call and is waiting on a day) Any new equipment or medical supplies ordered?: Yes Name of Medical supply agency?: [employer] Were you able to get the equipment/medical supplies?: Yes Do you have any questions related to the use of the equipment/supplies?: No  Functional Questionnaire: Do  you need assistance with bathing/showering or dressing?: No Do you need assistance with meal preparation?: No Do you need assistance with eating?: No Do you have difficulty maintaining continence: Yes (Wears adult depends) Do you need assistance with getting out of bed/getting out of a chair/moving?: No Do you have difficulty managing or taking your medications?: No  Follow up appointments reviewed: PCP Follow-up appointment confirmed?: Yes Date of PCP follow-up appointment?: 09/24/24 Follow-up Provider: Glade Horton Specialist [facility] Follow-up appointment confirmed?: Yes Date of Specialist follow-up appointment?: 11/14/24 Follow-Up Specialty Provider:: Reche Finder Do you need transportation to your follow-up appointment?: No Do you understand care options if your condition(s) worsen?: Yes-patient verbalized understanding  SDOH Interventions Today    Flowsheet Row Most Recent Value  SDOH Interventions   Food Insecurity Interventions Intervention Not Indicated  Housing Interventions Intervention Not Indicated  Transportation Interventions Intervention Not Indicated  Utilities Interventions Intervention Not Indicated    Goals Addressed             This Visit's Progress    VBCI Transitions of Care (TOC)  Care Plan       Problems:  Recent Hospitalization for treatment of CHF Hospital or ED Adm Risk 57%  Goal:  Over the next 30 days, the patient will not experience hospital readmission  Interventions:   Heart Failure Interventions: Basic overview and discussion of pathophysiology of Heart Failure reviewed Provided education on low sodium diet Assessed need for readable accurate scales in home Advised patient to weigh each morning after emptying bladder Discussed importance of daily weight and advised patient to weigh and record daily Reviewed role of diuretics in prevention of fluid overload and management of heart failure; Discussed the importance of keeping all  appointments with provider Provided patient with education about the role of exercise in the management of heart failure Take Spirnolactone and Torsemide  daily as directed O2 at night at 2l/min as needed  Patient Self Care Activities:  Attend all scheduled provider appointments Attend church or other social activities Call provider office for new concerns or questions  Notify RN Care Manager of TOC call rescheduling needs Participate in Transition of Care Program/Attend TOC scheduled calls Perform all self care activities independently  Take medications as prescribed   The patient has A-fib and is on Coumadin  - Take Warfarin as directed and follow up with Coumadin  clinic for PT/INR  Plan:  Telephone follow up appointment with care management team member scheduled for:  Thursday December 4th at 11:00am        Medford Balboa, BSN, RN [place]  VBCI - Population Health RN Care Manager [phone]

## 2024-09-22 LAB — CALPROTECTIN, FECAL: Calprotectin, Fecal: 671 ug/g — ABNORMAL HIGH (ref 0–120)

## 2024-09-23 ENCOUNTER — Encounter: Payer: Self-pay | Admitting: Family Medicine

## 2024-09-24 ENCOUNTER — Encounter: Payer: Self-pay | Admitting: Family Medicine

## 2024-09-24 ENCOUNTER — Encounter (HOSPITAL_BASED_OUTPATIENT_CLINIC_OR_DEPARTMENT_OTHER): Payer: Self-pay

## 2024-09-24 ENCOUNTER — Ambulatory Visit: Admitting: Family Medicine

## 2024-09-24 ENCOUNTER — Telehealth: Payer: Self-pay

## 2024-09-24 VITALS — BP 134/80 | HR 54 | Temp 97.8°F | Resp 18 | Ht 63.0 in | Wt 226.0 lb

## 2024-09-24 DIAGNOSIS — I4819 Other persistent atrial fibrillation: Secondary | ICD-10-CM

## 2024-09-24 DIAGNOSIS — I10 Essential (primary) hypertension: Secondary | ICD-10-CM

## 2024-09-24 DIAGNOSIS — I5033 Acute on chronic diastolic (congestive) heart failure: Secondary | ICD-10-CM

## 2024-09-24 DIAGNOSIS — I1A0 Resistant hypertension: Secondary | ICD-10-CM | POA: Diagnosis not present

## 2024-09-24 DIAGNOSIS — M069 Rheumatoid arthritis, unspecified: Secondary | ICD-10-CM | POA: Diagnosis not present

## 2024-09-24 DIAGNOSIS — E1065 Type 1 diabetes mellitus with hyperglycemia: Secondary | ICD-10-CM | POA: Insufficient documentation

## 2024-09-24 DIAGNOSIS — I129 Hypertensive chronic kidney disease with stage 1 through stage 4 chronic kidney disease, or unspecified chronic kidney disease: Secondary | ICD-10-CM | POA: Diagnosis not present

## 2024-09-24 DIAGNOSIS — Z905 Acquired absence of kidney: Secondary | ICD-10-CM

## 2024-09-24 DIAGNOSIS — K50119 Crohn's disease of large intestine with unspecified complications: Secondary | ICD-10-CM

## 2024-09-24 DIAGNOSIS — N1831 Chronic kidney disease, stage 3a: Secondary | ICD-10-CM | POA: Diagnosis not present

## 2024-09-24 MED ORDER — NITROGLYCERIN 0.4 MG SL SUBL: SUBLINGUAL_TABLET | SUBLINGUAL | 3 refills | Status: AC

## 2024-09-24 MED ORDER — PANTOPRAZOLE SODIUM 20 MG PO TBEC: 20.0000 mg | DELAYED_RELEASE_TABLET | Freq: Every day | ORAL | 1 refills | Status: DC

## 2024-09-24 NOTE — Assessment & Plan Note (Signed)
 Meds adjusted in hosp Nephrology pending

## 2024-09-24 NOTE — Assessment & Plan Note (Signed)
Per rheum 

## 2024-09-24 NOTE — Assessment & Plan Note (Signed)
 Per cardiology

## 2024-09-24 NOTE — Telephone Encounter (Signed)
 Spoke w/ April- verbal orders given

## 2024-09-24 NOTE — Assessment & Plan Note (Signed)
Per gi  

## 2024-09-24 NOTE — Telephone Encounter (Signed)
 Copied from CRM [id number]. Topic: Clinical - Home Health Verbal Orders >> Sep 24, 2024  4:01 PM Tinnie BROCKS wrote: Caller/Agency: April, PT with [facility] Callback Number: [phone] Service Requested: Physical Therapy Frequency: 2 wk 3, 1 wk 6 Any new concerns about the patient? No

## 2024-09-24 NOTE — Assessment & Plan Note (Signed)
 F/u cardiology Pt will be transferring to drs in [place]

## 2024-09-24 NOTE — Assessment & Plan Note (Signed)
Nephrology pending.   

## 2024-09-24 NOTE — Assessment & Plan Note (Signed)
 Well controlled, no changes to meds. Encouraged heart healthy diet such as the DASH diet and exercise as tolerated.

## 2024-09-24 NOTE — Progress Notes (Addendum)
 "  Subjective:    Patient ID: [name], female    DOB: 03-23-1946, 78 y.o.   MRN: [id number]  Chief Complaint  Patient presents with   Hospitalization Follow-up    HTN,     HPI Patient is in today for f/u hosp.  Discussed the use of AI scribe software for clinical note transcription with the patient, who gave verbal consent to proceed.  History of Present Illness [name] is a 78 year old female with congestive heart failure and kidney failure who presents with shortness of breath and medication management.  She has a history of shortness of breath, which led to a recent hospitalization where she was diagnosed with congestive heart failure and kidney failure. Her shortness of breath has improved since discharge. Initially on Lasix  and potassium, she is now taking spironolactone  and torsemide  to manage fluid retention.  She has a history of Crohn's disease and has been receiving Remicade  infusions. Doxazosin , previously part of her regimen, has been discontinued. She is in contact with her rheumatologist regarding her treatment plan moving forward.  Her blood pressure was very high prior to her hospitalization, with readings over 200 mmHg for several days. She attempted to manage her blood pressure until her symptoms worsened, prompting her visit to the emergency room.  She has a history of anemia and was advised to increase her red meat intake prior to hospitalization. However, during her hospital stay, she was not allowed to consume red meat, possibly due to her kidney issues.  She has experienced changes in her hearing and eyesight, as well as dry mouth, which she attributes to the diuretics she is taking. She uses Systane for dry eyes and biotin for dry mouth.  She has a history of gastrointestinal issues, including a past ovarian mass that resolved, and ongoing concerns about her pancreas, kidneys, and colon. She has been under the care of a gastroenterologist, Dr.  Wilkins, and has undergone various diagnostic tests, including a CT scan, MRI, and ultrasound, which have not revealed significant issues.  She mentions a past traumatic childbirth experience that resulted in tearing, and she has had hemorrhoids checked by her healthcare providers, which were deemed non-problematic.    Past Medical History:  Diagnosis Date   Abnormal gait 08/18/2021   Acute midline thoracic back pain 09/07/2023   Acute on chronic diastolic CHF (congestive heart failure) (HCC) 09/13/2024   Acute respiratory failure with hypoxia (HCC) 09/12/2024   Afib (HCC) 01/15/2009   Qualifier: History of   By: Georgian ROSALEA CHARM Lamar        Allergic rhinitis 09/06/2007   Qualifier: Diagnosis of   By: Georgian ROSALEA CHARM Lamar     IMO SNOMED Dx Update Oct 2024     Allergy    Anemia 06/09/2024   Anxiety    Arthritis    Basal cell carcinoma 02/07/2012   Follows with [facility], Dr Dale     Bilateral leg edema 09/12/2012   Blepharitis 11/17/2019   Blood transfusion without reported diagnosis    CAD (coronary artery disease)    Cerebrovascular disease 07/30/2014   Chest pain 09/11/2023   Chronic kidney disease, stage 3a (HCC) 09/18/2024   Coronary atherosclerosis 09/06/2007   Centricity Description: CORONARY ARTERY DISEASE  Qualifier: Diagnosis of   By: Anice CMA, Darlene      Centricity Description: CAD, UNSPECIFIED SITE  Qualifier: Diagnosis of   By: Pietro, MD, CODY Redell Dimes        COVID-19  08/28/2023   Crohn's colitis (HCC)    she reports ulcerative colitis beginning in her 65's, biopsies 2008 suggest Crohn's not UC   Crohn's disease (HCC) 08/18/2021   Depression    DEPRESSION/ANXIETY 09/06/2007   Qualifier: Diagnosis of   By: Anice CMA, Darlene         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   Qualifier: Diagnosis of   By: Georgian ROSALEA CHARM Lamar  [phone]  10/2006 had a right kidney removed for a possible infection? Lesion?  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         Diverticulosis    Dry eyes 08/05/2018   Dry mouth 08/22/2016   Elevated troponin 09/12/2023   Essential hypertension 09/06/2007   Qualifier: Diagnosis of   By: Anice CMA, Darlene         GERD (gastroesophageal reflux disease)    H/O right nephrectomy 09/10/2024   Headache 02/24/2009   Qualifier: Diagnosis of   By: Georgian ROSALEA CHARM Lamar     IMO SNOMED Dx Update Oct 2024     Hearing loss 09/06/2007   Qualifier: Diagnosis of   By: Anice CMA, Darlene         Hip pain, chronic, left 12/03/2018   History of colonic polyps 07/04/2007   2 DIMINUTIVE SIGMOID ADENOMAS 9/08  08/2012 - 3 diminutive adenomas     IMO SNOMED Dx Update Oct 2024     History of malignant neoplasm of skin 08/18/2021   Hyperglycemia 07/02/2009   Qualifier: Diagnosis of   By: Georgian ROSALEA CHARM Lamar        Hyperlipidemia    Hypertension    Hypertensive urgency 09/13/2024   IBS (irritable bowel syndrome)    Intertrigo 11/12/2013   Keloid of skin    Low back pain 01/07/2009   Qualifier: Diagnosis of   By: Avram MD, NOLIA Pitts E        Maxillary sinus cyst 02/07/2012   Medicare annual wellness visit, subsequent 08/17/2012   Sees Dr Pietro of cardiology  Sees [facility] pulmonology  [facility]  Sees Dr Avram, [facility], last colonoscopy 09/10/12, will have repeat colonoscopy soon     Morbid obesity (HCC) 09/25/2023   MYOCARDIAL INFARCTION, HX OF 09/06/2007   Qualifier: Diagnosis of   By: Anice CMA, Darlene         Obesity, class 3 (HCC) 01/03/2008   Qualifier: Diagnosis of   By: Derrek Ramp         Obesity, Class III, BMI 40-49.9 (morbid obesity) (HCC) 09/13/2024   Onychomycosis 06/03/2018   OSA (obstructive sleep apnea)    Osteoporosis 11/05/2007            Other acne 04/21/2014   Other specified disorders of kidney and ureter 09/07/2023   Pain in joint of right shoulder 04/21/2014   Pancreatitis    Paroxysmal atrial fibrillation (HCC)    Pseudoaneurysm of right femoral  artery    Psoriasis 08/04/2021   Radicular low back pain 06/28/2012   L1-2: No impingement. Mild disc bulge.   L2-3: Borderline right foraminal stenosis due to disc bulge.   L3-4: Mild left foraminal stenosis due to disc bulge,   intervertebral spurring, and facet arthropathy.   L4-5: Mild right and borderline left foraminal stenosis with mild   bilateral subarticular lateral recess stenosis and borderline   central stenosis due to facet arthropathy and disc bulge.    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   Shoulder pain 06/09/2024   Sinusitis 06/30/2014   Sleep apnea    Statin intolerance 02/28/2011   Type 1 diabetes mellitus with hyperglycemia (HCC) 09/24/2024   UC (ulcerative colitis) (HCC)    Uncontrolled hypertension 09/10/2024   Uveitis 08/18/2021   Vitamin D  deficiency 08/04/2021    Past Surgical History:  Procedure Laterality Date   ANGIOPLASTY     BREAST BIOPSY  10/24/1994   benign lesion    Cataract surgery Left 11/13/2014   CHOLECYSTECTOMY  10/24/2006   COLONOSCOPY W/ BIOPSIES     CORONARY ANGIOPLASTY     ESOPHAGOGASTRODUODENOSCOPY     EYE SURGERY     NEPHRECTOMY  10/24/2006   right secondary to oncocytoma    RIGHT FEMORAL  PSEUDOANEURYSM & RIGHT GROIN HEMATOMA EVACUATION  02/22/2007   RIGHT RENAL ARTERY REPAIR  10/24/1974   TUBAL LIGATION      Family History  Problem Relation Age of Onset   Hypertension Mother    Coronary artery disease Mother    Stroke Mother    Clotting disorder Mother    Heart disease Mother    Allergies Mother    Colitis Father    Heart disease Father    Clotting disorder Sister    Stroke Brother    Clotting disorder Brother    Cancer Daughter    Asthma Daughter    Asthma Maternal Uncle    Colon cancer Paternal Aunt    Cancer Daughter    Colon polyps Neg Hx    Kidney disease Neg Hx    Stomach  cancer Neg Hx    Esophageal cancer Neg Hx     Social History   Socioeconomic History   Marital status: Married    Spouse name: Not on file   Number of children: 3   Years of education: Not on file   Highest education level: 12th grade  Occupational History   Occupation: Retired    Associate Professor: RETIRED    Comment: retired  Tobacco Use   Smoking status: Never   Smokeless tobacco: Never   Tobacco comments:    Never smoked 03/06/24  Vaping Use   Vaping status: Never Used  Substance and Sexual Activity   Alcohol use: No   Drug use: No   Sexual activity: Not Currently    Birth control/protection: Condom, Other-see comments    Comment: Tbal ligation  Other Topics Concern   Not on file  Social History Narrative   Married since 1965 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, 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 - adults, 2 grandchildrenNo pets -Daughter has breast cancer   Social Drivers of Health   Tobacco Use: Low Risk (11/04/2024)   Patient History    Smoking Tobacco Use: Never    Smokeless Tobacco Use: Never    Passive Exposure: Not on file  Financial Resource Strain: Low Risk (10/17/2024)   Overall Financial Resource Strain (CARDIA)    Difficulty of Paying Living Expenses: Not hard at all  Food Insecurity: No Food Insecurity (10/17/2024)   Epic    Worried About Radiation Protection Practitioner of Food in the Last Year: Never true    [employer] of Food in the Last Year: Never true  Transportation Needs: No Transportation Needs (10/17/2024)   Epic    Lack of Transportation (Medical): No    Lack of Transportation (Non-Medical): No  Physical Activity: Inactive (10/17/2024)  Exercise Vital Sign    Days of Exercise per Week: 0 days    Minutes of Exercise per Session: Not on file  Stress: No Stress Concern Present (10/17/2024)   [employer] of Occupational Health - Occupational Stress Questionnaire    Feeling of Stress: Not at all  Social Connections: Moderately Integrated (10/17/2024)   Social  Connection and Isolation Panel    Frequency of Communication with Friends and Family: More than three times a week    Frequency of Social Gatherings with Friends and Family: More than three times a week    Attends Religious Services: Patient declined    Active Member of Clubs or Organizations: Yes    Attends Banker Meetings: Patient declined    Marital Status: Married  Catering Manager Violence: Not At Risk (09/20/2024)   Epic    Fear of Current or Ex-Partner: No    Emotionally Abused: No    Physically Abused: No    Sexually Abused: No  Depression (PHQ2-9): Low Risk (06/11/2024)   Depression (PHQ2-9)    PHQ-2 Score: 0  Alcohol Screen: Low Risk (06/08/2024)   Alcohol Screen    Last Alcohol Screening Score (AUDIT): 0  Housing: Unknown (10/17/2024)   Epic    Unable to Pay for Housing in the Last Year: No    Number of Times Moved in the Last Year: Not on file    Homeless in the Last Year: No  Utilities: Not At Risk (09/20/2024)   Epic    Threatened with loss of utilities: No  Health Literacy: Adequate Health Literacy (06/11/2024)   B1300 Health Literacy    Frequency of need for help with medical instructions: Never    Outpatient Medications Prior to Visit  Medication Sig Dispense Refill   clobetasol ointment (TEMOVATE) 0.05 % Apply 1 Application topically 2 (two) times daily.     dextrose 5 % SOLN 50 mL with methotrexate 25 MG/ML (PF) SOLN 40 mg/m2 Inject 40 mg/m2 into the vein once a week. Every wednesday     dofetilide  (TIKOSYN ) 125 MCG capsule Take 2 capsules (250 mcg total) by mouth 2 (two) times daily.     escitalopram  (LEXAPRO ) 10 MG tablet Take 1 tablet (10 mg total) by mouth daily. 90 tablet 1   esomeprazole  (NEXIUM ) 40 MG capsule Take 1 capsule (40 mg total) by mouth daily. Take in the morning before breakfast. 30 capsule 11   folic acid  (FOLVITE ) 1 MG tablet Take 2 mg by mouth daily.     hydrocortisone  (ANUSOL -HC) 25 MG suppository Place 1 suppository (25 mg  total) rectally 2 (two) times daily as needed for hemorrhoids or anal itching. 12 suppository 1   Methotrexate Sodium (METHOTREXATE, PF,) 50 MG/2ML injection Inject 2 mLs into the muscle once a week.     Multiple Vitamin (MULTIVITAMIN) capsule Take 1 capsule by mouth daily. 1 daily     OXYGEN  Inhale 2 L/min into the lungs at bedtime.     Probiotic Product (PHILLIPS COLON HEALTH) CAPS Take 1 capsule by mouth daily. 1 per day     spironolactone  (ALDACTONE ) 25 MG tablet Take 0.5 tablets (12.5 mg total) by mouth daily. 30 tablet 2   TUBERCULIN SYR 1CC/27GX1/2 27G X 1/2 1 ML MISC      valsartan  (DIOVAN ) 160 MG tablet TAKE 1 AND 1/2 TABLETS ONCE DAILY 135 tablet 3   warfarin (COUMADIN ) 2.5 MG tablet TAKE 1/2 TABLET TO 1 TABLET BY MOUTH DAILY AS DIRECTED BY COUMADIN  CLINIC 75 tablet  1   carvedilol  (COREG ) 25 MG tablet Take 1 tablet (25 mg total) by mouth 2 (two) times daily with a meal. 60 tablet 0   inFLIXimab  10 mg/kg in sodium chloride  0.9 % Inject 10 mg/kg into the vein every 6 (six) weeks.     nitroGLYCERIN  (NITROSTAT ) 0.4 MG SL tablet Place 1 tablet (0.4 mg total) under the tongue every 5 (five) minutes as needed for chest pain. Do not exceed 3 tabs in 15 minutes 75 tablet 3   torsemide  (DEMADEX ) 20 MG tablet Take 2 tablets (40 mg total) by mouth daily. 60 tablet 0   No facility-administered medications prior to visit.    Allergies  Allergen Reactions   Hydralazine  Diarrhea and Nausea Only    Anorexia, upset stomach, burning pain in abdomin   Cefdinir      Pt cannot take; may interfere with AFib.   Codeine     Heart beats fast    Fosamax [Alendronate Sodium] Other (See Comments)    GI upset   Gluten Meal    Guaifenesin Er Nausea And Vomiting and Other (See Comments)    Headaches; can tolerate liquid.   Latex Other (See Comments)    Breathing problems   Percocet [Oxycodone -Acetaminophen ]     Itching & swelling in jaw Pt said she can take tylenol    Psyllium Other (See Comments)     Review of Systems  Constitutional:  Negative for fever and malaise/fatigue.  HENT:  Negative for congestion.   Eyes:  Negative for blurred vision.  Respiratory:  Negative for shortness of breath.   Cardiovascular:  Negative for chest pain, palpitations and leg swelling.  Gastrointestinal:  Negative for abdominal pain, blood in stool and nausea.  Genitourinary:  Negative for dysuria and frequency.  Musculoskeletal:  Negative for falls.  Skin:  Negative for rash.  Neurological:  Negative for dizziness, loss of consciousness and headaches.  Endo/Heme/Allergies:  Negative for environmental allergies.  Psychiatric/Behavioral:  Negative for depression. The patient is not nervous/anxious.        Objective:    Physical Exam Vitals and nursing note reviewed.  Constitutional:      General: She is not in acute distress.    Appearance: Normal appearance. She is well-developed.  HENT:     Head: Normocephalic and atraumatic.  Eyes:     General: No scleral icterus.       Right eye: No discharge.        Left eye: No discharge.  Cardiovascular:     Rate and Rhythm: Normal rate and regular rhythm.     Heart sounds: No murmur heard. Pulmonary:     Effort: Pulmonary effort is normal. No respiratory distress.     Breath sounds: Normal breath sounds.  Musculoskeletal:        General: Swelling present. No tenderness. Normal range of motion.     Cervical back: Normal range of motion and neck supple.     Right lower leg: 1+ Pitting Edema present.     Left lower leg: 1+ Pitting Edema present.  Skin:    General: Skin is warm and dry.  Neurological:     Mental Status: She is alert and oriented to person, place, and time.  Psychiatric:        Mood and Affect: Mood normal.        Behavior: Behavior normal.        Thought Content: Thought content normal.        Judgment: Judgment normal.  BP 134/80 (BP Location: Left Arm, Patient Position: Sitting, Cuff Size: Large)   Pulse (!) 54    Temp 97.8 F (36.6 C) (Oral)   Resp 18   Ht 5' 3 (1.6 m)   Wt 226 lb (102.5 kg)   SpO2 95%   BMI 40.03 kg/m  Wt Readings from Last 3 Encounters:  11/04/24 219 lb 6 oz (99.5 kg)  10/21/24 220 lb (99.8 kg)  10/18/24 219 lb 3.2 oz (99.4 kg)    Diabetic Foot Exam - Simple   No data filed    Lab Results  Component Value Date   WBC 6.0 11/04/2024   HGB 10.6 (L) 11/04/2024   HCT 31.6 (L) 11/04/2024   PLT 193.0 11/04/2024   GLUCOSE 95 11/04/2024   CHOL 159 06/10/2024   TRIG 96.0 06/10/2024   HDL 47.30 06/10/2024   LDLCALC 92 06/10/2024   ALT 18 11/04/2024   AST 25 11/04/2024   NA 139 11/04/2024   K 4.4 11/04/2024   CL 103 11/04/2024   CREATININE 2.60 (H) 11/04/2024   BUN 64 (H) 11/04/2024   CO2 23 11/04/2024   TSH 3.64 06/10/2024   INR 3.6 (A) 11/12/2024   HGBA1C 5.8 06/10/2024    Lab Results  Component Value Date   TSH 3.64 06/10/2024   Lab Results  Component Value Date   WBC 6.0 11/04/2024   HGB 10.6 (L) 11/04/2024   HCT 31.6 (L) 11/04/2024   MCV 98.3 11/04/2024   PLT 193.0 11/04/2024   Lab Results  Component Value Date   NA 139 11/04/2024   K 4.4 11/04/2024   CO2 23 11/04/2024   GLUCOSE 95 11/04/2024   BUN 64 (H) 11/04/2024   CREATININE 2.60 (H) 11/04/2024   BILITOT 0.4 11/04/2024   ALKPHOS 66 11/04/2024   AST 25 11/04/2024   ALT 18 11/04/2024   PROT 7.5 11/04/2024   ALBUMIN 3.6 11/04/2024   CALCIUM  8.9 11/04/2024   ANIONGAP 9 09/18/2024   EGFR 46 (L) 09/02/2024   GFR 17.06 (L) 11/04/2024   Lab Results  Component Value Date   CHOL 159 06/10/2024   Lab Results  Component Value Date   HDL 47.30 06/10/2024   Lab Results  Component Value Date   LDLCALC 92 06/10/2024   Lab Results  Component Value Date   TRIG 96.0 06/10/2024   Lab Results  Component Value Date   CHOLHDL 3 06/10/2024   Lab Results  Component Value Date   HGBA1C 5.8 06/10/2024       Assessment & Plan:  H/O right nephrectomy  Resistant  hypertension  Hypertensive chronic kidney disease with stage 1 through stage 4 chronic kidney disease, or unspecified chronic kidney disease Assessment & Plan: Meds adjusted in hosp Nephrology pending    Acute on chronic diastolic CHF (congestive heart failure) (HCC) Assessment & Plan: F/u cardiology Pt will be transferring to drs in [place]   Persistent atrial fibrillation [facility]) Assessment & Plan: Per cardiology   Chronic kidney disease, stage 3a (HCC) Assessment & Plan: Nephrology pending   Crohn's disease of large intestine with complication (HCC) Assessment & Plan: Per gi   Essential hypertension Assessment & Plan: Well controlled, no changes to meds. Encouraged heart healthy diet such as the DASH diet and exercise as tolerated.     Rheumatoid arthritis, involving unspecified site, unspecified whether rheumatoid factor present [facility]) Assessment & Plan: Per rheum   Other orders -     Nitroglycerin ; Place 1 tablet (0.4 mg total) under  the tongue every 5 (five) minutes as needed for chest pain. Do not exceed 3 tabs in 15 minutes  Dispense: 75 tablet; Refill: 3  Assessment and Plan Assessment & Plan Hypertensive heart disease with chronic kidney disease   She has hypertensive heart disease with associated chronic kidney disease, recently hospitalized for shortness of breath and fluid overload. Her blood pressure was significantly elevated prior to hospitalization. Current medications include spironolactone  and torsemide  for fluid management. There are concerns about kidney function and a potential need for nephrology consultation. Continue spironolactone  and torsemide , ensure follow-up with nephrology for kidney function assessment, and monitor blood pressure regularly.  Congestive heart failure   She experienced a recent exacerbation due to fluid overload, with symptoms of shortness of breath and peripheral edema, which have improved with diuretic therapy. No current  crackles on lung examination. Monitoring for signs of fluid reaccumulation is essential. Continue the current diuretic regimen with spironolactone  and torsemide , monitor for signs of fluid reaccumulation, and follow up with the cardiologist as scheduled.  Anemia in chronic kidney disease   Anemia is likely secondary to chronic kidney disease. Recent hospitalization included dietary restrictions due to kidney concerns. Improvement in kidney function may lead to improvement in anemia. Continue to monitor hemoglobin levels and anemia status, and encourage dietary modifications as advised by the healthcare team.  General Health Maintenance   She reports dry mouth and dry eyes, likely due to diuretic use, and hearing changes possibly related to fluid changes. Use over-the-counter Systane drops for dry eyes 3-4 times daily, consider biotin for dry mouth, and sour candies to stimulate salivary flow. Check hearing aids for adjustments if hearing changes persist.    Yanelie Abraha R Lowne Chase, DO "

## 2024-09-25 ENCOUNTER — Telehealth: Payer: Self-pay

## 2024-09-25 NOTE — Telephone Encounter (Signed)
 I spoke to patient and assured her that PT/INR were perfectly normal and within limits due to her Warfarin.  She was relieved and will keep f/u INR appt in [facility] 12/9.

## 2024-09-25 NOTE — Telephone Encounter (Signed)
 Lpmtcb regarding INR from 11/26.  Spoke to husband.

## 2024-09-26 ENCOUNTER — Other Ambulatory Visit: Payer: Self-pay

## 2024-09-26 NOTE — Transitions of Care (Post Inpatient/ED Visit) (Signed)
 Transition of Care week 2  Visit Note  09/26/2024  Name: [name] MRN: [id number]          DOB: 03-13-1946  Situation: Patient enrolled in [facility] 30-day program. Visit completed with Arland Quan by telephone.   Background:   Initial Transition Care Management Follow-up Telephone Call Discharge Date and Diagnosis: 09/18/24, CHF exacerbation   Past Medical History:  Diagnosis Date   Allergy    Anxiety    Arthritis    Atrial fibrillation (HCC) 2008   Blood transfusion without reported diagnosis    CAD (coronary artery disease)    Crohn's colitis (HCC)    she reports ulcerative colitis beginning in her 28's, biopsies 2008 suggest Crohn's not UC   Depression    Diverticulosis    GERD (gastroesophageal reflux disease)    Hyperlipidemia    Hypertension    IBS (irritable bowel syndrome)    Keloid of skin    Obesity    OSA (obstructive sleep apnea)    Pancreatitis    Paroxysmal atrial fibrillation (HCC)    Pseudoaneurysm of right femoral artery    Renal oncocytoma    s/p right nephrectomy   Rheumatoid arthritis (HCC) 05/29/2017   Sleep apnea    UC (ulcerative colitis) (HCC)     Assessment: Patient Reported Symptoms: Cognitive Cognitive Status: Alert and oriented to person, place, and time, Normal speech and language skills      Neurological Neurological Review of Symptoms: No symptoms reported    HEENT HEENT Symptoms Reported: Mouth dryness, Eye dryness HEENT Management Strategies: Medication therapy HEENT Comment: Using eye drops and biotin    Cardiovascular Cardiovascular Symptoms Reported: No symptoms reported Does patient have uncontrolled Hypertension?: No Cardiovascular Management Strategies: Medication therapy, Routine screening, Fluid modification, Weight management Do You Have a Working Readable Scale?: Yes Weight: 226 lb (102.5 kg)  Respiratory Respiratory Symptoms Reported: No symptoms reported Respiratory Management Strategies: Oxygen therapy,  Routine screening, Medication therapy  Endocrine Endocrine Symptoms Reported: No symptoms reported Is patient diabetic?: No Endocrine Comment: The patient has a diagnosis of Hyperglycemia but she states she has never been told that and she is not a diabetic  Gastrointestinal Gastrointestinal Symptoms Reported: No symptoms reported Gastrointestinal Comment: The patient has Crohns and states she is trying to maintain a Glutein free diet    Genitourinary Genitourinary Symptoms Reported: Incontinence Genitourinary Management Strategies: Incontinence garment/pad  Integumentary Integumentary Symptoms Reported: No symptoms reported    Musculoskeletal Musculoskelatal Symptoms Reviewed: Weakness Additional Musculoskeletal Details: The patient has RA Musculoskeletal Management Strategies: Activity, Routine screening, Medication therapy, Diet modification Musculoskeletal Comment: HHPT has started and will be coming twice a week      Psychosocial Psychosocial Symptoms Reported: No symptoms reported         Today's Vitals   09/26/24 1132  Weight: 226 lb (102.5 kg)      Medications Reviewed Today     Reviewed by Moises Reusing, RN (Case Manager) on 09/26/24 at 1109  Med List Status: <None>   Medication Order Taking? Sig Documenting Provider Last Dose Status Informant  carvedilol  (COREG ) 25 MG tablet [id number]  Take 1 tablet (25 mg total) by mouth 2 (two) times daily with a meal. Arrien, Elidia Sieving, MD  Active   clobetasol ointment (TEMOVATE) 0.05 % [id number]  Apply 1 Application topically 2 (two) times daily.  Patient taking differently: Apply 1 Application topically as needed.   [provider]  Active Self, Pharmacy Records  dextrose 5 % SOLN 50 mL  with methotrexate 25 MG/ML (PF) SOLN 40 mg/m2 [id number]  Inject 40 mg/m2 into the vein once a week. Every wednesday [provider]  Active Self, Pharmacy Records  dofetilide  (TIKOSYN ) 125 MCG capsule [id number]  Take 2  capsules (250 mcg total) by mouth 2 (two) times daily. Arrien, Elidia Sieving, MD  Active   escitalopram  (LEXAPRO ) 10 MG tablet [id number]  Take 1 tablet (10 mg total) by mouth daily. Domenica Harlene LABOR, MD  Active Self, Pharmacy Records  esomeprazole  (NEXIUM ) 40 MG capsule [id number]  Take 1 capsule (40 mg total) by mouth daily. Take in the morning before breakfast.  Patient taking differently: Take 40 mg by mouth 2 (two) times daily before a meal.   Esterwood, Amy S, PA-C  Active Self, Pharmacy Records  folic acid  (FOLVITE ) 1 MG tablet [id number]  Take 2 mg by mouth daily.  Patient taking differently: Take 1 mg by mouth 2 (two) times daily.   [provider]  Active Self, Pharmacy Records  hydrocortisone  (ANUSOL -HC) 25 MG suppository [id number]  Place 1 suppository (25 mg total) rectally 2 (two) times daily as needed for hemorrhoids or anal itching. Avram Lupita BRAVO, MD  Active Self, Pharmacy Records  inFLIXimab  10 mg/kg in sodium chloride  0.9 % [id number]  Inject 10 mg/kg into the vein every 6 (six) weeks. [provider]  Active Self, Pharmacy Records           Med Note (SATTERFIELD, TEENA BRAVO   Fri Sep 13, 2024  3:57 PM) Next dose was suppose to be on 09-10-24 however pt did not have dose   Methotrexate Sodium (METHOTREXATE, PF,) 50 MG/2ML injection [id number]  Inject 2 mLs into the muscle once a week. [provider]  Active Self, Pharmacy Records           Med Note (SATTERFIELD, TEENA BRAVO   Fri Sep 13, 2024  4:05 PM) Patient and daughter verified she is taking weekly usually on Wednesdays   Multiple Vitamin (MULTIVITAMIN) capsule [id number]  Take 1 capsule by mouth daily. 1 daily [provider]  Active Self, Pharmacy Records  nitroGLYCERIN  (NITROSTAT ) 0.4 MG SL tablet [id number]  Place 1 tablet (0.4 mg total) under the tongue every 5 (five) minutes as needed for chest pain. Do not exceed 3 tabs in 15 minutes Lowne Chase, Yvonne R, [place]  Active   OXYGEN [id number]  Inhale 2  L/min into the lungs at bedtime. [provider]  Active   pantoprazole  (PROTONIX ) 20 MG tablet [id number]  Take 1 tablet (20 mg total) by mouth daily. Antonio Cyndee Jamee JONELLE, DO  Active   Probiotic Product [facility]) CAPS [id number]  Take 1 capsule by mouth daily. 1 per day [provider]  Active Self, Pharmacy Records  spironolactone  (ALDACTONE ) 25 MG tablet [id number]  Take 0.5 tablets (12.5 mg total) by mouth daily. Daneen Damien BROCKS, NP  Active Self, Pharmacy Records  torsemide  (DEMADEX ) 20 MG tablet [id number]  Take 2 tablets (40 mg total) by mouth daily. Arrien, Elidia Sieving, MD  Active   TUBERCULIN SYR 1CC/27GX1/2 27G X 1/2 1 ML MISC [id number]   [provider]  Active Self, Pharmacy Records  valsartan  (DIOVAN ) 160 MG tablet [id number]  TAKE 1 AND 1/2 TABLETS ONCE DAILY  Patient taking differently: Take 160 mg by mouth 2 (two) times daily. TAKE 1 TWICE A DAY   Crenshaw, Redell RAMAN, MD  Active Self, Pharmacy Records  warfarin (COUMADIN ) 2.5 MG tablet [id number]  TAKE 1/2 TABLET TO  1 TABLET BY MOUTH DAILY AS DIRECTED BY COUMADIN  CLINIC  Patient taking differently: Take 1.25-2.5 mg by mouth See admin instructions. TAKE 1/2 TABLET TO 1 TABLET BY MOUTH ON SUNDAYS, TUESDAYS AND THURSDAYS, THEN TAKE WHOLE (2.5 MG) TABLET REST OF DAYS   Crenshaw, Redell RAMAN, MD  Active Self, Pharmacy Records            Recommendation:   Continue Current Plan of Care  Follow Up Plan:   Telephone follow-up in 1 week  Medford Balboa, BSN, RN [place]  VBCI - [facility] RN Care Manager [phone]

## 2024-09-26 NOTE — Patient Instructions (Signed)
  Visit Information  Thank you for taking time to visit with me today. Please don't hesitate to contact me if I can be of assistance to you before our next scheduled telephone appointment.  Our next appointment is by telephone on Thursday December 11th at 11:00am  Following is a copy of your care plan:   Goals Addressed             This Visit's Progress    VBCI Transitions of Care (TOC) Care Plan       Problems: (reviewed 09/26/24) Recent Hospitalization for treatment of CHF Hospital or ED Adm Risk 57%  Goal: (reviewed 09/26/24) Over the next 30 days, the patient will not experience hospital readmission  Interventions: (reviewed 09/26/24)  Heart Failure Interventions: Basic overview and discussion of pathophysiology of Heart Failure reviewed Provided education on low sodium diet Assessed need for readable accurate scales in home Advised patient to weigh each morning after emptying bladder Discussed importance of daily weight and advised patient to weigh and record daily Reviewed role of diuretics in prevention of fluid overload and management of heart failure; Discussed the importance of keeping all appointments with provider Provided patient with education about the role of exercise in the management of heart failure Take Spirnolactone and Torsemide  daily as directed O2 at night at 2l/min as needed  Patient Self Care Activities: (reviewed 09/26/24) Attend all scheduled provider appointments Attend church or other social activities Call provider office for new concerns or questions  Notify RN Care Manager of TOC call rescheduling needs Participate in Transition of Care Program/Attend TOC scheduled calls Perform all self care activities independently  Take medications as prescribed   The patient has A-fib and is on Coumadin  - Take Warfarin as directed and follow up with Coumadin  clinic for PT/INR  Plan:  Telephone follow up appointment with care management team member  scheduled for:  Thursday December 11th at 11:00am        Patient verbalizes understanding of instructions and care plan provided today and agrees to view in MyChart. Active MyChart status and patient understanding of how to access instructions and care plan via MyChart confirmed with patient.     The patient has been provided with contact information for the care management team and has been advised to call with any health related questions or concerns.   Please call the care guide team at [phone] if you need to cancel or reschedule your appointment.   Please call the Suicide and Crisis Lifeline: 988 call the [place]  National Suicide Prevention Lifeline: 216-784-8946 or TTY: (737)401-5017 TTY 469 679 9368) to talk to a trained counselor if you are experiencing a Mental Health or Behavioral Health Crisis or need someone to talk to.  Medford Balboa, BSN, RN [place]  VBCI - Population Health RN Care Manager [phone]    I couldn't find anything in our resources but I looked on-line and found this:  Anti-inflammatory foods An anti-inflammatory diet should include these foods:  tomatoes olive oil green leafy vegetables, such as spinach, kale, and collards nuts like almonds and walnuts fatty fish like salmon, mackerel, tuna, and sardines fruits such as strawberries, blueberries, cherries, and oranges

## 2024-09-27 ENCOUNTER — Encounter: Payer: Self-pay | Admitting: Cardiology

## 2024-09-27 ENCOUNTER — Ambulatory Visit: Admitting: Cardiology

## 2024-09-27 DIAGNOSIS — I5033 Acute on chronic diastolic (congestive) heart failure: Secondary | ICD-10-CM | POA: Diagnosis not present

## 2024-09-27 DIAGNOSIS — Z85828 Personal history of other malignant neoplasm of skin: Secondary | ICD-10-CM | POA: Diagnosis not present

## 2024-09-27 DIAGNOSIS — M0589 Other rheumatoid arthritis with rheumatoid factor of multiple sites: Secondary | ICD-10-CM | POA: Diagnosis not present

## 2024-09-27 DIAGNOSIS — K5090 Crohn's disease, unspecified, without complications: Secondary | ICD-10-CM | POA: Diagnosis not present

## 2024-09-27 DIAGNOSIS — L409 Psoriasis, unspecified: Secondary | ICD-10-CM | POA: Diagnosis not present

## 2024-09-27 DIAGNOSIS — Q600 Renal agenesis, unilateral: Secondary | ICD-10-CM | POA: Diagnosis not present

## 2024-09-27 DIAGNOSIS — H209 Unspecified iridocyclitis: Secondary | ICD-10-CM | POA: Diagnosis not present

## 2024-09-27 DIAGNOSIS — C4492 Squamous cell carcinoma of skin, unspecified: Secondary | ICD-10-CM | POA: Diagnosis not present

## 2024-09-27 DIAGNOSIS — Z6841 Body Mass Index (BMI) 40.0 and over, adult: Secondary | ICD-10-CM | POA: Diagnosis not present

## 2024-09-30 ENCOUNTER — Ambulatory Visit: Payer: Self-pay | Admitting: Student

## 2024-09-30 ENCOUNTER — Inpatient Hospital Stay (INDEPENDENT_AMBULATORY_CARE_PROVIDER_SITE_OTHER): Admission: RE | Admit: 2024-09-30 | Discharge: 2024-09-30 | Attending: Student | Admitting: Radiology

## 2024-09-30 DIAGNOSIS — I1A0 Resistant hypertension: Secondary | ICD-10-CM | POA: Diagnosis not present

## 2024-09-30 DIAGNOSIS — I10 Essential (primary) hypertension: Secondary | ICD-10-CM | POA: Diagnosis not present

## 2024-09-30 DIAGNOSIS — I159 Secondary hypertension, unspecified: Secondary | ICD-10-CM

## 2024-09-30 DIAGNOSIS — Z905 Acquired absence of kidney: Secondary | ICD-10-CM | POA: Diagnosis not present

## 2024-09-30 DIAGNOSIS — I169 Hypertensive crisis, unspecified: Secondary | ICD-10-CM | POA: Diagnosis not present

## 2024-09-30 DIAGNOSIS — I129 Hypertensive chronic kidney disease with stage 1 through stage 4 chronic kidney disease, or unspecified chronic kidney disease: Secondary | ICD-10-CM

## 2024-09-30 NOTE — Assessment & Plan Note (Signed)
 Per endo

## 2024-10-01 ENCOUNTER — Encounter: Payer: Self-pay | Admitting: Student

## 2024-10-01 ENCOUNTER — Ambulatory Visit: Attending: Cardiology

## 2024-10-01 ENCOUNTER — Encounter: Payer: Self-pay | Admitting: Cardiology

## 2024-10-01 DIAGNOSIS — Z5181 Encounter for therapeutic drug level monitoring: Secondary | ICD-10-CM

## 2024-10-01 DIAGNOSIS — I4891 Unspecified atrial fibrillation: Secondary | ICD-10-CM

## 2024-10-01 LAB — POCT INR: INR: 3.2 — AB (ref 2.0–3.0)

## 2024-10-01 NOTE — Patient Instructions (Signed)
 Continue taking  2.5mg  daily EXCEPT 1.25mg  on Sunday, Tuesdays, and Thursdays.  Eat greens tonight. Stay consistent with green each week  (2 per week)  Repeat INR in 6 weeks.  Coumadin  Clinic [phone]

## 2024-10-02 ENCOUNTER — Ambulatory Visit: Admitting: Podiatry

## 2024-10-02 DIAGNOSIS — B351 Tinea unguium: Secondary | ICD-10-CM

## 2024-10-02 DIAGNOSIS — M79674 Pain in right toe(s): Secondary | ICD-10-CM | POA: Diagnosis not present

## 2024-10-02 DIAGNOSIS — M79675 Pain in left toe(s): Secondary | ICD-10-CM

## 2024-10-02 MED ORDER — CARVEDILOL 25 MG PO TABS: 12.5000 mg | ORAL_TABLET | Freq: Two times a day (BID) | ORAL | Status: DC

## 2024-10-02 NOTE — Progress Notes (Signed)
 Subjective:  Patient ID: [name], female    DOB: [birth year],  MRN: [id number]  [name] presents to clinic today for:  Chief Complaint  Patient presents with   RFC    RFC no calluses. Not diabetic.    Patient notes nails are thick, discolored, elongated and painful in shoegear when trying to ambulate.  No issues in the hospital in November with congestive heart failure caused by combination of 2 prescription medications that she takes.  She states that she was aware this was a known side effect/complication.  PCP is Domenica Harlene LABOR, MD. last seen 06/10/2024  Past Medical History:  Diagnosis Date   Allergy    Anxiety    Arthritis    Atrial fibrillation (HCC) 2008   Blood transfusion without reported diagnosis    CAD (coronary artery disease)    Crohn's colitis (HCC)    she reports ulcerative colitis beginning in her 9's, biopsies 2008 suggest Crohn's not UC   Depression    Diverticulosis    GERD (gastroesophageal reflux disease)    Hyperlipidemia    Hypertension    IBS (irritable bowel syndrome)    Keloid of skin    Obesity    OSA (obstructive sleep apnea)    Pancreatitis    Paroxysmal atrial fibrillation (HCC)    Pseudoaneurysm of right femoral artery    Renal oncocytoma    s/p right nephrectomy   Rheumatoid arthritis (HCC) 05/29/2017   Sleep apnea    UC (ulcerative colitis) (HCC)    Past Surgical History:  Procedure Laterality Date   ANGIOPLASTY     BREAST BIOPSY  10/24/1994   benign lesion    Cataract surgery Left 11/13/2014   CHOLECYSTECTOMY  10/24/2006   COLONOSCOPY W/ BIOPSIES     CORONARY ANGIOPLASTY     ESOPHAGOGASTRODUODENOSCOPY     EYE SURGERY     NEPHRECTOMY  10/24/2006   right secondary to oncocytoma    RIGHT FEMORAL  PSEUDOANEURYSM & RIGHT GROIN HEMATOMA EVACUATION  02/22/2007   RIGHT RENAL ARTERY REPAIR  10/24/1974   TUBAL LIGATION     Allergies  Allergen Reactions   Hydralazine  Diarrhea and Nausea Only    Anorexia,  upset stomach, burning pain in abdomin   Cefdinir      Pt cannot take; may interfere with AFib.   Codeine     Heart beats fast    Fosamax [Alendronate Sodium] Other (See Comments)    GI upset   Gluten Meal    Guaifenesin Er Nausea And Vomiting and Other (See Comments)    Headaches; can tolerate liquid.   Latex Other (See Comments)    Breathing problems   Percocet [Oxycodone -Acetaminophen ]     Itching & swelling in jaw Pt said she can take tylenol    Psyllium Other (See Comments)    Review of Systems: Negative except as noted in the HPI.  Objective:  [name] is a pleasant 78 y.o. female in NAD. AAO x 3.  Vascular Examination: Capillary refill time is 3-5 seconds to toes bilateral. Palpable pedal pulses b/l LE. Digital hair present b/l.  Skin temperature gradient WNL b/l. No varicosities b/l. No cyanosis noted b/l.   Dermatological Examination: Pedal skin with normal turgor, texture and tone b/l. No open wounds. No interdigital macerations b/l. Toenails x10 are 3mm thick, discolored, dystrophic with subungual debris. There is pain with compression of the nail plates.  They are elongated x10      Latest Ref Rng &  Units 06/10/2024   11:27 AM 01/18/2024   11:24 AM  Hemoglobin A1C  Hemoglobin-A1c 4.6 - 6.5 % 5.8  5.8    Assessment/Plan: 1. Pain due to onychomycosis of toenails of both feet    The mycotic toenails were sharply debrided x10 with sterile nail nippers and a power debriding burr to decrease bulk/thickness and length.     F/u 3 months   Tula Schryver DSABRA [place], DPM, FACFAS [facility]     [address] [place], [place] [address]                Office [phone]  Fax [phone]

## 2024-10-03 ENCOUNTER — Other Ambulatory Visit: Payer: Self-pay

## 2024-10-03 NOTE — Patient Instructions (Signed)
 Visit Information  Thank you for taking time to visit with me today. Please don't hesitate to contact me if I can be of assistance to you before our next scheduled telephone appointment.  Our next appointment is by telephone on Thursday December 18th at 11:00AM  Following is a copy of your care plan:   Goals Addressed             This Visit's Progress    VBCI Transitions of Care (TOC) Care Plan       Problems: (reviewed 10/03/24) Recent Hospitalization for treatment of CHF Hospital or ED Adm Risk 57%  Goal: (reviewed 10/03/24) Over the next 30 days, the patient will not experience hospital readmission  Interventions: (reviewed 10/03/24)  Heart Failure Interventions: Basic overview and discussion of pathophysiology of Heart Failure reviewed Provided education on low sodium diet Assessed need for readable accurate scales in home Advised patient to weigh each morning after emptying bladder Discussed importance of daily weight and advised patient to weigh and record daily Reviewed role of diuretics in prevention of fluid overload and management of heart failure; Discussed the importance of keeping all appointments with provider Provided patient with education about the role of exercise in the management of heart failure Take Spirnolactone and Torsemide  daily as directed O2 at night at 2l/min as needed HHRN weekly to monitor BP  Patient Self Care Activities: (reviewed 10/04/24) Attend all scheduled provider appointments Attend church or other social activities Call provider office for new concerns or questions  Notify RN Care Manager of TOC call rescheduling needs Participate in Transition of Care Program/Attend TOC scheduled calls Perform all self care activities independently  Take medications as prescribed   The patient has A-fib and is on Coumadin  - Take Warfarin as directed and follow up with Coumadin  clinic for PT/INR  Plan:  Telephone follow up appointment with care  management team member scheduled for:  Thursday December 18th at 11:00am        Patient verbalizes understanding of instructions and care plan provided today and agrees to view in MyChart. Active MyChart status and patient understanding of how to access instructions and care plan via MyChart confirmed with patient.     The patient has been provided with contact information for the care management team and has been advised to call with any health related questions or concerns.   Please call the care guide team at [phone] if you need to cancel or reschedule your appointment.   Please call the Suicide and Crisis Lifeline: 988 call the [place]  National Suicide Prevention Lifeline: (719)441-8135 or TTY: 860 445 1314 TTY 765-429-6705) to talk to a trained counselor if you are experiencing a Mental Health or Behavioral Health Crisis or need someone to talk to.  Medford Balboa, BSN, RN [place]  VBCI - [employer] [facility] RN Care Manager [phone]

## 2024-10-03 NOTE — Transitions of Care (Post Inpatient/ED Visit) (Signed)
 Transition of Care week 3  Visit Note  10/03/2024  Name: [name] MRN: [id number]          DOB: September 24, 1946  Situation: Patient enrolled in [facility] 30-day program. Visit completed with Arland Quan by telephone.   Background:   Initial Transition Care Management Follow-up Telephone Call Discharge Date and Diagnosis: 09/18/24, CHF exacerbation   Past Medical History:  Diagnosis Date   Allergy    Anxiety    Arthritis    Atrial fibrillation (HCC) 2008   Blood transfusion without reported diagnosis    CAD (coronary artery disease)    Crohn's colitis (HCC)    she reports ulcerative colitis beginning in her 76's, biopsies 2008 suggest Crohn's not UC   Depression    Diverticulosis    GERD (gastroesophageal reflux disease)    Hyperlipidemia    Hypertension    IBS (irritable bowel syndrome)    Keloid of skin    Obesity    OSA (obstructive sleep apnea)    Pancreatitis    Paroxysmal atrial fibrillation (HCC)    Pseudoaneurysm of right femoral artery    Renal oncocytoma    s/p right nephrectomy   Rheumatoid arthritis (HCC) 05/29/2017   Sleep apnea    UC (ulcerative colitis) (HCC)     Assessment: Patient Reported Symptoms: Cognitive Cognitive Status: Alert and oriented to person, place, and time, Normal speech and language skills      Neurological Neurological Review of Symptoms: No symptoms reported    HEENT HEENT Symptoms Reported: Mouth dryness HEENT Management Strategies: Medication therapy    Cardiovascular Cardiovascular Symptoms Reported: No symptoms reported Does patient have uncontrolled Hypertension?: No Cardiovascular Management Strategies: Fluid modification, Medication therapy, Routine screening, Coping strategies, Activity Do You Have a Working Readable Scale?: Yes Weight: 225 lb (102.1 kg) Cardiovascular Comment: Her pulse rate was running low and she had her carvedilol  cut in half  Respiratory Respiratory Symptoms Reported: No symptoms reported     Endocrine Endocrine Symptoms Reported: No symptoms reported Is patient diabetic?: No    Gastrointestinal Gastrointestinal Symptoms Reported: Abdominal pain or discomfort Gastrointestinal Comment: The patient has Crohns    Genitourinary Genitourinary Symptoms Reported: Incontinence Genitourinary Management Strategies: Incontinence garment/pad  Integumentary Integumentary Symptoms Reported: No symptoms reported    Musculoskeletal Musculoskelatal Symptoms Reviewed: Weakness Musculoskeletal Comment: HHPT      Psychosocial Psychosocial Symptoms Reported: No symptoms reported         Today's Vitals   10/03/24 1125  Weight: 225 lb (102.1 kg)      Medications Reviewed Today     Reviewed by Moises Reusing, RN (Case Manager) on 10/03/24 at 1122  Med List Status: <None>   Medication Order Taking? Sig Documenting Provider Last Dose Status Informant  carvedilol  (COREG ) 25 MG tablet [id number]  Take 0.5 tablets (12.5 mg total) by mouth 2 (two) times daily with a meal. Pietro, Redell GORMAN, MD  Active   clobetasol ointment (TEMOVATE) 0.05 % [id number] Yes Apply 1 Application topically 2 (two) times daily.  Patient taking differently: Apply 1 Application topically as needed.   [provider]  Active Self, Pharmacy Records  dextrose 5 % SOLN 50 mL with methotrexate 25 MG/ML (PF) SOLN 40 mg/m2 [id number] Yes Inject 40 mg/m2 into the vein once a week. Every wednesday [provider]  Active Self, Pharmacy Records  dofetilide  (TIKOSYN ) 125 MCG capsule [id number] Yes Take 2 capsules (250 mcg total) by mouth 2 (two) times daily. Arrien, Elidia Sieving, MD  Active  escitalopram  (LEXAPRO ) 10 MG tablet [id number] Yes Take 1 tablet (10 mg total) by mouth daily. Domenica Harlene LABOR, MD  Active Self, Pharmacy Records  esomeprazole  (NEXIUM ) 40 MG capsule [id number] Yes Take 1 capsule (40 mg total) by mouth daily. Take in the morning before breakfast.  Patient taking differently: Take 40 mg by  mouth 2 (two) times daily before a meal.   Esterwood, Amy S, PA-C  Active Self, Pharmacy Records  folic acid  (FOLVITE ) 1 MG tablet [id number] Yes Take 2 mg by mouth daily.  Patient taking differently: Take 1 mg by mouth 2 (two) times daily.   [provider]  Active Self, Pharmacy Records  hydrocortisone  (ANUSOL -HC) 25 MG suppository [id number] Yes Place 1 suppository (25 mg total) rectally 2 (two) times daily as needed for hemorrhoids or anal itching. Avram Lupita BRAVO, MD  Active Self, Pharmacy Records  Methotrexate Sodium (METHOTREXATE, PF,) 50 MG/2ML injection [id number] Yes Inject 2 mLs into the muscle once a week. [provider]  Active Self, Pharmacy Records           Med Note (SATTERFIELD, TEENA BRAVO   Fri Sep 13, 2024  4:05 PM) Patient and daughter verified she is taking weekly usually on Wednesdays   Multiple Vitamin (MULTIVITAMIN) capsule [id number] Yes Take 1 capsule by mouth daily. 1 daily [provider]  Active Self, Pharmacy Records  nitroGLYCERIN  (NITROSTAT ) 0.4 MG SL tablet [id number] Yes Place 1 tablet (0.4 mg total) under the tongue every 5 (five) minutes as needed for chest pain. Do not exceed 3 tabs in 15 minutes Lowne Chase, Yvonne R, [place]  Active   OXYGEN [id number] Yes Inhale 2 L/min into the lungs at bedtime. [provider]  Active   pantoprazole  (PROTONIX ) 20 MG tablet [id number] Yes Take 1 tablet (20 mg total) by mouth daily. Antonio Cyndee Jamee JONELLE, DO  Active   Probiotic Product [facility]ON [facility]) CAPS [id number] Yes Take 1 capsule by mouth daily. 1 per day [provider]  Active Self, Pharmacy Records  spironolactone  (ALDACTONE ) 25 MG tablet [id number] Yes Take 0.5 tablets (12.5 mg total) by mouth daily. Daneen Damien BROCKS, NP  Active Self, Pharmacy Records  torsemide  (DEMADEX ) 20 MG tablet [id number] Yes Take 2 tablets (40 mg total) by mouth daily. Arrien, Elidia Sieving, MD  Active   TUBERCULIN SYR 1CC/27GX1/2 27G X 1/2 1 ML MISC  [id number] Yes  [provider]  Active Self, Pharmacy Records  valsartan  (DIOVAN ) 160 MG tablet [id number] Yes TAKE 1 AND 1/2 TABLETS ONCE DAILY  Patient taking differently: Take 160 mg by mouth 2 (two) times daily. TAKE 1 TWICE A DAY   Crenshaw, Redell RAMAN, MD  Active Self, Pharmacy Records  warfarin (COUMADIN ) 2.5 MG tablet [id number] Yes TAKE 1/2 TABLET TO 1 TABLET BY MOUTH DAILY AS DIRECTED BY COUMADIN  CLINIC  Patient taking differently: Take 1.25-2.5 mg by mouth See admin instructions. TAKE 1/2 TABLET TO 1 TABLET BY MOUTH ON SUNDAYS, TUESDAYS AND THURSDAYS, THEN TAKE WHOLE (2.5 MG) TABLET REST OF DAYS   Crenshaw, Redell RAMAN, MD  Active Self, Pharmacy Records            Recommendation:   Continue Current Plan of Care  Follow Up Plan:   Telephone follow-up in 1 week  Medford Balboa, BSN, RN [place]  VBCI - [facility] RN Care Manager [phone]

## 2024-10-07 ENCOUNTER — Encounter (HOSPITAL_BASED_OUTPATIENT_CLINIC_OR_DEPARTMENT_OTHER): Admitting: Student

## 2024-10-07 ENCOUNTER — Telehealth: Payer: Self-pay | Admitting: Cardiology

## 2024-10-07 ENCOUNTER — Encounter: Payer: Self-pay | Admitting: Cardiology

## 2024-10-07 NOTE — Telephone Encounter (Signed)
 STAT if HR is under 50 or over 120  (normal HR is 60-100 beats per minute)  What is your heart rate? 45-50 hr   Do you have a log of your heart rate readings (document readings)? No   Do you have any other symptoms? No  April with [facility] calling in again. They have not heard anything about the low heart rate. Please advise.

## 2024-10-07 NOTE — Telephone Encounter (Signed)
 Addressed in MyChart encounter thread.

## 2024-10-09 ENCOUNTER — Ambulatory Visit (INDEPENDENT_AMBULATORY_CARE_PROVIDER_SITE_OTHER)
Admission: RE | Admit: 2024-10-09 | Discharge: 2024-10-09 | Disposition: A | Discharge status: Home / Self Care | Source: Ambulatory Visit | Attending: Family Medicine | Admitting: Family Medicine

## 2024-10-09 ENCOUNTER — Ambulatory Visit: Payer: Self-pay | Admitting: Family Medicine

## 2024-10-09 ENCOUNTER — Inpatient Hospital Stay (HOSPITAL_BASED_OUTPATIENT_CLINIC_OR_DEPARTMENT_OTHER): Admission: RE | Admit: 2024-10-09 | Discharge: 2024-10-09 | Attending: Family Medicine | Admitting: Family Medicine

## 2024-10-09 DIAGNOSIS — T7840XA Allergy, unspecified, initial encounter: Secondary | ICD-10-CM | POA: Insufficient documentation

## 2024-10-09 DIAGNOSIS — K5790 Diverticulosis of intestine, part unspecified, without perforation or abscess without bleeding: Secondary | ICD-10-CM | POA: Insufficient documentation

## 2024-10-09 DIAGNOSIS — Z1231 Encounter for screening mammogram for malignant neoplasm of breast: Secondary | ICD-10-CM

## 2024-10-09 DIAGNOSIS — Z780 Asymptomatic menopausal state: Secondary | ICD-10-CM

## 2024-10-09 DIAGNOSIS — K5190 Ulcerative colitis, unspecified, without complications: Secondary | ICD-10-CM | POA: Insufficient documentation

## 2024-10-09 LAB — MISCELLANEOUS TEST

## 2024-10-10 ENCOUNTER — Other Ambulatory Visit: Payer: Self-pay

## 2024-10-10 NOTE — Patient Instructions (Signed)
 Visit Information  Thank you for taking time to visit with me today. Please don't hesitate to contact me if I can be of assistance to you before our next scheduled telephone appointment.  Our next appointment is by telephone on Monday December 29th at 10:00am  Following is a copy of your care plan:   Goals Addressed             This Visit's Progress    VBCI Transitions of Care (TOC) Care Plan       Problems: (reviewed 10/10/24) Recent Hospitalization for treatment of CHF Hospital or ED Adm Risk 57%  Goal: (reviewed 10/10/24) Over the next 30 days, the patient will not experience hospital readmission  Interventions: (reviewed 10/10/24)  Heart Failure Interventions: Basic overview and discussion of pathophysiology of Heart Failure reviewed Provided education on low sodium diet Assessed need for readable accurate scales in home Advised patient to weigh each morning after emptying bladder Discussed importance of daily weight and advised patient to weigh and record daily Reviewed role of diuretics in prevention of fluid overload and management of heart failure; Discussed the importance of keeping all appointments with provider Provided patient with education about the role of exercise in the management of heart failure Take Spirnolactone and Torsemide  daily as directed O2 at night at 2l/min as needed HHRN weekly to monitor BP 12/18 - Carvedilol  is now 6.25mg  twice a daily to maintain HR above 50  Patient Self Care Activities: (reviewed 10/10/24) Attend all scheduled provider appointments Attend church or other social activities Call provider office for new concerns or questions  Notify RN Care Manager of TOC call rescheduling needs Participate in Transition of Care Program/Attend [facility] scheduled calls Perform all self care activities independently  Take medications as prescribed   The patient has A-fib and is on Coumadin  - Take Warfarin as directed and follow up with Coumadin   clinic for PT/INR  Plan:  Telephone follow up appointment with care management team member scheduled for:  Monday December 29th at 10:00am        Patient verbalizes understanding of instructions and care plan provided today and agrees to view in MyChart. Active MyChart status and patient understanding of how to access instructions and care plan via MyChart confirmed with patient.     The patient has been provided with contact information for the care management team and has been advised to call with any health related questions or concerns.   Please call the care guide team at [phone] if you need to cancel or reschedule your appointment.   Please call the Suicide and Crisis Lifeline: 988 call the [place]  National Suicide Prevention Lifeline: (629) 229-4562 or TTY: (903) 717-9404 TTY [phone]) to talk to a trained counselor if you are experiencing a Mental Health or Behavioral Health Crisis or need someone to talk to.  Medford Balboa, BSN, RN [place]  VBCI - [employer] [facility] RN Care Manager [phone]

## 2024-10-10 NOTE — Transitions of Care (Post Inpatient/ED Visit) (Signed)
 Transition of Care week 4  Visit Note  10/10/2024  Name: [name] MRN: [id number]          DOB: April 08, 1946  Situation: Patient enrolled in [facility] 30-day program. Visit completed with Arland Quan by telephone.   Background:   Initial Transition Care Management Follow-up Telephone Call Discharge Date and Diagnosis: 09/18/24, CHF exacerbation   Past Medical History:  Diagnosis Date   Abnormal gait 08/18/2021   Acute midline thoracic back pain 09/07/2023   Acute on chronic diastolic CHF (congestive heart failure) (HCC) 09/13/2024   Acute respiratory failure with hypoxia (HCC) 09/12/2024   Afib (HCC) 01/15/2009   Qualifier: History of   By: Georgian ROSALEA CHARM [place]        Allergic rhinitis 09/06/2007   Qualifier: Diagnosis of   By: Georgian ROSALEA CHARM [place]     IMO SNOMED Dx Update Oct 2024     Allergy    Anemia 06/09/2024   Anxiety    Arthritis    Basal cell carcinoma 02/07/2012   Follows with [facility], Dr Dale     Bilateral leg edema 09/12/2012   Blepharitis 11/17/2019   Blood transfusion without reported diagnosis    CAD (coronary artery disease)    Cerebrovascular disease 07/30/2014   Chest pain 09/11/2023   Chronic kidney disease, stage 3a (HCC) 09/18/2024   Coronary atherosclerosis 09/06/2007   Centricity Description: CORONARY ARTERY DISEASE  Qualifier: Diagnosis of   By: Anice CMA, Darlene      Centricity Description: CAD, UNSPECIFIED SITE  Qualifier: Diagnosis of   By: Pietro, MD, CODY Redell Dimes        COVID-19 08/28/2023   Crohn's colitis (HCC)    she reports ulcerative colitis beginning in her 57's, biopsies 2008 suggest Crohn's not UC   Crohn's disease (HCC) 08/18/2021   Depression    DEPRESSION/ANXIETY 09/06/2007   Qualifier: Diagnosis of   By: Anice CMA, Darlene         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   Qualifier: Diagnosis of   By: Georgian ROSALEA CHARM [place]  [phone]  10/2006 had a right kidney removed for a possible infection?  Lesion?  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   Headache 02/24/2009   Qualifier: Diagnosis of   By: Georgian ROSALEA CHARM [place]     IMO SNOMED Dx Update Oct 2024     Hearing loss 09/06/2007   Qualifier: Diagnosis of   By: Anice CMA, Darlene         Hip pain, chronic, left 12/03/2018   History of colonic polyps 07/04/2007   2 DIMINUTIVE SIGMOID ADENOMAS 9/08  08/2012 - 3 diminutive adenomas     IMO SNOMED Dx Update Oct 2024     History of malignant neoplasm of skin 08/18/2021   Hyperglycemia 07/02/2009   Qualifier: Diagnosis of   By: Georgian ROSALEA CHARM [place]        Hyperlipidemia    Hypertension    Hypertensive urgency 09/13/2024   IBS (irritable bowel syndrome)    Intertrigo 11/12/2013   Keloid of skin    Low back pain 01/07/2009   Qualifier: Diagnosis of  By: Avram MD, NOLIA Pitts E        Maxillary sinus cyst 02/07/2012   Medicare annual wellness visit, subsequent 08/17/2012   Sees Dr Pietro of cardiology  Sees [facility] pul[facility]ology  Sees Dr Dale of [facility]  Sees Dr Avram, [facility], last colonoscopy 09/10/12, will have repeat colonoscopy soon     Morbid obesity (HCC) 09/25/2023   MYOCARDIAL INFARCTION, HX OF 09/06/2007   Qualifier: Diagnosis of   By: Anice CMA, Darlene         Obesity, class 3 (HCC) 01/03/2008   Qualifier: Diagnosis of   By: Derrek Ramp         Obesity, Class III, BMI 40-49.9 (morbid obesity) (HCC) 09/13/2024   Onychomycosis 06/03/2018   OSA (obstructive sleep apnea)    Osteoporosis 11/05/2007            Other acne 04/21/2014   Other specified disorders of kidney and ureter 09/07/2023   Pain in joint of right shoulder 04/21/2014   Pancreatitis    Paroxysmal atrial  fibrillation (HCC)    Pseudoaneurysm of right femoral artery    Psoriasis 08/04/2021   Radicular low back pain 06/28/2012   L1-2: No impingement. Mild disc bulge.   L2-3: Borderline right foraminal stenosis due to disc bulge.   L3-4: Mild left foraminal stenosis due to disc bulge,   intervertebral spurring, and facet arthropathy.   L4-5: Mild right and borderline left foraminal stenosis with mild   bilateral subarticular lateral recess stenosis and borderline   central stenosis due to facet arthropathy and disc bulge.    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   Shoulder pain 06/09/2024   Sinusitis 06/30/2014   Sleep apnea    Statin intolerance 02/28/2011   Type 1 diabetes mellitus with hyperglycemia (HCC) 09/24/2024   UC (ulcerative colitis) (HCC)    Uncontrolled hypertension 09/10/2024   Uveitis 08/18/2021   Vitamin D  deficiency 08/04/2021    Assessment: Patient Reported Symptoms: Cognitive Cognitive Status: Alert and oriented to person, place, and time, Normal speech and language skills      Neurological Neurological Review of Symptoms: No symptoms reported    HEENT HEENT Symptoms Reported: Mouth dryness HEENT Management Strategies: Medication therapy, Routine screening    Cardiovascular Cardiovascular Symptoms Reported: Lightheadness, Fatigue Does patient have uncontrolled Hypertension?: No Cardiovascular Management Strategies: Routine screening, Medication therapy, Coping strategies, Activity Do You Have a Working Readable Scale?: Yes Weight: 225 lb (102.1 kg) Cardiovascular Comment: The patient's HR continued to run low and her Carvedilol  is now 1/4 tab of a 25mg  so she is taking 6.25 twice a day  Respiratory Respiratory Symptoms Reported: No symptoms reported Additional Respiratory Details: O2 at night Respiratory Management Strategies:  Oxygen therapy, Routine screening, Medication therapy  Endocrine Endocrine Symptoms Reported: No symptoms reported Is patient diabetic?: No    Gastrointestinal Gastrointestinal Symptoms Reported: No symptoms reported Gastrointestinal Comment: History of Crohns    Genitourinary Genitourinary Symptoms Reported: Incontinence Genitourinary Management Strategies: Incontinence garment/pad  Integumentary Integumentary Symptoms Reported: No symptoms reported    Musculoskeletal Musculoskelatal Symptoms Reviewed: Weakness, Joint pain Additional Musculoskeletal Details: RA Musculoskeletal Management Strategies: Activity, Routine screening, Medication therapy Musculoskeletal Comment: HHPT      Psychosocial Psychosocial Symptoms Reported: No symptoms reported         Today's Vitals   10/10/24 1120  Weight: 225 lb (102.1  kg)      Medications Reviewed Today     Reviewed by Moises Reusing, RN (Case Manager) on 10/10/24 at 1114  Med List Status: <None>   Medication Order Taking? Sig Documenting Provider Last Dose Status Informant  carvedilol  (COREG ) 25 MG tablet [id number] Yes Take 0.5 tablets (12.5 mg total) by mouth 2 (two) times daily with a meal.  Patient taking differently: Take 6.25 mg by mouth 2 (two) times daily with a meal. 12/18 - The patients dose is now 6.25mg  twice daily   Pietro Redell RAMAN, MD  Active   clobetasol ointment (TEMOVATE) 0.05 % [id number]  Apply 1 Application topically 2 (two) times daily.  Patient taking differently: Apply 1 Application topically as needed.   [provider]  Active Self, Pharmacy Records  dextrose 5 % SOLN 50 mL with methotrexate 25 MG/ML (PF) SOLN 40 mg/m2 [id number]  Inject 40 mg/m2 into the vein once a week. Every wednesday [provider]  Active Self, Pharmacy Records  dofetilide  (TIKOSYN ) 125 MCG capsule [id number]  Take 2 capsules (250 mcg total) by mouth 2 (two) times daily. Arrien, Elidia Sieving, MD  Active    escitalopram  (LEXAPRO ) 10 MG tablet [id number]  Take 1 tablet (10 mg total) by mouth daily. Domenica Harlene LABOR, MD  Active Self, Pharmacy Records  esomeprazole  (NEXIUM ) 40 MG capsule [id number]  Take 1 capsule (40 mg total) by mouth daily. Take in the morning before breakfast.  Patient taking differently: Take 40 mg by mouth 2 (two) times daily before a meal.   Esterwood, Amy S, PA-C  Active Self, Pharmacy Records  folic acid  (FOLVITE ) 1 MG tablet [id number]  Take 2 mg by mouth daily.  Patient taking differently: Take 1 mg by mouth 2 (two) times daily.   [provider]  Active Self, Pharmacy Records  hydrocortisone  (ANUSOL -HC) 25 MG suppository [id number]  Place 1 suppository (25 mg total) rectally 2 (two) times daily as needed for hemorrhoids or anal itching. Avram Lupita BRAVO, MD  Active Self, Pharmacy Records  Methotrexate Sodium (METHOTREXATE, PF,) 50 MG/2ML injection [id number]  Inject 2 mLs into the muscle once a week. [provider]  Active Self, Pharmacy Records           Med Note (SATTERFIELD, TEENA BRAVO   Fri Sep 13, 2024  4:05 PM) Patient and daughter verified she is taking weekly usually on Wednesdays   Multiple Vitamin (MULTIVITAMIN) capsule [id number]  Take 1 capsule by mouth daily. 1 daily [provider]  Active Self, Pharmacy Records  nitroGLYCERIN  (NITROSTAT ) 0.4 MG SL tablet [id number]  Place 1 tablet (0.4 mg total) under the tongue every 5 (five) minutes as needed for chest pain. Do not exceed 3 tabs in 15 minutes Lowne Chase, Yvonne R, [place]  Active   OXYGEN [id number]  Inhale 2 L/min into the lungs at bedtime. [provider]  Active   pantoprazole  (PROTONIX ) 20 MG tablet [id number]  Take 1 tablet (20 mg total) by mouth daily. Antonio Cyndee Jamee JONELLE, DO  Active   Probiotic Product [facility]) CAPS [id number]  Take 1 capsule by mouth daily. 1 per day [provider]  Active Self, Pharmacy Records  spironolactone  (ALDACTONE ) 25 MG tablet  [id number]  Take 0.5 tablets (12.5 mg total) by mouth daily. Daneen Damien BROCKS, NP  Active Self, Pharmacy Records  torsemide  (DEMADEX ) 20 MG tablet [id number]  Take 2 tablets (40 mg total) by mouth daily. Arrien, Elidia Sieving, MD  Active   TUBERCULIN BARNETT  1CC/27GX1/2 27G X 1/2 1 ML MISC [id number]   [provider]  Active Self, Pharmacy Records  valsartan  (DIOVAN ) 160 MG tablet [id number]  TAKE 1 AND 1/2 TABLETS ONCE DAILY  Patient taking differently: Take 160 mg by mouth 2 (two) times daily. TAKE 1 TWICE A DAY   Crenshaw, Redell RAMAN, MD  Active Self, Pharmacy Records  warfarin (COUMADIN ) 2.5 MG tablet [id number]  TAKE 1/2 TABLET TO 1 TABLET BY MOUTH DAILY AS DIRECTED BY COUMADIN  CLINIC  Patient taking differently: Take 1.25-2.5 mg by mouth See admin instructions. TAKE 1/2 TABLET TO 1 TABLET BY MOUTH ON SUNDAYS, TUESDAYS AND THURSDAYS, THEN TAKE WHOLE (2.5 MG) TABLET REST OF DAYS   Crenshaw, Redell RAMAN, MD  Active Self, Pharmacy Records            Recommendation:   Continue Current Plan of Care  Follow Up Plan:   Telephone follow-up in 1 week  Medford Balboa, BSN, RN [place]  VBCI - [facility] RN Care Manager [phone]

## 2024-10-14 ENCOUNTER — Encounter: Payer: Self-pay | Admitting: Cardiology

## 2024-10-14 ENCOUNTER — Encounter (HOSPITAL_BASED_OUTPATIENT_CLINIC_OR_DEPARTMENT_OTHER): Admitting: Student

## 2024-10-14 ENCOUNTER — Ambulatory Visit: Attending: Cardiology | Admitting: Cardiology

## 2024-10-14 VITALS — BP 140/78 | HR 52 | Ht 63.0 in | Wt 222.1 lb

## 2024-10-14 DIAGNOSIS — I4819 Other persistent atrial fibrillation: Secondary | ICD-10-CM | POA: Diagnosis present

## 2024-10-14 DIAGNOSIS — Z8719 Personal history of other diseases of the digestive system: Secondary | ICD-10-CM | POA: Diagnosis present

## 2024-10-14 DIAGNOSIS — G4733 Obstructive sleep apnea (adult) (pediatric): Secondary | ICD-10-CM | POA: Insufficient documentation

## 2024-10-14 DIAGNOSIS — R1084 Generalized abdominal pain: Secondary | ICD-10-CM | POA: Insufficient documentation

## 2024-10-14 DIAGNOSIS — I480 Paroxysmal atrial fibrillation: Secondary | ICD-10-CM | POA: Insufficient documentation

## 2024-10-14 DIAGNOSIS — I10 Essential (primary) hypertension: Secondary | ICD-10-CM | POA: Insufficient documentation

## 2024-10-14 DIAGNOSIS — I5032 Chronic diastolic (congestive) heart failure: Secondary | ICD-10-CM | POA: Diagnosis present

## 2024-10-14 DIAGNOSIS — I5030 Unspecified diastolic (congestive) heart failure: Secondary | ICD-10-CM | POA: Insufficient documentation

## 2024-10-14 DIAGNOSIS — M069 Rheumatoid arthritis, unspecified: Secondary | ICD-10-CM | POA: Diagnosis present

## 2024-10-14 MED ORDER — TORSEMIDE 20 MG PO TABS: 20.0000 mg | ORAL_TABLET | Freq: Two times a day (BID) | ORAL | 0 refills | Status: AC

## 2024-10-14 MED ORDER — CARVEDILOL 6.25 MG PO TABS: 6.2500 mg | ORAL_TABLET | Freq: Two times a day (BID) | ORAL | 3 refills | Status: AC

## 2024-10-14 NOTE — Patient Instructions (Signed)
 Medication Instructions:  Your physician recommends that you continue on your current medications as directed. Please refer to the Current Medication list given to you today.  *If you need a refill on your cardiac medications before your next appointment, please call your pharmacy*   Lab Work: None ordered If you have labs (blood work) drawn today and your tests are completely normal, you will receive your results only by: MyChart Message (if you have MyChart) OR A paper copy in the mail If you have any lab test that is abnormal or we need to change your treatment, we will call you to review the results.   Testing/Procedures: None ordered   Follow-Up: At [facility], you and your health needs are our priority.  As part of our continuing mission to provide you with exceptional heart care, we have created designated Provider Care Teams.  These Care Teams include your primary Cardiologist (physician) and Advanced Practice Providers (APPs -  Physician Assistants and Nurse Practitioners) who all work together to provide you with the care you need, when you need it.  We recommend signing up for the patient portal called "MyChart".  Sign up information is provided on this After Visit Summary.  MyChart is used to connect with patients for Virtual Visits (Telemedicine).  Patients are able to view lab/test results, encounter notes, upcoming appointments, etc.  Non-urgent messages can be sent to your provider as well.   To learn more about what you can do with MyChart, go to [email or web address].    Your next appointment:   4 month(s)  The format for your next appointment:   In Person  Provider:   Gypsy Balsam, MD    Other Instructions none  Important Information About Sugar

## 2024-10-14 NOTE — Progress Notes (Signed)
 " Cardiology Office Note:    Date:  10/14/2024   ID:  [name], DOB Aug 31, 1946, MRN [id number]  PCP:  Domenica Harlene LABOR, MD  Cardiologist:  Lamar Fitch, MD    Referring MD: Domenica Harlene LABOR, MD   No chief complaint on file. Doing fine  History of Present Illness:    [name] is a 78 y.o. female complex past medical history which include essential hypertension, heart failure with preserved left ventricular ejection fraction, hard of hearing, solitary kidney, obesity, remote coronary artery disease, rheumatological issue including chron's disease.  She was referred to us  for evaluation.  Overall she is doing fine now.  Previously issue was swelling of lower extremities weight gain as well as shortness of breath.  All appropriately managed and she is doing much better.  She denies have any chest pain tightness squeezing pressure burning chest.  Swelling of lower extremities seems to be well-controlled.  Denies have any palpitations.  Past Medical History:  Diagnosis Date   Abnormal gait 08/18/2021   Acute midline thoracic back pain 09/07/2023   Acute on chronic diastolic CHF (congestive heart failure) (HCC) 09/13/2024   Acute respiratory failure with hypoxia (HCC) 09/12/2024   Afib (HCC) 01/15/2009   Qualifier: History of   By: Georgian ROSALEA CHARM Lamar        Allergic rhinitis 09/06/2007   Qualifier: Diagnosis of   By: Georgian ROSALEA CHARM Lamar     IMO SNOMED Dx Update Oct 2024     Allergy    Anemia 06/09/2024   Anxiety    Arthritis    Basal cell carcinoma 02/07/2012   Follows with [facility], Dr Dale     Bilateral leg edema 09/12/2012   Blepharitis 11/17/2019   Blood transfusion without reported diagnosis    CAD (coronary artery disease)    Cerebrovascular disease 07/30/2014   Chest pain 09/11/2023   Chronic kidney disease, stage 3a (HCC) 09/18/2024   Coronary atherosclerosis 09/06/2007   Centricity Description: CORONARY ARTERY DISEASE  Qualifier: Diagnosis of   By:  Anice CMA, Darlene      Centricity Description: CAD, UNSPECIFIED SITE  Qualifier: Diagnosis of   By: Pietro, MD, CODY Redell Dimes        COVID-19 08/28/2023   Crohn's colitis (HCC)    she reports ulcerative colitis beginning in her 43's, biopsies 2008 suggest Crohn's not UC   Crohn's disease (HCC) 08/18/2021   Depression    DEPRESSION/ANXIETY 09/06/2007   Qualifier: Diagnosis of   By: Anice CMA, Darlene         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   Qualifier: Diagnosis of   By: Georgian ROSALEA CHARM Lamar  [phone]  10/2006 had a right kidney removed for a possible infection? Lesion?  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   Headache 02/24/2009   Qualifier: Diagnosis of   By: Georgian ROSALEA CHARM Lamar     IMO SNOMED Dx Update Oct 2024     Hearing loss 09/06/2007   Qualifier: Diagnosis of   By: Anice CMA, Darlene         Hip pain, chronic, left 12/03/2018  History of colonic polyps 07/04/2007   2 DIMINUTIVE SIGMOID ADENOMAS 9/08  08/2012 - 3 diminutive adenomas     IMO SNOMED Dx Update Oct 2024     History of malignant neoplasm of skin 08/18/2021   Hyperglycemia 07/02/2009   Qualifier: Diagnosis of   By: Georgian ROSALEA CHARM Lamar        Hyperlipidemia    Hypertension    Hypertensive urgency 09/13/2024   IBS (irritable bowel syndrome)    Intertrigo 11/12/2013   Keloid of skin    Low back pain 01/07/2009   Qualifier: Diagnosis of   By: Avram MD, NOLIA Pitts E        Maxillary sinus cyst 02/07/2012   Medicare annual wellness visit, subsequent 08/17/2012   Sees Dr Pietro of cardiology  Sees [facility]ology  [facility] of [facility]  Sees Dr Avram, [facility], last colonoscopy 09/10/12, will  have repeat colonoscopy soon     Morbid obesity (HCC) 09/25/2023   MYOCARDIAL INFARCTION, HX OF 09/06/2007   Qualifier: Diagnosis of   By: Anice CMA, Darlene         Obesity, class 3 (HCC) 01/03/2008   Qualifier: Diagnosis of   By: Derrek Ramp         Obesity, Class III, BMI 40-49.9 (morbid obesity) (HCC) 09/13/2024   Onychomycosis 06/03/2018   OSA (obstructive sleep apnea)    Osteoporosis 11/05/2007            Other acne 04/21/2014   Other specified disorders of kidney and ureter 09/07/2023   Pain in joint of right shoulder 04/21/2014   Pancreatitis    Paroxysmal atrial fibrillation (HCC)    Pseudoaneurysm of right femoral artery    Psoriasis 08/04/2021   Radicular low back pain 06/28/2012   L1-2: No impingement. Mild disc bulge.   L2-3: Borderline right foraminal stenosis due to disc bulge.   L3-4: Mild left foraminal stenosis due to disc bulge,   intervertebral spurring, and facet arthropathy.   L4-5: Mild right and borderline left foraminal stenosis with mild   bilateral subarticular lateral recess stenosis and borderline   central stenosis due to facet arthropathy and disc bulge.    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   Shoulder pain 06/09/2024   Sinusitis 06/30/2014   Sleep apnea    Statin intolerance 02/28/2011   Type 1 diabetes mellitus with hyperglycemia (HCC) 09/24/2024   UC (ulcerative colitis) (HCC)    Uncontrolled hypertension 09/10/2024   Uveitis 08/18/2021   Vitamin D  deficiency 08/04/2021    Past Surgical History:  Procedure Laterality Date   ANGIOPLASTY     BREAST BIOPSY  10/24/1994   benign lesion    Cataract surgery Left 11/13/2014   CHOLECYSTECTOMY  10/24/2006   COLONOSCOPY W/ BIOPSIES     CORONARY ANGIOPLASTY     ESOPHAGOGASTRODUODENOSCOPY     EYE SURGERY     NEPHRECTOMY  10/24/2006   right secondary  to oncocytoma    RIGHT FEMORAL  PSEUDOANEURYSM & RIGHT GROIN HEMATOMA EVACUATION  02/22/2007   RIGHT RENAL ARTERY REPAIR  10/24/1974   TUBAL LIGATION      Current Medications: Active Medications[1]   Allergies:   Hydralazine , Cefdinir , Codeine, Fosamax [alendronate sodium], Gluten meal, Guaifenesin er, Latex, Percocet [oxycodone -acetaminophen ], and Psyllium   Social History   Socioeconomic History   Marital status: Married  Spouse name: Not on file   Number of children: 3   Years of education: Not on file   Highest education level: 12th grade  Occupational History   Occupation: Retired    Associate Professor: RETIRED    Comment: retired  Tobacco Use   Smoking status: Never   Smokeless tobacco: Never   Tobacco comments:    Never smoked 03/06/24  Vaping Use   Vaping status: Never Used  Substance and Sexual Activity   Alcohol use: No   Drug use: No   Sexual activity: Not Currently    Birth control/protection: Condom, Other-see comments    Comment: Tbal ligation  Other Topics Concern   Not on file  Social History Narrative   Married since 1965 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, 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 - adults, 2 grandchildrenNo pets -Daughter has breast cancer   Social Drivers of Health   Tobacco Use: Low Risk (10/14/2024)   Patient History    Smoking Tobacco Use: Never    Smokeless Tobacco Use: Never    Passive Exposure: Not on file  Financial Resource Strain: Low Risk (06/08/2024)   Overall Financial Resource Strain (CARDIA)    Difficulty of Paying Living Expenses: Not hard at all  Food Insecurity: No Food Insecurity (09/20/2024)   Epic    Worried About Radiation Protection Practitioner of Food in the Last Year: Never true    Ran Out of Food in the Last Year: Never true  Transportation Needs: No Transportation Needs (09/20/2024)   Epic    Lack of Transportation (Medical): No    Lack of Transportation (Non-Medical): No  Physical Activity: Patient Declined (06/08/2024)   Exercise  Vital Sign    Days of Exercise per Week: Patient declined    Minutes of Exercise per Session: Patient declined  Stress: No Stress Concern Present (06/08/2024)   [employer] of Occupational Health - Occupational Stress Questionnaire    Feeling of Stress: Not at all  Social Connections: Unknown (09/13/2024)   Social Connection and Isolation Panel    Frequency of Communication with Friends and Family: More than three times a week    Frequency of Social Gatherings with Friends and Family: Three times a week    Attends Religious Services: Patient declined    Active Member of Clubs or Organizations: Patient declined    Attends Banker Meetings: Patient declined    Marital Status: Married  Depression (PHQ2-9): Low Risk (06/11/2024)   Depression (PHQ2-9)    PHQ-2 Score: 0  Alcohol Screen: Low Risk (06/08/2024)   Alcohol Screen    Last Alcohol Screening Score (AUDIT): 0  Housing: Unknown (09/20/2024)   Epic    Unable to Pay for Housing in the Last Year: No    Number of Times Moved in the Last Year: Not on file    Homeless in the Last Year: No  Utilities: Not At Risk (09/20/2024)   Epic    Threatened with loss of utilities: No  Health Literacy: Adequate Health Literacy (06/11/2024)   B1300 Health Literacy    Frequency of need for help with medical instructions: Never     Family History: The patient's family history includes Allergies in her mother; Asthma in her daughter and maternal uncle; Cancer in her daughter and daughter; Clotting disorder in her brother, mother, and sister; Colitis in her father; Colon cancer in her paternal aunt; Coronary artery disease in her mother; Heart disease in her father and mother; Hypertension in her mother; Stroke in her brother and  mother. There is no history of Colon polyps, Kidney disease, Stomach cancer, or Esophageal cancer. ROS:   Please see the history of present illness.    All 14 point review of systems negative except as described  per history of present illness  EKGs/Labs/Other Studies Reviewed:    EKG Interpretation Date/Time:  Monday October 14 2024 11:17:40 EST Ventricular Rate:  52 PR Interval:  184 QRS Duration:  84 QT Interval:  492 QTC Calculation: 457 R Axis:   5  Text Interpretation: Sinus bradycardia Minimal voltage criteria for LVH, may be normal variant ( R in aVL ) When compared with ECG of 12-Sep-2024 14:39, PREVIOUS ECG IS PRESENT Confirmed by Bernie Charleston [phone]) on 10/14/2024 11:18:30 AM    Recent Labs: 06/10/2024: TSH 3.64 09/12/2024: Pro Brain Natriuretic Peptide 1,773.0 09/16/2024: Hemoglobin 9.1; Magnesium  2.0; Platelets 188 09/18/2024: ALT 13; BUN 35; Creatinine, Ser 1.70; Potassium 4.0; Sodium 135  Recent Lipid Panel    Component Value Date/Time   CHOL 159 06/10/2024 1127   TRIG 96.0 06/10/2024 1127   HDL 47.30 06/10/2024 1127   CHOLHDL 3 06/10/2024 1127   VLDL 19.2 06/10/2024 1127   LDLCALC 92 06/10/2024 1127    Physical Exam:    VS:  BP (!) 140/78   Pulse (!) 52   Ht 5' 3 (1.6 m)   Wt 222 lb 2 oz (100.8 kg)   SpO2 95%   BMI 39.35 kg/m     Wt Readings from Last 3 Encounters:  10/14/24 222 lb 2 oz (100.8 kg)  10/10/24 225 lb (102.1 kg)  10/03/24 225 lb (102.1 kg)     GEN:  Well nourished, well developed in no acute distress HEENT: Normal NECK: No JVD; No carotid bruits LYMPHATICS: No lymphadenopathy CARDIAC: RRR, no murmurs, no rubs, no gallops RESPIRATORY:  Clear to auscultation without rales, wheezing or rhonchi  ABDOMEN: Soft, non-tender, non-distended MUSCULOSKELETAL:  No edema; No deformity  SKIN: Warm and dry LOWER EXTREMITIES: no swelling NEUROLOGIC:  Alert and oriented x 3 PSYCHIATRIC:  Normal affect   ASSESSMENT:    1. Persistent atrial fibrillation (HCC)   2. Hx of Crohn's disease   3. Generalized abdominal pain   4. Essential hypertension   5. Paroxysmal atrial fibrillation (HCC)   6. OSA (obstructive sleep apnea)   7. Rheumatoid  arthritis, involving unspecified site, unspecified whether rheumatoid factor present (HCC)   8. Chronic diastolic congestive heart failure (HCC)    PLAN:    In order of problems listed above:  Congestive heart failure diastolic in nature, appears to be compensated on physical exam she knows very well that she need to check her weight every single day and let us  know if weight goes up by 2 pounds over the next 2 or 3 consecutive days.  She also not normal if she develops swelling of lower extremities she is to let us  know so we can take care of this quicker. Paroxysmal atrial fibrillation, on Tikosyn , asymptomatic no dizziness no passing out continue present management. Essential hypertension, blood pressure well-controlled continue present management. Rheumatoid arthritis follow-up excellently like always by rheumatologist. Obstructive sleep apnea on CPAP mask since 2008, she tells me that this really helps a lot.   Medication Adjustments/Labs and Tests Ordered: Current medicines are reviewed at length with the patient today.  Concerns regarding medicines are outlined above.  Orders Placed This Encounter  Procedures   EKG 12-Lead   Medication changes:  Meds ordered this encounter  Medications   torsemide  (DEMADEX )  20 MG tablet    Sig: Take 1 tablet (20 mg total) by mouth 2 (two) times daily.    Dispense:  180 tablet    Refill:  0   carvedilol  (COREG ) 6.25 MG tablet    Sig: Take 1 tablet (6.25 mg total) by mouth 2 (two) times daily with a meal.    Dispense:  180 tablet    Refill:  3    Signed, Lamar DOROTHA Fitch, MD, [facility] 10/14/2024 12:16 PM    [facility]     [1]  Current Meds  Medication Sig   clobetasol ointment (TEMOVATE) 0.05 % Apply 1 Application topically 2 (two) times daily.   dextrose 5 % SOLN 50 mL with methotrexate 25 MG/ML (PF) SOLN 40 mg/m2 Inject 40 mg/m2 into the vein once a week. Every wednesday   dofetilide  (TIKOSYN ) 125 MCG  capsule Take 2 capsules (250 mcg total) by mouth 2 (two) times daily.   escitalopram  (LEXAPRO ) 10 MG tablet Take 1 tablet (10 mg total) by mouth daily.   esomeprazole  (NEXIUM ) 40 MG capsule Take 1 capsule (40 mg total) by mouth daily. Take in the morning before breakfast.   esomeprazole  (NEXIUM ) 40 MG capsule Take 40 mg by mouth daily at 12 noon.   folic acid  (FOLVITE ) 1 MG tablet Take 2 mg by mouth daily.   hydrocortisone  (ANUSOL -HC) 25 MG suppository Place 1 suppository (25 mg total) rectally 2 (two) times daily as needed for hemorrhoids or anal itching.   Methotrexate Sodium (METHOTREXATE, PF,) 50 MG/2ML injection Inject 2 mLs into the muscle once a week.   Multiple Vitamin (MULTIVITAMIN) capsule Take 1 capsule by mouth daily. 1 daily   nitroGLYCERIN  (NITROSTAT ) 0.4 MG SL tablet Place 1 tablet (0.4 mg total) under the tongue every 5 (five) minutes as needed for chest pain. Do not exceed 3 tabs in 15 minutes   OXYGEN Inhale 2 L/min into the lungs at bedtime.   pantoprazole  (PROTONIX ) 20 MG tablet Take 1 tablet (20 mg total) by mouth daily.   Probiotic Product (PHILLIPS COLON HEALTH) CAPS Take 1 capsule by mouth daily. 1 per day   spironolactone  (ALDACTONE ) 25 MG tablet Take 0.5 tablets (12.5 mg total) by mouth daily.   TUBERCULIN SYR 1CC/27GX1/2 27G X 1/2 1 ML MISC    valsartan  (DIOVAN ) 160 MG tablet TAKE 1 AND 1/2 TABLETS ONCE DAILY   warfarin (COUMADIN ) 2.5 MG tablet TAKE 1/2 TABLET TO 1 TABLET BY MOUTH DAILY AS DIRECTED BY COUMADIN  CLINIC   [DISCONTINUED] carvedilol  (COREG ) 25 MG tablet Take 6.25 mg by mouth 2 (two) times daily with a meal.   [DISCONTINUED] torsemide  (DEMADEX ) 20 MG tablet Take 2 tablets (40 mg total) by mouth daily.   "

## 2024-10-17 NOTE — Progress Notes (Unsigned)
" ° °  Acute Office Visit  Subjective:     Patient ID: [name], female    DOB: 1946/06/29, 78 y.o.   MRN: [id number]  No chief complaint on file.   HPI Patient is in today for ***  ROS      Objective:    There were no vitals taken for this visit. {Vitals History (Optional):23777}  Physical Exam  No results found for any visits on 10/18/24.      Assessment & Plan:   Problem List Items Addressed This Visit   None  FOBT Check CBC  B12, iron, folate, zinc, CBC  rx Chlorhexidine 0.12% rinse, 15 mL BID Magic mouthwash (lidocaine  + diphenhydramine  + Maalox) swish/spit Q4-6h PRN  Chlorhexidine 0.12% rinse (BID) helps reduce bacterial load --SWISH/ SPIT DONT SWALLOW  Magic mouthwash (lidocaine  + diphenhydramine  + Maalox), swish/spit Q4-6h PRN is for pain r  ***Do not use at the exact same time. Separate by at least 30-60 minutes so one doesnt dilute or wash out the other.  Dont swallow lidocaine -containing rinses. Limit prolonged chlorhexidine use (risk staining, taste changes).  NOTE--Chlorhexidine 0.12% BID for oral antisepsis; magic mouthwash Q4-6h PRN pain, separate rinses by >=30-60 min.  If ulcers persist >2 weeks, are severe, or theres concern for infection ? re-evaluate.  If ulcers coincide with flare ? coordinate with GI / adjust IBD therapy    No orders of the defined types were placed in this encounter.   No follow-ups on file.  Harlene LITTIE Jolly, NP   "

## 2024-10-18 ENCOUNTER — Other Ambulatory Visit (HOSPITAL_BASED_OUTPATIENT_CLINIC_OR_DEPARTMENT_OTHER): Payer: Self-pay

## 2024-10-18 ENCOUNTER — Ambulatory Visit: Admitting: Student

## 2024-10-18 ENCOUNTER — Encounter: Payer: Self-pay | Admitting: Student

## 2024-10-18 VITALS — BP 160/60 | HR 54 | Temp 98.0°F | Resp 16 | Ht 63.0 in | Wt 219.2 lb

## 2024-10-18 DIAGNOSIS — K121 Other forms of stomatitis: Secondary | ICD-10-CM | POA: Diagnosis not present

## 2024-10-18 DIAGNOSIS — K219 Gastro-esophageal reflux disease without esophagitis: Secondary | ICD-10-CM | POA: Diagnosis not present

## 2024-10-18 DIAGNOSIS — R490 Dysphonia: Secondary | ICD-10-CM

## 2024-10-18 DIAGNOSIS — B372 Candidiasis of skin and nail: Secondary | ICD-10-CM | POA: Diagnosis not present

## 2024-10-18 DIAGNOSIS — Z8719 Personal history of other diseases of the digestive system: Secondary | ICD-10-CM | POA: Diagnosis not present

## 2024-10-18 MED ORDER — NYSTATIN-TRIAMCINOLONE 100000-0.1 UNIT/GM-% EX OINT
1.0000 | TOPICAL_OINTMENT | Freq: Two times a day (BID) | CUTANEOUS | 0 refills | Status: AC
  Filled 2024-10-18: qty 30, 30d supply, fill #0

## 2024-10-18 MED ORDER — NYSTATIN 100000 UNIT/ML MT SUSP
5.0000 mL | Freq: Four times a day (QID) | OROMUCOSAL | 0 refills | Status: AC | PRN
  Filled 2024-10-18: qty 240, 12d supply, fill #0

## 2024-10-18 MED ORDER — MAGIC MOUTHWASH W/LIDOCAINE: 5.0000 mL | Freq: Four times a day (QID) | ORAL | 0 refills | Status: AC | PRN

## 2024-10-18 NOTE — Assessment & Plan Note (Signed)
 Rx Mycolog ointment Ensure the affected areas of folded skin remain dry by thoroughly drying them after bathing and following any activities that cause sweating. Keep the areas clean, dry

## 2024-10-18 NOTE — Assessment & Plan Note (Signed)
 Refer to GI for further evaluation.

## 2024-10-18 NOTE — Assessment & Plan Note (Signed)
-   Continue Nexium  40 mg twice daily. -Restart Protonix  20 mg daily - Referred to gastroenterology for further evaluation

## 2024-10-18 NOTE — Assessment & Plan Note (Addendum)
 Rx- magic mouthwash  RTC if symptoms persist

## 2024-10-21 ENCOUNTER — Other Ambulatory Visit: Payer: Self-pay

## 2024-10-21 NOTE — Patient Instructions (Signed)
 Visit Information  Thank you for taking time to visit with me today. Please don't hesitate to contact me if I can be of assistance to you before our next scheduled telephone appointment.  Following is a copy of your care plan:   Goals Addressed             This Visit's Progress    COMPLETED: VBCI Transitions of Care (TOC) Care Plan       Problems: (reviewed 10/21/24) Recent Hospitalization for treatment of CHF Hospital or ED Adm Risk 57%  Goal: (reviewed 10/21/24) Over the next 30 days, the patient will not experience hospital readmission  Interventions: (reviewed 10/21/24)  Heart Failure Interventions: Basic overview and discussion of pathophysiology of Heart Failure reviewed Provided education on low sodium diet Assessed need for readable accurate scales in home Advised patient to weigh each morning after emptying bladder Discussed importance of daily weight and advised patient to weigh and record daily Reviewed role of diuretics in prevention of fluid overload and management of heart failure; Discussed the importance of keeping all appointments with provider Provided patient with education about the role of exercise in the management of heart failure Take Spirnolactone and Torsemide  daily as directed O2 at night at 2l/min as needed HHRN weekly to monitor BP 12/18 - Carvedilol  is now 6.25mg  twice a daily to maintain HR above 50  Patient Self Care Activities: (reviewed 10/21/24) Attend all scheduled provider appointments Attend church or other social activities Call provider office for new concerns or questions  Notify RN Care Manager of TOC call rescheduling needs Participate in Transition of Care Program/Attend TOC scheduled calls Perform all self care activities independently  Take medications as prescribed   The patient has A-fib and is on Coumadin  - Take Warfarin as directed and follow up with Coumadin  clinic for PT/INR   12/29 - Developed mouth sores of unknown  cause. Received Lidocaine  mouthwash which she states helps  Plan:  Telephone follow up appointment with care management team member scheduled for:  The patient has completed the 30 Day TOC Program        Patient verbalizes understanding of instructions and care plan provided today and agrees to view in MyChart. Active MyChart status and patient understanding of how to access instructions and care plan via MyChart confirmed with patient.     The patient has been provided with contact information for the care management team and has been advised to call with any health related questions or concerns.   Please call the care guide team at [phone] if you need to cancel or reschedule your appointment.   Please call the Suicide and Crisis Lifeline: 988 call the [place]  National Suicide Prevention Lifeline: 864-537-6592 or TTY: 769-002-9827 TTY 2547845908) to talk to a trained counselor if you are experiencing a Mental Health or Behavioral Health Crisis or need someone to talk to.  Medford Balboa, BSN, RN [place]  VBCI - [employer] [facility] RN Care Manager [phone]

## 2024-10-21 NOTE — Transitions of Care (Post Inpatient/ED Visit) (Signed)
 " Transition of Care Week 5  Visit Note  10/21/2024  Name: [name] MRN: [id number]          DOB: 1946-04-29  Situation: Patient enrolled in [facility] 30-day program. Visit completed with Arland Quan by telephone.   Background:   Initial Transition Care Management Follow-up Telephone Call Discharge Date and Diagnosis: 09/18/24, CHF exacerbation   Past Medical History:  Diagnosis Date   Abnormal gait 08/18/2021   Acute midline thoracic back pain 09/07/2023   Acute on chronic diastolic CHF (congestive heart failure) (HCC) 09/13/2024   Acute respiratory failure with hypoxia (HCC) 09/12/2024   Afib (HCC) 01/15/2009   Qualifier: History of   By: Georgian ROSALEA CHARM Lamar        Allergic rhinitis 09/06/2007   Qualifier: Diagnosis of   By: Georgian ROSALEA CHARM Lamar     IMO SNOMED Dx Update Oct 2024     Allergy    Anemia 06/09/2024   Anxiety    Arthritis    Basal cell carcinoma 02/07/2012   Follows with [facility], Dr Dale     Bilateral leg edema 09/12/2012   Blepharitis 11/17/2019   Blood transfusion without reported diagnosis    CAD (coronary artery disease)    Cerebrovascular disease 07/30/2014   Chest pain 09/11/2023   Chronic kidney disease, stage 3a (HCC) 09/18/2024   Coronary atherosclerosis 09/06/2007   Centricity Description: CORONARY ARTERY DISEASE  Qualifier: Diagnosis of   By: Anice CMA, Darlene      Centricity Description: CAD, UNSPECIFIED SITE  Qualifier: Diagnosis of   By: Pietro, MD, CODY Redell Dimes        COVID-19 08/28/2023   Crohn's colitis (HCC)    she reports ulcerative colitis beginning in her 60's, biopsies 2008 suggest Crohn's not UC   Crohn's disease (HCC) 08/18/2021   Depression    DEPRESSION/ANXIETY 09/06/2007   Qualifier: Diagnosis of   By: Anice CMA, Darlene         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   Qualifier: Diagnosis of   By: Georgian ROSALEA CHARM Lamar  [phone]  10/2006 had a right kidney removed for a possible infection?  Lesion?  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   Headache 02/24/2009   Qualifier: Diagnosis of   By: Georgian ROSALEA CHARM Lamar     IMO SNOMED Dx Update Oct 2024     Hearing loss 09/06/2007   Qualifier: Diagnosis of   By: Anice CMA, Darlene         Hip pain, chronic, left 12/03/2018   History of colonic polyps 07/04/2007   2 DIMINUTIVE SIGMOID ADENOMAS 9/08  08/2012 - 3 diminutive adenomas     IMO SNOMED Dx Update Oct 2024     History of malignant neoplasm of skin 08/18/2021   Hyperglycemia 07/02/2009   Qualifier: Diagnosis of   By: Georgian ROSALEA CHARM Lamar        Hyperlipidemia    Hypertension    Hypertensive urgency 09/13/2024   IBS (irritable bowel syndrome)    Intertrigo 11/12/2013   Keloid of skin    Low back pain 01/07/2009   Qualifier: Diagnosis of  By: Avram MD, NOLIA Pitts E        Maxillary sinus cyst 02/07/2012   Medicare annual wellness visit, subsequent 08/17/2012   Sees Dr Pietro of cardiology  Sees [facility]  [facility] Dale of [facility]  Sees Dr Avram, [facility], last colonoscopy 09/10/12, will have repeat colonoscopy soon     Morbid obesity (HCC) 09/25/2023   MYOCARDIAL INFARCTION, HX OF 09/06/2007   Qualifier: Diagnosis of   By: Anice CMA, Darlene         Obesity, class 3 (HCC) 01/03/2008   Qualifier: Diagnosis of   By: Derrek Ramp         Obesity, Class III, BMI 40-49.9 (morbid obesity) (HCC) 09/13/2024   Onychomycosis 06/03/2018   OSA (obstructive sleep apnea)    Osteoporosis 11/05/2007            Other acne 04/21/2014   Other specified disorders of kidney and ureter 09/07/2023   Pain in joint of right shoulder 04/21/2014   Pancreatitis    Paroxysmal atrial  fibrillation (HCC)    Pseudoaneurysm of right femoral artery    Psoriasis 08/04/2021   Radicular low back pain 06/28/2012   L1-2: No impingement. Mild disc bulge.   L2-3: Borderline right foraminal stenosis due to disc bulge.   L3-4: Mild left foraminal stenosis due to disc bulge,   intervertebral spurring, and facet arthropathy.   L4-5: Mild right and borderline left foraminal stenosis with mild   bilateral subarticular lateral recess stenosis and borderline   central stenosis due to facet arthropathy and disc bulge.    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   Shoulder pain 06/09/2024   Sinusitis 06/30/2014   Sleep apnea    Statin intolerance 02/28/2011   Type 1 diabetes mellitus with hyperglycemia (HCC) 09/24/2024   UC (ulcerative colitis) (HCC)    Uncontrolled hypertension 09/10/2024   Uveitis 08/18/2021   Vitamin D  deficiency 08/04/2021    Assessment: Patient Reported Symptoms: Cognitive Cognitive Status: Alert and oriented to person, place, and time, Normal speech and language skills      Neurological Neurological Review of Symptoms: No symptoms reported    HEENT HEENT Symptoms Reported: Mouth or teeth pain, Sore throat HEENT Management Strategies: Medication therapy, Routine screening HEENT Comment: The patient states her medications for RA could be causing mouth sores and she is using the magic moutwash    Cardiovascular Cardiovascular Symptoms Reported: Swelling in legs or feet Does patient have uncontrolled Hypertension?: No Cardiovascular Management Strategies: Medication therapy, Routine screening, Coping strategies Do You Have a Working Readable Scale?: Yes Weight: 220 lb (99.8 kg) Cardiovascular Comment: BP is 137/65 and HR is 64.  Respiratory Respiratory Symptoms Reported: No symptoms reported Additional Respiratory Details: Oxygen  at night Respiratory Management Strategies: Oxygen therapy, Routine screening, Medication therapy  Endocrine Endocrine Symptoms Reported: No symptoms reported    Gastrointestinal Gastrointestinal Symptoms Reported: No symptoms reported      Genitourinary Genitourinary Symptoms Reported: Incontinence    Integumentary Integumentary Symptoms Reported: No symptoms reported    Musculoskeletal Musculoskelatal Symptoms Reviewed: Joint pain Additional Musculoskeletal Details: RA Musculoskeletal Comment: HHPT      Psychosocial Psychosocial Symptoms Reported: No symptoms reported         Today's Vitals   10/21/24 1007  Weight: 220 lb (99.8 kg)      Medications Reviewed Today  Reviewed by Moises Reusing, RN (Case Manager) on 10/21/24 at 1003  Med List Status: <None>   Medication Order Taking? Sig Documenting Provider Last Dose Status Informant  carvedilol  (COREG ) 6.25 MG tablet [id number] Yes Take 1 tablet (6.25 mg total) by mouth 2 (two) times daily with a meal. Krasowski, Robert J, MD  Active   clobetasol ointment (TEMOVATE) 0.05 % [id number] Yes Apply 1 Application topically 2 (two) times daily. [provider]  Active Self, Pharmacy Records  dextrose 5 % SOLN 50 mL with methotrexate 25 MG/ML (PF) SOLN 40 mg/m2 [id number] Yes Inject 40 mg/m2 into the vein once a week. Every wednesday [provider]  Active Self, Pharmacy Records  dofetilide  (TIKOSYN ) 125 MCG capsule [id number] Yes Take 2 capsules (250 mcg total) by mouth 2 (two) times daily. Arrien, Elidia Sieving, MD  Active   escitalopram  (LEXAPRO ) 10 MG tablet [id number] Yes Take 1 tablet (10 mg total) by mouth daily. Domenica Harlene LABOR, MD  Active Self, Pharmacy Records  esomeprazole  (NEXIUM ) 40 MG capsule [id number] Yes Take 1 capsule (40 mg total) by mouth daily. Take in the morning before breakfast. Ever Greig RAMAN, PA-C  Active Self, Pharmacy Records  esomeprazole  (NEXIUM ) 40 MG capsule [id number] Yes Take 40  mg by mouth daily at 12 noon. [provider]  Active   folic acid  (FOLVITE ) 1 MG tablet [id number] Yes Take 2 mg by mouth daily. [provider]  Active Self, Pharmacy Records  hydrocortisone  (ANUSOL -HC) 25 MG suppository [id number] Yes Place 1 suppository (25 mg total) rectally 2 (two) times daily as needed for hemorrhoids or anal itching. Avram Lupita BRAVO, MD  Active Self, Pharmacy Records  lidocaine -diphenhydrAMINE -nystatin -hydrocortisone  [id number] Yes Swish and spit 5 mLs by mouth 4 (four) times daily as needed for mouth pain. Yacopino, Jessica L, NP  Active   magic mouthwash w/lidocaine  SOLN [id number] Yes Take 5 mLs by mouth 4 (four) times daily as needed for mouth pain. Swish and Rachael Jolly, Jessica L, NP  Active   Methotrexate Sodium (METHOTREXATE, PF,) 50 MG/2ML injection [id number] Yes Inject 2 mLs into the muscle once a week. [provider]  Active Self, Pharmacy Records           Med Note (SATTERFIELD, TEENA BRAVO   Fri Sep 13, 2024  4:05 PM) Patient and daughter verified she is taking weekly usually on Wednesdays   Multiple Vitamin (MULTIVITAMIN) capsule [id number] Yes Take 1 capsule by mouth daily. 1 daily [provider]  Active Self, Pharmacy Records  nitroGLYCERIN  (NITROSTAT ) 0.4 MG SL tablet [id number] Yes Place 1 tablet (0.4 mg total) under the tongue every 5 (five) minutes as needed for chest pain. Do not exceed 3 tabs in 15 minutes Antonio Meth, Yvonne R, DO  Active   nystatin -triamcinolone  ointment [facility]) [id number] Yes Apply 1 Application topically 2 (two) times daily. Jolly Harlene CROME, NP  Active   OXYGEN [id number] Yes Inhale 2 L/min into the lungs at bedtime. [provider]  Active   pantoprazole  (PROTONIX ) 20 MG tablet [id number] Yes Take 1 tablet (20 mg total) by mouth daily. Antonio Meth Jamee JONELLE, DO  Active   Probiotic Product [facility]) CAPS [id number] Yes Take 1 capsule by mouth daily. 1 per day [provider]   Active Self, Pharmacy Records  spironolactone  (ALDACTONE ) 25 MG tablet [id number] Yes Take 0.5 tablets (12.5 mg total) by mouth daily. Daneen Damien BROCKS, NP  Active Self, Pharmacy Records  torsemide  (DEMADEX ) 20 MG tablet [id number] Yes Take 1 tablet (  20 mg total) by mouth 2 (two) times daily. Krasowski, Robert J, MD  Active   TUBERCULIN SYR 1CC/27GX1/2 27G X 1/2 1 ML MISC [id number] Yes  [provider]  Active Self, Pharmacy Records  valsartan  (DIOVAN ) 160 MG tablet [id number] Yes TAKE 1 AND 1/2 TABLETS ONCE DAILY Pietro Redell RAMAN, MD  Active Self, Pharmacy Records  warfarin (COUMADIN ) 2.5 MG tablet [id number] Yes TAKE 1/2 TABLET TO 1 TABLET BY MOUTH DAILY AS DIRECTED BY COUMADIN  CLINIC Crenshaw, Redell RAMAN, MD  Active Self, Pharmacy Records            Recommendation:   Discharged from [facility] Program  Follow Up Plan:   Closing From:  Transitions of Care Program  [facility], BSN, RN [place]  [facility] - [facility] RN Care Manager [phone]     "

## 2024-10-23 ENCOUNTER — Ambulatory Visit: Admitting: Pharmacist

## 2024-10-30 ENCOUNTER — Telehealth: Payer: Self-pay

## 2024-10-30 NOTE — Telephone Encounter (Signed)
 Copied from CRM #[id number]. Topic: Clinical - Home Health Verbal Orders >> Oct 30, 2024 12:57 PM Carlyon D wrote: Caller/Agency: [facility]  Callback Number: [phone] Service Requested: Skilled Nursing Frequency: once week visits  Any new concerns about the patient? Yes, vaginal area has open wound very little drainage causing discomfort.

## 2024-10-31 ENCOUNTER — Other Ambulatory Visit: Payer: Self-pay | Admitting: Student

## 2024-10-31 MED ORDER — ILEX SKIN PROTECTANT 58.3 % EX PSTE: PASTE | Freq: Three times a day (TID) | CUTANEOUS | 0 refills | Status: AC | PRN

## 2024-10-31 NOTE — Telephone Encounter (Signed)
 Spoke w/ Megan- verbal orders given, and recommendations given.

## 2024-11-01 NOTE — Telephone Encounter (Signed)
 Pharmacy unable to compound. Please advise.

## 2024-11-04 ENCOUNTER — Ambulatory Visit: Payer: Self-pay | Admitting: Gastroenterology

## 2024-11-04 ENCOUNTER — Encounter: Payer: Self-pay | Admitting: Gastroenterology

## 2024-11-04 ENCOUNTER — Ambulatory Visit: Admitting: Gastroenterology

## 2024-11-04 ENCOUNTER — Other Ambulatory Visit

## 2024-11-04 VITALS — BP 124/80 | HR 67 | Ht 63.0 in | Wt 219.4 lb

## 2024-11-04 DIAGNOSIS — R195 Other fecal abnormalities: Secondary | ICD-10-CM

## 2024-11-04 DIAGNOSIS — K5010 Crohn's disease of large intestine without complications: Secondary | ICD-10-CM

## 2024-11-04 DIAGNOSIS — Z8619 Personal history of other infectious and parasitic diseases: Secondary | ICD-10-CM

## 2024-11-04 DIAGNOSIS — K50118 Crohn's disease of large intestine with other complication: Secondary | ICD-10-CM | POA: Diagnosis not present

## 2024-11-04 DIAGNOSIS — R194 Change in bowel habit: Secondary | ICD-10-CM

## 2024-11-04 DIAGNOSIS — R1084 Generalized abdominal pain: Secondary | ICD-10-CM

## 2024-11-04 DIAGNOSIS — K219 Gastro-esophageal reflux disease without esophagitis: Secondary | ICD-10-CM

## 2024-11-04 LAB — CBC WITH DIFFERENTIAL/PLATELET
Basophils Absolute: 0 K/uL (ref 0.0–0.1)
Basophils Relative: 0.6 % (ref 0.0–3.0)
Eosinophils Absolute: 0.1 K/uL (ref 0.0–0.7)
Eosinophils Relative: 1.4 % (ref 0.0–5.0)
HCT: 31.6 % — ABNORMAL LOW (ref 36.0–46.0)
Hemoglobin: 10.6 g/dL — ABNORMAL LOW (ref 12.0–15.0)
Lymphocytes Relative: 25.1 % (ref 12.0–46.0)
Lymphs Abs: 1.5 K/uL (ref 0.7–4.0)
MCHC: 33.4 g/dL (ref 30.0–36.0)
MCV: 98.3 fl (ref 78.0–100.0)
Monocytes Absolute: 0.1 K/uL (ref 0.1–1.0)
Monocytes Relative: 2.1 % — ABNORMAL LOW (ref 3.0–12.0)
Neutro Abs: 4.3 K/uL (ref 1.4–7.7)
Neutrophils Relative %: 70.8 % (ref 43.0–77.0)
Platelets: 193 K/uL (ref 150.0–400.0)
RBC: 3.21 Mil/uL — ABNORMAL LOW (ref 3.87–5.11)
RDW: 15.6 % — ABNORMAL HIGH (ref 11.5–15.5)
WBC: 6 K/uL (ref 4.0–10.5)

## 2024-11-04 LAB — COMPREHENSIVE METABOLIC PANEL WITH GFR
ALT: 18 U/L (ref 3–35)
AST: 25 U/L (ref 5–37)
Albumin: 3.6 g/dL (ref 3.5–5.2)
Alkaline Phosphatase: 66 U/L (ref 39–117)
BUN: 64 mg/dL — ABNORMAL HIGH (ref 6–23)
CO2: 23 meq/L (ref 19–32)
Calcium: 8.9 mg/dL (ref 8.4–10.5)
Chloride: 103 meq/L (ref 96–112)
Creatinine, Ser: 2.6 mg/dL — ABNORMAL HIGH (ref 0.40–1.20)
GFR: 17.06 mL/min — ABNORMAL LOW
Glucose, Bld: 95 mg/dL (ref 70–99)
Potassium: 4.4 meq/L (ref 3.5–5.1)
Sodium: 139 meq/L (ref 135–145)
Total Bilirubin: 0.4 mg/dL (ref 0.2–1.2)
Total Protein: 7.5 g/dL (ref 6.0–8.3)

## 2024-11-04 LAB — SEDIMENTATION RATE: Sed Rate: 85 mm/h — ABNORMAL HIGH (ref 0–30)

## 2024-11-04 LAB — C-REACTIVE PROTEIN: CRP: 1 mg/dL (ref 1.0–20.0)

## 2024-11-04 MED ORDER — PANTOPRAZOLE SODIUM 40 MG PO TBEC: 40.0000 mg | DELAYED_RELEASE_TABLET | Freq: Every day | ORAL | 3 refills | Status: AC

## 2024-11-04 NOTE — Patient Instructions (Signed)
 We have sent the following medications to your pharmacy for you to pick up at your convenience: Protonix  40 mg  Please go to the lab in the basement of our building to have lab work done as you leave today. Hit B for basement when you get on the elevator.  When the doors open the lab is on your left.  We will call you with the results. Thank you.  Stop Nexium   Use a fiber supplement to help regulate bowel movements  Use Imodium  as needed  Call if you have new or worsening symptoms  _______________________________________________________  If your blood pressure at your visit was 140/90 or greater, please contact your primary care physician to follow up on this.  _______________________________________________________  If you are age 80 or older, your body mass index should be between 23-30. Your Body mass index is 38.86 kg/m. If this is out of the aforementioned range listed, please consider follow up with your Primary Care Provider.  If you are age 80 or younger, your body mass index should be between 19-25. Your Body mass index is 38.86 kg/m. If this is out of the aformentioned range listed, please consider follow up with your Primary Care Provider.   ________________________________________________________  The [facility] providers would like to encourage you to use MYCHART to communicate with providers for non-urgent requests or questions.  Due to long hold times on the telephone, sending your provider a message by [facility] may be a faster and more efficient way to get a response.  Please allow 48 business hours for a response.  Please remember that this is for non-urgent requests.  _______________________________________________________  [facility] is using a team-based approach to care.  Your team is made up of your doctor and two to three APPS. Our APPS (Nurse Practitioners and Physician Assistants) work with your physician to ensure care continuity for you. They  are fully qualified to address your health concerns and develop a treatment plan. They communicate directly with your gastroenterologist to care for you. Seeing the Advanced Practice Practitioners on your physician's team can help you by facilitating care more promptly, often allowing for earlier appointments, access to diagnostic testing, procedures, and other specialty referrals.   Due to recent changes in healthcare laws, you may see the results of your imaging and laboratory studies on MyChart before your provider has had a chance to review them.  We understand that in some cases there may be results that are confusing or concerning to you. Not all laboratory results come back in the same time frame and the provider may be waiting for multiple results in order to interpret others.  Please give us  48 hours in order for your provider to thoroughly review all the results before contacting the office for clarification of your results.

## 2024-11-04 NOTE — Progress Notes (Signed)
 "  [name] [id number] 12/08/1945   Chief Complaint: Crohn's disease, diarrhea, heartburn  Referring Provider: Domenica Harlene LABOR, MD Primary GI MD: Dr. Avram  HPI: [name] is a 79 y.o. female with past medical history of anxiety/depression, CHF, A-fib on warfarin, anemia, skin cancer, CAD, CKD stage IIIa, s/p right nephrectomy, Crohn's colitis, diverticulosis, HTN, GERD, HLD, OSA, osteoporosis, obesity, prior pancreatitis, type 1 diabetes who presents today for a complaint of diarrhea and GERD.    Patient last seen in office 04/17/2024 by Dr. Avram for rectal bleeding.  History of Crohn's colitis and adenomatous colon polyps.  Had previously been seen in 2023 with heme positive stool but had declined a colonoscopy.  Patient is on Remicade  for rheumatoid arthritis. It was suspected that her bleeding was most likely hemorrhoidal triggered by transient constipation, but with history of Crohn's and colon polyps colorectal cancer was possible.  Patient declined a colonoscopy and was aware of possibly missing cancer.  She was prescribed Anusol  suppositories. Labs at that time showed mildly low hemoglobin, normal B12.  Patient admitted to the hospital 09/12/2024 to 09/18/2024 for acute on chronic CHF.  Patient presented with dyspnea and uncontrolled hypertension for several days prior to admission.  Chest radiograph showed mild cardiomegaly, bilateral hilar vascular congestion, and small bilateral pleural effusions.  High sensitive troponin 26, BNP 1773.  Chest CT with no evidence of aortic dissection or aneurysm.  Had small to moderate-sized bilateral pleural effusions with compressive atelectasis in the lower lobes, mediastinal lymphadenopathy, small amount of air in the bladder, colonic diverticulosis. Patient was placed on IV furosemide  for diuresis, echocardiogram showed preserved LV systolic function.  During admission patient noted to have chronic diarrhea but no signs of acute  flare.  Plan was to continue outpatient management.  She is on infliximab  and methotrexate.  Stool panel tested positive for Salmonella, and considering patient's clinical picture and overall improvement, plan was to hold on antibiotic therapy.  This was discussed with GI and plan was to follow-up outpatient.  Per note from Vina Dasen, NP 09/17/2024, recommended checking for C. difficile and if negative okay for discharge, order fecal calprotectin instead of lactoferrin, start Imodium  and daily fiber supplement for chronic diarrhea, follow-up outpatient.  Fecal calprotectin was elevated at 671 and patient with known Crohn's disease but also with current Salmonella infection at that time.   Discussed the use of AI scribe software for clinical note transcription with the patient, who gave verbal consent to proceed.  History of Present Illness [name] is a 79 year old female with Crohn's disease, rheumatoid arthritis, congestive heart failure, and recent salmonella enteritis who presents for evaluation of diarrhea and heartburn.  Diarrhea and Bowel Habit Changes: - Increased bowel movement frequency, currently approximately five per day - Initial stools are formed, with subsequent stools becoming progressively looser - No watery or bloody diarrhea - Occasional abdominal pain associated with bowel movements, resolving afterwards - Dark green stools present, without melena - Previously had three bowel movements per day, especially while on Remicade  - No recent constipation - Uses nightly Citrucel fiber supplement; allergic to psyllium - Occasionally uses [employer] brand anti-diarrheal medication as needed - Recent hospitalization revealed salmonella on stool testing and elevated fecal calprotectin  Gastroesophageal Reflux Symptoms: - Ongoing heartburn - Currently taking Protonix  20 mg daily, started during recent hospitalization - Also taking over-the-counter Nexium  20 mg twice  daily, but no active prescription - No dysphagia, food impaction, or family history of esophageal  or gastric cancer  Anorectal Symptoms: - Internal hemorrhoids and a small skin tag identified on anoscopy and rectal exam in June 2025 - Suppositories provided for hemorrhoids if needed  Abdominal and Renal Symptoms: - Pain in the kidney area - No dysuria, cloudy urine, or history of kidney stones - History of right nephrectomy  Congestive Heart Failure: - Managed with diuretics, which control leg swelling - Monitors weight daily without unintentional weight loss - Appetite is normal  Rheumatologic Disease: - Rheumatoid arthritis and psoriatic arthritis present - Worsening psoriasis on the face and ankles - No recent rheumatology follow-up since hospitalization but plans to do so - Continues methotrexate; Remicade  discontinued due to congestive heart failure - No fever, chills, nausea, vomiting, or new joint pain  Prior Endoscopic Evaluation: - No colonoscopy since 2013 - Upper endoscopy performed in 2008    Previous GI Procedures/Imaging   CT angio chest abdomen pelvis 09/12/2024 IMPRESSION: 1. No evidence for aortic dissection or aneurysm. 2. Small to moderate-sized bilateral pleural effusions with compressive atelectasis in the lower lobes. 3. Mild cardiomegaly. 4. Mediastinal lymphadenopathy, increased from prior. Findings may be reactive, but metastatic disease can not be excluded. 5. Small amount of air in the bladder. 6. Colonic diverticulosis. 7. Aortic atherosclerosis.  Colonoscopy 09/10/2012 1.  3 diminutive sessile polyps were found in the transverse colon and descending colon, polypectomy was performed with cold forceps and with a cold snare 2.  Severe diverticulosis was noted in the left colon 3.  Small medium internal hemorrhoids 4.  The colon mucosa was otherwise normal with excellent prep, Crohn's appears in remission  2 year recall  recommended  Path: 1. Surgical Surgical [P], descending, descending, transverse, transverse, polyps - TUBULAR TUBULAR ADENOMAS. ADENOMAS. NO HIGH GRADE DYSPLASIA DYSPLASIA OR INVASIVE INVASIVE MALIGNANCY MALIGNANCY IDENTIFIED. IDENTIFIED. 2. Surgical Surgical [P], cecum to hepatic hepatic flexure, flexure, bx - BENIGN COLONIC COLONIC MUCOSA. MUCOSA. NO ACTIVE COLITIS, COLITIS, GRANULOMATA GRANULOMATA OR ADENOMATOUS ADENOMATOUS EPITHELIUM EPITHELIUM IDENTIFIED. IDENTIFIED. 3. Surgical Surgical [P], transverse, transverse, splenic splenic and descending, descending, polyp - BENIGN COLONIC COLONIC MUCOSA WITH LYMPHOID LYMPHOID AGGREGATES. AGGREGATES. NO ACTIVE INFLAMMATION, INFLAMMATION, GRANULOMATA GRANULOMATA OR ADENOMATOUS ADENOMATOUS EPITHELIUM EPITHELIUM IDENTIFIED. IDENTIFIED. 4. Surgical Surgical [P], descending descending to rectum, rectum, bx - BENIGN COLORECTAL COLORECTAL MUCOSA WITH SMALL FOCI OF HYPERPLASTIC HYPERPLASTIC EPITHELIAL EPITHELIAL CHANGES. CHANGES. NO ACTIVE INFLAMMATION, INFLAMMATION, GRANULOMATA GRANULOMATA OR ADENOMATOUS ADENOMATOUS EPITHELIUM EPITHELIUM IDENTIFIED.     Past Medical History:  Diagnosis Date   Abnormal gait 08/18/2021   Acute midline thoracic back pain 09/07/2023   Acute on chronic diastolic CHF (congestive heart failure) (HCC) 09/13/2024   Acute respiratory failure with hypoxia (HCC) 09/12/2024   Afib (HCC) 01/15/2009   Qualifier: History of   By: Georgian ROSALEA CHARM Lamar        Allergic rhinitis 09/06/2007   Qualifier: Diagnosis of   By: Georgian ROSALEA CHARM Lamar     IMO SNOMED Dx Update Oct 2024     Allergy    Anemia 06/09/2024   Anxiety    Arthritis    Basal cell carcinoma 02/07/2012   Follows with [facility], Dr Dale     Bilateral leg edema 09/12/2012   Blepharitis 11/17/2019   Blood transfusion without reported diagnosis    CAD (coronary artery disease)    Cerebrovascular disease 07/30/2014   Chest pain 09/11/2023   Chronic kidney disease, stage 3a  (HCC) 09/18/2024   Coronary atherosclerosis 09/06/2007   Centricity Description: CORONARY ARTERY DISEASE  Qualifier: Diagnosis of  By: Anice CMA, Darlene      Centricity Description: CAD, UNSPECIFIED SITE  Qualifier: Diagnosis of   By: Pietro, MD, CODY Redell Dimes        COVID-19 08/28/2023   Crohn's colitis [facility])    she reports ulcerative colitis beginning in her 65's, biopsies 2008 suggest Crohn's not UC   Crohn's disease (HCC) 08/18/2021   Depression    DEPRESSION/ANXIETY 09/06/2007   Qualifier: Diagnosis of   By: Anice CMA, Darlene         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   Qualifier: Diagnosis of   By: Georgian ROSALEA CHARM Lamar  [phone]  10/2006 had a right kidney removed for a possible infection? Lesion?  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   Headache 02/24/2009   Qualifier: Diagnosis of   By: Georgian ROSALEA CHARM Lamar     IMO SNOMED Dx Update Oct 2024     Hearing loss 09/06/2007   Qualifier: Diagnosis of   By: Anice CMA, Darlene         Hip pain, chronic, left 12/03/2018   History of colonic polyps 07/04/2007   2 DIMINUTIVE SIGMOID ADENOMAS 9/08  08/2012 - 3 diminutive adenomas     IMO SNOMED Dx Update Oct 2024     History of malignant neoplasm of skin 08/18/2021   Hyperglycemia 07/02/2009   Qualifier: Diagnosis of   By: Georgian ROSALEA CHARM Lamar        Hyperlipidemia    Hypertension    Hypertensive urgency 09/13/2024   IBS (irritable bowel syndrome)    Intertrigo 11/12/2013   Keloid of skin    Low back pain 01/07/2009   Qualifier: Diagnosis of   By: Avram MD, NOLIA Pitts E        Maxillary sinus cyst 02/07/2012   Medicare annual wellness visit, subsequent 08/17/2012   Sees Dr Pietro of  cardiology  Sees [facility] pul[facility]ology  [facility] Dale of [facility]  Sees Dr Avram, [facility], last colonoscopy 09/10/12, will have repeat colonoscopy soon     Morbid obesity (HCC) 09/25/2023   MYOCARDIAL INFARCTION, HX OF 09/06/2007   Qualifier: Diagnosis of   By: Anice CMA, Darlene         Obesity, class 3 (HCC) 01/03/2008   Qualifier: Diagnosis of   By: Derrek Ramp         Obesity, Class III, BMI 40-49.9 (morbid obesity) (HCC) 09/13/2024   Onychomycosis 06/03/2018   OSA (obstructive sleep apnea)    Osteoporosis 11/05/2007            Other acne 04/21/2014   Other specified disorders of kidney and ureter 09/07/2023   Pain in joint of right shoulder 04/21/2014   Pancreatitis    Paroxysmal atrial fibrillation (HCC)    Pseudoaneurysm of right femoral artery    Psoriasis 08/04/2021   Radicular low back pain 06/28/2012   L1-2: No impingement. Mild disc bulge.   L2-3: Borderline right foraminal stenosis due to disc bulge.   L3-4: Mild left foraminal stenosis due to disc bulge,   intervertebral spurring, and facet arthropathy.   L4-5: Mild right and borderline left foraminal  stenosis with mild   bilateral subarticular lateral recess stenosis and borderline   central stenosis due to facet arthropathy and disc bulge.    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   Shoulder pain 06/09/2024   Sinusitis 06/30/2014   Sleep apnea    Statin intolerance 02/28/2011   Type 1 diabetes mellitus with hyperglycemia (HCC) 09/24/2024   UC (ulcerative colitis) (HCC)    Uncontrolled hypertension 09/10/2024   Uveitis 08/18/2021   Vitamin D  deficiency 08/04/2021    Past Surgical History:  Procedure Laterality Date   ANGIOPLASTY     BREAST BIOPSY  10/24/1994   benign lesion    Cataract surgery Left 11/13/2014   CHOLECYSTECTOMY  10/24/2006    COLONOSCOPY W/ BIOPSIES     CORONARY ANGIOPLASTY     ESOPHAGOGASTRODUODENOSCOPY     EYE SURGERY     NEPHRECTOMY  10/24/2006   right secondary to oncocytoma    RIGHT FEMORAL  PSEUDOANEURYSM & RIGHT GROIN HEMATOMA EVACUATION  02/22/2007   RIGHT RENAL ARTERY REPAIR  10/24/1974   TUBAL LIGATION      Current Outpatient Medications  Medication Sig Dispense Refill   carvedilol  (COREG ) 6.25 MG tablet Take 1 tablet (6.25 mg total) by mouth 2 (two) times daily with a meal. 180 tablet 3   clobetasol ointment (TEMOVATE) 0.05 % Apply 1 Application topically 2 (two) times daily.     dextrose 5 % SOLN 50 mL with methotrexate 25 MG/ML (PF) SOLN 40 mg/m2 Inject 40 mg/m2 into the vein once a week. Every wednesday     dofetilide  (TIKOSYN ) 125 MCG capsule Take 2 capsules (250 mcg total) by mouth 2 (two) times daily.     escitalopram  (LEXAPRO ) 10 MG tablet Take 1 tablet (10 mg total) by mouth daily. 90 tablet 1   esomeprazole  (NEXIUM ) 40 MG capsule Take 1 capsule (40 mg total) by mouth daily. Take in the morning before breakfast. 30 capsule 11   esomeprazole  (NEXIUM ) 40 MG capsule Take 40 mg by mouth daily at 12 noon.     folic acid  (FOLVITE ) 1 MG tablet Take 2 mg by mouth daily.     hydrocortisone  (ANUSOL -HC) 25 MG suppository Place 1 suppository (25 mg total) rectally 2 (two) times daily as needed for hemorrhoids or anal itching. 12 suppository 1   lidocaine -diphenhydrAMINE -nystatin -hydrocortisone  Swish and spit 5 mLs by mouth 4 (four) times daily as needed for mouth pain. 240 mL 0   magic mouthwash w/lidocaine  SOLN Take 5 mLs by mouth 4 (four) times daily as needed for mouth pain. Swish and Spit 240 mL 0   Methotrexate Sodium (METHOTREXATE, PF,) 50 MG/2ML injection Inject 2 mLs into the muscle once a week.     Multiple Vitamin (MULTIVITAMIN) capsule Take 1 capsule by mouth daily. 1 daily     nitroGLYCERIN  (NITROSTAT ) 0.4 MG SL tablet Place 1 tablet (0.4 mg total) under the tongue every 5 (five) minutes as  needed for chest pain. Do not exceed 3 tabs in 15 minutes 75 tablet 3   nystatin -triamcinolone  ointment (MYCOLOG) Apply 1 Application topically 2 (two) times daily. 30 g 0   OXYGEN  Inhale 2 L/min into the lungs at bedtime.     pantoprazole  (PROTONIX ) 20 MG tablet Take 1 tablet (20 mg total) by mouth daily. 90 tablet 1   Probiotic Product (PHILLIPS COLON HEALTH) CAPS Take 1 capsule by mouth daily.  1 per day     Skin Protectants, Misc. (WHITE PETROLATUM-ZINC) 58.3 % cream Apply topically 3 (three) times daily as needed. 60 g 0   spironolactone  (ALDACTONE ) 25 MG tablet Take 0.5 tablets (12.5 mg total) by mouth daily. 30 tablet 2   torsemide  (DEMADEX ) 20 MG tablet Take 1 tablet (20 mg total) by mouth 2 (two) times daily. 180 tablet 0   TUBERCULIN SYR 1CC/27GX1/2 27G X 1/2 1 ML MISC      valsartan  (DIOVAN ) 160 MG tablet TAKE 1 AND 1/2 TABLETS ONCE DAILY 135 tablet 3   warfarin (COUMADIN ) 2.5 MG tablet TAKE 1/2 TABLET TO 1 TABLET BY MOUTH DAILY AS DIRECTED BY COUMADIN  CLINIC 75 tablet 1   No current facility-administered medications for this visit.    Allergies as of 11/04/2024 - Review Complete 11/04/2024  Allergen Reaction Noted   Hydralazine  Diarrhea and Nausea Only 01/31/2018   Cefdinir   06/04/2012   Codeine  09/06/2007   Fosamax [alendronate sodium] Other (See Comments) 08/03/2021   Gluten meal  09/11/2023   Guaifenesin er Nausea And Vomiting and Other (See Comments) 07/08/2011   Latex Other (See Comments) 06/29/2021   Percocet [oxycodone -acetaminophen ]  06/22/2012   Psyllium Other (See Comments) 07/08/2011    Family History  Problem Relation Age of Onset   Hypertension Mother    Coronary artery disease Mother    Stroke Mother    Clotting disorder Mother    Heart disease Mother    Allergies Mother    Colitis Father    Heart disease Father    Clotting disorder Sister    Stroke Brother    Clotting disorder Brother    Cancer Daughter    Asthma Daughter    Asthma Maternal  Uncle    Colon cancer Paternal Aunt    Cancer Daughter    Colon polyps Neg Hx    Kidney disease Neg Hx    Stomach cancer Neg Hx    Esophageal cancer Neg Hx     Social History[1]   Review of Systems:    Constitutional: No weight loss, fever, chills Cardiovascular: No chest pain  Respiratory: No SOB  Gastrointestinal: See HPI and otherwise negative    Physical Exam:  Vital signs: BP 124/80 (BP Location: Left Arm, Patient Position: Sitting, Cuff Size: Large)   Pulse 67   Ht 5' 3 (1.6 m)   Wt 219 lb 6 oz (99.5 kg)   SpO2 99%   BMI 38.86 kg/m   Wt Readings from Last 3 Encounters:  11/04/24 219 lb 6 oz (99.5 kg)  10/21/24 220 lb (99.8 kg)  10/18/24 219 lb 3.2 oz (99.4 kg)     Constitutional: Pleasant, obese female in NAD, alert and cooperative, ambulates with a walker Head:  Normocephalic and atraumatic.  Respiratory: Respirations even and unlabored. Lungs clear to auscultation bilaterally.  No wheezes, crackles, or rhonchi.  Cardiovascular:  Regular rate and rhythm. No murmurs. No peripheral edema. Gastrointestinal:  Soft, nondistended, nontender. No rebound or guarding. Normal bowel sounds. No appreciable masses or hepatomegaly. Rectal:  Not performed.  Neurologic:  Alert and oriented x4;  grossly normal neurologically.  Skin:   Dry and intact without significant lesions or rashes. Psychiatric: Oriented to person, place and time. Demonstrates good judgement and reason without abnormal affect or behaviors.   Echocardiogram 09/13/2024 1. Left ventricular ejection fraction, by estimation, is 65 to 70% . The left ventricle has normal function. The left ventricle has no regional wall motion abnormalities. There is mild  left ventricular hypertrophy. Left ventricular diastolic parameters were normal.  2. Right ventricular systolic function is normal. The right ventricular size is normal.  3. Left atrial size was moderately dilated.  4. The mitral valve is abnormal. Trivial  mitral valve regurgitation. No evidence of mitral stenosis.  5. The aortic valve is tricuspid. There is moderate calcification of the aortic valve. Aortic valve regurgitation is not visualized. Aortic valve sclerosis is present, with no evidence of aortic valve stenosis.  6. The inferior vena cava is normal in size with greater than 50% respiratory variability, suggesting right atrial pressure of 3 mmHg.   Assessment/Plan:   Assessment & Plan Crohn's disease with diarrhea, possible flare versus post-infectious changes Subacute increased stool frequency and mild abdominal pain suggestive of Crohn's flare or post-infectious changes.  Declines repeat colonoscopy.  Had previously been on Remicade  for rheumatoid arthritis but this has been discontinued based on her other medical conditions.  Currently having 4-5 bowel movements daily.  First 2-3 bowel movements of the day tend to be formed, getting progressively looser throughout the day.  Denies any watery diarrhea, melena, hematochezia.  Uses OTC antidiarrheal occasionally as needed.  Has taken fiber supplement in the past, Citrucel, plans to continue this.  - CBC, CMP, ESR, CRP - Repeat fecal calprotectin, stool culture and C. Diff - Citrucel fiber supplement recommended as tolerated. - Imodium  advised as needed for significant diarrhea, with caution to avoid constipation. - Follow up with Dr. Avram to discuss further management of Crohn's disease.  - Follow-up with rheumatology for arthritis management  - Patient declines future repeat colonoscopy  Gastroesophageal reflux disease Persistent chronic heartburn without alarm symptoms.  Has recently been prescribed Protonix  20 mg daily, has also been taking Nexium  20 mg twice daily OTC.  - Stop Nexium  -Take Protonix  40 mg daily - Advised that if heartburn persists, may increase Protonix  to twice daily. - Discussed upper endoscopy as a consideration if symptoms persist, with esophageal imaging  as an alternative.  Patient would like to avoid endoscopic evaluation. - Reevaluate symptoms at follow up  History of salmonella enteritis Recent salmonella infection managed conservatively without antibiotics; unclear if current symptoms are due to residual infection or post-infectious changes.  - Repeat stool culture ordered      Camie Furbish, PA-C [facility] 11/04/2024, 11:08 AM  Patient Care Team: Domenica Harlene LABOR, MD as PCP - General (Family Medicine) Pietro Redell RAMAN, MD as PCP - Cardiology (Cardiology) Inocencio Soyla Lunger, MD as PCP - Electrophysiology (Cardiology) Avram Lupita BRAVO, MD as Consulting Physician (Gastroenterology) Jude Harden GAILS, MD as Consulting Physician (Pulmonary Disease) Mai Lynwood FALCON, MD as Consulting Physician (Rheumatology) Haverstock, Tawni CROME, MD as Referring Physician (Dermatology) Loel Awanda BIRCH, DPM as Consulting Physician (Podiatry)       [1]  Social History Tobacco Use   Smoking status: Never   Smokeless tobacco: Never   Tobacco comments:    Never smoked 03/06/24  Vaping Use   Vaping status: Never Used  Substance Use Topics   Alcohol use: No   Drug use: No   "

## 2024-11-05 ENCOUNTER — Other Ambulatory Visit

## 2024-11-05 DIAGNOSIS — K219 Gastro-esophageal reflux disease without esophagitis: Secondary | ICD-10-CM

## 2024-11-05 DIAGNOSIS — K5010 Crohn's disease of large intestine without complications: Secondary | ICD-10-CM

## 2024-11-05 DIAGNOSIS — R194 Change in bowel habit: Secondary | ICD-10-CM

## 2024-11-05 DIAGNOSIS — R195 Other fecal abnormalities: Secondary | ICD-10-CM

## 2024-11-05 DIAGNOSIS — Z8619 Personal history of other infectious and parasitic diseases: Secondary | ICD-10-CM

## 2024-11-05 DIAGNOSIS — R1084 Generalized abdominal pain: Secondary | ICD-10-CM

## 2024-11-08 LAB — GI PROFILE, STOOL, PCR
Adenovirus F 40/41: NOT DETECTED
Astrovirus: NOT DETECTED
C difficile toxin A/B: NOT DETECTED
Campylobacter: NOT DETECTED
Cryptosporidium: NOT DETECTED
Cyclospora cayetanensis: NOT DETECTED
Entamoeba histolytica: NOT DETECTED
Enteroaggregative E coli: NOT DETECTED
Enteropathogenic E coli: NOT DETECTED
Enterotoxigenic E coli: NOT DETECTED
Giardia lamblia: NOT DETECTED
Norovirus GI/GII: NOT DETECTED
Plesiomonas shigelloides: NOT DETECTED
Rotavirus A: NOT DETECTED
Salmonella: DETECTED — AB
Sapovirus: NOT DETECTED
Shiga-toxin-producing E coli: NOT DETECTED
Shigella/Enteroinvasive E coli: NOT DETECTED
Vibrio cholerae: NOT DETECTED
Vibrio: NOT DETECTED
Yersinia enterocolitica: NOT DETECTED

## 2024-11-08 LAB — CALPROTECTIN, FECAL: Calprotectin, Fecal: 159 ug/g — AB (ref 0–120)

## 2024-11-09 LAB — STOOL CULTURE: E coli, Shiga toxin Assay: NEGATIVE

## 2024-11-12 ENCOUNTER — Ambulatory Visit: Attending: Cardiology

## 2024-11-12 DIAGNOSIS — Z5189 Encounter for other specified aftercare: Secondary | ICD-10-CM | POA: Insufficient documentation

## 2024-11-12 DIAGNOSIS — I4891 Unspecified atrial fibrillation: Secondary | ICD-10-CM | POA: Diagnosis present

## 2024-11-12 DIAGNOSIS — Z5181 Encounter for therapeutic drug level monitoring: Secondary | ICD-10-CM | POA: Diagnosis present

## 2024-11-12 LAB — POCT INR: INR: 3.6 — AB (ref 2.0–3.0)

## 2024-11-12 NOTE — Patient Instructions (Signed)
 Hold today only then Continue taking  2.5mg  daily EXCEPT 1.25mg  on Sunday, Tuesdays, and Thursdays.  Eat greens tonight. Stay consistent with green each week  (2 per week)  Repeat INR in 6 weeks.  Coumadin  Clinic [phone]

## 2024-11-13 ENCOUNTER — Encounter: Payer: Self-pay | Admitting: Family Medicine

## 2024-11-14 ENCOUNTER — Other Ambulatory Visit: Payer: Self-pay | Admitting: Cardiology

## 2024-11-14 ENCOUNTER — Institutional Professional Consult (permissible substitution) (HOSPITAL_BASED_OUTPATIENT_CLINIC_OR_DEPARTMENT_OTHER): Admitting: Family

## 2024-11-20 NOTE — Telephone Encounter (Signed)
 Labs on 06/13/24 Outside of Normal  In accordance with refill protocols, please review and address the following requirements before this medication refill can be authorized:  Labs

## 2024-11-25 ENCOUNTER — Ambulatory Visit: Admitting: Cardiology

## 2024-11-26 ENCOUNTER — Encounter (HOSPITAL_BASED_OUTPATIENT_CLINIC_OR_DEPARTMENT_OTHER): Payer: Self-pay | Admitting: Adult Health

## 2024-11-27 ENCOUNTER — Telehealth: Payer: Self-pay

## 2024-11-27 NOTE — Telephone Encounter (Signed)
 Copied from CRM [id number]. Topic: Clinical - Home Health Verbal Orders >> Nov 27, 2024  3:03 PM Sasha M wrote: Caller/Agency: [facility] Callback Number: [phone] secured vm Service Requested: Skilled Nursing Frequency: 1 for 4 weeks, 1 every other week for 4 weeks and 2 as needed for changing conditions Dealing with reflux and gastro pain Any new concerns about the patient? No

## 2024-11-27 NOTE — Telephone Encounter (Addendum)
Spoke w/ Sonia Baller- verbal orders given.

## 2024-11-27 NOTE — Telephone Encounter (Signed)
 Okay to give verbal orders?  ?

## 2024-12-09 ENCOUNTER — Ambulatory Visit: Admitting: Family Medicine

## 2024-12-12 ENCOUNTER — Ambulatory Visit (HOSPITAL_COMMUNITY): Admitting: Physician Assistant

## 2024-12-18 ENCOUNTER — Ambulatory Visit: Admitting: Internal Medicine

## 2024-12-24 ENCOUNTER — Ambulatory Visit

## 2025-01-01 ENCOUNTER — Ambulatory Visit: Admitting: Podiatry

## 2025-01-03 ENCOUNTER — Ambulatory Visit: Admitting: Internal Medicine

## 2025-01-14 ENCOUNTER — Ambulatory Visit: Admitting: Cardiology

## 2025-02-17 ENCOUNTER — Ambulatory Visit: Admitting: Family Medicine

## 2025-06-17 ENCOUNTER — Ambulatory Visit
